# Patient Record
Sex: Male | Born: 1935 | Race: White | Hispanic: No | Smoking: Former smoker
Health system: Southern US, Community
[De-identification: ages and names within clinical notes are randomized; demographics above are authoritative.]

## PROBLEM LIST (undated history)

## (undated) DIAGNOSIS — Z85828 Personal history of other malignant neoplasm of skin: Secondary | ICD-10-CM

## (undated) DIAGNOSIS — C61 Malignant neoplasm of prostate: Secondary | ICD-10-CM

## (undated) DIAGNOSIS — C4492 Squamous cell carcinoma of skin, unspecified: Secondary | ICD-10-CM

## (undated) DIAGNOSIS — Z9889 Other specified postprocedural states: Secondary | ICD-10-CM

## (undated) DIAGNOSIS — N529 Male erectile dysfunction, unspecified: Secondary | ICD-10-CM

## (undated) DIAGNOSIS — I10 Essential (primary) hypertension: Secondary | ICD-10-CM

## (undated) DIAGNOSIS — K219 Gastro-esophageal reflux disease without esophagitis: Secondary | ICD-10-CM

## (undated) DIAGNOSIS — Z8679 Personal history of other diseases of the circulatory system: Secondary | ICD-10-CM

## (undated) DIAGNOSIS — C4491 Basal cell carcinoma of skin, unspecified: Secondary | ICD-10-CM

## (undated) HISTORY — DX: Squamous cell carcinoma of skin, unspecified: C44.92

## (undated) HISTORY — PX: CATARACT EXTRACTION W/ INTRAOCULAR LENS  IMPLANT, BILATERAL: SHX1307

## (undated) HISTORY — DX: Gastro-esophageal reflux disease without esophagitis: K21.9

## (undated) HISTORY — DX: Essential (primary) hypertension: I10

## (undated) HISTORY — PX: APPENDECTOMY: SHX54

## (undated) HISTORY — DX: Basal cell carcinoma of skin, unspecified: C44.91

---

## 1978-07-15 HISTORY — PX: CHOLECYSTECTOMY: SHX55

## 1998-03-06 ENCOUNTER — Encounter: Admission: RE | Admit: 1998-03-06 | Discharge: 1998-06-04 | Payer: Self-pay | Admitting: Internal Medicine

## 1998-03-28 ENCOUNTER — Ambulatory Visit (HOSPITAL_COMMUNITY): Admission: RE | Admit: 1998-03-28 | Discharge: 1998-03-28 | Payer: Self-pay | Admitting: Ophthalmology

## 2001-02-11 ENCOUNTER — Other Ambulatory Visit: Admission: RE | Admit: 2001-02-11 | Discharge: 2001-02-11 | Payer: Self-pay | Admitting: Gastroenterology

## 2001-02-11 ENCOUNTER — Encounter (INDEPENDENT_AMBULATORY_CARE_PROVIDER_SITE_OTHER): Payer: Self-pay | Admitting: Specialist

## 2004-05-23 ENCOUNTER — Ambulatory Visit: Payer: Self-pay | Admitting: Internal Medicine

## 2004-05-30 ENCOUNTER — Ambulatory Visit: Payer: Self-pay | Admitting: Internal Medicine

## 2004-11-14 ENCOUNTER — Ambulatory Visit: Payer: Self-pay | Admitting: Internal Medicine

## 2005-02-14 ENCOUNTER — Ambulatory Visit: Payer: Self-pay | Admitting: Internal Medicine

## 2005-04-18 ENCOUNTER — Ambulatory Visit: Payer: Self-pay | Admitting: Internal Medicine

## 2005-04-26 ENCOUNTER — Ambulatory Visit: Payer: Self-pay | Admitting: Internal Medicine

## 2005-08-26 ENCOUNTER — Ambulatory Visit: Payer: Self-pay | Admitting: Internal Medicine

## 2005-11-20 ENCOUNTER — Ambulatory Visit: Payer: Self-pay | Admitting: Internal Medicine

## 2006-01-30 ENCOUNTER — Ambulatory Visit: Payer: Self-pay | Admitting: Internal Medicine

## 2006-04-23 ENCOUNTER — Ambulatory Visit: Payer: Self-pay | Admitting: Gastroenterology

## 2006-05-08 ENCOUNTER — Ambulatory Visit: Payer: Self-pay | Admitting: Internal Medicine

## 2006-05-09 ENCOUNTER — Ambulatory Visit: Payer: Self-pay | Admitting: Gastroenterology

## 2006-08-14 ENCOUNTER — Ambulatory Visit: Payer: Self-pay | Admitting: Internal Medicine

## 2006-08-14 LAB — CONVERTED CEMR LAB
ALT: 23 units/L (ref 0–40)
AST: 22 units/L (ref 0–37)
Albumin: 3.8 g/dL (ref 3.5–5.2)
Calcium: 8.9 mg/dL (ref 8.4–10.5)
Chloride: 105 meq/L (ref 96–112)
Cholesterol: 189 mg/dL (ref 0–200)
GFR calc non Af Amer: 64 mL/min
LDL Cholesterol: 128 mg/dL — ABNORMAL HIGH (ref 0–99)
VLDL: 28 mg/dL (ref 0–40)

## 2007-01-30 DIAGNOSIS — I10 Essential (primary) hypertension: Secondary | ICD-10-CM | POA: Insufficient documentation

## 2007-02-11 ENCOUNTER — Ambulatory Visit: Payer: Self-pay | Admitting: Internal Medicine

## 2007-02-11 LAB — CONVERTED CEMR LAB: Hgb A1c MFr Bld: 6.8 % — ABNORMAL HIGH (ref 4.6–6.0)

## 2007-06-04 ENCOUNTER — Ambulatory Visit: Payer: Self-pay | Admitting: Internal Medicine

## 2007-07-15 ENCOUNTER — Ambulatory Visit: Payer: Self-pay | Admitting: Internal Medicine

## 2007-07-15 LAB — CONVERTED CEMR LAB
ALT: 22 units/L (ref 0–53)
Bilirubin, Direct: 0.2 mg/dL (ref 0.0–0.3)
Calcium: 9 mg/dL (ref 8.4–10.5)
Creatinine,U: 104.9 mg/dL
Eosinophils Absolute: 0.7 10*3/uL — ABNORMAL HIGH (ref 0.0–0.6)
Eosinophils Relative: 7.9 % — ABNORMAL HIGH (ref 0.0–5.0)
GFR calc Af Amer: 77 mL/min
GFR calc non Af Amer: 63 mL/min
Glucose, Bld: 157 mg/dL — ABNORMAL HIGH (ref 70–99)
Glucose, Urine, Semiquant: NEGATIVE
HDL: 27.6 mg/dL — ABNORMAL LOW (ref >39.0)
Hgb A1c MFr Bld: 7.1 % — ABNORMAL HIGH (ref 4.6–6.0)
Lymphocytes Relative: 22.8 % (ref 12.0–46.0)
MCV: 93.5 fL (ref 78.0–100.0)
Microalb Creat Ratio: 73.4 mg/g — ABNORMAL HIGH (ref 0.0–30.0)
Microalb, Ur: 7.7 mg/dL — ABNORMAL HIGH (ref 0.0–1.9)
Monocytes Relative: 9 % (ref 3.0–11.0)
Neutro Abs: 4.9 10*3/uL (ref 1.4–7.7)
Platelets: 262 10*3/uL (ref 150–400)
Potassium: 4.6 meq/L (ref 3.5–5.1)
Sodium: 142 meq/L (ref 135–145)
Specific Gravity, Urine: 1.02
Triglycerides: 113 mg/dL (ref 0–149)
Urobilinogen, UA: 0.2
WBC: 8.3 10*3/uL (ref 4.5–10.5)
pH: 5.5

## 2007-07-22 ENCOUNTER — Ambulatory Visit: Payer: Self-pay | Admitting: Internal Medicine

## 2007-10-21 ENCOUNTER — Ambulatory Visit: Payer: Self-pay | Admitting: Internal Medicine

## 2007-11-05 ENCOUNTER — Ambulatory Visit: Payer: Self-pay | Admitting: Internal Medicine

## 2007-11-05 DIAGNOSIS — K219 Gastro-esophageal reflux disease without esophagitis: Secondary | ICD-10-CM | POA: Insufficient documentation

## 2007-11-05 DIAGNOSIS — R0789 Other chest pain: Secondary | ICD-10-CM

## 2007-11-17 ENCOUNTER — Encounter: Payer: Self-pay | Admitting: Internal Medicine

## 2007-11-17 ENCOUNTER — Ambulatory Visit: Payer: Self-pay

## 2007-12-11 ENCOUNTER — Ambulatory Visit: Payer: Self-pay | Admitting: Internal Medicine

## 2007-12-11 DIAGNOSIS — M47812 Spondylosis without myelopathy or radiculopathy, cervical region: Secondary | ICD-10-CM | POA: Insufficient documentation

## 2008-01-20 ENCOUNTER — Ambulatory Visit: Payer: Self-pay | Admitting: Internal Medicine

## 2008-01-20 DIAGNOSIS — M25519 Pain in unspecified shoulder: Secondary | ICD-10-CM | POA: Insufficient documentation

## 2008-01-20 LAB — CONVERTED CEMR LAB
BUN: 14 mg/dL (ref 6–23)
CO2: 31 meq/L (ref 19–32)
Calcium: 9 mg/dL (ref 8.4–10.5)
Creatinine, Ser: 1.3 mg/dL (ref 0.4–1.5)
Glucose, Bld: 75 mg/dL (ref 70–99)
Hgb A1c MFr Bld: 6.4 % — ABNORMAL HIGH (ref 4.6–6.0)

## 2008-02-10 ENCOUNTER — Telehealth: Payer: Self-pay | Admitting: Internal Medicine

## 2008-04-21 ENCOUNTER — Ambulatory Visit: Payer: Self-pay | Admitting: Internal Medicine

## 2008-04-21 LAB — CONVERTED CEMR LAB: Hgb A1c MFr Bld: 6.6 % — ABNORMAL HIGH (ref 4.6–6.0)

## 2008-06-22 ENCOUNTER — Telehealth: Payer: Self-pay | Admitting: Internal Medicine

## 2008-06-29 ENCOUNTER — Ambulatory Visit: Payer: Self-pay | Admitting: Internal Medicine

## 2008-07-27 ENCOUNTER — Ambulatory Visit: Payer: Self-pay | Admitting: Internal Medicine

## 2008-07-27 DIAGNOSIS — F0150 Vascular dementia without behavioral disturbance: Secondary | ICD-10-CM | POA: Insufficient documentation

## 2008-08-02 ENCOUNTER — Ambulatory Visit: Payer: Self-pay | Admitting: Internal Medicine

## 2008-08-02 ENCOUNTER — Inpatient Hospital Stay (HOSPITAL_COMMUNITY): Admission: EM | Admit: 2008-08-02 | Discharge: 2008-08-05 | Payer: Self-pay | Admitting: Emergency Medicine

## 2008-08-02 ENCOUNTER — Ambulatory Visit: Payer: Self-pay | Admitting: Cardiovascular Disease

## 2008-08-03 ENCOUNTER — Encounter (INDEPENDENT_AMBULATORY_CARE_PROVIDER_SITE_OTHER): Payer: Self-pay | Admitting: Internal Medicine

## 2008-08-03 ENCOUNTER — Ambulatory Visit: Payer: Self-pay | Admitting: Vascular Surgery

## 2008-08-09 ENCOUNTER — Encounter: Admission: RE | Admit: 2008-08-09 | Discharge: 2008-08-09 | Payer: Self-pay | Admitting: Neurosurgery

## 2008-08-09 ENCOUNTER — Encounter: Payer: Self-pay | Admitting: Internal Medicine

## 2008-08-10 ENCOUNTER — Ambulatory Visit: Payer: Self-pay | Admitting: Internal Medicine

## 2008-08-10 DIAGNOSIS — S065X9A Traumatic subdural hemorrhage with loss of consciousness of unspecified duration, initial encounter: Secondary | ICD-10-CM | POA: Insufficient documentation

## 2008-08-10 DIAGNOSIS — H832X9 Labyrinthine dysfunction, unspecified ear: Secondary | ICD-10-CM | POA: Insufficient documentation

## 2008-09-22 ENCOUNTER — Encounter: Payer: Self-pay | Admitting: Internal Medicine

## 2008-09-22 ENCOUNTER — Encounter: Admission: RE | Admit: 2008-09-22 | Discharge: 2008-09-22 | Payer: Self-pay | Admitting: Neurosurgery

## 2008-10-26 ENCOUNTER — Ambulatory Visit: Payer: Self-pay | Admitting: Internal Medicine

## 2008-10-26 LAB — CONVERTED CEMR LAB
Basophils Relative: 2.1 % (ref 0.0–3.0)
CO2: 31 meq/L (ref 19–32)
Chloride: 108 meq/L (ref 96–112)
Eosinophils Relative: 5.4 % — ABNORMAL HIGH (ref 0.0–5.0)
HCT: 41.4 % (ref 39.0–52.0)
Hgb A1c MFr Bld: 6.7 % — ABNORMAL HIGH (ref 4.6–6.5)
Lymphs Abs: 1.4 10*3/uL (ref 0.7–4.0)
MCV: 90 fL (ref 78.0–100.0)
Monocytes Absolute: 0.5 10*3/uL (ref 0.1–1.0)
Monocytes Relative: 10.4 % (ref 3.0–12.0)
Neutrophils Relative %: 55.2 % (ref 43.0–77.0)
RBC: 4.6 M/uL (ref 4.22–5.81)
Sodium: 144 meq/L (ref 135–145)
WBC: 5.2 10*3/uL (ref 4.5–10.5)

## 2008-11-14 ENCOUNTER — Telehealth: Payer: Self-pay | Admitting: Internal Medicine

## 2008-11-14 ENCOUNTER — Encounter: Admission: RE | Admit: 2008-11-14 | Discharge: 2008-11-24 | Payer: Self-pay | Admitting: Internal Medicine

## 2008-12-27 ENCOUNTER — Encounter: Admission: RE | Admit: 2008-12-27 | Discharge: 2008-12-27 | Payer: Self-pay | Admitting: Neurosurgery

## 2008-12-28 ENCOUNTER — Encounter: Payer: Self-pay | Admitting: Internal Medicine

## 2009-01-18 ENCOUNTER — Ambulatory Visit: Payer: Self-pay | Admitting: Internal Medicine

## 2009-04-27 ENCOUNTER — Ambulatory Visit: Payer: Self-pay | Admitting: Internal Medicine

## 2009-05-03 ENCOUNTER — Ambulatory Visit: Payer: Self-pay | Admitting: Internal Medicine

## 2009-05-03 DIAGNOSIS — M259 Joint disorder, unspecified: Secondary | ICD-10-CM | POA: Insufficient documentation

## 2009-05-03 DIAGNOSIS — C4491 Basal cell carcinoma of skin, unspecified: Secondary | ICD-10-CM

## 2009-09-22 ENCOUNTER — Ambulatory Visit: Payer: Self-pay | Admitting: Internal Medicine

## 2009-09-22 DIAGNOSIS — J189 Pneumonia, unspecified organism: Secondary | ICD-10-CM

## 2009-10-02 ENCOUNTER — Ambulatory Visit: Payer: Self-pay | Admitting: Internal Medicine

## 2009-10-02 LAB — CONVERTED CEMR LAB
CO2: 29 meq/L (ref 19–32)
Calcium: 8.8 mg/dL (ref 8.4–10.5)
Chloride: 106 meq/L (ref 96–112)
Glucose, Bld: 131 mg/dL — ABNORMAL HIGH (ref 70–99)
Sodium: 141 meq/L (ref 135–145)

## 2009-12-25 ENCOUNTER — Ambulatory Visit: Payer: Self-pay | Admitting: Internal Medicine

## 2009-12-25 LAB — CONVERTED CEMR LAB
BUN: 14 mg/dL (ref 6–23)
Basophils Absolute: 0 10*3/uL (ref 0.0–0.1)
Basophils Relative: 0.6 % (ref 0.0–3.0)
CO2: 31 meq/L (ref 19–32)
Eosinophils Absolute: 0.2 10*3/uL (ref 0.0–0.7)
GFR calc non Af Amer: 61.13 mL/min (ref >60)
Glucose, Bld: 131 mg/dL — ABNORMAL HIGH (ref 70–99)
HCT: 44.6 % (ref 39.0–52.0)
Hemoglobin: 15.6 g/dL (ref 13.0–17.0)
Lymphs Abs: 1.7 10*3/uL (ref 0.7–4.0)
MCHC: 34.9 g/dL (ref 30.0–36.0)
MCV: 93.9 fL (ref 78.0–100.0)
Monocytes Absolute: 0.6 10*3/uL (ref 0.1–1.0)
Neutro Abs: 3.5 10*3/uL (ref 1.4–7.7)
Potassium: 4.8 meq/L (ref 3.5–5.1)
RBC: 4.75 M/uL (ref 4.22–5.81)
RDW: 13.7 % (ref 11.5–14.6)
Sodium: 143 meq/L (ref 135–145)

## 2010-01-30 ENCOUNTER — Encounter: Payer: Self-pay | Admitting: Internal Medicine

## 2010-04-30 ENCOUNTER — Ambulatory Visit: Payer: Self-pay | Admitting: Internal Medicine

## 2010-04-30 DIAGNOSIS — R252 Cramp and spasm: Secondary | ICD-10-CM

## 2010-05-14 ENCOUNTER — Encounter: Payer: Self-pay | Admitting: Internal Medicine

## 2010-05-29 ENCOUNTER — Telehealth: Payer: Self-pay | Admitting: Internal Medicine

## 2010-07-31 ENCOUNTER — Ambulatory Visit
Admission: RE | Admit: 2010-07-31 | Discharge: 2010-07-31 | Payer: Self-pay | Source: Home / Self Care | Attending: Internal Medicine | Admitting: Internal Medicine

## 2010-07-31 ENCOUNTER — Other Ambulatory Visit: Payer: Self-pay | Admitting: Internal Medicine

## 2010-07-31 DIAGNOSIS — B351 Tinea unguium: Secondary | ICD-10-CM | POA: Insufficient documentation

## 2010-07-31 DIAGNOSIS — E119 Type 2 diabetes mellitus without complications: Secondary | ICD-10-CM | POA: Insufficient documentation

## 2010-07-31 LAB — BASIC METABOLIC PANEL
BUN: 10 mg/dL (ref 6–23)
CO2: 28 mEq/L (ref 19–32)
Calcium: 9 mg/dL (ref 8.4–10.5)
Chloride: 103 mEq/L (ref 96–112)
Creatinine, Ser: 1.3 mg/dL (ref 0.4–1.5)
GFR: 58.81 mL/min — ABNORMAL LOW (ref >60.00)
Glucose, Bld: 188 mg/dL — ABNORMAL HIGH (ref 70–99)
Potassium: 4.2 mEq/L (ref 3.5–5.1)
Sodium: 139 mEq/L (ref 135–145)

## 2010-07-31 LAB — MICROALBUMIN / CREATININE URINE RATIO
Creatinine,U: 200.6 mg/dL
Microalb Creat Ratio: 15.6 mg/g (ref 0.0–30.0)
Microalb, Ur: 31.3 mg/dL — ABNORMAL HIGH (ref 0.0–1.9)

## 2010-07-31 LAB — HEMOGLOBIN A1C: Hgb A1c MFr Bld: 7.5 % — ABNORMAL HIGH (ref 4.6–6.5)

## 2010-08-05 ENCOUNTER — Encounter: Payer: Self-pay | Admitting: Neurosurgery

## 2010-08-14 NOTE — Assessment & Plan Note (Signed)
Summary: 4 month rov/njr   Vital Signs:  Patient profile:   77 year old male Height:      71 inches Weight:      190 pounds BMI:     26.60 Temp:     98.2 degrees F oral Pulse rate:   72 / minute Resp:     14 per minute BP sitting:   130 / 70  (left arm)  Vitals Entered By: Willy Eddy, LPN (April 30, 2010 11:58 AM) CC: roa- cbg's running around 110, Hypertension Management Is Patient Diabetic? Yes Did you bring your meter with you today? No   Primary Care Provider:  Stacie Glaze MD  CC:  roa- cbg's running around 110 and Hypertension Management.  History of Present Illness: The pts blood glucoses have been in the 110 range and he feels well No chest pains, no swelling of legs the pt has noted cramps in his calves at night that awaken him from sleep his blood pressure is well controlled his potassium is normal no exertional cramping  Hypertension History:      He denies headache, chest pain, palpitations, dyspnea with exertion, orthopnea, PND, peripheral edema, visual symptoms, neurologic problems, syncope, and side effects from treatment.        Positive major cardiovascular risk factors include male age 16 years old or older, diabetes, and hypertension.  Negative major cardiovascular risk factors include non-tobacco-user status.     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) 6)  Memory Loss  (ICD-780.93) 7)  Need Prophylactic Vaccination&inoculation Flu  (ICD-V04.81) 8)  Shoulder Pain, Left, Chronic  (ICD-719.41) 9)  Cervical Spondylosis Without Myelopathy  (ICD-721.0) 10)  Chest Pain, Atypical  (ICD-786.59) 11)  Gerd   (ICD-530.81) 12)  Physical Examination  (ICD-V70.0) 13)  Diabetes Mellitus, Type II  (ICD-250.00) 14)  Family History Diabetes 1st Degree Relative  (ICD-V18.0) 15)  Hypertension  (ICD-401.9)  Current Problems (verified): 1)  Pneumonia, Right Middle Lobe  (ICD-486) 2)  Actinic Keratosis, Forehead, Left  (ICD-702.0) 3)  Unspecified Disorder of Shoulder Joint  (ICD-719.91) 4)  Unspecified Labyrinthine Dysfunction  (ICD-386.50) 5)  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 Once Daily 2)  Adult Aspirin Ec Low Strength 81 Mg  Tbec (Aspirin) .... Once Daily 3)  Altace 2.5 Mg  Caps (Ramipril) .Marland Kitchen.. 1 Twice Daily 4)  Celebrex 200 Mg  Caps (Celecoxib) .... One By Mouth Daily As Needed 5)  Accu-Chek Aviva  Strp (Glucose Blood) .Marland Kitchen.. 1 Two Times A Day 6)  Ranitidine Hcl 300 Mg Caps (Ranitidine Hcl) .Marland Kitchen.. 1 Once Daily 7)  Accu-Chek Soft Touch Lancets  Misc (Lancets) .Marland KitchenMarland KitchenMarland Kitchen  1 Two Times A Day 8)  Delsym 30 Mg/11ml Lqcr (Dextromethorphan Polistirex) .... Two Tsp By Mouth Two Times A Day For The Next Week  Current Medications (verified): 1)  Glucotrol Xl 5 Mg  Tb24 (Glipizide) .... Once Daily 2)  Adult Aspirin Ec Low Strength 81 Mg  Tbec (Aspirin) .... Once Daily 3)  Altace 2.5 Mg  Caps (Ramipril) .Marland Kitchen.. 1 Twice Daily 4)  Celebrex 200 Mg  Caps (Celecoxib) .... One By Mouth Daily As Needed 5)  Accu-Chek Aviva  Strp (Glucose Blood) .Marland Kitchen.. 1 Two Times A Day 6)  Ranitidine Hcl 300 Mg  Caps (Ranitidine Hcl) .Marland Kitchen.. 1 Once Daily 7)  Accu-Chek Soft Touch Lancets  Misc (Lancets) .Marland Kitchen.. 1 Two Times A Day 8)  Delsym 30 Mg/70ml Lqcr (Dextromethorphan Polistirex) .... Two Tsp By Mouth Two Times A Day For The Next Week  Allergies (verified): No Known Drug Allergies  Past History:  Family History: Last updated: 01/30/2007 Family History Diabetes 1st degree relative Family History Lung cancer  Social History: Last updated: 01/30/2007 Former Smoker Alcohol use-no Married  Risk Factors: Exercise: no (07/22/2007)  Risk Factors: Smoking Status: quit (04/30/2010)  Past medical, surgical, family and social histories (including risk factors) reviewed, and no changes noted (except as noted below).  Past Medical History: Reviewed history from 11/05/2007 and no changes required. Hypertension Diabetes mellitus, type II GERD  Past Surgical History: Reviewed history from 01/30/2007 and no changes required. Appendectomy Cholecystectomy Colonoscopy-05/09/2006  Family History: Reviewed history from 01/30/2007 and no changes required. Family History Diabetes 1st degree relative Family History Lung cancer  Social History: Reviewed history from 01/30/2007 and no changes required. Former Smoker Alcohol use-no Married  Review of Systems  The patient denies anorexia, fever, weight loss, weight gain, vision loss, decreased hearing, hoarseness, chest pain, syncope, dyspnea on exertion, peripheral edema, prolonged cough, headaches, hemoptysis, abdominal pain, melena, hematochezia, severe indigestion/heartburn, hematuria, incontinence, genital sores, muscle weakness, suspicious skin lesions, transient blindness, difficulty walking, depression, unusual weight change, abnormal bleeding, enlarged lymph nodes, angioedema, breast masses, and testicular masses.         Flu Vaccine Consent Questions     Do you have a history of severe allergic reactions to this vaccine? no    Any prior  history of allergic reactions to egg and/or gelatin? no    Do you have a sensitivity to the preservative Thimersol? no    Do you have a past history of Guillan-Barre Syndrome? no    Do you currently have an acute febrile illness? no    Have you ever had a severe reaction to latex? no    Vaccine information given and explained to patient? yes    Are you currently pregnant? no    Lot Number:[id number]   Exp Date:01/12/2011   Site Given  Left Deltoid IM   Physical Exam  General:  alert and well-developed.   Head:  normocephalic and atraumatic.   Eyes:  pupils equal and pupils round.   Ears:  R ear normal and L ear normal.   Nose:  no external deformity and no nasal discharge.   Neck:  No deformities, masses, or tenderness noted. Lungs:  normal respiratory effort and no wheezes.   Heart:  normal rate and regular rhythm.   Msk:  normal ROM and no joint instability.    Diabetes Management Exam:    Foot Exam (with socks and/or shoes not present):       Sensory-Pinprick/Light touch:  Left medial foot (L-4): normal          Left dorsal foot (L-5): normal          Left lateral foot (S-1): normal          Right medial foot (L-4): normal          Right dorsal foot (L-5): normal          Right lateral foot (S-1): normal       Sensory-Monofilament:          Left foot: normal          Right foot: normal       Inspection:          Left foot: normal          Right foot: normal       Nails:          Left foot: normal          Right foot: normal    Eye Exam:       Eye Exam done elsewhere          Date: 05/08/2010          Results: normal          Done by: Zoila Shutter   Impression & Recommendations:  Problem # 1:  LEG CRAMPS, NOCTURNAL (ICD-729.82) potassium is goo and the DM is controlled  will add elavil 10 mg see if this is related to DM circulation  normal  Problem # 2:  MEMORY LOSS (ICD-780.93) stable  Problem # 3:  GERD (ICD-530.81) Assessment: Unchanged  His  updated medication list for this problem includes:    Ranitidine Hcl 300 Mg Caps (Ranitidine hcl) .Marland Kitchen... 1 once daily  Labs Reviewed: Hgb: 15.6 (12/25/2009)   Hct: 44.6 (12/25/2009)  Problem # 4:  DIABETES MELLITUS, TYPE II (ICD-250.00)  His updated medication list for this problem includes:    Glucotrol Xl 5 Mg Tb24 (Glipizide) ..... Once daily    Adult Aspirin Ec Low Strength 81 Mg Tbec (Aspirin) ..... Once daily    Altace 2.5 Mg Caps (Ramipril) .Marland Kitchen... 1 twice daily  Labs Reviewed: Creat: 1.2 (12/25/2009)    Reviewed HgBA1c results: 6.7 (12/25/2009)  6.7 (10/02/2009)  Problem # 5:  HYPERTENSION (ICD-401.9)  His updated medication list for this problem includes:    Altace 2.5 Mg Caps (Ramipril) .Marland Kitchen... 1 twice daily  BP today: 130/70 Prior BP: 130/80 (12/25/2009)  Prior 10 Yr Risk Heart Disease: 47 % (05/03/2009)  Labs Reviewed: K+: 4.8 (12/25/2009) Creat: : 1.2 (12/25/2009)   Chol: 172 (07/15/2007)   HDL: 27.6 (07/15/2007)   LDL: 122 (07/15/2007)   TG: 113 (07/15/2007)  Complete Medication List: 1)  Glucotrol Xl 5 Mg Tb24 (Glipizide) .... Once daily 2)  Adult Aspirin Ec Low Strength 81 Mg Tbec (Aspirin) .... Once daily 3)  Altace 2.5 Mg Caps (Ramipril) .Marland Kitchen.. 1 twice daily 4)  Celebrex 200 Mg Caps (Celecoxib) .... One by mouth daily as needed 5)  Accu-chek Aviva Strp (Glucose blood) .Marland Kitchen.. 1 two times a day 6)  Ranitidine Hcl 300 Mg Caps (Ranitidine hcl) .Marland Kitchen.. 1 once daily 7)  Accu-chek Soft Touch Lancets Misc (Lancets) .Marland Kitchen.. 1 two times a day 8)  Amitriptyline Hcl 10 Mg Tabs (Amitriptyline hcl) .... One by mouth q hs  Other Orders: Flu Vaccine 53yrs + MEDICARE PATIENTS (Q6578) Administration Flu vaccine - MCR (I6962)  Hypertension Assessment/Plan:      The patient's hypertensive risk group is category C:  Target organ damage and/or diabetes.  His calculated 10 year risk of coronary heart disease is 47 %.  Today's blood pressure is 130/70.  His blood pressure goal is <  130/80.  Patient Instructions: 1)  Please schedule a follow-up appointment in 3 months. Prescriptions: AMITRIPTYLINE HCL 10 MG TABS (AMITRIPTYLINE HCL) one by mouth q HS  #30 x 11   Entered and Authorized by:   Stacie Glaze MD   Signed by:   Stacie Glaze MD on 04/30/2010   Method used:   Electronically to        [facility]  [phone]* (retail)       [address] [place], [place]  [address]       Ph: [phone] or [phone]       Fax: [phone]   RxID:   [id number]    Orders Added: 1)  Flu Vaccine 29yrs + MEDICARE PATIENTS [Q2039] 2)  Administration Flu vaccine - MCR [G0008] 3)  Est. Patient Level IV [36644]

## 2010-08-14 NOTE — Assessment & Plan Note (Signed)
Summary: COUGH, CONGESTION, FEVER // RS   Vital Signs:  Patient profile:   77 year old male Height:      71 inches Weight:      192 pounds BMI:     26.88 Temp:     100.1 degrees F oral Pulse rate:   72 / minute Resp:     16 per minute BP sitting:   130 / 80  (left arm)  Vitals Entered By: Willy Eddy, LPN (September 22, 2009 10:01 AM) CC: c/o produtive cough, with burning in chest- chils, sweats and fever for 4 days, URI symptoms   CC:  c/o produtive cough, with burning in chest- chils, sweats and fever for 4 days, and URI symptoms.  History of Present Illness:  URI Symptoms      This is a 77 year old man who presents with URI symptoms.  started this week ( had the flu shot) fever and hard chills with productive cough with nausea. risks are DM.  The patient reports sore throat and dry cough, but denies nasal congestion, clear nasal discharge, purulent nasal discharge, productive cough, earache, and sick contacts.  Associated symptoms include fever of 100.5-103 degrees.  The patient also reports headache, muscle aches, and severe fatigue.    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  (ICD-852.23) 5)  Memory Loss  (ICD-780.93) 6)  Need Prophylactic Vaccination&inoculation Flu  (ICD-V04.81) 7)  Shoulder Pain, Left, Chronic  (ICD-719.41) 8)  Cervical Spondylosis Without Myelopathy  (ICD-721.0) 9)  Chest Pain, Atypical  (ICD-786.59) 10)  Gerd  (ICD-530.81) 11)  Physical Examination  (ICD-V70.0) 12)  Diabetes Mellitus, Type II  (ICD-250.00) 13)  Family History Diabetes 1st Degree Relative  (ICD-V18.0) 14)  Hypertension  (ICD-401.9)  Current Problems (verified): 1)  Actinic Keratosis, Forehead,  Left  (ICD-702.0) 2)  Unspecified Disorder of Shoulder Joint  (ICD-719.91) 3)  Unspecified Labyrinthine Dysfunction  (ICD-386.50) 4)  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 Once Daily 2)  Adult Aspirin Ec Low Strength 81 Mg  Tbec (Aspirin) .... Once Daily 3)  Altace 2.5 Mg  Caps (Ramipril) .Marland Kitchen.. 1 Twice Daily 4)  Freestyle Lite   Strp (Glucose Blood) .... Test Twice Daily Before Breakfast and Supper   ( 250.82) 5)  Celebrex 200 Mg  Caps (Celecoxib) .... One By Mouth Daily As Needed 6)  Accu-Chek Aviva  Strp (Glucose Blood) .Marland Kitchen.. 1 Two Times A Day 7)  Ranitidine Hcl 300 Mg Caps (Ranitidine Hcl) .Marland Kitchen.. 1 Once Daily  Current Medications (verified): 1)  Glucotrol Xl 5 Mg  Tb24 (Glipizide) .... Once Daily 2)  Adult Aspirin Ec Low Strength 81 Mg  Tbec (Aspirin) .... Once Daily 3)  Altace 2.5  Mg  Caps (Ramipril) .Marland Kitchen.. 1 Twice Daily 4)  Freestyle Lite   Strp (Glucose Blood) .... Test Twice Daily Before Breakfast and Supper   ( 250.82) 5)  Celebrex 200 Mg  Caps (Celecoxib) .... One By Mouth Daily As Needed 6)  Accu-Chek Aviva  Strp (Glucose Blood) .Marland Kitchen.. 1 Two Times A Day 7)  Ranitidine Hcl 300 Mg Caps (Ranitidine Hcl) .Marland Kitchen.. 1 Once Daily  Allergies (verified): No Known Drug Allergies  Past History:  Family History: Last updated: 01/30/2007 Family History Diabetes 1st degree relative Family History Lung cancer  Social History: Last updated: 01/30/2007 Former  Smoker Alcohol use-no Married  Risk Factors: Exercise: no (07/22/2007)  Risk Factors: Smoking Status: quit (09/22/2009)  Past medical, surgical, family and social histories (including risk factors) reviewed, and no changes noted (except as noted below).  Past Medical History: Reviewed history from 11/05/2007 and no changes required. Hypertension Diabetes mellitus, type II GERD  Past Surgical History: Reviewed history from 01/30/2007 and no changes required. Appendectomy Cholecystectomy Colonoscopy-05/09/2006  Family History: Reviewed history from 01/30/2007 and no changes required. Family History Diabetes 1st degree relative Family History Lung cancer  Social History: Reviewed history from 01/30/2007 and no changes required. Former Smoker Alcohol use-no Married  Review of Systems  The patient denies anorexia, fever, weight loss, weight gain, vision loss, decreased hearing, hoarseness, chest pain, syncope, dyspnea on exertion, peripheral edema, prolonged cough, headaches, hemoptysis, abdominal pain, melena, hematochezia, severe indigestion/heartburn, hematuria, incontinence, genital sores, muscle weakness, suspicious skin lesions, transient blindness, difficulty walking, depression, unusual weight change, abnormal bleeding, enlarged lymph nodes, angioedema, and breast masses.    Physical Exam  General:  alert and well-developed.   Head:  normocephalic and atraumatic.   Eyes:  pupils equal and pupils round.   Ears:  R ear normal and L ear normal.   Nose:  no external deformity and no nasal discharge.   Neck:  No deformities, masses, or tenderness noted. Lungs:  R base dullness, R base crackles, and R wheezes.   Heart:  normal rate and regular rhythm.   Abdomen:  soft and non-tender.   Msk:  normal ROM and no joint instability.     Impression & Recommendations:  Problem # 1:  PNEUMONIA, RIGHT MIDDLE LOBE (ICD-486)  add mucinex Dm two times a day  and take all  antibiotic clinical pnemonia with right middle lobe involvement   nstructed patient to complete antibiotics, and call for worsened shortness of breath or new symptoms.   His updated medication list for this problem includes:    Suprax 400 Mg Tabs (Cefixime) ..... One by mouth daily for 10 days  Orders: Prescription Created Electronically [phone])  Problem # 2:  DIABETES MELLITUS, TYPE II (ICD-250.00)  increasded risk His updated medication list for this problem includes:    Glucotrol Xl 5 Mg Tb24 (Glipizide) ..... Once daily    Adult Aspirin Ec Low Strength 81 Mg Tbec (Aspirin) ..... Once daily    Altace 2.5 Mg Caps (Ramipril) .Marland Kitchen... 1 twice daily  Labs Reviewed: Creat: 1.3 (10/26/2008)    Reviewed HgBA1c results: 7.2 (05/03/2009)  6.7 (10/26/2008)  Complete Medication List: 1)  Glucotrol Xl 5 Mg Tb24 (Glipizide) .... Once daily 2)  Adult Aspirin Ec Low Strength 81 Mg Tbec (Aspirin) .... Once daily 3)  Altace 2.5 Mg Caps (Ramipril) .Marland Kitchen.. 1 twice daily 4)  Freestyle Lite Strp (Glucose blood) .... Test twice daily before breakfast and supper   ( 250.82) 5)  Celebrex 200 Mg Caps (  Celecoxib) .... One by mouth daily as needed 6)  Accu-chek Aviva Strp (Glucose blood) .Marland Kitchen.. 1 two times a day 7)  Ranitidine Hcl 300 Mg Caps (Ranitidine hcl) .Marland Kitchen.. 1 once daily 8)  Suprax 400 Mg Tabs (Cefixime) .... One by mouth daily for 10 days  Patient Instructions: 1)  you got a shot of antibiotic and a prescription was sent in  2)  take mucinex DM two time a day and you may still use nyguil ( recall tha many nyguil product already have tylenol in them 3)  Rest fluids and soups and bread and crackers for 2 days Prescriptions: SUPRAX 400 MG TABS (CEFIXIME) one by mouth daily for 10 days  #10 x 0   Entered and Authorized by:   Stacie Glaze MD   Signed by:   Stacie Glaze MD on 09/22/2009   Method used:   [facility] to        [facility]  [phone]* (retail)       [address] [place], [place]  [address]       Ph: [phone] or [phone]       Fax: [phone]   RxID:   [id number]   Appended Document: Orders Update    Clinical Lists Changes  Orders: Added new Service order of Rocephin  250mg  ([id number]) - Signed Added new Service order of Admin of Therapeutic Inj  intramuscular or subcutaneous (03474) - Signed         Medication Administration  Injection # 1:    Medication: Rocephin  250mg     Diagnosis: PNEUMONIA, RIGHT MIDDLE LOBE (ICD-486)    Route: IM    Site: RUOQ gluteus    Exp Date: 04/13/2012    Lot #: [id number]    Mfr: Nov[employer]is    Comments: 1gm given    Patient tolerated injection without complications    Given by: Willy Eddy, LPN (September 22, 2009 10:53 AM)  Orders Added: 1)  Rocephin  250mg  [[id number]] 2)  Admin of Therapeutic Inj  intramuscular or subcutaneous [[id number]]

## 2010-08-14 NOTE — Assessment & Plan Note (Signed)
Summary: 3 month fup//ccm [facility] BMP/NJR   Vital Signs:  Patient profile:   77 year old male Height:      71 inches Weight:      186 pounds BMI:     26.04 Temp:     98.2 degrees F oral Pulse rate:   76 / minute Resp:     14 per minute BP sitting:   130 / 80  (left arm)  Vitals Entered By: Willy Eddy, LPN (December 25, 2009 10:47 AM) CC: Hypertension Management   Visit Type:  roa  CC:  Hypertension Management.  History of Present Illness: follow up DM stable HTN stable review meds and appropriate labs  Hypertension History:      He denies headache, chest pain, palpitations, dyspnea with exertion, orthopnea, PND, peripheral edema, visual symptoms, neurologic problems, syncope, and side effects from treatment.        Positive major cardiovascular risk factors include male age 34 years old or older, diabetes, and hypertension.  Negative major cardiovascular risk factors include non-tobacco-user status.     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) 6)  Memory Loss  (ICD-780.93) 7)  Need Prophylactic Vaccination&inoculation Flu  (ICD-V04.81) 8)  Shoulder Pain, Left, Chronic  (ICD-719.41) 9)  Cervical Spondylosis Without Myelopathy  (ICD-721.0) 10)  Chest Pain, Atypical  (ICD-786.59) 11)  Gerd  (ICD-530.81) 12)  Physical Examination  (ICD-V70.0) 13)  Diabetes Mellitus, Type II  (ICD-250.00) 14)  Family History Diabetes 1st Degree Relative  (ICD-V18.0) 15)  Hypertension  (ICD-401.9)  Current Problems (verified): 1)  Pneumonia, Right Middle Lobe  (ICD-486) 2)  Actinic Keratosis, Forehead, Left  (ICD-702.0) 3)  Unspecified Disorder of Shoulder Joint  (ICD-719.91) 4)  Unspecified Labyrinthine Dysfunction  (ICD-386.50) 5)   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 Once Daily 2)  Adult Aspirin Ec Low Strength 81 Mg  Tbec (Aspirin) .... Once Daily 3)  Altace 2.5 Mg  Caps (Ramipril) .Marland Kitchen.. 1 Twice Daily 4)  Celebrex 200 Mg  Caps (Celecoxib) .... One By Mouth Daily As Needed 5)  Accu-Chek Aviva  Strp (Glucose Blood) .Marland Kitchen.. 1 Two Times A Day 6)  Ranitidine Hcl 300 Mg Caps (Ranitidine Hcl) .Marland Kitchen.. 1 Once Daily 7)  Accu-Chek Soft Touch Lancets  Misc (Lancets) .Marland Kitchen.. 1 Two Times A Day 8)  Delsym 30 Mg/36ml Lqcr (Dextromethorphan Polistirex) .... Two Tsp By Mouth Two Times A Day For The Next Week  Current Medications (verified): 1)  Glucotrol Xl 5 Mg  Tb24 (Glipizide) .... Once Daily 2)  Adult Aspirin Ec Low Strength 81 Mg  Tbec (Aspirin) .... Once Daily 3)  Altace 2.5 Mg  Caps (Ramipril) .Marland Kitchen.. 1 Twice Daily 4)  Celebrex 200 Mg  Caps (Celecoxib) .Marland KitchenMarland KitchenMarland Kitchen  One By Mouth Daily As Needed 5)  Accu-Chek Aviva  Strp (Glucose Blood) .Marland Kitchen.. 1 Two Times A Day 6)  Ranitidine Hcl 300 Mg Caps (Ranitidine Hcl) .Marland Kitchen.. 1 Once Daily 7)  Accu-Chek Soft Touch Lancets  Misc (Lancets) .Marland Kitchen.. 1 Two Times A Day 8)  Delsym 30 Mg/23ml Lqcr (Dextromethorphan Polistirex) .... Two Tsp By Mouth Two Times A Day For The Next Week  Allergies (verified): No Known Drug Allergies  Past History:  Family History: Last updated: 01/30/2007 Family History Diabetes 1st degree relative Family History Lung cancer  Social History: Last updated:  01/30/2007 Former Smoker Alcohol use-no Married  Risk Factors: Exercise: no (07/22/2007)  Risk Factors: Smoking Status: quit (10/02/2009)  Past medical, surgical, family and social histories (including risk factors) reviewed, and no changes noted (except as noted below).  Past Medical History: Reviewed history from 11/05/2007 and no changes required. Hypertension Diabetes mellitus, type II GERD  Past Surgical History: Reviewed history from 01/30/2007 and no changes required. Appendectomy Cholecystectomy Colonoscopy-05/09/2006  Family History: Reviewed history from 01/30/2007 and no changes required. Family History Diabetes 1st degree relative Family History Lung cancer  Social History: Reviewed history from 01/30/2007 and no changes required. Former Smoker Alcohol use-no Married  Review of Systems  The patient denies anorexia, fever, weight loss, weight gain, vision loss, decreased hearing, hoarseness, chest pain, syncope, dyspnea on exertion, peripheral edema, prolonged cough, headaches, hemoptysis, abdominal pain, melena, hematochezia, severe indigestion/heartburn, hematuria, incontinence, genital sores, muscle weakness, suspicious skin lesions, transient blindness, difficulty walking, depression, unusual weight change, abnormal bleeding, enlarged lymph nodes, angioedema, and breast masses.    Physical Exam  General:  alert and well-developed.   Head:  normocephalic and atraumatic.   Eyes:  pupils equal and pupils round.   Ears:  R ear normal and L ear normal.   Nose:  no external deformity and no nasal discharge.   Mouth:  pharynx pink and moist and no erythema.   Neck:  No deformities, masses, or tenderness noted. Lungs:  normal respiratory effort and no wheezes.   Heart:  normal rate and regular rhythm.     Impression & Recommendations:  Problem # 1:  DIABETES MELLITUS, TYPE II (ICD-250.00)  His updated medication list for this problem includes:     Glucotrol Xl 5 Mg Tb24 (Glipizide) ..... Once daily    Adult Aspirin Ec Low Strength 81 Mg Tbec (Aspirin) ..... Once daily    Altace 2.5 Mg Caps (Ramipril) .Marland Kitchen... 1 twice daily  Labs Reviewed: Creat: 1.2 (10/02/2009)    Reviewed HgBA1c results: 6.7 (10/02/2009)  7.2 (05/03/2009)  Orders: Venipuncture (91478) TLB-BMP (Basic Metabolic Panel-BMET) (80048-METABOL) TLB-A1C / Hgb A1C (Glycohemoglobin) (83036-A1C)  Problem # 2:  GERD (ICD-530.81)  His updated medication list for this problem includes:    Ranitidine Hcl 300 Mg Caps (Ranitidine hcl) .Marland Kitchen... 1 once daily  Orders: TLB-CBC Platelet - w/Differential (85025-CBCD)  Labs Reviewed: Hgb: 14.7 (10/26/2008)   Hct: 41.4 (10/26/2008)  Problem # 3:  HYPERTENSION (ICD-401.9)  His updated medication list for this problem includes:    Altace 2.5 Mg Caps (Ramipril) .Marland Kitchen... 1 twice daily  BP today: 130/80 Prior BP: 130/80 (10/02/2009)  Prior 10 Yr Risk Heart Disease: 47 % (05/03/2009)  Labs Reviewed: K+: 4.6 (10/02/2009) Creat: : 1.2 (10/02/2009)   Chol: 172 (07/15/2007)   HDL: 27.6 (07/15/2007)   LDL: 122 (07/15/2007)   TG: 113 (07/15/2007)  Complete Medication List: 1)  Glucotrol Xl 5 Mg Tb24 (Glipizide) .... Once daily 2)  Adult  Aspirin Ec Low Strength 81 Mg Tbec (Aspirin) .... Once daily 3)  Altace 2.5 Mg Caps (Ramipril) .Marland Kitchen.. 1 twice daily 4)  Celebrex 200 Mg Caps (Celecoxib) .... One by mouth daily as needed 5)  Accu-chek Aviva Strp (Glucose blood) .Marland Kitchen.. 1 two times a day 6)  Ranitidine Hcl 300 Mg Caps (Ranitidine hcl) .Marland Kitchen.. 1 once daily 7)  Accu-chek Soft Touch Lancets Misc (Lancets) .Marland Kitchen.. 1 two times a day 8)  Delsym 30 Mg/36ml Lqcr (Dextromethorphan polistirex) .... Two tsp by mouth two times a day for the next week  Hypertension Assessment/Plan:      The patient's hypertensive risk group is category C: Target organ damage and/or diabetes.  His calculated 10 year risk of coronary heart disease is 47 %.  Today's blood pressure is  130/80.  His blood pressure goal is < 130/80.  Patient Instructions: 1)  Please schedule a follow-up appointment in 4 months.

## 2010-08-14 NOTE — Progress Notes (Signed)
Summary: cataract surgery?  Phone Note Call from Patient   Caller: Patient Call For: Stacie Glaze MD Summary of Call: Pt is having cataract surgery, and the surgeon would like keep him off the ASA about a week.  What would Dr. Lovell Sheehan suggest?  [phone] Initial call taken by: Lynann Beaver CMA AAMA,  May 29, 2010 8:44 AM  Follow-up for Phone Call        may hold for 1 week prior-per dr Lovell Sheehan Follow-up by: Willy Eddy, LPN,  May 29, 2010 9:45 AM  Additional Follow-up for Phone Call Additional follow up Details #1::        Notified pt. Additional Follow-up by: Lynann Beaver CMA AAMA,  May 29, 2010 10:51 AM

## 2010-08-14 NOTE — Medication Information (Signed)
Summary: Order for Diabetic Supplies  Order for Diabetic Supplies   Imported By: Maryln Gottron 01/31/2010 14:44:22  _____________________________________________________________________  External Attachment:    Type:   Image     Comment:   External Document

## 2010-08-14 NOTE — Assessment & Plan Note (Signed)
Summary: 3 mo /mm   Vital Signs:  Patient profile:   77 year old male Height:      71 inches Weight:      187 pounds BMI:     26.18 Temp:     98 degrees F oral Pulse rate:   60 / minute Resp:     12 per minute BP sitting:   120 / 82  (left arm)  Vitals Entered By: Willy Eddy, LPN (January 18, 1609 9:41 AM)  CC:  roa.  History of Present Illness: reveiwed the consult note from [facility] after the resolution of his hematoma still has periodic dizzyness  but has not had any "spellls" but he still takes the antivert reveiw of the blood sugars they have moderately high  Hypertension History:      He denies headache, chest pain, palpitations, dyspnea with exertion, orthopnea, PND, peripheral edema, visual symptoms, neurologic problems, syncope, and side effects from treatment.        Positive major cardiovascular risk factors include male age 33 years old or older, diabetes, and hypertension.  Negative major cardiovascular risk factors include non-tobacco-user status.     Current Medications (verified): 1)  Glucotrol Xl 5 Mg  Tb24 (Glipizide) .... Once Daily 2)  Adult Aspirin Ec Low Strength 81 Mg  Tbec (Aspirin) .... Once Daily 3)  Altace 2.5 Mg  Caps (Ramipril) .Marland Kitchen.. 1 Twice Daily 4)  Freestyle Lite   Strp (Glucose Blood) .... Test Twice Daily Before Breakfast and Supper   ( 250.82) 5)  Celebrex 200 Mg  Caps (Celecoxib) .... One By Mouth Daily As Needed 6)  Vicodin 5-500 Mg Tabs (Hydrocodone-Acetaminophen) .Marland Kitchen.. 1 Every 4 -6 Hopurs As Needed Pain 7)  Accu-Chek Aviva  Strp (Glucose Blood) .Marland Kitchen.. 1 Two Times A Day 8)  Ranitidine Hcl 300 Mg Caps (Ranitidine Hcl) .Marland Kitchen.. 1 Once Daily  Allergies (verified): No Known Drug Allergies  Past History:  Family History: Last updated: 01/30/2007 Family History Diabetes 1st degree relative Family History Lung cancer  Social History: Last updated: 01/30/2007 Former Smoker Alcohol use-no Married  Risk Factors: Exercise: no  (07/22/2007)  Risk Factors: Smoking Status: quit (01/30/2007)  Past medical, surgical, family and social histories (including risk factors) reviewed, and no changes noted (except as noted below).  Past Medical History: Reviewed history from 11/05/2007 and no changes required. Hypertension Diabetes mellitus, type II GERD  Past Surgical History: Reviewed history from 01/30/2007 and no changes required. Appendectomy Cholecystectomy Colonoscopy-05/09/2006  Family History: Reviewed history from 01/30/2007 and no changes required. Family History Diabetes 1st degree relative Family History Lung cancer  Social History: Reviewed history from 01/30/2007 and no changes required. Former Smoker Alcohol use-no Married  Review of Systems  The patient denies anorexia, fever, weight loss, weight gain, vision loss, decreased hearing, hoarseness, chest pain, syncope, dyspnea on exertion, peripheral edema, prolonged cough, headaches, hemoptysis, abdominal pain, melena, hematochezia, severe indigestion/heartburn, hematuria, incontinence, genital sores, muscle weakness, suspicious skin lesions, transient blindness, difficulty walking, depression, unusual weight change, abnormal bleeding, enlarged lymph nodes, angioedema, breast masses, and testicular masses.    Physical Exam  General:  contuson to scalp on left parietal area Head:  excoriation.   Eyes:  No corneal or conjunctival inflammation noted. EOMI. Perrla. Funduscopic exam benign, without hemorrhages, exudates or papilledema. Vision grossly normal. Nose:  no external deformity and no nasal discharge.   Mouth:  pharynx pink and moist and no erythema.   Neck:  No deformities, masses, or tenderness noted. Lungs:  normal respiratory effort and no wheezes.   Heart:  normal rate and regular rhythm.   Abdomen:  Bowel sounds positive,abdomen soft and non-tender without masses, organomegaly or hernias noted. Msk:  No deformity or scoliosis noted  of thoracic or lumbar spine.    Diabetes Management Exam:    Foot Exam (with socks and/or shoes not present):       Sensory-Pinprick/Light touch:          Left medial foot (L-4): normal          Left dorsal foot (L-5): normal          Left lateral foot (S-1): normal          Right medial foot (L-4): normal          Right dorsal foot (L-5): normal          Right lateral foot (S-1): normal       Sensory-Monofilament:          Left foot: normal          Right foot: normal       Inspection:          Left foot: normal          Right foot: normal       Nails:          Left foot: normal          Right foot: normal   Impression & Recommendations:  Problem # 1:  UNSPECIFIED LABYRINTHINE DYSFUNCTION (ICD-386.50) Assessment Improved went to rehab with good result witll wean off the antivert and resume the aspirin  Problem # 2:  SUBDURAL HEMOR FLW INJR W/O OPN ICW MOD LOC (ICD-852.23) Assessment: Comment Only resolved and will resume the aspirin  Problem # 3:  DIABETES MELLITUS, TYPE II (ICD-250.00) Assessment: Deteriorated stable with a 1 c's but recent increased blood glucoses suggest the need to continue/increase exercize His updated medication list for this problem includes:    Glucotrol Xl 5 Mg Tb24 (Glipizide) ..... Once daily    Adult Aspirin Ec Low Strength 81 Mg Tbec (Aspirin) ..... Once daily    Altace 2.5 Mg Caps (Ramipril) .Marland Kitchen... 1 twice daily  Labs Reviewed: Creat: 1.3 (10/26/2008)    Reviewed HgBA1c results: 6.7 (10/26/2008)  6.6 (04/21/2008)  Problem # 4:  HYPERTENSION (ICD-401.9)  His updated medication list for this problem includes:    Altace 2.5 Mg Caps (Ramipril) .Marland Kitchen... 1 twice daily  BP today: 120/82 Prior BP: 140/84 (10/26/2008)  Prior 10 Yr Risk Heart Disease: 40 % (08/10/2008)  Labs Reviewed: K+: 4.1 (10/26/2008) Creat: : 1.3 (10/26/2008)   Chol: 172 (07/15/2007)   HDL: 27.6 (07/15/2007)   LDL: 122 (07/15/2007)   TG: 113 (07/15/2007)  Complete  Medication List: 1)  Glucotrol Xl 5 Mg Tb24 (Glipizide) .... Once daily 2)  Adult Aspirin Ec Low Strength 81 Mg Tbec (Aspirin) .... Once daily 3)  Altace 2.5 Mg Caps (Ramipril) .Marland Kitchen.. 1 twice daily 4)  Freestyle Lite Strp (Glucose blood) .... Test twice daily before breakfast and supper   ( 250.82) 5)  Celebrex 200 Mg Caps (Celecoxib) .... One by mouth daily as needed 6)  Accu-chek Aviva Strp (Glucose blood) .Marland Kitchen.. 1 two times a day 7)  Ranitidine Hcl 300 Mg Caps (Ranitidine hcl) .Marland Kitchen.. 1 once daily  Hypertension Assessment/Plan:      The patient's hypertensive risk group is category C: Target organ damage and/or diabetes.  His calculated 10 year risk of coronary heart disease  is 40 %.  Today's blood pressure is 120/82.  His blood pressure goal is < 130/80.  Patient Instructions: 1)  resume the 81 mg aspirin daily 2)  stop the meclizine 3)  take a zyrtec ( over ther counter) at night for about 2 weeks 4)  Please schedule a follow-up appointment in 3 months. 5)  be sure to get eye exam! Prescriptions: RANITIDINE HCL 300 MG CAPS (RANITIDINE HCL) 1 once daily  #90 x 3   Entered by:   Willy Eddy, LPN   Authorized by:   Stacie Glaze MD   Signed by:   Willy Eddy, LPN on 16/04/9603   Method used:   Electronically to        [facility]  [phone]* (retail)       [address] [place], [place]  [address]       Ph: [phone] or [phone]       Fax: [phone]   RxID:   [id number]

## 2010-08-14 NOTE — Assessment & Plan Note (Signed)
Summary: 3 month rov/njr/pt rescd//ccm   Vital Signs:  Patient profile:   77 year old male Height:      71 inches Weight:      184 pounds BMI:     25.76 Temp:     98.2 degrees F oral Pulse rate:   76 / minute Resp:     14 per minute BP sitting:   130 / 80  (left arm)  Vitals Entered By: Willy Eddy, LPN (October 02, 2009 11:46 AM) CC: roa- completed all meds for uri and continues to c/o cough and congestion, Hypertension Management   CC:  roa- completed all meds for uri and continues to c/o cough and congestion and Hypertension Management.  History of Present Illness: improved but still cough and congested  the cough meds we to expesive still has a cough with clear sputum  Blood sugars have been stable and blood pressure is stable  Hypertension History:      He denies headache, chest pain, palpitations, dyspnea with exertion, orthopnea, PND, peripheral edema, visual symptoms, neurologic problems, syncope, and side effects from treatment.        Positive major cardiovascular risk factors include male age 96 years old or older, diabetes, and hypertension.  Negative major cardiovascular risk factors include non-tobacco-user status.     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 Once Daily 2)  Adult Aspirin Ec Low Strength 81 Mg  Tbec (Aspirin) .... Once Daily 3)  Altace 2.5 Mg  Caps (Ramipril) .Marland Kitchen.. 1 Twice Daily 4)  Celebrex 200 Mg  Caps (Celecoxib) .... One By Mouth Daily As Needed 5)  Accu-Chek Aviva  Strp (Glucose Blood) .Marland Kitchen.. 1 Two Times A Day 6)  Ranitidine Hcl 300 Mg Caps (Ranitidine Hcl) .Marland Kitchen.. 1 Once Daily 7)  Accu-Chek Soft Touch Lancets  Misc (Lancets) .Marland Kitchen.. 1 Two Times A Day  Allergies (verified): No Known Drug Allergies  Past History:  Family History: Last updated: 01/30/2007 Family History Diabetes 1st degree relative Family History Lung cancer  Social History: Last updated: 01/30/2007 Former Smoker Alcohol use-no Married  Risk Factors: Exercise: no (07/22/2007)  Risk Factors: Smoking Status: quit (10/02/2009)  Past medical, surgical, family and social histories (including risk factors) reviewed, and no changes noted (except as noted below).  Past Medical History: Reviewed history from 11/05/2007 and no changes required. Hypertension Diabetes mellitus, type II GERD  Past Surgical History: Reviewed history from 01/30/2007 and no changes required. Appendectomy Cholecystectomy Colonoscopy-05/09/2006  Family History: Reviewed history from 01/30/2007 and no changes required. Family History Diabetes 1st degree relative Family History Lung cancer  Social History: Reviewed history from 01/30/2007 and no changes required. Former Smoker Alcohol use-no Married  Review of Systems  The patient denies anorexia, fever, weight loss, weight gain, vision loss, decreased hearing, hoarseness, chest pain, syncope, dyspnea on exertion, peripheral edema, prolonged cough, headaches, hemoptysis, abdominal pain, melena, hematochezia, severe indigestion/heartburn, hematuria,  incontinence, genital sores, muscle weakness, suspicious skin lesions, transient blindness, difficulty walking, depression, unusual weight change, abnormal bleeding, enlarged lymph nodes, angioedema, breast masses, and testicular masses.    Physical Exam  General:  alert and well-developed.   Head:  normocephalic and atraumatic.   Eyes:  pupils equal and pupils round.   Ears:  R ear normal and L ear normal.   Mouth:  pharynx pink and moist and no erythema.   Neck:  No deformities, masses, or tenderness noted. Lungs:  normal respiratory effort and no wheezes.   Heart:  normal rate and regular rhythm.   Abdomen:  soft and non-tender.   Msk:  normal ROM and no joint instability.   Extremities:  No clubbing, cyanosis, edema, or deformity noted with normal full range of motion of all joints.   Neurologic:  alert & oriented X3 and finger-to-nose normal.   Skin:  AK on left arm and on forhead that are suspciaous for transformation Cervical Nodes:  No lymphadenopathy noted Axillary Nodes:  No palpable lymphadenopathy   Impression & Recommendations:  Problem # 1:  PNEUMONIA, RIGHT MIDDLE LOBE (ICD-486)  resolved The following medications were removed from the medication list:    Suprax 400 Mg Tabs (Cefixime) ..... One by mouth daily for 10 days  nstructed patient to complete antibiotics, and call for worsened shortness of breath or new symptoms.   Problem # 2:  DIABETES MELLITUS, TYPE II (ICD-250.00)  His updated medication list for this problem includes:    Glucotrol Xl 5 Mg Tb24 (Glipizide) ..... Once daily    Adult Aspirin Ec Low Strength 81 Mg Tbec (Aspirin) ..... Once daily    Altace 2.5 Mg Caps (Ramipril) .Marland Kitchen... 1 twice daily  Labs Reviewed: Creat: 1.3 (10/26/2008)    Reviewed HgBA1c results: 7.2 (05/03/2009)  6.7 (10/26/2008)  Orders: TLB-A1C / Hgb A1C (Glycohemoglobin) (83036-A1C)  Problem # 3:  HYPERTENSION (ICD-401.9)  His updated medication list for this problem  includes:    Altace 2.5 Mg Caps (Ramipril) .Marland Kitchen... 1 twice daily  Orders: TLB-A1C / Hgb A1C (Glycohemoglobin) (83036-A1C)  BP today: 130/80 Prior BP: 130/80 (09/22/2009)  Prior 10 Yr Risk Heart Disease: 47 % (05/03/2009)  Labs Reviewed: K+: 4.1 (10/26/2008) Creat: : 1.3 (10/26/2008)   Chol: 172 (07/15/2007)   HDL: 27.6 (07/15/2007)   LDL: 122 (07/15/2007)   TG: 113 (07/15/2007)  Complete Medication List: 1)  Glucotrol Xl 5 Mg Tb24 (Glipizide) .... Once daily 2)  Adult Aspirin Ec Low Strength 81 Mg Tbec (Aspirin) .... Once daily 3)  Altace 2.5 Mg Caps (Ramipril) .Marland Kitchen.. 1 twice daily 4)  Celebrex 200 Mg Caps (Celecoxib) .... One by mouth daily as needed 5)  Accu-chek Aviva Strp (Glucose blood) .Marland Kitchen.. 1 two times a day 6)  Ranitidine Hcl 300 Mg Caps (Ranitidine hcl) .Marland Kitchen.. 1 once daily 7)  Accu-chek Soft Touch Lancets Misc (Lancets) .Marland Kitchen.. 1 two times a day 8)  Delsym 30 Mg/14ml Lqcr (Dextromethorphan polistirex) .... Two tsp by mouth two times a day for the next week  Other Orders: Venipuncture ([address]) TLB-BMP (Basic Metabolic Panel-BMET) (80048-METABOL)  Hypertension Assessment/Plan:      The patient's hypertensive risk group is category C: Target organ damage and/or diabetes.  His calculated 10 year risk of coronary heart disease  is 47 %.  Today's blood pressure is 130/80.  His blood pressure goal is < 130/80.  Patient Instructions: 1)  Please schedule a follow-up appointment in 3 months. Prescriptions: ACCU-CHEK SOFT TOUCH LANCETS  MISC (LANCETS) 1 two times a day  #200 x 3   Entered by:   Willy Eddy, LPN   Authorized by:   Stacie Glaze MD   Signed by:   Willy Eddy, LPN on 28/31/5176   Method used:   Electronically to        [facility]* (mail-order)       [address]       [place], [place]  [address]       Ph: [phone]       Fax: [phone]   RxID:   [id number] ACCU-CHEK AVIVA  STRP (GLUCOSE BLOOD) 1 two times a day  #200 x 3    Entered by:   Willy Eddy, LPN   Authorized by:   Stacie Glaze MD   Signed by:   Willy Eddy, LPN on 38/04/1750   Method used:   Electronically to        [facility]* (mail-order)       [address] [place]  [address]       Ph: [phone]       Fax: [phone]   RxID:   [id number]

## 2010-08-14 NOTE — Letter (Signed)
Summary: Eye Exam/[facility]  Eye Exam/[facility]   Imported By: Maryln Gottron 06/04/2010 10:56:53  _____________________________________________________________________  External Attachment:    Type:   Image     Comment:   External Document

## 2010-08-22 NOTE — Assessment & Plan Note (Signed)
Summary: 3 MONTH ROV/NJR   Vital Signs:  Patient profile:   77 year old male Height:      71 inches Weight:      188 pounds BMI:     26.32 Temp:     98.2 degrees F oral Pulse rate:   76 / minute Resp:     14 per minute BP sitting:   126 / 76  (left arm)  Vitals Entered By: Willy Eddy, LPN (July 31, 2010 10:07 AM) CC: roa, Hypertension Management Is Patient Diabetic? Yes Did you bring your meter with you today? No   Primary Care Provider:  Stacie Glaze MD  CC:  roa and Hypertension Management.  History of Present Illness: new meter given and use taught for accucheck avia I have spent greater that 30 min face to face evaluating this patient 1/2 in counciling  The pts DM is in fair control with fasting gluoses in the 120-130 range A1C due foot exam for toe pain in third toe with raised white painfull nail ( fungus)   Hypertension History:      He denies headache, chest pain, palpitations, dyspnea with exertion, orthopnea, PND, peripheral edema, visual symptoms, neurologic problems, syncope, and side effects from treatment.        Positive major cardiovascular risk factors include male age 52 years old or older, diabetes, and hypertension.  Negative major cardiovascular risk factors include non-tobacco-user status.     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 Opn Icw Mod Loc  (ICD-852.23) 8)  Memory Loss  (ICD-780.93) 9)  Need Prophylactic Vaccination&inoculation Flu  (ICD-V04.81) 10)  Shoulder Pain, Left, Chronic   (ICD-719.41) 11)  Cervical Spondylosis Without Myelopathy  (ICD-721.0) 12)  Chest Pain, Atypical  (ICD-786.59) 13)  Gerd  (ICD-530.81) 14)  Physical Examination  (ICD-V70.0) 15)  Diab W/oth Manifests Type Ii/uns Type Uncntrl  (ICD-250.82) 16)  Family History Diabetes 1st Degree Relative  (ICD-V18.0) 17)  Hypertension  (ICD-401.9)  Current Problems (verified): 1)  Leg Cramps, Nocturnal  (ICD-729.82) 2)  Pneumonia, Right Middle Lobe  (ICD-486) 3)  Actinic Keratosis, Forehead, Left  (ICD-702.0) 4)  Unspecified Disorder of Shoulder Joint  (ICD-719.91) 5)  Unspecified Labyrinthine Dysfunction  (ICD-386.50) 6)  Subdural Hemor Flw Injr w/o Opn Icw Mod Loc  (ICD-852.23) 7)  Memory Loss  (ICD-780.93) 8)  Need Prophylactic Vaccination&inoculation Flu  (ICD-V04.81) 9)  Shoulder Pain, Left, Chronic  (ICD-719.41) 10)  Cervical Spondylosis Without Myelopathy  (ICD-721.0) 11)  Chest Pain, Atypical  (ICD-786.59) 12)  Gerd  (ICD-530.81) 13)  Physical Examination  (ICD-V70.0) 14)  Diabetes Mellitus, Type II  (ICD-250.00) 15)  Family History Diabetes 1st Degree Relative  (ICD-V18.0) 16)  Hypertension  (ICD-401.9)  Medications Prior to Update: 1)  Glucotrol Xl 5 Mg  Tb24 (Glipizide) .... Once Daily 2)  Adult Aspirin Ec Low Strength 81 Mg  Tbec (Aspirin) .... Once Daily 3)  Altace 2.5 Mg  Caps (Ramipril) .Marland Kitchen.. 1 Twice Daily 4)  Celebrex 200 Mg  Caps (Celecoxib) .... One By Mouth Daily As Needed 5)  Accu-Chek  Aviva  Strp (Glucose Blood) .Marland Kitchen.. 1 Two Times A Day 6)  Ranitidine Hcl 300 Mg Caps (Ranitidine Hcl) .Marland Kitchen.. 1 Once Daily 7)  Accu-Chek Soft Touch Lancets  Misc (Lancets) .Marland Kitchen.. 1 Two Times A Day 8)  Amitriptyline Hcl 10 Mg Tabs (Amitriptyline Hcl) .... One By Mouth Q Hs  Current Medications (verified): 1)  Glucotrol Xl 5 Mg  Tb24 (Glipizide) .... Once Daily 2)  Adult Aspirin Ec Low Strength 81 Mg  Tbec (Aspirin) .... Once Daily 3)  Altace 2.5 Mg  Caps (Ramipril) .Marland Kitchen.. 1 Twice Daily 4)  Celebrex 200  Mg  Caps (Celecoxib) .... One By Mouth Daily As Needed 5)  Accu-Chek Aviva  Strp (Glucose Blood) .Marland Kitchen.. 1 Two Times A Day 6)  Ranitidine Hcl 300 Mg Caps (Ranitidine Hcl) .Marland Kitchen.. 1 Once Daily 7)  Accu-Chek Soft Touch Lancets  Misc (Lancets) .Marland Kitchen.. 1 Two Times A Day 8)  Amitriptyline Hcl 10 Mg Tabs (Amitriptyline Hcl) .... One By Mouth Q Hs  Allergies (verified): No Known Drug Allergies  Past History:  Family History: Last updated: 01/30/2007 Family History Diabetes 1st degree relative Family History Lung cancer  Social History: Last updated: 01/30/2007 Former Smoker Alcohol use-no Married  Risk Factors: Exercise: no (07/22/2007)  Risk Factors: Smoking Status: quit (07/31/2010)  Past medical, surgical, family and social histories (including risk factors) reviewed, and no changes noted (except as noted below).  Past Medical History: Reviewed history from 11/05/2007 and no changes required. Hypertension Diabetes mellitus, type II GERD  Past Surgical History: Reviewed history from 01/30/2007 and no changes required. Appendectomy Cholecystectomy Colonoscopy-05/09/2006  Family History: Reviewed history from 01/30/2007 and no changes required. Family History Diabetes 1st degree relative Family History Lung cancer  Social History: Reviewed history from 01/30/2007 and no changes required. Former Smoker Alcohol use-no Married  Review of Systems  The patient denies anorexia, fever, weight loss, weight gain, vision loss, decreased hearing, hoarseness, chest pain, syncope, dyspnea on exertion, peripheral edema, prolonged cough, headaches, hemoptysis, abdominal pain, melena, hematochezia, severe indigestion/heartburn, hematuria, incontinence, genital sores, muscle weakness, suspicious skin lesions, transient blindness, difficulty walking, depression, unusual weight change, abnormal bleeding, enlarged lymph nodes, angioedema, and breast masses.    Physical Exam  General:  alert  and well-developed.   Head:  normocephalic and atraumatic.   Eyes:  pupils equal and pupils round.   Ears:  R ear normal and L ear normal.   Nose:  no external deformity and no nasal discharge.   Mouth:  pharynx pink and moist and no erythema.   Neck:  No deformities, masses, or tenderness noted. Lungs:  normal respiratory effort and no wheezes.   Heart:  normal rate and regular rhythm.   Abdomen:  soft and non-tender.   Msk:  normal ROM and no joint instability.    Diabetes Management Exam:       Nails:          Left foot: thickened          Right foot: fungal infection   Impression & Recommendations:  Problem # 1:  DIAB W/OTH MANIFESTS TYPE II/UNS TYPE UNCNTRL (ICD-250.82) Assessment Deteriorated  fair control, reviewed medications and diet with goal a1c of 6.0 His updated medication list for this problem includes:    Glucotrol Xl 5 Mg Tb24 (Glipizide) ..... Once daily    Adult Aspirin Ec Low Strength 81 Mg Tbec (Aspirin) ..... Once daily    Altace 2.5 Mg Caps (Ramipril) .Marland Kitchen... 1 twice daily  Labs Reviewed: Creat: 1.2 (  12/25/2009)     Last Eye Exam: normal (05/08/2010) Reviewed HgBA1c results: 6.7 (12/25/2009)  6.7 (10/02/2009)  Orders: Venipuncture ([address]) Specimen Handling (60454) TLB-A1C / Hgb A1C (Glycohemoglobin) (83036-A1C) TLB-Microalbumin/Creat Ratio, Urine (82043-MALB)  Problem # 2:  ONYCHOMYCOSIS (ICD-110.1) Assessment: New  trial of topical therapy with vicks vaporub  Discussed nail care and medication treatment options.   Problem # 3:  HYPERTENSION (ICD-401.9) Assessment: Improved  Td Booster: Historical (07/15/2006)   Flu Vax: Fluvax 3+ (04/30/2010)   Chol: 172 (07/15/2007)   HDL: 27.6 (07/15/2007)   LDL: 122 (07/15/2007)   TG: 113 (07/15/2007) TSH: 1.54 (07/15/2007)   HgbA1C: 6.7 (12/25/2009)   PSA: 2.18 (07/15/2007)  Discussed using sunscreen, use of alcohol, drug use, self testicular exam, routine dental care, routine eye care, routine physical  exam, seat belts, multiple vitamins, osteoporosis prevention, adequate calcium intake in diet, and recommendations for immunizations.  Discussed exercise and checking cholesterol.  Discussed gun safety, safe sex, and contraception. Also recommend checking PSA.  His updated medication list for this problem includes:    Altace 2.5 Mg Caps (Ramipril) .Marland Kitchen... 1 twice daily  BP today: 126/76 Prior BP: 130/70 (04/30/2010)  10 Yr Risk Heart Disease: 40 % Prior 10 Yr Risk Heart Disease: 47 % (05/03/2009)  Labs Reviewed: K+: 4.8 (12/25/2009) Creat: : 1.2 (12/25/2009)   Chol: 172 (07/15/2007)   HDL: 27.6 (07/15/2007)   LDL: 122 (07/15/2007)   TG: 113 (07/15/2007)  Complete Medication List: 1)  Glucotrol Xl 5 Mg Tb24 (Glipizide) .... Once daily 2)  Adult Aspirin Ec Low Strength 81 Mg Tbec (Aspirin) .... Once daily 3)  Altace 2.5 Mg Caps (Ramipril) .Marland Kitchen.. 1 twice daily 4)  Celebrex 200 Mg Caps (Celecoxib) .... One by mouth daily as needed 5)  Accu-chek Aviva Strp (Glucose blood) .Marland Kitchen.. 1 two times a day 6)  Ranitidine Hcl 300 Mg Caps (Ranitidine hcl) .Marland Kitchen.. 1 once daily 7)  Accu-chek Soft Touch Lancets Misc (Lancets) .Marland Kitchen.. 1 two times a day 8)  Amitriptyline Hcl 10 Mg Tabs (Amitriptyline hcl) .... One by mouth q hs  Other Orders: TLB-BMP (Basic Metabolic Panel-BMET) (80048-METABOL)  Hypertension Assessment/Plan:      The patient's hypertensive risk group is category C: Target organ damage and/or diabetes.  His calculated 10 year risk of coronary heart disease is 40 %.  Today's blood pressure is 126/76.  His blood pressure goal is < 130/80.  Patient Instructions: 1)  Please schedule a follow-up appointment in 3 months. 2)  apply a coating of vick and cover with a bandaid twice a day for 14 days... if responsive continue for  one month Prescriptions: ACCU-CHEK AVIVA  STRP (GLUCOSE BLOOD) 1 two times a day  #200 x 3   Entered by:   Willy Eddy, LPN   Authorized by:   Stacie Glaze MD   Signed  by:   Willy Eddy, LPN on 09/81/1914   Method used:   Electronically to        [facility]  [phone]* (retail)       [address] [place], [place]  [address]       Ph: [phone] or [phone]       Fax: [phone]   RxID:   [id number]    Orders Added: 1)  Venipuncture [56433] 2)  Specimen Handling [99000] 3)  TLB-A1C / Hgb A1C (Glycohemoglobin) [83036-A1C] 4)  TLB-Microalbumin/Creat Ratio, Urine [82043-MALB] 5)  TLB-BMP (Basic Metabolic Panel-BMET) [80048-METABOL] 6)  Est. Patient Level IV [id number]

## 2010-09-14 ENCOUNTER — Other Ambulatory Visit: Payer: Self-pay | Admitting: Internal Medicine

## 2010-10-24 ENCOUNTER — Encounter: Payer: Self-pay | Admitting: Internal Medicine

## 2010-10-29 LAB — GLUCOSE, CAPILLARY
Glucose-Capillary: 112 mg/dL — ABNORMAL HIGH (ref 70–99)
Glucose-Capillary: 129 mg/dL — ABNORMAL HIGH (ref 70–99)
Glucose-Capillary: 140 mg/dL — ABNORMAL HIGH (ref 70–99)
Glucose-Capillary: 141 mg/dL — ABNORMAL HIGH (ref 70–99)
Glucose-Capillary: 150 mg/dL — ABNORMAL HIGH (ref 70–99)

## 2010-10-29 LAB — CBC
HCT: 38.9 % — ABNORMAL LOW (ref 39.0–52.0)
Hemoglobin: 13.3 g/dL (ref 13.0–17.0)
MCHC: 34.3 g/dL (ref 30.0–36.0)
Platelets: 165 10*3/uL (ref 150–400)
RDW: 12.9 % (ref 11.5–15.5)

## 2010-10-29 LAB — BASIC METABOLIC PANEL
BUN: 10 mg/dL (ref 6–23)
CO2: 25 mEq/L (ref 19–32)
Calcium: 8.4 mg/dL (ref 8.4–10.5)
GFR calc non Af Amer: 57 mL/min — ABNORMAL LOW (ref >60)
Glucose, Bld: 123 mg/dL — ABNORMAL HIGH (ref 70–99)
Sodium: 138 mEq/L (ref 135–145)

## 2010-10-29 LAB — CARDIAC PANEL(CRET KIN+CKTOT+MB+TROPI)
CK, MB: 2.6 ng/mL (ref 0.3–4.0)
Total CK: 472 U/L — ABNORMAL HIGH (ref 7–232)
Total CK: 559 U/L — ABNORMAL HIGH (ref 7–232)
Troponin I: 0.01 ng/mL (ref 0.00–0.06)

## 2010-10-31 ENCOUNTER — Encounter: Payer: Self-pay | Admitting: Internal Medicine

## 2010-10-31 ENCOUNTER — Ambulatory Visit (INDEPENDENT_AMBULATORY_CARE_PROVIDER_SITE_OTHER): Payer: PRIVATE HEALTH INSURANCE | Admitting: Internal Medicine

## 2010-10-31 VITALS — BP 130/80 | HR 68 | Temp 98.2°F | Resp 14 | Ht 70.0 in | Wt 188.0 lb

## 2010-10-31 DIAGNOSIS — E119 Type 2 diabetes mellitus without complications: Secondary | ICD-10-CM

## 2010-10-31 DIAGNOSIS — I10 Essential (primary) hypertension: Secondary | ICD-10-CM

## 2010-10-31 DIAGNOSIS — R413 Other amnesia: Secondary | ICD-10-CM

## 2010-10-31 DIAGNOSIS — E1165 Type 2 diabetes mellitus with hyperglycemia: Secondary | ICD-10-CM

## 2010-10-31 NOTE — Progress Notes (Signed)
  Subjective:    Patient ID: Calvin Burnett, male    DOB: 07-21-1933, 77 y.o.   MRN: [id number]  HPI Patient is a pleasant 77 year old male who presents for followup of his diabetes his hypertension. He states his blood sugars have been stable he has lost approximately 10 pounds since his last office visit which is excellent his blood pressure is well-controlled and he tolerates the low dose of Altace He has recovered completely from his right middle lobe pneumonia   Review of Systems  Constitutional: Negative for fever and fatigue.  HENT: Negative for hearing loss, congestion, neck pain and postnasal drip.   Eyes: Negative for discharge, redness and visual disturbance.  Respiratory: Negative for cough, shortness of breath and wheezing.   Cardiovascular: Negative for leg swelling.  Gastrointestinal: Negative for abdominal pain, constipation and abdominal distention.  Genitourinary: Negative for urgency and frequency.  Musculoskeletal: Negative for joint swelling and arthralgias.  Skin: Negative for color change and rash.  Neurological: Negative for weakness and light-headedness.  Hematological: Negative for adenopathy.  Psychiatric/Behavioral: Negative for behavioral problems.   Past Medical History  Diagnosis Date  . Hypertension   . GERD (gastroesophageal reflux disease)   . Diabetes mellitus    Past Surgical History  Procedure Date  . Appendectomy   . Cholecystectomy     reports that he quit smoking about 20 years ago. He does not have any smokeless tobacco history on file. He reports that he does not drink alcohol or use illicit drugs. family history includes COPD in his mother; Cancer in his father; and Lung cancer in an unspecified family member. No Known Allergies      Objective:   Physical Exam  Constitutional: He appears well-developed and well-nourished.  HENT:  Head: Normocephalic and atraumatic.  Eyes: Conjunctivae are normal. Pupils are equal, round, and  reactive to light.  Neck: Normal range of motion. Neck supple.  Cardiovascular: Normal rate and regular rhythm.   Pulmonary/Chest: Effort normal and breath sounds normal.  Abdominal: Soft. Bowel sounds are normal.          Assessment & Plan:

## 2010-10-31 NOTE — Assessment & Plan Note (Signed)
Good blood pressure control current medication we'll check a basic metabolic panel

## 2010-10-31 NOTE — Assessment & Plan Note (Signed)
Stable no progression 

## 2010-10-31 NOTE — Assessment & Plan Note (Signed)
The patient presents for followup of his blood glucoses he states they have been in the normal range his last hemoglobin A1c was 7.5 in January but we will check one again today because he has lost weight I believe he would be in better control

## 2010-11-01 LAB — BASIC METABOLIC PANEL
CO2: 28 mEq/L (ref 19–32)
Calcium: 9.5 mg/dL (ref 8.4–10.5)
GFR: 62.15 mL/min (ref >60.00)
Glucose, Bld: 127 mg/dL — ABNORMAL HIGH (ref 70–99)
Potassium: 4.7 mEq/L (ref 3.5–5.1)
Sodium: 141 mEq/L (ref 135–145)

## 2010-11-12 ENCOUNTER — Other Ambulatory Visit: Payer: Self-pay | Admitting: Internal Medicine
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
## 2011-01-30 ENCOUNTER — Ambulatory Visit: Payer: PRIVATE HEALTH INSURANCE | Admitting: Internal Medicine

## 2011-01-31 ENCOUNTER — Other Ambulatory Visit: Payer: Self-pay | Admitting: Internal Medicine

## 2011-02-01 ENCOUNTER — Ambulatory Visit (INDEPENDENT_AMBULATORY_CARE_PROVIDER_SITE_OTHER): Payer: PRIVATE HEALTH INSURANCE | Admitting: Internal Medicine

## 2011-02-01 ENCOUNTER — Encounter: Payer: Self-pay | Admitting: Internal Medicine

## 2011-02-01 VITALS — BP 145/80 | HR 72 | Temp 98.2°F | Resp 14 | Ht 70.0 in | Wt 187.0 lb

## 2011-02-01 DIAGNOSIS — E1169 Type 2 diabetes mellitus with other specified complication: Secondary | ICD-10-CM

## 2011-02-01 DIAGNOSIS — E1165 Type 2 diabetes mellitus with hyperglycemia: Secondary | ICD-10-CM

## 2011-02-01 DIAGNOSIS — I10 Essential (primary) hypertension: Secondary | ICD-10-CM

## 2011-02-01 MED ORDER — RAMIPRIL 5 MG PO CAPS: 5.0000 mg | ORAL_CAPSULE | Freq: Every day | ORAL | Status: DC

## 2011-02-01 NOTE — Progress Notes (Signed)
  Subjective:    Patient ID: Calvin Burnett, male    DOB: 1934-06-18, 77 y.o.   MRN: [id number]  HPI patient is a 77 year old white male who is followed for diabetes and hypertension.  He was seen his dentist office and had elevated blood pressure we instructed him to double his Altace from 2.5 daily to 2.5 twice a day he did not fully understand his instructions and has only been taking his medication daily he presents today with elevated blood pressure with it without other symptoms he has diabetes and is on an oral agent sulfonylurea. He is not fully compliant with a diabetic diet and that he eats sweets and probably has too much carbohydrates however his weight has been stable and he does    Review of Systems  Constitutional: Negative for fever and fatigue.  HENT: Negative for hearing loss, congestion, neck pain and postnasal drip.   Eyes: Negative for discharge, redness and visual disturbance.  Respiratory: Negative for cough, shortness of breath and wheezing.   Cardiovascular: Negative for leg swelling.  Gastrointestinal: Negative for abdominal pain, constipation and abdominal distention.  Genitourinary: Negative for urgency and frequency.  Musculoskeletal: Negative for joint swelling and arthralgias.  Skin: Negative for color change and rash.  Neurological: Negative for weakness and light-headedness.  Hematological: Negative for adenopathy.  Psychiatric/Behavioral: Negative for behavioral problems.   Past Medical History  Diagnosis Date  . Hypertension   . GERD (gastroesophageal reflux disease)   . Diabetes mellitus    Past Surgical History  Procedure Date  . Appendectomy   . Cholecystectomy     reports that he quit smoking about 20 years ago. He does not have any smokeless tobacco history on file. He reports that he does not drink alcohol or use illicit drugs. family history includes COPD in his mother; Cancer in his father; and Lung cancer in an unspecified family  member. No Known Allergies     Objective:   Physical Exam  Constitutional: He appears well-developed and well-nourished.  HENT:  Head: Normocephalic and atraumatic.  Eyes: Conjunctivae are normal. Pupils are equal, round, and reactive to light.  Neck: Normal range of motion. Neck supple.  Cardiovascular: Normal rate and regular rhythm.   Pulmonary/Chest: Effort normal and breath sounds normal.  Abdominal: Soft. Bowel sounds are normal.          Assessment & Plan:  Patient's diabetes is in moderately well controlled with an A1c in the mid sevens are goal however this children would be to get his A1c below 7 ....   If he can accomplish the diet changes needed.  Patient has history of mild renal insufficiency hypertension poorly controlled to 2 not doubling the dose is all phases he was instructed.  Disabled by compliance we will call in a prescription for Ultrase 5 mg by mouth daily

## 2011-02-01 NOTE — Patient Instructions (Signed)
You need to double your efforts to limit simple sugars and white carbohydrates

## 2011-03-11 ENCOUNTER — Other Ambulatory Visit: Payer: Self-pay | Admitting: Internal Medicine

## 2011-04-04 ENCOUNTER — Ambulatory Visit: Payer: PRIVATE HEALTH INSURANCE | Admitting: Internal Medicine

## 2011-04-04 ENCOUNTER — Ambulatory Visit (INDEPENDENT_AMBULATORY_CARE_PROVIDER_SITE_OTHER): Payer: PRIVATE HEALTH INSURANCE | Admitting: Internal Medicine

## 2011-04-04 DIAGNOSIS — Z23 Encounter for immunization: Secondary | ICD-10-CM

## 2011-04-15 ENCOUNTER — Other Ambulatory Visit: Payer: Self-pay | Admitting: Internal Medicine

## 2011-04-30 ENCOUNTER — Other Ambulatory Visit: Payer: Self-pay | Admitting: Internal Medicine

## 2011-05-24 ENCOUNTER — Encounter: Payer: Self-pay | Admitting: Gastroenterology

## 2011-05-28 ENCOUNTER — Encounter: Payer: Self-pay | Admitting: Internal Medicine

## 2011-05-28 ENCOUNTER — Ambulatory Visit (INDEPENDENT_AMBULATORY_CARE_PROVIDER_SITE_OTHER): Payer: PRIVATE HEALTH INSURANCE | Admitting: Internal Medicine

## 2011-05-28 DIAGNOSIS — E785 Hyperlipidemia, unspecified: Secondary | ICD-10-CM

## 2011-05-28 DIAGNOSIS — I10 Essential (primary) hypertension: Secondary | ICD-10-CM

## 2011-05-28 DIAGNOSIS — L570 Actinic keratosis: Secondary | ICD-10-CM

## 2011-05-28 DIAGNOSIS — E1165 Type 2 diabetes mellitus with hyperglycemia: Secondary | ICD-10-CM

## 2011-05-28 LAB — LIPID PANEL
HDL: 38.6 mg/dL — ABNORMAL LOW (ref >39.00)
Triglycerides: 166 mg/dL — ABNORMAL HIGH (ref 0.0–149.0)
VLDL: 33.2 mg/dL (ref 0.0–40.0)

## 2011-05-28 LAB — BASIC METABOLIC PANEL
BUN: 11 mg/dL (ref 6–23)
Chloride: 105 mEq/L (ref 96–112)
GFR: 62.05 mL/min (ref >60.00)
Potassium: 4.3 mEq/L (ref 3.5–5.1)
Sodium: 142 mEq/L (ref 135–145)

## 2011-05-28 LAB — HEMOGLOBIN A1C: Hgb A1c MFr Bld: 7.3 % — ABNORMAL HIGH (ref 4.6–6.5)

## 2011-05-28 NOTE — Patient Instructions (Signed)
The patient is instructed to continue all medications as prescribed. Schedule followup with check out clerk upon leaving the clinic  

## 2011-05-29 NOTE — Progress Notes (Signed)
  Subjective:    Patient ID: Calvin Burnett, male    DOB: January 16, 1934, 77 y.o.   MRN: [id number]  HPI  Patient Calvin Burnett 77-year-old white male who presents for followup of hypertension diabetes and gastroesophageal reflux.  He's been placed on Zantac 2 mg at bedtime and states that his GERD symptoms have improved dramatically.  His wife is present with him and she concurs that he has not complained of any reflux.  His hypertension is stable his current medications he states his blood sugars have ranged between 100 140 and monitoring.  His weight has been stable he denies any chest pain shortness of breath PND orthopnea  Review of Systems  Constitutional: Negative for fever and fatigue.  HENT: Negative for hearing loss, congestion, neck pain and postnasal drip.   Eyes: Negative for discharge, redness and visual disturbance.  Respiratory: Negative for cough, shortness of breath and wheezing.   Cardiovascular: Negative for leg swelling.  Gastrointestinal: Negative for abdominal pain, constipation and abdominal distention.  Genitourinary: Negative for urgency and frequency.  Musculoskeletal: Negative for joint swelling and arthralgias.  Skin: Negative for color change and rash.  Neurological: Negative for weakness and light-headedness.  Hematological: Negative for adenopathy.  Psychiatric/Behavioral: Negative for behavioral problems.   Past Medical History  Diagnosis Date  . Hypertension   . GERD (gastroesophageal reflux disease)   . Diabetes mellitus    Past Surgical History  Procedure Date  . Appendectomy   . Cholecystectomy     reports that he quit smoking about 20 years ago. He does not have any smokeless tobacco history on file. He reports that he does not drink alcohol or use illicit drugs. family history includes COPD in his mother; Cancer in his father; and Lung cancer in an unspecified family member. No Known Allergies     Objective:   Physical Exam  Nursing note and  vitals reviewed. Constitutional: He appears well-developed and well-nourished.  HENT:  Head: Normocephalic and atraumatic.  Eyes: Conjunctivae are normal. Pupils are equal, round, and reactive to light.  Neck: Normal range of motion. Neck supple.  Cardiovascular: Normal rate and regular rhythm.   Pulmonary/Chest: Effort normal and breath sounds normal.  Abdominal: Soft. Bowel sounds are normal.    Examination of skin revealed an ulcerated actinic keratosis on his left temple and behind his left ear      Assessment & Plan:   Pressure stable on current medications he denies any chest pain shortness of breath PND orthopnea GERD is stable on Zantac 300 He has actinic keratosis on his left temple and one behind his left ear that have ulcerated and should be treated with cryotherapy.  Patient gave informed consent to 60 seconds of liquid nitrogen cryotherapy was applied to each lesion patient tolerated the procedure well post care instructions were given including instructions of the lesions recur to contact her office for referral to dermatology Hemoglobin A1c will be obtained to monitor his diabetes

## 2011-07-30 ENCOUNTER — Encounter: Payer: Self-pay | Admitting: Internal Medicine

## 2011-07-30 ENCOUNTER — Ambulatory Visit (INDEPENDENT_AMBULATORY_CARE_PROVIDER_SITE_OTHER): Payer: 59 | Admitting: Internal Medicine

## 2011-07-30 VITALS — BP 140/80 | HR 72 | Temp 98.2°F | Resp 16 | Ht 70.0 in | Wt 188.0 lb

## 2011-07-30 DIAGNOSIS — I10 Essential (primary) hypertension: Secondary | ICD-10-CM

## 2011-07-30 NOTE — Patient Instructions (Signed)
The patient is instructed to continue all medications as prescribed. Schedule followup with check out clerk upon leaving the clinic  

## 2011-07-30 NOTE — Progress Notes (Signed)
  Subjective:    Patient ID: Calvin Burnett, male    DOB: 27-Nov-1933, 78 y.o.   MRN: [id number]  HPIhas been monitoring CBG and they are in the 100-120 range and has noted some hypoglycemia    Review of Systems  Constitutional: Negative for fever and fatigue.  HENT: Negative for hearing loss, congestion, neck pain and postnasal drip.   Eyes: Negative for discharge, redness and visual disturbance.  Respiratory: Negative for cough, shortness of breath and wheezing.   Cardiovascular: Negative for leg swelling.  Gastrointestinal: Negative for abdominal pain, constipation and abdominal distention.  Genitourinary: Negative for urgency and frequency.  Musculoskeletal: Negative for joint swelling and arthralgias.  Skin: Negative for color change and rash.  Neurological: Negative for weakness and light-headedness.  Hematological: Negative for adenopathy.  Psychiatric/Behavioral: Negative for behavioral problems.       Objective:   Physical Exam  Nursing note and vitals reviewed. Constitutional: He appears well-developed and well-nourished.  HENT:  Head: Normocephalic and atraumatic.  Eyes: Conjunctivae are normal. Pupils are equal, round, and reactive to light.  Neck: Normal range of motion. Neck supple.  Cardiovascular: Normal rate and regular rhythm.   Pulmonary/Chest: Effort normal and breath sounds normal.  Abdominal: Soft. Bowel sounds are normal.          Assessment & Plan:  General he is very well with his diabetes.  His hypertension is stable with his GERD is stable his medications we will check a hemoglobin A1c and follow him in 3 months

## 2011-08-12 ENCOUNTER — Other Ambulatory Visit: Payer: Self-pay | Admitting: Internal Medicine

## 2011-09-11 ENCOUNTER — Other Ambulatory Visit: Payer: Self-pay | Admitting: *Deleted

## 2011-09-11 MED ORDER — GLIPIZIDE ER 5 MG PO TB24: 5.0000 mg | ORAL_TABLET | Freq: Every day | ORAL | Status: DC

## 2011-10-10 ENCOUNTER — Other Ambulatory Visit: Payer: Self-pay | Admitting: Internal Medicine

## 2011-10-28 ENCOUNTER — Other Ambulatory Visit: Payer: Self-pay | Admitting: Internal Medicine

## 2011-10-30 ENCOUNTER — Ambulatory Visit (INDEPENDENT_AMBULATORY_CARE_PROVIDER_SITE_OTHER): Payer: 59 | Admitting: Internal Medicine

## 2011-10-30 ENCOUNTER — Encounter: Payer: Self-pay | Admitting: Internal Medicine

## 2011-10-30 VITALS — BP 130/76 | HR 68 | Temp 98.3°F | Resp 16 | Ht 70.0 in | Wt 185.0 lb

## 2011-10-30 DIAGNOSIS — N139 Obstructive and reflux uropathy, unspecified: Secondary | ICD-10-CM

## 2011-10-30 DIAGNOSIS — T887XXA Unspecified adverse effect of drug or medicament, initial encounter: Secondary | ICD-10-CM

## 2011-10-30 DIAGNOSIS — N401 Enlarged prostate with lower urinary tract symptoms: Secondary | ICD-10-CM

## 2011-10-30 DIAGNOSIS — I10 Essential (primary) hypertension: Secondary | ICD-10-CM

## 2011-10-30 DIAGNOSIS — E785 Hyperlipidemia, unspecified: Secondary | ICD-10-CM

## 2011-10-30 LAB — HEPATIC FUNCTION PANEL
Albumin: 3.9 g/dL (ref 3.5–5.2)
Alkaline Phosphatase: 74 U/L (ref 39–117)
Total Protein: 7 g/dL (ref 6.0–8.3)

## 2011-10-30 LAB — BASIC METABOLIC PANEL
BUN: 14 mg/dL (ref 6–23)
CO2: 27 mEq/L (ref 19–32)
Calcium: 9.4 mg/dL (ref 8.4–10.5)
Creatinine, Ser: 1.3 mg/dL (ref 0.4–1.5)
GFR: 56.56 mL/min — ABNORMAL LOW (ref >60.00)
Glucose, Bld: 123 mg/dL — ABNORMAL HIGH (ref 70–99)
Sodium: 141 mEq/L (ref 135–145)

## 2011-10-30 LAB — LIPID PANEL
Cholesterol: 167 mg/dL (ref 0–200)
HDL: 35.5 mg/dL — ABNORMAL LOW (ref >39.00)
Triglycerides: 93 mg/dL (ref 0.0–149.0)
VLDL: 18.6 mg/dL (ref 0.0–40.0)

## 2011-10-30 LAB — PSA: PSA: 5.12 ng/mL — ABNORMAL HIGH (ref 0.10–4.00)

## 2011-10-30 MED ORDER — GLUCOSE BLOOD VI STRP: ORAL_STRIP | Status: DC

## 2011-10-30 NOTE — Patient Instructions (Addendum)
The patient is instructed to continue all medications as prescribed. Schedule followup with check out clerk upon leaving the clinic  

## 2011-10-30 NOTE — Progress Notes (Signed)
  Subjective:    Patient ID: Calvin Burnett, male    DOB: 02-01-1934, 78 y.o.   MRN: [id number]  HPI is a 78 year old male who is followed for hypertension hyperlipidemia a history of osteoarthritis and hypertension.  His hypertension is well controlled his blood pressure today is 130/76 he is compliant with his medication.  He believes that his CBGs have all been stable and we will measure a hemoglobin A1c today to confirm that.  His weight is stable he remains active.  Shoulder has been stable he has a history of arthritis in his left shoulder He has mild neck arthritis as well As a history of actinic keratosis on his for head and we will examine his forehead today as part of his examination      Review of Systems     Objective:   Physical Exam        Assessment & Plan:

## 2012-02-24 ENCOUNTER — Telehealth: Payer: Self-pay | Admitting: Internal Medicine

## 2012-02-24 NOTE — Telephone Encounter (Signed)
Pt informed per dr Lovell Sheehan- ok to wait until ov- but if large amount of blood go to er

## 2012-02-24 NOTE — Telephone Encounter (Signed)
Pt had blood in his urine Saturday. Need to know if pt needs to get a ua done? Pt has appt with Dr Lovell Sheehan on 03/02/12. Pls call.

## 2012-03-02 ENCOUNTER — Ambulatory Visit (INDEPENDENT_AMBULATORY_CARE_PROVIDER_SITE_OTHER): Payer: 59 | Admitting: Internal Medicine

## 2012-03-02 ENCOUNTER — Encounter: Payer: Self-pay | Admitting: Internal Medicine

## 2012-03-02 VITALS — BP 130/80 | HR 72 | Temp 98.6°F | Resp 16 | Ht 70.0 in | Wt 188.0 lb

## 2012-03-02 DIAGNOSIS — N419 Inflammatory disease of prostate, unspecified: Secondary | ICD-10-CM

## 2012-03-02 DIAGNOSIS — F341 Dysthymic disorder: Secondary | ICD-10-CM

## 2012-03-02 DIAGNOSIS — F329 Major depressive disorder, single episode, unspecified: Secondary | ICD-10-CM

## 2012-03-02 DIAGNOSIS — R310 Gross hematuria: Secondary | ICD-10-CM

## 2012-03-02 MED ORDER — CIPROFLOXACIN HCL 500 MG PO TABS: 500.0000 mg | ORAL_TABLET | Freq: Two times a day (BID) | ORAL | Status: AC

## 2012-03-02 MED ORDER — SERTRALINE HCL 50 MG PO TABS: 50.0000 mg | ORAL_TABLET | Freq: Every day | ORAL | Status: DC

## 2012-03-02 NOTE — Patient Instructions (Signed)
The patient is instructed to continue all medications as prescribed. Schedule followup with check out clerk upon leaving the clinic Prostatitis Prostatitis is an inflammation (the body's way of reacting to injury and/or infection) of the prostate gland. The prostate gland is a male organ. The gland is about the size and shape of a walnut. The prostate is located just below the bladder. It produces semen, which is a fluid that helps nourish and transport sperm. Prostatitis is the most common urinary tract problem in men younger than age 27. There are 4 categories of prostatitis:  I - Acute bacterial prostatitis.   II - Chronic bacterial prostatitis.   III - Chronic prostatitis and chronic pelvic pain syndrome (CPPS).   Inflammatory.   Non inflammatory.   IV - Asymptomatic inflammatory prostatitis.  Acute and chronic bacterial prostatitis are problems with bacterial infections of the prostate. "Acute" infection is usually a one-time problem. "Chronic" bacterial prostatitis is a condition with recurrent infection. It is usually caused by the same germ(bacteria). CPPS has symptoms similar to prostate infection. However, no infection is actually found. This condition can cause problems of ongoing pain. Currently, it cannot be cured. Treatments are available and aimed at symptom control.  Asymptomatic inflammatory prostatitis has no symptoms. It is a condition where infection-fighting cells are found by chance in the urine. The diagnosis is made most often during an exam for other conditions. Other conditions could be infertility or a high level of PSA (prostate-specific antigen) in the blood. SYMPTOMS  Symptoms can vary depending upon the type of prostatitis that exists. There can also be overlap in symptoms. This can make diagnosis difficult. Symptoms: For Acute bacterial prostatitis  Painful urination.   Fever or chills.   Muscle or joint pains.   Low back pain.   Low abdominal pain.    Inability to empty bladder completely.   Sudden urges to urinate.   Frequent urination during the day.   Difficulty starting urine stream.   Need to urinate several times at night (nocturia).   Weak urine stream.   Urethral (tube that carries urine from the bladder out of the body) discharge and dribbling after urination.  For Chronic bacterial prostatitis  Rectal pain.   Pain in the testicles, penis, or tip of the penis.   Pain in the space between the anus and scrotum (perineum).   Low back pain.   Low abdominal pain.   Problems with sexual function.   Painful ejaculation.   Bloody semen.   Inability to empty bladder completely.   Painful urination.   Sudden urges to urinate.   Frequent urination during the day.   Difficulty starting urine stream.   Need to urinate several times at night (nocturia).   Weak urine stream.   Dribbling after urination.   Urethral discharge.  For Chronic prostatitis and chronic pelvic pain syndrome (CPPS) Symptoms are the same as those for chronic bacterial prostatitis. Problems with sexual function are often the reason for seeking care. This important problem should be discussed with your caregiver. For Asymptomatic inflammatory prostatitis As noted above, there are no symptoms with this condition. DIAGNOSIS   Your caregiver may perform a rectal exam. This exam is to determine if the prostate is swollen and tender.   Sometimes blood work is performed. This is done to see if your white blood cell count is elevated. The Prostate Specific Antigen (PSA) is also measured. PSA is a blood test that can help detect early prostate cancer.  A urinalysis is done to find out what type of infection is present if this is a suspected cause. An additional urinalysis may be done after a digital rectal exam. This is to see if white blood cells are pushed out of the prostate and into the urine. A low-grade infection of the prostate may not  be found on the first urinalysis.  In more difficult cases, your caregiver may advise other tests. Tests could include:  Urodynamics -- Tests the function of the bladder and the organs involved in triggering and controlling normal urination.   Urine flow rate.   Cystoscopy -- In this procedure, a thin, telescope-like tube with a light and tiny camera attached (cystoscope) is inserted into the bladder through the urethra. This allows the caregiver to see the inside of the urethra and bladder.   Electromyography -- This procedure tests how the muscles and nerves of the bladder work. It is focused on the muscles that control the anus and pelvic floor. These are the muscles between the anus and scrotum.  In people who show no signs of infection, certain uncommon infections might be causing constant or recurrent symptoms. These uncommon infections are difficult to detect. More work in medicine may help find solutions to these problems. TREATMENT  Antibiotics are used to treat infections caused by germs. If the infection is not treated and becomes long lasting (chronic), it may become a lower grade infection with minor, continual problems. Without treatment, the prostate may develop a boil or furuncle (abscess). This may require surgical treatment. For those with chronic prostatitis and CPPS, it is important to work closely with your primary caregiver and urologist. For some, the medicines that are used to treat a non-cancerous, enlarged prostate (benign prostatic hypertrophy) may be helpful. Referrals to specialists other than urologists may be necessary. In rare cases when all treatments have been inadequate for pain control, an operation to remove the prostate may be recommended. This is very rare and before this is considered thorough discussion with your urologist is highly recommended.  In cases of secondary to chronic non-bacterial prostatitis, a good relationship with your urologist or primary  caregiver is essential because it is often a recurrent prolonged condition that requires a good understanding of the causes and a commitment to therapy aimed at controlling your symptoms. HOME CARE INSTRUCTIONS   Hot sitz baths for 20 minutes, 4 times per day, may help relieve pain.   Non-prescription pain killers may be used as your caregiver recommends if you have no allergies to them. Some illnesses or conditions prevent use of non-prescription drugs. If unsure, check with your caregiver. Take all medications as directed. Take the antibiotics for the prescribed length of time, even if you are feeling better.  SEEK MEDICAL CARE IF:   You have any worsening of the symptoms that originally brought you to your caregiver.   You have an oral temperature above 102 F (38.9 C).   You experience any side effects from medications prescribed.  SEEK IMMEDIATE MEDICAL CARE IF:   You have an oral temperature above 102 F (38.9 C), not controlled by medicine.   You have pain not relieved with medications.   You develop nausea, vomiting, lightheadedness, or have a fainting episode.   You are unable to urinate.   You pass bloody urine or clots.  Document Released: 06/28/2000 Document Revised: 06/20/2011 Document Reviewed: 06/03/2011 [facility] Patient Information 2012 [place], [place].

## 2012-03-02 NOTE — Progress Notes (Signed)
Subjective:    Patient ID: Calvin Burnett, male    DOB: 07/08/1934, 78 y.o.   MRN: [id number]  HPI  Patient is a 78 year old male who presents for 2 complaints #1 is he had gross hematuria times one with some pain with ejaculation and pain on urination.  He does not have any low back pain fever or chills he does not have a history of chronic prostatitis.  The patient also has some increased confusion as placing items forgetfulness that his wife is concerned that might represent Alzheimer's.  He passes a clock test easily  Review of Systems  Constitutional: Negative for fever and fatigue.  HENT: Negative for hearing loss, congestion, neck pain and postnasal drip.   Eyes: Negative for discharge, redness and visual disturbance.  Respiratory: Negative for cough, shortness of breath and wheezing.   Cardiovascular: Negative for leg swelling.  Gastrointestinal: Negative for abdominal pain, constipation and abdominal distention.  Genitourinary: Positive for urgency, hematuria and penile pain. Negative for frequency.  Musculoskeletal: Negative for joint swelling and arthralgias.  Skin: Negative for color change and rash.  Neurological: Negative for weakness and light-headedness.  Hematological: Negative for adenopathy.  Psychiatric/Behavioral: Negative for behavioral problems.   Past Medical History  Diagnosis Date  . Hypertension   . GERD (gastroesophageal reflux disease)   . Diabetes mellitus     History   Social History  . Marital Status: Married    Spouse Name: N/A    Number of Children: N/A  . Years of Education: N/A   Occupational History  . retired    Social History Main Topics  . Smoking status: Former Smoker    Quit date: 07/15/1990  . Smokeless tobacco: Not on file  . Alcohol Use: No  . Drug Use: No  . Sexually Active: Yes   Other Topics Concern  . Not on file   Social History Narrative  . No narrative on file    Past Surgical History  Procedure Date    . Appendectomy   . Cholecystectomy     Family History  Problem Relation Age of Onset  . Lung cancer    . COPD Mother   . Cancer Father     lung    No Known Allergies  Current Outpatient Prescriptions on File Prior to Visit  Medication Sig Dispense Refill  . amitriptyline (ELAVIL) 10 MG tablet TAKE 1 TABLET EVERY EVENING AT BEDTIME  30 tablet  5  . aspirin 81 MG tablet Take 81 mg by mouth daily.        Marland Kitchen glipiZIDE (GLUCOTROL XL) 5 MG 24 hr tablet Take 1 tablet (5 mg total) by mouth daily.  30 tablet  11  . glucose blood (ACCU-CHEK AVIVA) test strip 1 each by Other route 2 (two) times daily. Use as instructed       . glucose blood (BAYER CONTOUR NEXT TEST) test strip Use as instructed  100 each  12  . ramipril (ALTACE) 5 MG capsule TAKE 1 CAPSULE BY MOUTH DAILY.  30 capsule  6  . ranitidine (ZANTAC) 150 MG capsule TAKE 2 CAPSULES BY MOUTH EVERY DAY  180 capsule  1  . sertraline (ZOLOFT) 50 MG tablet Take 1 tablet (50 mg total) by mouth daily.  30 tablet  3  . DISCONTD: ranitidine (ZANTAC) 300 MG capsule Take 300 mg by mouth every evening.          BP 130/80  Pulse 72  Temp 98.6 F (37 C)  Resp  16  Ht 5\' 10"  (1.778 m)  Wt 188 lb (85.276 kg)  BMI 26.98 kg/m2       Objective:   Physical Exam  Constitutional: He appears well-developed and well-nourished.  HENT:  Head: Normocephalic and atraumatic.  Eyes: Conjunctivae are normal. Pupils are equal, round, and reactive to light.  Neck: Normal range of motion. Neck supple.  Cardiovascular: Normal rate and regular rhythm.   Pulmonary/Chest: Effort normal and breath sounds normal.  Abdominal: Soft. Bowel sounds are normal.  Genitourinary:       Markedly enlarged boggy prostate          Assessment & Plan:  Situational depression and anxiety we'll try Zoloft 50 mg by mouth daily.  Acute prostatitis will treat with Cipro 500 mg by mouth twice a day for 14 days I believe this is the proximal cause of hematuria however  the hematuria persists cytology should be pursued.  Stable hypertension No evidence for Alzheimer's

## 2012-03-04 LAB — URINE CULTURE: Organism ID, Bacteria: NO GROWTH

## 2012-03-24 ENCOUNTER — Other Ambulatory Visit: Payer: Self-pay | Admitting: Internal Medicine

## 2012-04-07 ENCOUNTER — Other Ambulatory Visit: Payer: Self-pay | Admitting: Internal Medicine

## 2012-04-09 ENCOUNTER — Other Ambulatory Visit: Payer: Self-pay | Admitting: Internal Medicine

## 2012-04-15 ENCOUNTER — Encounter: Payer: Self-pay | Admitting: Gastroenterology

## 2012-05-01 ENCOUNTER — Other Ambulatory Visit: Payer: Self-pay | Admitting: Internal Medicine

## 2012-05-06 ENCOUNTER — Ambulatory Visit (INDEPENDENT_AMBULATORY_CARE_PROVIDER_SITE_OTHER): Payer: 59 | Admitting: Internal Medicine

## 2012-05-06 ENCOUNTER — Encounter: Payer: Self-pay | Admitting: Internal Medicine

## 2012-05-06 VITALS — BP 120/70 | HR 76 | Temp 98.6°F | Resp 16 | Ht 70.0 in | Wt 182.0 lb

## 2012-05-06 DIAGNOSIS — Z23 Encounter for immunization: Secondary | ICD-10-CM

## 2012-05-06 DIAGNOSIS — R972 Elevated prostate specific antigen [PSA]: Secondary | ICD-10-CM

## 2012-05-06 NOTE — Patient Instructions (Signed)
The patient is instructed to continue all medications as prescribed. Schedule followup with check out clerk upon leaving the clinic  

## 2012-05-06 NOTE — Progress Notes (Signed)
  Subjective:    Patient ID: Calvin Burnett, male    DOB: Jul 20, 1933, 78 y.o.   MRN: [id number]  HPI  Follow up HTN stable Follow up for DM with CBG today 129 Follow up elevated PSA  Review of Systems  Constitutional: Negative for fever and fatigue.  HENT: Negative for hearing loss, congestion, neck pain and postnasal drip.   Eyes: Negative for discharge, redness and visual disturbance.  Respiratory: Negative for cough, shortness of breath and wheezing.   Cardiovascular: Negative for leg swelling.  Gastrointestinal: Negative for abdominal pain, constipation and abdominal distention.  Genitourinary: Negative for urgency and frequency.  Musculoskeletal: Negative for joint swelling and arthralgias.  Skin: Negative for color change and rash.  Neurological: Negative for weakness and light-headedness.  Hematological: Negative for adenopathy.  Psychiatric/Behavioral: Negative for behavioral problems.       Objective:   Physical Exam  Constitutional: He appears well-developed and well-nourished.  HENT:  Head: Normocephalic and atraumatic.  Eyes: Conjunctivae normal are normal. Pupils are equal, round, and reactive to light.  Neck: Normal range of motion. Neck supple.  Cardiovascular: Normal rate and regular rhythm.   Pulmonary/Chest: Effort normal and breath sounds normal.  Abdominal: Soft. Bowel sounds are normal.          Assessment & Plan:  Repeat PSA today after having had an antibiotic course his PSA is reduced continue to monitor with serial measurements if PSA continues to increase we'll refer her to a urologist for biopsy

## 2012-05-07 LAB — PSA, TOTAL AND FREE
PSA, Free: 0.55 ng/mL
PSA: 5.49 ng/mL — ABNORMAL HIGH (ref <=4.00)

## 2012-05-19 ENCOUNTER — Other Ambulatory Visit: Payer: Self-pay | Admitting: Internal Medicine

## 2012-05-19 DIAGNOSIS — R972 Elevated prostate specific antigen [PSA]: Secondary | ICD-10-CM

## 2012-07-07 ENCOUNTER — Other Ambulatory Visit: Payer: Self-pay | Admitting: Internal Medicine

## 2012-07-22 ENCOUNTER — Ambulatory Visit (INDEPENDENT_AMBULATORY_CARE_PROVIDER_SITE_OTHER): Payer: 59 | Admitting: Internal Medicine

## 2012-07-22 ENCOUNTER — Encounter: Payer: Self-pay | Admitting: Internal Medicine

## 2012-07-22 VITALS — BP 126/80 | HR 72 | Temp 97.5°F | Resp 16 | Ht 70.0 in | Wt 184.0 lb

## 2012-07-22 DIAGNOSIS — R413 Other amnesia: Secondary | ICD-10-CM

## 2012-07-22 DIAGNOSIS — E1165 Type 2 diabetes mellitus with hyperglycemia: Secondary | ICD-10-CM

## 2012-07-22 DIAGNOSIS — E1169 Type 2 diabetes mellitus with other specified complication: Secondary | ICD-10-CM

## 2012-07-22 DIAGNOSIS — C44621 Squamous cell carcinoma of skin of unspecified upper limb, including shoulder: Secondary | ICD-10-CM

## 2012-07-22 DIAGNOSIS — IMO0002 Reserved for concepts with insufficient information to code with codable children: Secondary | ICD-10-CM

## 2012-07-22 DIAGNOSIS — I10 Essential (primary) hypertension: Secondary | ICD-10-CM

## 2012-07-22 LAB — HEMOGLOBIN A1C: Hgb A1c MFr Bld: 6.8 % — ABNORMAL HIGH (ref 4.6–6.5)

## 2012-07-22 NOTE — Progress Notes (Signed)
  Subjective:    Patient ID: Calvin Burnett, male    DOB: May 02, 1934, 79 y.o.   MRN: [id number]  HPI Patient referred to urologist for elevated PSA and a biopsy is planned.  His history of adult-onset diabetes and we will review his A1c's Was noted to have gross hematuria which is also being followed by urology   Review of Systems     Objective:   Physical Exam        Assessment & Plan:

## 2012-07-22 NOTE — Patient Instructions (Signed)
You should hear from my office for further skin cancer specialist this week

## 2012-08-12 DIAGNOSIS — C61 Malignant neoplasm of prostate: Secondary | ICD-10-CM

## 2012-08-12 HISTORY — DX: Malignant neoplasm of prostate: C61

## 2012-08-12 HISTORY — PX: PROSTATE BIOPSY: SHX241

## 2012-09-07 ENCOUNTER — Telehealth: Payer: Self-pay | Admitting: Internal Medicine

## 2012-09-07 MED ORDER — GLIPIZIDE ER 5 MG PO TB24: 5.0000 mg | ORAL_TABLET | Freq: Every day | ORAL | Status: DC

## 2012-09-07 NOTE — Telephone Encounter (Signed)
Pt needs refill of: glipiZIDE (GLUCOTROL XL) 5 MG 24 hr tablet Pt is in [facility]  [facility] FA[facility]  [phone]

## 2012-09-07 NOTE — Telephone Encounter (Signed)
Done

## 2012-09-22 ENCOUNTER — Other Ambulatory Visit: Payer: Self-pay | Admitting: Internal Medicine

## 2012-10-02 ENCOUNTER — Other Ambulatory Visit: Payer: Self-pay | Admitting: Internal Medicine

## 2012-10-13 ENCOUNTER — Encounter: Payer: Self-pay | Admitting: Radiation Oncology

## 2012-10-14 ENCOUNTER — Ambulatory Visit
Admission: RE | Admit: 2012-10-14 | Discharge: 2012-10-14 | Disposition: A | Discharge status: Home / Self Care | Payer: Medicare Other | Source: Ambulatory Visit | Attending: Radiation Oncology | Admitting: Radiation Oncology

## 2012-10-14 ENCOUNTER — Encounter: Payer: Self-pay | Admitting: Radiation Oncology

## 2012-10-14 VITALS — BP 132/88 | HR 65 | Temp 97.8°F | Resp 20 | Ht 70.0 in | Wt 182.7 lb

## 2012-10-14 DIAGNOSIS — K219 Gastro-esophageal reflux disease without esophagitis: Secondary | ICD-10-CM | POA: Insufficient documentation

## 2012-10-14 DIAGNOSIS — I10 Essential (primary) hypertension: Secondary | ICD-10-CM | POA: Insufficient documentation

## 2012-10-14 DIAGNOSIS — C61 Malignant neoplasm of prostate: Secondary | ICD-10-CM

## 2012-10-14 DIAGNOSIS — E119 Type 2 diabetes mellitus without complications: Secondary | ICD-10-CM | POA: Insufficient documentation

## 2012-10-14 HISTORY — DX: Malignant neoplasm of prostate: C61

## 2012-10-14 HISTORY — DX: Male erectile dysfunction, unspecified: N52.9

## 2012-10-14 NOTE — Progress Notes (Signed)
Radiation Oncology         [phone] ________________________________  Initial outpatient Consultation  Name: Calvin Burnett MRN: [id number]  Date: 10/14/2012  DOB: 1933/11/07  WU:[id number],NWGN EDWARD, MD  Marcine Matar, MD   REFERRING PHYSICIAN: Marcine Matar, MD  DIAGNOSIS: 79 y.o. gentleman with stage T1c adenocarcinoma of the prostate with a Gleason's score of 3+4 and a PSA of 5.65  HISTORY OF PRESENT ILLNESS::Calvin Burnett is a 79 y.o. gentleman.  He was noted to have an elevated PSA of and 5.12 in 4/13 and 5.49 and 10/13 by his primary care physician, Dr. Lovell Sheehan.  Accordingly, he was referred for evaluation in urology by Dr. Retta Diones on 06/29/2012,  digital rectal examination was performed at that time revealing a 2+ gland with no nodules. His PSA level was rechecked at that time and found to be further elevated at 5.65.  The patient proceeded to transrectal ultrasound with 12 biopsies of the prostate on 08/12/2012.  The prostate volume measured 34.9 cc.  Out of 12 core biopsies, 3 were positive.  The maximum Gleason score was 3+4, and this was seen in the left lateral apex. Gleason's 3+3 was seen in the right and left apex specimens..  The patient reviewed the biopsy results with his urologist and he has kindly been referred today for discussion of potential radiation treatment options.  PREVIOUS RADIATION THERAPY: No  PAST MEDICAL HISTORY:  has a past medical history of Hypertension; GERD (gastroesophageal reflux disease); Prostate cancer (08/12/12); ED (erectile dysfunction); and Diabetes mellitus.    PAST SURGICAL HISTORY: Past Surgical History  Procedure Laterality Date  . Appendectomy    . Cholecystectomy      25 years ago  . Prostate biopsy  08/12/12    Adenocarcinoma    FAMILY HISTORY: family history includes COPD in his mother; Cancer in his father; Emphysema in his mother; and Lung cancer in an unspecified family member.  SOCIAL HISTORY:  reports that  he quit smoking about 22 years ago. His smoking use included Cigars. He has never used smokeless tobacco. He reports that he does not drink alcohol or use illicit drugs.  ALLERGIES: Review of patient's allergies indicates no known allergies.  MEDICATIONS:  Current Outpatient Prescriptions  Medication Sig Dispense Refill  . amitriptyline (ELAVIL) 10 MG tablet Take 10 mg by mouth at bedtime.      Marland Kitchen aspirin 81 MG tablet Take 81 mg by mouth daily.      Marland Kitchen glipiZIDE (GLUCOTROL) 5 MG tablet Take 5 mg by mouth daily. Er 24hr      . ramipril (ALTACE) 5 MG tablet Take 5 mg by mouth daily.      . ranitidine (ZANTAC) 150 MG tablet Take 150 mg by mouth 2 (two) times daily.      . sertraline (ZOLOFT) 50 MG tablet Take 50 mg by mouth daily.       No current facility-administered medications for this encounter.    REVIEW OF SYSTEMS:  A 15 point review of systems is documented in the electronic medical record. This was obtained by the nursing staff. However, I reviewed this with the patient to discuss relevant findings and make appropriate changes.  A comprehensive review of systems was negative..  The patient completed an IPSS and IIEF questionnaire.  His IPSS score was 0 indicating mild urinary outflow obstructive symptoms.  He indicated that his erectile function is capable to complete sexual activity on all every attempt.   PHYSICAL EXAM: This patient is in  no acute distress.  He is alert and oriented.   height is 5\' 10"  (1.778 m) and weight is 182 lb 11.2 oz (82.872 kg). His oral temperature is 97.8 F (36.6 C). His blood pressure is 132/88 and his pulse is 65. His respiration is 20.  He exhibits no respiratory distress or labored breathing.  He appears neurologically intact.  His mood is pleasant.  His affect is appropriate.  Please note the digital rectal exam findings described above.  LABORATORY DATA:  Lab Results  Component Value Date   WBC 6.0 12/25/2009   HGB 15.6 12/25/2009   HCT 44.6 12/25/2009     MCV 93.9 12/25/2009   PLT 216.0 12/25/2009   Lab Results  Component Value Date   NA 141 10/30/2011   K 4.9 10/30/2011   CL 106 10/30/2011   CO2 27 10/30/2011   Lab Results  Component Value Date   ALT 21 10/30/2011   AST 22 10/30/2011   ALKPHOS 74 10/30/2011   BILITOT 0.6 10/30/2011     RADIOGRAPHY: No results found.    IMPRESSION: This gentleman is a 79 y.o. gentleman with stage T1c adenocarcinoma of the prostate with a Gleason's score of 3+4 and a PSA of 5.65.  His T-Stage, Gleason's Score, and PSA put him into the intermediate risk group. His low volume of disease and primary Gleason's grade of 3 place him into a select subset of intermediate risk patients eligible for prostate seed implant as monotherapy. Accordingly he is eligible for a variety of potential treatment options including radical prostatectomy, external beam radiation, or prostate brachytherapy. He's not ideally suited for active surveillance given the presence of intermediate risk disease.  PLAN:Today I reviewed the findings and workup thus far.  We discussed the natural history of prostate cancer.  We reviewed the the implications of T-stage, Gleason's Score, and PSA on decision-making and outcomes in prostate cancer.  We discussed radiation treatment in the management of prostate cancer with regard to the logistics and delivery of external beam radiation treatment as well as the logistics and delivery of prostate brachytherapy.  We compared and contrasted each of these approaches and also compared these against prostatectomy.  The patient expressed interest in prostate brachytherapy.  I filled out a patient counseling form for him with relevant treatment diagrams and we retained a copy for our records.   The patient would like to proceed with prostate brachytherapy.  I will share my findings with Dr. Retta Diones and move forward with scheduling the procedure in the near future.     I enjoyed meeting with him today, and will look  forward to participating in the care of this very nice gentleman.   I spent 60 minutes face to face with the patient and more than 50% of that time was spent in counseling and/or coordination of care.   ------------------------------------------------  Artist Pais. Kathrynn Running, M.D.

## 2012-10-14 NOTE — Progress Notes (Signed)
Please see the Nurse Progress Note in the MD Initial Consult Encounter for this patient. 

## 2012-10-14 NOTE — Progress Notes (Addendum)
New Consult Prostate Cancer Dx biopsy1/29/14=Adenocarcinoma,gleason=3+3=6, & 3+4=7,PSA+5.65,Volume=34.9cc  Alert,oriented x3, married, 6 children, no c/o dysuria, no hesitancy, no nocturia, regular bowel movements, no nausea Father deceased Lung Ca age 77,  Interested in seed implant  Allergies:NKDA No History  Radiation Therapy\ No History of a Pacemaker

## 2012-10-14 NOTE — Progress Notes (Signed)
  Radiation Oncology         [phone] ________________________________  Name: Calvin Burnett MRN: [id number]  Date: 10/14/2012  DOB: [birth year]  SIMULATION AND TREATMENT PLANNING NOTE PUBIC ARCH STUDY  NW:[id number],YQMV Ramon Dredge, MD  Marcine Matar, MD  DIAGNOSIS: 79 y.o. 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 He was brought to the radiation planning suite and placed supine on the CT couch. A 3-dimensional image study set was obtained in upload to the planning computer. There, on each axial slice, I contoured the prostate gland. Then, using three-dimensional radiation planning tools I reconstructed the prostate in view of the structures from the transperineal needle pathway to assess for possible pubic arch interference. In doing so, I did not appreciate any pubic arch interference. Also, the patient's prostate volume was estimated based on the drawn structure. The volume was 38 cc.  Given the pubic arch appearance and prostate volume, patient remains a good candidate to proceed with prostate seed implant. Today, he freely provided informed written consent to proceed.    PLAN: The patient will undergo prostate seed implant.   ________________________________  Artist Pais. Kathrynn Running, M.D.

## 2012-10-15 ENCOUNTER — Telehealth: Payer: Self-pay | Admitting: *Deleted

## 2012-10-15 NOTE — Telephone Encounter (Signed)
CALLED PATIENT TO INFORM OF IMPLANT, LVM FOR A RETURN CALL 

## 2012-10-16 ENCOUNTER — Other Ambulatory Visit: Payer: Self-pay

## 2012-10-16 ENCOUNTER — Encounter (HOSPITAL_BASED_OUTPATIENT_CLINIC_OR_DEPARTMENT_OTHER)
Admission: RE | Admit: 2012-10-16 | Discharge: 2012-10-16 | Disposition: A | Discharge status: Home / Self Care | Payer: Medicare Other | Source: Ambulatory Visit | Attending: Urology | Admitting: Urology

## 2012-10-16 ENCOUNTER — Ambulatory Visit (HOSPITAL_BASED_OUTPATIENT_CLINIC_OR_DEPARTMENT_OTHER)
Admission: RE | Admit: 2012-10-16 | Discharge: 2012-10-16 | Disposition: A | Discharge status: Home / Self Care | Payer: Medicare Other | Source: Ambulatory Visit | Attending: Urology | Admitting: Urology

## 2012-10-16 DIAGNOSIS — Z01818 Encounter for other preprocedural examination: Secondary | ICD-10-CM | POA: Insufficient documentation

## 2012-10-16 DIAGNOSIS — E119 Type 2 diabetes mellitus without complications: Secondary | ICD-10-CM | POA: Insufficient documentation

## 2012-10-16 DIAGNOSIS — Z87891 Personal history of nicotine dependence: Secondary | ICD-10-CM | POA: Insufficient documentation

## 2012-10-16 DIAGNOSIS — C61 Malignant neoplasm of prostate: Secondary | ICD-10-CM | POA: Insufficient documentation

## 2012-10-26 ENCOUNTER — Other Ambulatory Visit: Payer: Self-pay | Admitting: Internal Medicine

## 2012-11-02 ENCOUNTER — Encounter: Payer: Self-pay | Admitting: Radiation Oncology

## 2012-11-05 ENCOUNTER — Other Ambulatory Visit: Payer: Self-pay | Admitting: Internal Medicine

## 2012-11-09 ENCOUNTER — Ambulatory Visit: Payer: Medicare Other

## 2012-11-09 ENCOUNTER — Ambulatory Visit: Payer: Medicare Other | Admitting: Radiation Oncology

## 2012-11-18 ENCOUNTER — Ambulatory Visit (INDEPENDENT_AMBULATORY_CARE_PROVIDER_SITE_OTHER): Payer: Medicare Other | Admitting: Internal Medicine

## 2012-11-18 ENCOUNTER — Encounter: Payer: Self-pay | Admitting: Internal Medicine

## 2012-11-18 VITALS — BP 130/80 | HR 72 | Temp 98.2°F | Resp 16 | Ht 70.0 in | Wt 184.0 lb

## 2012-11-18 DIAGNOSIS — E1169 Type 2 diabetes mellitus with other specified complication: Secondary | ICD-10-CM

## 2012-11-18 DIAGNOSIS — E1165 Type 2 diabetes mellitus with hyperglycemia: Secondary | ICD-10-CM

## 2012-11-18 DIAGNOSIS — I10 Essential (primary) hypertension: Secondary | ICD-10-CM

## 2012-11-18 DIAGNOSIS — C61 Malignant neoplasm of prostate: Secondary | ICD-10-CM

## 2012-11-18 LAB — BASIC METABOLIC PANEL
Calcium: 8.8 mg/dL (ref 8.4–10.5)
Chloride: 105 mEq/L (ref 96–112)
Creatinine, Ser: 1.3 mg/dL (ref 0.4–1.5)

## 2012-11-18 LAB — HEMOGLOBIN A1C: Hgb A1c MFr Bld: 6.8 % — ABNORMAL HIGH (ref 4.6–6.5)

## 2012-11-18 MED ORDER — GLUCOSE BLOOD VI STRP: 1.0000 | ORAL_STRIP | Freq: Every day | Status: DC

## 2012-11-18 NOTE — Progress Notes (Signed)
Subjective:    Patient ID: Calvin Burnett, male    DOB: 05-31-1934, 79 y.o.   MRN: [id number]  HPI  Patient has prostate CA and has the radioactive seed placement Has seen radiation oncology Stable HTN CBG's are in 130-140 range weight stable Skin cancer treatment reveiwed  Review of Systems  Constitutional: Negative for fever and fatigue.  HENT: Negative for hearing loss, congestion, neck pain and postnasal drip.   Eyes: Negative for discharge, redness and visual disturbance.  Respiratory: Negative for cough, shortness of breath and wheezing.   Cardiovascular: Negative for leg swelling.  Gastrointestinal: Negative for abdominal pain, constipation and abdominal distention.  Genitourinary: Negative for urgency and frequency.  Musculoskeletal: Negative for joint swelling and arthralgias.  Skin: Negative for color change and rash.  Neurological: Negative for weakness and light-headedness.  Hematological: Negative for adenopathy.  Psychiatric/Behavioral: Negative for behavioral problems.   Past Medical History  Diagnosis Date  . Hypertension   . GERD (gastroesophageal reflux disease)   . Prostate cancer 08/12/12    gleason 3+3=6,& 3+4=7,PSA=5.65,volume=34.9cc  . ED (erectile dysfunction)     mild  . Diabetes mellitus     TYPE II    History   Social History  . Marital Status: Married    Spouse Name: N/A    Number of Children: N/A  . Years of Education: N/A   Occupational History  . retired    Social History Main Topics  . Smoking status: Former Smoker -- 1.00 packs/day for 2 years    Types: Cigars    Quit date: 07/15/1990  . Smokeless tobacco: Never Used  . Alcohol Use: No  . Drug Use: No     Comment: quit smoking 40 years ago  . Sexually Active: Yes   Other Topics Concern  . Not on file   Social History Narrative  . No narrative on file    Past Surgical History  Procedure Laterality Date  . Appendectomy    . Cholecystectomy      25 years ago  .  Prostate biopsy  08/12/12    Adenocarcinoma    Family History  Problem Relation Age of Onset  . Lung cancer    . COPD Mother   . Emphysema Mother   . Cancer Father     lung    No Known Allergies  Current Outpatient Prescriptions on File Prior to Visit  Medication Sig Dispense Refill  . amitriptyline (ELAVIL) 10 MG tablet Take 10 mg by mouth at bedtime.      Marland Kitchen aspirin 81 MG tablet Take 81 mg by mouth daily.      Marland Kitchen glipiZIDE (GLUCOTROL) 5 MG tablet Take 5 mg by mouth daily. Er 24hr      . ramipril (ALTACE) 5 MG tablet Take 5 mg by mouth daily.      . ranitidine (ZANTAC) 150 MG capsule TAKE 2 CAPSULES BY MOUTH EVERY DAY  180 capsule  1  . sertraline (ZOLOFT) 50 MG tablet TAKE 1 TABLET BY MOUTH EVERY DAY  30 tablet  0   No current facility-administered medications on file prior to visit.    BP 130/80  Pulse 72  Temp(Src) 98.2 F (36.8 C)  Resp 16  Ht 5\' 10"  (1.778 m)  Wt 184 lb (83.462 kg)  BMI 26.4 kg/m2       Objective:   Physical Exam  Nursing note reviewed. Constitutional: He appears well-developed and well-nourished.  HENT:  Head: Normocephalic and atraumatic.  Eyes: Conjunctivae are  normal. Pupils are equal, round, and reactive to light.  Neck: Normal range of motion. Neck supple.  Cardiovascular: Normal rate and regular rhythm.   Murmur heard. Pulmonary/Chest: Effort normal and breath sounds normal.  Abdominal: Soft. Bowel sounds are normal.          Assessment & Plan:  Stable HTN  Discussion of treament for prostate cancer Monitoring DM with a1c and bmet

## 2012-11-25 ENCOUNTER — Telehealth: Payer: Self-pay | Admitting: *Deleted

## 2012-11-25 NOTE — Telephone Encounter (Signed)
Called patient to remind of appt., lvm for a return call 

## 2012-11-26 LAB — COMPREHENSIVE METABOLIC PANEL WITH GFR
ALT: 14 U/L (ref 0–53)
AST: 16 U/L (ref 0–37)
Albumin: 3.7 g/dL (ref 3.5–5.2)
Alkaline Phosphatase: 88 U/L (ref 39–117)
BUN: 14 mg/dL (ref 6–23)
CO2: 28 meq/L (ref 19–32)
Calcium: 9.3 mg/dL (ref 8.4–10.5)
Chloride: 101 meq/L (ref 96–112)
Creatinine, Ser: 1.33 mg/dL (ref 0.50–1.35)
GFR calc Af Amer: 58 mL/min — ABNORMAL LOW
GFR calc non Af Amer: 50 mL/min — ABNORMAL LOW
Glucose, Bld: 193 mg/dL — ABNORMAL HIGH (ref 70–99)
Potassium: 4 meq/L (ref 3.5–5.1)
Sodium: 139 meq/L (ref 135–145)
Total Bilirubin: 0.4 mg/dL (ref 0.3–1.2)
Total Protein: 7.1 g/dL (ref 6.0–8.3)

## 2012-11-26 LAB — PROTIME-INR
INR: 0.96 (ref 0.00–1.49)
Prothrombin Time: 12.7 s (ref 11.6–15.2)

## 2012-11-26 LAB — CBC
HCT: 41.7 % (ref 39.0–52.0)
Hemoglobin: 14.6 g/dL (ref 13.0–17.0)
MCH: 31.7 pg (ref 26.0–34.0)
MCHC: 35 g/dL (ref 30.0–36.0)
MCV: 90.5 fL (ref 78.0–100.0)
Platelets: 198 K/uL (ref 150–400)
RBC: 4.61 MIL/uL (ref 4.22–5.81)
RDW: 12.8 % (ref 11.5–15.5)
WBC: 6 K/uL (ref 4.0–10.5)

## 2012-12-01 ENCOUNTER — Encounter (HOSPITAL_BASED_OUTPATIENT_CLINIC_OR_DEPARTMENT_OTHER): Payer: Self-pay | Admitting: *Deleted

## 2012-12-01 NOTE — Progress Notes (Signed)
NPO AFTER MN. ARRIVES AT 0815. CURRENT LAB RESULTS , CXR AND EKG IN EPIC AND CHART. WILL DO FLEET ENEMA AM OF SURG.

## 2012-12-02 ENCOUNTER — Telehealth: Payer: Self-pay | Admitting: *Deleted

## 2012-12-02 NOTE — Telephone Encounter (Signed)
Called patient to remind of procedure for 12-03-12, spoke with patient and he is aware of this procedure.
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
## 2012-12-03 ENCOUNTER — Ambulatory Visit (HOSPITAL_BASED_OUTPATIENT_CLINIC_OR_DEPARTMENT_OTHER)
Admission: RE | Admit: 2012-12-03 | Discharge: 2012-12-03 | Disposition: A | Discharge status: Home / Self Care | Payer: Medicare Other | Source: Ambulatory Visit | Attending: Urology | Admitting: Urology

## 2012-12-03 ENCOUNTER — Encounter (HOSPITAL_BASED_OUTPATIENT_CLINIC_OR_DEPARTMENT_OTHER)
Admission: RE | Disposition: A | Discharge status: Home / Self Care | Payer: Self-pay | Source: Ambulatory Visit | Attending: Urology

## 2012-12-03 ENCOUNTER — Ambulatory Visit (HOSPITAL_COMMUNITY): Payer: Medicare Other

## 2012-12-03 ENCOUNTER — Encounter (HOSPITAL_BASED_OUTPATIENT_CLINIC_OR_DEPARTMENT_OTHER): Payer: Self-pay | Admitting: Anesthesiology

## 2012-12-03 ENCOUNTER — Ambulatory Visit (HOSPITAL_BASED_OUTPATIENT_CLINIC_OR_DEPARTMENT_OTHER): Payer: Medicare Other | Admitting: Anesthesiology

## 2012-12-03 DIAGNOSIS — C61 Malignant neoplasm of prostate: Secondary | ICD-10-CM | POA: Insufficient documentation

## 2012-12-03 DIAGNOSIS — Z85828 Personal history of other malignant neoplasm of skin: Secondary | ICD-10-CM | POA: Insufficient documentation

## 2012-12-03 DIAGNOSIS — K219 Gastro-esophageal reflux disease without esophagitis: Secondary | ICD-10-CM | POA: Insufficient documentation

## 2012-12-03 DIAGNOSIS — Z87891 Personal history of nicotine dependence: Secondary | ICD-10-CM | POA: Insufficient documentation

## 2012-12-03 DIAGNOSIS — I10 Essential (primary) hypertension: Secondary | ICD-10-CM | POA: Insufficient documentation

## 2012-12-03 DIAGNOSIS — E119 Type 2 diabetes mellitus without complications: Secondary | ICD-10-CM | POA: Insufficient documentation

## 2012-12-03 HISTORY — DX: Other specified postprocedural states: Z98.890

## 2012-12-03 HISTORY — DX: Personal history of other diseases of the circulatory system: Z86.79

## 2012-12-03 HISTORY — DX: Other specified postprocedural states: Z85.828

## 2012-12-03 HISTORY — PX: RADIOACTIVE SEED IMPLANT: SHX5150

## 2012-12-03 LAB — POCT I-STAT 4, (NA,K, GLUC, HGB,HCT)
Glucose, Bld: 149 mg/dL — ABNORMAL HIGH (ref 70–99)
Hemoglobin: 13.9 g/dL (ref 13.0–17.0)
Potassium: 4.2 mEq/L (ref 3.5–5.1)

## 2012-12-03 LAB — GLUCOSE, CAPILLARY: Glucose-Capillary: 156 mg/dL — ABNORMAL HIGH (ref 70–99)

## 2012-12-03 SURGERY — INSERTION, RADIATION SOURCE, PROSTATE
Anesthesia: General | Site: Prostate | Wound class: Clean Contaminated

## 2012-12-03 MED ORDER — PROPOFOL 10 MG/ML IV BOLUS
INTRAVENOUS | Status: DC | PRN
  Administered 2012-12-03: 250 mg via INTRAVENOUS

## 2012-12-03 MED ORDER — STERILE WATER FOR IRRIGATION IR SOLN
Status: DC | PRN
  Administered 2012-12-03: 3000 mL via INTRAVESICAL

## 2012-12-03 MED ORDER — OXYCODONE HCL 5 MG/5ML PO SOLN
5.0000 mg | Freq: Once | ORAL | Status: DC | PRN
  Filled 2012-12-03: qty 5

## 2012-12-03 MED ORDER — HYDROCODONE-ACETAMINOPHEN 5-325 MG PO TABS: 1.0000 | ORAL_TABLET | ORAL | Status: DC | PRN

## 2012-12-03 MED ORDER — OXYCODONE HCL 5 MG PO TABS
5.0000 mg | ORAL_TABLET | Freq: Once | ORAL | Status: DC | PRN
  Filled 2012-12-03: qty 1

## 2012-12-03 MED ORDER — ACETAMINOPHEN 10 MG/ML IV SOLN
INTRAVENOUS | Status: DC | PRN
  Administered 2012-12-03: 1000 mg via INTRAVENOUS

## 2012-12-03 MED ORDER — HYDROMORPHONE HCL PF 1 MG/ML IJ SOLN
0.2500 mg | INTRAMUSCULAR | Status: DC | PRN
  Filled 2012-12-03: qty 1

## 2012-12-03 MED ORDER — LIDOCAINE HCL (CARDIAC) 20 MG/ML IV SOLN
INTRAVENOUS | Status: DC | PRN
  Administered 2012-12-03: 75 mg via INTRAVENOUS

## 2012-12-03 MED ORDER — FLEET ENEMA 7-19 GM/118ML RE ENEM
1.0000 | ENEMA | Freq: Once | RECTAL | Status: DC
  Filled 2012-12-03: qty 1

## 2012-12-03 MED ORDER — EPHEDRINE SULFATE 50 MG/ML IJ SOLN
INTRAMUSCULAR | Status: DC | PRN
  Administered 2012-12-03 (×8): 10 mg via INTRAVENOUS
  Administered 2012-12-03: 15 mg via INTRAVENOUS

## 2012-12-03 MED ORDER — PROMETHAZINE HCL 25 MG/ML IJ SOLN
6.2500 mg | INTRAMUSCULAR | Status: DC | PRN
  Filled 2012-12-03: qty 1

## 2012-12-03 MED ORDER — DEXAMETHASONE SODIUM PHOSPHATE 4 MG/ML IJ SOLN
INTRAMUSCULAR | Status: DC | PRN
  Administered 2012-12-03: 8 mg via INTRAVENOUS

## 2012-12-03 MED ORDER — CIPROFLOXACIN IN D5W 400 MG/200ML IV SOLN
400.0000 mg | INTRAVENOUS | Status: AC
  Administered 2012-12-03: 400 mg via INTRAVENOUS
  Filled 2012-12-03: qty 200

## 2012-12-03 MED ORDER — ACETAMINOPHEN 10 MG/ML IV SOLN
1000.0000 mg | Freq: Once | INTRAVENOUS | Status: DC | PRN
  Filled 2012-12-03: qty 100

## 2012-12-03 MED ORDER — MEPERIDINE HCL 25 MG/ML IJ SOLN
6.2500 mg | INTRAMUSCULAR | Status: DC | PRN
  Filled 2012-12-03: qty 1

## 2012-12-03 MED ORDER — FENTANYL CITRATE 0.05 MG/ML IJ SOLN
INTRAMUSCULAR | Status: DC | PRN
  Administered 2012-12-03: 50 ug via INTRAVENOUS
  Administered 2012-12-03 (×3): 25 ug via INTRAVENOUS

## 2012-12-03 MED ORDER — LACTATED RINGERS IV SOLN
INTRAVENOUS | Status: DC
  Administered 2012-12-03 (×3): via INTRAVENOUS
  Filled 2012-12-03: qty 1000

## 2012-12-03 MED ORDER — CIPROFLOXACIN HCL 250 MG PO TABS: 250.0000 mg | ORAL_TABLET | Freq: Two times a day (BID) | ORAL | Status: DC

## 2012-12-03 MED ORDER — ONDANSETRON HCL 4 MG/2ML IJ SOLN
INTRAMUSCULAR | Status: DC | PRN
  Administered 2012-12-03: 4 mg via INTRAVENOUS

## 2012-12-03 MED ORDER — GLYCOPYRROLATE 0.2 MG/ML IJ SOLN
INTRAMUSCULAR | Status: DC | PRN
  Administered 2012-12-03 (×2): 0.1 mg via INTRAVENOUS

## 2012-12-03 SURGICAL SUPPLY — 24 items
BAG URINE DRAINAGE (UROLOGICAL SUPPLIES) ×2 IMPLANT
BLADE SURG ROTATE 9660 (MISCELLANEOUS) ×2 IMPLANT
CATH FOLEY 2WAY SLVR  5CC 16FR (CATHETERS) ×1
CATH FOLEY 2WAY SLVR 5CC 16FR (CATHETERS) ×1 IMPLANT
CATH ROBINSON RED A/P 20FR (CATHETERS) ×2 IMPLANT
CLOTH BEACON ORANGE TIMEOUT ST (SAFETY) ×2 IMPLANT
COVER MAYO STAND STRL (DRAPES) ×2 IMPLANT
COVER TABLE BACK 60X90 (DRAPES) ×2 IMPLANT
DRSG TEGADERM 4X4.75 (GAUZE/BANDAGES/DRESSINGS) ×2 IMPLANT
DRSG TEGADERM 8X12 (GAUZE/BANDAGES/DRESSINGS) ×2 IMPLANT
GLOVE BIO SURGEON STRL SZ7 (GLOVE) ×1 IMPLANT
GLOVE BIO SURGEON STRL SZ7.5 (GLOVE) IMPLANT
GLOVE BIO SURGEON STRL SZ8 (GLOVE) ×4 IMPLANT
GLOVE ECLIPSE 8.0 STRL XLNG CF (GLOVE) ×4 IMPLANT
GOWN PREVENTION PLUS LG XLONG (DISPOSABLE) ×2 IMPLANT
GOWN STRL REIN XL XLG (GOWN DISPOSABLE) ×2 IMPLANT
HOLDER FOLEY CATH W/STRAP (MISCELLANEOUS) ×2 IMPLANT
PACK CYSTOSCOPY (CUSTOM PROCEDURE TRAY) ×2 IMPLANT
SPONGE GAUZE 4X4 12PLY (GAUZE/BANDAGES/DRESSINGS) ×1 IMPLANT
SYRINGE 10CC LL (SYRINGE) ×2 IMPLANT
UNDERPAD 30X30 INCONTINENT (UNDERPADS AND DIAPERS) ×4 IMPLANT
WATER STERILE IRR 3000ML UROMA (IV SOLUTION) ×2 IMPLANT
WATER STERILE IRR 500ML POUR (IV SOLUTION) ×2 IMPLANT
slectseed ×1 IMPLANT

## 2012-12-03 NOTE — Op Note (Signed)
Preoperative diagnosis: Clinical stage TI C adenocarcinoma the prostate   Postoperative diagnosis: Same   Procedure: I-125 prostate seed implantation with Nucletron robotic implanter   Surgeon: Bertram Millard. Latavion Halls M.D.  Radiation Oncologist: Kathrynn Running, MD  Anesthesia: Gen.   Indications: Patient  was diagnosed with clinical stage TI C prostate cancer. We had extensive discussion with him about treatment options versus. He elected to proceed with seed implantation. He underwent consultation my office as well as with Dr. Kathrynn Running. He appeared to understand the advantages disadvantages potential risks of this treatment option. Full informed consent has been obtained.   Technique and findings: Patient was brought the operating room where he had successful induction of general anesthesia. He was placed in dorso-lithotomy position and prepped and draped in usual manner. Appropriate surgical timeout was performed. Radiation oncology department placed a transrectal ultrasound probe anchoring stand. Foley catheter with contrast in the balloon was inserted without difficulty. Anchoring needles were placed within the prostate. Rectal tube was placed. Real-time contouring of the urethra prostate and rectum were performed and the dosing parameters were established. Targeted dose was 145 gray.  I was then called  to the operating suite suite for placement of the needles. A second timeout was performed. All needle passage was done with real-time transrectal ultrasound guidance with the sagittal plane. A total of 22 needles were placed. The implantation itself was done with the robotic implanter. 80 active seeds were implanted. A Foley catheter was removed and flexible cystoscopy revealed 1 seed in the bladder which was removed. The remainder of the bladder was normal.  The patient was brought to recovery room in stable condition.

## 2012-12-03 NOTE — Anesthesia Postprocedure Evaluation (Signed)
  Anesthesia Post-op Note  Patient: Calvin Burnett  Procedure(s) Performed: Procedure(s) (LRB): RADIOACTIVE SEED IMPLANT (N/A)  Patient Location: PACU  Anesthesia Type: General  Level of Consciousness: awake and alert   Airway and Oxygen Therapy: Patient Spontanous Breathing  Post-op Pain: mild  Post-op Assessment: Post-op Vital signs reviewed, Patient's Cardiovascular Status Stable, Respiratory Function Stable, Patent Airway and No signs of Nausea or vomiting  Last Vitals:  Filed Vitals:   12/03/12 1200  BP: 138/84  Pulse: 71  Temp:   Resp: 10    Post-op Vital Signs: stable   Complications: No apparent anesthesia complications

## 2012-12-03 NOTE — Transfer of Care (Signed)
Immediate Anesthesia Transfer of Care Note  Patient: Calvin Burnett  Procedure(s) Performed: Procedure(s) (LRB): RADIOACTIVE SEED IMPLANT (N/A)  Patient Location: Patient transported to PACU with oxygen via face mask at 4 Liters / Min  Anesthesia Type: General  Level of Consciousness: awake and alert   Airway & Oxygen Therapy: Patient Spontanous Breathing and Patient connected to face mask oxygen  Post-op Assessment: Report given to PACU RN and Post -op Vital signs reviewed and stable  Post vital signs: Reviewed and stable  Dentition: Teeth and oropharynx remain in pre-op condition  Complications: No apparent anesthesia complications

## 2012-12-03 NOTE — Anesthesia Procedure Notes (Signed)
Procedure Name: LMA Insertion Date/Time: 12/03/2012 9:53 AM Performed by: Fran Lowes Pre-anesthesia Checklist: Patient identified, Emergency Drugs available, Suction available and Patient being monitored Patient Re-evaluated:Patient Re-evaluated prior to inductionOxygen Delivery Method: Circle System Utilized Preoxygenation: Pre-oxygenation with 100% oxygen Intubation Type: IV induction Ventilation: Mask ventilation without difficulty LMA: LMA inserted LMA Size: 4.0 Number of attempts: 1 Airway Equipment and Method: bite block Placement Confirmation: positive ETCO2 Tube secured with: Tape Dental Injury: Teeth and Oropharynx as per pre-operative assessment

## 2012-12-03 NOTE — Anesthesia Preprocedure Evaluation (Addendum)
Anesthesia Evaluation  Patient identified by MRN, date of birth, ID band Patient awake    Reviewed: Allergy & Precautions, H&P , NPO status , Patient's Chart, lab work & pertinent test results  Airway Mallampati: II TM Distance: >3 FB Neck ROM: Full    Dental  (+) Dental Advisory Given and Teeth Intact   Pulmonary neg pulmonary ROS,  breath sounds clear to auscultation        Cardiovascular hypertension, Pt. on medications Rhythm:Regular Rate:Normal  Echo 07/2008 -  Overall left ventricular systolic function was normal. Left ventricular ejection fraction was estimated to be 60 %. Left ventricular wall thickness was at the upper limits of normal.  -  The aortic valve was mildly calcified. There was mild aortic valvular regurgitation.  -  There was mild aortic root dilatation.  -  The left atrium was mildly dilated.    Neuro/Psych negative neurological ROS  negative psych ROS   GI/Hepatic Neg liver ROS, GERD-  Medicated,  Endo/Other  diabetes, Type 2, Oral Hypoglycemic Agents  Renal/GU negative Renal ROS     Musculoskeletal negative musculoskeletal ROS (+)   Abdominal   Peds  Hematology negative hematology ROS (+)   Anesthesia Other Findings   Reproductive/Obstetrics                         Anesthesia Physical Anesthesia Plan  ASA: III  Anesthesia Plan: General   Post-op Pain Management:    Induction: Intravenous  Airway Management Planned: LMA  Additional Equipment:   Intra-op Plan:   Post-operative Plan: Extubation in OR  Informed Consent: I have reviewed the patients History and Physical, chart, labs and discussed the procedure including the risks, benefits and alternatives for the proposed anesthesia with the patient or authorized representative who has indicated his/her understanding and acceptance.   Dental advisory given  Plan Discussed with: CRNA  Anesthesia Plan  Comments:         Anesthesia Quick Evaluation

## 2012-12-04 ENCOUNTER — Other Ambulatory Visit: Payer: Self-pay | Admitting: *Deleted

## 2012-12-04 MED ORDER — GLUCOSE BLOOD VI STRP: ORAL_STRIP | Status: DC

## 2012-12-07 NOTE — Procedures (Signed)
  Radiation Oncology         [phone]) [phone] ________________________________  Name: Calvin Burnett MRN: [id number]  Date: 10/15/2012  DOB: 1934-07-06       Prostate Seed Implant  WU:[id number],NWGN Ramon Dredge, MD  No ref. provider found  DIAGNOSIS: 79 y.o. gentleman with stage T1c adenocarcinoma of the prostate with a Gleason's score of 3+4 and a PSA of 5.65  PROCEDURE: Insertion of radioactive I-125 seeds into the prostate gland.  RADIATION DOSE: 145 Gy, definitive therapy.  TECHNIQUE: Calvin Burnett was brought to the operating room with the urologist. He was placed in the dorsolithotomy position. He was catheterized and a rectal tube was inserted. The perineum was shaved, prepped and draped. The ultrasound probe was then introduced into the rectum to see the prostate gland.  TREATMENT DEVICE: A needle grid was attached to the ultrasound probe stand and anchor needles were placed.  COMPLEX ISODOSE CALCULATION: The prostate was imaged in 3D using a sagittal sweep of the prostate probe. These images were transferred to the planning computer. There, the prostate, urethra and rectum were defined on each axial reconstructed image. Then, the software created an optimized plan and a few seed positions were adjusted. Then the accepted plan was uploaded to the seed Selectron afterloading unit.  SPECIAL TREATMENT PROCEDURE/SUPERVISION AND HANDLING: The Nucletron FIRST system was used to place the needles under sagittal guidance. A total of 22 needles were used to deposit 80 seeds in the prostate gland. The individual seed activity was 0.464 mCi for a total implant activity of 37.12 mCi.  COMPLEX SIMULATION: At the end of the procedure, an anterior radiograph of the pelvis was obtained to document seed positioning and count. Cystoscopy was performed to check the urethra and bladder.  MICRODOSIMETRY: At the end of the procedure, the patient was emitting 0.073 mrem/hr at 1 meter. Accordingly, he was considered  safe for hospital discharge.  PLAN: The patient will return to the radiation oncology clinic for post implant CT dosimetry in three weeks.   ________________________________  Artist Pais Kathrynn Running, M.D.

## 2012-12-08 ENCOUNTER — Encounter (HOSPITAL_BASED_OUTPATIENT_CLINIC_OR_DEPARTMENT_OTHER): Payer: Self-pay | Admitting: Urology

## 2012-12-08 ENCOUNTER — Other Ambulatory Visit: Payer: Self-pay | Admitting: Internal Medicine

## 2012-12-23 ENCOUNTER — Telehealth: Payer: Self-pay | Admitting: *Deleted

## 2012-12-23 NOTE — Telephone Encounter (Signed)
Called patient to remind of appts. For 12-24-12, lvm for a return call

## 2012-12-24 ENCOUNTER — Encounter: Payer: Self-pay | Admitting: Radiation Oncology

## 2012-12-24 ENCOUNTER — Ambulatory Visit
Admission: RE | Admit: 2012-12-24 | Discharge: 2012-12-24 | Disposition: A | Discharge status: Home / Self Care | Payer: Medicare Other | Source: Ambulatory Visit | Attending: Radiation Oncology | Admitting: Radiation Oncology

## 2012-12-24 ENCOUNTER — Other Ambulatory Visit: Payer: Self-pay | Admitting: Radiation Oncology

## 2012-12-24 VITALS — BP 123/85 | HR 62 | Temp 98.1°F | Resp 18 | Wt 181.0 lb

## 2012-12-24 DIAGNOSIS — C61 Malignant neoplasm of prostate: Secondary | ICD-10-CM

## 2012-12-24 DIAGNOSIS — C4491 Basal cell carcinoma of skin, unspecified: Secondary | ICD-10-CM

## 2012-12-24 NOTE — Progress Notes (Signed)
Radiation Oncology         [phone] ________________________________  Name: KAMRIN SIBLEY MRN: [id number]  Date: 12/24/2012  DOB: 17-Aug-1933  Follow-Up Visit Note  CC: Carrie Mew, MD  Marcine Matar, MD  Diagnosis:   79 y.o. gentleman with stage T1c adenocarcinoma of the prostate with a Gleason's score of 3+4 and a PSA of 5.65  Interval Since Last Radiation:  3  weeks  Narrative:  The patient returns today for routine follow-up.  He is complaining of increased urinary frequency and urinary hesitation symptoms. He filled out a questionnaire regarding urinary function today providing and overall IPSS score of 13 characterizing his symptoms as moderate.  His pre-implant score was zero.  He does have pain localized to the scrotum during urination. He denies any bowel symptoms.  ALLERGIES:  has No Known Allergies.  Meds: Current Outpatient Prescriptions  Medication Sig Dispense Refill  . amitriptyline (ELAVIL) 10 MG tablet Take 10 mg by mouth at bedtime.      Marland Kitchen aspirin 81 MG tablet Take 81 mg by mouth daily.      Marland Kitchen glipiZIDE (GLUCOTROL) 5 MG tablet Take 5 mg by mouth daily. Er 24hr      . glucose blood (BAYER CONTOUR NEXT TEST) test strip Use as instructed  100 each  3  . ramipril (ALTACE) 5 MG tablet Take 5 mg by mouth daily.      . ranitidine (ZANTAC) 150 MG capsule       . sertraline (ZOLOFT) 50 MG tablet TAKE 1 TABLET BY MOUTH EVERY DAY  30 tablet  0  . ciprofloxacin (CIPRO) 250 MG tablet Take 1 tablet (250 mg total) by mouth 2 (two) times daily.  10 tablet  0  . HYDROcodone-acetaminophen (NORCO) 5-325 MG per tablet Take 1-2 tablets by mouth every 4 (four) hours as needed for pain.  20 tablet  0   No current facility-administered medications for this encounter.    Physical Findings: The patient is in no acute distress. Patient is alert and oriented.  weight is 181 lb (82.101 kg). His oral temperature is 98.1 F (36.7 C). His blood pressure is 123/85 and his  pulse is 62. His respiration is 18. .  I examined the patient's perineum and scrotum today.  No ecchymosis or tenderness was noted.  The phallus was uncirc and WNL.  The testes were WNL and non-tender.  The epididymus on the left was slightly tender to palpation, but, normal size and symmetric.  No significant changes.  Lab Findings: Lab Results  Component Value Date   WBC 6.0 11/26/2012   HGB 13.9 12/03/2012   HCT 41.0 12/03/2012   MCV 90.5 11/26/2012   PLT 198 11/26/2012    Radiographic Findings:  Patient underwent CT imaging in our clinic for post implant dosimetry. The CT appears to demonstrate an adequate distribution of radioactive seeds throughout the prostate gland. There no seeds in her near the rectum. I suspect the final radiation plan and dosimetry will show appropriate coverage of the prostate gland.   Impression: The patient is recovering from the effects of radiation. His urinary symptoms should gradually improve over the next 4-6 months. We talked about this today. We talked about his scrotal pain today, and speculated that this is likely referred pain from inflammation in the prostate/urethra.  Plan: Today, I spent time talking to the patient about his prostate seed implant and resolving urinary symptoms. We also talked about long-term follow-up for prostate cancer following seed implant.  He understands that ongoing PSA determinations and digital rectal exams will help perform surveillance to rule out disease recurrence. He understands what to expect with his PSA measures. Patient was also educated today about some of the long-term effects would radiation including the Small risk for rectal bleeding and possibly erectile dysfunction. Talked about some of the general management approaches to these potential complications. However, I did encourage the patient to contact her office or return at any point if he has questions or concerns related to his previous radiation and prostate  cancer.  I encouraged the patient to try advil or aleve twice daily for 7 days for his discomfort.  _____________________________________  Artist Pais. Kathrynn Running, M.D.

## 2012-12-24 NOTE — Progress Notes (Signed)
Reports persistent pain in upper inner thighs and rectum. Denies taking medication for this pain. States, i don't take medication for the pain since it tolerable but, I am concerned." Reports pain associated with voiding but, denies that it is a burning pain. Patient is unable to score or describe said pain. Reports applying ice packs to genitals often to relieve this pain. Patient reports increased flatulence. Reports getting up on average twice during the night to void. Denies burning with urination. Reports occasionally he is unable to completely empty his bladder when going to the restroom. Denies edema of genitals. Denies incontinence. Reports a strong urine stream.  IPSS 13

## 2012-12-24 NOTE — Progress Notes (Signed)
  Radiation Oncology         (336) [phone] ________________________________  Name: Calvin Burnett MRN: [id number]  Date: 12/24/2012  DOB: 1934/05/04  COMPLEX SIMULATION NOTE  NARRATIVE:  The patient was brought to the CT Simulation planning suite today following prostate seed implantation approximately one month ago.  Identity was confirmed.  All relevant records and images related to the planned course of therapy were reviewed.  Then, the patient was set-up supine.  CT images were obtained.  The CT images were loaded into the planning software.  Then the prostate and rectum were contoured.  Treatment planning then occurred.  The implanted iodine 125 seeds were identified by the physics staff for projection of radiation distribution  I have requested : 3D Simulation  I have requested a DVH of the following structures: Prostate and rectum.    ________________________________  Artist Pais Kathrynn Running, M.D.

## 2013-01-12 ENCOUNTER — Encounter: Payer: Self-pay | Admitting: Radiation Oncology

## 2013-01-12 ENCOUNTER — Other Ambulatory Visit: Payer: Self-pay | Admitting: Internal Medicine

## 2013-01-12 ENCOUNTER — Ambulatory Visit
Admission: RE | Admit: 2013-01-12 | Discharge: 2013-01-12 | Disposition: A | Discharge status: Home / Self Care | Payer: Medicare Other | Source: Ambulatory Visit | Attending: Radiation Oncology | Admitting: Radiation Oncology

## 2013-01-12 DIAGNOSIS — C61 Malignant neoplasm of prostate: Secondary | ICD-10-CM | POA: Insufficient documentation

## 2013-01-27 NOTE — Progress Notes (Signed)
  Radiation Oncology         [phone] ________________________________  Name: KAVIAN PETERS MRN: [id number]  Date: 01/12/2013  DOB: 1933/12/29  3-D Planning Note Prostate Brachytherapy  Diagnosis:   79 y.o. gentleman with stage T1c adenocarcinoma of the prostate with a Gleason's score of 3+4 and a PSA of 5.65  Narrative: On a previous date, MARKEES CARNS returned following prostate seed implantation for post implant planning. He underwent CT scan complex simulation to delineate the three-dimensional structures of the pelvis and demonstrate the radiation distribution.  Since that time, the seed localization, and 3D planning with dose volume histograms have now been completed.  Results:   Prostate Coverage - The dose of radiation delivered to the 90% or more of the prostate gland (D90) was 106.94% of the prescription dose. This exceeds our goal of greater than 90%. Rectal Sparing - The volume of rectal tissue receiving the prescription dose or higher was 0.0 cc. This falls under our threshold tolerance of 1.0 cc.  Impression: The prostate seed implant appears to show adequate target coverage and appropriate rectal sparing.  Plan:  The patient will continue to follow with urology for ongoing PSA determinations. I would anticipate a high likelihood for local tumor control with minimal risk for rectal morbidity.  ________________________________  Artist Pais Kathrynn Running, M.D.

## 2013-02-26 ENCOUNTER — Encounter: Payer: Self-pay | Admitting: Internal Medicine

## 2013-02-26 ENCOUNTER — Other Ambulatory Visit: Payer: Self-pay | Admitting: *Deleted

## 2013-02-26 ENCOUNTER — Ambulatory Visit (INDEPENDENT_AMBULATORY_CARE_PROVIDER_SITE_OTHER): Payer: Medicare Other | Admitting: Internal Medicine

## 2013-02-26 VITALS — BP 140/84 | HR 76 | Temp 98.2°F | Resp 16 | Ht 70.0 in | Wt 184.0 lb

## 2013-02-26 DIAGNOSIS — IMO0002 Reserved for concepts with insufficient information to code with codable children: Secondary | ICD-10-CM

## 2013-02-26 DIAGNOSIS — I10 Essential (primary) hypertension: Secondary | ICD-10-CM

## 2013-02-26 DIAGNOSIS — E1165 Type 2 diabetes mellitus with hyperglycemia: Secondary | ICD-10-CM

## 2013-02-26 DIAGNOSIS — R413 Other amnesia: Secondary | ICD-10-CM

## 2013-02-26 LAB — HEMOGLOBIN A1C: Hgb A1c MFr Bld: 6.8 % — ABNORMAL HIGH (ref 4.6–6.5)

## 2013-02-26 LAB — BASIC METABOLIC PANEL
Calcium: 8.9 mg/dL (ref 8.4–10.5)
GFR: 62.96 mL/min (ref >60.00)
Sodium: 139 mEq/L (ref 135–145)

## 2013-02-26 MED ORDER — GLUCOSE BLOOD VI STRP: ORAL_STRIP | Status: DC

## 2013-02-26 NOTE — Progress Notes (Signed)
  Subjective:    Patient ID: Calvin Burnett, male    DOB: Nov 30, 1933, 79 y.o.   MRN: [id number]  HPI Cannot spell world back-words and has signs of increased short term memory loss that could be alzheimer's. Seeing the cardiologist next week Stable HTN DM moderately well controlled  With CBG's in the 130 range Last A1c was 6.8   Review of Systems  Constitutional: Negative for fever and fatigue.  HENT: Negative for hearing loss, congestion, neck pain and postnasal drip.   Eyes: Negative for discharge, redness and visual disturbance.  Respiratory: Negative for cough, shortness of breath and wheezing.   Cardiovascular: Negative for leg swelling.  Gastrointestinal: Negative for abdominal pain, constipation and abdominal distention.  Genitourinary: Negative for urgency and frequency.  Musculoskeletal: Negative for joint swelling and arthralgias.  Skin: Negative for color change and rash.  Neurological: Negative for weakness and light-headedness.  Hematological: Negative for adenopathy.  Psychiatric/Behavioral: Negative for behavioral problems.       Objective:   Physical Exam  Constitutional: He appears well-developed and well-nourished.  HENT:  Head: Normocephalic and atraumatic.  Eyes: Conjunctivae are normal. Pupils are equal, round, and reactive to light.  Neck: Normal range of motion. Neck supple.  Cardiovascular: Normal rate and regular rhythm.   Pulmonary/Chest: Effort normal and breath sounds normal.  Abdominal: Soft. Bowel sounds are normal.          Assessment & Plan:  Essentially stable diabetes with her last A1c being 6.8 we'll monitor today.  Stable diabetes in stable hypertension and will look for renal condition with a basic metabolic panel.  Status post skin cancer treatment  Memory loss with signs for dementia

## 2013-03-08 ENCOUNTER — Other Ambulatory Visit: Payer: Self-pay | Admitting: Internal Medicine

## 2013-03-21 ENCOUNTER — Other Ambulatory Visit: Payer: Self-pay | Admitting: Internal Medicine

## 2013-03-24 ENCOUNTER — Other Ambulatory Visit: Payer: Self-pay | Admitting: Internal Medicine

## 2013-04-16 ENCOUNTER — Ambulatory Visit (INDEPENDENT_AMBULATORY_CARE_PROVIDER_SITE_OTHER): Payer: Medicare Other

## 2013-04-16 DIAGNOSIS — Z23 Encounter for immunization: Secondary | ICD-10-CM

## 2013-04-25 ENCOUNTER — Other Ambulatory Visit: Payer: Self-pay | Admitting: Internal Medicine

## 2013-07-02 ENCOUNTER — Encounter: Payer: Self-pay | Admitting: Internal Medicine

## 2013-07-02 ENCOUNTER — Ambulatory Visit (INDEPENDENT_AMBULATORY_CARE_PROVIDER_SITE_OTHER): Payer: Medicare Other | Admitting: Internal Medicine

## 2013-07-02 VITALS — BP 140/90 | HR 72 | Temp 98.0°F | Resp 16 | Ht 70.0 in | Wt 184.0 lb

## 2013-07-02 DIAGNOSIS — E1165 Type 2 diabetes mellitus with hyperglycemia: Secondary | ICD-10-CM

## 2013-07-02 DIAGNOSIS — R413 Other amnesia: Secondary | ICD-10-CM

## 2013-07-02 DIAGNOSIS — I10 Essential (primary) hypertension: Secondary | ICD-10-CM

## 2013-07-02 LAB — BASIC METABOLIC PANEL
Chloride: 105 mEq/L (ref 96–112)
GFR: 61.71 mL/min (ref >60.00)
Potassium: 4.3 mEq/L (ref 3.5–5.1)

## 2013-07-02 LAB — HEMOGLOBIN A1C: Hgb A1c MFr Bld: 7.1 % — ABNORMAL HIGH (ref 4.6–6.5)

## 2013-07-02 MED ORDER — MEMANTINE HCL 10 MG PO TABS: 10.0000 mg | ORAL_TABLET | Freq: Two times a day (BID) | ORAL | Status: DC

## 2013-07-02 MED ORDER — RANITIDINE HCL 150 MG PO TABS: 150.0000 mg | ORAL_TABLET | Freq: Two times a day (BID) | ORAL | Status: DC

## 2013-07-02 NOTE — Patient Instructions (Signed)
Stop the amitriptyline 10 mg at bedtime.

## 2013-07-02 NOTE — Progress Notes (Signed)
Pre visit review using our clinic review tool, if applicable. No additional management support is needed unless otherwise documented below in the visit note. 
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
## 2013-08-01 ENCOUNTER — Other Ambulatory Visit: Payer: Self-pay | Admitting: Internal Medicine

## 2013-08-02 ENCOUNTER — Other Ambulatory Visit: Payer: Self-pay | Admitting: *Deleted

## 2013-08-02 MED ORDER — RANITIDINE HCL 150 MG PO TABS: 150.0000 mg | ORAL_TABLET | Freq: Two times a day (BID) | ORAL | Status: DC

## 2013-09-27 ENCOUNTER — Telehealth: Payer: Self-pay | Admitting: Internal Medicine

## 2013-09-27 MED ORDER — AMITRIPTYLINE HCL 10 MG PO TABS: 10.0000 mg | ORAL_TABLET | Freq: Every day | ORAL | Status: DC

## 2013-09-27 NOTE — Telephone Encounter (Signed)
Pt notified Rx sent to pharmacy

## 2013-09-27 NOTE — Telephone Encounter (Addendum)
Pt needs new rx amitriptyline (ELAVIL) 10 MG tablet  [facility] / [address]

## 2013-10-18 ENCOUNTER — Telehealth: Payer: Self-pay | Admitting: Internal Medicine

## 2013-10-18 MED ORDER — MEMANTINE HCL ER 28 MG PO CP24: 1.0000 | ORAL_CAPSULE | Freq: Every day | ORAL | Status: DC

## 2013-10-18 NOTE — Telephone Encounter (Signed)
Directions please 

## 2013-10-18 NOTE — Telephone Encounter (Signed)
Pt request refill of memantine (NAMENDA) 10 MG tablet Pt would like to get in liquid form. Pt states the liquid is a lot cheaper.  [facility]/ [address]   30 day refill

## 2013-10-18 NOTE — Telephone Encounter (Signed)
Rx sent to pharmacy and patient is aware

## 2013-10-18 NOTE — Telephone Encounter (Signed)
Change to the 28 mg timed release  One a day

## 2013-12-16 ENCOUNTER — Encounter: Payer: Self-pay | Admitting: Family

## 2013-12-16 ENCOUNTER — Ambulatory Visit (INDEPENDENT_AMBULATORY_CARE_PROVIDER_SITE_OTHER): Payer: Medicare Other | Admitting: Family

## 2013-12-16 VITALS — BP 130/70 | HR 84 | Temp 98.4°F | Ht 70.0 in | Wt 187.0 lb

## 2013-12-16 DIAGNOSIS — M542 Cervicalgia: Secondary | ICD-10-CM

## 2013-12-16 DIAGNOSIS — R300 Dysuria: Secondary | ICD-10-CM

## 2013-12-16 DIAGNOSIS — E7800 Pure hypercholesterolemia, unspecified: Secondary | ICD-10-CM

## 2013-12-16 DIAGNOSIS — K219 Gastro-esophageal reflux disease without esophagitis: Secondary | ICD-10-CM

## 2013-12-16 DIAGNOSIS — F329 Major depressive disorder, single episode, unspecified: Secondary | ICD-10-CM

## 2013-12-16 DIAGNOSIS — IMO0001 Reserved for inherently not codable concepts without codable children: Secondary | ICD-10-CM

## 2013-12-16 DIAGNOSIS — E1165 Type 2 diabetes mellitus with hyperglycemia: Principal | ICD-10-CM

## 2013-12-16 LAB — POCT URINALYSIS DIPSTICK
BILIRUBIN UA: NEGATIVE
Glucose, UA: NEGATIVE
KETONES UA: NEGATIVE
LEUKOCYTES UA: NEGATIVE
Nitrite, UA: NEGATIVE
PH UA: 6
Urobilinogen, UA: 0.2

## 2013-12-16 LAB — HEPATIC FUNCTION PANEL
ALK PHOS: 91 U/L (ref 39–117)
ALT: 23 U/L (ref 0–53)
AST: 25 U/L (ref 0–37)
Albumin: 4.1 g/dL (ref 3.5–5.2)
BILIRUBIN DIRECT: 0.1 mg/dL (ref 0.0–0.3)
TOTAL PROTEIN: 7.3 g/dL (ref 6.0–8.3)
Total Bilirubin: 0.7 mg/dL (ref 0.2–1.2)

## 2013-12-16 LAB — LIPID PANEL
CHOL/HDL RATIO: 4
Cholesterol: 189 mg/dL (ref 0–200)
HDL: 44 mg/dL (ref >39.00)
LDL Cholesterol: 110 mg/dL — ABNORMAL HIGH (ref 0–99)
NONHDL: 145
TRIGLYCERIDES: 177 mg/dL — AB (ref 0.0–149.0)
VLDL: 35.4 mg/dL (ref 0.0–40.0)

## 2013-12-16 LAB — BASIC METABOLIC PANEL
BUN: 14 mg/dL (ref 6–23)
CHLORIDE: 101 meq/L (ref 96–112)
CO2: 30 meq/L (ref 19–32)
CREATININE: 1.3 mg/dL (ref 0.4–1.5)
Calcium: 9.6 mg/dL (ref 8.4–10.5)
GFR: 56.74 mL/min — ABNORMAL LOW (ref >60.00)
GLUCOSE: 109 mg/dL — AB (ref 70–99)
Potassium: 4.4 mEq/L (ref 3.5–5.1)
Sodium: 137 mEq/L (ref 135–145)

## 2013-12-16 LAB — HEMOGLOBIN A1C: Hgb A1c MFr Bld: 6.9 % — ABNORMAL HIGH (ref 4.6–6.5)

## 2013-12-16 NOTE — Patient Instructions (Signed)
Diabetes and Exercise Exercising regularly is important. It is not just about losing weight. It has many health benefits, such as:  Improving your overall fitness, flexibility, and endurance.  Increasing your bone density.  Helping with weight control.  Decreasing your body fat.  Increasing your muscle strength.  Reducing stress and tension.  Improving your overall health. People with diabetes who exercise gain additional benefits because exercise:  Reduces appetite.  Improves the body's use of blood sugar (glucose).  Helps lower or control blood glucose.  Decreases blood pressure.  Helps control blood lipids (such as cholesterol and triglycerides).  Improves the body's use of the hormone insulin by:  Increasing the body's insulin sensitivity.  Reducing the body's insulin needs.  Decreases the risk for heart disease because exercising:  Lowers cholesterol and triglycerides levels.  Increases the levels of good cholesterol (such as high-density lipoproteins [HDL]) in the body.  Lowers blood glucose levels. YOUR ACTIVITY PLAN  Choose an activity that you enjoy and set realistic goals. Your health care provider or diabetes educator can help you make an activity plan that works for you. You can break activities into 2 or 3 sessions throughout the day. Doing so is as good as one long session. Exercise ideas include:  Taking the dog for a walk.  Taking the stairs instead of the elevator.  Dancing to your favorite song.  Doing your favorite exercise with a friend. RECOMMENDATIONS FOR EXERCISING WITH TYPE 1 OR TYPE 2 DIABETES   Check your blood glucose before exercising. If blood glucose levels are greater than 240 mg/dL, check for urine ketones. Do not exercise if ketones are present.  Avoid injecting insulin into areas of the body that are going to be exercised. For example, avoid injecting insulin into:  The arms when playing tennis.  The legs when  jogging.  Keep a record of:  Food intake before and after you exercise.  Expected peak times of insulin action.  Blood glucose levels before and after you exercise.  The type and amount of exercise you have done.  Review your records with your health care provider. Your health care provider will help you to develop guidelines for adjusting food intake and insulin amounts before and after exercising.  If you take insulin or oral hypoglycemic agents, watch for signs and symptoms of hypoglycemia. They include:  Dizziness.  Shaking.  Sweating.  Chills.  Confusion.  Drink plenty of water while you exercise to prevent dehydration or heat stroke. Body water is lost during exercise and must be replaced.  Talk to your health care provider before starting an exercise program to make sure it is safe for you. Remember, almost any type of activity is better than none. Document Released: 09/21/2003 Document Revised: 03/03/2013 Document Reviewed: 12/08/2012 [facility] Patient Information 2014 [place].   Torticollis, Acute You have suddenly (acutely) developed a twisted neck (torticollis). This is usually a self-limited condition. CAUSES  Acute torticollis may be caused by malposition, trauma or infection. Most commonly, acute torticollis is caused by sleeping in an awkward position. Torticollis may also be caused by the flexion, extension or twisting of the neck muscles beyond their normal position. Sometimes, the exact cause may not be known. SYMPTOMS  Usually, there is pain and limited movement of the neck. Your neck may twist to one side. DIAGNOSIS  The diagnosis is often made by physical examination. X-rays, CT scans or MRIs may be done if there is a history of trauma or concern of infection. TREATMENT  For a common, stiff neck that develops during sleep, treatment is focused on relaxing the contracted neck muscle. Medications (including shots) may be used to treat the problem.  Most cases resolve in several days. Torticollis usually responds to conservative physical therapy. If left untreated, the shortened and spastic neck muscle can cause deformities in the face and neck. Rarely, surgery is required. HOME CARE INSTRUCTIONS   Use over-the-counter and prescription medications as directed by your caregiver.  Do stretching exercises and massage the neck as directed by your caregiver.  Follow up with physical therapy if needed and as directed by your caregiver. SEEK IMMEDIATE MEDICAL CARE IF:   You develop difficulty breathing or noisy breathing (stridor).  You drool, develop trouble swallowing or have pain with swallowing.  You develop numbness or weakness in the hands or feet.  You have changes in speech or vision.  You have problems with urination or bowel movements.  You have difficulty walking.  You have a fever.  You have increased pain. MAKE SURE YOU:   Understand these instructions.  Will watch your condition.  Will get help right away if you are not doing well or get worse. Document Released: 06/28/2000 Document Revised: 09/23/2011 Document Reviewed: 08/09/2009 [facility] Patient Information 2014 [place], [place].

## 2013-12-16 NOTE — Progress Notes (Signed)
Pre visit review using our clinic review tool, if applicable. No additional management support is needed unless otherwise documented below in the visit note. 

## 2013-12-17 ENCOUNTER — Ambulatory Visit: Payer: Medicare Other | Admitting: Internal Medicine
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
## 2014-01-08 ENCOUNTER — Other Ambulatory Visit: Payer: Self-pay | Admitting: Internal Medicine

## 2014-02-11 ENCOUNTER — Other Ambulatory Visit: Payer: Self-pay | Admitting: Internal Medicine

## 2014-03-01 ENCOUNTER — Ambulatory Visit (INDEPENDENT_AMBULATORY_CARE_PROVIDER_SITE_OTHER): Payer: Medicare Other | Admitting: Family Medicine

## 2014-03-01 ENCOUNTER — Encounter: Payer: Self-pay | Admitting: Family Medicine

## 2014-03-01 VITALS — BP 113/80 | HR 64 | Temp 98.2°F | Ht 68.0 in | Wt 184.0 lb

## 2014-03-01 DIAGNOSIS — IMO0002 Reserved for concepts with insufficient information to code with codable children: Secondary | ICD-10-CM

## 2014-03-01 DIAGNOSIS — E1165 Type 2 diabetes mellitus with hyperglycemia: Secondary | ICD-10-CM

## 2014-03-01 DIAGNOSIS — E785 Hyperlipidemia, unspecified: Secondary | ICD-10-CM

## 2014-03-01 DIAGNOSIS — Z23 Encounter for immunization: Secondary | ICD-10-CM

## 2014-03-01 DIAGNOSIS — I10 Essential (primary) hypertension: Secondary | ICD-10-CM

## 2014-03-01 DIAGNOSIS — E1169 Type 2 diabetes mellitus with other specified complication: Secondary | ICD-10-CM | POA: Insufficient documentation

## 2014-03-01 MED ORDER — GLUCOSE BLOOD VI STRP: ORAL_STRIP | Status: DC

## 2014-03-01 MED ORDER — ONETOUCH LANCETS MISC: 1.0000 | Freq: Every day | Status: DC

## 2014-03-01 NOTE — Assessment & Plan Note (Signed)
A1c controlled 2 months ago with 6.9 #. F/u 3 months. Continue glipizide. Updated health maintenance per AVS.

## 2014-03-01 NOTE — Patient Instructions (Signed)
Wonderful to meet you.  Let's check in 3 months from now and we will do a memory test.  Great job on the diabetes.   Health Maintenance Due  Topic Date Due  . Colonoscopy - discuss next year 11/28/1985  . Zostavax -ask insurance 11/29/1995  . Pneumococcal Polysaccharide -Prevnar today and likely pneumovax in 6-12 months  11/28/2000  . Ophthalmology Exam -I will refer you 05/09/2011  . Influenza Vaccine - get it when available 02/12/2014
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
## 2014-03-01 NOTE — Assessment & Plan Note (Signed)
Check MMSE next visit. Unclear if had reversible causes of dementia evaluation.

## 2014-03-01 NOTE — Assessment & Plan Note (Signed)
Well controlled on ramipril-continue

## 2014-03-01 NOTE — Assessment & Plan Note (Signed)
Discussed benefits/risks of therapy with LDL 110 and diabetes. As no history heart attack or CVA and over age 80, patient opted to continue off of medicine with understanding he would need this if he ever had one of these events.

## 2014-03-28 ENCOUNTER — Other Ambulatory Visit: Payer: Self-pay | Admitting: Internal Medicine

## 2014-03-29 ENCOUNTER — Encounter: Payer: Self-pay | Admitting: Family Medicine

## 2014-05-10 ENCOUNTER — Encounter: Payer: Self-pay | Admitting: Family Medicine

## 2014-05-10 ENCOUNTER — Ambulatory Visit (INDEPENDENT_AMBULATORY_CARE_PROVIDER_SITE_OTHER): Payer: Medicare Other | Admitting: Family Medicine

## 2014-05-10 VITALS — BP 148/77 | HR 59 | Temp 98.6°F | Ht 68.0 in | Wt 183.0 lb

## 2014-05-10 DIAGNOSIS — R319 Hematuria, unspecified: Secondary | ICD-10-CM

## 2014-05-10 LAB — POCT URINALYSIS DIPSTICK
Bilirubin, UA: NEGATIVE
Glucose, UA: NEGATIVE
Ketones, UA: NEGATIVE
LEUKOCYTES UA: NEGATIVE
NITRITE UA: NEGATIVE
PH UA: 6
Spec Grav, UA: >=1.03
Urobilinogen, UA: 0.2

## 2014-05-10 NOTE — Progress Notes (Signed)
Pre visit review using our clinic review tool, if applicable. No additional management support is needed unless otherwise documented below in the visit note. 

## 2014-05-10 NOTE — Addendum Note (Signed)
Addended by: Aggie Hacker A on: 05/10/2014 01:00 PM   Modules accepted: Orders

## 2014-05-10 NOTE — Progress Notes (Signed)
   Subjective:    Patient ID: Calvin Burnett, male    DOB: 10/02/1933, 80 y.o.   MRN: [id number]  HPI Here for the onset last night of bright red blood in the urine. He has prostate cancer and Dr. Diona Fanti placed radioactive seeds in the prostate about a year ago. He had some blood in the urine 2 months ago as well as some perineal discomfort but when he saw Dr. Diona Fanti his urine was clear that day. He was told to simply monitor the situation. He had been doing fairly well until last night. This am he has continued to have a lot of blood in the urine, but no urinary urgency or burning.    Review of Systems  Constitutional: Negative.   Gastrointestinal: Negative.   Genitourinary: Positive for hematuria. Negative for dysuria, urgency, frequency and flank pain.       Objective:   Physical Exam  Constitutional: He appears well-developed and well-nourished. No distress.  Abdominal: Soft. Bowel sounds are normal. He exhibits no distension and no mass. There is no tenderness. There is no rebound and no guarding.          Assessment & Plan:  The hematuria is likely the result of tissue irritation from the brachytherapy. We will culture the urine today to rule out infection. Refer back to Dr. Diona Fanti to evaluate.

## 2014-05-12 LAB — URINE CULTURE
Colony Count: NO GROWTH
ORGANISM ID, BACTERIA: NO GROWTH

## 2014-06-01 ENCOUNTER — Ambulatory Visit (INDEPENDENT_AMBULATORY_CARE_PROVIDER_SITE_OTHER): Payer: Medicare Other | Admitting: Family Medicine

## 2014-06-01 ENCOUNTER — Encounter: Payer: Self-pay | Admitting: Family Medicine

## 2014-06-01 ENCOUNTER — Ambulatory Visit (INDEPENDENT_AMBULATORY_CARE_PROVIDER_SITE_OTHER): Payer: Medicare Other

## 2014-06-01 VITALS — BP 140/70 | HR 56 | Temp 97.7°F | Wt 184.0 lb

## 2014-06-01 DIAGNOSIS — N183 Chronic kidney disease, stage 3 unspecified: Secondary | ICD-10-CM | POA: Insufficient documentation

## 2014-06-01 DIAGNOSIS — IMO0002 Reserved for concepts with insufficient information to code with codable children: Secondary | ICD-10-CM

## 2014-06-01 DIAGNOSIS — I159 Secondary hypertension, unspecified: Secondary | ICD-10-CM

## 2014-06-01 DIAGNOSIS — E119 Type 2 diabetes mellitus without complications: Secondary | ICD-10-CM | POA: Diagnosis not present

## 2014-06-01 DIAGNOSIS — R413 Other amnesia: Secondary | ICD-10-CM | POA: Diagnosis not present

## 2014-06-01 DIAGNOSIS — Z23 Encounter for immunization: Secondary | ICD-10-CM

## 2014-06-01 DIAGNOSIS — E1165 Type 2 diabetes mellitus with hyperglycemia: Secondary | ICD-10-CM | POA: Diagnosis not present

## 2014-06-01 DIAGNOSIS — I10 Essential (primary) hypertension: Secondary | ICD-10-CM

## 2014-06-01 DIAGNOSIS — N1831 Chronic kidney disease, stage 3a: Secondary | ICD-10-CM | POA: Insufficient documentation

## 2014-06-01 LAB — COMPREHENSIVE METABOLIC PANEL
ALBUMIN: 4.2 g/dL (ref 3.5–5.2)
ALK PHOS: 91 U/L (ref 39–117)
ALT: 14 U/L (ref 0–53)
AST: 21 U/L (ref 0–37)
BUN: 19 mg/dL (ref 6–23)
CO2: 28 mEq/L (ref 19–32)
Calcium: 9.1 mg/dL (ref 8.4–10.5)
Chloride: 106 mEq/L (ref 96–112)
Creatinine, Ser: 1.3 mg/dL (ref 0.4–1.5)
GFR: 55.68 mL/min — ABNORMAL LOW (ref >60.00)
Glucose, Bld: 79 mg/dL (ref 70–99)
POTASSIUM: 4.2 meq/L (ref 3.5–5.1)
SODIUM: 142 meq/L (ref 135–145)
TOTAL PROTEIN: 7.5 g/dL (ref 6.0–8.3)
Total Bilirubin: 0.6 mg/dL (ref 0.2–1.2)

## 2014-06-01 LAB — CBC
HCT: 44.3 % (ref 39.0–52.0)
Hemoglobin: 14.5 g/dL (ref 13.0–17.0)
MCHC: 32.7 g/dL (ref 30.0–36.0)
MCV: 96.2 fl (ref 78.0–100.0)
Platelets: 228 10*3/uL (ref 150.0–400.0)
RBC: 4.61 Mil/uL (ref 4.22–5.81)
RDW: 13.5 % (ref 11.5–15.5)
WBC: 6.3 10*3/uL (ref 4.0–10.5)

## 2014-06-01 LAB — HEMOGLOBIN A1C: Hgb A1c MFr Bld: 6.7 % — ABNORMAL HIGH (ref 4.6–6.5)

## 2014-06-01 NOTE — Assessment & Plan Note (Signed)
Updated a1c at 6.7 today. Good control on glipizide alone. My only concern with this is his "memory loss" or "dementia"both of which are unclear diagnosis. He has MMSE 27/30 today and I would want to watch closely to make sure he can manage hypoglycemia. He displays appropriate knowledge at this time so we will continue. i cannot quite tell why he was not on metformin instead.

## 2014-06-01 NOTE — Assessment & Plan Note (Signed)
Stable on ramimpril 5mg  with GFR around 55. Recheck at least every 6 months.

## 2014-06-01 NOTE — Assessment & Plan Note (Signed)
Mild poor control. Patient is trying to help wife through an illness and I wonder if this is contributing as previously well controlled. We will simply plan to monitor for now and be more aggressive if need be at next visit. With CKD, would like to keep below 140 sbp.

## 2014-06-01 NOTE — Patient Instructions (Addendum)
Keba a1c today-DM CBC, CMET-HTN One touch  Meter Please look into last colonoscopy Postpone pneumovax 1 year as well as shingles  Patient  Follow up in 3 months, no changes otherwise  Health Maintenance Due  Topic Date Due  . COLONOSCOPY - we will look into this 11/28/1985  . OPHTHALMOLOGY EXAM -request records at front desk ROI 05/09/2011

## 2014-06-01 NOTE — Assessment & Plan Note (Signed)
MMSE 27/30. I discussed with patient that I am not sure of benefit of namenda in a case that is not likely dementia (per patient he has been stable at current level of forgetfulness for years). i do not think this is a falsely low reading either as HS education only. Continue namenda for now. Trend MMSE at least yearly.
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
## 2014-06-22 ENCOUNTER — Other Ambulatory Visit: Payer: Self-pay | Admitting: Family Medicine

## 2014-07-11 ENCOUNTER — Other Ambulatory Visit: Payer: Self-pay | Admitting: *Deleted

## 2014-07-11 MED ORDER — MEMANTINE HCL ER 28 MG PO CP24: 1.0000 | ORAL_CAPSULE | Freq: Every day | ORAL | Status: DC

## 2014-07-13 ENCOUNTER — Other Ambulatory Visit: Payer: Self-pay | Admitting: Family Medicine

## 2014-07-13 MED ORDER — SERTRALINE HCL 50 MG PO TABS: 50.0000 mg | ORAL_TABLET | Freq: Every day | ORAL | Status: DC

## 2014-07-13 NOTE — Telephone Encounter (Signed)
[id number] - [place], [place] [address] [phone] is requesting re-fill on sertraline (ZOLOFT) 50 MG tablet

## 2014-07-13 NOTE — Telephone Encounter (Signed)
rx sent in electronically 

## 2014-08-02 ENCOUNTER — Other Ambulatory Visit: Payer: Self-pay

## 2014-08-02 MED ORDER — GLIPIZIDE ER 5 MG PO TB24: 5.0000 mg | ORAL_TABLET | Freq: Every day | ORAL | Status: DC

## 2014-08-02 NOTE — Telephone Encounter (Signed)
Rx request glipizide er 5 mg tablet- TAke 1 tablet by mouth once daily # 30  Pharm:  [facility]  Rx sent to pharmacy.

## 2014-08-09 ENCOUNTER — Other Ambulatory Visit: Payer: Self-pay

## 2014-08-09 MED ORDER — RANITIDINE HCL 150 MG PO TABS: 150.0000 mg | ORAL_TABLET | Freq: Two times a day (BID) | ORAL | Status: DC

## 2014-08-09 NOTE — Telephone Encounter (Signed)
Rx request for Ranitidine 150 mg- Take 1 tablet by mouth twice daily #60  Pharm: [facility].   Rx sent to pharmacy.

## 2014-09-08 ENCOUNTER — Other Ambulatory Visit: Payer: Self-pay | Admitting: Family Medicine

## 2014-09-27 ENCOUNTER — Encounter (HOSPITAL_COMMUNITY): Payer: Self-pay | Admitting: Emergency Medicine

## 2014-09-27 ENCOUNTER — Inpatient Hospital Stay (HOSPITAL_COMMUNITY): Payer: PPO

## 2014-09-27 ENCOUNTER — Inpatient Hospital Stay (HOSPITAL_COMMUNITY)
Admission: EM | Admit: 2014-09-27 | Discharge: 2014-09-30 | DRG: 312 | Disposition: A | Discharge status: Home / Self Care | Payer: PPO | Attending: Internal Medicine | Admitting: Internal Medicine

## 2014-09-27 ENCOUNTER — Encounter: Payer: Self-pay | Admitting: Family Medicine

## 2014-09-27 ENCOUNTER — Emergency Department (HOSPITAL_COMMUNITY): Payer: PPO

## 2014-09-27 DIAGNOSIS — N183 Chronic kidney disease, stage 3 unspecified: Secondary | ICD-10-CM | POA: Diagnosis present

## 2014-09-27 DIAGNOSIS — R109 Unspecified abdominal pain: Secondary | ICD-10-CM

## 2014-09-27 DIAGNOSIS — Z6824 Body mass index (BMI) 24.0-24.9, adult: Secondary | ICD-10-CM

## 2014-09-27 DIAGNOSIS — Z9841 Cataract extraction status, right eye: Secondary | ICD-10-CM | POA: Diagnosis not present

## 2014-09-27 DIAGNOSIS — I129 Hypertensive chronic kidney disease with stage 1 through stage 4 chronic kidney disease, or unspecified chronic kidney disease: Secondary | ICD-10-CM | POA: Diagnosis present

## 2014-09-27 DIAGNOSIS — E440 Moderate protein-calorie malnutrition: Secondary | ICD-10-CM | POA: Diagnosis present

## 2014-09-27 DIAGNOSIS — Z961 Presence of intraocular lens: Secondary | ICD-10-CM | POA: Diagnosis present

## 2014-09-27 DIAGNOSIS — Z923 Personal history of irradiation: Secondary | ICD-10-CM

## 2014-09-27 DIAGNOSIS — Z79899 Other long term (current) drug therapy: Secondary | ICD-10-CM | POA: Diagnosis not present

## 2014-09-27 DIAGNOSIS — R001 Bradycardia, unspecified: Secondary | ICD-10-CM | POA: Diagnosis not present

## 2014-09-27 DIAGNOSIS — E1165 Type 2 diabetes mellitus with hyperglycemia: Secondary | ICD-10-CM | POA: Diagnosis present

## 2014-09-27 DIAGNOSIS — E860 Dehydration: Secondary | ICD-10-CM | POA: Diagnosis present

## 2014-09-27 DIAGNOSIS — Z7982 Long term (current) use of aspirin: Secondary | ICD-10-CM

## 2014-09-27 DIAGNOSIS — Z85828 Personal history of other malignant neoplasm of skin: Secondary | ICD-10-CM

## 2014-09-27 DIAGNOSIS — E119 Type 2 diabetes mellitus without complications: Secondary | ICD-10-CM

## 2014-09-27 DIAGNOSIS — E1169 Type 2 diabetes mellitus with other specified complication: Secondary | ICD-10-CM | POA: Diagnosis present

## 2014-09-27 DIAGNOSIS — N189 Chronic kidney disease, unspecified: Secondary | ICD-10-CM | POA: Diagnosis not present

## 2014-09-27 DIAGNOSIS — R55 Syncope and collapse: Secondary | ICD-10-CM | POA: Diagnosis present

## 2014-09-27 DIAGNOSIS — R197 Diarrhea, unspecified: Secondary | ICD-10-CM | POA: Diagnosis not present

## 2014-09-27 DIAGNOSIS — Z8546 Personal history of malignant neoplasm of prostate: Secondary | ICD-10-CM

## 2014-09-27 DIAGNOSIS — Z9842 Cataract extraction status, left eye: Secondary | ICD-10-CM

## 2014-09-27 DIAGNOSIS — E785 Hyperlipidemia, unspecified: Secondary | ICD-10-CM | POA: Diagnosis present

## 2014-09-27 DIAGNOSIS — I10 Essential (primary) hypertension: Secondary | ICD-10-CM | POA: Diagnosis present

## 2014-09-27 DIAGNOSIS — N179 Acute kidney failure, unspecified: Secondary | ICD-10-CM | POA: Diagnosis present

## 2014-09-27 DIAGNOSIS — Z87891 Personal history of nicotine dependence: Secondary | ICD-10-CM

## 2014-09-27 DIAGNOSIS — K219 Gastro-esophageal reflux disease without esophagitis: Secondary | ICD-10-CM | POA: Diagnosis present

## 2014-09-27 DIAGNOSIS — N1831 Chronic kidney disease, stage 3a: Secondary | ICD-10-CM | POA: Diagnosis present

## 2014-09-27 DIAGNOSIS — D72829 Elevated white blood cell count, unspecified: Secondary | ICD-10-CM | POA: Diagnosis present

## 2014-09-27 DIAGNOSIS — C61 Malignant neoplasm of prostate: Secondary | ICD-10-CM | POA: Diagnosis present

## 2014-09-27 LAB — URINALYSIS W MICROSCOPIC (NOT AT ARMC)
Bilirubin Urine: NEGATIVE
GLUCOSE, UA: NEGATIVE mg/dL
Ketones, ur: NEGATIVE mg/dL
Leukocytes, UA: NEGATIVE
Nitrite: NEGATIVE
PROTEIN: NEGATIVE mg/dL
Specific Gravity, Urine: 1.008 (ref 1.005–1.030)
Urobilinogen, UA: 1 mg/dL (ref 0.0–1.0)
pH: 6 (ref 5.0–8.0)

## 2014-09-27 LAB — CBC WITH DIFFERENTIAL/PLATELET
Basophils Absolute: 0 10*3/uL (ref 0.0–0.1)
Basophils Relative: 0 % (ref 0–1)
Eosinophils Absolute: 0.1 10*3/uL (ref 0.0–0.7)
Eosinophils Relative: 1 % (ref 0–5)
HEMATOCRIT: 39.4 % (ref 39.0–52.0)
Hemoglobin: 13.6 g/dL (ref 13.0–17.0)
LYMPHS ABS: 0.8 10*3/uL (ref 0.7–4.0)
LYMPHS PCT: 6 % — AB (ref 12–46)
MCH: 31.9 pg (ref 26.0–34.0)
MCHC: 34.5 g/dL (ref 30.0–36.0)
MCV: 92.3 fL (ref 78.0–100.0)
Monocytes Absolute: 0.7 10*3/uL (ref 0.1–1.0)
Monocytes Relative: 5 % (ref 3–12)
NEUTROS ABS: 11.6 10*3/uL — AB (ref 1.7–7.7)
NEUTROS PCT: 88 % — AB (ref 43–77)
PLATELETS: 144 10*3/uL — AB (ref 150–400)
RBC: 4.27 MIL/uL (ref 4.22–5.81)
RDW: 13.5 % (ref 11.5–15.5)
WBC: 13.3 10*3/uL — ABNORMAL HIGH (ref 4.0–10.5)

## 2014-09-27 LAB — COMPREHENSIVE METABOLIC PANEL
ALBUMIN: 3.4 g/dL — AB (ref 3.5–5.2)
ALK PHOS: 84 U/L (ref 39–117)
ALT: 13 U/L (ref 0–53)
AST: 19 U/L (ref 0–37)
Anion gap: 7 (ref 5–15)
BILIRUBIN TOTAL: 0.8 mg/dL (ref 0.3–1.2)
BUN: 17 mg/dL (ref 6–23)
CHLORIDE: 110 mmol/L (ref 96–112)
CO2: 23 mmol/L (ref 19–32)
CREATININE: 1.76 mg/dL — AB (ref 0.50–1.35)
Calcium: 8.3 mg/dL — ABNORMAL LOW (ref 8.4–10.5)
GFR calc Af Amer: 41 mL/min — ABNORMAL LOW (ref >90)
GFR, EST NON AFRICAN AMERICAN: 35 mL/min — AB (ref >90)
Glucose, Bld: 188 mg/dL — ABNORMAL HIGH (ref 70–99)
Potassium: 4.1 mmol/L (ref 3.5–5.1)
SODIUM: 140 mmol/L (ref 135–145)
Total Protein: 6 g/dL (ref 6.0–8.3)

## 2014-09-27 LAB — I-STAT TROPONIN, ED: Troponin i, poc: 0 ng/mL (ref 0.00–0.08)

## 2014-09-27 LAB — GLUCOSE, CAPILLARY: GLUCOSE-CAPILLARY: 88 mg/dL (ref 70–99)

## 2014-09-27 LAB — PSA: PSA, FREE: 0.03

## 2014-09-27 MED ORDER — SODIUM CHLORIDE 0.9 % IV SOLN
INTRAVENOUS | Status: DC
  Administered 2014-09-28: 01:00:00 via INTRAVENOUS

## 2014-09-27 MED ORDER — AMITRIPTYLINE HCL 10 MG PO TABS
10.0000 mg | ORAL_TABLET | Freq: Every day | ORAL | Status: DC
  Administered 2014-09-28 – 2014-09-29 (×2): 10 mg via ORAL
  Filled 2014-09-27 (×4): qty 1

## 2014-09-27 MED ORDER — GLIPIZIDE ER 5 MG PO TB24
5.0000 mg | ORAL_TABLET | Freq: Every day | ORAL | Status: DC
  Administered 2014-09-28 – 2014-09-30 (×3): 5 mg via ORAL
  Filled 2014-09-27 (×4): qty 1

## 2014-09-27 MED ORDER — INSULIN ASPART 100 UNIT/ML ~~LOC~~ SOLN
0.0000 [IU] | Freq: Three times a day (TID) | SUBCUTANEOUS | Status: DC
  Administered 2014-09-29: 1 [IU] via SUBCUTANEOUS

## 2014-09-27 MED ORDER — ONDANSETRON HCL 4 MG PO TABS: 4.0000 mg | ORAL_TABLET | Freq: Four times a day (QID) | ORAL | Status: DC | PRN

## 2014-09-27 MED ORDER — ACETAMINOPHEN 325 MG PO TABS
650.0000 mg | ORAL_TABLET | Freq: Four times a day (QID) | ORAL | Status: DC | PRN
  Administered 2014-09-28 – 2014-09-29 (×2): 650 mg via ORAL
  Filled 2014-09-27 (×2): qty 2

## 2014-09-27 MED ORDER — SODIUM CHLORIDE 0.9 % IV SOLN: INTRAVENOUS | Status: DC

## 2014-09-27 MED ORDER — ONDANSETRON HCL 4 MG/2ML IJ SOLN: 4.0000 mg | Freq: Four times a day (QID) | INTRAMUSCULAR | Status: DC | PRN

## 2014-09-27 MED ORDER — SODIUM CHLORIDE 0.9 % IV BOLUS (SEPSIS)
500.0000 mL | Freq: Once | INTRAVENOUS | Status: AC
Start: 2014-09-27 — End: 2014-09-27
  Administered 2014-09-27: 500 mL via INTRAVENOUS

## 2014-09-27 MED ORDER — SODIUM CHLORIDE 0.9 % IV BOLUS (SEPSIS)
500.0000 mL | Freq: Once | INTRAVENOUS | Status: AC
  Administered 2014-09-27: 500 mL via INTRAVENOUS

## 2014-09-27 MED ORDER — SERTRALINE HCL 50 MG PO TABS
50.0000 mg | ORAL_TABLET | Freq: Every day | ORAL | Status: DC
  Administered 2014-09-28 – 2014-09-30 (×3): 50 mg via ORAL
  Filled 2014-09-27 (×3): qty 1

## 2014-09-27 MED ORDER — ACETAMINOPHEN 650 MG RE SUPP: 650.0000 mg | Freq: Four times a day (QID) | RECTAL | Status: DC | PRN
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
## 2014-09-27 NOTE — ED Notes (Signed)
Pt reports he has had diarrhea all day and vomited once, went to [facility] just to use the bathroom and could not leave for two hours of diarrhea. Also reported a headache around his eyes that has subsided. Denies chest pain.

## 2014-09-27 NOTE — ED Notes (Signed)
Patient transported to X-ray 
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
## 2014-09-27 NOTE — ED Notes (Signed)
Per EMS- Pt was at [facility] and had several episodes of diarrhea. Was there for about two hours due to diarrhea. Staff called EMS from patient being slumped over in a booth. EMS could not feel pulses. Placed patient in trendelenburg and regained pulses. Initial BPs of 90s/50s palpable pulse of 44. Pt was pale and diaphoretic. 18G PIV placed to [place]. 700 NS given in route.

## 2014-09-28 ENCOUNTER — Observation Stay (HOSPITAL_COMMUNITY): Payer: PPO

## 2014-09-28 ENCOUNTER — Encounter (HOSPITAL_COMMUNITY): Payer: Self-pay

## 2014-09-28 DIAGNOSIS — E440 Moderate protein-calorie malnutrition: Secondary | ICD-10-CM | POA: Diagnosis present

## 2014-09-28 LAB — TROPONIN I
Troponin I: <0.03 ng/mL (ref <0.031)
Troponin I: <0.03 ng/mL (ref <0.031)

## 2014-09-28 LAB — COMPREHENSIVE METABOLIC PANEL
ALK PHOS: 66 U/L (ref 39–117)
ALT: 11 U/L (ref 0–53)
AST: 17 U/L (ref 0–37)
Albumin: 3 g/dL — ABNORMAL LOW (ref 3.5–5.2)
Anion gap: 4 — ABNORMAL LOW (ref 5–15)
BUN: 13 mg/dL (ref 6–23)
CALCIUM: 7.9 mg/dL — AB (ref 8.4–10.5)
CO2: 25 mmol/L (ref 19–32)
Chloride: 114 mmol/L — ABNORMAL HIGH (ref 96–112)
Creatinine, Ser: 1.4 mg/dL — ABNORMAL HIGH (ref 0.50–1.35)
GFR calc Af Amer: 54 mL/min — ABNORMAL LOW (ref >90)
GFR calc non Af Amer: 47 mL/min — ABNORMAL LOW (ref >90)
Glucose, Bld: 88 mg/dL (ref 70–99)
Potassium: 3.5 mmol/L (ref 3.5–5.1)
SODIUM: 143 mmol/L (ref 135–145)
TOTAL PROTEIN: 5.4 g/dL — AB (ref 6.0–8.3)
Total Bilirubin: 0.8 mg/dL (ref 0.3–1.2)

## 2014-09-28 LAB — CBC WITH DIFFERENTIAL/PLATELET
BASOS PCT: 1 % (ref 0–1)
Basophils Absolute: 0 10*3/uL (ref 0.0–0.1)
EOS ABS: 0.2 10*3/uL (ref 0.0–0.7)
Eosinophils Relative: 2 % (ref 0–5)
HCT: 34.5 % — ABNORMAL LOW (ref 39.0–52.0)
Hemoglobin: 11.8 g/dL — ABNORMAL LOW (ref 13.0–17.0)
Lymphocytes Relative: 18 % (ref 12–46)
Lymphs Abs: 1.2 10*3/uL (ref 0.7–4.0)
MCH: 31.3 pg (ref 26.0–34.0)
MCHC: 34.2 g/dL (ref 30.0–36.0)
MCV: 91.5 fL (ref 78.0–100.0)
MONOS PCT: 11 % (ref 3–12)
Monocytes Absolute: 0.8 10*3/uL (ref 0.1–1.0)
NEUTROS PCT: 68 % (ref 43–77)
Neutro Abs: 4.5 10*3/uL (ref 1.7–7.7)
Platelets: 170 10*3/uL (ref 150–400)
RBC: 3.77 MIL/uL — ABNORMAL LOW (ref 4.22–5.81)
RDW: 13.6 % (ref 11.5–15.5)
WBC: 6.7 10*3/uL (ref 4.0–10.5)

## 2014-09-28 LAB — GLUCOSE, CAPILLARY
GLUCOSE-CAPILLARY: 85 mg/dL (ref 70–99)
Glucose-Capillary: 88 mg/dL (ref 70–99)
Glucose-Capillary: 80 mg/dL (ref 70–99)
Glucose-Capillary: 119 mg/dL — ABNORMAL HIGH (ref 70–99)

## 2014-09-28 LAB — CLOSTRIDIUM DIFFICILE BY PCR: Toxigenic C. Difficile by PCR: NEGATIVE

## 2014-09-28 LAB — TSH: TSH: 2.761 u[IU]/mL (ref 0.350–4.500)

## 2014-09-28 MED ORDER — HYDRALAZINE HCL 20 MG/ML IJ SOLN: 10.0000 mg | INTRAMUSCULAR | Status: DC | PRN

## 2014-09-28 MED ORDER — IOHEXOL 300 MG/ML  SOLN
50.0000 mL | Freq: Once | INTRAMUSCULAR | Status: AC | PRN
  Administered 2014-09-28: 50 mL via ORAL

## 2014-09-28 MED ORDER — SODIUM CHLORIDE 0.9 % IV SOLN
INTRAVENOUS | Status: DC
  Administered 2014-09-28 – 2014-09-29 (×3): via INTRAVENOUS

## 2014-09-28 MED ORDER — ENSURE COMPLETE PO LIQD
237.0000 mL | ORAL | Status: DC
  Administered 2014-09-28 – 2014-09-29 (×2): 237 mL via ORAL

## 2014-09-28 NOTE — Progress Notes (Signed)
Patient arrived to assigned room @ 2320 from ED via stretcher. Ambulated from stretcher to bed without problems. Armband applied, Admission paperwork/admission assessment complete. Patient/wife oriented to room, call bell, bed controls and patient guide with verbal understanding. All orders reviewed with patient. Patient drinking contrast for ordered Abd CT. Tele monitor applied. Patient instructed to call for assistance to go to bathroom with verbal understanding. Will monitor.

## 2014-09-28 NOTE — Evaluation (Signed)
Physical Therapy Evaluation Patient Details Name: NOLBERTO CHEUVRONT MRN: [id number] DOB: [birth year] Today's Date: 09/28/2014   History of Present Illness  pt admitted after having episode of n/v and diarrhea before having a syncopal episode.  Work up continues.  Suspect dehydration, but diarrhea and stomach upset continues.  Clinical Impression  Pt at baseline function, but still experiencing some of the same symptoms that caused him to come to the hospital though to a lesser degree.  No further PT needs.  Will sign off.    Follow Up Recommendations No PT follow up    Equipment Recommendations  None recommended by PT    Recommendations for Other Services       Precautions / Restrictions        Mobility  Bed Mobility Overal bed mobility: Independent                Transfers                    Ambulation/Gait Ambulation/Gait assistance: Independent Ambulation Distance (Feet): 500 Feet Assistive device: None Gait Pattern/deviations: WFL(Within Functional Limits) Gait velocity: WFL   General Gait Details: functional and fluid  Stairs Stairs: Yes Stairs assistance: Modified independent (Device/Increase time) Stair Management: One rail Right;Alternating pattern;Forwards Number of Stairs: 4 General stair comments: safe  Wheelchair Mobility    Modified Rankin (Stroke Patients Only)       Balance Overall balance assessment: No apparent balance deficits (not formally assessed)                               Standardized Balance Assessment Standardized Balance Assessment : Dynamic Gait Index   Dynamic Gait Index Level Surface: Normal Change in Gait Speed: Normal Gait with Horizontal Head Turns: Mild Impairment Gait with Vertical Head Turns: Normal Gait and Pivot Turn: Normal Step Over Obstacle: Normal Step Around Obstacles: Normal Steps: Mild Impairment Total Score: 22       Pertinent Vitals/Pain Pain Assessment: No/denies pain     Home Living Family/patient expects to be discharged to:: Private residence Living Arrangements: Spouse/significant other Available Help at Discharge: Family Type of Home: House Home Access: Level entry     Home Layout: One level Home Equipment: Kasandra Knudsen - single point      Prior Function Level of Independence: Independent               Hand Dominance        Extremity/Trunk Assessment   Upper Extremity Assessment: Defer to OT evaluation           Lower Extremity Assessment: Overall WFL for tasks assessed      Cervical / Trunk Assessment: Normal  Communication   Communication: No difficulties  Cognition Arousal/Alertness: Awake/alert Behavior During Therapy: WFL for tasks assessed/performed Overall Cognitive Status: Within Functional Limits for tasks assessed                      General Comments      Exercises        Assessment/Plan    PT Assessment Patent does not need any further PT services  PT Diagnosis     PT Problem List    PT Treatment Interventions     PT Goals (Current goals can be found in the Care Plan section) Acute Rehab PT Goals PT Goal Formulation: All assessment and education complete, DC therapy    Frequency  Barriers to discharge        Co-evaluation               End of Session   Activity Tolerance: Patient tolerated treatment well Patient left: with call bell/phone within reach (sitting EOB) Nurse Communication: Mobility status         Time: 2993-7169 PT Time Calculation (min) (ACUTE ONLY): 15 min   Charges:   PT Evaluation $Initial PT Evaluation Tier I: 1 Procedure     PT G Codes:        Khaniya Tenaglia, Tessie Fass 09/28/2014, 5:39 PM 09/28/2014  Donnella Sham, PT [phone]  (pager)

## 2014-09-28 NOTE — Progress Notes (Addendum)
Patient ID: Calvin Burnett, male   DOB: [birth year], 81 y.o.   MRN: [id number]  [name] PROGRESS NOTE  Calvin Burnett KCM:[id number] DOB: 1934/04/08 DOA: 09/27/2014 PCP: Calvin Reddish, MD   Brief narrative:    81 y.o. male with DM II, HTN, CKD stage III, prostate cancer and s/p radiation treatment, presented to [facility] ED after brief syncopal event that occurred several hours prior to the admission. Pt explained that prior to the event, he has had several days duration of multiple episodes of loose stools at times bloody, some nausea, and non bloody vomiting. Pt reports epigastric pain for the past 24 hours, intermittent and 5/10 in severity when present, non radiating, associated with some chest discomfort. No reported seizure like activity, no bowel incontinence or tongue biting. Upon EMS arrival, pt noted to be bradycardic with HR 45 bmp, was also orthostatic by heart rate (up to 140 bmp on standing from 50 bpm with sitting). Pt reports his wife had prolonged course of diarrhea last month, he denies any recent use of antibiotics. Pt also denies similar events in the past.   In ED, pt had two more episodes of loose bowel movements. VS notable for T 80F, HR in 60's. Patient's EKG was discussed with on-call cardiologist Dr. Terrence Burnett by the ER physician and it was confirmed that patient's rhythm was sinus arrhythmia. Patient was admitted by [facility] for further evaluation.   Assessment/Plan:    Active Problems:   Syncope - appears to be secondary to orthostatic changes in the setting of dehydration from GI symptoms - also wondering if pt was hypoglycemic as he has been taking Glipizide per home medical regimen  - currently he denies chest pain and his HR has been stable in 60's - CE x 2 sets are negative  - TSH is WNL - will continue hydration and will ask nurse to repeat orthostatic vitals - continue to monitor on telemetry    Nausea, vomiting, diarrhea - CT abd suggestive of ischemic  colitis, no diverticulitis - stool studies pending, C. Diff is negative  - will consult GI for further assistance  - continue to provide supportive care with IVF, analgesia, antiemetics as needed - pt currently on heart healthy diet, will continue for now as pt desires but if any vomiting, will need to hold off on PO intake   Renal failure (ARF), acute on chronic - likely pre renal etiology from dehydration in the setting of the GI symptoms - IVF provided and Cr is trending down    Bradycardia - unclear etiology, currently resolved and pt with no chest pain  - CE x 2 sets negative  - monitor on telemetry for now   DM type II with complications of CKD III - continue home medical regimen with Glipizide and also add SSI for now   Leukocytosis - appears to be from reactive process rather than an infectious etiology - no ABX provided and WBC is now WNL - will repeat CBC in AM   Moderate PCM - appreciate nutritionist following - will plan on advancing diet as pt able to tolerate   DVT prophylaxis: SCD's  Code Status: Full.  Family Communication:  plan of care discussed with the patient and wife at bedside  Disposition Plan: Home when stable  IV access:  Peripheral IV  Procedures and diagnostic studies:    Ct Abdomen Pelvis Wo Contrast  09/28/2014  Mural thickening and inflammation involving the left hemicolon, suspicious for ischemic colitis although other forms  of colitis may be considered. This is not the appearance of diverticulitis. No obstruction, perforation, pneumatosis, portal venous air or abscess.     CXR 09/27/2014   No evidence for acute  abnormality.     Ct Head Wo Contrast  09/28/2014   No acute intracranial process. Mild atrophy with chronic small vessel ischemic disease.     Medical Consultants:  GI  Other Consultants:  None  IAnti-Infectives:   None  Calvin Ramsay, MD  [facility] Pager [phone]  If 7PM-7AM, please contact  night-coverage [email or web address] Password [email or web address] 09/28/2014, 8:41 AM   LOS: 1 day   HPI/Subjective: No events overnight.   Objective: Filed Vitals:   09/27/14 2200 09/27/14 2215 09/27/14 2320 09/28/14 0517  BP: 143/73 137/72 145/65 137/69  Pulse: 49 53 51 62  Temp:   99 F (37.2 C) 97.8 F (36.6 C)  TempSrc:   Oral Oral  Resp: 12 16 18 18   Height:   5\' 10"  (1.778 m)   Weight:   78.472 kg (173 lb) 78.472 kg (173 lb)  SpO2: 99% 99% 99% 98%    Intake/Output Summary (Last 24 hours) at 09/28/14 0841 Last data filed at 09/28/14 0431  Gross per 24 hour  Intake      0 ml  Output    850 ml  Net   -850 ml    Exam:   General:  Pt is alert, follows commands appropriately, not in acute distress  Cardiovascular: Regular rate and rhythm, no rubs, no gallops  Respiratory: Clear to auscultation bilaterally, no wheezing, no crackles, no rhonchi  Abdomen: Soft, tender in lower abd quadrants, non distended, bowel sounds present, no guarding  Extremities: No edema, pulses DP and PT palpable bilaterally  Neuro: Grossly nonfocal  Data Reviewed: Basic Metabolic Panel:  Recent Labs Lab 09/27/14 1615 09/28/14 0532  NA 140 143  K 4.1 3.5  CL 110 114*  CO2 23 25  GLUCOSE 188* 88  BUN 17 13  CREATININE 1.76* 1.40*  CALCIUM 8.3* 7.9*   Liver Function Tests:  Recent Labs Lab 09/27/14 1615 09/28/14 0532  AST 19 17  ALT 13 11  ALKPHOS 84 66  BILITOT 0.8 0.8  PROT 6.0 5.4*  ALBUMIN 3.4* 3.0*   CBC:  Recent Labs Lab 09/27/14 1615 09/28/14 0532  WBC 13.3* 6.7  NEUTROABS 11.6* 4.5  HGB 13.6 11.8*  HCT 39.4 34.5*  MCV 92.3 91.5  PLT 144* 170   Cardiac Enzymes:  Recent Labs Lab 09/27/14 2354 09/28/14 0532  TROPONINI <0.03 <0.03   CBG:  Recent Labs Lab 09/27/14 2345 09/28/14 0809  GLUCAP 88 88    Recent Results (from the past 240 hour(s))  Clostridium Difficile by PCR     Status: None   Collection Time: 09/27/14  5:29 PM  Result Value Ref Range Status    C difficile by pcr NEGATIVE NEGATIVE Final     Scheduled Meds: . sodium chloride   Intravenous STAT  . amitriptyline  10 mg Oral QHS  . glipiZIDE  5 mg Oral Q breakfast  . insulin aspart  0-9 Units Subcutaneous TID WC  . sertraline  50 mg Oral Daily   Continuous Infusions: . sodium chloride 100 mL/hr at 09/28/14 0037
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
## 2014-09-28 NOTE — Progress Notes (Signed)
Notified Dr. Glenis Smoker that pt is running sinus brady HR 40-50's on telemetry. No new orders given. Will continue to monitor pt. Ranelle Oyster, RN

## 2014-09-29 LAB — CBC
HCT: 36.9 % — ABNORMAL LOW (ref 39.0–52.0)
Hemoglobin: 12.7 g/dL — ABNORMAL LOW (ref 13.0–17.0)
MCH: 31.6 pg (ref 26.0–34.0)
MCHC: 34.4 g/dL (ref 30.0–36.0)
MCV: 91.8 fL (ref 78.0–100.0)
Platelets: 163 10*3/uL (ref 150–400)
RBC: 4.02 MIL/uL — ABNORMAL LOW (ref 4.22–5.81)
RDW: 13.6 % (ref 11.5–15.5)
WBC: 6.1 10*3/uL (ref 4.0–10.5)

## 2014-09-29 LAB — URINALYSIS, ROUTINE W REFLEX MICROSCOPIC
BILIRUBIN URINE: NEGATIVE
GLUCOSE, UA: NEGATIVE mg/dL
KETONES UR: NEGATIVE mg/dL
Leukocytes, UA: NEGATIVE
Nitrite: NEGATIVE
PH: 6.5 (ref 5.0–8.0)
PROTEIN: NEGATIVE mg/dL
SPECIFIC GRAVITY, URINE: 1.01 (ref 1.005–1.030)
Urobilinogen, UA: 0.2 mg/dL (ref 0.0–1.0)

## 2014-09-29 LAB — BASIC METABOLIC PANEL
Anion gap: 12 (ref 5–15)
BUN: 16 mg/dL (ref 6–23)
CALCIUM: 8.5 mg/dL (ref 8.4–10.5)
CO2: 19 mmol/L (ref 19–32)
Chloride: 109 mmol/L (ref 96–112)
Creatinine, Ser: 1.21 mg/dL (ref 0.50–1.35)
GFR calc Af Amer: 64 mL/min — ABNORMAL LOW (ref >90)
GFR calc non Af Amer: 56 mL/min — ABNORMAL LOW (ref >90)
Glucose, Bld: 142 mg/dL — ABNORMAL HIGH (ref 70–99)
Potassium: 3.8 mmol/L (ref 3.5–5.1)
Sodium: 140 mmol/L (ref 135–145)

## 2014-09-29 LAB — GLUCOSE, CAPILLARY
Glucose-Capillary: 97 mg/dL (ref 70–99)
Glucose-Capillary: 88 mg/dL (ref 70–99)
Glucose-Capillary: 132 mg/dL — ABNORMAL HIGH (ref 70–99)
Glucose-Capillary: 116 mg/dL — ABNORMAL HIGH (ref 70–99)

## 2014-09-29 LAB — URINE MICROSCOPIC-ADD ON

## 2014-09-29 NOTE — Progress Notes (Signed)
Patient ID: Calvin Burnett, male   DOB: [birth year], 81 y.o.   MRN: [id number]  [name] PROGRESS NOTE  MARLENE PFLUGER YKZ:[id number] DOB: [birth year] DOA: 09/27/2014 PCP: [name], MD   Brief narrative:    81 y.o. male with DM II, HTN, CKD stage III, prostate cancer and s/p radiation treatment, presented to [facility] ED after brief syncopal event that occurred several hours prior to the admission. Pt explained that prior to the event, he has had several days duration of multiple episodes of loose stools at times bloody, some nausea, and non bloody vomiting. Pt reports epigastric pain for the past 24 hours, intermittent and 5/10 in severity when present, non radiating, associated with some chest discomfort. No reported seizure like activity, no bowel incontinence or tongue biting. Upon EMS arrival, pt noted to be bradycardic with HR 45 bmp, was also orthostatic by heart rate (up to 140 bmp on standing from 50 bpm with sitting). Pt reports his wife had prolonged course of diarrhea last month, he denies any recent use of antibiotics. Pt also denies similar events in the past.   In ED, pt had two more episodes of loose bowel movements. VS notable for T 79F, HR in 60's. Patient's EKG was discussed with on-call cardiologist Dr. Terrence Dupont by the ER physician and it was confirmed that patient's rhythm was sinus arrhythmia. Patient was admitted by [facility] for further evaluation.   Assessment/Plan:    Active Problems:   ? Syncope - appears to be secondary to orthostatic changes in the setting of dehydration from GI symptoms vs bradycardia - TSH is WNL - no events overnight, pt reports feeling better this AM - PT evaluation requested, pt did well, ambulation with no problems - no PT recommendations    Nausea, vomiting, diarrhea - CT abd suggestive of ischemic colitis, no diverticulitis - still with diarrhea, ? Viral etiology   - stool studies pending, C. Diff is negative  - d/w GI on call, no  indication for intervention at this time, keep the outpatient follow up appointment with Dr. Fuller Plan 4/29 - pt tolerating diet well   Renal failure (ARF), acute on chronic - likely pre renal etiology from dehydration in the setting of the GI symptoms - IVF provided and Cr is now WNL    Bradycardia - unclear etiology, currently resolved and pt with no chest pain  - CE x 2 sets negative  - no events on tele - cardio input appreciated    DM type II with complications of CKD III - continue home medical regimen with Glipizide and also add SSI for now   Leukocytosis - appears to be from reactive process rather than an infectious etiology - no ABX provided and WBC remains WNL    Moderate PCM, in the context of acute illness  - appreciate nutritionist following - tolerating current diet well   DVT prophylaxis: SCD's  Code Status: Full.  Family Communication:  plan of care discussed with the patient and wife at bedside  Disposition Plan: Home possibly in AM  IV access:  Peripheral IV  Procedures and diagnostic studies:    Ct Abdomen Pelvis Wo Contrast  09/28/2014  Mural thickening and inflammation involving the left hemicolon, suspicious for ischemic colitis although other forms of colitis may be considered. This is not the appearance of diverticulitis. No obstruction, perforation, pneumatosis, portal venous air or abscess.     CXR 09/27/2014   No evidence for acute  abnormality.     Ct  Head Wo Contrast  09/28/2014   No acute intracranial process. Mild atrophy with chronic small vessel ischemic disease.     Medical Consultants:  Cardiology   Other Consultants:  None  IAnti-Infectives:   None  Faye Ramsay, MD  [facility] Pager [phone]  If 7PM-7AM, please contact night-coverage [email or web address] Password [facility] 09/29/2014, 3:38 PM   LOS: 2 days   HPI/Subjective: No events overnight.   Objective: Filed Vitals:   09/28/14 2201 09/29/14 0528 09/29/14 0533 09/29/14 1350  BP:  106/43  142/74 151/75  Pulse:   46 50  Temp: 99.2 F (37.3 C)  98.2 F (36.8 C) 98 F (36.7 C)  TempSrc: Oral  Oral Oral  Resp: 20  16 20   Height:      Weight:  77.429 kg (170 lb 11.2 oz)    SpO2: 98%  98% 97%    Intake/Output Summary (Last 24 hours) at 09/29/14 1538 Last data filed at 09/29/14 1351  Gross per 24 hour  Intake   1764 ml  Output   2250 ml  Net   -486 ml    Exam:   General:  Pt is alert, follows commands appropriately, not in acute distress  Cardiovascular: Regular rhythm, bradycardia, no rubs, no gallops  Respiratory: Clear to auscultation bilaterally, no wheezing, no crackles, no rhonchi  Abdomen: Soft, tender in lower abd quadrants, non distended, bowel sounds present, no guarding  Extremities: No edema, pulses DP and PT palpable bilaterally  Neuro: Grossly nonfocal  Data Reviewed: Basic Metabolic Panel:  Recent Labs Lab 09/27/14 1615 09/28/14 0532 09/29/14 1409  NA 140 143 140  K 4.1 3.5 3.8  CL 110 114* 109  CO2 23 25 19   GLUCOSE 188* 88 142*  BUN 17 13 16   CREATININE 1.76* 1.40* 1.21  CALCIUM 8.3* 7.9* 8.5   Liver Function Tests:  Recent Labs Lab 09/27/14 1615 09/28/14 0532  AST 19 17  ALT 13 11  ALKPHOS 84 66  BILITOT 0.8 0.8  PROT 6.0 5.4*  ALBUMIN 3.4* 3.0*   CBC:  Recent Labs Lab 09/27/14 1615 09/28/14 0532 09/29/14 1409  WBC 13.3* 6.7 6.1  NEUTROABS 11.6* 4.5  --   HGB 13.6 11.8* 12.7*  HCT 39.4 34.5* 36.9*  MCV 92.3 91.5 91.8  PLT 144* 170 163   Cardiac Enzymes:  Recent Labs Lab 09/27/14 2354 09/28/14 0532 09/28/14 1248  TROPONINI <0.03 <0.03 <0.03   CBG:  Recent Labs Lab 09/28/14 1154 09/28/14 1740 09/28/14 2225 09/29/14 0802 09/29/14 1213  GLUCAP 85 80 119* 97 88    Recent Results (from the past 240 hour(s))  Clostridium Difficile by PCR     Status: None   Collection Time: 09/27/14  5:29 PM  Result Value Ref Range Status   C difficile by pcr NEGATIVE NEGATIVE Final     Scheduled  Meds: . amitriptyline  10 mg Oral QHS  . feeding supplement (ENSURE COMPLETE)  237 mL Oral Q24H  . glipiZIDE  5 mg Oral Q breakfast  . insulin aspart  0-9 Units Subcutaneous TID WC  . sertraline  50 mg Oral Daily   Continuous Infusions:

## 2014-09-29 NOTE — Progress Notes (Signed)
Received call back from [facility] regarding patient Calvin Burnett. She advised put in for STAT EKG, if OK, just watch patient but if patient becomes symptomatic or HR drops in 30's to call her back. EKG being done at this time.

## 2014-09-29 NOTE — Progress Notes (Signed)
Patient SB in 4's, asymptomatic, denies problems. MD paged to notify. Will monitor.

## 2014-09-29 NOTE — Progress Notes (Addendum)
Shift Event: Pt with bradycardia HR 49s., other VSS. Pt asymptomatic.  Record reviewed, pt with hx of bradycardia, trop X3 negative. EKG- possible ectopic atrial bradycardia, HR 49.    Calvin Burnett [name]
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
## 2014-09-30 ENCOUNTER — Telehealth: Payer: Self-pay | Admitting: Family Medicine

## 2014-09-30 DIAGNOSIS — R55 Syncope and collapse: Secondary | ICD-10-CM

## 2014-09-30 LAB — CBC
HCT: 36.8 % — ABNORMAL LOW (ref 39.0–52.0)
Hemoglobin: 12.8 g/dL — ABNORMAL LOW (ref 13.0–17.0)
MCH: 31.7 pg (ref 26.0–34.0)
MCHC: 34.8 g/dL (ref 30.0–36.0)
MCV: 91.1 fL (ref 78.0–100.0)
Platelets: 170 10*3/uL (ref 150–400)
RBC: 4.04 MIL/uL — ABNORMAL LOW (ref 4.22–5.81)
RDW: 13.4 % (ref 11.5–15.5)
WBC: 6.7 10*3/uL (ref 4.0–10.5)

## 2014-09-30 LAB — BASIC METABOLIC PANEL
ANION GAP: 7 (ref 5–15)
BUN: 18 mg/dL (ref 6–23)
CHLORIDE: 107 mmol/L (ref 96–112)
CO2: 28 mmol/L (ref 19–32)
CREATININE: 1.37 mg/dL — AB (ref 0.50–1.35)
Calcium: 8.8 mg/dL (ref 8.4–10.5)
GFR calc Af Amer: 55 mL/min — ABNORMAL LOW (ref >90)
GFR calc non Af Amer: 48 mL/min — ABNORMAL LOW (ref >90)
Glucose, Bld: 83 mg/dL (ref 70–99)
Potassium: 3.8 mmol/L (ref 3.5–5.1)
Sodium: 142 mmol/L (ref 135–145)

## 2014-09-30 LAB — GLUCOSE, CAPILLARY
Glucose-Capillary: 87 mg/dL (ref 70–99)
Glucose-Capillary: 96 mg/dL (ref 70–99)

## 2014-09-30 NOTE — Progress Notes (Signed)
  Echocardiogram 2D Echocardiogram has been performed.  Calvin Burnett 09/30/2014, 3:08 PM

## 2014-09-30 NOTE — Progress Notes (Signed)
    Subjective:  Pt says diarrhea has resolved. He has not ambulated in the hall yet. No further near syncope/ syncope.  Objective:  Vital Signs in the last 24 hours: Temp:  [98 F (36.7 C)-98.9 F (37.2 C)] 98 F (36.7 C) (03/18 0640) Pulse Rate:  [50-61] 50 (03/18 0640) Resp:  [18-20] 20 (03/18 0640) BP: (95-151)/(48-75) 106/52 mmHg (03/18 0655) SpO2:  [97 %-98 %] 97 % (03/18 0640) Weight:  [167 lb 14.4 oz (76.159 kg)] 167 lb 14.4 oz (76.159 kg) (03/18 7096)  Intake/Output from previous day:  Intake/Output Summary (Last 24 hours) at 09/30/14 1030 Last data filed at 09/30/14 1015  Gross per 24 hour  Intake    462 ml  Output    800 ml  Net   -338 ml    Physical Exam: General appearance: alert, cooperative and no distress Lungs: clear to auscultation bilaterally Heart: regular rate and rhythm Extremities: no edema   Rate: 50  Rhythm: normal sinus rhythm and with ? blocked PACs followed by a PVC and pause. No significant bradycardia.   Lab Results:  Recent Labs  09/29/14 1409 09/30/14 0621  WBC 6.1 6.7  HGB 12.7* 12.8*  PLT 163 170    Recent Labs  09/29/14 1409 09/30/14 0621  NA 140 142  K 3.8 3.8  CL 109 107  CO2 19 28  GLUCOSE 142* 83  BUN 16 18  CREATININE 1.21 1.37*    Recent Labs  09/28/14 0532 09/28/14 1248  TROPONINI <0.03 <0.03   No results for input(s): INR in the last 72 hours.  Imaging: Imaging results have been reviewed  Cardiac Studies:  Assessment/Plan:   Principal Problem:   Syncope Active Problems:   Bradycardia   Essential hypertension   Diabetes mellitus type II, controlled   CKD (chronic kidney disease), stage III   Dehydration   Diarrhea   GERD   Prostate cancer   Dyslipidemia   Malnutrition of moderate degree   PLAN: Possible discharge later today. Altace and Namenda held. Echo never ordered- will try and get this am. If LVF normal will arrange for OP GXT and f/u with Dr Stanford Breed.   Kerin Ransom PA-C Beeper  [phone] 09/30/2014, 10:30 AM  Patient seen.  He is feeling better.  He has had no further diarrhea.  He has not ambulated in the hall yet.  He denies any prior problems with his heart or with syncope.  He has a systolic murmur at the aortic area.  He has not been told about this before.  I agree we should try to get an echocardiogram on him today prior to discharge.  If echocardiogram shows no significant problem, he would be okay for discharge and follow-up with Dr. Stanford Breed

## 2014-09-30 NOTE — Discharge Instructions (Signed)

## 2014-09-30 NOTE — Telephone Encounter (Signed)
Pt is being discharged today and the Hospital called for hosp fup.  Pt has an appt on 10/04/14 do we need to cancel this appt an schedule him for another one in a couple of weeks.

## 2014-09-30 NOTE — Care Management Note (Signed)
    Page 1 of 1   09/30/2014     5:36:58 PM CARE MANAGEMENT NOTE 09/30/2014  Patient:  Calvin Burnett, Calvin Burnett   Account Number:  [id number]  Date Initiated:  09/30/2014  Documentation initiated by:  Tomi Bamberger  Subjective/Objective Assessment:   dx syncope, dehydration  admit- lives with spouse.     Action/Plan:   Anticipated DC Date:  09/30/2014   Anticipated DC Plan:  HOME/SELF CARE         Choice offered to / List presented to:             Status of service:  Completed, signed off Medicare Important Message given?  YES (If response is "NO", the following Medicare IM given date fields will be blank) Date Medicare IM given:  09/30/2014 Medicare IM given by:  Tomi Bamberger Date Additional Medicare IM given:   Additional Medicare IM given by:    Discharge Disposition:  HOME/SELF CARE  Per UR Regulation:  Reviewed for med. necessity/level of care/duration of stay  If discussed at [place] of Stay Meetings, dates discussed:    Comments:  09/30/14 Angelica, BSN [phone] no needs.

## 2014-09-30 NOTE — Telephone Encounter (Signed)
Please advise regarding hosp f/u

## 2014-09-30 NOTE — Progress Notes (Signed)
Patient discharge teaching given, including activity, diet, follow-up appoints, and medications. Patient verbalized understanding of all discharge instructions. IV access was d/c'd. Vitals are stable. Skin is intact except as charted in most recent assessments. Pt to be escorted out by NT, to be driven home by family. 

## 2014-09-30 NOTE — Progress Notes (Signed)
Medicare Important Message given?  YES (If response is "NO", the following Medicare IM given date fields will be blank) Date Medicare IM given:  09/30/14 Medicare IM given by:  Rylei Codispoti 

## 2014-09-30 NOTE — Discharge Summary (Signed)
Physician Discharge Summary  Calvin Burnett ZSW:[id number] DOB: 06/08/1934 DOA: 09/27/2014  PCP: [name], MD  Admit date: 09/27/2014 Discharge date: 09/30/2014  Recommendations for Outpatient Follow-up:  1. Pt will need to follow up with PCP in 2-3 weeks post discharge 2. Please obtain BMP to evaluate electrolytes and kidney function 3. Please also check CBC to evaluate Hg and Hct levels 4. Pt advised to call oncology office and to follow up with Dr. Alen Blew for further recommendations on prostate cancer  5. Pt also advised to keep appointment with Dr. Fuller Plan 4/29, he will be called if there is any sooner appointment available   Discharge Diagnoses:  Active Problems:   Dehydration   Renal failure (ARF), acute on chronic   Diarrhea   Syncope   Bradycardia   Malnutrition of moderate degree  Discharge Condition: Stable  Diet recommendation: Heart healthy diet discussed in details    Brief narrative:    81 y.o. male with DM II, HTN, CKD stage III, prostate cancer and s/p radiation treatment, presented to [facility] ED after brief syncopal event that occurred several hours prior to the admission. Pt explained that prior to the event, he has had several days duration of multiple episodes of loose stools at times bloody, some nausea, and non bloody vomiting. Pt reports epigastric pain for the past 24 hours, intermittent and 5/10 in severity when present, non radiating, associated with some chest discomfort. No reported seizure like activity, no bowel incontinence or tongue biting. Upon EMS arrival, pt noted to be bradycardic with HR 45 bmp, was also orthostatic by heart rate (up to 140 bmp on standing from 50 bpm with sitting). Pt reports his wife had prolonged course of diarrhea last month, he denies any recent use of antibiotics. Pt also denies similar events in the past.   In ED, pt had two more episodes of loose bowel movements. VS notable for T 78F, HR in 60's. Patient's EKG was discussed  with on-call cardiologist Dr. Terrence Dupont by the ER physician and it was confirmed that patient's rhythm was sinus arrhythmia. Patient was admitted by [facility] for further evaluation.   Assessment/Plan:    Active Problems:  ? Syncope - appears to be secondary to orthostatic changes in the setting of dehydration from GI symptoms vs bradycardia - TSH is WNL - no events overnight, pt reports feeling better this AM - PT evaluation requested, pt did well, ambulation with no problems - no PT recommendations   Nausea, vomiting, diarrhea - CT abd suggestive of ischemic colitis, no diverticulitis - diarrhea resolved - stool studies pending on discharge, C. Diff is negative  - d/w GI on call, no indication for intervention at this time, keep the outpatient follow up appointment with Dr. Fuller Plan 4/29 - pt tolerating diet well  Renal failure (ARF), acute on chronic - likely pre renal etiology from dehydration in the setting of the GI symptoms - IVF provided and Cr improving overall   Bradycardia - unclear etiology, currently resolved and pt with no chest pain  - CE x 2 sets negative  - no events on tele - cardio input appreciated, no indication for intervention at this time   DM type II with complications of CKD III - continue home medical regimen with Glipizide   Leukocytosis - appears to be from reactive process rather than an infectious etiology - no ABX provided and WBC remains WNL   Moderate PCM, in the context of acute illness  - appreciate nutritionist following - tolerating  current diet well   DVT prophylaxis: SCD's  Code Status: Full.  Family Communication: plan of care discussed with the patient and wife at bedside  Disposition Plan: Home  IV access:  Peripheral IV  Procedures and diagnostic studies:   Ct Abdomen Pelvis Wo Contrast 09/28/2014 Mural thickening and inflammation involving the left hemicolon, suspicious for ischemic colitis although other forms of  colitis may be considered. This is not the appearance of diverticulitis. No obstruction, perforation, pneumatosis, portal venous air or abscess.   CXR 09/27/2014 No evidence for acute abnormality.   Ct Head Wo Contrast 09/28/2014 No acute intracranial process. Mild atrophy with chronic small vessel ischemic disease.   Medical Consultants:  Cardiology   Other Consultants:  None  IAnti-Infectives:   None      Discharge Exam: Filed Vitals:   09/30/14 0655  BP: 106/52  Pulse:   Temp:   Resp:    Filed Vitals:   09/29/14 2110 09/30/14 0638 09/30/14 0640 09/30/14 0655  BP: 139/73  95/48 106/52  Pulse: 61  50   Temp: 98.9 F (37.2 C)  98 F (36.7 C)   TempSrc: Oral  Oral   Resp: 18  20   Height:      Weight:  76.159 kg (167 lb 14.4 oz)    SpO2: 98%  97%     General: Pt is alert, follows commands appropriately, not in acute distress Cardiovascular: Regular rate and rhythm, S1/S2 +, no murmurs, no rubs, no gallops Respiratory: Clear to auscultation bilaterally, no wheezing, no crackles, no rhonchi Abdominal: Soft, non tender, non distended, bowel sounds +, no guarding Extremities: no edema, no cyanosis, pulses palpable bilaterally DP and PT Neuro: Grossly nonfocal  Discharge Instructions     Medication List    STOP taking these medications        memantine 28 MG Cp24 24 hr capsule  Commonly known as:  NAMENDA XR     ramipril 5 MG capsule  Commonly known as:  ALTACE      TAKE these medications        amitriptyline 10 MG tablet  Commonly known as:  ELAVIL  TAKE 1 TABLET BY MOUTH AT BEDTIME     aspirin 81 MG tablet  Take 81 mg by mouth daily.     glipiZIDE 5 MG 24 hr tablet  Commonly known as:  GLUCOTROL XL  Take 1 tablet (5 mg total) by mouth daily.     glucose blood test strip  Commonly known as:  ONE TOUCH TEST STRIPS  Daily prn--250.00     ONE TOUCH LANCETS Misc  1 each by Does not apply route daily. 250.00     ranitidine 150  MG tablet  Commonly known as:  ZANTAC  Take 1 tablet (150 mg total) by mouth 2 (two) times daily.     sertraline 50 MG tablet  Commonly known as:  ZOLOFT  Take 1 tablet (50 mg total) by mouth daily.     SLEEP AID PO  Take 1 tablet by mouth at bedtime as needed (FOR SLEEP).           Follow-up Information    Follow up with Norberto Sorenson T. Fuller Plan, MD On 11/11/2014.   Specialty:  Gastroenterology   Why:  keep current appointment and they will call you if something opens up sooner    Contact information:   [address] [place] [address] [phone]       Follow up with [facility], MD.  Specialty:  Oncology   Why:  center will call you with the appointment time and date   Contact information:   [address] [address] [phone]       Follow up with Faye Ramsay, MD.   Specialty:  Internal Medicine   Why:  As needed, If symptoms worsen, call my cell phone [phone]   Contact information:   [address] [place] [address] [phone]        The results of significant diagnostics from this hospitalization (including imaging, microbiology, ancillary and laboratory) are listed below for reference.     Microbiology: Recent Results (from the past 240 hour(s))  Clostridium Difficile by PCR     Status: None   Collection Time: 09/27/14  5:29 PM  Result Value Ref Range Status   C difficile by pcr NEGATIVE NEGATIVE Final  Stool culture     Status: None (Preliminary result)   Collection Time: 09/27/14  5:29 PM  Result Value Ref Range Status   Specimen Description STOOL  Final   Special Requests NONE  Final   Culture   Final    NO SUSPICIOUS COLONIES, CONTINUING TO HOLD Performed at [facility]    Report Status PENDING  Incomplete     Labs: Basic Metabolic Panel:  Recent Labs Lab 09/27/14 1615 09/28/14 0532 09/29/14 1409 09/30/14 0621  NA 140 143 140 142  K 4.1 3.5 3.8 3.8  CL 110 114* 109 107   CO2 23 25 19 28   GLUCOSE 188* 88 142* 83  BUN 17 13 16 18   CREATININE 1.76* 1.40* 1.21 1.37*  CALCIUM 8.3* 7.9* 8.5 8.8   Liver Function Tests:  Recent Labs Lab 09/27/14 1615 09/28/14 0532  AST 19 17  ALT 13 11  ALKPHOS 84 66  BILITOT 0.8 0.8  PROT 6.0 5.4*  ALBUMIN 3.4* 3.0*   CBC:  Recent Labs Lab 09/27/14 1615 09/28/14 0532 09/29/14 1409 09/30/14 0621  WBC 13.3* 6.7 6.1 6.7  NEUTROABS 11.6* 4.5  --   --   HGB 13.6 11.8* 12.7* 12.8*  HCT 39.4 34.5* 36.9* 36.8*  MCV 92.3 91.5 91.8 91.1  PLT 144* 170 163 170   Cardiac Enzymes:  Recent Labs Lab 09/27/14 2354 09/28/14 0532 09/28/14 1248  TROPONINI <0.03 <0.03 <0.03    CBG:  Recent Labs Lab 09/29/14 0802 09/29/14 1213 09/29/14 1701 09/29/14 2144 09/30/14 0803  GLUCAP 97 88 132* 116* 87   SIGNED: Time coordinating discharge: Over 30 minutes  MAGICK-Lillyann Ahart, MD  [name] 09/30/2014, 9:57 AM Pager [phone]  If 7PM-7AM, please contact night-coverage [email or web address] Password [email or web address]

## 2014-10-01 NOTE — Telephone Encounter (Signed)
Can you use the 10:30 same day slot and expand his visit to 30 minutes for hosptial follow up please

## 2014-10-02 LAB — STOOL CULTURE

## 2014-10-03 ENCOUNTER — Telehealth: Payer: Self-pay | Admitting: Cardiology

## 2014-10-03 NOTE — Telephone Encounter (Signed)
Closed encounter °

## 2014-10-04 ENCOUNTER — Encounter: Payer: Self-pay | Admitting: Family Medicine

## 2014-10-04 ENCOUNTER — Ambulatory Visit (INDEPENDENT_AMBULATORY_CARE_PROVIDER_SITE_OTHER): Payer: PPO | Admitting: Family Medicine

## 2014-10-04 ENCOUNTER — Telehealth: Payer: Self-pay | Admitting: Oncology

## 2014-10-04 VITALS — BP 110/70 | Temp 97.9°F | Wt 173.0 lb

## 2014-10-04 DIAGNOSIS — D649 Anemia, unspecified: Secondary | ICD-10-CM | POA: Diagnosis not present

## 2014-10-04 DIAGNOSIS — R55 Syncope and collapse: Secondary | ICD-10-CM | POA: Diagnosis not present

## 2014-10-04 DIAGNOSIS — R197 Diarrhea, unspecified: Secondary | ICD-10-CM

## 2014-10-04 DIAGNOSIS — N179 Acute kidney failure, unspecified: Secondary | ICD-10-CM

## 2014-10-04 LAB — BASIC METABOLIC PANEL
BUN: 22 mg/dL (ref 6–23)
CO2: 28 mEq/L (ref 19–32)
Calcium: 8.8 mg/dL (ref 8.4–10.5)
Chloride: 107 mEq/L (ref 96–112)
Creatinine, Ser: 1.24 mg/dL (ref 0.40–1.50)
GFR: 59.79 mL/min — AB (ref >60.00)
Glucose, Bld: 156 mg/dL — ABNORMAL HIGH (ref 70–99)
POTASSIUM: 4.3 meq/L (ref 3.5–5.1)
SODIUM: 141 meq/L (ref 135–145)

## 2014-10-04 LAB — CBC WITH DIFFERENTIAL/PLATELET
BASOS PCT: 0.7 % (ref 0.0–3.0)
Basophils Absolute: 0 10*3/uL (ref 0.0–0.1)
Eosinophils Absolute: 0.3 10*3/uL (ref 0.0–0.7)
Eosinophils Relative: 5 % (ref 0.0–5.0)
HCT: 39.7 % (ref 39.0–52.0)
HEMOGLOBIN: 13.6 g/dL (ref 13.0–17.0)
Lymphocytes Relative: 21.2 % (ref 12.0–46.0)
Lymphs Abs: 1.1 10*3/uL (ref 0.7–4.0)
MCHC: 34.2 g/dL (ref 30.0–36.0)
MCV: 93.1 fl (ref 78.0–100.0)
Monocytes Absolute: 0.3 10*3/uL (ref 0.1–1.0)
Monocytes Relative: 6.4 % (ref 3.0–12.0)
NEUTROS ABS: 3.4 10*3/uL (ref 1.4–7.7)
Neutrophils Relative %: 66.7 % (ref 43.0–77.0)
Platelets: 201 10*3/uL (ref 150.0–400.0)
RBC: 4.26 Mil/uL (ref 4.22–5.81)
RDW: 14.3 % (ref 11.5–15.5)
WBC: 5 10*3/uL (ref 4.0–10.5)

## 2014-10-04 NOTE — Assessment & Plan Note (Signed)
Hospital follow up for syncope likely orthostatic 09/2014. Orthostatic due to viral gastroenteritis/diarrhea as cause. CT head negative. Echo shoewed EF 55%, no diastolyc dysfunction, calcified aortic leaflets without stenosis. Likely was orthostatic due to diarrhea. Diarrhea resolved. He will have follow-up with Dr. Stanford Breed for outpatient stress testing.His blood pressure is still on the lower end of the spectrum for him 110/70.   I'm hesitant to add ramipril or Namenda back on yet. We will continue off of medications and follow-up in one month to reassess restart.

## 2014-10-04 NOTE — Progress Notes (Signed)
[name], MD Phone: [phone]  Subjective:   Calvin Burnett is a 81 y.o. year old very pleasant male patient who presents with the following:  Hospital follow up for syncope likely orthostatic -Admitted for syncopal episode occuring after several days of loose stools which were at times bloody as well as several days of chest discomfort. Bradycardic to 45 when EMS arrived and was orthostatic. Had nto had recent antibiotics. Syncope thought to be orthostatic. TSH was normal. CT abdomen concerning for ischemic colitis but thought diarrhea more likely viral. CT head without acute intracranial process. Has outpatient follow up with Dr. Fuller Plan. Stool cultures negative and c. Diff negative. Cardiac enzymes negative.   Kidney functoin returned largely to normal withabseline 1.2-1.3 and up to 1.76 and at 1.37 on day of discharge.   altace and namenda held at time of admission. Echo shoewed EF 55%, no diastolyc dysfunction, calcified aortic leaflets without stenosis. Plan was for outpatient stress test and follow up with Dr. Stanford Breed  D/c follow up advised repeat BMP for AKI f/u acute on chronic, CBC to evaluate hemoglobin  Today, states no more diarrhea, no further syncopal episodes.   ROS-since leaving the hospital he denies any diarrhea, nausea, vomiting, chest pain, syncope, lightheadedness.  Past Medical History- Patient Active Problem List   Diagnosis Date Noted  . Prostate cancer 08/12/2012    Priority: High  . Diabetes mellitus type II, controlled 07/31/2010    Priority: High  . Syncope 09/27/2014    Priority: Medium  . CKD (chronic kidney disease), stage III 06/01/2014    Priority: Medium  . Dyslipidemia 03/01/2014    Priority: Medium  . Basal cell carcinoma 05/03/2009    Priority: Medium  . Memory loss 07/27/2008    Priority: Medium  . Essential hypertension 01/30/2007    Priority: Medium  . LEG CRAMPS, NOCTURNAL 04/30/2010    Priority: Low  . Cervical spondylosis  without myelopathy 12/11/2007    Priority: Low  . GERD 11/05/2007    Priority: Low  . Bradycardia 09/27/2014   Medications- reviewed and updated Current Outpatient Prescriptions  Medication Sig Dispense Refill  . amitriptyline (ELAVIL) 10 MG tablet TAKE 1 TABLET BY MOUTH AT BEDTIME 90 tablet 1  . aspirin 81 MG tablet Take 81 mg by mouth daily.    Marland Kitchen glipiZIDE (GLUCOTROL XL) 5 MG 24 hr tablet Take 1 tablet (5 mg total) by mouth daily. 30 tablet 5  . glucose blood (ONE TOUCH TEST STRIPS) test strip Daily prn--250.00 100 each 3  . ONE TOUCH LANCETS MISC 1 each by Does not apply route daily. 250.00 200 each prn  . ranitidine (ZANTAC) 150 MG tablet Take 1 tablet (150 mg total) by mouth 2 (two) times daily. 60 tablet 5  . sertraline (ZOLOFT) 50 MG tablet Take 1 tablet (50 mg total) by mouth daily. 30 tablet 5   Objective: BP 110/70 mmHg  Temp(Src) 97.9 F (36.6 C)  Wt 173 lb (78.472 kg) Gen: NAD, resting comfortably in chair, stands without lightheadedness reported CV: RRR no murmurs rubs or gallops Lungs: CTAB no crackles, wheeze, rhonchi Abdomen: soft/nontender/nondistended/normal bowel sounds. No rebound or guarding.  Ext: no edema Skin: warm, dry, no rash Neuro: grossly normal, moves all extremities, oriented to person, place, year, reason for visit.   Assessment/Plan:  Syncope Hospital follow up for syncope likely orthostatic 09/2014. Orthostatic due to viral gastroenteritis/diarrhea as cause. CT head negative. Echo shoewed EF 55%, no diastolyc dysfunction, calcified aortic leaflets without stenosis. Likely was  orthostatic due to diarrhea. Diarrhea resolved. He will have follow-up with Dr. Stanford Breed for outpatient stress testing.His blood pressure is still on the lower end of the spectrum for him 110/70.   I'm hesitant to add ramipril or Namenda back on yet. We will continue off of medications and follow-up in one month to reassess restart.    AKI- resolved on labs  today Anemia-resolved on CBC today. No further rectal bleeding. Still needs to evaluation  by GI for potential colonoscopy  Return precautions advised. 1 month follow up planned  Results for orders placed or performed in visit on 10/04/14 (from the past 24 hour(s))  CBC with Differential/Platelet     Status: None   Collection Time: 10/04/14 12:00 PM  Result Value Ref Range   WBC 5.0 4.0 - 10.5 K/uL   RBC 4.26 4.22 - 5.81 Mil/uL   Hemoglobin 13.6 13.0 - 17.0 g/dL   HCT 39.7 39.0 - 52.0 %   MCV 93.1 78.0 - 100.0 fl   MCHC 34.2 30.0 - 36.0 g/dL   RDW 14.3 11.5 - 15.5 %   Platelets 201.0 150.0 - 400.0 K/uL   Neutrophils Relative % 66.7 43.0 - 77.0 %   Lymphocytes Relative 21.2 12.0 - 46.0 %   Monocytes Relative 6.4 3.0 - 12.0 %   Eosinophils Relative 5.0 0.0 - 5.0 %   Basophils Relative 0.7 0.0 - 3.0 %   Neutro Abs 3.4 1.4 - 7.7 K/uL   Lymphs Abs 1.1 0.7 - 4.0 K/uL   Monocytes Absolute 0.3 0.1 - 1.0 K/uL   Eosinophils Absolute 0.3 0.0 - 0.7 K/uL   Basophils Absolute 0.0 0.0 - 0.1 K/uL  Basic metabolic panel     Status: Abnormal   Collection Time: 10/04/14 12:00 PM  Result Value Ref Range   Sodium 141 135 - 145 mEq/L   Potassium 4.3 3.5 - 5.1 mEq/L   Chloride 107 96 - 112 mEq/L   CO2 28 19 - 32 mEq/L   Glucose, Bld 156 (H) 70 - 99 mg/dL   BUN 22 6 - 23 mg/dL   Creatinine, Ser 1.24 0.40 - 1.50 mg/dL   Calcium 8.8 8.4 - 10.5 mg/dL   GFR 59.79 (L) >60.00 mL/min

## 2014-10-04 NOTE — Telephone Encounter (Signed)
Called pt to confirm appt for Dr. Alen Blew. Left message.  TG  Appt: 11/03/14 10:30am per Dr. Alen Blew

## 2014-10-04 NOTE — Patient Instructions (Addendum)
Schedule an eye exam and have them fax [place] the records at [phone].  Update labs today considering the bump you had in your kidney function in hospital.   I am glad you are doing so much better.   Do not take:  Ramipril/altace  Namenda/memantine  See me in 1 month and we will see if we can restart

## 2014-10-11 ENCOUNTER — Telehealth: Payer: Self-pay | Admitting: Family Medicine

## 2014-10-11 NOTE — Telephone Encounter (Signed)
[facility] - Client [place] Call [facility] Patient Name: Calvin Burnett DOB: Sep 21, 1933 Initial Comment Caller states her husband has blood in stool and having urination pain and as been taking meds. She has some questions regarding his symptoms. (NEED NEXT ATTEMPT AT 11:30) Nurse Assessment Nurse: Donalynn Furlong, RN, Myna Hidalgo Date/Time Eilene Ghazi Time): 10/11/2014 11:31:35 AM Confirm and document reason for call. If symptomatic, describe symptoms. ---Caller states her husband has blood in stool and having urination pain and as been taking meds. She has some questions regarding his symptoms. (NEED NEXT ATTEMPT AT 11:30 Has the patient traveled out of the country within the last 30 days? ---No Does the patient require triage? ---Yes Related visit to physician within the last 2 weeks? ---Yes Does the PT have any chronic conditions? (i.e. diabetes, asthma, etc.) ---Yes List chronic conditions. ---prostate cancer Guidelines Guideline Title Affirmed Question Affirmed Notes Rectal Bleeding Rectal bleeding is minimal (e.g., blood just on toilet paper, a few drops in toilet bowl) (all triage questions negative) Urinary Symptoms Urinating more frequently than usual (i.e., frequency) Final Disposition User See Physician within [place], RN, [place]. call to pt to inform of 3/30 at 1:45 appt w Dr Elease Hashimoto pt states the bleeding is resolved, pt has had BM's today without any S/S bleeding, and that pt has momentarily taken themselves off of ASA as that has caused bleeding in the past. Pt otherwise stable and currently out at [place] with his wife

## 2014-10-11 NOTE — Telephone Encounter (Signed)
Calvin Burnett has moved pt to our last SDA tomorrow

## 2014-10-11 NOTE — Telephone Encounter (Signed)
FYI appt made with Dr. Elease Hashimoto for tomorrow.

## 2014-10-11 NOTE — Telephone Encounter (Signed)
Can we not see with my last same day slot tomorrow morning?

## 2014-10-12 ENCOUNTER — Ambulatory Visit: Payer: Self-pay | Admitting: Family Medicine

## 2014-10-13 ENCOUNTER — Ambulatory Visit (INDEPENDENT_AMBULATORY_CARE_PROVIDER_SITE_OTHER): Payer: PPO | Admitting: Family Medicine

## 2014-10-13 ENCOUNTER — Telehealth: Payer: Self-pay | Admitting: Gastroenterology

## 2014-10-13 ENCOUNTER — Encounter: Payer: Self-pay | Admitting: Family Medicine

## 2014-10-13 VITALS — BP 120/84 | HR 58 | Temp 97.9°F | Wt 170.0 lb

## 2014-10-13 DIAGNOSIS — R358 Other polyuria: Secondary | ICD-10-CM

## 2014-10-13 DIAGNOSIS — R1033 Periumbilical pain: Secondary | ICD-10-CM

## 2014-10-13 DIAGNOSIS — R3589 Other polyuria: Secondary | ICD-10-CM

## 2014-10-13 DIAGNOSIS — R310 Gross hematuria: Secondary | ICD-10-CM

## 2014-10-13 LAB — POCT URINALYSIS DIPSTICK
BILIRUBIN UA: NEGATIVE
Glucose, UA: NEGATIVE
KETONES UA: NEGATIVE
Leukocytes, UA: NEGATIVE
Nitrite, UA: NEGATIVE
Spec Grav, UA: 1.025
Urobilinogen, UA: 0.2
pH, UA: 5.5

## 2014-10-13 NOTE — Progress Notes (Signed)
Calvin Reddish, MD Phone: [phone]  Subjective:   Calvin Burnett is a 81 y.o. year old very pleasant male patient who presents with the following:  Abdominal Pain/fatigue -Diffuse Lower abdominal pain starting today. Feels fatigued as well.  4-5/10. Diarrhea has not restarted. When he eats or drinks anything, he ends up peeing and stooling within 30 minutes. Very heavy gas. No longer with diarrhea but stool is thin about the size of pinky finger. He has had chronic pelvic and rectal pain. Pain with defecation and urination. History radioactive seeds 2014. Treated for UTI September. Hyoscyamine helps but then wears off. Urology had tried to get into GI for further evaluation but earliest appointment with Dr. Fuller Plan was 4/29. 2 days ago noted blood in urine.   ROS- no fever/chills/nausea/vomiting. No lightheadedness.  Past Medical History- Patient Active Problem List   Diagnosis Date Noted  . Prostate cancer 08/12/2012    Priority: High  . Diabetes mellitus type II, controlled 07/31/2010    Priority: High  . Syncope 09/27/2014    Priority: Medium  . CKD (chronic kidney disease), stage III 06/01/2014    Priority: Medium  . Dyslipidemia 03/01/2014    Priority: Medium  . Basal cell carcinoma 05/03/2009    Priority: Medium  . Memory loss 07/27/2008    Priority: Medium  . Essential hypertension 01/30/2007    Priority: Medium  . LEG CRAMPS, NOCTURNAL 04/30/2010    Priority: Low  . Cervical spondylosis without myelopathy 12/11/2007    Priority: Low  . GERD 11/05/2007    Priority: Low  . Bradycardia 09/27/2014   Medications- reviewed and updated Current Outpatient Prescriptions  Medication Sig Dispense Refill  . amitriptyline (ELAVIL) 10 MG tablet TAKE 1 TABLET BY MOUTH AT BEDTIME 90 tablet 1  . aspirin 81 MG tablet Take 81 mg by mouth daily.    Marland Kitchen glipiZIDE (GLUCOTROL XL) 5 MG 24 hr tablet Take 1 tablet (5 mg total) by mouth daily. 30 tablet 5  . glucose blood (ONE TOUCH TEST  STRIPS) test strip Daily prn--250.00 100 each 3  . ONE TOUCH LANCETS MISC 1 each by Does not apply route daily. 250.00 200 each prn  . ranitidine (ZANTAC) 150 MG tablet Take 1 tablet (150 mg total) by mouth 2 (two) times daily. 60 tablet 5  . sertraline (ZOLOFT) 50 MG tablet Take 1 tablet (50 mg total) by mouth daily. 30 tablet 5   Objective: BP 120/84 mmHg  Pulse 58  Temp(Src) 97.9 F (36.6 C) (Oral)  Wt 170 lb (77.111 kg) Gen: NAD, resting comfortably CV: RRR no murmurs rubs or gallops- not brday Lungs: CTAB no crackles, wheeze, rhonchi Abdomen: soft/mild diffuse tenderness/nondistended/normal bowel sounds. No rebound or guarding.  No suprapubic pain Ext: no edema Skin: warm, dry, no rash   Results for orders placed or performed in visit on 10/13/14 (from the past 24 hour(s))  POCT urinalysis dipstick     Status: None   Collection Time: 10/13/14  2:47 PM  Result Value Ref Range   Color, UA yellow    Clarity, UA clear    Glucose, UA neg    Bilirubin, UA neg    Ketones, UA neg    Spec Grav, UA 1.025    Blood, UA 2+    pH, UA 5.5    Protein, UA 1+    Urobilinogen, UA 0.2    Nitrite, UA neg    Leukocytes, UA Negative     Assessment/Plan:  Abdominal Pain/fatigue Has had extensive  urology evaluation for pelvic pain. Has had workup for hematuria as well. They desired GI eval and also patient had recent CT scan after hospitalization for diarrhea showing potential ischemic colitis with other forms of colitis to be considered. ? If he could be dealing with some radiation seed related effects. I think he does need a colonoscopy with change in bowel caliper.  -called GI and obtained sooner appointment -agree with patient to likely cancel oncology appointment and urology managing prostate cancer.  -check urine culture though hematuria could be due to friable prostate vessels   Orders Placed This Encounter  Procedures  . Urine culture

## 2014-10-13 NOTE — Patient Instructions (Signed)
We are going to try to move up your GI doctor appointment. We will give you a call.   Call urology to see if they think you need to see the oncologist. I think you may be able to cancel this.   Check your urine today

## 2014-10-13 NOTE — Telephone Encounter (Signed)
I spoke with [place].  Patient will come in and see Tye Savoy RNP tomorrow at 9:30

## 2014-10-14 ENCOUNTER — Encounter: Payer: Self-pay | Admitting: Nurse Practitioner

## 2014-10-14 ENCOUNTER — Telehealth: Payer: Self-pay | Admitting: *Deleted

## 2014-10-14 ENCOUNTER — Ambulatory Visit (INDEPENDENT_AMBULATORY_CARE_PROVIDER_SITE_OTHER): Payer: PPO | Admitting: Nurse Practitioner

## 2014-10-14 VITALS — BP 128/80 | HR 64 | Ht 68.0 in | Wt 171.1 lb

## 2014-10-14 DIAGNOSIS — K6289 Other specified diseases of anus and rectum: Secondary | ICD-10-CM | POA: Diagnosis not present

## 2014-10-14 DIAGNOSIS — R194 Change in bowel habit: Secondary | ICD-10-CM | POA: Diagnosis not present

## 2014-10-14 DIAGNOSIS — K625 Hemorrhage of anus and rectum: Secondary | ICD-10-CM | POA: Diagnosis not present

## 2014-10-14 MED ORDER — LIDOCAINE-HYDROCORTISONE ACE 3-0.5 % RE CREA: 1.0000 | TOPICAL_CREAM | Freq: Two times a day (BID) | RECTAL | Status: DC

## 2014-10-14 NOTE — Patient Instructions (Addendum)
You have been scheduled for a colonoscopy. Please follow written instructions given to you at your visit today.  We have given you a free colonocopy prep.  If you use inhalers (even only as needed), please bring them with you on the day of your procedure. Your physician has requested that you go to www.startemmi.com and enter the access code given to you at your visit today. This web site gives a general overview about your procedure. However, you should still follow specific instructions given to you by our office regarding your preparation for the procedure.  We sent a prescription for Anamantle cream to [facility] [address], [address] of [place].
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
## 2014-10-14 NOTE — Telephone Encounter (Signed)
The pharmacist Robin from [facility] called to say the Anamantel is not covered. The cost would be $140.00 and she called the patient and he thought that was too much. I asked her if we could split it up and asked how much 2 % lidocaine would be and she said $4.00,. I asked her how much Proctosol cream or Pramoxine cream 2.5 % would be for a 30 gram tube. She said $ 67.99.  I told her to fill these two prescriptions for the patient . I called the patient to advise and his wife said that would be ok.  At least it was 1/2 of what the Anamantel was.

## 2014-10-15 NOTE — Progress Notes (Signed)
Reviewed and agree with management plan.  Delilah Mulgrew T. Blake Goya, MD FACG 

## 2014-10-20 ENCOUNTER — Ambulatory Visit (AMBULATORY_SURGERY_CENTER): Payer: PPO | Admitting: Gastroenterology

## 2014-10-20 ENCOUNTER — Encounter: Payer: Self-pay | Admitting: Gastroenterology

## 2014-10-20 VITALS — BP 136/58 | HR 62 | Temp 95.8°F | Resp 19 | Ht 68.0 in | Wt 171.0 lb

## 2014-10-20 DIAGNOSIS — R194 Change in bowel habit: Secondary | ICD-10-CM | POA: Diagnosis present

## 2014-10-20 DIAGNOSIS — K625 Hemorrhage of anus and rectum: Secondary | ICD-10-CM

## 2014-10-20 DIAGNOSIS — D123 Benign neoplasm of transverse colon: Secondary | ICD-10-CM | POA: Diagnosis not present

## 2014-10-20 DIAGNOSIS — K921 Melena: Secondary | ICD-10-CM | POA: Diagnosis not present

## 2014-10-20 DIAGNOSIS — D120 Benign neoplasm of cecum: Secondary | ICD-10-CM

## 2014-10-20 LAB — GLUCOSE, CAPILLARY
Glucose-Capillary: 68 mg/dL — ABNORMAL LOW (ref 70–99)
Glucose-Capillary: 208 mg/dL — ABNORMAL HIGH (ref 70–99)

## 2014-10-20 MED ORDER — SODIUM CHLORIDE 0.9 % IV SOLN: 500.0000 mL | INTRAVENOUS | Status: DC

## 2014-10-20 NOTE — Op Note (Signed)
[place]  [address] [place], [address]   COLONOSCOPY PROCEDURE REPORT  PATIENT: Calvin Burnett, Calvin Burnett  MR#: [id number] BIRTHDATE: 1933-07-24 , 80  yrs. old GENDER: male ENDOSCOPIST: Ladene Artist, MD, [facility] PROCEDURE DATE:  10/20/2014 PROCEDURE:   Colonoscopy, diagnostic, Colonoscopy with biopsy, and Colonoscopy with snare polypectomy First Screening Colonoscopy - Avg.  risk and is 50 yrs.  old or older - No.  Prior Negative Screening - Now for repeat screening. N/A  History of Adenoma - Now for follow-up colonoscopy & has been > or = to 3 yrs.  N/A ASA CLASS:   Class III INDICATIONS:Evaluation of unexplained GI bleeding and Colorectal Neoplasm Risk Assessment for this procedure is average risk. MEDICATIONS: Monitored anesthesia care and Propofol 200 mg IV DESCRIPTION OF PROCEDURE:   After the risks benefits and alternatives of the procedure were thoroughly explained, informed consent was obtained.  The digital rectal exam revealed no abnormalities of the rectum.   The LB [id number]  endoscope was introduced through the anus and advanced to the cecum, which was identified by both the appendix and ileocecal valve. No adverse events experienced.   The quality of the prep was adequate (MoviPrep was used)  The instrument was then slowly withdrawn as the colon was fully examined.  COLON FINDINGS: Two sessile polyps ranging between 5-9 mm in size were found at the cecum. A polypectomy was performed using cold snare and snare cautery respectively. The resection was complete, the polyp tissue was completely retrieved and sent to histology. There was mild diverticulosis noted in the sigmoid colon. Thickened, erythematous fold in the mid transverse colon, biopsied. A near circumferential patch of abnormal mucosa was found in the rectum. The mucosa was erythematous, friable and edematous.  50-75% of the surface was affected. Multiple biopsies of the area  were performed. The examination was otherwise normal. Retroflexed views revealed internal Grade I hemorrhoids. The time to cecum = 1.8 Withdrawal time = 13.8  The scope was withdrawn and the procedure completed. COMPLICATIONS: There were no immediate complications. ENDOSCOPIC IMPRESSION: 1.   Two sessile polyps at the cecum; polypectomy performed using cold snare and snare cautery 2.   Mild diverticulosis in the sigmoid colon 3.   Thickened transverse colon fold; biopsied 4.   Abnormal mucosa in the rectum; multiple biopsies performed 5.   Grade l internal hemorrhoids  RECOMMENDATIONS: 1.  Hold Aspirin and all other NSAIDS for 2 weeks. 2.  Await pathology results 3.  High fiber diet with liberal fluid intake. 4.  Outpatient follow-up in 4 weeks.  eSigned:  Ladene Artist, MD, [facility] 10/20/2014 11:40 AM

## 2014-10-20 NOTE — Progress Notes (Signed)
Called to room to assist during endoscopic procedure.  Patient ID and intended procedure confirmed with present staff. Received instructions for my participation in the procedure from the performing physician.  

## 2014-10-20 NOTE — Patient Instructions (Signed)
YOU HAD AN ENDOSCOPIC PROCEDURE TODAY AT [facility]:   Refer to the procedure report that was given to you for any specific questions about what was found during the examination.  If the procedure report does not answer your questions, please call your gastroenterologist to clarify.  If you requested that your care partner not be given the details of your procedure findings, then the procedure report has been included in a sealed envelope for you to review at your convenience later.  YOU SHOULD EXPECT: Some feelings of bloating in the abdomen. Passage of more gas than usual.  Walking can help get rid of the air that was put into your GI tract during the procedure and reduce the bloating. If you had a lower endoscopy (such as a colonoscopy or flexible sigmoidoscopy) you may notice spotting of blood in your stool or on the toilet paper. If you underwent a bowel prep for your procedure, you may not have a normal bowel movement for a few days.  Please Note:  You might notice some irritation and congestion in your nose or some drainage.  This is from the oxygen used during your procedure.  There is no need for concern and it should clear up in a day or so.  SYMPTOMS TO REPORT IMMEDIATELY:   Following lower endoscopy (colonoscopy or flexible sigmoidoscopy):  Excessive amounts of blood in the stool  Significant tenderness or worsening of abdominal pains  Swelling of the abdomen that is new, acute  Fever of 100F or higher   For urgent or emergent issues, a gastroenterologist can be reached at any hour by calling [phone].  HOLD ASPIRIN AND ALL NSAIDS FOR TWO WEEKS PER DR. STARK TO DECREASE THE RISK OF BLEEDING. READ ALL HANDOUTS GIVEN TO YOU BY YOUR RECOVERY ROOM NURSE. DIET: Your first meal following the procedure should be a small meal and then it is ok to progress to your normal diet. Heavy or fried foods are harder to digest and may make you feel nauseous or bloated.   Likewise, meals heavy in dairy and vegetables can increase bloating.  Drink plenty of fluids but you should avoid alcoholic beverages for 24 hours.  ACTIVITY:  You should plan to take it easy for the rest of today and you should NOT DRIVE or use heavy machinery until tomorrow (because of the sedation medicines used during the test).    FOLLOW UP: Our staff will call the number listed on your records the next business day following your procedure to check on you and address any questions or concerns that you may have regarding the information given to you following your procedure. If we do not reach you, we will leave a message.  However, if you are feeling well and you are not experiencing any problems, there is no need to return our call.  We will assume that you have returned to your regular daily activities without incident.  If any biopsies were taken you will be contacted by phone or by letter within the next 1-3 weeks.  Please call us at [phone] if you have not heard about the biopsies in 3 weeks.    SIGNATURES/CONFIDENTIALITY: You and/or your care partner have signed paperwork which will be entered into your electronic medical record.  These signatures attest to the fact that that the information above on your After Visit Summary has been reviewed and is understood.  Full responsibility of the confidentiality of this discharge information lies with you and/or  your care-partner. 

## 2014-10-20 NOTE — Progress Notes (Signed)
Report to PACU, RN, vss, BBS= Clear.  

## 2014-10-21 ENCOUNTER — Telehealth: Payer: Self-pay | Admitting: *Deleted

## 2014-10-21 LAB — URINE CULTURE
COLONY COUNT: NO GROWTH
Organism ID, Bacteria: NO GROWTH

## 2014-10-21 NOTE — Telephone Encounter (Signed)
No answer, number identifier. Message left to call if any questions or concers.

## 2014-10-25 ENCOUNTER — Encounter: Payer: Self-pay | Admitting: Gastroenterology
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
## 2014-10-27 ENCOUNTER — Encounter: Payer: Self-pay | Admitting: Family Medicine

## 2014-10-27 DIAGNOSIS — Z86010 Personal history of colonic polyps: Secondary | ICD-10-CM | POA: Insufficient documentation

## 2014-10-28 ENCOUNTER — Encounter: Payer: Self-pay | Admitting: Cardiology

## 2014-10-28 ENCOUNTER — Ambulatory Visit (INDEPENDENT_AMBULATORY_CARE_PROVIDER_SITE_OTHER): Payer: PPO | Admitting: Cardiology

## 2014-10-28 VITALS — BP 118/68 | HR 64 | Wt 168.8 lb

## 2014-10-28 DIAGNOSIS — E785 Hyperlipidemia, unspecified: Secondary | ICD-10-CM | POA: Diagnosis not present

## 2014-10-28 DIAGNOSIS — I10 Essential (primary) hypertension: Secondary | ICD-10-CM

## 2014-10-28 DIAGNOSIS — R55 Syncope and collapse: Secondary | ICD-10-CM

## 2014-10-28 DIAGNOSIS — R001 Bradycardia, unspecified: Secondary | ICD-10-CM | POA: Diagnosis not present

## 2014-10-28 NOTE — Assessment & Plan Note (Signed)
Management per primary care. 

## 2014-10-28 NOTE — Assessment & Plan Note (Signed)
As stated under bradycardia it is not clear patient had frank syncope. LV function is normal. No further workup unless he has episodes in the future.

## 2014-10-28 NOTE — Assessment & Plan Note (Signed)
Patient had sinus bradycardia in the hospital. I do not think he had frank syncope as he never lost consciousness completely. His episode was felt secondary to dehydration. I will not pursue further workup at this point. If he has more frequent episodes in the future we could consider a monitor. LV function was normal.

## 2014-10-28 NOTE — Patient Instructions (Signed)
Your physician wants you to follow-up in: 6 MONTHS WITH DR CRENSHAW You will receive a reminder letter in the mail two months in advance. If you don't receive a letter, please call our office to schedule the follow-up appointment.  

## 2014-10-28 NOTE — Assessment & Plan Note (Signed)
Blood pressure controlled. Continue present medications. 

## 2014-11-03 ENCOUNTER — Ambulatory Visit: Payer: PPO | Admitting: Oncology

## 2014-11-03 ENCOUNTER — Ambulatory Visit: Payer: PPO

## 2014-11-03 ENCOUNTER — Other Ambulatory Visit: Payer: PPO

## 2014-11-04 ENCOUNTER — Encounter: Payer: Self-pay | Admitting: Family Medicine

## 2014-11-04 ENCOUNTER — Ambulatory Visit (INDEPENDENT_AMBULATORY_CARE_PROVIDER_SITE_OTHER): Payer: PPO | Admitting: Family Medicine

## 2014-11-04 VITALS — BP 122/72 | HR 54 | Temp 97.8°F | Wt 170.0 lb

## 2014-11-04 DIAGNOSIS — K6289 Other specified diseases of anus and rectum: Secondary | ICD-10-CM

## 2014-11-04 NOTE — Progress Notes (Signed)
We sent him a letter about his pathology report about 10 days ago. At the time of his colonoscopy I asked him to see me (or he can see one of our APPs) in my office in 4 weeks. He should keep the follow up appt as recommended. I am not clear on the cause of his rectal pain.  Proctitis and hemorrhoids did not seem significant enough to cause his symptoms. GU, neuropathic, musculoskeletal causes should be looked at.

## 2014-11-04 NOTE — Progress Notes (Signed)
[name], MD Phone: [phone]  Subjective:   Calvin Burnett is a 81 y.o. year old very pleasant male patient who presents with the following:  Rectal Pain-continued issue -no longer having abdominal pain. Continues to have pinky finger sized stools going twice a day. A lot of gas. Burning in rectum when he stools is probably the main issue.  Using lidocaine-hcrocortisone cream but burns despite- about 25% better. Hyoscyamine per urology also helps some.   Had colonoscopy showing adenomas and also below findings "3. Surgical [P], rectum biopsy - FOCAL ACTIVE COLITIS WITH REACTIVE / REGENERATIVE CHANGES. - NO DYSPLASIA OR MALIGNANCY IDENTIFIED. - SEE COMMENT. Microscopic Comment 3. Sections from the rectal biopsy demonstrates benign colorectal mucosa showing focal active colitis and reactive / regenerative changes including increased basement membrane thickness. The differential with this histologic picture includes stercoral injury, ischemic injury, and less likely an infectious etiology. Please correlate with clinical and endoscopic impression. Dr. Donato Heinz has seen the third specimen in consultation with agreement."   ROS- no unintentional weight loss, fever, night sweats, fatigue.   Past Medical History- Patient Active Problem List   Diagnosis Date Noted  . Rectal pain 10/14/2014    Priority: High  . Prostate cancer 08/12/2012    Priority: High  . Diabetes mellitus type II, controlled 07/31/2010    Priority: High  . Syncope 09/27/2014    Priority: Medium  . CKD (chronic kidney disease), stage III 06/01/2014    Priority: Medium  . Dyslipidemia 03/01/2014    Priority: Medium  . Basal cell carcinoma 05/03/2009    Priority: Medium  . Memory loss 07/27/2008    Priority: Medium  . Essential hypertension 01/30/2007    Priority: Medium  . History of adenomatous polyp of colon 10/27/2014    Priority: Low  . Bowel habit changes 10/14/2014    Priority: Low  .  Bradycardia 09/27/2014    Priority: Low  . LEG CRAMPS, NOCTURNAL 04/30/2010    Priority: Low  . Cervical spondylosis without myelopathy 12/11/2007    Priority: Low  . GERD 11/05/2007    Priority: Low   Medications- reviewed and updated Current Outpatient Prescriptions  Medication Sig Dispense Refill  . glipiZIDE (GLUCOTROL XL) 5 MG 24 hr tablet Take 1 tablet (5 mg total) by mouth daily. 30 tablet 5  . hyoscyamine (ANASPAZ) 0.125 MG TBDP disintergrating tablet Take 1 tablet by mouth every 8 (eight) hours as needed.    . ramipril (ALTACE) 5 MG capsule Take 1 capsule by mouth daily.    . ranitidine (ZANTAC) 150 MG tablet Take 1 tablet (150 mg total) by mouth 2 (two) times daily. 60 tablet 5  . sertraline (ZOLOFT) 50 MG tablet Take 1 tablet (50 mg total) by mouth daily. 30 tablet 5  . lidocaine-hydrocortisone (ANAMANTEL HC) 3-0.5 % CREA Place 1 Applicatorful rectally 2 (two) times daily. (Patient not taking: Reported on 11/04/2014) 28.3 g 1   No current facility-administered medications for this visit.   Objective: BP 122/72 mmHg  Pulse 54  Temp(Src) 97.8 F (36.6 C)  Wt 170 lb (77.111 kg) Gen: NAD, resting comfortably CV: RRR no murmurs rubs or gallops Lungs: CTAB no crackles, wheeze, rhonchi Abdomen: soft/nontender/nondistended/normal bowel sounds. No rebound or guarding.  Ext: no edema Skin: warm, dry, no rash   Assessment/Plan:  Rectal pain Continued pain with stooling described as burning. Recent Colonoscopy showed focal active colitis in the rectum with suggestion stercoral, ischemic, less likely infectious. No mention of it being related to radiation.  i would wonder if radioactive seeds in prostate could cause such issues. Will reach out to Dr. Fuller Plan to ask for further recommendations. Continue prn lidocaine and hyoscyamine which help some. May need to get patient back to urology.

## 2014-11-04 NOTE — Assessment & Plan Note (Addendum)
Continued pain with stooling described as burning. Recent Colonoscopy showed focal active colitis in the rectum with suggestion stercoral, ischemic, less likely infectious. No mention of it being related to radiation. i would wonder if radioactive seeds in prostate could cause such issues. Will reach out to Dr. Fuller Plan to ask for further recommendations. Continue prn lidocaine and hyoscyamine which help some. May need to get patient back to urology.

## 2014-11-04 NOTE — Patient Instructions (Addendum)
Have copy of eye exam sent to [place] at [phone].  Continue off of ramipril-took off your list.   I am going to reach out to Dr. Fuller Plan to see if there is anything else we need to do regarding inflammation in your colon.   Check back with [place] in 2 weeks if you have not heard anything

## 2014-11-07 NOTE — Progress Notes (Signed)
Pt notified and verbalized that he will contact Dr. Fuller Plan office.

## 2014-11-11 ENCOUNTER — Ambulatory Visit: Payer: Self-pay | Admitting: Gastroenterology

## 2015-01-04 ENCOUNTER — Ambulatory Visit: Payer: PPO | Admitting: Family Medicine

## 2015-01-23 ENCOUNTER — Other Ambulatory Visit: Payer: Self-pay | Admitting: Family Medicine

## 2015-01-24 ENCOUNTER — Other Ambulatory Visit: Payer: Self-pay | Admitting: Family Medicine

## 2015-02-07 ENCOUNTER — Other Ambulatory Visit: Payer: Self-pay | Admitting: Family Medicine

## 2015-02-10 ENCOUNTER — Encounter: Payer: Self-pay | Admitting: Family Medicine

## 2015-02-12 ENCOUNTER — Other Ambulatory Visit: Payer: Self-pay | Admitting: Family Medicine

## 2015-02-18 ENCOUNTER — Other Ambulatory Visit: Payer: Self-pay | Admitting: Family Medicine

## 2015-04-27 ENCOUNTER — Other Ambulatory Visit: Payer: Self-pay | Admitting: Family Medicine

## 2015-05-23 ENCOUNTER — Ambulatory Visit (INDEPENDENT_AMBULATORY_CARE_PROVIDER_SITE_OTHER): Payer: PPO

## 2015-05-23 DIAGNOSIS — Z23 Encounter for immunization: Secondary | ICD-10-CM | POA: Diagnosis not present

## 2015-05-30 ENCOUNTER — Ambulatory Visit (INDEPENDENT_AMBULATORY_CARE_PROVIDER_SITE_OTHER): Payer: PPO | Admitting: Family Medicine

## 2015-05-30 DIAGNOSIS — Z23 Encounter for immunization: Secondary | ICD-10-CM

## 2015-08-02 DIAGNOSIS — H5712 Ocular pain, left eye: Secondary | ICD-10-CM | POA: Diagnosis not present

## 2015-08-02 DIAGNOSIS — H16143 Punctate keratitis, bilateral: Secondary | ICD-10-CM | POA: Diagnosis not present

## 2015-08-05 ENCOUNTER — Other Ambulatory Visit: Payer: Self-pay | Admitting: Family Medicine

## 2015-08-10 ENCOUNTER — Ambulatory Visit (INDEPENDENT_AMBULATORY_CARE_PROVIDER_SITE_OTHER): Payer: PPO | Admitting: Family Medicine

## 2015-08-10 ENCOUNTER — Encounter: Payer: Self-pay | Admitting: Family Medicine

## 2015-08-10 VITALS — BP 130/80 | HR 57 | Temp 98.4°F | Wt 173.0 lb

## 2015-08-10 DIAGNOSIS — I10 Essential (primary) hypertension: Secondary | ICD-10-CM

## 2015-08-10 DIAGNOSIS — E119 Type 2 diabetes mellitus without complications: Secondary | ICD-10-CM

## 2015-08-10 DIAGNOSIS — N183 Chronic kidney disease, stage 3 unspecified: Secondary | ICD-10-CM

## 2015-08-10 DIAGNOSIS — R413 Other amnesia: Secondary | ICD-10-CM

## 2015-08-10 LAB — HEMOGLOBIN A1C: Hgb A1c MFr Bld: 5.8 % (ref 4.6–6.5)

## 2015-08-10 LAB — CBC
HCT: 44.3 % (ref 39.0–52.0)
Hemoglobin: 14.8 g/dL (ref 13.0–17.0)
MCHC: 33.5 g/dL (ref 30.0–36.0)
MCV: 93.8 fl (ref 78.0–100.0)
PLATELETS: 202 10*3/uL (ref 150.0–400.0)
RBC: 4.72 Mil/uL (ref 4.22–5.81)
RDW: 13.8 % (ref 11.5–15.5)
WBC: 6.5 10*3/uL (ref 4.0–10.5)

## 2015-08-10 LAB — COMPREHENSIVE METABOLIC PANEL
ALT: 15 U/L (ref 0–53)
AST: 19 U/L (ref 0–37)
Albumin: 4.2 g/dL (ref 3.5–5.2)
Alkaline Phosphatase: 83 U/L (ref 39–117)
BILIRUBIN TOTAL: 0.5 mg/dL (ref 0.2–1.2)
BUN: 22 mg/dL (ref 6–23)
CALCIUM: 9.3 mg/dL (ref 8.4–10.5)
CO2: 30 meq/L (ref 19–32)
CREATININE: 1.32 mg/dL (ref 0.40–1.50)
Chloride: 105 mEq/L (ref 96–112)
GFR: 55.51 mL/min — ABNORMAL LOW (ref >60.00)
Glucose, Bld: 137 mg/dL — ABNORMAL HIGH (ref 70–99)
Potassium: 4.4 mEq/L (ref 3.5–5.1)
Sodium: 141 mEq/L (ref 135–145)
Total Protein: 7.1 g/dL (ref 6.0–8.3)

## 2015-08-10 LAB — MICROALBUMIN / CREATININE URINE RATIO
CREATININE, U: 199.4 mg/dL
Microalb Creat Ratio: 14 mg/g (ref 0.0–30.0)
Microalb, Ur: 27.9 mg/dL — ABNORMAL HIGH (ref 0.0–1.9)

## 2015-08-10 MED ORDER — GLUCOSE BLOOD VI STRP: ORAL_STRIP | Status: DC

## 2015-08-10 NOTE — Progress Notes (Signed)
[name], MD  Subjective:  Calvin Burnett is a 82 y.o. year old very pleasant male patient who presents for/with See problem oriented charting ROS- had some abdominal pain now resolved. No diarrhea or constipation. Denies hypoglycemia. No chest pain or shortness of breath. No headache or blurry vision.   Past Medical History-  Patient Active Problem List   Diagnosis Date Noted  . Rectal pain 10/14/2014    Priority: High  . Prostate cancer ([place]) 08/12/2012    Priority: High  . Diabetes mellitus type II, controlled ([place]) 07/31/2010    Priority: High  . Syncope 09/27/2014    Priority: Medium  . CKD (chronic kidney disease), stage III 06/01/2014    Priority: Medium  . Dyslipidemia 03/01/2014    Priority: Medium  . Basal cell carcinoma 05/03/2009    Priority: Medium  . Memory loss 07/27/2008    Priority: Medium  . Essential hypertension 01/30/2007    Priority: Medium  . History of adenomatous polyp of colon 10/27/2014    Priority: Low  . Bowel habit changes 10/14/2014    Priority: Low  . Bradycardia 09/27/2014    Priority: Low  . LEG CRAMPS, NOCTURNAL 04/30/2010    Priority: Low  . Cervical spondylosis without myelopathy 12/11/2007    Priority: Low  . GERD 11/05/2007    Priority: Low    Medications- reviewed and updated Current Outpatient Prescriptions  Medication Sig Dispense Refill  . glipiZIDE (GLUCOTROL XL) 5 MG 24 hr tablet TAKE 1 TABLET BY MOUTH EVERY DAY 30 tablet 5  . ranitidine (ZANTAC) 150 MG tablet TAKE 1 TABLET (150 MG TOTAL) BY MOUTH 2 (TWO) TIMES DAILY. 60 tablet 5  . sertraline (ZOLOFT) 50 MG tablet TAKE 1 TABLET BY MOUTH ONCE DAILY 30 tablet 5  . glucose blood (ACCU-CHEK ACTIVE STRIPS) test strip Use to check blood sugars daily.Dx: E11.9 100 each 12   No current facility-administered medications for this visit.    Objective: BP 130/80 mmHg  Pulse 57  Temp(Src) 98.4 F (36.9 C)  Wt 173 lb (78.472 kg) Gen: NAD, resting comfortably CV: RRR no  murmurs rubs or gallops Lungs: CTAB no crackles, wheeze, rhonchi Abdomen: soft/nontender/nondistended/normal bowel sounds. No rebound or guarding.  Ext: no edema Skin: warm, dry Neuro: grossly normal, moves all extremities  Assessment/Plan:  Diabetes mellitus type II, controlled ([place]) S: well controlled in past on glipizide 5mg  XL.  CBGs- lost meter Lab Results  Component Value Date   HGBA1C 6.7* 06/01/2014   HGBA1C 6.9* 12/16/2013   HGBA1C 7.1* 07/02/2013    A/P: repeat a1c today, hopeful 7 or less. Sent for eye exam. Foot exam normal today   CKD (chronic kidney disease), stage III S:GFR 50-60 in the past. CKD could be due to diabetes. Stopped ramipril for unclear reasons. BP fortunately controlled without.  A/P: check microalbumin/cr ratio as has been off ramipril. If GFR remains 50-60 or if microalb/cr ratio >30 then will restart ramipril at 5mg .    Essential hypertension S: controlled previously with ramipril 5mg . Has stopped this and fortunately Bp still at goal <140/90 BP Readings from Last 3 Encounters:  08/10/15 130/80  11/04/14 122/72  10/28/14 118/68  A/P:Continue without medicine at present. I suspect I will have to restart ramipril 5mg     Memory loss S: Patient declines worsening in his memory. MMSE 26/30 today (lost 1 for date and 3 for world backwards).  Over a year ago was 27/30 on namenda- he has stopped taking that A/P: we will  continue to trend MMSE every 6-12 months. If progresses to 24 would consider start aricept after reversible causes of dementia workup including bloodwork and ? MRI though no stepwise obvious decline so may not be beneficial (doubt CVA)    Return in about 6 months (around 02/07/2016) for follow up- or sooner if needed. Return precautions advised.   nonfasting Orders Placed This Encounter  Procedures  . CBC    [place]  . Comprehensive metabolic panel    [place]  . Microalbumin / creatinine urine ratio    [place]  . Hemoglobin  A1c    [place]  . Ambulatory referral to Ophthalmology    Referral Priority:  Routine    Referral Type:  Consultation    Referral Reason:  Specialty Services Required    Requested Specialty:  Ophthalmology    Number of Visits Requested:  1   New accu chek meter given Meds ordered this encounter  Medications  . glucose blood (ACCU-CHEK ACTIVE STRIPS) test strip    Sig: Use to check blood sugars daily.Dx: E11.9    Dispense:  100 each    Refill:  12

## 2015-08-10 NOTE — Patient Instructions (Addendum)
Make sure to follow up with urology  Labs before you leave  Labs before you leave  No changes unless labs lead [place] to make changes or lead [place] to see you sooner  New meter given and new strips sent in  Memory loss is stable- repeat test ever 6 months to year

## 2015-08-10 NOTE — Assessment & Plan Note (Signed)
S:GFR 50-60 in the past. CKD could be due to diabetes. Stopped ramipril for unclear reasons. BP fortunately controlled without.  A/P: check microalbumin/cr ratio as has been off ramipril. If GFR remains 50-60 or if microalb/cr ratio >30 then will restart ramipril at 5mg .

## 2015-08-10 NOTE — Assessment & Plan Note (Signed)
S: Patient declines worsening in his memory. MMSE 26/30 today (lost 1 for date and 3 for world backwards).  Over a year ago was 27/30 on namenda- he has stopped taking that A/P: we will continue to trend MMSE every 6-12 months. If progresses to 24 would consider start aricept after reversible causes of dementia workup including bloodwork and ? MRI though no stepwise obvious decline so may not be beneficial (doubt CVA)

## 2015-08-10 NOTE — Assessment & Plan Note (Signed)
S: controlled previously with ramipril 5mg . Has stopped this and fortunately Bp still at goal <140/90 BP Readings from Last 3 Encounters:  08/10/15 130/80  11/04/14 122/72  10/28/14 118/68  A/P:Continue without medicine at present. I suspect I will have to restart ramipril 5mg 

## 2015-08-10 NOTE — Assessment & Plan Note (Signed)
S: well controlled in past on glipizide 5mg  XL.  CBGs- lost meter Lab Results  Component Value Date   HGBA1C 6.7* 06/01/2014   HGBA1C 6.9* 12/16/2013   HGBA1C 7.1* 07/02/2013    A/P: repeat a1c today, hopeful 7 or less. Sent for eye exam. Foot exam normal today

## 2015-08-11 ENCOUNTER — Other Ambulatory Visit: Payer: Self-pay

## 2015-08-11 MED ORDER — RAMIPRIL 5 MG PO CAPS: 5.0000 mg | ORAL_CAPSULE | Freq: Every day | ORAL | Status: DC

## 2015-08-16 ENCOUNTER — Encounter: Payer: Self-pay | Admitting: Cardiovascular Disease

## 2015-08-21 DIAGNOSIS — H401132 Primary open-angle glaucoma, bilateral, moderate stage: Secondary | ICD-10-CM | POA: Diagnosis not present

## 2015-08-23 ENCOUNTER — Ambulatory Visit (INDEPENDENT_AMBULATORY_CARE_PROVIDER_SITE_OTHER): Payer: PPO | Admitting: Adult Health

## 2015-08-23 ENCOUNTER — Encounter: Payer: Self-pay | Admitting: Adult Health

## 2015-08-23 VITALS — BP 120/82 | Temp 97.8°F | Ht 68.0 in | Wt 173.8 lb

## 2015-08-23 DIAGNOSIS — R1084 Generalized abdominal pain: Secondary | ICD-10-CM

## 2015-08-23 MED ORDER — PANTOPRAZOLE SODIUM 40 MG PO TBEC: 40.0000 mg | DELAYED_RELEASE_TABLET | Freq: Every day | ORAL | Status: DC

## 2015-08-23 NOTE — Progress Notes (Signed)
Pre visit review using our clinic review tool, if applicable. No additional management support is needed unless otherwise documented below in the visit note. 
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
## 2015-08-23 NOTE — Patient Instructions (Signed)
It was great meeting you today!  I am sorry you are having so much difficulty with your stomach pain.   I have sent in a new prescription for a acid indigestion medication call Protonix. Take this instead of Zantac.   Someone will call you to schedule your ultrasound.   Follow up with Dr.Hunter as needed.

## 2015-09-11 ENCOUNTER — Ambulatory Visit
Admission: RE | Admit: 2015-09-11 | Discharge: 2015-09-11 | Disposition: A | Discharge status: Home / Self Care | Payer: PPO | Source: Ambulatory Visit | Attending: Adult Health | Admitting: Adult Health

## 2015-09-11 ENCOUNTER — Telehealth: Payer: Self-pay | Admitting: Adult Health

## 2015-09-11 DIAGNOSIS — R1084 Generalized abdominal pain: Secondary | ICD-10-CM

## 2015-09-11 DIAGNOSIS — R1010 Upper abdominal pain, unspecified: Secondary | ICD-10-CM | POA: Diagnosis not present

## 2015-09-11 NOTE — Telephone Encounter (Signed)
Left VM to call back regarding [place] results

## 2015-09-11 NOTE — Telephone Encounter (Signed)
Patient is calling back for the results of his stomach scan, please advise

## 2015-09-12 NOTE — Telephone Encounter (Signed)
Spoke to patient regarding Ultrasound results.  He endorses that his stomach discomfort is improving

## 2015-09-18 ENCOUNTER — Telehealth: Payer: Self-pay | Admitting: Family Medicine

## 2015-09-18 ENCOUNTER — Encounter: Payer: Self-pay | Admitting: Family Medicine

## 2015-09-18 ENCOUNTER — Ambulatory Visit (INDEPENDENT_AMBULATORY_CARE_PROVIDER_SITE_OTHER): Payer: PPO | Admitting: Family Medicine

## 2015-09-18 VITALS — BP 120/90 | HR 55 | Temp 97.9°F | Wt 176.0 lb

## 2015-09-18 DIAGNOSIS — R21 Rash and other nonspecific skin eruption: Secondary | ICD-10-CM | POA: Diagnosis not present

## 2015-09-18 DIAGNOSIS — L308 Other specified dermatitis: Secondary | ICD-10-CM

## 2015-09-18 MED ORDER — TRIAMCINOLONE ACETONIDE 0.1 % EX CREA: 1.0000 "application " | TOPICAL_CREAM | Freq: Two times a day (BID) | CUTANEOUS | Status: DC

## 2015-09-18 NOTE — Telephone Encounter (Signed)
See below

## 2015-09-18 NOTE — Progress Notes (Signed)
[name], MD  Subjective:  Calvin Burnett is a 82 y.o. year old very pleasant male patient who presents for/with See problem oriented charting ROS-not ill appearing, no fever/chills. No new medications. Not immunocompromised. No mucus membrane involvement.    Past Medical History-  Patient Active Problem List   Diagnosis Date Noted  . Rectal pain 10/14/2014    Priority: High  . Prostate cancer ([place]) 08/12/2012    Priority: High  . Diabetes mellitus type II, controlled ([place]) 07/31/2010    Priority: High  . Syncope 09/27/2014    Priority: Medium  . CKD (chronic kidney disease), stage III 06/01/2014    Priority: Medium  . Dyslipidemia 03/01/2014    Priority: Medium  . Basal cell carcinoma 05/03/2009    Priority: Medium  . Memory loss 07/27/2008    Priority: Medium  . Essential hypertension 01/30/2007    Priority: Medium  . History of adenomatous polyp of colon 10/27/2014    Priority: Low  . Bowel habit changes 10/14/2014    Priority: Low  . Bradycardia 09/27/2014    Priority: Low  . LEG CRAMPS, NOCTURNAL 04/30/2010    Priority: Low  . Cervical spondylosis without myelopathy 12/11/2007    Priority: Low  . GERD 11/05/2007    Priority: Low    Medications- reviewed and updated Current Outpatient Prescriptions  Medication Sig Dispense Refill  . glipiZIDE (GLUCOTROL XL) 5 MG 24 hr tablet TAKE 1 TABLET BY MOUTH EVERY DAY 30 tablet 5  . glucose blood (ACCU-CHEK ACTIVE STRIPS) test strip Use to check blood sugars daily.Dx: E11.9 100 each 12  . latanoprost (XALATAN) 0.005 % ophthalmic solution PLACE 1 DROP INTO BOTH EYES NIGHTLY.  12  . Meth-Hyo-M Bl-Na Phos-Ph Sal (URIBEL) 118 MG CAPS TAKE ONE CAPSULE BY MOUTH 3 TIMES A DAY AS NEEDED FOR DYSURIA  6  . pantoprazole (PROTONIX) 40 MG tablet Take 1 tablet (40 mg total) by mouth daily. 30 tablet 3  . ramipril (ALTACE) 5 MG capsule Take 1 capsule (5 mg total) by mouth daily. 90 capsule 3  . ranitidine (ZANTAC) 150 MG tablet  TAKE 1 TABLET (150 MG TOTAL) BY MOUTH 2 (TWO) TIMES DAILY. 60 tablet 5  . sertraline (ZOLOFT) 50 MG tablet TAKE 1 TABLET BY MOUTH ONCE DAILY 30 tablet 5  . triamcinolone cream (KENALOG) 0.1 % Apply 1 application topically 2 (two) times daily. For 7 days 80 g 0   No current facility-administered medications for this visit.    Objective: BP 120/90 mmHg  Pulse 55  Temp(Src) 97.9 F (36.6 C)  Wt 176 lb (79.833 kg) Gen: NAD, resting comfortably CV: RRR no murmurs rubs or gallops Lungs: CTAB no crackles, wheeze, rhonchi Ext: no edema Skin: warm, dry, very dry scaling skin on back of his neck. Several areas easily peeled off with minimal pain. Palpation causes som epain. No obvious excoriation Neuro: grossly normal, moves all extremities     Assessment/Plan:  Rash S: about 3 weeks ago got his hair cut and felt a pinch in his neck at one point. Felt like scissors may have had some rust. Within a few days noted some itching on his neck and progressed to severely dry cracked skin. Has never had this before. No change in detergents. aveeno Lotion provides some mild relief from the pain and the itching.  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 Treat with triamcinolone for 7 days. Return precautions advised. Go to fragrance free lotion. Try bathing every  other day instead of daily.   Meds ordered this encounter  Medications  . triamcinolone cream (KENALOG) 0.1 %    Sig: Apply 1 application topically 2 (two) times daily. For 7 days    Dispense:  80 g    Refill:  0

## 2015-09-18 NOTE — Telephone Encounter (Signed)
Pt has a breaking out on his neck and back. Itches very much , has had for 2 weeks and has gotten worse over the weekend. Would like to know if he can see you this am?

## 2015-09-18 NOTE — Telephone Encounter (Signed)
Pt has been scheduled.  °

## 2015-09-18 NOTE — Telephone Encounter (Signed)
Pt coming at 2:15.

## 2015-09-18 NOTE — Patient Instructions (Signed)
Rash concerning for - asteatotic eczema (see handout)  Use triamcinolone (steroid cream for 7 days)  Call me if worsens or does not improve within the 7 days

## 2015-10-09 DIAGNOSIS — R972 Elevated prostate specific antigen [PSA]: Secondary | ICD-10-CM | POA: Diagnosis not present

## 2015-10-14 ENCOUNTER — Other Ambulatory Visit: Payer: Self-pay | Admitting: Family Medicine

## 2015-10-16 DIAGNOSIS — C61 Malignant neoplasm of prostate: Secondary | ICD-10-CM | POA: Diagnosis not present

## 2015-10-16 DIAGNOSIS — Z0000 Encounter for general adult medical examination without abnormal findings: Secondary | ICD-10-CM | POA: Diagnosis not present

## 2015-10-16 DIAGNOSIS — N5201 Erectile dysfunction due to arterial insufficiency: Secondary | ICD-10-CM | POA: Diagnosis not present

## 2015-10-16 DIAGNOSIS — R300 Dysuria: Secondary | ICD-10-CM | POA: Diagnosis not present

## 2015-10-18 ENCOUNTER — Ambulatory Visit (INDEPENDENT_AMBULATORY_CARE_PROVIDER_SITE_OTHER): Payer: PPO | Admitting: Family Medicine

## 2015-10-18 ENCOUNTER — Encounter: Payer: Self-pay | Admitting: Family Medicine

## 2015-10-18 VITALS — BP 122/82 | HR 88 | Temp 99.4°F | Wt 168.7 lb

## 2015-10-18 DIAGNOSIS — J069 Acute upper respiratory infection, unspecified: Secondary | ICD-10-CM

## 2015-10-18 DIAGNOSIS — R059 Cough, unspecified: Secondary | ICD-10-CM

## 2015-10-18 DIAGNOSIS — R05 Cough: Secondary | ICD-10-CM

## 2015-10-18 DIAGNOSIS — J029 Acute pharyngitis, unspecified: Secondary | ICD-10-CM | POA: Diagnosis not present

## 2015-10-18 MED ORDER — AMOXICILLIN-POT CLAVULANATE 875-125 MG PO TABS: 1.0000 | ORAL_TABLET | Freq: Two times a day (BID) | ORAL | Status: DC

## 2015-10-18 MED ORDER — BENZONATATE 100 MG PO CAPS: 100.0000 mg | ORAL_CAPSULE | Freq: Three times a day (TID) | ORAL | Status: DC
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
## 2015-10-18 NOTE — Patient Instructions (Signed)
Get rest, drink plenty of fluids, and use tylenol as needed for pain and fever. Follow up if fever >101, if symptoms worsen or if symptoms are not improved in 3 days.  Mucinex DM for cough or benzonatate for cough that is not controlled by Mucinex DM. Fill prescription for antibiotic after 48-72 hours if symptoms have not improved.  Upper Respiratory Infection, Adult Most upper respiratory infections (URIs) are a viral infection of the air passages leading to the lungs. A URI affects the nose, throat, and upper air passages. The most common type of URI is nasopharyngitis and is typically referred to as "the common cold." URIs run their course and usually go away on their own. Most of the time, a URI does not require medical attention, but sometimes a bacterial infection in the upper airways can follow a viral infection. This is called a secondary infection. Sinus and middle ear infections are common types of secondary upper respiratory infections. Bacterial pneumonia can also complicate a URI. A URI can worsen asthma and chronic obstructive pulmonary disease (COPD). Sometimes, these complications can require emergency medical care and may be life threatening.  CAUSES Almost all URIs are caused by viruses. A virus is a type of germ and can spread from one person to another.  RISKS FACTORS You may be at risk for a URI if:   You smoke.   You have chronic heart or lung disease.  You have a weakened defense (immune) system.   You are very young or very old.   You have nasal allergies or asthma.  You work in crowded or poorly ventilated areas.  You work in health care facilities or schools. SIGNS AND SYMPTOMS  Symptoms typically develop 2-3 days after you come in contact with a cold virus. Most viral URIs last 7-10 days. However, viral URIs from the influenza virus (flu virus) can last 14-18 days and are typically more severe. Symptoms may include:   Runny or stuffy (congested) nose.    Sneezing.   Cough.   Sore throat.   Headache.   Fatigue.   Fever.   Loss of appetite.   Pain in your forehead, behind your eyes, and over your cheekbones (sinus pain).  Muscle aches.  DIAGNOSIS  Your health care provider may diagnose a URI by:  Physical exam.  Tests to check that your symptoms are not due to another condition such as:  Strep throat.  Sinusitis.  Pneumonia.  Asthma. TREATMENT  A URI goes away on its own with time. It cannot be cured with medicines, but medicines may be prescribed or recommended to relieve symptoms. Medicines may help:  Reduce your fever.  Reduce your cough.  Relieve nasal congestion. HOME CARE INSTRUCTIONS   Take medicines only as directed by your health care provider.   Gargle warm saltwater or take cough drops to comfort your throat as directed by your health care provider.  Use a warm mist humidifier or inhale steam from a shower to increase air moisture. This may make it easier to breathe.  Drink enough fluid to keep your urine clear or pale yellow.   Eat soups and other clear broths and maintain good nutrition.   Rest as needed.   Return to work when your temperature has returned to normal or as your health care provider advises. You may need to stay home longer to avoid infecting others. You can also use a face mask and careful hand washing to prevent spread of the virus.  Increase the  usage of your inhaler if you have asthma.   Do not use any tobacco products, including cigarettes, chewing tobacco, or electronic cigarettes. If you need help quitting, ask your health care provider. PREVENTION  The best way to protect yourself from getting a cold is to practice good hygiene.   Avoid oral or hand contact with people with cold symptoms.   Wash your hands often if contact occurs.  There is no clear evidence that vitamin C, vitamin E, echinacea, or exercise reduces the chance of developing a cold.  However, it is always recommended to get plenty of rest, exercise, and practice good nutrition.  SEEK MEDICAL CARE IF:   You are getting worse rather than better.   Your symptoms are not controlled by medicine.   You have chills.  You have worsening shortness of breath.  You have brown or red mucus.  You have yellow or brown nasal discharge.  You have pain in your face, especially when you bend forward.  You have a fever.  You have swollen neck glands.  You have pain while swallowing.  You have white areas in the back of your throat. SEEK IMMEDIATE MEDICAL CARE IF:   You have severe or persistent:  Headache.  Ear pain.  Sinus pain.  Chest pain.  You have chronic lung disease and any of the following:  Wheezing.  Prolonged cough.  Coughing up blood.  A change in your usual mucus.  You have a stiff neck.  You have changes in your:  Vision.  Hearing.  Thinking.  Mood. MAKE SURE YOU:   Understand these instructions.  Will watch your condition.  Will get help right away if you are not doing well or get worse.   This information is not intended to replace advice given to you by your health care provider. Make sure you discuss any questions you have with your health care provider.   Document Released: 12/25/2000 Document Revised: 11/15/2014 Document Reviewed: 10/06/2013 Elsevier Interactive Patient Education [employer].

## 2015-10-18 NOTE — Progress Notes (Signed)
Pre visit review using our clinic review tool, if applicable. No additional management support is needed unless otherwise documented below in the visit note. 

## 2015-11-13 ENCOUNTER — Ambulatory Visit (INDEPENDENT_AMBULATORY_CARE_PROVIDER_SITE_OTHER): Payer: PPO | Admitting: Family Medicine

## 2015-11-13 ENCOUNTER — Encounter: Payer: Self-pay | Admitting: Family Medicine

## 2015-11-13 VITALS — BP 130/80 | HR 56 | Temp 98.9°F | Wt 169.0 lb

## 2015-11-13 DIAGNOSIS — R21 Rash and other nonspecific skin eruption: Secondary | ICD-10-CM

## 2015-11-13 MED ORDER — CEPHALEXIN 500 MG PO CAPS: 500.0000 mg | ORAL_CAPSULE | Freq: Three times a day (TID) | ORAL | Status: DC

## 2015-11-13 MED ORDER — CLOTRIMAZOLE 1 % EX CREA: 1.0000 "application " | TOPICAL_CREAM | Freq: Two times a day (BID) | CUTANEOUS | Status: DC

## 2015-11-13 NOTE — Progress Notes (Signed)
Subjective:  Calvin Burnett is a 82 y.o. year old very pleasant male patient who presents for/with See problem oriented charting ROS- see ROS below  Past Medical History-  Patient Active Problem List   Diagnosis Date Noted  . Rectal pain 10/14/2014    Priority: High  . Prostate cancer ([place]) 08/12/2012    Priority: High  . Diabetes mellitus type II, controlled ([place]) 07/31/2010    Priority: High  . Syncope 09/27/2014    Priority: Medium  . CKD (chronic kidney disease), stage III 06/01/2014    Priority: Medium  . Dyslipidemia 03/01/2014    Priority: Medium  . Basal cell carcinoma 05/03/2009    Priority: Medium  . Memory loss 07/27/2008    Priority: Medium  . Essential hypertension 01/30/2007    Priority: Medium  . History of adenomatous polyp of colon 10/27/2014    Priority: Low  . Bowel habit changes 10/14/2014    Priority: Low  . Bradycardia 09/27/2014    Priority: Low  . LEG CRAMPS, NOCTURNAL 04/30/2010    Priority: Low  . Cervical spondylosis without myelopathy 12/11/2007    Priority: Low  . GERD 11/05/2007    Priority: Low    Medications- reviewed and updated Current Outpatient Prescriptions  Medication Sig Dispense Refill  . glucose blood (ACCU-CHEK ACTIVE STRIPS) test strip Use to check blood sugars daily.Dx: E11.9 100 each 12  . latanoprost (XALATAN) 0.005 % ophthalmic solution PLACE 1 DROP INTO BOTH EYES NIGHTLY.  12  . ramipril (ALTACE) 5 MG capsule Take 1 capsule (5 mg total) by mouth daily. 90 capsule 3  . ranitidine (ZANTAC) 150 MG tablet TAKE 1 TABLET (150 MG TOTAL) BY MOUTH 2 (TWO) TIMES DAILY. 60 tablet 5  . sertraline (ZOLOFT) 50 MG tablet TAKE 1 TABLET BY MOUTH ONCE DAILY 30 tablet 5  . Meth-Hyo-M Bl-Na Phos-Ph Sal (URIBEL) 118 MG CAPS Reported on 11/13/2015  6   No current facility-administered medications for this visit.    Objective: BP 130/80 mmHg  Pulse 56  Temp(Src) 98.9 F (37.2 C)  Wt 169 lb (76.658 kg) Gen: NAD, resting comfortably CV:  RRR no murmurs rubs or gallops Lungs: CTAB no crackles, wheeze, rhonchi Ext: no edema Skin: warm, dry Patient has a prominent red rash throughout his scrotum. Mild erythema on penile shaft but glans of penis also has a prominent red rash. Very mild tenderness to touch on the glans but no tenderness throughout his scrotum or testicles. Neuro: grossly normal, moves all extremities  Assessment/Plan:  Rash S: Patient states he was cleaning his toilet 2 days ago but thinks he sat down on the toilet before cleaning off the cleaning materials. He thinks this may have started this rash. It does not appear he had direct contact with that material and his scrotum or penis. He denies use of new lotion or fabric softeners or sheets or clothing with this region. Bilateral in distribution. Initially stated not itchy and then later stated mild itching. He did state that there was definitely some mild to moderate Pain yesterday. May also have some mild warmth. ROS-not ill appearing, no fever/chills. No new medications. Not immunocompromised. No mucus membrane involvement.  A/P: Unclear etiology. Will treat with Keflex in case cellulitis is involved. We'll also cover with Lotrimin  In case there is a fungal element although surprisingly not in intertriginous areas. Will have patient back before then with a week. Would consider prednisone or steroid cream. I am concerned this could be a contact dermatitis  but there did not appear to be any new contacts with the area.  Strict Return precautions advised. 2-3 day follow-up.  Meds ordered this encounter  Medications  . clotrimazole (LOTRIMIN) 1 % cream    Sig: Apply 1 application topically 2 (two) times daily.    Dispense:  30 g    Refill:  0  . cephALEXin (KEFLEX) 500 MG capsule    Sig: Take 1 capsule (500 mg total) by mouth 3 (three) times daily.    Dispense:  21 capsule    Refill:  0  New Acute issue with unclear prognosis.Mildly immunocompromised due to  diabetes.He is plugged in with urology so could consider urology consult as well  [name], MD

## 2015-11-13 NOTE — Patient Instructions (Signed)
Unclear cause of redness in your groin.   We are going to try to cover potential bacteria with keflex to take 3x a day for a week  Also treat with lotrimin cream in case there is a fungal element.   The only thing we are not covering is potential allergic reaction and if not improved, I may try some prednisone  See me back THursday or Friday for recheck or sooner if worsens or fever

## 2015-11-17 ENCOUNTER — Ambulatory Visit (INDEPENDENT_AMBULATORY_CARE_PROVIDER_SITE_OTHER): Payer: PPO | Admitting: Family Medicine

## 2015-11-17 ENCOUNTER — Encounter: Payer: Self-pay | Admitting: Family Medicine

## 2015-11-17 VITALS — BP 120/60 | HR 64 | Temp 98.0°F | Wt 166.0 lb

## 2015-11-17 DIAGNOSIS — R21 Rash and other nonspecific skin eruption: Secondary | ICD-10-CM | POA: Diagnosis not present

## 2015-11-17 NOTE — Progress Notes (Signed)
Subjective:  Calvin Burnett is a 82 y.o. year old very pleasant male patient who presents for/with See problem oriented charting ROS- see ROS below  Past Medical History-  Patient Active Problem List   Diagnosis Date Noted  . Rectal pain 10/14/2014    Priority: High  . Prostate cancer ([place]) 08/12/2012    Priority: High  . Diabetes mellitus type II, controlled ([place]) 07/31/2010    Priority: High  . Syncope 09/27/2014    Priority: Medium  . CKD (chronic kidney disease), stage III 06/01/2014    Priority: Medium  . Dyslipidemia 03/01/2014    Priority: Medium  . Basal cell carcinoma 05/03/2009    Priority: Medium  . Memory loss 07/27/2008    Priority: Medium  . Essential hypertension 01/30/2007    Priority: Medium  . History of adenomatous polyp of colon 10/27/2014    Priority: Low  . Bowel habit changes 10/14/2014    Priority: Low  . Bradycardia 09/27/2014    Priority: Low  . LEG CRAMPS, NOCTURNAL 04/30/2010    Priority: Low  . Cervical spondylosis without myelopathy 12/11/2007    Priority: Low  . GERD 11/05/2007    Priority: Low    Medications- reviewed and updated Current Outpatient Prescriptions  Medication Sig Dispense Refill  . glucose blood (ACCU-CHEK ACTIVE STRIPS) test strip Use to check blood sugars daily.Dx: E11.9 100 each 12  . latanoprost (XALATAN) 0.005 % ophthalmic solution PLACE 1 DROP INTO BOTH EYES NIGHTLY.  12  . ramipril (ALTACE) 5 MG capsule Take 1 capsule (5 mg total) by mouth daily. 90 capsule 3  . ranitidine (ZANTAC) 150 MG tablet TAKE 1 TABLET (150 MG TOTAL) BY MOUTH 2 (TWO) TIMES DAILY. 60 tablet 5  . sertraline (ZOLOFT) 50 MG tablet TAKE 1 TABLET BY MOUTH ONCE DAILY 30 tablet 5  . Meth-Hyo-M Bl-Na Phos-Ph Sal (URIBEL) 118 MG CAPS Reported on 11/13/2015  6   No current facility-administered medications for this visit.    Objective: BP 120/60 mmHg  Pulse 64  Temp(Src) 98 F (36.7 C)  Wt 166 lb (75.297 kg) Gen: NAD, resting comfortably CV:  RRR no murmurs rubs or gallops Lungs: CTAB no crackles, wheeze, rhonchi Ext: no edema Skin: warm, dry Patient has mild erythema on scrotum but no longer has this on glans penis or shaft. No tenderness to palpation.  Neuro: grossly normal, moves all extremities  Assessment/Plan:  Rash recheck S: Saw patient earlier this week for red somewhat painful and somewhat itchy are on entire scrotum an glans of penis. Patient now thinks it may have been a different fabric softener but no other areas of body were affected, previously thought it was a cleaning material he used on toilet.   We treated him with keflex and lotrimin with unclear etiology to cover for fungal or cellulitis though favored fungal. He has had drastic improvement after several layers of skin flaked off. Warmth is now resolved. No itching or pain at this point.   ROS-not ill appearing, no fever/chills. No new medications other than lotrimin and keflex. Not immunocompromised. No mucus membrane involvement.  A/P: Suspect this may have been fungal- will have him finish both keflex and lotrimin for a week. Hold off on steroid cream previously considered given improvement.   Return precautions advised if worsens or recurs after treatment  [name], MD

## 2015-11-17 NOTE — Patient Instructions (Signed)
Finish keflex and lotrimin. Verbally discussed return precautions- recurrence or worsening

## 2015-12-09 ENCOUNTER — Other Ambulatory Visit: Payer: Self-pay | Admitting: Adult Health

## 2015-12-12 NOTE — Telephone Encounter (Signed)
I do not see this medication on patient's current medication list. Ok to refill? 

## 2016-01-10 ENCOUNTER — Other Ambulatory Visit: Payer: Self-pay | Admitting: Family Medicine

## 2016-01-10 ENCOUNTER — Other Ambulatory Visit: Payer: Self-pay

## 2016-01-22 DIAGNOSIS — H401132 Primary open-angle glaucoma, bilateral, moderate stage: Secondary | ICD-10-CM | POA: Diagnosis not present

## 2016-02-05 ENCOUNTER — Encounter: Payer: Self-pay | Admitting: Family Medicine

## 2016-02-05 ENCOUNTER — Ambulatory Visit (INDEPENDENT_AMBULATORY_CARE_PROVIDER_SITE_OTHER): Payer: PPO | Admitting: Family Medicine

## 2016-02-05 VITALS — BP 140/88 | HR 48 | Temp 97.9°F | Wt 167.4 lb

## 2016-02-05 DIAGNOSIS — E119 Type 2 diabetes mellitus without complications: Secondary | ICD-10-CM | POA: Diagnosis not present

## 2016-02-05 DIAGNOSIS — N183 Chronic kidney disease, stage 3 unspecified: Secondary | ICD-10-CM

## 2016-02-05 DIAGNOSIS — I10 Essential (primary) hypertension: Secondary | ICD-10-CM

## 2016-02-05 DIAGNOSIS — I499 Cardiac arrhythmia, unspecified: Secondary | ICD-10-CM | POA: Diagnosis not present

## 2016-02-05 LAB — COMPREHENSIVE METABOLIC PANEL
ALT: 9 U/L (ref 0–53)
AST: 13 U/L (ref 0–37)
Albumin: 4 g/dL (ref 3.5–5.2)
Alkaline Phosphatase: 82 U/L (ref 39–117)
BUN: 17 mg/dL (ref 6–23)
CO2: 29 meq/L (ref 19–32)
Calcium: 9.1 mg/dL (ref 8.4–10.5)
Chloride: 103 mEq/L (ref 96–112)
Creatinine, Ser: 1.38 mg/dL (ref 0.40–1.50)
GFR: 52.67 mL/min — AB (ref >60.00)
GLUCOSE: 141 mg/dL — AB (ref 70–99)
POTASSIUM: 4.3 meq/L (ref 3.5–5.1)
Sodium: 139 mEq/L (ref 135–145)
Total Bilirubin: 0.5 mg/dL (ref 0.2–1.2)
Total Protein: 6.8 g/dL (ref 6.0–8.3)

## 2016-02-05 LAB — HEMOGLOBIN A1C: HEMOGLOBIN A1C: 6.5 % (ref 4.6–6.5)

## 2016-02-05 MED ORDER — ACCU-CHEK SOFT TOUCH LANCETS MISC: 12 refills | Status: DC

## 2016-02-05 MED ORDER — GLUCOSE BLOOD VI STRP: ORAL_STRIP | 12 refills | Status: DC

## 2016-02-05 NOTE — Progress Notes (Signed)
Subjective:  Calvin Burnett is a 82 y.o. year old very pleasant male patient who presents for/with See problem oriented charting ROS- No chest pain or shortness of breath. No headache or blurry vision. Does have some excess gas.see any ROS included in HPI as well.   Past Medical History-  Patient Active Problem List   Diagnosis Date Noted  . Rectal pain 10/14/2014    Priority: High  . Prostate cancer ([place]) 08/12/2012    Priority: High  . Diabetes mellitus type II, controlled ([place]) 07/31/2010    Priority: High  . Syncope 09/27/2014    Priority: Medium  . CKD (chronic kidney disease), stage III 06/01/2014    Priority: Medium  . Dyslipidemia 03/01/2014    Priority: Medium  . Basal cell carcinoma 05/03/2009    Priority: Medium  . Memory loss 07/27/2008    Priority: Medium  . Essential hypertension 01/30/2007    Priority: Medium  . History of adenomatous polyp of colon 10/27/2014    Priority: Low  . Bowel habit changes 10/14/2014    Priority: Low  . Bradycardia 09/27/2014    Priority: Low  . LEG CRAMPS, NOCTURNAL 04/30/2010    Priority: Low  . Cervical spondylosis without myelopathy 12/11/2007    Priority: Low  . GERD 11/05/2007    Priority: Low    Medications- reviewed and updated Current Outpatient Prescriptions  Medication Sig Dispense Refill  . clotrimazole (LOTRIMIN) 1 % cream Apply 1 application topically 2 (two) times daily. 30 g 0  . glucose blood (ACCU-CHEK ACTIVE STRIPS) test strip Use to check blood sugars daily.Dx: E11.9 100 each 12  . latanoprost (XALATAN) 0.005 % ophthalmic solution PLACE 1 DROP INTO BOTH EYES NIGHTLY.  12  . Meth-Hyo-M Bl-Na Phos-Ph Sal (URIBEL) 118 MG CAPS Reported on 11/13/2015  6  . pantoprazole (PROTONIX) 40 MG tablet TAKE 1 TABLET (40 MG TOTAL) BY MOUTH DAILY. 30 tablet 11  . ramipril (ALTACE) 5 MG capsule Take 1 capsule (5 mg total) by mouth daily. 90 capsule 3  . sertraline (ZOLOFT) 50 MG tablet TAKE 1 TABLET BY MOUTH ONCE DAILY 30  tablet 5   Objective: BP 140/88 (BP Location: Left Arm, Patient Position: Sitting, Cuff Size: Normal)   Pulse (!) 48   Temp 97.9 F (36.6 C) (Oral)   Wt 167 lb 6.4 oz (75.9 kg)   SpO2 96%   BMI 25.45 kg/m  Gen: NAD, resting comfortably CV: bradycardic- occasional ectopic beat no murmurs rubs or gallops Lungs: CTAB no crackles, wheeze, rhonchi Abdomen: soft/nontender/nondistended/normal bowel sounds.   Ext: no edema Skin: warm, dry Neuro: grossly normal, moves all extremities  EKg: Rate 51, sinus bradycarda, 1st degree block, normal axis, normal intervals except pr, no st or t wave changes except low voltage. Similar to 09/2014 EKG.   Assessment/Plan:  Health Maintenance Due  Topic Date Due  . ZOSTAVAX - declines 11/29/1995  . OPHTHALMOLOGY EXAM - refer again today 05/09/2011   Diabetes mellitus type II, controlled ([place]) S: well controlled. On glipizide 5mg  XL with improved a1c last visit. Stopped taking- unclear reason CBGs- needs strips/lancets Lab Results  Component Value Date   HGBA1C 5.8 08/10/2015   HGBA1C 6.7 (H) 06/01/2014   HGBA1C 6.9 (H) 12/16/2013   A/P: update a1c today- if above 6.5 restart glipizide 5mg  XL, otherwise remain off  Essential hypertension S: poorly controlled but just took ramipril immediately before visit BP Readings from Last 3 Encounters:  02/05/16 140/88  11/17/15 120/60  11/13/15 130/80  A/P:Continue current meds:  Recheck follow up   CKD (chronic kidney disease), stage III S: GFR stable in 50s previously A/P: update today  Cardiac arrhythmia, unspecified cardiac arrhythmia type - Plan: EKG 12-Lead S: on exam- appeared to have irregular beat A/P: EKG shows first degree block and sinus bradycardia with rate of 51- patient asymptomatic. Concern was a fib or pvcs but non detected.   Try gas-x for excess gas  Return in about 4 months (around 06/07/2016).  Orders Placed This Encounter  Procedures  . Hemoglobin A1c    [place]  .  Comprehensive metabolic panel    [place]  . Ambulatory referral to Ophthalmology    Referral Priority:   Routine    Referral Type:   Consultation    Referral Reason:   Specialty Services Required    Requested Specialty:   Ophthalmology    Number of Visits Requested:   1  . EKG 12-Lead    Order Specific Question:   Where should this test be performed    Answer:   Other    Meds ordered this encounter  Medications  . glucose blood (ACCU-CHEK ACTIVE STRIPS) test strip    Sig: Use to check blood sugars daily.Dx: E11.9    Dispense:  100 each    Refill:  12  . Lancets (ACCU-CHEK SOFT TOUCH) lancets    Sig: Use as instructed    Dispense:  100 each    Refill:  12    Return precautions advised.  [name], MD

## 2016-02-05 NOTE — Assessment & Plan Note (Signed)
S: well controlled. On glipizide 5mg  XL with improved a1c last visit. Stopped taking- unclear reason CBGs- needs strips/lancets Lab Results  Component Value Date   HGBA1C 5.8 08/10/2015   HGBA1C 6.7 (H) 06/01/2014   HGBA1C 6.9 (H) 12/16/2013   A/P: update a1c today- if above 6.5 restart glipizide 5mg  XL, otherwise remain off

## 2016-02-05 NOTE — Patient Instructions (Signed)
Labs before you go  No changes in medication  We will call you within a week about your referral to eye doctor. If you do not hear within 2 weeks, give [place] a call.

## 2016-02-05 NOTE — Progress Notes (Signed)
Pre visit review using our clinic review tool, if applicable. No additional management support is needed unless otherwise documented below in the visit note. 

## 2016-02-05 NOTE — Assessment & Plan Note (Signed)
S: poorly controlled but just took ramipril immediately before visit BP Readings from Last 3 Encounters:  02/05/16 140/88  11/17/15 120/60  11/13/15 130/80  A/P:Continue current meds:  Recheck follow up

## 2016-02-05 NOTE — Assessment & Plan Note (Signed)
S: GFR stable in 50s previously A/P: update today

## 2016-02-08 ENCOUNTER — Encounter: Payer: Self-pay | Admitting: Adult Health

## 2016-02-08 ENCOUNTER — Ambulatory Visit (INDEPENDENT_AMBULATORY_CARE_PROVIDER_SITE_OTHER): Payer: PPO | Admitting: Adult Health

## 2016-02-08 VITALS — BP 150/98 | Temp 98.2°F | Wt 168.0 lb

## 2016-02-08 DIAGNOSIS — B029 Zoster without complications: Secondary | ICD-10-CM

## 2016-02-08 MED ORDER — VALACYCLOVIR HCL 1 G PO TABS: 1000.0000 mg | ORAL_TABLET | Freq: Three times a day (TID) | ORAL | 0 refills | Status: DC

## 2016-02-08 MED ORDER — TRAMADOL HCL 50 MG PO TABS: 50.0000 mg | ORAL_TABLET | Freq: Three times a day (TID) | ORAL | 0 refills | Status: DC | PRN

## 2016-02-08 NOTE — Progress Notes (Signed)
   Subjective:    Patient ID: Calvin Burnett, male    DOB: July 11, 1934, 82 y.o.   MRN: [id number]  HPI  82 year old male who presents to the office for 2-3 days of a painful rash on his left side. He reports that the rash is along his belt line. He denies any discharge or weeping. He is unsure if he has had shingles in the past and has not had the vaccination   Review of Systems  Skin: Positive for color change and rash.  All other systems reviewed and are negative.      Objective:   Physical Exam  Constitutional: He is oriented to person, place, and time. He appears well-developed and well-nourished. No distress.  Neurological: He is alert and oriented to person, place, and time.  Skin: Skin is warm and dry. Rash (Vesicular rash that follows the T12 dermatome) noted. He is not diaphoretic.  Psychiatric: He has a normal mood and affect. His behavior is normal. Judgment and thought content normal.  Vitals reviewed.     Assessment & Plan:  1. Shingles outbreak - valACYclovir (VALTREX) 1000 MG tablet; Take 1 tablet (1,000 mg total) by mouth 3 (three) times daily.  Dispense: 21 tablet; Refill: 0 - traMADol (ULTRAM) 50 MG tablet; Take 1 tablet (50 mg total) by mouth every 8 (eight) hours as needed.  Dispense: 30 tablet; Refill: 0 - Can use Tylenol for mild pain   Dorothyann Peng, NP

## 2016-02-08 NOTE — Patient Instructions (Addendum)
It was great seeing you again!  Your exam is consistent with shingles.   I have sent in a medication called Valacyclovir, take one pill three times a day for 7 days.   You also have a prescription for a pain medication called Tramadol. This medication can make you sleepy. You can also take tylenol   Follow up if no improvement or any signs of infection    Shingles Shingles, which is also known as herpes zoster, is an infection that causes a painful skin rash and fluid-filled blisters. Shingles is not related to genital herpes, which is a sexually transmitted infection.   Shingles only develops in people who:  Have had chickenpox.  Have received the chickenpox vaccine. (This is rare.) CAUSES Shingles is caused by varicella-zoster virus (VZV). This is the same virus that causes chickenpox. After exposure to VZV, the virus stays in the body in an inactive (dormant) state. Shingles develops if the virus reactivates. This can happen many years after the initial exposure to VZV. It is not known what causes this virus to reactivate. RISK FACTORS People who have had chickenpox or received the chickenpox vaccine are at risk for shingles. Infection is more common in people who:  Are older than age 83.  Have a weakened defense (immune) system, such as those with HIV, AIDS, or cancer.  Are taking medicines that weaken the immune system, such as transplant medicines.  Are under great stress. SYMPTOMS Early symptoms of this condition include itching, tingling, and pain in an area on your skin. Pain may be described as burning, stabbing, or throbbing. A few days or weeks after symptoms start, a painful red rash appears, usually on one side of the body in a bandlike or beltlike pattern. The rash eventually turns into fluid-filled blisters that break open, scab over, and dry up in about 2-3 weeks. At any time during the infection, you may also develop:  A fever.  Chills.  A headache.  An  upset stomach. DIAGNOSIS This condition is diagnosed with a skin exam. Sometimes, skin or fluid samples are taken from the blisters before a diagnosis is made. These samples are examined under a microscope or sent to a lab for testing. TREATMENT There is no specific cure for this condition. Your health care provider will probably prescribe medicines to help you manage pain, recover more quickly, and avoid long-term problems. Medicines may include:  Antiviral drugs.  Anti-inflammatory drugs.  Pain medicines. If the area involved is on your face, you may be referred to a specialist, such as an eye doctor (ophthalmologist) or an ear, nose, and throat (ENT) doctor to help you avoid eye problems, chronic pain, or disability. HOME CARE INSTRUCTIONS Medicines  Take medicines only as directed by your health care provider.  Apply an anti-itch or numbing cream to the affected area as directed by your health care provider. Blister and Rash Care  Take a cool bath or apply cool compresses to the area of the rash or blisters as directed by your health care provider. This may help with pain and itching.  Keep your rash covered with a loose bandage (dressing). Wear loose-fitting clothing to help ease the pain of material rubbing against the rash.  Keep your rash and blisters clean with mild soap and cool water or as directed by your health care provider.  Check your rash every day for signs of infection. These include redness, swelling, and pain that lasts or increases.  Do not pick your blisters.  Do not scratch your rash. General Instructions  Rest as directed by your health care provider.  Keep all follow-up visits as directed by your health care provider. This is important.  Until your blisters scab over, your infection can cause chickenpox in people who have never had it or been vaccinated against it. To prevent this from happening, avoid contact with other people,  especially:  Babies.  Pregnant women.  Children who have eczema.  Elderly people who have transplants.  People who have chronic illnesses, such as leukemia or AIDS. SEEK MEDICAL CARE IF:  Your pain is not relieved with prescribed medicines.  Your pain does not get better after the rash heals.  Your rash looks infected. Signs of infection include redness, swelling, and pain that lasts or increases. SEEK IMMEDIATE MEDICAL CARE IF:  The rash is on your face or nose.  You have facial pain, pain around your eye area, or loss of feeling on one side of your face.  You have ear pain or you have ringing in your ear.  You have loss of taste.  Your condition gets worse.   This information is not intended to replace advice given to you by your health care provider. Make sure you discuss any questions you have with your health care provider.   Document Released: 07/01/2005 Document Revised: 07/22/2014 Document Reviewed: 05/12/2014 Elsevier Interactive Patient Education [employer].

## 2016-02-16 ENCOUNTER — Other Ambulatory Visit: Payer: Self-pay | Admitting: Adult Health

## 2016-02-16 DIAGNOSIS — B029 Zoster without complications: Secondary | ICD-10-CM

## 2016-02-19 NOTE — Telephone Encounter (Signed)
If he took it 3x a day for 7 days- no repeat is advised. Will usually have lingering pain and rash but happy to reevaluate if worsens or persistent past a month

## 2016-02-22 ENCOUNTER — Telehealth: Payer: Self-pay

## 2016-02-22 NOTE — Telephone Encounter (Signed)
Prior Authorization completed for valACYclovir (VALTREX) 1000 MG tablet  Key: [id number]

## 2016-02-27 NOTE — Telephone Encounter (Signed)
Spoke with patient who states he no longer feels he needs the medication as his rash has almost cleared up.

## 2016-03-30 ENCOUNTER — Other Ambulatory Visit: Payer: Self-pay | Admitting: Family Medicine

## 2016-04-18 ENCOUNTER — Ambulatory Visit (INDEPENDENT_AMBULATORY_CARE_PROVIDER_SITE_OTHER): Payer: PPO | Admitting: Family Medicine

## 2016-04-18 DIAGNOSIS — Z23 Encounter for immunization: Secondary | ICD-10-CM

## 2016-06-10 ENCOUNTER — Ambulatory Visit (INDEPENDENT_AMBULATORY_CARE_PROVIDER_SITE_OTHER): Payer: PPO | Admitting: Family Medicine

## 2016-06-10 ENCOUNTER — Encounter: Payer: Self-pay | Admitting: Family Medicine

## 2016-06-10 VITALS — BP 128/82 | HR 56 | Ht 68.0 in | Wt 162.2 lb

## 2016-06-10 DIAGNOSIS — I10 Essential (primary) hypertension: Secondary | ICD-10-CM

## 2016-06-10 DIAGNOSIS — R634 Abnormal weight loss: Secondary | ICD-10-CM | POA: Diagnosis not present

## 2016-06-10 DIAGNOSIS — E119 Type 2 diabetes mellitus without complications: Secondary | ICD-10-CM

## 2016-06-10 DIAGNOSIS — N183 Chronic kidney disease, stage 3 unspecified: Secondary | ICD-10-CM

## 2016-06-10 DIAGNOSIS — R413 Other amnesia: Secondary | ICD-10-CM

## 2016-06-10 LAB — COMPREHENSIVE METABOLIC PANEL
ALT: 13 U/L (ref 0–53)
AST: 17 U/L (ref 0–37)
Albumin: 4.1 g/dL (ref 3.5–5.2)
Alkaline Phosphatase: 70 U/L (ref 39–117)
BUN: 20 mg/dL (ref 6–23)
CHLORIDE: 105 meq/L (ref 96–112)
CO2: 29 mEq/L (ref 19–32)
Calcium: 9.2 mg/dL (ref 8.4–10.5)
Creatinine, Ser: 1.34 mg/dL (ref 0.40–1.50)
GFR: 54.44 mL/min — ABNORMAL LOW (ref >60.00)
GLUCOSE: 110 mg/dL — AB (ref 70–99)
POTASSIUM: 4 meq/L (ref 3.5–5.1)
SODIUM: 140 meq/L (ref 135–145)
TOTAL PROTEIN: 7.2 g/dL (ref 6.0–8.3)
Total Bilirubin: 0.6 mg/dL (ref 0.2–1.2)

## 2016-06-10 LAB — CBC WITH DIFFERENTIAL/PLATELET
Basophils Absolute: 0 10*3/uL (ref 0.0–0.1)
Basophils Relative: 0.7 % (ref 0.0–3.0)
EOS PCT: 3.7 % (ref 0.0–5.0)
Eosinophils Absolute: 0.2 10*3/uL (ref 0.0–0.7)
HCT: 43.3 % (ref 39.0–52.0)
Hemoglobin: 14.5 g/dL (ref 13.0–17.0)
LYMPHS ABS: 1.6 10*3/uL (ref 0.7–4.0)
Lymphocytes Relative: 28.5 % (ref 12.0–46.0)
MCHC: 33.4 g/dL (ref 30.0–36.0)
MCV: 93.6 fl (ref 78.0–100.0)
MONO ABS: 0.4 10*3/uL (ref 0.1–1.0)
Monocytes Relative: 7.5 % (ref 3.0–12.0)
NEUTROS PCT: 59.6 % (ref 43.0–77.0)
Neutro Abs: 3.3 10*3/uL (ref 1.4–7.7)
Platelets: 202 10*3/uL (ref 150.0–400.0)
RBC: 4.62 Mil/uL (ref 4.22–5.81)
RDW: 13.9 % (ref 11.5–15.5)
WBC: 5.6 10*3/uL (ref 4.0–10.5)

## 2016-06-10 LAB — HEMOGLOBIN A1C: Hgb A1c MFr Bld: 6.1 % (ref 4.6–6.5)

## 2016-06-10 LAB — TSH: TSH: 2.13 u[IU]/mL (ref 0.35–4.50)

## 2016-06-10 NOTE — Progress Notes (Signed)
Pre visit review using our clinic review tool, if applicable. No additional management support is needed unless otherwise documented below in the visit note. 

## 2016-06-10 NOTE — Progress Notes (Signed)
Subjective:  Calvin Burnett is a 82 y.o. year old very pleasant male patient who presents for/with See problem oriented charting ROS- No chest pain or shortness of breath. No headache or blurry vision. At night- sometimes chest is sweaty when he wakes up for 3 months.see any ROS included in HPI as well.   Past Medical History-  Patient Active Problem List   Diagnosis Date Noted  . Rectal pain 10/14/2014    Priority: High  . Prostate cancer ([place]) 08/12/2012    Priority: High  . Diabetes mellitus type II, controlled ([place]) 07/31/2010    Priority: High  . Syncope 09/27/2014    Priority: Medium  . CKD (chronic kidney disease), stage III 06/01/2014    Priority: Medium  . Dyslipidemia 03/01/2014    Priority: Medium  . Basal cell carcinoma 05/03/2009    Priority: Medium  . Memory loss 07/27/2008    Priority: Medium  . Essential hypertension 01/30/2007    Priority: Medium  . History of adenomatous polyp of colon 10/27/2014    Priority: Low  . Bowel habit changes 10/14/2014    Priority: Low  . Bradycardia 09/27/2014    Priority: Low  . LEG CRAMPS, NOCTURNAL 04/30/2010    Priority: Low  . Cervical spondylosis without myelopathy 12/11/2007    Priority: Low  . GERD 11/05/2007    Priority: Low    Medications- reviewed and updated Current Outpatient Prescriptions  Medication Sig Dispense Refill  . glucose blood (ACCU-CHEK ACTIVE STRIPS) test strip Use to check blood sugars daily.Dx: E11.9 100 each 12  . Lancets (ACCU-CHEK SOFT TOUCH) lancets Use as instructed 100 each 12  . latanoprost (XALATAN) 0.005 % ophthalmic solution PLACE 1 DROP INTO BOTH EYES NIGHTLY.  12  . Meth-Hyo-M Bl-Na Phos-Ph Sal (URIBEL) 118 MG CAPS Reported on 11/13/2015  6  . ramipril (ALTACE) 5 MG capsule Take 1 capsule (5 mg total) by mouth daily. 90 capsule 3  . sertraline (ZOLOFT) 50 MG tablet TAKE 1 TABLET BY MOUTH ONCE DAILY 30 tablet 2  . traMADol (ULTRAM) 50 MG tablet Take 1 tablet (50 mg total) by mouth  every 8 (eight) hours as needed. 30 tablet 0  . valACYclovir (VALTREX) 1000 MG tablet Take 1 tablet (1,000 mg total) by mouth 3 (three) times daily. 21 tablet 0  . pantoprazole (PROTONIX) 40 MG tablet TAKE 1 TABLET (40 MG TOTAL) BY MOUTH DAILY. (Patient not taking: Reported on 06/10/2016) 30 tablet 11   No current facility-administered medications for this visit.     Objective: BP 128/82 (BP Location: Left Arm, Patient Position: Sitting, Cuff Size: Large)   Pulse (!) 56   Ht 5\' 8"  (1.727 m)   Wt 162 lb 3.2 oz (73.6 kg)   SpO2 98%   BMI 24.66 kg/m  Gen: NAD, resting comfortably CV: RRR no murmurs rubs or gallops Lungs: CTAB no crackles, wheeze, rhonchi Abdomen: soft/nontender/nondistended/normal bowel sounds. No rebound or guarding. Large scar right side of abdomen from prior surgery.  Ext: no edema Skin: warm, dry   Assessment/Plan:  Diabetes mellitus type II, controlled ([place]) S: well controlled. On glipizide 5mg  XL in past- stopped last visit and a1c remained at 6.5 Lab Results  Component Value Date   HGBA1C 6.5 02/05/2016   HGBA1C 5.8 08/10/2015   HGBA1C 6.7 (H) 06/01/2014   A/P: update a1c today- restart glipizide if above 6.5.   Essential hypertension S: controlled reasonably on ramipril 5mg  BP Readings from Last 3 Encounters:  06/10/16 128/82  02/08/16 [place] Kitchen)  150/98  02/05/16 140/88  A/P:Continue current meds:  Doing well. Would consider titrating up if above 130/85.   CKD (chronic kidney disease), stage III S: GFR 50-60 range- avoiding nsaids, bp controlled A/P:update GFR today  Memory loss Declines worsening memory. Not on namenda- continue to monitor.   Patient's weight down a few lbs- states not really trying. IN addition- occasionally wakes up at night and if he does his chest and legs are sweaty. 3-4 months. Not worsening. No fevers. We opted to monitor. Could be due to zoloft but do not want to take off with loss of wife and daughter in 2016.    Return  precautions advised.  [name], MD

## 2016-06-10 NOTE — Assessment & Plan Note (Signed)
Declines worsening memory. Not on namenda- continue to monitor.

## 2016-06-10 NOTE — Assessment & Plan Note (Signed)
S: GFR 50-60 range- avoiding nsaids, bp controlled A/P:update GFR today

## 2016-06-10 NOTE — Patient Instructions (Addendum)
Labs before you go  If worsening of symptoms- including more weight loss or worsening sweating at night or fevers at night- see me sooner  Health Maintenance Due  Topic Date Due  . OPHTHALMOLOGY EXAM  05/09/2011  If you have had your eye exam within the last year, please sign release of information at check out desk. If you have not had an eye exam within a year, please get one at this time as this is important for your diabetes care

## 2016-06-10 NOTE — Assessment & Plan Note (Signed)
S: controlled reasonably on ramipril 5mg  BP Readings from Last 3 Encounters:  06/10/16 128/82  02/08/16 (!) 150/98  02/05/16 140/88  A/P:Continue current meds:  Doing well. Would consider titrating up if above 130/85.

## 2016-06-10 NOTE — Assessment & Plan Note (Signed)
S: well controlled. On glipizide 5mg  XL in past- stopped last visit and a1c remained at 6.5 Lab Results  Component Value Date   HGBA1C 6.5 02/05/2016   HGBA1C 5.8 08/10/2015   HGBA1C 6.7 (H) 06/01/2014   A/P: update a1c today- restart glipizide if above 6.5.

## 2016-06-25 ENCOUNTER — Other Ambulatory Visit: Payer: Self-pay | Admitting: Family Medicine

## 2016-06-26 ENCOUNTER — Telehealth: Payer: Self-pay | Admitting: Family Medicine

## 2016-06-26 NOTE — Telephone Encounter (Signed)
Lets try escitalopram 10mg  daily- please send this in for him. Considering he has had weight loss and now night sweats would like to see him back in January- if continues may need to consider cancer workup (do not have to share this last portion with patient)

## 2016-06-26 NOTE — Telephone Encounter (Signed)
Patient dropped by office stating that the Sertraline is making him have pretty bad night sweats. Patient wanted to talk with MD or his nurse to see what can be done or if there is another medication that he can take. Patient states that he doesn't think it's a good idea to, "just stop taking the medicine." Please advise. Pt can also be reached at [phone].

## 2016-07-02 ENCOUNTER — Other Ambulatory Visit: Payer: Self-pay

## 2016-07-02 MED ORDER — ESCITALOPRAM OXALATE 10 MG PO TABS: 10.0000 mg | ORAL_TABLET | Freq: Every day | ORAL | 1 refills | Status: DC

## 2016-07-02 NOTE — Telephone Encounter (Signed)
Medication sent to pharmacy. Spoke with patient who verbalized understanding and scheduled him for January

## 2016-07-29 ENCOUNTER — Ambulatory Visit (INDEPENDENT_AMBULATORY_CARE_PROVIDER_SITE_OTHER): Payer: PPO | Admitting: Family Medicine

## 2016-07-29 ENCOUNTER — Encounter: Payer: Self-pay | Admitting: Family Medicine

## 2016-07-29 VITALS — BP 138/92 | HR 51 | Temp 97.6°F | Wt 170.0 lb

## 2016-07-29 DIAGNOSIS — I10 Essential (primary) hypertension: Secondary | ICD-10-CM

## 2016-07-29 DIAGNOSIS — F324 Major depressive disorder, single episode, in partial remission: Secondary | ICD-10-CM | POA: Diagnosis not present

## 2016-07-29 DIAGNOSIS — F325 Major depressive disorder, single episode, in full remission: Secondary | ICD-10-CM | POA: Insufficient documentation

## 2016-07-29 NOTE — Progress Notes (Signed)
Subjective:  Calvin Burnett is a 83 y.o. year old very pleasant male patient who presents for/with See problem oriented charting ROS- no SI. Admits to some depressed mood and anhedonia. No chest pain or shortness of breath. No headache or blurry vision.    Past Medical History-  Patient Active Problem List   Diagnosis Date Noted  . Rectal pain 10/14/2014    Priority: High  . Prostate cancer ([place]) 08/12/2012    Priority: High  . Diabetes mellitus type II, controlled ([place]) 07/31/2010    Priority: High  . Syncope 09/27/2014    Priority: Medium  . CKD (chronic kidney disease), stage III 06/01/2014    Priority: Medium  . Dyslipidemia 03/01/2014    Priority: Medium  . Basal cell carcinoma 05/03/2009    Priority: Medium  . Memory loss 07/27/2008    Priority: Medium  . Essential hypertension 01/30/2007    Priority: Medium  . History of adenomatous polyp of colon 10/27/2014    Priority: Low  . Bowel habit changes 10/14/2014    Priority: Low  . Bradycardia 09/27/2014    Priority: Low  . LEG CRAMPS, NOCTURNAL 04/30/2010    Priority: Low  . Cervical spondylosis without myelopathy 12/11/2007    Priority: Low  . GERD 11/05/2007    Priority: Low  . Major depression 07/29/2016    Medications- reviewed and updated Current Outpatient Prescriptions  Medication Sig Dispense Refill  . escitalopram (LEXAPRO) 10 MG tablet Take 1 tablet (10 mg total) by mouth daily. 30 tablet 1  . glucose blood (ACCU-CHEK ACTIVE STRIPS) test strip Use to check blood sugars daily.Dx: E11.9 100 each 12  . Lancets (ACCU-CHEK SOFT TOUCH) lancets Use as instructed 100 each 12  . latanoprost (XALATAN) 0.005 % ophthalmic solution PLACE 1 DROP INTO BOTH EYES NIGHTLY.  12  . Meth-Hyo-M Bl-Na Phos-Ph Sal (URIBEL) 118 MG CAPS Reported on 11/13/2015  6  . pantoprazole (PROTONIX) 40 MG tablet TAKE 1 TABLET (40 MG TOTAL) BY MOUTH DAILY. (Patient not taking: Reported on 06/10/2016) 30 tablet 11  . ramipril (ALTACE) 5 MG  capsule Take 1 capsule (5 mg total) by mouth daily. 90 capsule 3  . traMADol (ULTRAM) 50 MG tablet Take 1 tablet (50 mg total) by mouth every 8 (eight) hours as needed. 30 tablet 0  . valACYclovir (VALTREX) 1000 MG tablet Take 1 tablet (1,000 mg total) by mouth 3 (three) times daily. 21 tablet 0   No current facility-administered medications for this visit.     Objective: BP (!) 138/92   Pulse (!) 51   Temp 97.6 F (36.4 C) (Oral)   Wt 170 lb (77.1 kg)   SpO2 98%   BMI 25.85 kg/m  Gen: NAD, becomes emotional when discussing his deceased wife CV: RRR no murmurs rubs or gallops Lungs: CTAB no crackles, wheeze, rhonchi Ext: no edema  Assessment/Plan:  Major depression S:On zoloft- Weight had been trending down. Also was waking up at night with some sweatiness. zoloft related? So we switched to lexapro 10mg . Weight up 8 lbs at this point. No more night sweats. Going to grief share counseling now for 3rd time. PHQ9 of 9- holidays particularly tough time for him. No SI. #1 with 2, #2 with 1.  A/P: we discussed option of increasing to 20mg  lexapro, he prefers to keep current dose as he feels he is improving. Going to continue his counseling- advised repeat visit 3 months. As always, to contact us if any suicidal ideation develops  Essential  hypertension S: controlled on ramipril alone last visit but was down 8 lbs. Weight now back up and BP up too BP Readings from Last 3 Encounters:  07/29/16 (!) 138/92  06/10/16 128/82  02/08/16 (!) 150/98  A/P:Continue current meds for now- we agreed if BP was still above goal systolic 90 at follow up would titrate medication.    Return in about 3 months (around 10/27/2016) for follow up- or sooner if needed.  Return precautions advised.  [name], MD

## 2016-07-29 NOTE — Progress Notes (Signed)
Pre visit review using our clinic review tool, if applicable. No additional management support is needed unless otherwise documented below in the visit note. 

## 2016-07-29 NOTE — Assessment & Plan Note (Signed)
S:On zoloft- Weight had been trending down. Also was waking up at night with some sweatiness. zoloft related? So we switched to lexapro 10mg . Weight up 8 lbs at this point. No more night sweats. Going to grief share counseling now for 3rd time. PHQ9 of 9- holidays particularly tough time for him. No SI. #1 with 2, #2 with 1.  A/P: we discussed option of increasing to 20mg  lexapro, he prefers to keep current dose as he feels he is improving. Going to continue his counseling- advised repeat visit 3 months. As always, to contact us if any suicidal ideation develops

## 2016-07-29 NOTE — Patient Instructions (Signed)
Continue current medicine. Contact us if you feel like you do not continue to improve  Follow up 3 months

## 2016-07-29 NOTE — Assessment & Plan Note (Signed)
S: controlled on ramipril alone last visit but was down 8 lbs. Weight now back up and BP up too BP Readings from Last 3 Encounters:  07/29/16 (!) 138/92  06/10/16 128/82  02/08/16 (!) 150/98  A/P:Continue current meds for now- we agreed if BP was still above goal systolic 90 at follow up would titrate medication.

## 2016-08-03 ENCOUNTER — Other Ambulatory Visit: Payer: Self-pay | Admitting: Family Medicine

## 2016-08-05 DIAGNOSIS — H401132 Primary open-angle glaucoma, bilateral, moderate stage: Secondary | ICD-10-CM | POA: Diagnosis not present

## 2016-08-05 DIAGNOSIS — Z961 Presence of intraocular lens: Secondary | ICD-10-CM | POA: Diagnosis not present

## 2016-08-05 DIAGNOSIS — H16143 Punctate keratitis, bilateral: Secondary | ICD-10-CM | POA: Diagnosis not present

## 2016-08-05 DIAGNOSIS — H5712 Ocular pain, left eye: Secondary | ICD-10-CM | POA: Diagnosis not present

## 2016-08-29 ENCOUNTER — Other Ambulatory Visit: Payer: Self-pay | Admitting: Family Medicine

## 2016-09-03 ENCOUNTER — Encounter: Payer: Self-pay | Admitting: Family Medicine

## 2016-09-03 ENCOUNTER — Ambulatory Visit (INDEPENDENT_AMBULATORY_CARE_PROVIDER_SITE_OTHER): Payer: PPO | Admitting: Family Medicine

## 2016-09-03 VITALS — BP 128/78 | HR 61 | Temp 98.3°F | Ht 68.0 in | Wt 169.0 lb

## 2016-09-03 DIAGNOSIS — A084 Viral intestinal infection, unspecified: Secondary | ICD-10-CM | POA: Diagnosis not present

## 2016-09-03 DIAGNOSIS — B9789 Other viral agents as the cause of diseases classified elsewhere: Secondary | ICD-10-CM

## 2016-09-03 DIAGNOSIS — J069 Acute upper respiratory infection, unspecified: Secondary | ICD-10-CM | POA: Diagnosis not present

## 2016-09-03 MED ORDER — HYDROCODONE-HOMATROPINE 5-1.5 MG/5ML PO SYRP: 5.0000 mL | ORAL_SOLUTION | Freq: Three times a day (TID) | ORAL | 0 refills | Status: DC | PRN

## 2016-09-03 NOTE — Progress Notes (Signed)
Calvin Burnett is a 83 year old male who comes in today for evaluation of a viral type syndrome manifested by vomiting and diarrhea for 24 hours  He states he didn't feel well on Sunday he noticed a slight cough. No fever chills etc. Yesterday had some nausea vomiting and diarrhea. He called 1 to be seen. Review of systems otherwise negative  BP 128/78 (BP Location: Right Arm, Patient Position: Sitting, Cuff Size: Normal)   Pulse 61   Temp 98.3 F (36.8 C) (Oral)   Ht 5\' 8"  (1.727 m)   Wt 169 lb (76.7 kg)   BMI 25.70 kg/m  HEENT were negative neck was supple thyroid not enlarged cardiopulmonary exam normal abdominal exam normal  Impression viral syndrome.......... treat symptomatically

## 2016-09-03 NOTE — Patient Instructions (Signed)
Rest at home  Clear liquid diet until the nausea vomiting and diarrhea has stopped  Hydromet.......Marland Kitchen 1/2 teaspoon 3 times daily when necessary for the cough nausea vomiting and diarrhea  Return when necessary

## 2016-09-16 ENCOUNTER — Ambulatory Visit (INDEPENDENT_AMBULATORY_CARE_PROVIDER_SITE_OTHER): Payer: PPO | Admitting: Family Medicine

## 2016-09-16 ENCOUNTER — Encounter: Payer: Self-pay | Admitting: Family Medicine

## 2016-09-16 VITALS — BP 150/92 | HR 56 | Temp 97.6°F | Ht 68.0 in | Wt 165.2 lb

## 2016-09-16 DIAGNOSIS — I10 Essential (primary) hypertension: Secondary | ICD-10-CM

## 2016-09-16 DIAGNOSIS — E119 Type 2 diabetes mellitus without complications: Secondary | ICD-10-CM

## 2016-09-16 DIAGNOSIS — F324 Major depressive disorder, single episode, in partial remission: Secondary | ICD-10-CM | POA: Diagnosis not present

## 2016-09-16 DIAGNOSIS — R61 Generalized hyperhidrosis: Secondary | ICD-10-CM | POA: Insufficient documentation

## 2016-09-16 LAB — CBC WITH DIFFERENTIAL/PLATELET
BASOS ABS: 0 10*3/uL (ref 0.0–0.1)
Basophils Relative: 0.8 % (ref 0.0–3.0)
EOS ABS: 0.3 10*3/uL (ref 0.0–0.7)
Eosinophils Relative: 5.7 % — ABNORMAL HIGH (ref 0.0–5.0)
HCT: 44.1 % (ref 39.0–52.0)
Hemoglobin: 14.9 g/dL (ref 13.0–17.0)
LYMPHS ABS: 1.4 10*3/uL (ref 0.7–4.0)
Lymphocytes Relative: 25.4 % (ref 12.0–46.0)
MCHC: 33.8 g/dL (ref 30.0–36.0)
MCV: 92.4 fl (ref 78.0–100.0)
MONO ABS: 0.3 10*3/uL (ref 0.1–1.0)
MONOS PCT: 6.4 % (ref 3.0–12.0)
NEUTROS ABS: 3.4 10*3/uL (ref 1.4–7.7)
NEUTROS PCT: 61.7 % (ref 43.0–77.0)
PLATELETS: 244 10*3/uL (ref 150.0–400.0)
RBC: 4.78 Mil/uL (ref 4.22–5.81)
RDW: 13.8 % (ref 11.5–15.5)
WBC: 5.5 10*3/uL (ref 4.0–10.5)

## 2016-09-16 LAB — COMPREHENSIVE METABOLIC PANEL
ALK PHOS: 80 U/L (ref 39–117)
ALT: 11 U/L (ref 0–53)
AST: 16 U/L (ref 0–37)
Albumin: 4.1 g/dL (ref 3.5–5.2)
BILIRUBIN TOTAL: 0.5 mg/dL (ref 0.2–1.2)
BUN: 14 mg/dL (ref 6–23)
CO2: 30 meq/L (ref 19–32)
Calcium: 9.3 mg/dL (ref 8.4–10.5)
Chloride: 104 mEq/L (ref 96–112)
Creatinine, Ser: 1.21 mg/dL (ref 0.40–1.50)
GFR: 61.2 mL/min (ref >60.00)
GLUCOSE: 98 mg/dL (ref 70–99)
Potassium: 4.1 mEq/L (ref 3.5–5.1)
SODIUM: 143 meq/L (ref 135–145)
TOTAL PROTEIN: 6.9 g/dL (ref 6.0–8.3)

## 2016-09-16 LAB — TSH: TSH: 1.68 u[IU]/mL (ref 0.35–4.50)

## 2016-09-16 LAB — HEMOGLOBIN A1C: Hgb A1c MFr Bld: 6.7 % — ABNORMAL HIGH (ref 4.6–6.5)

## 2016-09-16 MED ORDER — FLUOXETINE HCL 20 MG PO TABS: 20.0000 mg | ORAL_TABLET | Freq: Every day | ORAL | 3 refills | Status: DC

## 2016-09-16 NOTE — Patient Instructions (Addendum)
Stop lexapro after today, start prozac/fluoxetine 20mg  tomorrow. Lets see if this helps you sweat less at night.   Blood pressure slightly up- if remains up at follow up may need to adjust medications  6 week follow up but contact us immediately if any thoughts of hurting yourself.   Please stop by lab before you go

## 2016-09-16 NOTE — Progress Notes (Signed)
Pre visit review using our clinic review tool, if applicable. No additional management support is needed unless otherwise documented below in the visit note. 

## 2016-09-16 NOTE — Assessment & Plan Note (Signed)
S: well controlled. On no medication- had been on glipizide in past CBGs- not checking. He has felt more fatigued so I encouraged him to make sure it was not a cbg issue.  Lab Results  Component Value Date   HGBA1C 6.7 (H) 09/16/2016   HGBA1C 6.1 06/10/2016   HGBA1C 6.5 02/05/2016   A/P: a1c is up but still in reasonable range- continue without new medicaiton

## 2016-09-16 NOTE — Progress Notes (Signed)
Subjective:  Calvin Burnett is a 83 y.o. year old very pleasant male patient who presents for/with See problem oriented charting ROS-  fatigued, run down since GI bug but no diarrhea or vomiting or nausea. No chest pain or shortness of breath  Past Medical History-  Patient Active Problem List   Diagnosis Date Noted  . Major depression 07/29/2016    Priority: High  . Rectal pain 10/14/2014    Priority: High  . Prostate cancer ([place]) 08/12/2012    Priority: High  . Diabetes mellitus type II, controlled ([place]) 07/31/2010    Priority: High  . Syncope 09/27/2014    Priority: Medium  . CKD (chronic kidney disease), stage III 06/01/2014    Priority: Medium  . Dyslipidemia 03/01/2014    Priority: Medium  . Basal cell carcinoma 05/03/2009    Priority: Medium  . Memory loss 07/27/2008    Priority: Medium  . Essential hypertension 01/30/2007    Priority: Medium  . History of adenomatous polyp of colon 10/27/2014    Priority: Low  . Bowel habit changes 10/14/2014    Priority: Low  . Bradycardia 09/27/2014    Priority: Low  . LEG CRAMPS, NOCTURNAL 04/30/2010    Priority: Low  . Cervical spondylosis without myelopathy 12/11/2007    Priority: Low  . GERD 11/05/2007    Priority: Low  . Night sweats 09/16/2016    Medications- reviewed and updated Current Outpatient Prescriptions  Medication Sig Dispense Refill  . glucose blood (ACCU-CHEK ACTIVE STRIPS) test strip Use to check blood sugars daily.Dx: E11.9 100 each 12  . Lancets (ACCU-CHEK SOFT TOUCH) lancets Use as instructed 100 each 12  . latanoprost (XALATAN) 0.005 % ophthalmic solution PLACE 1 DROP INTO BOTH EYES NIGHTLY.  12  . Meth-Hyo-M Bl-Na Phos-Ph Sal (URIBEL) 118 MG CAPS Reported on 11/13/2015  6  . pantoprazole (PROTONIX) 40 MG tablet TAKE 1 TABLET (40 MG TOTAL) BY MOUTH DAILY. 30 tablet 11  . ramipril (ALTACE) 5 MG capsule TAKE 1 CAPSULE (5 MG TOTAL) BY MOUTH DAILY. 90 capsule 3  . traMADol (ULTRAM) 50 MG tablet Take 1  tablet (50 mg total) by mouth every 8 (eight) hours as needed. 30 tablet 0  . FLUoxetine (PROZAC) 20 MG tablet Take 1 tablet (20 mg total) by mouth daily. 30 tablet 3   Objective: BP (!) 150/92 (BP Location: Left Arm, Patient Position: Sitting, Cuff Size: Large)   Pulse (!) 56   Temp 97.6 F (36.4 C) (Oral)   Ht 5\' 8"  (1.727 m)   Wt 165 lb 3.2 oz (74.9 kg)   SpO2 97%   BMI 25.12 kg/m  Gen: NAD, resting comfortably CV: RRR no murmurs rubs or gallops Lungs: CTAB no crackles, wheeze, rhonchi Ext: no edema Skin: warm, dry, no rash  Assessment/Plan:  Major depression S: compliant with lexapro 10mg . He feels like his depression is much better now that he is out of the holiday season. He is concerned about night sweats on lexapro vs. Ramipril. Had been on zoloft prior and night sweats seemed to resolve on lexapro A/P: he would like to trial alternate medication- will trial prozac and follow up 6 weeks. If does not improve- may trila different BP med- as noted BP not controlled anyway  Essential hypertension S: controlled on ramipril alone..  BP Readings from Last 3 Encounters:  09/16/16 (!) 150/92  09/03/16 128/78  07/29/16 (!) 138/92  A/P:Continue current meds for now but follow up 6 weeks- was controlled just a  few weeks ago but had GI bug at that time- if not improved at follow up consider new/different BP medication to see if makes a difference with night sweats  Diabetes mellitus type II, controlled ([place]) S: well controlled. On no medication- had been on glipizide in past CBGs- not checking. He has felt more fatigued so I encouraged him to make sure it was not a cbg issue.  Lab Results  Component Value Date   HGBA1C 6.7 (H) 09/16/2016   HGBA1C 6.1 06/10/2016   HGBA1C 6.5 02/05/2016   A/P: a1c is up but still in reasonable range- continue without new medicaiton   Night sweats S: started within last 6 months. Regular follow up with urology for prostate cancer. Colonoscopy up  to date in 2016 adenoma x 3 . No shortness of breath to suggest lung cancer. Wakes up sweating. Denies daytime issues.  A/P: patient wonders about lexapro or ramipril- both possibilities and will trial adjustment. He has no othersystemic signs other than weight loss from recent GI illness and some lingering time to recover. wouldhave to consider additional malignancy testing depending on trend in patient symptoms   Return in about 6 weeks (around 10/28/2016) for follow up- or sooner if needed.  Orders Placed This Encounter  Procedures  . CBC with Differential/Platelet  . Comprehensive metabolic panel    [place]  . TSH    [place]  . Hemoglobin A1c    [place]   Meds ordered this encounter  Medications  . FLUoxetine (PROZAC) 20 MG tablet    Sig: Take 1 tablet (20 mg total) by mouth daily.    Dispense:  30 tablet    Refill:  3   Return precautions advised.  [name], MD

## 2016-09-16 NOTE — Assessment & Plan Note (Signed)
S: controlled on ramipril alone..  BP Readings from Last 3 Encounters:  09/16/16 (!) 150/92  09/03/16 128/78  07/29/16 (!) 138/92  A/P:Continue current meds for now but follow up 6 weeks- was controlled just a few weeks ago but had GI bug at that time- if not improved at follow up consider new/different BP medication to see if makes a difference with night sweats

## 2016-09-16 NOTE — Assessment & Plan Note (Addendum)
S: started within last 6 months. Regular follow up with urology for prostate cancer. Colonoscopy up to date in 2016 adenoma x 3 . No shortness of breath to suggest lung cancer. Wakes up sweating. Denies daytime issues.  A/P: patient wonders about lexapro or ramipril- both possibilities and will trial adjustment. He has no othersystemic signs other than weight loss from recent GI illness and some lingering time to recover. wouldhave to consider additional malignancy testing depending on trend in patient symptoms

## 2016-09-16 NOTE — Assessment & Plan Note (Signed)
S: compliant with lexapro 10mg . He feels like his depression is much better now that he is out of the holiday season. He is concerned about night sweats on lexapro vs. Ramipril. Had been on zoloft prior and night sweats seemed to resolve on lexapro A/P: he would like to trial alternate medication- will trial prozac and follow up 6 weeks. If does not improve- may trila different BP med- as noted BP not controlled anyway

## 2016-09-17 ENCOUNTER — Telehealth: Payer: Self-pay | Admitting: Family Medicine

## 2016-09-17 NOTE — Telephone Encounter (Signed)
Pt returning your call

## 2016-09-17 NOTE — Telephone Encounter (Signed)
Spoke with patient and reviewed lab results. 

## 2016-09-22 ENCOUNTER — Other Ambulatory Visit: Payer: Self-pay | Admitting: Adult Health

## 2016-09-22 DIAGNOSIS — B029 Zoster without complications: Secondary | ICD-10-CM

## 2016-09-24 NOTE — Telephone Encounter (Signed)
Dr. Hunter patient.  

## 2016-09-25 NOTE — Telephone Encounter (Signed)
Would not refill as agree- appears was for shingles pain

## 2016-10-25 ENCOUNTER — Ambulatory Visit: Payer: PPO | Admitting: Family Medicine

## 2016-10-31 ENCOUNTER — Ambulatory Visit (INDEPENDENT_AMBULATORY_CARE_PROVIDER_SITE_OTHER): Payer: PPO | Admitting: Family Medicine

## 2016-10-31 ENCOUNTER — Encounter: Payer: Self-pay | Admitting: Family Medicine

## 2016-10-31 DIAGNOSIS — I10 Essential (primary) hypertension: Secondary | ICD-10-CM

## 2016-10-31 DIAGNOSIS — F325 Major depressive disorder, single episode, in full remission: Secondary | ICD-10-CM

## 2016-10-31 DIAGNOSIS — R61 Generalized hyperhidrosis: Secondary | ICD-10-CM

## 2016-10-31 DIAGNOSIS — C61 Malignant neoplasm of prostate: Secondary | ICD-10-CM

## 2016-10-31 NOTE — Progress Notes (Signed)
Subjective:  Calvin Burnett is a 83 y.o. year old very pleasant male patient who presents for/with See problem oriented charting ROS- night sweats. No unintentional weight loss or fevers. No chest pain or shortness of breath. No blood in stool or melena. No cough/congestoin   Past Medical History-  Patient Active Problem List   Diagnosis Date Noted  . Night sweats 09/16/2016    Priority: High  . Major depression 07/29/2016    Priority: High  . Rectal pain 10/14/2014    Priority: High  . Prostate cancer ([place]) 08/12/2012    Priority: High  . Diabetes mellitus type II, controlled ([place]) 07/31/2010    Priority: High  . Syncope 09/27/2014    Priority: Medium  . CKD (chronic kidney disease), stage III 06/01/2014    Priority: Medium  . Dyslipidemia 03/01/2014    Priority: Medium  . Basal cell carcinoma 05/03/2009    Priority: Medium  . Memory loss 07/27/2008    Priority: Medium  . Essential hypertension 01/30/2007    Priority: Medium  . History of adenomatous polyp of colon 10/27/2014    Priority: Low  . Bowel habit changes 10/14/2014    Priority: Low  . Bradycardia 09/27/2014    Priority: Low  . LEG CRAMPS, NOCTURNAL 04/30/2010    Priority: Low  . Cervical spondylosis without myelopathy 12/11/2007    Priority: Low  . GERD 11/05/2007    Priority: Low    Medications- reviewed and updated Current Outpatient Prescriptions  Medication Sig Dispense Refill  . FLUoxetine (PROZAC) 20 MG tablet Take 1 tablet (20 mg total) by mouth daily. 30 tablet 3  . latanoprost (XALATAN) 0.005 % ophthalmic solution PLACE 1 DROP INTO BOTH EYES NIGHTLY.  12  . ramipril (ALTACE) 5 MG capsule TAKE 1 CAPSULE (5 MG TOTAL) BY MOUTH DAILY. 90 capsule 3  . glucose blood (ACCU-CHEK ACTIVE STRIPS) test strip Use to check blood sugars daily.Dx: E11.9 (Patient not taking: Reported on 10/31/2016) 100 each 12  . Lancets (ACCU-CHEK SOFT TOUCH) lancets Use as instructed (Patient not taking: Reported on 10/31/2016)  100 each 12   No current facility-administered medications for this visit.     Objective: BP 118/82 (BP Location: Left Arm, Patient Position: Sitting, Cuff Size: Large)   Pulse 62   Temp 97.4 F (36.3 C) (Oral)   Ht 5\' 8"  (1.727 m)   Wt 166 lb 9.6 oz (75.6 kg)   SpO2 96%   BMI 25.33 kg/m  Gen: NAD, resting comfortably CV: RRR no murmurs rubs or gallops Lungs: CTAB no crackles, wheeze, rhonchi Abdomen: soft/nontender Ext: no edema Skin: warm, dry Psych: tearful when talking about his wife  Assessment/Plan:  Major depression Night sweats S: patient was changed from lexapro 10mg  to prozac. Had been having some night sweats- ramipril and lexapro both listed as potential SE though. Had been on zoloft prior but had night sweats so switched- and had short term resolution before recurrence of night sweats.   PHQ9 of 1 today. Still with Crying spells at time- one chance she said he loved him and he didn't say it back and it was near her death. Night sweats improved again then worsened again A/P: no improvement in night sweats with changing of SSRI yet needed to control his depression- well controlled today other than at times crying spells and guilt of the not saying he loved his wife 1x- will send to behavioral health to help wiork through this issues  See last few notes on night  sweats. He has no other systemic signs and we opted to monitor only at this point. Does not want to change ramipril as well controlled BP and not strongly associated- he had been concerned of links in past. He has had trouble with meter to make sure CBG not low when he is feeling this way- will bring meter to next visit and jamie can help him  Night sweats See night sweats note  Essential hypertension S: controlled on ramipril 5mg .  BP Readings from Last 3 Encounters:  10/31/16 118/82  09/16/16 (!) 150/92  09/03/16 128/78  A/P:Continue current meds:  Good control - he seems to be less worried about this  causing night sweats so will keep same regimen  Prostate cancer Radioactive seeds 2014. Patient has follow up in may with Dr. Diona Fanti. We will await that note- may need to change this to history of prostate cancer depending on results   Return in about 3 months (around 01/30/2017) for follow up- or sooner if needed.  Return precautions advised.  [name], MD

## 2016-10-31 NOTE — Patient Instructions (Addendum)
bring meter to next visit and jamie can help you  Handout for behavioral health given and he will call

## 2016-10-31 NOTE — Assessment & Plan Note (Signed)
See night sweats note

## 2016-10-31 NOTE — Progress Notes (Signed)
Pre visit review using our clinic review tool, if applicable. No additional management support is needed unless otherwise documented below in the visit note. 

## 2016-10-31 NOTE — Assessment & Plan Note (Addendum)
Night sweats S: patient was changed from lexapro 10mg  to prozac. Had been having some night sweats- ramipril and lexapro both listed as potential SE though. Had been on zoloft prior but had night sweats so switched- and had short term resolution before recurrence of night sweats.   PHQ9 of 1 today. Still with Crying spells at time- one chance she said he loved him and he didn't say it back and it was near her death. Night sweats improved again then worsened again A/P: no improvement in night sweats with changing of SSRI yet needed to control his depression- well controlled today other than at times crying spells and guilt of the not saying he loved his wife 1x- will send to behavioral health to help wiork through this issues  See last few notes on night sweats. He has no other systemic signs and we opted to monitor only at this point. Does not want to change ramipril as well controlled BP and not strongly associated- he had been concerned of links in past. He has had trouble with meter to make sure CBG not low when he is feeling this way- will bring meter to next visit and jamie can help him

## 2016-10-31 NOTE — Assessment & Plan Note (Signed)
S: controlled on ramipril 5mg .  BP Readings from Last 3 Encounters:  10/31/16 118/82  09/16/16 (!) 150/92  09/03/16 128/78  A/P:Continue current meds:  Good control - he seems to be less worried about this causing night sweats so will keep same regimen

## 2016-10-31 NOTE — Assessment & Plan Note (Signed)
Radioactive seeds 2014. Patient has follow up in may with Dr. Diona Fanti. We will await that note- may need to change this to history of prostate cancer depending on results

## 2016-11-12 DIAGNOSIS — R300 Dysuria: Secondary | ICD-10-CM | POA: Diagnosis not present

## 2016-11-12 LAB — PSA: PSA: 0.03

## 2016-11-20 DIAGNOSIS — Z8546 Personal history of malignant neoplasm of prostate: Secondary | ICD-10-CM | POA: Diagnosis not present

## 2016-11-20 DIAGNOSIS — N5201 Erectile dysfunction due to arterial insufficiency: Secondary | ICD-10-CM | POA: Diagnosis not present

## 2016-11-20 DIAGNOSIS — R310 Gross hematuria: Secondary | ICD-10-CM | POA: Diagnosis not present

## 2016-11-25 ENCOUNTER — Encounter: Payer: Self-pay | Admitting: Family Medicine

## 2016-12-29 ENCOUNTER — Other Ambulatory Visit: Payer: Self-pay | Admitting: Family Medicine

## 2016-12-30 ENCOUNTER — Ambulatory Visit (INDEPENDENT_AMBULATORY_CARE_PROVIDER_SITE_OTHER): Payer: PPO | Admitting: Psychology

## 2016-12-30 DIAGNOSIS — F4323 Adjustment disorder with mixed anxiety and depressed mood: Secondary | ICD-10-CM | POA: Diagnosis not present

## 2017-01-13 DIAGNOSIS — H401132 Primary open-angle glaucoma, bilateral, moderate stage: Secondary | ICD-10-CM | POA: Diagnosis not present

## 2017-01-13 DIAGNOSIS — H18453 Nodular corneal degeneration, bilateral: Secondary | ICD-10-CM | POA: Diagnosis not present

## 2017-01-13 DIAGNOSIS — E119 Type 2 diabetes mellitus without complications: Secondary | ICD-10-CM | POA: Diagnosis not present

## 2017-01-13 DIAGNOSIS — Z961 Presence of intraocular lens: Secondary | ICD-10-CM | POA: Diagnosis not present

## 2017-01-13 DIAGNOSIS — H5712 Ocular pain, left eye: Secondary | ICD-10-CM | POA: Diagnosis not present

## 2017-01-13 LAB — HM DIABETES EYE EXAM

## 2017-01-20 ENCOUNTER — Other Ambulatory Visit: Payer: Self-pay | Admitting: Family Medicine

## 2017-01-24 ENCOUNTER — Other Ambulatory Visit: Payer: Self-pay | Admitting: Family Medicine

## 2017-01-30 ENCOUNTER — Ambulatory Visit (INDEPENDENT_AMBULATORY_CARE_PROVIDER_SITE_OTHER): Payer: PPO | Admitting: Psychology

## 2017-01-30 ENCOUNTER — Ambulatory Visit (INDEPENDENT_AMBULATORY_CARE_PROVIDER_SITE_OTHER): Payer: PPO | Admitting: Family Medicine

## 2017-01-30 ENCOUNTER — Encounter: Payer: Self-pay | Admitting: Family Medicine

## 2017-01-30 DIAGNOSIS — F325 Major depressive disorder, single episode, in full remission: Secondary | ICD-10-CM

## 2017-01-30 DIAGNOSIS — F4323 Adjustment disorder with mixed anxiety and depressed mood: Secondary | ICD-10-CM

## 2017-01-30 NOTE — Assessment & Plan Note (Signed)
S: Patient's phq9 was 1 last visit but was having fair amount of crying spells over death of wife 12 years ago on prozac 20mg . He states time has been helpful as well as grief classes which restart in august- he is looking forward to this. 2nd meeting with Dr. Glennon Hamilton today.   He is pleased with progress. He has yet to check CBG or temperature with night sweats. THey continue most nights.  A/P: continue current medicine- I would be hesitant to take him off prozac unless continues to sustaine progress- perhaps down to 10mg  at 4 month follow up then monitor for worsening as well as improvement in night sweats. Still encouraged cbg and temperature at night.

## 2017-01-30 NOTE — Progress Notes (Signed)
Subjective:  Calvin Burnett is a 83 y.o. year old very pleasant male patient who presents for/with See problem oriented charting ROS- some crying spells but improved. Denies depressed mood- mainly just misses wife and daughter. No SI. No chest pain or swelling.    Past Medical History-  Patient Active Problem List   Diagnosis Date Noted  . Night sweats 09/16/2016    Priority: High  . Major depression 07/29/2016    Priority: High  . Rectal pain 10/14/2014    Priority: High  . Prostate cancer ([place]) 08/12/2012    Priority: High  . Diabetes mellitus type II, controlled ([place]) 07/31/2010    Priority: High  . Syncope 09/27/2014    Priority: Medium  . CKD (chronic kidney disease), stage III 06/01/2014    Priority: Medium  . Dyslipidemia 03/01/2014    Priority: Medium  . Basal cell carcinoma 05/03/2009    Priority: Medium  . Memory loss 07/27/2008    Priority: Medium  . Essential hypertension 01/30/2007    Priority: Medium  . History of adenomatous polyp of colon 10/27/2014    Priority: Low  . Bowel habit changes 10/14/2014    Priority: Low  . Bradycardia 09/27/2014    Priority: Low  . LEG CRAMPS, NOCTURNAL 04/30/2010    Priority: Low  . Cervical spondylosis without myelopathy 12/11/2007    Priority: Low  . GERD 11/05/2007    Priority: Low    Medications- reviewed and updated Current Outpatient Prescriptions  Medication Sig Dispense Refill  . FLUoxetine (PROZAC) 20 MG tablet TAKE 1 TABLET (20 MG TOTAL) BY MOUTH DAILY. 90 tablet 1  . glucose blood (ACCU-CHEK ACTIVE STRIPS) test strip Use to check blood sugars daily.Dx: E11.9 100 each 12  . Lancets (ACCU-CHEK SOFT TOUCH) lancets Use as instructed 100 each 12  . latanoprost (XALATAN) 0.005 % ophthalmic solution PLACE 1 DROP INTO BOTH EYES NIGHTLY.  12  . pantoprazole (PROTONIX) 40 MG tablet TAKE 1 TABLET (40 MG TOTAL) BY MOUTH DAILY. 90 tablet 3  . ramipril (ALTACE) 5 MG capsule TAKE 1 CAPSULE (5 MG TOTAL) BY MOUTH DAILY. 90  capsule 3   No current facility-administered medications for this visit.     Objective: BP 122/82 (BP Location: Left Arm, Patient Position: Sitting, Cuff Size: Large)   Pulse (!) 53   Temp 97.6 F (36.4 C) (Oral)   Ht 5\' 8"  (1.727 m)   Wt 170 lb (77.1 kg)   SpO2 97%   BMI 25.85 kg/m  Gen: NAD, resting comfortably CV: RRR  With occasional ectopic beat, no murmurs rubs or gallops Lungs: CTAB no crackles, wheeze, rhonchi Abdomen: soft/nontender/nondistended/normal bowel sounds.  Ext: no edema Skin: warm, dry Psych: non depressed mood  Assessment/Plan:  Major depression S: Patient's phq9 was 1 last visit but was having fair amount of crying spells over death of wife 12 years ago on prozac 20mg . He states time has been helpful as well as grief classes which restart in august- he is looking forward to this. 2nd meeting with Dr. Glennon Hamilton today.   He is pleased with progress. He has yet to check CBG or temperature with night sweats. THey continue most nights.  A/P: continue current medicine- I would be hesitant to take him off prozac unless continues to sustaine progress- perhaps down to 10mg  at 4 month follow up then monitor for worsening as well as improvement in night sweats. Still encouraged cbg and temperature at night.    Return in about 4 months (around  06/02/2017) for follow up- or sooner if needed.   Encouraged AWV. He wonders about memory at time. Apparently did MMSE 01/2016 with score 26/30. If worsening- consider reversible causes of memory loss workup. Had been on namenda with Dr. Arnoldo Morale but has been off for some time without significant worsening- at least 18 months.   The duration of face-to-face time during this visit was greater than 15 minutes. Greater than 50% of this time was spent in counseling, explanation of diagnosis, planning of further management, and/or coordination of care including discussion and counseling about loss of wife 2 years ago and how he is dealing with  this, discussion on following up on memory.    Return precautions advised.  [name], MD

## 2017-01-30 NOTE — Patient Instructions (Addendum)
No changes today  Please check your temperature with night sweats as well as your blood sugar. If you need help learning to check blood sugar- schedule nurse visit with Roselyn Reef

## 2017-02-12 DIAGNOSIS — H1859 Other hereditary corneal dystrophies: Secondary | ICD-10-CM | POA: Diagnosis not present
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
## 2017-03-05 ENCOUNTER — Ambulatory Visit (INDEPENDENT_AMBULATORY_CARE_PROVIDER_SITE_OTHER): Payer: PPO

## 2017-03-05 VITALS — BP 150/80 | HR 73 | Ht 70.0 in | Wt 172.0 lb

## 2017-03-05 DIAGNOSIS — Z0000 Encounter for general adult medical examination without abnormal findings: Secondary | ICD-10-CM

## 2017-03-05 NOTE — Patient Instructions (Addendum)
Calvin Burnett , Thank you for taking time to come for your Medicare Wellness Visit. I appreciate your ongoing commitment to your health goals. Please review the following plan we discussed and let me know if I can assist you in the future.   A Tetanus is recommended every 10 years. Medicare covers a tetanus if you have a cut or wound; otherwise, there may be a charge. If you had not had a tetanus with pertusses, known as the Tdap, you can take this anytime.   Please make an apt with your dermatologist to check area of concern on left leg BK  If you need to, please make an apt with [name] and he can check this as well     Keep in mind the flu shot is an inactivated vaccine and takes at least 2 weeks to build immunity. The flu virus can be dormant for 4 days prior to symptoms Taking the flu shot at the beginning of the season can reduce the risk for the entire community.      There is a new 'chickenpox or shingles shot called the shingrix that you take at the pharmacy Shingrix is a vaccine for the prevention of Shingles in Adults 64 and older.  If you are on Medicare, you can request a prescription from your doctor to be filled at a pharmacy.  Please check with your benefits regarding applicable copays or out of pocket expenses.  The Shingrix is given in 2 vaccines approx 8 weeks apart. You must receive the 2nd dose prior to 6 months from receipt of the first.    These are the goals we discussed: Goals    . patient          Still stay engaged socialization        This is a list of the screening recommended for you and due dates:  Health Maintenance  Topic Date Due  . Eye exam for diabetics  05/09/2011  . Tetanus Vaccine  07/15/2016  . Complete foot exam   08/09/2016  . Flu Shot  02/12/2017  . Hemoglobin A1C  03/19/2017  . Pneumonia vaccines  Completed   Prevention of falls: Remove rugs or any tripping hazards in the home Use Non slip mats in bathtubs and  showers Placing grab bars next to the toilet and or shower Placing handrails on both sides of the stair way Adding extra lighting in the home.   Personal safety issues reviewed:  1. Consider starting a community watch program per [facility] 2.  Changes batteries is smoke detector and/or carbon monoxide detector  3.  If you have firearms; keep them in a safe place 4.  Wear protection when in the sun; Always wear sunscreen or a hat; It is good to have your doctor check your skin annually or review any new areas of concern 5. Driving safety; Keep in the right lane; stay 3 car lengths behind the car in front of you on the highway; look 3 times prior to pulling out; carry your cell phone everywhere you go!    Learn about the Yellow Dot program:  The program allows first responders at your emergency to have access to who your physician is, as well as your medications and medical conditions.  Citizens requesting the Yellow Dot Packages should contact Master Corporal Nunzio Cobbs at the [facility] [phone] for the first week of the program and beginning the week after Easter citizens should contact their local law  enforcement agency.     Health Maintenance, Male A healthy lifestyle and preventive care is important for your health and wellness. Ask your health care provider about what schedule of regular examinations is right for you. What should I know about weight and diet? Eat a Healthy Diet  Eat plenty of vegetables, fruits, whole grains, low-fat dairy products, and lean protein.  Do not eat a lot of foods high in solid fats, added sugars, or salt.  Maintain a Healthy Weight Regular exercise can help you achieve or maintain a healthy weight. You should:  Do at least 150 minutes of exercise each week. The exercise should increase your heart rate and make you sweat (moderate-intensity exercise).  Do strength-training exercises at least twice a  week.  Watch Your Levels of Cholesterol and Blood Lipids  Have your blood tested for lipids and cholesterol every 5 years starting at 81 years of age. If you are at high risk for heart disease, you should start having your blood tested when you are 81 years old. You may need to have your cholesterol levels checked more often if: ? Your lipid or cholesterol levels are high. ? You are older than 81 years of age. ? You are at high risk for heart disease.  What should I know about cancer screening? Many types of cancers can be detected early and may often be prevented. Lung Cancer  You should be screened every year for lung cancer if: ? You are a current smoker who has smoked for at least 30 years. ? You are a former smoker who has quit within the past 15 years.  Talk to your health care provider about your screening options, when you should start screening, and how often you should be screened.  Colorectal Cancer  Routine colorectal cancer screening usually begins at 81 years of age and should be repeated every 5-10 years until you are 81 years old. You may need to be screened more often if early forms of precancerous polyps or small growths are found. Your health care provider may recommend screening at an earlier age if you have risk factors for colon cancer.  Your health care provider may recommend using home test kits to check for hidden blood in the stool.  A small camera at the end of a tube can be used to examine your colon (sigmoidoscopy or colonoscopy). This checks for the earliest forms of colorectal cancer.  Prostate and Testicular Cancer  Depending on your age and overall health, your health care provider may do certain tests to screen for prostate and testicular cancer.  Talk to your health care provider about any symptoms or concerns you have about testicular or prostate cancer.  Skin Cancer  Check your skin from head to toe regularly.  Tell your health care provider  about any new moles or changes in moles, especially if: ? There is a change in a mole's size, shape, or color. ? You have a mole that is larger than a pencil eraser.  Always use sunscreen. Apply sunscreen liberally and repeat throughout the day.  Protect yourself by wearing long sleeves, pants, a wide-brimmed hat, and sunglasses when outside.  What should I know about heart disease, diabetes, and high blood pressure?  If you are 80-30 years of age, have your blood pressure checked every 3-5 years. If you are 61 years of age or older, have your blood pressure checked every year. You should have your blood pressure measured twice-once when you are at  a hospital or clinic, and once when you are not at a hospital or clinic. Record the average of the two measurements. To check your blood pressure when you are not at a hospital or clinic, you can use: ? An automated blood pressure machine at a pharmacy. ? A home blood pressure monitor.  Talk to your health care provider about your target blood pressure.  If you are between 7-49 years old, ask your health care provider if you should take aspirin to prevent heart disease.  Have regular diabetes screenings by checking your fasting blood sugar level. ? If you are at a normal weight and have a low risk for diabetes, have this test once every three years after the age of 54. ? If you are overweight and have a high risk for diabetes, consider being tested at a younger age or more often.  A one-time screening for abdominal aortic aneurysm (AAA) by ultrasound is recommended for men aged 74-75 years who are current or former smokers. What should I know about preventing infection? Hepatitis B If you have a higher risk for hepatitis B, you should be screened for this virus. Talk with your health care provider to find out if you are at risk for hepatitis B infection. Hepatitis C Blood testing is recommended for:  Everyone born from [birth year] through  1965.  Anyone with known risk factors for hepatitis C.  Sexually Transmitted Diseases (STDs)  You should be screened each year for STDs including gonorrhea and chlamydia if: ? You are sexually active and are younger than 81 years of age. ? You are older than 81 years of age and your health care provider tells you that you are at risk for this type of infection. ? Your sexual activity has changed since you were last screened and you are at an increased risk for chlamydia or gonorrhea. Ask your health care provider if you are at risk.  Talk with your health care provider about whether you are at high risk of being infected with HIV. Your health care provider may recommend a prescription medicine to help prevent HIV infection.  What else can I do?  Schedule regular health, dental, and eye exams.  Stay current with your vaccines (immunizations).  Do not use any tobacco products, such as cigarettes, chewing tobacco, and e-cigarettes. If you need help quitting, ask your health care provider.  Limit alcohol intake to no more than 2 drinks per day. One drink equals 12 ounces of beer, 5 ounces of wine, or 1 ounces of hard liquor.  Do not use street drugs.  Do not share needles.  Ask your health care provider for help if you need support or information about quitting drugs.  Tell your health care provider if you often feel depressed.  Tell your health care provider if you have ever been abused or do not feel safe at home. This information is not intended to replace advice given to you by your health care provider. Make sure you discuss any questions you have with your health care provider. Document Released: 12/28/2007 Document Revised: 02/28/2016 Document Reviewed: 04/04/2015 Elsevier Interactive Patient Education  2018 [place] in the Home Falls can cause injuries and can affect people from all age groups. There are many simple things that you can do to make  your home safe and to help prevent falls. What can I do on the outside of my home?  Regularly repair the edges of walkways and driveways  and fix any cracks.  Remove high doorway thresholds.  Trim any shrubbery on the main path into your home.  Use bright outdoor lighting.  Clear walkways of debris and clutter, including tools and rocks.  Regularly check that handrails are securely fastened and in good repair. Both sides of any steps should have handrails.  Install guardrails along the edges of any raised decks or porches.  Have leaves, snow, and ice cleared regularly.  Use sand or salt on walkways during winter months.  In the garage, clean up any spills right away, including grease or oil spills. What can I do in the bathroom?  Use night lights.  Install grab bars by the toilet and in the tub and shower. Do not use towel bars as grab bars.  Use non-skid mats or decals on the floor of the tub or shower.  If you need to sit down while you are in the shower, use a plastic, non-slip stool.  Keep the floor dry. Immediately clean up any water that spills on the floor.  Remove soap buildup in the tub or shower on a regular basis.  Attach bath mats securely with double-sided non-slip rug tape.  Remove throw rugs and other tripping hazards from the floor. What can I do in the bedroom?  Use night lights.  Make sure that a bedside light is easy to reach.  Do not use oversized bedding that drapes onto the floor.  Have a firm chair that has side arms to use for getting dressed.  Remove throw rugs and other tripping hazards from the floor. What can I do in the kitchen?  Clean up any spills right away.  Avoid walking on wet floors.  Place frequently used items in easy-to-reach places.  If you need to reach for something above you, use a sturdy step stool that has a grab bar.  Keep electrical cables out of the way.  Do not use floor polish or wax that makes floors  slippery. If you have to use wax, make sure that it is non-skid floor wax.  Remove throw rugs and other tripping hazards from the floor. What can I do in the stairways?  Do not leave any items on the stairs.  Make sure that there are handrails on both sides of the stairs. Fix handrails that are broken or loose. Make sure that handrails are as long as the stairways.  Check any carpeting to make sure that it is firmly attached to the stairs. Fix any carpet that is loose or worn.  Avoid having throw rugs at the top or bottom of stairways, or secure the rugs with carpet tape to prevent them from moving.  Make sure that you have a light switch at the top of the stairs and the bottom of the stairs. If you do not have them, have them installed. What are some other fall prevention tips?  Wear closed-toe shoes that fit well and support your feet. Wear shoes that have rubber soles or low heels.  When you use a stepladder, make sure that it is completely opened and that the sides are firmly locked. Have someone hold the ladder while you are using it. Do not climb a closed stepladder.  Add color or contrast paint or tape to grab bars and handrails in your home. Place contrasting color strips on the first and last steps.  Use mobility aids as needed, such as canes, walkers, scooters, and crutches.  Turn on lights if it is dark. Replace  any light bulbs that burn out.  Set up furniture so that there are clear paths. Keep the furniture in the same spot.  Fix any uneven floor surfaces.  Choose a carpet design that does not hide the edge of steps of a stairway.  Be aware of any and all pets.  Review your medicines with your healthcare provider. Some medicines can cause dizziness or changes in blood pressure, which increase your risk of falling. Talk with your health care provider about other ways that you can decrease your risk of falls. This may include working with a physical therapist or trainer  to improve your strength, balance, and endurance. This information is not intended to replace advice given to you by your health care provider. Make sure you discuss any questions you have with your health care provider. Document Released: 06/21/2002 Document Revised: 11/28/2015 Document Reviewed: 08/05/2014 Elsevier Interactive Patient Education  2017 [employer].

## 2017-03-05 NOTE — Progress Notes (Signed)
I have reviewed and agree with note, evaluation, plan. Will continue to follow memory issues since patient refused evaluation today.   [name], MD

## 2017-03-06 ENCOUNTER — Ambulatory Visit: Payer: PPO | Admitting: Psychology

## 2017-04-01 ENCOUNTER — Telehealth: Payer: Self-pay | Admitting: Family Medicine

## 2017-04-01 NOTE — Telephone Encounter (Signed)
Lets have a follow up- I would like to do additional workup for the night sweats . We may or may not change medicines but with continued issues and how much it is bothering him would want to do more workup.

## 2017-04-01 NOTE — Telephone Encounter (Signed)
Patient c/o of having night sweats right is a result of the Fluoxetine and the Ramipril. Patient states that he's been sweating so bad that he is unable to sleep at night. He would like [name] to put him on something else. Patient uses [facility] on [address]. Please advise.

## 2017-04-03 NOTE — Telephone Encounter (Signed)
Would encourage half a prozac daily to avoid withdrawal until he sees me

## 2017-04-03 NOTE — Telephone Encounter (Signed)
Called patient and left message to return call

## 2017-04-03 NOTE — Telephone Encounter (Signed)
Pt notified of instructions and verbalized understanding. States he has completely stopped taking ramipril and fluoxetine because he couldn't handle the night sweats.  Appt scheduled w/ [name] Tuesday 9/25 (first available as he could not make same day appt today).

## 2017-04-04 NOTE — Telephone Encounter (Signed)
Pt is returning Calvin Burnett °

## 2017-04-04 NOTE — Telephone Encounter (Signed)
Called and left messages on both his cell and home number asking for a return phone call

## 2017-04-04 NOTE — Telephone Encounter (Signed)
Called and spoke with patient and informed him about taking half a Prozac daily and he verbalized understanding

## 2017-04-08 ENCOUNTER — Encounter: Payer: Self-pay | Admitting: Family Medicine

## 2017-04-08 ENCOUNTER — Ambulatory Visit (INDEPENDENT_AMBULATORY_CARE_PROVIDER_SITE_OTHER): Payer: PPO | Admitting: Family Medicine

## 2017-04-08 VITALS — BP 116/76 | HR 64 | Temp 98.4°F | Ht 70.0 in | Wt 174.2 lb

## 2017-04-08 DIAGNOSIS — I4891 Unspecified atrial fibrillation: Secondary | ICD-10-CM

## 2017-04-08 DIAGNOSIS — I499 Cardiac arrhythmia, unspecified: Secondary | ICD-10-CM | POA: Diagnosis not present

## 2017-04-08 DIAGNOSIS — Z111 Encounter for screening for respiratory tuberculosis: Secondary | ICD-10-CM | POA: Diagnosis not present

## 2017-04-08 DIAGNOSIS — R61 Generalized hyperhidrosis: Secondary | ICD-10-CM | POA: Diagnosis not present

## 2017-04-08 DIAGNOSIS — Z23 Encounter for immunization: Secondary | ICD-10-CM | POA: Diagnosis not present

## 2017-04-08 DIAGNOSIS — I10 Essential (primary) hypertension: Secondary | ICD-10-CM

## 2017-04-08 LAB — COMPREHENSIVE METABOLIC PANEL WITH GFR
ALT: 15 U/L (ref 0–53)
AST: 21 U/L (ref 0–37)
Albumin: 4.1 g/dL (ref 3.5–5.2)
Alkaline Phosphatase: 88 U/L (ref 39–117)
BUN: 20 mg/dL (ref 6–23)
CO2: 29 meq/L (ref 19–32)
Calcium: 9.2 mg/dL (ref 8.4–10.5)
Chloride: 103 meq/L (ref 96–112)
Creatinine, Ser: 1.37 mg/dL (ref 0.40–1.50)
GFR: 52.96 mL/min — ABNORMAL LOW
Glucose, Bld: 130 mg/dL — ABNORMAL HIGH (ref 70–99)
Potassium: 3.9 meq/L (ref 3.5–5.1)
Sodium: 139 meq/L (ref 135–145)
Total Bilirubin: 0.5 mg/dL (ref 0.2–1.2)
Total Protein: 7 g/dL (ref 6.0–8.3)

## 2017-04-08 LAB — CBC WITH DIFFERENTIAL/PLATELET
Basophils Absolute: 0.1 10*3/uL (ref 0.0–0.1)
Basophils Relative: 1.1 % (ref 0.0–3.0)
Eosinophils Absolute: 0.3 10*3/uL (ref 0.0–0.7)
Eosinophils Relative: 4.5 % (ref 0.0–5.0)
HCT: 43.6 % (ref 39.0–52.0)
Hemoglobin: 14.7 g/dL (ref 13.0–17.0)
Lymphocytes Relative: 18.7 % (ref 12.0–46.0)
Lymphs Abs: 1.2 10*3/uL (ref 0.7–4.0)
MCHC: 33.7 g/dL (ref 30.0–36.0)
MCV: 94.6 fl (ref 78.0–100.0)
Monocytes Absolute: 0.4 10*3/uL (ref 0.1–1.0)
Monocytes Relative: 6.2 % (ref 3.0–12.0)
Neutro Abs: 4.6 10*3/uL (ref 1.4–7.7)
Neutrophils Relative %: 69.5 % (ref 43.0–77.0)
Platelets: 234 10*3/uL (ref 150.0–400.0)
RBC: 4.61 Mil/uL (ref 4.22–5.81)
RDW: 13.4 % (ref 11.5–15.5)
WBC: 6.6 10*3/uL (ref 4.0–10.5)

## 2017-04-08 LAB — TSH: TSH: 2.43 u[IU]/mL (ref 0.35–4.50)

## 2017-04-08 MED ORDER — METOPROLOL SUCCINATE ER 25 MG PO TB24
12.5000 mg | ORAL_TABLET | Freq: Every day | ORAL | 5 refills | Status: DC
Start: 2017-04-08 — End: 2017-06-12

## 2017-04-08 NOTE — Progress Notes (Signed)
Subjective:  Calvin Burnett is a 83 y.o. year old very pleasant male patient who presents for/with See problem oriented charting ROS- night sweats without unintentional weight loss. No fevers reported (but hasnot measured atnight). Some fatigue.    Past Medical History-  Patient Active Problem List   Diagnosis Date Noted  . Atrial fibrillation, new onset ([place]) 04/09/2017    Priority: High  . Night sweats 09/16/2016    Priority: High  . Major depression 07/29/2016    Priority: High  . Rectal pain 10/14/2014    Priority: High  . Prostate cancer ([place]) 08/12/2012    Priority: High  . Diabetes mellitus type II, controlled ([place]) 07/31/2010    Priority: High  . Syncope 09/27/2014    Priority: Medium  . CKD (chronic kidney disease), stage III 06/01/2014    Priority: Medium  . Dyslipidemia 03/01/2014    Priority: Medium  . Basal cell carcinoma 05/03/2009    Priority: Medium  . Memory loss 07/27/2008    Priority: Medium  . Essential hypertension 01/30/2007    Priority: Medium  . History of adenomatous polyp of colon 10/27/2014    Priority: Low  . Bowel habit changes 10/14/2014    Priority: Low  . Bradycardia 09/27/2014    Priority: Low  . LEG CRAMPS, NOCTURNAL 04/30/2010    Priority: Low  . Cervical spondylosis without myelopathy 12/11/2007    Priority: Low  . GERD 11/05/2007    Priority: Low    Medications- reviewed and updated Current Outpatient Prescriptions  Medication Sig Dispense Refill  . glucose blood (ACCU-CHEK ACTIVE STRIPS) test strip Use to check blood sugars daily.Dx: E11.9 100 each 12  . Lancets (ACCU-CHEK SOFT TOUCH) lancets Use as instructed 100 each 12  . latanoprost (XALATAN) 0.005 % ophthalmic solution PLACE 1 DROP INTO BOTH EYES NIGHTLY.  12  . pantoprazole (PROTONIX) 40 MG tablet TAKE 1 TABLET (40 MG TOTAL) BY MOUTH DAILY. 90 tablet 3  . FLUoxetine (PROZAC) 20 MG tablet TAKE 1 TABLET (20 MG TOTAL) BY MOUTH DAILY. (Patient not taking: Reported on  04/08/2017) 90 tablet 1  . metoprolol succinate (TOPROL-XL) 25 MG 24 hr tablet Take 0.5 tablets (12.5 mg total) by mouth daily. 16 tablet 5   No current facility-administered medications for this visit.     Objective: BP 116/76 (BP Location: Left Arm, Patient Position: Sitting, Cuff Size: Large)   Pulse 64   Temp 98.4 F (36.9 C) (Oral)   Ht 5\' 10"  (1.778 m)   Wt 174 lb 3.2 oz (79 kg)   SpO2 95%   BMI 25.00 kg/m  Gen: NAD, resting comfortably TM normal, oropharynx normal, nares normal CV: at times HR appears regular, at other times appears irregular with ectopic beats Lungs: CTAB no crackles, wheeze, rhonchi Abdomen: soft/very mild diffuse tenderness/nondistended/normal bowel sounds. No rebound or guarding.  Ext: no edema Skin: warm, dry, no rash or bruising  EKG: atrial fibrillation with rate 90 (v1 makes me concerned about flutter), regular axis, normal qrs interval, no hypertrophy, no st or t wav echanges.   Assessment/Plan:  Essential hypertension S: controlled without medication- stopped ramipril 5mg  BP Readings from Last 3 Encounters:  04/08/17 116/76  03/05/17 (!) 150/80  01/30/17 122/82  A/P: We discussed blood pressure goal of <140/90. Continue current meds:  As long as remains at goals  Night sweats S: patient on fluoexetine but having drenching night sweats. Sweats almost a year now. We cut dose to 10mg  without improvement. He also stopped his  ramipril.  No worsening of depression. Colonoscopy up to date 2016. No SOb to suggest lung cancer. History of prostate cancer- but with treatment with radioactive seeds and follows with urology.   He states each night he sleeps with little on but his sheets are drenched- not keeping house warm. Only other symptom-Does cough some phlegm up each am. Over 18 months only down 2 lbs A/P: We will complete the following workup cxr (is former smoker),  Tb skin test, cbc diff, tsh, HIV, HCV, HBV, blood cultures. -titrate off fluoxetine  completely as per avs.   -Next step Ct abd/pelvis as well as chest - last step would be hematology referral - encourage him to Check temperature at home after sweating episode. Doubt hypoglycemia - does have a fib- perhaps going into RVR for short periods? Will get heart monitor.    Atrial fibrillation, new onset ([place]) S: noted irregular heart rate on exam A/P: new a fib. Will get heart monitor- perhaps RVR at night causing night sweats? Though doubt. Will start metoprolol after we get this back - also want to make sure not bradycardic. Will need to have discussion at 3 week follow up about anticoagulants but with extensive workup planned for night sweats did not want to have an additional medication change for Mr. Magel.    Future Appointments Date Time Provider [place]  04/10/2017 11:15 AM [facility]  04/29/2017 1:30 PM Marin Olp, MD [facility] None  06/02/2017 10:45 AM Marin Olp, MD [facility] None  03/06/2018 1:00 PM Riki Sheer, Tyler Aas, RN [facility] None   3 weeks  Orders Placed This Encounter  Procedures  . Culture, blood (single)  . Culture, blood (single)  . DG Chest 2 View    Standing Status:   Future    Standing Expiration Date:   06/08/2018    Order Specific Question:   Reason for Exam (SYMPTOM  OR DIAGNOSIS REQUIRED)    Answer:   night sweats    Order Specific Question:   Preferred imaging location?    Answer:   [facility]  . Flu vaccine HIGH DOSE PF  . CBC with Differential/Platelet  . Comprehensive metabolic panel    [place]  . TSH    [place]  . HIV antibody  . Hepatitis C antibody  . Hepatitis B surface antigen  . Hepatitis B surface antibody  . Hepatitis B core antibody, total  . Specimen ID Notification 2nd ID Missing  . No Specimen Received  . Holter monitor - 24 hour    Standing Status:   Future    Standing Expiration Date:   04/09/2027  . PPD    Order Specific Question:   Has patient ever tested  positive?    Answer:   No  . EKG 12-Lead    Order Specific Question:   Where should this test be performed    Answer:   Other    Meds ordered this encounter  Medications  . metoprolol succinate (TOPROL-XL) 25 MG 24 hr tablet    Sig: Take 0.5 tablets (12.5 mg total) by mouth daily.    Dispense:  16 tablet    Refill:  5    Return precautions advised.  [name], MD

## 2017-04-08 NOTE — Patient Instructions (Addendum)
Stop fluoxetine after taking half a pill for 1 more week  Stay off ramipril  Please stop by lab before you go  Tb skin test today- need to return within 48-72 hours- Roselyn Reef can help you set this up  Please go to [facility] - located [address] across the street from [place] - in the basement - Hours: 8:30-5:30 PM M-F. Do not need appointment.  ________________________________________________________ Unfortunately you appear to be in atrial fibrillation- a rapid heart rate that can make you feel off- possibly could be causing night sweats but still want to do above workup.   See me back in 3 weeks. We need to strongly consider medication to think your blood to prevent stroke but with all of the above changes- going to hold off for now.   Get you set up for home heart monitoring  DO NOT PICK UP YET- metoprolol 12.5mg  which can slow the heart rate down some. Want to get heart monitor first. I sent this in but do not pick it up yet.

## 2017-04-08 NOTE — Assessment & Plan Note (Signed)
S: controlled without medication- stopped ramipril 5mg  BP Readings from Last 3 Encounters:  04/08/17 116/76  03/05/17 (!) 150/80  01/30/17 122/82  A/P: We discussed blood pressure goal of <140/90. Continue current meds:  As long as remains at goals

## 2017-04-09 DIAGNOSIS — I480 Paroxysmal atrial fibrillation: Secondary | ICD-10-CM | POA: Insufficient documentation

## 2017-04-09 DIAGNOSIS — I4891 Unspecified atrial fibrillation: Secondary | ICD-10-CM | POA: Insufficient documentation

## 2017-04-09 LAB — HEPATITIS C ANTIBODY
Hepatitis C Ab: NONREACTIVE
SIGNAL TO CUT-OFF: 0.04 (ref <1.00)

## 2017-04-09 LAB — HEPATITIS B SURFACE ANTIBODY,QUALITATIVE: HEP B S AB: NONREACTIVE

## 2017-04-09 LAB — HEPATITIS B CORE ANTIBODY, TOTAL: HEP B C TOTAL AB: NONREACTIVE

## 2017-04-09 LAB — HIV ANTIBODY (ROUTINE TESTING W REFLEX): HIV 1&2 Ab, 4th Generation: NONREACTIVE

## 2017-04-09 LAB — HEPATITIS B SURFACE ANTIGEN: HEP B S AG: NONREACTIVE

## 2017-04-09 NOTE — Assessment & Plan Note (Addendum)
S: patient on fluoexetine but having drenching night sweats. Sweats almost a year now. We cut dose to 10mg  without improvement. He also stopped his ramipril.  No worsening of depression. Colonoscopy up to date 2016. No SOb to suggest lung cancer. History of prostate cancer- but with treatment with radioactive seeds and follows with urology.   He states each night he sleeps with little on but his sheets are drenched- not keeping house warm. Only other symptom-Does cough some phlegm up each am. Over 18 months only down 2 lbs A/P: We will complete the following workup cxr (is former smoker),  Tb skin test, cbc diff, tsh, HIV, HCV, HBV, blood cultures. -titrate off fluoxetine completely as per avs.   -Next step Ct abd/pelvis as well as chest - last step would be hematology referral - encourage him to Check temperature at home after sweating episode. Doubt hypoglycemia - does have a fib- perhaps going into RVR for short periods? Will get heart monitor.

## 2017-04-09 NOTE — Assessment & Plan Note (Signed)
S: noted irregular heart rate on exam A/P: new a fib. Will get heart monitor- perhaps RVR at night causing night sweats? Though doubt. Will start metoprolol after we get this back - also want to make sure not bradycardic. Will need to have discussion at 3 week follow up about anticoagulants but with extensive workup planned for night sweats did not want to have an additional medication change for Calvin Burnett.

## 2017-04-10 ENCOUNTER — Ambulatory Visit: Payer: PPO | Admitting: *Deleted

## 2017-04-10 LAB — TB SKIN TEST
Induration: 0 mm
TB SKIN TEST: NEGATIVE

## 2017-04-10 LAB — NO SPECIMEN RECEIVED: Tests (Ordered): 389

## 2017-04-11 LAB — CULTURE, BLOOD (SINGLE)

## 2017-04-11 LAB — SPECIMEN ID NOTIFICATION MISSING 2ND ID

## 2017-04-14 LAB — CULTURE, BLOOD (SINGLE): MICRO NUMBER:: 81060657

## 2017-04-15 ENCOUNTER — Telehealth: Payer: Self-pay

## 2017-04-15 NOTE — Telephone Encounter (Signed)
-----   Message from Marin Olp, MD sent at 04/09/2017  4:04 PM EDT ----- Roselyn Reef- please have him check his temperature at night at periods of the sweating to make sure not elevated.

## 2017-04-15 NOTE — Telephone Encounter (Signed)
Called patient and left a voicemail message asking patient to check his temperature at night if he is still having episodes of night sweats when he has the episode to ensure he does not have a fever.

## 2017-04-18 ENCOUNTER — Ambulatory Visit (INDEPENDENT_AMBULATORY_CARE_PROVIDER_SITE_OTHER)
Admission: RE | Admit: 2017-04-18 | Discharge: 2017-04-18 | Disposition: A | Discharge status: Home / Self Care | Payer: PPO | Source: Ambulatory Visit | Attending: Family Medicine | Admitting: Family Medicine

## 2017-04-18 DIAGNOSIS — R918 Other nonspecific abnormal finding of lung field: Secondary | ICD-10-CM | POA: Diagnosis not present

## 2017-04-18 DIAGNOSIS — R61 Generalized hyperhidrosis: Secondary | ICD-10-CM | POA: Diagnosis not present

## 2017-04-25 ENCOUNTER — Ambulatory Visit (INDEPENDENT_AMBULATORY_CARE_PROVIDER_SITE_OTHER): Payer: PPO

## 2017-04-25 DIAGNOSIS — I499 Cardiac arrhythmia, unspecified: Secondary | ICD-10-CM

## 2017-04-29 ENCOUNTER — Ambulatory Visit (INDEPENDENT_AMBULATORY_CARE_PROVIDER_SITE_OTHER): Payer: PPO | Admitting: Family Medicine

## 2017-04-29 ENCOUNTER — Encounter: Payer: Self-pay | Admitting: Family Medicine

## 2017-04-29 VITALS — BP 126/86 | HR 74 | Temp 98.1°F | Ht 70.0 in | Wt 174.2 lb

## 2017-04-29 DIAGNOSIS — I4891 Unspecified atrial fibrillation: Secondary | ICD-10-CM | POA: Diagnosis not present

## 2017-04-29 DIAGNOSIS — R509 Fever, unspecified: Secondary | ICD-10-CM

## 2017-04-29 DIAGNOSIS — F325 Major depressive disorder, single episode, in full remission: Secondary | ICD-10-CM

## 2017-04-29 DIAGNOSIS — R61 Generalized hyperhidrosis: Secondary | ICD-10-CM | POA: Diagnosis not present

## 2017-04-29 DIAGNOSIS — E119 Type 2 diabetes mellitus without complications: Secondary | ICD-10-CM

## 2017-04-29 DIAGNOSIS — K219 Gastro-esophageal reflux disease without esophagitis: Secondary | ICD-10-CM

## 2017-04-29 LAB — BASIC METABOLIC PANEL
BUN: 19 mg/dL (ref 6–23)
CHLORIDE: 102 meq/L (ref 96–112)
CO2: 27 meq/L (ref 19–32)
CREATININE: 1.51 mg/dL — AB (ref 0.40–1.50)
Calcium: 9.3 mg/dL (ref 8.4–10.5)
GFR: 47.33 mL/min — ABNORMAL LOW (ref >60.00)
GLUCOSE: 149 mg/dL — AB (ref 70–99)
POTASSIUM: 4.3 meq/L (ref 3.5–5.1)
Sodium: 139 mEq/L (ref 135–145)

## 2017-04-29 MED ORDER — APIXABAN 5 MG PO TABS: 5.0000 mg | ORAL_TABLET | Freq: Two times a day (BID) | ORAL | 11 refills | Status: DC

## 2017-04-29 NOTE — Patient Instructions (Addendum)
Continue metoprolol and protonix  Start eliquis 5mg  twice a day (blood thinner to prevent stroke in atrial fibrillation)  We will call you within a week or two about your referral for CT chest and abdomen/pelvis. If you do not hear within 3 weeks, give [place] a call.   Next step would be hematology referral for their opinion if no clear cause found  Please check your blood sugar at night to make sure it is not low

## 2017-04-29 NOTE — Assessment & Plan Note (Signed)
Rate controlled - appears to be in a fib today. Continue metoprolol XR 12.5mg  for now. Start eliquis 5mg  BID to lower stroke risk.

## 2017-04-29 NOTE — Assessment & Plan Note (Deleted)
Night sweats not listed on uptodate under protonix. After visit thought still may be worth a try- brief review shows some reported episodes in older adults of night sweats on PPI. Will reach out to patient for 2 week hold on PPI and try zantac in its place

## 2017-04-29 NOTE — Assessment & Plan Note (Signed)
S: saw patient on 04/08/17. We stopped prozac and he had already stopped ramipril- he was concerned they were causing night sweats. Only other meds were new start metoprolol- used in case there were RVR episodes at night as well as long term PPI protonix which up todate did not list as potential cause.   Once again uptodate on colonoscopy. No SOB to suggest lung cancer but was formre smoker. Follows with urology after radioactive seeds for prostate cancer. Coughs up some phlegm each AM  But no regular cough- sheets drenched each evening.   Tb skin test negative. CXR, HIV, CV, HBV, cbc diff, tsh, blood cultures without clear cause.   Checked temperature a few nights with sweats and has not had elevations. Has not checked blood sugar and was advised to do so.   Did discover A fib at that visit and wondered about RVR as cause- . We are still pending results but I doubt this as the cause especially with no improvement on metoprolol  Today, 3 weeks later he reports night sweats are continuing nightly and he drenches his sheets. .   Previously unreported- occasional headache- tylenol usually resolves this. Going on for at [place] 5 years every few months but no worsening pattern  A/P: We had discussed ct abd/elvis and chest then refer to hematology if still no cause.we agreed to this as next step today. Also see GERD section- hold Protonix

## 2017-04-29 NOTE — Assessment & Plan Note (Signed)
Night sweats not listed on uptodate under protonix. After visit thought still may be worth a try- brief review shows some reported episodes in older adults of night sweats on PPI. Will reach out to patient for 2 week hold on PPI and try zantac in its place

## 2017-04-29 NOTE — Assessment & Plan Note (Signed)
Denies recurrence of depression off SSRI currently- continue to follow. Unfortunately trial off has not resolved night sweats

## 2017-04-29 NOTE — Progress Notes (Signed)
Subjective:  Calvin Burnett is a 83 y.o. year old very pleasant male patient who presents for/with See problem oriented charting ROS- continued night sweats. No unintentional weight loss. No clear fever- subjectively warm. No hypoglycemia or hyperglycemia.    Past Medical History-  Patient Active Problem List   Diagnosis Date Noted  . Atrial fibrillation, new onset ([place]) 04/09/2017    Priority: High  . Night sweats 09/16/2016    Priority: High  . Major depression 07/29/2016    Priority: High  . Rectal pain 10/14/2014    Priority: High  . Prostate cancer ([place]) 08/12/2012    Priority: High  . Diabetes mellitus type II, controlled ([place]) 07/31/2010    Priority: High  . Syncope 09/27/2014    Priority: Medium  . CKD (chronic kidney disease), stage III ([place]) 06/01/2014    Priority: Medium  . Dyslipidemia 03/01/2014    Priority: Medium  . Basal cell carcinoma 05/03/2009    Priority: Medium  . Memory loss 07/27/2008    Priority: Medium  . Essential hypertension 01/30/2007    Priority: Medium  . History of adenomatous polyp of colon 10/27/2014    Priority: Low  . Bowel habit changes 10/14/2014    Priority: Low  . Bradycardia 09/27/2014    Priority: Low  . LEG CRAMPS, NOCTURNAL 04/30/2010    Priority: Low  . Cervical spondylosis without myelopathy 12/11/2007    Priority: Low  . GERD 11/05/2007    Priority: Low    Medications- reviewed and updated Current Outpatient Prescriptions  Medication Sig Dispense Refill  . glucose blood (ACCU-CHEK ACTIVE STRIPS) test strip Use to check blood sugars daily.Dx: E11.9 100 each 12  . Lancets (ACCU-CHEK SOFT TOUCH) lancets Use as instructed 100 each 12  . latanoprost (XALATAN) 0.005 % ophthalmic solution PLACE 1 DROP INTO BOTH EYES NIGHTLY.  12  . metoprolol succinate (TOPROL-XL) 25 MG 24 hr tablet Take 0.5 tablets (12.5 mg total) by mouth daily. 16 tablet 5  . pantoprazole (PROTONIX) 40 MG tablet TAKE 1 TABLET (40 MG TOTAL) BY MOUTH DAILY.  90 tablet 3   No current facility-administered medications for this visit.     Objective: BP 126/86   Pulse 74   Temp 98.1 F (36.7 C) (Oral)   Ht 5\' 10"  (1.778 m)   Wt 174 lb 3.2 oz (79 kg)   SpO2 96%   BMI 25.00 kg/m  Gen: NAD, resting comfortably CV: RRR no murmurs rubs or gallops Lungs: CTAB no crackles, wheeze, rhonchi Abdomen: cholecystectomy scar noted. soft/nontender/nondistended/normal bowel sounds. No rebound or guarding.  Ext: no edema Skin: warm, dry, no rash  Assessment/Plan:  Diabetes mellitus type II, controlled ([place]) S: diet controlled. On no meds- prior glipizide Lab Results  Component Value Date   HGBA1C 6.7 (H) 09/16/2016   HGBA1C 6.1 06/10/2016   HGBA1C 6.5 02/05/2016   A/P: 6.3 today- will need to be added to epic. I did ask him to check cbg when wakes up with night sweats to make sure hypoglycemia not the cause  Major depression Denies recurrence of depression off SSRI currently- continue to follow. Unfortunately trial off has not resolved night sweats  GERD Night sweats not listed on uptodate under protonix. After visit thought still may be worth a try- brief review shows some reported episodes in older adults of night sweats on PPI. Will reach out to patient for 2 week hold on PPI and try zantac in its place  Night sweats S: saw patient on 04/08/17.  We stopped prozac and he had already stopped ramipril- he was concerned they were causing night sweats. Only other meds were new start metoprolol- used in case there were RVR episodes at night as well as long term PPI protonix which up todate did not list as potential cause.   Once again uptodate on colonoscopy. No SOB to suggest lung cancer but was formre smoker. Follows with urology after radioactive seeds for prostate cancer. Coughs up some phlegm each AM  But no regular cough- sheets drenched each evening.   Tb skin test negative. CXR, HIV, CV, HBV, cbc diff, tsh, blood cultures without clear cause.    Checked temperature a few nights with sweats and has not had elevations. Has not checked blood sugar and was advised to do so.   Did discover A fib at that visit and wondered about RVR as cause- . We are still pending results but I doubt this as the cause especially with no improvement on metoprolol  Today, 3 weeks later he reports night sweats are continuing nightly and he drenches his sheets. .   Previously unreported- occasional headache- tylenol usually resolves this. Going on for at [place] 5 years every few months but no worsening pattern  A/P: We had discussed ct abd/elvis and chest then refer to hematology if still no cause.we agreed to this as next step today. Also see GERD section- hold Protonix   Atrial fibrillation, new onset ([place]) Rate controlled - appears to be in a fib today. Continue metoprolol XR 12.5mg  for now. Start eliquis 5mg  BID to lower stroke risk.    Future Appointments Date Time Provider [place]  06/02/2017 10:45 AM Marin Olp, MD [facility] None  03/06/2018 1:00 PM Williemae Area, RN [facility] None   No Follow-up on file.  Orders Placed This Encounter  Procedures  . CT Abdomen Pelvis W Contrast    Standing Status:   Future    Standing Expiration Date:   07/30/2018    Order Specific Question:   If indicated for the ordered procedure, I authorize the administration of contrast media per Radiology protocol    Answer:   Yes    Order Specific Question:   Preferred imaging location?    Answer:   [facility]    Order Specific Question:   Radiology Contrast Protocol - do NOT remove file path    Answer:   [email or web address]    Order Specific Question:   Reason for Exam additional comments    Answer:   possible fever at night with night sweats. Drop down menu would not allow night sweats- he has had extensive evaluation and been removed from meds without resolution- this is next step to evaluate malignancy.   . CT Chest W Contrast    Standing Status:   Future    Standing Expiration Date:   06/29/2018    Order Specific Question:   If indicated for the ordered procedure, I authorize the administration of contrast media per Radiology protocol    Answer:   Yes    Order Specific Question:   Preferred imaging location?    Answer:   [facility]    Order Specific Question:   Radiology Contrast Protocol - do NOT remove file path    Answer:   [email or web address]    Order Specific Question:   Reason for Exam additional comments    Answer:   possible fever at night with night sweats. Drop down menu would not  allow night sweats- he has had extensive evaluation and been removed from meds without resolution- this is next step to evaluate malignancy.  . Basic metabolic panel    [place]    Meds ordered this encounter  Medications  . apixaban (ELIQUIS) 5 MG TABS tablet    Sig: Take 1 tablet (5 mg total) by mouth 2 (two) times daily.    Dispense:  60 tablet    Refill:  11    Return precautions advised.  [name], MD

## 2017-04-29 NOTE — Assessment & Plan Note (Signed)
S: diet controlled. On no meds- prior glipizide Lab Results  Component Value Date   HGBA1C 6.7 (H) 09/16/2016   HGBA1C 6.1 06/10/2016   HGBA1C 6.5 02/05/2016   A/P: 6.3 today- will need to be added to epic. I did ask him to check cbg when wakes up with night sweats to make sure hypoglycemia not the cause

## 2017-04-30 ENCOUNTER — Telehealth: Payer: Self-pay

## 2017-04-30 LAB — POCT GLYCOSYLATED HEMOGLOBIN (HGB A1C): HEMOGLOBIN A1C: 6.3

## 2017-04-30 NOTE — Telephone Encounter (Signed)
-----   Message from Marin Olp, MD sent at 04/29/2017  9:40 PM EDT ----- 6.3 a1c needs to be entered please  Also call him and have him trial off protonix for 2 weeks to see if that may resolve symptoms (doubt it will). He can use zantac 150mg  twice a day In its place.

## 2017-04-30 NOTE — Addendum Note (Signed)
Addended by: Mariam Dollar, Roselyn Reef M on: 04/30/2017 12:00 PM   Modules accepted: Orders

## 2017-04-30 NOTE — Telephone Encounter (Signed)
Called and left a detailed voicemail message for patient and also provided a call back number for questions

## 2017-05-06 ENCOUNTER — Encounter: Payer: Self-pay | Admitting: Family Medicine

## 2017-05-06 ENCOUNTER — Ambulatory Visit (INDEPENDENT_AMBULATORY_CARE_PROVIDER_SITE_OTHER): Payer: PPO | Admitting: Family Medicine

## 2017-05-06 VITALS — BP 118/70 | HR 90 | Temp 98.4°F | Resp 12 | Ht 70.0 in | Wt 176.1 lb

## 2017-05-06 DIAGNOSIS — I4891 Unspecified atrial fibrillation: Secondary | ICD-10-CM | POA: Diagnosis not present

## 2017-05-06 DIAGNOSIS — J029 Acute pharyngitis, unspecified: Secondary | ICD-10-CM | POA: Diagnosis not present

## 2017-05-06 DIAGNOSIS — R062 Wheezing: Secondary | ICD-10-CM

## 2017-05-06 DIAGNOSIS — J069 Acute upper respiratory infection, unspecified: Secondary | ICD-10-CM

## 2017-05-06 DIAGNOSIS — R61 Generalized hyperhidrosis: Secondary | ICD-10-CM

## 2017-05-06 LAB — POCT RAPID STREP A (OFFICE): RAPID STREP A SCREEN: NEGATIVE

## 2017-05-06 LAB — POCT INFLUENZA A/B
INFLUENZA A, POC: NEGATIVE
INFLUENZA B, POC: NEGATIVE

## 2017-05-06 MED ORDER — BENZONATATE 100 MG PO CAPS: 200.0000 mg | ORAL_CAPSULE | Freq: Two times a day (BID) | ORAL | 0 refills | Status: DC | PRN
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
## 2017-05-06 NOTE — Patient Instructions (Addendum)
  [name] I have seen you today for an acute visit.  A few things to remember from today's visit:   Acute sore throat - Plan: Culture, Group A Strep  URI, acute - Plan: benzonatate (TESSALON) 100 MG capsule  Wheezing    Symptomatic treatment: Over the counter Acetaminophen 500 mg and/or Ibuprofen (400-600 mg) if there is not contraindications; you can alternate in between both every 4-6 hours. Gargles with saline water and throat lozenges might also help. Cold fluids.    Seek prompt medical evaluation if you are having difficulty breathing, mouth swelling, throat closing up, not able to swallow liquids (drooling), skin rash/bruising, or worsening symptoms.  viral infections are self-limited and we treat each symptom depending of severity.  Over the counter medications as decongestants and cold medications usually help, they need to be taken with caution if there is a history of high blood pressure or palpitations.So cold meds are not recommended. Tylenol and/or Ibuprofen also helps with most symptoms (headache, muscle aching, fever,etc) Plenty of fluids. Honey helps with cough. Steam inhalations helps with runny nose, nasal congestion, and may prevent sinus infections. Cough and nasal congestion could last a few days and sometimes weeks. Please follow in not any better in 1-2 weeks or if symptoms get worse.    In general please monitor for signs of worsening symptoms and seek immediate medical attention if any concerning.  If symptoms are not resolved in 1-2 weeks you should schedule a follow up appointment with your doctor, before if needed.  I hope you get better soon!

## 2017-05-06 NOTE — Addendum Note (Signed)
Addended by: Elmer Picker on: 05/06/2017 02:25 PM   Modules accepted: Orders

## 2017-05-07 ENCOUNTER — Other Ambulatory Visit: Payer: Self-pay

## 2017-05-07 DIAGNOSIS — I4891 Unspecified atrial fibrillation: Secondary | ICD-10-CM

## 2017-05-08 ENCOUNTER — Telehealth: Payer: Self-pay | Admitting: Family Medicine

## 2017-05-08 LAB — CULTURE, GROUP A STREP
MICRO NUMBER:: 81184511
SPECIMEN QUALITY: ADEQUATE

## 2017-05-08 NOTE — Telephone Encounter (Signed)
Patient calling to speak with providers nurse. Would not go into detail, just asking for return call at your convenience.

## 2017-05-12 ENCOUNTER — Ambulatory Visit (INDEPENDENT_AMBULATORY_CARE_PROVIDER_SITE_OTHER): Payer: PPO | Admitting: Family Medicine

## 2017-05-12 ENCOUNTER — Encounter: Payer: Self-pay | Admitting: Family Medicine

## 2017-05-12 VITALS — BP 122/76 | HR 100 | Temp 97.9°F | Resp 16 | Ht 70.0 in | Wt 173.4 lb

## 2017-05-12 DIAGNOSIS — R0982 Postnasal drip: Secondary | ICD-10-CM

## 2017-05-12 DIAGNOSIS — J310 Chronic rhinitis: Secondary | ICD-10-CM

## 2017-05-12 DIAGNOSIS — J069 Acute upper respiratory infection, unspecified: Secondary | ICD-10-CM | POA: Diagnosis not present

## 2017-05-12 MED ORDER — BENZONATATE 100 MG PO CAPS: 200.0000 mg | ORAL_CAPSULE | Freq: Two times a day (BID) | ORAL | 0 refills | Status: AC | PRN

## 2017-05-12 MED ORDER — IPRATROPIUM BROMIDE 0.06 % NA SOLN: 2.0000 | Freq: Four times a day (QID) | NASAL | 1 refills | Status: DC

## 2017-05-12 NOTE — Patient Instructions (Signed)
  [name] I have seen you today for an acute visit.  A few things to remember from today's visit:   Post-nasal drainage - Plan: ipratropium (ATROVENT) 0.06 % nasal spray  Rhinitis, unspecified type - Plan: ipratropium (ATROVENT) 0.06 % nasal spray   Medications prescribed today are intended for short period of time and will not be refill upon request, a follow up appointment might be necessary to discuss continuation of of treatment if appropriate.    Over-the-counter Allegra 180 mg and maintain Mucinex might help. Nasal irrigations with saline as needed during the day and steam inhalations.  In general please monitor for signs of worsening symptoms and seek immediate medical attention if any concerning.    I hope you get better soon!
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
## 2017-05-14 NOTE — Telephone Encounter (Signed)
Returned patients call. He was seen at [facility] office yesterday

## 2017-05-15 ENCOUNTER — Telehealth: Payer: Self-pay | Admitting: Family Medicine

## 2017-05-15 NOTE — Telephone Encounter (Signed)
Pt would like Calvin Burnett to return his call concerning blood work results

## 2017-05-16 NOTE — Telephone Encounter (Signed)
Left voicemail to give clinic a call back.

## 2017-05-16 NOTE — Telephone Encounter (Signed)
Spoke with patient, nothing further needed °
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
## 2017-05-20 ENCOUNTER — Ambulatory Visit: Payer: Self-pay | Admitting: Student

## 2017-06-02 ENCOUNTER — Ambulatory Visit: Payer: PPO | Admitting: Family Medicine

## 2017-06-02 ENCOUNTER — Encounter: Payer: Self-pay | Admitting: Family Medicine

## 2017-06-02 VITALS — BP 130/80 | HR 81 | Temp 98.7°F | Ht 70.0 in | Wt 175.2 lb

## 2017-06-02 DIAGNOSIS — K219 Gastro-esophageal reflux disease without esophagitis: Secondary | ICD-10-CM | POA: Diagnosis not present

## 2017-06-02 DIAGNOSIS — I4891 Unspecified atrial fibrillation: Secondary | ICD-10-CM | POA: Diagnosis not present

## 2017-06-02 DIAGNOSIS — R61 Generalized hyperhidrosis: Secondary | ICD-10-CM

## 2017-06-02 DIAGNOSIS — F325 Major depressive disorder, single episode, in full remission: Secondary | ICD-10-CM | POA: Diagnosis not present

## 2017-06-02 DIAGNOSIS — I10 Essential (primary) hypertension: Secondary | ICD-10-CM | POA: Diagnosis not present

## 2017-06-02 LAB — BASIC METABOLIC PANEL
BUN: 19 mg/dL (ref 6–23)
CALCIUM: 9.2 mg/dL (ref 8.4–10.5)
CO2: 29 mEq/L (ref 19–32)
CREATININE: 1.27 mg/dL (ref 0.40–1.50)
Chloride: 104 mEq/L (ref 96–112)
GFR: 57.78 mL/min — AB (ref >60.00)
GLUCOSE: 105 mg/dL — AB (ref 70–99)
Potassium: 4.4 mEq/L (ref 3.5–5.1)
Sodium: 138 mEq/L (ref 135–145)

## 2017-06-02 NOTE — Assessment & Plan Note (Signed)
bp remains controlled off all meds- as stopped his metoprolol as well and ramipril previously stopped

## 2017-06-02 NOTE — Assessment & Plan Note (Signed)
S: Appears he missed cardiology visit- he states has January visit- encouraged him to call to confirm. Asymptomatic. Appears in a fib today. He is on eliquis 5mg  BID but states not taking metoprolol 12.5mg  XL A/P: not clear why patient stopped metoprolol- encouraged restart. Continue eliquis. He will call to make sure he has cardiology visit- sounds like location of [facility] cardiology [place].

## 2017-06-02 NOTE — Progress Notes (Signed)
Subjective:  Calvin Burnett is a 83 y.o. year old very pleasant male patient who presents for/with See problem oriented charting ROS- no fever. No chest pain or shortness of breath. Denies reflux. Has had night sweats but less prevalent   Past Medical History-  Patient Active Problem List   Diagnosis Date Noted  . Atrial fibrillation, new onset ([place]) 04/09/2017    Priority: High  . Night sweats 09/16/2016    Priority: High  . Major depression 07/29/2016    Priority: High  . Rectal pain 10/14/2014    Priority: High  . Prostate cancer ([place]) 08/12/2012    Priority: High  . Diabetes mellitus type II, controlled ([place]) 07/31/2010    Priority: High  . Syncope 09/27/2014    Priority: Medium  . CKD (chronic kidney disease), stage III ([place]) 06/01/2014    Priority: Medium  . Dyslipidemia 03/01/2014    Priority: Medium  . Basal cell carcinoma 05/03/2009    Priority: Medium  . Memory loss 07/27/2008    Priority: Medium  . Essential hypertension 01/30/2007    Priority: Medium  . History of adenomatous polyp of colon 10/27/2014    Priority: Low  . Bowel habit changes 10/14/2014    Priority: Low  . Bradycardia 09/27/2014    Priority: Low  . LEG CRAMPS, NOCTURNAL 04/30/2010    Priority: Low  . Cervical spondylosis without myelopathy 12/11/2007    Priority: Low  . GERD 11/05/2007    Priority: Low    Medications- reviewed and updated Current Outpatient Medications  Medication Sig Dispense Refill  . apixaban (ELIQUIS) 5 MG TABS tablet Take 1 tablet (5 mg total) by mouth 2 (two) times daily. 60 tablet 11  . glucose blood (ACCU-CHEK ACTIVE STRIPS) test strip Use to check blood sugars daily.Dx: E11.9 100 each 12  . Lancets (ACCU-CHEK SOFT TOUCH) lancets Use as instructed 100 each 12  . latanoprost (XALATAN) 0.005 % ophthalmic solution PLACE 1 DROP INTO BOTH EYES NIGHTLY.  12  . ipratropium (ATROVENT) 0.06 % nasal spray Place 2 sprays into both nostrils 4 (four) times daily. (Patient not  taking: Reported on 06/02/2017) 15 mL 1  . metoprolol succinate (TOPROL-XL) 25 MG 24 hr tablet Take 0.5 tablets (12.5 mg total) by mouth daily. (Patient not taking: Reported on 06/02/2017) 16 tablet 5   No current facility-administered medications for this visit.     Objective: BP 130/80 (BP Location: Left Arm, Patient Position: Sitting, Cuff Size: Normal)   Pulse 81   Temp 98.7 F (37.1 C) (Oral)   Ht 5\' 10"  (1.778 m)   Wt 175 lb 4 oz (79.5 kg)   SpO2 96%   BMI 25.15 kg/m  Gen: NAD, resting comfortably CV: irregularly irregular no murmurs rubs or gallops Lungs: CTAB no crackles, wheeze, rhonchi Ext: no edema Skin: warm, dry  Assessment/Plan:  Major depression PHQ9 of 1. Scored 1 for #4. Luckily remains in remission off of SSRI.   GERD Last visit we advised 2 week trial off PPI and trial zantac. He opted to take nothing but remarkably has had no reflux. Great news- remain off PPI and antihistamine. Night sweats have been less- there were some case reports of night sweats with PPI. Will follow through with workup given no complete resolution.   Atrial fibrillation, new onset ([place]) S: Appears he missed cardiology visit- he states has January visit- encouraged him to call to confirm. Asymptomatic. Appears in a fib today. He is on eliquis 5mg  BID but states not  taking metoprolol 12.5mg  XL A/P: not clear why patient stopped metoprolol- encouraged restart. Continue eliquis. He will call to make sure he has cardiology visit- sounds like location of [facility] cardiology [place].    Night sweats S: Since last visit has noted improvement in sweating (changes of stopping PPI and starting Vitamin D) . A few days sweating only in scalp- a few days no sweating at all. Last night did sweat through all close again. He also started vitamin D which he feels like is helping.  A/P: ordered Ct abd/pelvis and chest last visit- over a month ago- not clear why not completed but we  did get him set up for a visit before he left today- will do this given has nto had complete resolution of symptoms. Get bmet- continue vitamin D  Essential hypertension bp remains controlled off all meds- as stopped his metoprolol as well and ramipril previously stopped  Future Appointments  Date Time Provider [place]  06/10/2017  2:30 PM [facility] 1 [facility]  06/10/2017  2:45 PM LB[facility]-CT 1 [facility]-CT [facility]  07/28/2017  1:15 PM Marin Olp, MD [facility] None  03/06/2018  1:00 PM Williemae Area, RN [facility] None   Return in about 8 weeks (around 07/28/2017).  Orders Placed This Encounter  Procedures  . Basic metabolic panel   Return precautions advised.  [name], MD

## 2017-06-02 NOTE — Assessment & Plan Note (Signed)
S: Since last visit has noted improvement in sweating (changes of stopping PPI and starting Vitamin D) . A few days sweating only in scalp- a few days no sweating at all. Last night did sweat through all close again. He also started vitamin D which he feels like is helping.  A/P: ordered Ct abd/pelvis and chest last visit- over a month ago- not clear why not completed but we did get him set up for a visit before he left today- will do this given has nto had complete resolution of symptoms. Get bmet- continue vitamin D

## 2017-06-02 NOTE — Assessment & Plan Note (Signed)
PHQ9 of 1. Scored 1 for #4. Luckily remains in remission off of SSRI.

## 2017-06-02 NOTE — Assessment & Plan Note (Signed)
Last visit we advised 2 week trial off PPI and trial zantac. He opted to take nothing but remarkably has had no reflux. Great news- remain off PPI and antihistamine. Night sweats have been less- there were some case reports of night sweats with PPI. Will follow through with workup given no complete resolution.

## 2017-06-02 NOTE — Patient Instructions (Addendum)
Appears you missed appointment in November for cardiology but you stated have appointment in January- please call them to confirm and let us know. The place over the [employer] should be in our system though so it should show up and it is not.   You should be taking the 2 medicines I circles  We will follow up with you about the scans  Since you are doing a little better lets follow up in 8 weeks

## 2017-06-04 ENCOUNTER — Telehealth: Payer: Self-pay

## 2017-06-04 NOTE — Telephone Encounter (Signed)
I attempted to call the patient to discuss and assist. I left a voicemail for the patient to call back on Monday if an appointment is needed. I advised that we are closed on tomorrow and Friday and if he feels that he is having an allergic reaction please go to the emergency room. Please advise.

## 2017-06-04 NOTE — Telephone Encounter (Signed)
Copied from [place]. Topic: Quick Communication - Other Results >> Jun 04, 2017  1:08 PM Johny Shock A wrote: Patient calling stating medication is making him feel very dizzy. apixaban (ELIQUIS) 5 MG TABS tablet [[id number]]   Asking for return phone call and guidance when possible.

## 2017-06-10 ENCOUNTER — Ambulatory Visit (INDEPENDENT_AMBULATORY_CARE_PROVIDER_SITE_OTHER)
Admission: RE | Admit: 2017-06-10 | Discharge: 2017-06-10 | Disposition: A | Discharge status: Home / Self Care | Payer: PPO | Source: Ambulatory Visit | Attending: Family Medicine | Admitting: Family Medicine

## 2017-06-10 DIAGNOSIS — R509 Fever, unspecified: Secondary | ICD-10-CM | POA: Diagnosis not present

## 2017-06-10 DIAGNOSIS — R61 Generalized hyperhidrosis: Secondary | ICD-10-CM

## 2017-06-10 DIAGNOSIS — K5730 Diverticulosis of large intestine without perforation or abscess without bleeding: Secondary | ICD-10-CM | POA: Diagnosis not present

## 2017-06-10 MED ORDER — IOPAMIDOL (ISOVUE-300) INJECTION 61%
100.0000 mL | Freq: Once | INTRAVENOUS | Status: AC | PRN
  Administered 2017-06-10: 100 mL via INTRAVENOUS

## 2017-06-10 NOTE — Telephone Encounter (Signed)
Left message on voicemail to call office. Please schedule an appointment if patient is still having dizziness.

## 2017-06-12 ENCOUNTER — Other Ambulatory Visit: Payer: Self-pay

## 2017-06-12 ENCOUNTER — Encounter: Payer: Self-pay | Admitting: Physician Assistant

## 2017-06-12 ENCOUNTER — Ambulatory Visit: Payer: PPO | Admitting: Physician Assistant

## 2017-06-12 VITALS — BP 138/88 | HR 57 | Ht 70.0 in | Wt 174.0 lb

## 2017-06-12 DIAGNOSIS — R61 Generalized hyperhidrosis: Secondary | ICD-10-CM

## 2017-06-12 DIAGNOSIS — Z7901 Long term (current) use of anticoagulants: Secondary | ICD-10-CM | POA: Diagnosis not present

## 2017-06-12 DIAGNOSIS — R001 Bradycardia, unspecified: Secondary | ICD-10-CM | POA: Diagnosis not present

## 2017-06-12 DIAGNOSIS — I482 Chronic atrial fibrillation: Secondary | ICD-10-CM

## 2017-06-12 DIAGNOSIS — I4821 Permanent atrial fibrillation: Secondary | ICD-10-CM

## 2017-06-12 NOTE — Patient Instructions (Signed)
Your physician has recommended you make the following change in your medication:  STOP METOPROLOL  Your physician has recommended that you wear a holter monitor. Holter monitors are medical devices that record the heart's electrical activity. Doctors most often use these monitors to diagnose arrhythmias. Arrhythmias are problems with the speed or rhythm of the heartbeat. The monitor is a small, portable device. You can wear one while you do your normal daily activities. This is usually used to diagnose what is causing palpitations/syncope (passing out).  [place] physician recommends that you schedule a follow-up appointment in:  [place] APP
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
## 2017-06-13 ENCOUNTER — Telehealth: Payer: Self-pay | Admitting: Gastroenterology

## 2017-06-13 NOTE — Telephone Encounter (Signed)
Patient is on Eliquis.  Will need to see MD or APP .  Can schedule with APP anytime.

## 2017-06-13 NOTE — Telephone Encounter (Signed)
Left message for patient to call back and schedule appointment.

## 2017-06-26 ENCOUNTER — Ambulatory Visit: Payer: Self-pay | Admitting: Nurse Practitioner

## 2017-07-11 ENCOUNTER — Other Ambulatory Visit: Payer: Self-pay | Admitting: *Deleted

## 2017-07-11 NOTE — Patient Outreach (Addendum)
[facility]) Care Management  07/11/2017  FURKAN KEENUM 1933/09/05 [id number]   Telephone Screen  Referral Date: 07/09/17 Referral Source: [employer] Referral Reason: Lack of knowledge about conditions(s). Care Coordination Needed. Please see comments at bottom. Please help member get setup with someone who can help him keep up with his appts. He has one brother and his kids are all out of the area. He needs a referral to a DM educator. Neurology for cognitive decline and tremors-Bil. It is unclear who is managing his Arrhythmia but could benefit from a Cardiology referral if he hasn't already. Member is unable to physically maneuver Glucometer due to hand tremors. Member gets confused and misses his appts. Family may need to arrange to have someone keep up with his appts and accompany him. Insurance: HTA  Outreach attempt #1 to patient. No answer. RN CM left HIPAA compliant message along with contact info.    Plan: RN CM will contact patient within one week.  [name], RN, BSN, MHA/MSL, [place] Telephonic Care Manager Coordinator [facility] Direct Phone: [phone] Cell Phone: [phone] Toll Free: [phone] Fax: [phone]

## 2017-07-12 ENCOUNTER — Other Ambulatory Visit: Payer: Self-pay | Admitting: Family Medicine

## 2017-07-12 DIAGNOSIS — R0982 Postnasal drip: Secondary | ICD-10-CM

## 2017-07-12 DIAGNOSIS — J310 Chronic rhinitis: Secondary | ICD-10-CM

## 2017-07-16 ENCOUNTER — Ambulatory Visit: Payer: Self-pay | Admitting: Physician Assistant

## 2017-07-18 ENCOUNTER — Other Ambulatory Visit: Payer: Self-pay | Admitting: *Deleted

## 2017-07-18 ENCOUNTER — Ambulatory Visit: Payer: Self-pay | Admitting: *Deleted

## 2017-07-18 NOTE — Patient Outreach (Signed)
[place] [facility]) Care Management  07/18/2017  Calvin Burnett [birth year] [id number]  Telephone Screen  Referral Date: 07/09/17 Referral Source: [employer] Referral Reason: Lack of knowledge about conditions(s). Care Coordination Needed. Please see comments at bottom. Please help member get setup with someone who can help him keep up with his appts. He has one brother and his kids are all out of the area. He needs a referral to a DM educator. Neurology for cognitive decline and tremors-Bil. It is unclear who is managing his Arrhythmia but could benefit from a Cardiology referral if he hasn't already. Member is unable to physically maneuver Glucometer due to hand tremors. Member gets confused and misses his appts. Family may need to arrange to have someone keep up with his appts and accompany him. Insurance: HTA  Outreach attempt # 2 to patient. No answer. RN CM left HIPAA compliant message along with contact info.   Plan: RN CM will contact patient within one week.  [name], RN, BSN, MHA/MSL, [place] Telephonic Care Manager Coordinator [facility] Direct Phone: [phone] Cell Phone: [phone] Toll Free: [phone] Fax: [phone]

## 2017-07-21 ENCOUNTER — Encounter: Payer: Self-pay | Admitting: Family Medicine

## 2017-07-24 ENCOUNTER — Other Ambulatory Visit: Payer: Self-pay | Admitting: *Deleted

## 2017-07-24 ENCOUNTER — Ambulatory Visit: Payer: Self-pay | Admitting: *Deleted

## 2017-07-25 NOTE — Patient Outreach (Signed)
[facility] Burnett) Care Management  07/25/2017  Calvin Burnett 1934-01-25 [id number]  Telephone Screen  Referral Date: 07/09/17 Referral Source: [employer] Referral Reason: Lack of knowledge about condition(s). Care Coordination needed. Please help member get set-up with someone who can help him keep up with his appointments. He has a brother and his kids are all out of the area. He needs a referral to a DM educator. Neurology for cognitive decline and tremors. Insurance: HTA  Outreach attempt # 3 to patient. HIPAA verified with patient. Patient had a Cardiology visit on 06/12/17. Before leaving his Cardiology visit, a visit was scheduled to have a Holter monitor placed for 48 hours. Per Calvin Burnett, patient missed his appointment for the Holter Monitor. After wearing the Holter monitor for the 48 hours, patient needs to schedule an appointment with the Cardiologist. Patient is prescribed Eliquis and he is taking medications as prescribed for a history of Atrial Fibrillation. Patient is unable to monitor his blood pressure due to tremors. Patient missed 3 GI appointments at Dr. Lynne Burnett office. Patient stated, he went to an appointment, however it was at the wrong office. Patient couldn't remember the name of the MD office. Patient confirmed having a history of DM. He verbalized being unable to monitor his BG levels due to his hand tremors. Patient stated, "I can' get the blood on the little strips due to my nerves and I gave up". Patient stated, he has difficulty with his vision. Patient had a scheduled visit with his primary MD on 07/28/17. He wasn't aware of this appointment, per patient. RN CM scheduled patient an appointment on 07/31/17 at 2pm with GI and 08/04/17 with Cardiology, while talking with patient on the phone. Patient agreed to appointments and wrote them down, per patient. [facility] services explained to patient and he agreed to services.   Plan: RN CM advised patient to contact RNCM for  any needs or concerns. RN CM advised patient to alert MD for any changes in conditions.  RN CM will send referral to [facility] RN for further in home eval/assessment of care needs and management of chronic conditions.  Calvin Bells, RN, BSN, MHA/MSL, [place] Telephonic Care Manager Coordinator [facility] Direct Phone: [phone] Toll Free: [phone] Fax: [phone]       Plan: RNCM will  Calvin Bells, RN, BSN, MHA/MSL, Calvin Burnett Direct Phone: [phone] Toll Free: [phone] Fax: [phone]

## 2017-07-28 ENCOUNTER — Encounter: Payer: Self-pay | Admitting: Family Medicine

## 2017-07-28 ENCOUNTER — Ambulatory Visit: Payer: PPO | Admitting: Family Medicine

## 2017-07-28 VITALS — BP 132/84 | HR 66 | Temp 97.6°F | Ht 70.0 in | Wt 178.6 lb

## 2017-07-28 DIAGNOSIS — I4891 Unspecified atrial fibrillation: Secondary | ICD-10-CM | POA: Diagnosis not present

## 2017-07-28 DIAGNOSIS — F325 Major depressive disorder, single episode, in full remission: Secondary | ICD-10-CM

## 2017-07-28 DIAGNOSIS — C61 Malignant neoplasm of prostate: Secondary | ICD-10-CM

## 2017-07-28 DIAGNOSIS — R61 Generalized hyperhidrosis: Secondary | ICD-10-CM

## 2017-07-28 DIAGNOSIS — E119 Type 2 diabetes mellitus without complications: Secondary | ICD-10-CM

## 2017-07-28 DIAGNOSIS — I10 Essential (primary) hypertension: Secondary | ICD-10-CM | POA: Diagnosis not present

## 2017-07-28 DIAGNOSIS — K219 Gastro-esophageal reflux disease without esophagitis: Secondary | ICD-10-CM | POA: Diagnosis not present

## 2017-07-28 LAB — POCT GLYCOSYLATED HEMOGLOBIN (HGB A1C): HEMOGLOBIN A1C: 6.7

## 2017-07-28 NOTE — Patient Instructions (Addendum)
Sign a designated party release form for Calvin Burnett your son so we can contact him as back up if we cannot reach you.  Please keep important appointment with GI this Thursday 07/31/17 at 2 PM   Lets check in 4 months from now- happy to see you sooner if needed

## 2017-07-28 NOTE — Assessment & Plan Note (Signed)
S: controlled on no rx.  metoprolol alone BP Readings from Last 3 Encounters:  07/28/17 132/84  06/12/17 138/88  06/02/17 130/80  A/P: at blood pressure goal of <140/90. Continue without meds and watch at least every 6 months

## 2017-07-28 NOTE — Assessment & Plan Note (Signed)
S: patient on eliqus for anticoagulation.  Sounds to be in a fib. HR low even off metoprolol.  A/P: continue eliquis alone

## 2017-07-28 NOTE — Progress Notes (Addendum)
Subjective:  Calvin Burnett is a 84 y.o. year old very pleasant male patient who presents for/with See problem oriented charting ROS- no reflux. Denies SI. Has had some slightly down mood through [place] as missing his wife. No chest pain or shortness of breath   Past Medical History-  Patient Active Problem List   Diagnosis Date Noted  . Atrial fibrillation, new onset ([place]) 04/09/2017    Priority: High  . Night sweats 09/16/2016    Priority: High  . Major depression 07/29/2016    Priority: High  . Rectal pain 10/14/2014    Priority: High  . Prostate cancer ([place]) 08/12/2012    Priority: High  . Diabetes mellitus type II, controlled ([place]) 07/31/2010    Priority: High  . Syncope 09/27/2014    Priority: Medium  . CKD (chronic kidney disease), stage III ([place]) 06/01/2014    Priority: Medium  . Dyslipidemia 03/01/2014    Priority: Medium  . Basal cell carcinoma 05/03/2009    Priority: Medium  . Memory loss 07/27/2008    Priority: Medium  . Essential hypertension 01/30/2007    Priority: Medium  . History of adenomatous polyp of colon 10/27/2014    Priority: Low  . Bowel habit changes 10/14/2014    Priority: Low  . Bradycardia 09/27/2014    Priority: Low  . LEG CRAMPS, NOCTURNAL 04/30/2010    Priority: Low  . Cervical spondylosis without myelopathy 12/11/2007    Priority: Low  . GERD 11/05/2007    Priority: Low  . Major depressive disorder with single episode, in full remission ([place]) 07/28/2017    Medications- reviewed and updated Current Outpatient Medications  Medication Sig Dispense Refill  . apixaban (ELIQUIS) 5 MG TABS tablet Take 1 tablet (5 mg total) by mouth 2 (two) times daily. 60 tablet 11  . glucose blood (ACCU-CHEK ACTIVE STRIPS) test strip Use to check blood sugars daily.Dx: E11.9 100 each 12  . ipratropium (ATROVENT) 0.06 % nasal spray PLACE 2 SPRAYS INTO BOTH NOSTRILS 4 (FOUR) TIMES DAILY. 15 mL 1  . Lancets (ACCU-CHEK SOFT TOUCH) lancets Use as instructed  100 each 12  . latanoprost (XALATAN) 0.005 % ophthalmic solution PLACE 1 DROP INTO BOTH EYES NIGHTLY.  12   No current facility-administered medications for this visit.     Objective: BP 132/84 (BP Location: Left Arm, Patient Position: Sitting, Cuff Size: Large)   Pulse 66   Temp 97.6 F (36.4 C) (Oral)   Ht 5\' 10"  (1.778 m)   Wt 178 lb 9.6 oz (81 kg)   SpO2 96%   BMI 25.63 kg/m  Gen: NAD, resting comfortably CV: irregularly irregular no murmurs rubs or gallops Lungs: CTAB no crackles, wheeze, rhonchi Abdomen: soft/nontender/nondistended/normal bowel sounds.  Ext: no edema Skin: warm, dry  Assessment/Plan:  Other issues 1. On ct abd/pelvis 06/10/17- "2. There is an area of slightly mass-like mural thickening of the proximal sigmoid colon. This region is decompressed, and this apparent mural thickening could simply be artifactual, however, the colonic lumen appears slightly dilated just proximal to this where there is a relatively large stool burden. The possibility of underlying colonic neoplasm should be considered, and further evaluation with nonemergent colonoscopy is suggested in the near future if clinically appropriate." Patient was difficult to reach but is finally scheduled with GI on 07/31/17- we reinforced importance of follow up 2. Due to difficulty reaching patient we asked for back up of being able to call a family member- he agrees to sign DPR for son  Night sweats S: night sweats much improved- sometimes sweats on chest but not enough to wake him up anymore and doesn't have to change clothes.   We removed multiple medications but medicine that seemed to help the most was coming off PPI.  A/P: glad this is much improved- patient agrees to follow up with GI given abnormal colon finding during workup for night sweats (once again even though night sweats largely resolved off PPI)  Atrial fibrillation, new onset ([place]) S: patient on eliqus for anticoagulation.   Sounds to be in a fib. HR low even off metoprolol.  A/P: continue eliquis alone  GERD S: no reflux still off both PPI and zantac- slight decrease in night sweats off this last visit and this visit substantial improvement reported A/P: continue without medication- will let us know if recurrent  Essential hypertension S: controlled on no rx.  metoprolol alone BP Readings from Last 3 Encounters:  07/28/17 132/84  06/12/17 138/88  06/02/17 130/80  A/P: at blood pressure goal of <140/90. Continue without meds and watch at least every 6 months  Diabetes mellitus type II, controlled ([place]) S: suspect controlled. On no rx Lab Results  Component Value Date   HGBA1C 6.3 04/30/2017   HGBA1C 6.7 (H) 09/16/2016   HGBA1C 6.1 06/10/2016   A/P: given PPI removal drastically lessened night sweats- doubt this was due to hypoglycemia. A1c up to 6.7 today- will monitor only without meds. Having some trouble with pricking his finger so may need to have him practice checking in office in future if have to put him back on medicine   Future Appointments  Date Time Provider [place]  07/31/2017  2:00 PM Willia Craze, NP [facility]  08/04/2017 11:00 AM [facility]  03/06/2018  1:00 PM Williemae Area, RN [facility]   Return in about 4 months (around 11/25/2017).  Return precautions advised.  [name], MD

## 2017-07-28 NOTE — Assessment & Plan Note (Signed)
S: suspect controlled. On no rx Lab Results  Component Value Date   HGBA1C 6.3 04/30/2017   HGBA1C 6.7 (H) 09/16/2016   HGBA1C 6.1 06/10/2016   A/P: given PPI removal drastically lessened night sweats- doubt this was due to hypoglycemia. A1c up to 6.7 today- will monitor only without meds. Having some trouble with pricking his finger so may need to have him practice checking in office in future if have to put him back on medicine

## 2017-07-28 NOTE — Assessment & Plan Note (Signed)
S: night sweats much improved- sometimes sweats on chest but not enough to wake him up anymore and doesn't have to change clothes.   We removed multiple medications but medicine that seemed to help the most was coming off PPI.  A/P: glad this is much improved- patient agrees to follow up with GI given abnormal colon finding during workup for night sweats (once again even though night sweats largely resolved off PPI)

## 2017-07-28 NOTE — Assessment & Plan Note (Signed)
S: no reflux still off both PPI and zantac- slight decrease in night sweats off this last visit and this visit substantial improvement reported A/P: continue without medication- will let us know if recurrent

## 2017-07-28 NOTE — Addendum Note (Signed)
Addended by: Lucianne Lei M on: 07/28/2017 01:48 PM   Modules accepted: Orders

## 2017-07-29 ENCOUNTER — Other Ambulatory Visit: Payer: Self-pay

## 2017-07-29 NOTE — Patient Outreach (Signed)
[place] [facility]) Care Management  07/29/2017  LAVERE STORK 08-16-1933 [id number]   Referral received for community care coordination on 07/25/17  Assessment: 84 year old with history of HTN,GERD, basal cell carcinoma, DM, CKD,major depression, new onset atrial fibrillarion.  RNCM called to schedule home visit. No answer. HIPPA compliant message left.  Thea Silversmith, RN, MSN, [place] Coordinator Cell: [phone]

## 2017-07-30 ENCOUNTER — Other Ambulatory Visit: Payer: Self-pay

## 2017-07-30 NOTE — Patient Outreach (Signed)
[facility] Burn[facility]) Care Management  07/30/2017  Calvin Burnett Burnett [birth year] [id number]   Referral received for community care coordination on 07/25/17  Assessment: 84 year old with history of HTN,GERD, basal cell carcinoma, DM, CKD,major depression, new onset atrial fibrillarion.  RNCM called to schedule home visit. No answer. HIPPA compliant message left.  Plan: follow up call by the end of the week, if no return call.  Thea Silversmith, RN, MSN, [place] Coordinator Cell: [phone]

## 2017-07-31 ENCOUNTER — Encounter: Payer: Self-pay | Admitting: Nurse Practitioner

## 2017-07-31 ENCOUNTER — Telehealth: Payer: Self-pay

## 2017-07-31 ENCOUNTER — Ambulatory Visit: Payer: PPO | Admitting: Nurse Practitioner

## 2017-07-31 VITALS — BP 134/68 | HR 86 | Ht 69.0 in | Wt 176.0 lb

## 2017-07-31 DIAGNOSIS — Z7901 Long term (current) use of anticoagulants: Secondary | ICD-10-CM | POA: Diagnosis not present

## 2017-07-31 DIAGNOSIS — Z86010 Personal history of colonic polyps: Secondary | ICD-10-CM

## 2017-07-31 DIAGNOSIS — R9389 Abnormal findings on diagnostic imaging of other specified body structures: Secondary | ICD-10-CM | POA: Diagnosis not present

## 2017-07-31 MED ORDER — NA SULFATE-K SULFATE-MG SULF 17.5-3.13-1.6 GM/177ML PO SOLN: ORAL | 0 refills | Status: DC

## 2017-07-31 NOTE — Patient Instructions (Addendum)
If you are age 82 or older, your body mass index should be between 23-30. Your Body mass index is 25.99 kg/m. If this is out of the aforementioned range listed, please consider follow up with your Primary Care Provider.  If you are age 21 or younger, your body mass index should be between 19-25. Your Body mass index is 25.99 kg/m. If this is out of the aformentioned range listed, please consider follow up with your Primary Care Provider.   You have been scheduled for a colonoscopy. Please follow written instructions given to you at your visit today.  Please pick up your prep supplies at the pharmacy within the next 1-3 days. If you use inhalers (even only as needed), please bring them with you on the day of your procedure. Your physician has requested that you go to www.startemmi.com and enter the access code given to you at your visit today. This web site gives a general overview about your procedure. However, you should still follow specific instructions given to you by our office regarding your preparation for the procedure.  We have sent the following medications to your pharmacy for you to pick up at your convenience: [place] will be contacted by our office prior to your procedure for directions on holding your Eliquis.  If you do not hear from our office 1 week prior to your scheduled procedure, please call [phone] to discuss.   Thank you for choosing me and [facility].   Tye Savoy, NP

## 2017-07-31 NOTE — Telephone Encounter (Signed)
[facility] [address] [place], [place]  [address] Phone:  [phone]   Fax:  [phone]  07/31/2017   RE:      Calvin Burnett DOB:   Apr 13, 1934 MRN:   [id number]   Dear [name],    We have scheduled the above patient for an endoscopic procedure. Our records show that he is on anticoagulation therapy.   Please advise as to how long the patient may come off his therapy of Eliquis prior to the colonoscopy procedure, which is scheduled for 09/03/17.  Please fax back/ or route the completed form to [place] at [phone].   Sincerely,    Thurmon Fair, RMA
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
## 2017-07-31 NOTE — Telephone Encounter (Signed)
Pt takes Eliquis for afib with CHADS2VASc score of 4 (age x2, HTN, DM). CrCl is 65mL/min. Recommend holding Eliquis for 24-48 hours prior to procedure.

## 2017-08-01 ENCOUNTER — Encounter: Payer: Self-pay | Admitting: Nurse Practitioner

## 2017-08-01 NOTE — Progress Notes (Signed)
Reviewed and agree with management plan.  Malcolm T. Stark, MD FACG 

## 2017-08-04 ENCOUNTER — Ambulatory Visit: Payer: Self-pay

## 2017-08-04 ENCOUNTER — Other Ambulatory Visit: Payer: Self-pay

## 2017-08-04 NOTE — Patient Outreach (Signed)
[name]) Care Management  08/04/2017  Calvin Burnett 24-Jul-1933 [id number]  Referral received for community care coordination on 07/25/17.  Assessment: 84 year old with history of HTN,GERD, basal cell carcinoma, DM, CKD,major depression, new onset atrial fibrillarion.  RNCM called to schedule home visit. Client without any issues at this time. States he was out getting his tires changed.  Plan: home visit scheduled for this week.  RNCM provided contact number and encouraged him to call as needed.  Thea Silversmith, RN, MSN, [place] Coordinator Cell: [phone]

## 2017-08-06 ENCOUNTER — Telehealth: Payer: Self-pay

## 2017-08-06 NOTE — Telephone Encounter (Signed)
Left message for patient to return my call to confirm receipt of message to Hold Eliquis 24-48 hours prior to procedure per Dr. Ronney Lion office.

## 2017-08-07 ENCOUNTER — Other Ambulatory Visit: Payer: Self-pay

## 2017-08-07 NOTE — Patient Outreach (Signed)
[facility]) Care Management   08/07/2017  Calvin Burnett Jan 26, 1934 [id number]  Calvin Burnett is an 84 y.o. male  Subjective: "It is my short term memory. I wonder if the doctor has something I can take for my short-term memory"?  Objective: BP 138/90   Pulse 66   Resp 20   Ht 1.753 m (5\' 9" ) Comment: per last notation in chart  Wt 176 lb (79.8 kg) Comment: per last note in chart.  SpO2 98%   BMI 25.99 kg/m    Review of Systems  Respiratory:       Lungs clear  Cardiovascular:       Slightly irregular.  Skin:       Warm dry, Left foot 3rd toe, client has a small scabbed area. No signs/symptoms of infection.    Physical Exam skin warm dry, color within normal limits.  Encounter Medications:   Outpatient Encounter Medications as of 08/07/2017  Medication Sig Note  . apixaban (ELIQUIS) 5 MG TABS tablet Take 1 tablet (5 mg total) by mouth 2 (two) times daily.   Marland Kitchen latanoprost (XALATAN) 0.005 % ophthalmic solution PLACE 1 DROP INTO BOTH EYES NIGHTLY. 08/23/2015: Received from: External Pharmacy  . Na Sulfate-K Sulfate-Mg Sulf 17.5-3.13-1.6 GM/177ML SOLN Suprep-Use as directed (Patient not taking: Reported on 08/07/2017)    No facility-administered encounter medications on file as of 08/07/2017.     Functional Status:   In your present state of health, do you have any difficulty performing the following activities: 08/07/2017 03/05/2017  Hearing? N N  Vision? N N  Difficulty concentrating or making decisions? Y N  Walking or climbing stairs? N N  Dressing or bathing? N N  Doing errands, shopping? N N  Preparing Food and eating ? N N  Using the Toilet? N N  In the past six months, have you accidently leaked urine? N N  Do you have problems with loss of bowel control? N N  Managing your Medications? N N  Managing your Finances? N N  Housekeeping or managing your Housekeeping? N N  Some recent data might be hidden    Fall/Depression Screening:    Fall  Risk  08/07/2017 03/05/2017 01/30/2017  Falls in the past year? No No No   PHQ 2/9 Scores 08/07/2017 07/24/2017 03/05/2017 01/30/2017 11/13/2015 10/04/2014 07/22/2012  PHQ - 2 Score 0 0 0 0 0 0 0    Referral received per [employer], NP-"please help member get set up with someone who can help him keep up with his appointments; needs referral to diabetic educator; Neurology for cognitive decline and tremors; could benefit from cardiology referral if he hasn't already; unable to physically maneuver glucometer to to hand tremors; gets confused misses his appointments; family may need to arrange to have someone keep up with his appointments and accompany him."  Assessment: 84 year old with history of HTN,GERD, basal cell carcinoma, DM, CKD,major depression, new onset atrial fibrillarion.   Client is widowed, lives alone. He reports he has three children. He states he remains active and prepares his own meals or goes out to eat. He states he loves to shop and find great finds at low prices. Client reports he has a brother in the area who he communicates with regularly.  Client acknowledges his has difficulty with short term memory and ask if there is some medications that would help his memory. He states he would be willing to discuss options with his primary care provider.  He reports his son lives and works in the [place] area. He states his son is very busy. However, he reports his son was at his home last night visiting him. He has a daughter in [place], who he talks to every day and a daughter in [place] whose communication is strained. His recommendation for who would be best to help him manage his appointments is [name], his daughter in [place]. He states he talks to [place] every day.  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 He reports it is much better now.  Medications  reviewed-client it taking two medications. Client states he is taking his Elliquis as scheduled and rarely misses a dose. He reports he has a reminder system that seems to be working for him. He lays two tablets out on the counter first thing every morning and he takes his morning dose and the second pill is left on the counter to take later in the day. The other medication is an eye drop.   History of diabetes. He is not on any diabetic medication. He reports it is managed by diet. He does not check his blood sugar because he states when he gets ready to stick himself his hand trembles and he can't get the blood on the strip. Currently client has a meter, but he does not know where the pen is. He reports he has had the meter for a while, but is not able to state when he received the meter. He is also running low on strips. Tremor not noted just in observing from a distance.  Plan: home visit in two weeks; follow up with client's daughter, update primary care. [facility]N CM Care Plan Problem One     Most Recent Value  Care Plan Problem One  care coordination needs surrounding self management of health needs.   Role Documenting the Problem One  Care Management [place] for Problem One  Active  [facility] Long Term Goal   client will verbalize family member or person that can help assist him with appointment adherence.  [facility]N Long Term Goal Start Date  08/07/17  Interventions for Problem One Long Term Goal  RNCM discussed importance of attending scheduled visits, RNCM reviewed list of family members that could help with appointment reminders, RNCM encouraged client to reach out to his daughter regarding appointment reminders, RNCM proivded a [facility] calender/organizer and encouraged client to use.  [facility]N CM Short Term Goal #1   client will discuss appointment adherence with daughter and ask if she will assist him in managing appointments within the next 2-3 weeks.  [facility]N CM Short Term Goal #1 Start Date  08/07/17   Interventions for Short Term Goal #1  RNCM discussed which person he thought would be best to help him manage his appointments.   THN CM Short Term Goal #2   client will obtain diabetic supplies within the next 2-3 weeks.  [facility]N CM Short Term Goal #2 Start Date  08/07/17  Interventions for Short Term Goal #2  reviewed diabetic supplies with client, encouraged client to follow up with primary care provider, RNCM will follow up with duaghter to inform her of diabetic supply needs.       Thea Silversmith, RN, MSN, [place] Coordinator Cell: [phone]

## 2017-08-08 ENCOUNTER — Other Ambulatory Visit: Payer: Self-pay

## 2017-08-08 NOTE — Patient Outreach (Signed)
[place] [facility]) Care Management  08/08/2017  NAIF ALABI 10/20/1933 [id number]   Care coordination: RNCM called to speak with daughter, Lillia Carmel. No answer. HIPPA compliant message left.  Plan: await return call.  Thea Silversmith, RN, MSN, [place] Coordinator Cell: [phone]

## 2017-08-08 NOTE — Patient Outreach (Signed)
[place] [facility]) Care Management  08/08/2017  Calvin Burnett 1933-10-15 [id number]   Care Coordination: RNCM received return call from client's daughter, Lillia Carmel. She states "I would like to help him keep up with that". She is very supportive of her father and states she has been thinking of moving back into the area to help both her mother and father.   RNCM informed her of client's up coming appointments. She was provided cardiologist office number and she states she will call to reschedule client's appointment. She states she will also take client to his colonoscopy appointment.  RNCM also encouraged her to encourage client to give consent for providers to discuss his health information with her so that it will be easier for her to keep up with his appointments.   RNCM confirmed contact number and encouraged Ms. Graves to call RNCM as needed.   Thea Silversmith, RN, MSN, [place] Coordinator Cell: [phone]

## 2017-08-11 DIAGNOSIS — H401132 Primary open-angle glaucoma, bilateral, moderate stage: Secondary | ICD-10-CM | POA: Diagnosis not present

## 2017-08-12 ENCOUNTER — Other Ambulatory Visit: Payer: Self-pay

## 2017-08-12 NOTE — Patient Outreach (Signed)
[place] [facility]) Care Management  08/12/2017  Calvin Burnett 10-30-1933 [id number]   Care Coordination: RNCM called primary care office to inform that RNCM's note from home visit was sent last week. Also left message regarding client's question - if there was some medication to help with his short term memory loss. Also informed that client's daughter, Lillia Carmel, stated she would be willing to help client manage his appointments. Contact number provided. RNCM spoke with Lumin(office staff) who reports she will make a note regarding the need for client to sign a new designated party release form.  Plan: continue to follow.  Thea Silversmith, RN, MSN, [place] Coordinator Cell: [phone]

## 2017-08-12 NOTE — Telephone Encounter (Signed)
Spoke with patient this morning regarding holding his Eliquis 24-48 hours prior to his colonoscopy procedure.  Patient verbalized understanding.

## 2017-08-14 ENCOUNTER — Ambulatory Visit (INDEPENDENT_AMBULATORY_CARE_PROVIDER_SITE_OTHER): Payer: PPO

## 2017-08-14 DIAGNOSIS — R001 Bradycardia, unspecified: Secondary | ICD-10-CM | POA: Diagnosis not present

## 2017-08-19 ENCOUNTER — Telehealth: Payer: Self-pay | Admitting: Family Medicine

## 2017-08-19 ENCOUNTER — Other Ambulatory Visit: Payer: Self-pay

## 2017-08-19 NOTE — Telephone Encounter (Signed)
Copied from [place] [phone]. Topic: Quick Communication - See Telephone Encounter >> Aug 19, 2017 11:19 AM [place], Laban Emperor wrote: CRM for notification. See Telephone encounter for:  Denton Brick would like to know if the provider had any meter sample for the pt. Also has some questions about his memory lose.  Contact [phone]  08/19/17.

## 2017-08-19 NOTE — Telephone Encounter (Signed)
Please advise 

## 2017-08-19 NOTE — Patient Outreach (Signed)
[place] [facility]) Care Management   08/19/2017  WEBSTER PATRONE [birth year] [id number]  PRINSTON KYNARD is an 84 y.o. male  Subjective: "I've been feeling good. I just forget things."  Objective: Pulse 83   Ht 1.778 m (5\' 10" )   Wt 170 lb (77.1 kg) Comment: patient estimated.  SpO2 97%   BMI 24.39 kg/m   Review of Systems  Respiratory:       Lungs clear.  Cardiovascular:       Slightly irregular.    Physical Exam skin warm dry, color within normal limits.  Encounter Medications:   Outpatient Encounter Medications as of 08/19/2017  Medication Sig Note  . apixaban (ELIQUIS) 5 MG TABS tablet Take 1 tablet (5 mg total) by mouth 2 (two) times daily.   Marland Kitchen latanoprost (XALATAN) 0.005 % ophthalmic solution PLACE 1 DROP INTO BOTH EYES NIGHTLY. 08/23/2015: Received from: External Pharmacy  . Na Sulfate-K Sulfate-Mg Sulf 17.5-3.13-1.6 GM/177ML SOLN Suprep-Use as directed (Patient not taking: Reported on 08/07/2017)    No facility-administered encounter medications on file as of 08/19/2017.     Functional Status:   In your present state of health, do you have any difficulty performing the following activities: 08/07/2017 03/05/2017  Hearing? N N  Vision? N N  Difficulty concentrating or making decisions? Y N  Walking or climbing stairs? N N  Dressing or bathing? N N  Doing errands, shopping? N N  Preparing Food and eating ? N N  Using the Toilet? N N  In the past six months, have you accidently leaked urine? N N  Do you have problems with loss of bowel control? N N  Managing your Medications? N N  Managing your Finances? N N  Housekeeping or managing your Housekeeping? N N  Some recent data might be hidden    Fall/Depression Screening:    Fall Risk  08/07/2017 03/05/2017 01/30/2017  Falls in the past year? No No No   PHQ 2/9 Scores 08/07/2017 07/24/2017 03/05/2017 01/30/2017 11/13/2015 10/04/2014 07/22/2012  PHQ - 2 Score 0 0 0 0 0 0 0   Assessment:  84 year old with history of  diabetes(diet controlled), hypertension (lifestyle managed).   Follow up home visit. Client was not home when [facility] arrived, but client came prior to RNCM's departure. Mr. Tremblay states he forgot about his appointment today. He reports his primary concern is his memory. He has not followed up on obtaining glucose supplies to be able to check blood sugars as needed.   Client reports his daughter is now helping him to keep up with his appointments.  Client has a one touch verio meter, but only has one strip in the bottle that expired in 2017. He does not know how long he has had this meter. Client states he would like to get a new glucose meter, but is not sure of the cost and would like to know if his primary care office has any meters they can provide to him. It is unclear at present if primary care recommends he check his blood sugar. RNCM will call primary care office regarding a glucose meter.  Blood pressure 138/90. RNCM informed client of his target goal per primary care. Blood pressure reading written in calender/organizer. Discussed options for checking blood pressure preference go to [facility] and check his blood pressure.  RNCM called primary care and left message re: client's primary concern "memory" and glucometer. if needed.  Plan: await response, continue to follow.   Thea Silversmith,  RN, MSN, [place] Coordinator Cell: [phone]

## 2017-08-20 ENCOUNTER — Encounter: Payer: Self-pay | Admitting: Gastroenterology

## 2017-08-21 NOTE — Telephone Encounter (Signed)
Left message on Calvin Burnett's voicemail to call office.

## 2017-08-22 ENCOUNTER — Other Ambulatory Visit: Payer: Self-pay

## 2017-08-22 NOTE — Patient Outreach (Signed)
[place] [facility]) Care Management  08/22/2017  Calvin Burnett 1933-12-16 [id number]   Care Coordinator: [facility] returned call to [facility] with Calvin Burnett office. She reports they are aware of clients memory issues and that Calvin Burnett has discussed with this with Calvin Burnett in the past. She also states that they are aware and have discussed client's shake when trying to check blood sugars. Currently, it is not an expectation of client to check blood sugars, but Calvin Burnett will see if the office has a glucometer they can give to client and will follow up with client. Calvin Burnett is not on any diabetic medications at this time. Calvin Burnett also confirmed that client's daughter is on their release form.   Plan: continue to follow.  Calvin Silversmith, RN, MSN, [place] Coordinator Cell: [phone]

## 2017-08-22 NOTE — Telephone Encounter (Signed)
Denton Brick called back this morning. I spoke with her. She wanted to confirm daughter was on DPR as patient is complaining of memory issues. I stated [name] and patient have discussed memory issues. She wants patient to have a CBG meter. I told her we could provide one but patient has stated in the past he isn't able to do this. He did tell her that he shakes too much to stick himself. She said if we provided one to let her know and she would follow up to teach patient how.

## 2017-08-28 ENCOUNTER — Other Ambulatory Visit: Payer: Self-pay

## 2017-08-28 NOTE — Patient Outreach (Signed)
[place] [facility]) Care Management  08/28/2017  Calvin Burnett 1934-04-05 [id number]   RNCM received call from client. Calvin Burnett requested a return call back on his mobile phone. RNCM returned call. No answer. HIPPA compliant message left.  Plan: continue to follow.  Thea Silversmith, RN, MSN, [place] Coordinator Cell: [phone]

## 2017-08-28 NOTE — Patient Outreach (Signed)
[place] [facility]) Care Management  08/28/2017  ESCO JOSLYN [birth year] [id number]   Assessment: 84 year old with history of diabetes(diet controlled), hypertension (lifestyle managed). RNCM called to follow up. No answer. HIPPA compliant message left.  Plan: follow up call next business day.  Thea Silversmith, RN, MSN, [place] Coordinator Cell: [phone]

## 2017-08-29 ENCOUNTER — Other Ambulatory Visit: Payer: Self-pay

## 2017-08-29 ENCOUNTER — Ambulatory Visit: Payer: PPO | Admitting: Physician Assistant

## 2017-08-29 ENCOUNTER — Encounter: Payer: Self-pay | Admitting: Physician Assistant

## 2017-08-29 VITALS — BP 142/90 | HR 70 | Ht 70.0 in | Wt 175.8 lb

## 2017-08-29 DIAGNOSIS — E119 Type 2 diabetes mellitus without complications: Secondary | ICD-10-CM | POA: Diagnosis not present

## 2017-08-29 DIAGNOSIS — R001 Bradycardia, unspecified: Secondary | ICD-10-CM

## 2017-08-29 DIAGNOSIS — I482 Chronic atrial fibrillation, unspecified: Secondary | ICD-10-CM

## 2017-08-29 DIAGNOSIS — E785 Hyperlipidemia, unspecified: Secondary | ICD-10-CM | POA: Diagnosis not present

## 2017-08-29 DIAGNOSIS — I10 Essential (primary) hypertension: Secondary | ICD-10-CM | POA: Diagnosis not present

## 2017-08-29 NOTE — Patient Instructions (Signed)
Medication Instructions: Continue current medications  If you need a refill on your cardiac medications before your next appointment, please call your pharmacy.  Labwork: None Ordered   Testing/Procedures: None Ordered  Follow-Up: Your physician wants you to follow-up in: 6 Months with Dr Stanford Breed. You should receive a reminder letter in the mail two months in advance. If you do not receive a letter, please call our office [phone].   Thank you for choosing [facility]!!

## 2017-08-29 NOTE — Patient Outreach (Signed)
[place] [facility]) Care Management  08/29/2017  Calvin Burnett 03-31-1934 [id number]    Subjective: client reports he is doing well. Denies any concerns at this time.  Objective: none  Assessment: 84 year old with history of diabetes(diet controlled), hypertension.  RNCM called to follow up. Client without any questions or problems. He reports he has an appointment with cardiology on today. RNCM confirmed this appointment through chart. Client also states that he has a colonoscopy scheduled on the 20th of this month and states his daughter is going with him.   RNCM called primary care office today to follow up with York Cerise at primary care office. She confirms they are aware that client is not checking his blood sugar. She stated they would follow up with client about this at his next appointment with family member present. RNCM informed client of this and reinforced importance of his daughter being present at visit.   Goals met-RNCM discussed transition to health coach. Client declines health coaching for disease management.  RNCM encouraged client to call with questions or if needs change.  Plan: close case.  Thea Silversmith, RN, MSN, [place] Coordinator Cell: [phone]
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
## 2017-08-29 NOTE — Telephone Encounter (Signed)
Spoke with Calvin Burnett again this morning and decided that we will discuss meter and checking blood sugar at next visit

## 2017-08-30 ENCOUNTER — Encounter: Payer: Self-pay | Admitting: Physician Assistant

## 2017-09-03 ENCOUNTER — Ambulatory Visit (AMBULATORY_SURGERY_CENTER): Payer: PPO | Admitting: Gastroenterology

## 2017-09-03 ENCOUNTER — Other Ambulatory Visit: Payer: Self-pay

## 2017-09-03 ENCOUNTER — Encounter: Payer: Self-pay | Admitting: Gastroenterology

## 2017-09-03 VITALS — BP 113/81 | HR 68 | Temp 97.1°F | Resp 14 | Ht 69.0 in | Wt 176.0 lb

## 2017-09-03 DIAGNOSIS — D123 Benign neoplasm of transverse colon: Secondary | ICD-10-CM | POA: Diagnosis not present

## 2017-09-03 DIAGNOSIS — Z86010 Personal history of colonic polyps: Secondary | ICD-10-CM | POA: Diagnosis not present

## 2017-09-03 DIAGNOSIS — R9389 Abnormal findings on diagnostic imaging of other specified body structures: Secondary | ICD-10-CM | POA: Diagnosis not present

## 2017-09-03 DIAGNOSIS — I4891 Unspecified atrial fibrillation: Secondary | ICD-10-CM | POA: Diagnosis not present

## 2017-09-03 MED ORDER — SODIUM CHLORIDE 0.9 % IV SOLN: 500.0000 mL | Freq: Once | INTRAVENOUS | Status: DC

## 2017-09-03 NOTE — Progress Notes (Signed)
Pt's states no medical or surgical changes since previsit or office visit. 

## 2017-09-03 NOTE — Progress Notes (Signed)
Report given to PACU, vss 

## 2017-09-03 NOTE — Op Note (Signed)
[place] Patient Name: Calvin Burnett Procedure Date: 09/03/2017 1:22 PM MRN: [id number] Endoscopist: Ladene Artist , MD Age: 84 Referring MD:  Date of Birth: [birth year] Gender: Male Account #: [id number] Procedure:                Colonoscopy Indications:              Abnormal CT of the GI tract Medicines:                Monitored Anesthesia Care Procedure:                Pre-Anesthesia Assessment:                           - Prior to the procedure, a History and Physical                            was performed, and patient medications and                            allergies were reviewed. The patient's tolerance of                            previous anesthesia was also reviewed. The risks                            and benefits of the procedure and the sedation                            options and risks were discussed with the patient.                            All questions were answered, and informed consent                            was obtained. Prior Anticoagulants: The patient has                            taken Eliquis (apixaban), last dose was 2 days                            prior to procedure. ASA Grade Assessment: III - A                            patient with severe systemic disease. After                            reviewing the risks and benefits, the patient was                            deemed in satisfactory condition to undergo the                            procedure.  After obtaining informed consent, the colonoscope                            was passed under direct vision. Throughout the                            procedure, the patient's blood pressure, pulse, and                            oxygen saturations were monitored continuously. The                            Colonoscope was introduced through the anus and                            advanced to the the cecum, identified by   appendiceal orifice and ileocecal valve. The                            ileocecal valve, appendiceal orifice, and rectum                            were photographed. The quality of the bowel                            preparation was fair even after extensive lavage                            and suctioning. The colonoscopy was performed                            without difficulty. The patient tolerated the                            procedure well. Scope In: 1:48:05 PM Scope Out: 2:13:37 PM Scope Withdrawal Time: 0 hours 22 minutes 59 seconds  Total Procedure Duration: 0 hours 25 minutes 32 seconds  Findings:                 The perianal and digital rectal examinations were                            normal.                           A 40 mm polyp was found in the distal transverse                            colon. The polyp was sessile and irregularly shaped                            extending across 2 folds. One area the middle of                            the polyp was firmer. The polyp was  partially                            removed with a hot snare. Polyp resection was                            incomplete. The resected tissue was retrieved. Two                            areas were successfully injected with at total 5 mL                            Niger ink for tattooing adjacnent to and distal to                            the polyp.                           A 8 mm polyp was found in the proximal transverse                            colon. The polyp was sessile. The polyp was removed                            with a cold snare. Resection and retrieval were                            complete.                           Many small-mouthed diverticula were found in the                            left colon. There was narrowing of the colon in                            association with the diverticular opening. There                            was evidence of diverticular  spasm. There was                            evidence of an impacted diverticulum. There was no                            evidence of diverticular bleeding.                           Internal hemorrhoids were found during                            retroflexion. The hemorrhoids were small and Grade  I (internal hemorrhoids that do not prolapse).                           The exam was otherwise without abnormality on                            direct and retroflexion views. Complications:            No immediate complications. Estimated blood loss:                            None. Estimated Blood Loss:     Estimated blood loss: none. Impression:               - Preparation of the colon was fair.                           - One 40 mm polyp in the distal transverse colon,                            partially removed with a hot snare. Incomplete                            resection. Resected tissue retrieved. Injections                            for tattoos.                           - One 8 mm polyp in the proximal transverse colon,                            removed with a cold snare. Resected and retrieved.                           - Moderate diverticulosis in the left colon.                           - Internal hemorrhoids.                           - The examination was otherwise normal on direct                            and retroflexion views. Recommendation:           - Repeat colonoscopy in 1 year for surveillance                            with a more extensive bowel prep.                           - Resume Eliquis (apixaban) tomorrow at prior dose.                            Refer to managing physician for further adjustment  of therapy.                           - Patient has a contact number available for                            emergencies. The signs and symptoms of potential                            delayed  complications were discussed with the                            patient. Return to normal activities tomorrow.                            Written discharge instructions were provided to the                            patient.                           - Resume previous diet.                           - Continue present medications.                           - Await pathology results.                           - No aspirin, ibuprofen, naproxen, or other                            non-steroidal anti-inflammatory drugs for 2 weeks                            after polyp removal.                           - Surgical referral for resection of partially                            removed polyp. Ladene Artist, MD 09/03/2017 2:24:05 PM This report has been signed electronically.

## 2017-09-03 NOTE — Patient Instructions (Addendum)
YOU HAD AN ENDOSCOPIC PROCEDURE TODAY AT [facility]:   Refer to the procedure report that was given to you for any specific questions about what was found during the examination.  If the procedure report does not answer your questions, please call your gastroenterologist to clarify.  If you requested that your care partner not be given the details of your procedure findings, then the procedure report has been included in a sealed envelope for you to review at your convenience later.  YOU SHOULD EXPECT: Some feelings of bloating in the abdomen. Passage of more gas than usual.  Walking can help get rid of the air that was put into your GI tract during the procedure and reduce the bloating. If you had a lower endoscopy (such as a colonoscopy or flexible sigmoidoscopy) you may notice spotting of blood in your stool or on the toilet paper. If you underwent a bowel prep for your procedure, you may not have a normal bowel movement for a few days.  Please Note:  You might notice some irritation and congestion in your nose or some drainage.  This is from the oxygen used during your procedure.  There is no need for concern and it should clear up in a day or so.  SYMPTOMS TO REPORT IMMEDIATELY:   Following lower endoscopy (colonoscopy or flexible sigmoidoscopy):  Excessive amounts of blood in the stool  Significant tenderness or worsening of abdominal pains  Swelling of the abdomen that is new, acute  Fever of 100F or higher  For urgent or emergent issues, a gastroenterologist can be reached at any hour by calling [phone].   DIET:  We do recommend a small meal at first, but then you may proceed to your regular diet.  Drink plenty of fluids but you should avoid alcoholic beverages for 24 hours.  MEDICATIONS: Continue present medications. Resume Eliquis (apixaban) tomorrow at prior dose. No Aspirin, Naproxen, or other non-steroidal anti-inflammatory drugs for 2 weeks after polyp  removal.  Please see handouts given to you by your recovery nurse.  FOLLOW-UP: Surgical referral for resection of partially removed polyp. Dr. Lynne Leader office will call you to set up an appointment.   ACTIVITY:  You should plan to take it easy for the rest of today and you should NOT DRIVE or use heavy machinery until tomorrow (because of the sedation medicines used during the test).    FOLLOW UP: Our staff will call the number listed on your records the next business day following your procedure to check on you and address any questions or concerns that you may have regarding the information given to you following your procedure. If we do not reach you, we will leave a message.  However, if you are feeling well and you are not experiencing any problems, there is no need to return our call.  We will assume that you have returned to your regular daily activities without incident.  If any biopsies were taken you will be contacted by phone or by letter within the next 1-3 weeks.  Please call us at [phone] if you have not heard about the biopsies in 3 weeks.   Thank you for allowing [place] to provide for your healthcare needs today.   SIGNATURES/CONFIDENTIALITY: You and/or your care partner have signed paperwork which will be entered into your electronic medical record.  These signatures attest to the fact that that the information above on your After Visit Summary has been reviewed and is understood.  Full responsibility of the confidentiality of this discharge information lies with you and/or your care-partner.

## 2017-09-03 NOTE — Progress Notes (Signed)
Called to room to assist during endoscopic procedure.  Patient ID and intended procedure confirmed with present staff. Received instructions for my participation in the procedure from the performing physician.  

## 2017-09-04 ENCOUNTER — Telehealth: Payer: Self-pay

## 2017-09-04 ENCOUNTER — Telehealth: Payer: Self-pay | Admitting: *Deleted

## 2017-09-04 NOTE — Telephone Encounter (Signed)
No answer, message left for the patient. 

## 2017-09-04 NOTE — Telephone Encounter (Signed)
Left message

## 2017-09-04 NOTE — Telephone Encounter (Signed)
Patient referral faxed to [facility] for surgical removal of colon polyp.  I left a detailed message for the patient that they will call him with an appt one day next week.

## 2017-09-09 NOTE — Telephone Encounter (Signed)
Patient has been scheduled for a consult with Dr. Donne Hazel for 09/22/17 3:00

## 2017-09-13 ENCOUNTER — Encounter: Payer: Self-pay | Admitting: Gastroenterology

## 2017-09-30 ENCOUNTER — Telehealth: Payer: Self-pay

## 2017-09-30 ENCOUNTER — Ambulatory Visit: Payer: Self-pay | Admitting: Surgery

## 2017-09-30 DIAGNOSIS — D125 Benign neoplasm of sigmoid colon: Secondary | ICD-10-CM | POA: Diagnosis not present

## 2017-09-30 DIAGNOSIS — Z01818 Encounter for other preprocedural examination: Secondary | ICD-10-CM | POA: Diagnosis not present

## 2017-09-30 DIAGNOSIS — K4090 Unilateral inguinal hernia, without obstruction or gangrene, not specified as recurrent: Secondary | ICD-10-CM | POA: Diagnosis not present

## 2017-09-30 NOTE — Telephone Encounter (Signed)
   [facility] Pre-operative Risk Assessment    Request for surgical clearance:  1. What type of surgery is being performed? Colon surgery  2. When is this surgery scheduled? pending  3. What type of clearance is required (medical clearance vs. Pharmacy clearance to hold med vs. Both)? Medical and pharmacy  4. Are there any medications that need to be held prior to surgery and how long?eliquis  5. Practice name and name of physician performing surgery? [facility]   Dr.Gross  6. What is your office phone and fax number? Phone # [phone]  Fax # [phone].  7. Anesthesia type (None, local, MAC, general) ? general   Kathyrn Lass 09/30/2017, 2:06 PM  _________________________________________________________________   (provider comments below)

## 2017-10-02 ENCOUNTER — Ambulatory Visit: Payer: Self-pay | Admitting: Surgery

## 2017-10-02 DIAGNOSIS — C183 Malignant neoplasm of hepatic flexure: Secondary | ICD-10-CM | POA: Insufficient documentation

## 2017-10-02 DIAGNOSIS — K4090 Unilateral inguinal hernia, without obstruction or gangrene, not specified as recurrent: Secondary | ICD-10-CM

## 2017-10-02 DIAGNOSIS — D125 Benign neoplasm of sigmoid colon: Secondary | ICD-10-CM

## 2017-10-02 DIAGNOSIS — Z85038 Personal history of other malignant neoplasm of large intestine: Secondary | ICD-10-CM | POA: Insufficient documentation

## 2017-10-02 NOTE — Telephone Encounter (Signed)
   Primary Cardiologist: No primary care provider on file.  Chart reviewed as part of pre-operative protocol coverage. Patient was contacted 10/02/2017 in reference to pre-operative risk assessment for pending surgery as outlined below.  Calvin Burnett was last seen on 08/29/17 by Almyra Deforest, PA.  Since that day, Calvin Burnett has done well without any dizziness, feelings of afib or exertional symptoms with his daily walks.  Therefore, based on ACC/AHA guidelines, the patient would be at acceptable risk for the planned procedure without further cardiovascular testing.     Please see attached recommendations on anticoagulation.  I will route this recommendation to the requesting party via Epic fax function and remove from pre-op pool.  Please call with questions.  Daune Perch, NP 10/02/2017, 4:57 PM

## 2017-10-02 NOTE — Telephone Encounter (Addendum)
Pt takes Eliquis for afib with CHADS2VASc score of 4 (age x2, HTN, DM). CrCl is 20mL/min. Ok to hold Eliquis for 2 days prior to colon polyp removal.

## 2017-10-02 NOTE — H&P (Signed)
[name] Documented: 09/30/2017 11:25 AM Location: [facility] Patient #: [id number] DOB: 04/08/1934 Widowed / Language: Cleophus Molt / Race: White Male  History of Present Illness [name] MD; 10/02/2017 4:56 PM) The patient is a 84 year old male who presents with a colonic polyp. Note for "Colonic polyp": ` ` ` Patient sent for surgical consultation at the request of Dr. Lucio Edward  Chief Complaint: Abnormal polyp and left-sided colon by CT scan and colonoscopy. Not resectable by endoscopy. Request for surgical resection.  The patient is an elderly male with history of prior colonoscopies and colon polyps. Had some night sweats and generalized discomfort. Had some thickening in his proximal sigmoid colon by CT scan last November. Was referred back to gastroenterology. Had colonoscopy last month that confirmed a 4 cm mass in the left colon. Biopsies consistent with adenoma. Not able to be resected by endoscopy. Surgical consultation requested. Seems to correlate with the abnormal area in the proximal sigmoid colon by CT scan. Apparently patient has not been particular symptomatic with any bleeding or crampiness or discomfort. He is on Eliquis for atrial fibrillation.  No personal nor family history of GI/colon cancer, inflammatory bowel disease, irritable bowel syndrome, allergy such as Celiac Sprue, dietary/dairy problems, colitis, ulcers nor gastritis. No recent sick contacts/gastroenteritis. No travel outside the country. No changes in diet. No dysphagia to solids or liquids. No significant heartburn or reflux. No hematochezia, hematemesis, coffee ground emesis. No evidence of prior gastric/peptic ulceration. He had an open cholecystectomy in the distant past. Appendectomy. Usually moves his bowels every day. Not any fiber. He can walk 1/2-hour without difficulty. He comes today with his sister-in-law to help him. He is to be a diabetic controlled  with diet and no medicines. Really just on Eliquis and nothing else at this point. He is never smoked.  (Review of systems as stated in this history (HPI) or in the review of systems. Otherwise all other 12 point ROS are negative) ` ` `   Allergies (Danielle Gerrigner, CMA; 09/30/2017 11:25 AM) No Known Drug Allergies [09/30/2017]: Allergies Reconciled  Medication History Levonne Spiller, CMA; 09/30/2017 11:26 AM) Eliquis (5MG  Tablet, Oral) Active. Medications Reconciled    Vitals (Danielle Gerrigner CMA; 09/30/2017 11:27 AM) 09/30/2017 11:26 AM Weight: 177 lb Height: 70in Body Surface Area: 1.98 m Body Mass Index: 25.4 kg/m  Temp.: 48F(Oral)  Pulse: 58 (Regular)  BP: 130/80 (Sitting, Left Arm, Standard)      Physical Exam [name] MD; 09/30/2017 11:53 AM)  General Mental Status-Alert. General Appearance-Not in acute distress, Not Sickly. Orientation-Oriented X3. Hydration-Well hydrated. Voice-Normal.  Integumentary Global Assessment Upon inspection and palpation of skin surfaces of the - Axillae: non-tender, no inflammation or ulceration, no drainage. and Distribution of scalp and body hair is normal. General Characteristics Temperature - normal warmth is noted.  Head and Neck Head-normocephalic, atraumatic with no lesions or palpable masses. Face Global Assessment - atraumatic, no absence of expression. Neck Global Assessment - no abnormal movements, no bruit auscultated on the right, no bruit auscultated on the left, no decreased range of motion, non-tender. Trachea-midline. Thyroid Gland Characteristics - non-tender.  Eye Eyeball - Left-Extraocular movements intact, No Nystagmus. Eyeball - Right-Extraocular movements intact, No Nystagmus. Cornea - Left-No Hazy. Cornea - Right-No Hazy. Sclera/Conjunctiva - Left-No scleral icterus, No Discharge. Sclera/Conjunctiva - Right-No scleral icterus, No  Discharge. Pupil - Left-Direct reaction to light normal. Pupil - Right-Direct reaction to light normal. Note: Wears glasses. Vision corrected  ENMT Ears  Pinna - Left - no drainage observed, no generalized tenderness observed. Right - no drainage observed, no generalized tenderness observed. Nose and Sinuses External Inspection of the Nose - no destructive lesion observed. Inspection of the nares - Left - quiet respiration. Right - quiet respiration. Mouth and Throat Lips - Upper Lip - no fissures observed, no pallor noted. Lower Lip - no fissures observed, no pallor noted. Nasopharynx - no discharge present. Oral Cavity/Oropharynx - Tongue - no dryness observed. Oral Mucosa - no cyanosis observed. Hypopharynx - no evidence of airway distress observed.  Chest and Lung Exam Inspection Movements - Normal and Symmetrical. Accessory muscles - No use of accessory muscles in breathing. Palpation Palpation of the chest reveals - Non-tender. Auscultation Breath sounds - Normal and Clear.  Cardiovascular Auscultation Rhythm - Regular. Murmurs & Other Heart Sounds - Auscultation of the heart reveals - No Murmurs and No Systolic Clicks.  Abdomen Inspection Inspection of the abdomen reveals - No Visible peristalsis and No Abnormal pulsations. Umbilicus - No Bleeding, No Urine drainage. Palpation/Percussion Palpation and Percussion of the abdomen reveal - Soft, Non Tender, No Rebound tenderness, No Rigidity (guarding) and No Cutaneous hyperesthesia. Note: Abdomen soft. Moderate diastases recti. Right subcostal incision consistent with prior open cholecystectomy. Right lower quadrant vertical incision consistent with prior appendectomy. Nontender. Not distended. No umbilical or incisional hernias. No guarding.  Male Genitourinary Sexual Maturity Tanner 5 - Adult hair pattern and Adult penile size and shape. Note: Obvious right inguinal hernia on Valsalva only. Not sensitive. Easily  reducible. No hernia on left side. Otherwise normal external genitalia  Rectal Note: Deferred given recent colonoscopy  Peripheral Vascular Upper Extremity Inspection - Left - No Cyanotic nailbeds, Not Ischemic. Right - No Cyanotic nailbeds, Not Ischemic.  Neurologic Neurologic evaluation reveals -normal attention span and ability to concentrate, able to name objects and repeat phrases. Appropriate fund of knowledge , normal sensation and normal coordination. Mental Status Affect - not angry, not paranoid. Cranial Nerves-Normal Bilaterally. Gait-Normal. Note: Mild weakness on shrug on right shoulder.  Neuropsychiatric Mental status exam performed with findings of-able to articulate well with normal speech/language, rate, volume and coherence, thought content normal with ability to perform basic computations and apply abstract reasoning and no evidence of hallucinations, delusions, obsessions or homicidal/suicidal ideation. Note: Mild fair long-term memory.  Musculoskeletal Global Assessment Spine, Ribs and Pelvis - no instability, subluxation or laxity. Right Upper Extremity - no instability, subluxation or laxity.  Lymphatic Head & Neck  General Head & Neck Lymphatics: Bilateral - Description - No Localized lymphadenopathy. Axillary  General Axillary Region: Bilateral - Description - No Localized lymphadenopathy. Femoral & Inguinal  Generalized Femoral & Inguinal Lymphatics: Left - Description - No Localized lymphadenopathy. Right - Description - No Localized lymphadenopathy.    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 Biopsy consistent with adenoma. Suspicion endoscopically for something more advance like a cancer.  I think he would benefit from segmental colonic resection. Reasonable minimally invasive  robotic approach. Perhaps could remove some of the area of diverticular narrowing and thickening that seems like it mainly in that region anyway by CT scan  I would like cardiac clearance in the fact of CAD w atrial fibrillation with Eliquis. Make sure it safe to come off that. Sounds like he saw Dr. Stanford Breed in the past, although his primary care physician I believe started  him on his Eliquis.  Current Plans I recommended obtaining preoperative cardiac clearance for recommendations on management of anticoagualtion perioperatively:  1. Timing of holding anticoagulation 2. Need for any bridge therapy (SQ enoxaparin, IV heparin, IV Aggrastat, etc) preop/postop. 3. Desired timing of resumption of anticoagulation.  In general from Dr. Johney [place]' standpoint:  Aspirin is okay to continue perioperatively (81mg  or 325mg ) & does not need to be held  Hold warfarin 5 dayspreoperatively. Consider PT/INR level on arrival to short stay the day of surgery. No need to check PT/INR level on preop visit  Hold P2Y12 inibitors such as clopidrogel (Plavix) 4 dayspreoperatively  Hold direct thrombin / factor Xa inhibitors (Xerelto, Pradaxa, Eliquis, etc) 2 dayspreoperatively   Request clearance by cardiology to better assess operative risk & see if a reevaluation, further workup, etc is needed. Also recommendations on how medications such as for anticoagulation and blood pressure should be managed/held/restarted after surgery.   PREOP COLON - ENCOUNTER FOR PREOPERATIVE EXAMINATION FOR GENERAL SURGICAL PROCEDURE (Z01.818)  Current Plans You are being scheduled for surgery- Our schedulers will call you.  You should hear from our office's scheduling department within 5 working days about the location, date, and time of surgery. We try to make accommodations for patient's preferences in scheduling surgery, but sometimes the OR schedule or the surgeon's schedule prevents [place] from making those  accommodations.  If you have not heard from our office [phone]) in 5 working days, call the office and ask for your surgeon's nurse.  If you have other questions about your diagnosis, plan, or surgery, call the office and ask for your surgeon's nurse.  Written instructions provided The anatomy & physiology of the digestive tract was discussed. The pathophysiology of the colon was discussed. Natural history risks without surgery was discussed. I feel the risks of no intervention will lead to serious problems that outweigh the operative risks; therefore, I recommended a partial colectomy to remove the pathology. Minimally invasive (Robotic/Laparoscopic) & open techniques were discussed.  Risks such as bleeding, infection, abscess, leak, reoperation, possible ostomy, hernia, heart attack, death, and other risks were discussed. I noted a good likelihood this will help address the problem. Goals of post-operative recovery were discussed as well. Need for adequate nutrition, daily bowel regimen and healthy physical activity, to optimize recovery was noted as well. We will work to minimize complications. Educational materials were available as well. Questions were answered. The patient expresses understanding & wishes to proceed with surgery.  Pt Education - [facility] Colon Bowel Prep 2018 ERAS/Miralax/Antibiotics Started Neomycin Sulfate 500 MG Oral Tablet, 2 (two) Tablet SEE NOTE, #6, 09/30/2017, No Refill. Local Order: TAKE TWO TABLETS AT 2 PM, 3 PM, AND 10 PM THE DAY PRIOR TO SURGERY Started Flagyl 500 MG Oral Tablet, 2 (two) Tablet SEE NOTE, #6, 09/30/2017, No Refill. Local Order: Take at 2pm, 3pm, and 10pm the day prior to your colon operation Pt Education - Pamphlet Given - Laparoscopic Colorectal Surgery: discussed with patient and provided information. Pt Education - [facility] Colectomy post-op instructions: discussed with patient and provided information.  RIGHT INGUINAL HERNIA  (K40.90) Impression: Not particularly large and a symptomatically. Would hold off on any intervention at this time. Certainly would not try and repair at the same time of colectomy.  In the future, he may benefit from elective repair with mesh. We'll hold off for now  [name], M.D., F.A.C.S. Gastrointestinal and Minimally Invasive Surgery [facility] [address] [place], [place]  [address] [phone] Main / Paging

## 2017-10-09 ENCOUNTER — Telehealth: Payer: Self-pay | Admitting: Family Medicine

## 2017-10-09 NOTE — Telephone Encounter (Signed)
Copied from [place]. Topic: Quick Communication - Office Called Patient >> Oct 08, 2017 10:53 AM [place], [place], [place] wrote: Reason for CRM: Called and left a message for daughter [name] asking that she call and schedule a surgical clearance appointment for her dad. We received a request from [facility] for surgical clearance for colon surgery. Please schedule appointment. If possible for daughter to be here that would be great as patient has memory issues  >> Oct 08, 2017  2:19 PM Valla Leaver wrote: Daughter [name] called back and scheduled surgical clearance w/ Yong Channel on Thursday May 9th b/c she believes the surgery is scheduled for 05/15th. She is only off on Thursday's and wants to know if he can be worked in on a sooner Thursday w/ [place]?

## 2017-10-09 NOTE — Telephone Encounter (Signed)
Called and left daughter a voicemail message letting her know that May 9th would be the soonest due to the schedule. Provided a call back number for questions

## 2017-10-09 NOTE — Telephone Encounter (Signed)
See note

## 2017-11-18 NOTE — Patient Instructions (Signed)
Calvin Burnett  11/18/2017   Your procedure is scheduled on: 11-26-17   Report to [facility] Main  Entrance     Report to admitting at 6:30AM    Call this number if you have problems the morning of surgery [phone]     Remember: Do not eat food :After Midnight on Monday 11-24-17. Drink plenty of clear liquids all day Tuesday 11-25-17 and follow all bowel prep instructions provided by your surgeon. DRINK 2 PRESURGERY ENSURE DRINKS THE NIGHT BEFORE SURGERY AT 10:00 PM. Continue drinking Clear liquids up to 3 hours prior to surgery. Finish any shakes 3 hours prior to surgery at 5:30AM. Nothing by mouth after last shake .        Take these medicines the morning of surgery with A SIP OF WATER: NONE                                 You may not have any metal on your body including hair pins and              piercings  Do not wear jewelry, make-up, lotions, powders or perfumes, deodorant                      Men may shave face and neck.   Do not bring valuables to the hospital. [place].  Contacts, dentures or bridgework may not be worn into surgery.  Leave suitcase in the car. After surgery it may be brought to your room.                  Please read over the following fact sheets you were given: _____________________________________________________________________     CLEAR LIQUID DIET   Foods Allowed                                                                     Foods Excluded  Coffee and tea, regular and decaf                             liquids that you cannot  Plain Jell-O in any flavor                                             see through such as: Fruit ices (not with fruit pulp)                                     milk, soups, orange juice  Iced Popsicles                                    All solid food Carbonated beverages, regular and  diet                                     Cranberry, grape and apple juices Sports drinks like Gatorade Lightly seasoned clear broth or consume(fat free) Sugar, honey syrup  Sample Menu Breakfast                                Lunch                                     Supper Cranberry juice                    Beef broth                            Chicken broth Jell-O                                     Grape juice                           Apple juice Coffee or tea                        Jell-O                                      Popsicle                                                Coffee or tea                        Coffee or tea  _____________________________________________________________________  How to Manage Your Diabetes Before and After Surgery  Why is it important to control my blood sugar before and after surgery? . Improving blood sugar levels before and after surgery helps healing and can limit problems. . A way of improving blood sugar control is eating a healthy diet by: o  Eating less sugar and carbohydrates o  Increasing activity/exercise o  Talking with your doctor about reaching your blood sugar goals . High blood sugars (greater than 180 mg/dL) can raise your risk of infections and slow your recovery, so you will need to focus on controlling your diabetes during the weeks before surgery. . Make sure that the doctor who takes care of your diabetes knows about your planned surgery including the date and location.   Patient Signature:  Date:   Nurse Signature:  Date:   Reviewed and Endorsed by [facility] Patient Education Committee, August 2015   [facility] - Preparing for Surgery Before surgery, you can play an important role.  Because skin is not sterile, your skin needs to be as free of germs as possible.  You can reduce the number of germs on your skin by washing with CHG (chlorahexidine gluconate) soap before surgery.  CHG is an antiseptic cleaner which kills germs and bonds with the skin to  continue killing germs even after washing. Please DO NOT use if you have an allergy to CHG or antibacterial soaps.  If your skin becomes reddened/irritated stop using the CHG and inform your nurse when you arrive at Short Stay. Do not shave (including legs and underarms) for at least 48 hours prior to the first CHG shower.  You may shave your face/neck. Please follow these instructions carefully:  1.  Shower with CHG Soap the night before surgery and the  morning of Surgery.  2.  If you choose to wash your hair, wash your hair first as usual with your  normal  shampoo.  3.  After you shampoo, rinse your hair and body thoroughly to remove the  shampoo.                           4.  Use CHG as you would any other liquid soap.  You can apply chg directly  to the skin and wash                       Gently with a scrungie or clean washcloth.  5.  Apply the CHG Soap to your body ONLY FROM THE NECK DOWN.   Do not use on face/ open                           Wound or open sores. Avoid contact with eyes, ears mouth and genitals (private parts).                       Wash face,  Genitals (private parts) with your normal soap.             6.  Wash thoroughly, paying special attention to the area where your surgery  will be performed.  7.  Thoroughly rinse your body with warm water from the neck down.  8.  DO NOT shower/wash with your normal soap after using and rinsing off  the CHG Soap.                9.  Pat yourself dry with a clean towel.            10.  Wear clean pajamas.            11.  Place clean sheets on your bed the night of your first shower and do not  sleep with pets. Day of Surgery : Do not apply any lotions/deodorants the morning of surgery.  Please wear clean clothes to the hospital/surgery center.  FAILURE TO FOLLOW THESE INSTRUCTIONS MAY RESULT IN THE CANCELLATION OF YOUR SURGERY PATIENT SIGNATURE_________________________________  NURSE  SIGNATURE__________________________________  ________________________________________________________________________   Calvin Burnett  An incentive spirometer is a tool that can help keep your lungs clear and active. This tool measures how well you are filling your lungs with each breath. Taking long deep breaths may help reverse or decrease the chance of developing breathing (pulmonary) problems (especially infection) following:  A long period of time when you are unable to move or be active. BEFORE THE PROCEDURE   If the spirometer includes an indicator to show your best effort, your nurse or respiratory therapist will set it to a desired goal.  If possible, sit up straight or lean slightly forward. Try not to slouch.  Hold the incentive spirometer in an upright position. INSTRUCTIONS FOR USE  1. Sit on the edge of your bed if possible, or sit up as far as you can in bed or on a chair. 2. Hold the incentive spirometer in an upright position. 3. Breathe out normally. 4. Place the mouthpiece in your mouth and seal your lips tightly around it. 5. Breathe in slowly and as deeply as possible, raising the piston or the ball toward the top of the column. 6. Hold your breath for 3-5 seconds or for as long as possible. Allow the piston or ball to fall to the bottom of the column. 7. Remove the mouthpiece from your mouth and breathe out normally. 8. Rest for a few seconds and repeat Steps 1 through 7 at least 10 times every 1-2 hours when you are awake. Take your time and take a few normal breaths between deep breaths. 9. The spirometer may include an indicator to show your best effort. Use the indicator as a goal to work toward during each repetition. 10. After each set of 10 deep breaths, practice coughing to be sure your lungs are clear. If you have an incision (the cut made at the time of surgery), support your incision when coughing by placing a pillow or rolled up towels firmly  against it. Once you are able to get out of bed, walk around indoors and cough well. You may stop using the incentive spirometer when instructed by your caregiver.  RISKS AND COMPLICATIONS  Take your time so you do not get dizzy or light-headed.  If you are in pain, you may need to take or ask for pain medication before doing incentive spirometry. It is harder to take a deep breath if you are having pain. AFTER USE  Rest and breathe slowly and easily.  It can be helpful to keep track of a log of your progress. Your caregiver can provide you with a simple table to help with this. If you are using the spirometer at home, follow these instructions: [place] IF:   You are having difficultly using the spirometer.  You have trouble using the spirometer as often as instructed.  Your pain medication is not giving enough relief while using the spirometer.  You develop fever of 100.5 F (38.1 C) or higher. SEEK IMMEDIATE MEDICAL CARE IF:   You cough up bloody sputum that had not been present before.  You develop fever of 102 F (38.9 C) or greater.  You develop worsening pain at or near the incision site. MAKE SURE YOU:   Understand these instructions.  Will watch your condition.  Will get help right away if you are not doing well or get worse. Document Released: 11/11/2006 Document Revised: 09/23/2011 Document Reviewed: 01/12/2007 [facility] Patient Information 2014 [place], [place].   ________________________________________________________________________

## 2017-11-18 NOTE — Progress Notes (Signed)
Cardiac clearance 09-30-17 epic telephone note   [facility] cardiology Almyra Deforest PA 08-29-17 epic  EKG 08-29-17 epic   CT Chest 06-11-17 epic

## 2017-11-20 ENCOUNTER — Ambulatory Visit (INDEPENDENT_AMBULATORY_CARE_PROVIDER_SITE_OTHER): Payer: PPO | Admitting: Family Medicine

## 2017-11-20 ENCOUNTER — Encounter: Payer: Self-pay | Admitting: Family Medicine

## 2017-11-20 ENCOUNTER — Inpatient Hospital Stay (HOSPITAL_COMMUNITY)
Admission: RE | Admit: 2017-11-20 | Discharge: 2017-11-20 | Disposition: A | Discharge status: Home / Self Care | Payer: Self-pay | Source: Ambulatory Visit

## 2017-11-20 VITALS — BP 132/86 | HR 67 | Temp 97.8°F | Ht 69.0 in | Wt 176.4 lb

## 2017-11-20 DIAGNOSIS — F0280 Dementia in other diseases classified elsewhere without behavioral disturbance: Secondary | ICD-10-CM | POA: Diagnosis not present

## 2017-11-20 DIAGNOSIS — E119 Type 2 diabetes mellitus without complications: Secondary | ICD-10-CM | POA: Diagnosis not present

## 2017-11-20 DIAGNOSIS — G301 Alzheimer's disease with late onset: Secondary | ICD-10-CM

## 2017-11-20 DIAGNOSIS — I4891 Unspecified atrial fibrillation: Secondary | ICD-10-CM | POA: Diagnosis not present

## 2017-11-20 DIAGNOSIS — R413 Other amnesia: Secondary | ICD-10-CM

## 2017-11-20 DIAGNOSIS — F325 Major depressive disorder, single episode, in full remission: Secondary | ICD-10-CM | POA: Diagnosis not present

## 2017-11-20 DIAGNOSIS — I480 Paroxysmal atrial fibrillation: Secondary | ICD-10-CM

## 2017-11-20 DIAGNOSIS — I10 Essential (primary) hypertension: Secondary | ICD-10-CM | POA: Diagnosis not present

## 2017-11-20 LAB — MICROALBUMIN / CREATININE URINE RATIO
CREATININE, U: 116.1 mg/dL
MICROALB UR: 30.6 mg/dL — AB (ref 0.0–1.9)
MICROALB/CREAT RATIO: 26.3 mg/g (ref 0.0–30.0)

## 2017-11-20 NOTE — Assessment & Plan Note (Signed)
S: Diet controlled on no medication. Does not check cbgs Lab Results  Component Value Date   HGBA1C 6.7 07/28/2017   HGBA1C 6.3 04/30/2017   HGBA1C 6.7 (H) 09/16/2016   A/P: update a1c prior to surgery- ok to proceed with surgery as long as 7.5 or less

## 2017-11-20 NOTE — Assessment & Plan Note (Signed)
PHQ 9 of 2 today.  Depression not likely the cause of memory loss.  Remains in full remission

## 2017-11-20 NOTE — Assessment & Plan Note (Signed)
Memory loss S: daughter with patient. She has noted worsening in memory over last 6 months. He may forget car keys, or forget what day it is but usually comes back and remembers. hasnt gotten lost with driving- no issues with paying bills at this point Patient had been on namenda back in 2015 per Dr. Arnoldo Burnett - without clear diagnosis or workup for reversible causes of dementia. He ended up stopping medicine in 2017 and MMSE was stable at 26/30 at that time.   TOday MMSE is 21/30 (loses 3 for orientation, loses 3 for attention (world), loses 2 for 3 word recall, loses 1 for design)  A/P: MMSE has progressed to 21 out of 30 from 26 out of 30 previously.  This likely represents Alzheimer's dementia.  We will complete reversible cause of dementia work-up with phq9, MMSE, CBC, CMET, RPR, HIV, TSH, B12, MRI.  Neuro exam reassuring today.  Offered neurology referral.  They would prefer to do work-up and treatment here.  Once we get labs and MRI back we will plan on starting Aricept most likely.  Would need blood pressure recheck within 4 to 6 weeks

## 2017-11-20 NOTE — Progress Notes (Addendum)
Subjective:  Calvin Burnett is a 84 y.o. year old very pleasant male patient who presents for/with See problem oriented charting ROS-notes memory loss.  No chest pain or shortness of breath.  No edema.  Past Medical History-  Patient Active Problem List   Diagnosis Date Noted  . Paroxysmal atrial fibrillation ([place]) 04/09/2017    Priority: High  . Night sweats 09/16/2016    Priority: High  . Major depressive disorder with single episode, in full remission ([place]) 07/29/2016    Priority: High  . Rectal pain 10/14/2014    Priority: High  . Prostate cancer ([place]) 08/12/2012    Priority: High  . Diabetes mellitus type II, controlled ([place]) 07/31/2010    Priority: High  . Alzheimer's dementia 07/27/2008    Priority: High  . History of adenomatous polyp of colon 10/27/2014    Priority: Medium  . Syncope 09/27/2014    Priority: Medium  . CKD (chronic kidney disease), stage III ([place]) 06/01/2014    Priority: Medium  . Dyslipidemia 03/01/2014    Priority: Medium  . Basal cell carcinoma 05/03/2009    Priority: Medium  . Essential hypertension 01/30/2007    Priority: Medium  . Bowel habit changes 10/14/2014    Priority: Low  . Bradycardia 09/27/2014    Priority: Low  . LEG CRAMPS, NOCTURNAL 04/30/2010    Priority: Low  . Cervical spondylosis without myelopathy 12/11/2007    Priority: Low  . GERD 11/05/2007    Priority: Low  . Adenomatous polyp of sigmoid colon 10/02/2017  . Right inguinal hernia 10/02/2017    Medications- reviewed and updated Current Outpatient Medications  Medication Sig Dispense Refill  . acetaminophen (TYLENOL) 500 MG tablet Take 500 mg by mouth daily as needed for moderate pain or headache.    Marland Kitchen apixaban (ELIQUIS) 5 MG TABS tablet Take 1 tablet (5 mg total) by mouth 2 (two) times daily. 60 tablet 11  . latanoprost (XALATAN) 0.005 % ophthalmic solution PLACE 1 DROP INTO BOTH EYES NIGHTLY.  12  . MELATONIN PO Take 1 tablet by mouth at bedtime as needed (sleep).      Current Facility-Administered Medications  Medication Dose Route Frequency Provider Last Rate Last Dose  . 0.9 %  sodium chloride infusion  500 mL Intravenous Once Ladene Artist, MD        Objective: BP 132/86 (BP Location: Right Arm, Patient Position: Sitting, Cuff Size: Normal)   Pulse 67   Temp 97.8 F (36.6 C) (Oral)   Ht 5\' 9"  (1.753 m)   Wt 176 lb 6.4 oz (80 kg)   SpO2 95%   BMI 26.05 kg/m  Gen: NAD, resting comfortably CV: no murmurs rubs or gallops Lungs: CTAB no crackles, wheeze, rhonchi Abdomen: soft/nontender/nondistended/normal bowel sounds. No rebound or guarding.  Ext: no edema Skin: warm, dry Neuro: CN II-XII intact, sensation and reflexes normal throughout, 5/5 muscle strength in bilateral upper and lower extremities. Normal finger to nose. Normal rapid alternating movements. No pronator drift. Normal romberg. Normal gait.   Assessment/Plan:  Patient presents for preoperative evaluation before surgical removal of colon polyp and right groin hernia surgery by Dr. Johney [place].  From history he is medically maximized for surgery-we will get lab work to also confirm this- he is returning for those labs on 12/04/17.  He is able to climb a flight of steps or go up a reasonably steep incline without chest pain or shortness of breath.  He walks regularly.  Since he can complete 4 METS  of activity without chest pain or shortness of breath he can proceed with surgery as long as labs are okay.  Paroxysmal atrial fibrillation ([name]) S: Patient with history of atrial fibrillation.  He is on Eliquis 5 mg twice daily.  Patient has been rate controlled without medications.  He experienced bradycardia on AV nodal blocking agent.  No history of heart attack or stroke A/P: ok to hold eliquis 2-3 days prior to procedure and restart eliquis 48 hours after procedure. We discussed there is a low but real risk of stroke in this interval but the surgeries are important so we will move forward.  Given no history of CVA - I am ok with him not using a bridge for his surgery.   Diabetes mellitus type II, controlled ([place]) S: Diet controlled on no medication. Does not check cbgs Lab Results  Component Value Date   HGBA1C 6.7 07/28/2017   HGBA1C 6.3 04/30/2017   HGBA1C 6.7 (H) 09/16/2016   A/P: update a1c prior to surgery- ok to proceed with surgery as long as 7.5 or less  Essential hypertension S: controlled on no medication-in the past was on ramipril.  We took him off to see if it would help with his night sweats.   he also has chronic kidney disease stage III BP Readings from Last 3 Encounters:  09/03/17 113/81  08/29/17 (!) 142/90  08/19/17 138/90  A/P: We discussed blood pressure goal of <140/90. Continue current meds:  Update GFR. If elevated microalbumin/creatinine ratio then would need to restart ace- i  Alzheimer's dementia Memory loss S: daughter with patient. She has noted worsening in memory over last 6 months. He may forget car keys, or forget what day it is but usually comes back and remembers. hasnt gotten lost with driving- no issues with paying bills at this point Patient had been on namenda back in 2015 per Dr. Arnoldo Morale - without clear diagnosis or workup for reversible causes of dementia. He ended up stopping medicine in 2017 and MMSE was stable at 26/30 at that time.   TOday MMSE is 21/30 (loses 3 for orientation, loses 3 for attention (world), loses 2 for 3 word recall, loses 1 for design)  A/P: MMSE has progressed to 21 out of 30 from 26 out of 30 previously.  This likely represents Alzheimer's dementia.  We will complete reversible cause of dementia work-up with phq9, MMSE, CBC, CMET, RPR, HIV, TSH, B12, MRI.  Neuro exam reassuring today.  Offered neurology referral.  They would prefer to do work-up and treatment here.  Once we get labs and MRI back we will plan on starting Aricept most likely.  Would need blood pressure recheck within 4 to 6 weeks   Major  depressive disorder with single episode, in full remission ([place]) PHQ 9 of 2 today.  Depression not likely the cause of memory loss.  Remains in full remission   Future Appointments  Date Time Provider [place]  11/25/2017 11:00 AM [facility] PAT 2 [facility] None  12/04/2017 10:00 AM [facility] LAB [facility]  03/06/2018  1:00 PM Williemae Area, RN [facility]   Return in about 3 months (around 02/20/2018).  Lab/Order associations: Controlled type 2 diabetes mellitus without complication, without long-term current use of insulin ([name]) - Plan: Hemoglobin A1c, CBC, Comprehensive metabolic panel, Lipid panel, Microalbumin / creatinine urine ratio  Memory loss - Plan: HIV antibody, RPR, TSH, Vitamin B12, MR Brain W Wo Contrast   No orders of the defined types were  placed in this encounter.   Return precautions advised.  [name], MD

## 2017-11-20 NOTE — Progress Notes (Signed)
Normal urine diabetes test-No leakage of protein into urine related to diabetes- with that being said # is creeping up and appears 9 years ago was elevated- suspect by next year we will need to restart medicine like ramipril or lisinopril

## 2017-11-20 NOTE — Patient Instructions (Addendum)
Schedule a lab visit at the check out desk within 2 weeks. Return for future fasting labs meaning nothing but water after midnight please. Ok to take your medications with water.   Please stop by lab before you go- urine test only today. Will return for bloodwork  We will call you within a week or two about your referral for MRI. If you do not hear within 3 weeks, give [place] a call.   If MRI and blood work do not show causes of the memory loss-we will start medication called Aricept most likely.  Then we would have you back within 4 to 8 weeks for blood pressure recheck  At the checkout desk please have them change primary # to  your daughter's number since you want her to be contacted first.

## 2017-11-20 NOTE — Assessment & Plan Note (Addendum)
S: Patient with history of atrial fibrillation.  He is on Eliquis 5 mg twice daily.  Patient has been rate controlled without medications.  He experienced bradycardia on AV nodal blocking agent.  No history of heart attack or stroke A/P: ok to hold eliquis 2-3 days prior to procedure and restart eliquis 48 hours after procedure. We discussed there is a low but real risk of stroke in this interval but the surgeries are important so we will move forward. Given no history of CVA - I am ok with him not using a bridge for his surgery.

## 2017-11-20 NOTE — Assessment & Plan Note (Signed)
S: controlled on no medication-in the past was on ramipril.  We took him off to see if it would help with his night sweats.   he also has chronic kidney disease stage III BP Readings from Last 3 Encounters:  09/03/17 113/81  08/29/17 (!) 142/90  08/19/17 138/90  A/P: We discussed blood pressure goal of <140/90. Continue current meds:  Update GFR. If elevated microalbumin/creatinine ratio then would need to restart ace- i

## 2017-11-21 ENCOUNTER — Other Ambulatory Visit: Payer: PPO

## 2017-11-24 NOTE — Patient Instructions (Addendum)
Calvin Burnett  11/24/2017   Your procedure is scheduled on: 11-26-17   Report to [facility] Main  Entrance    Report to admitting at 6:30 AM    Call this number if you have problems the morning of surgery [phone]   Remember: DRINK 2 [place] AT 1000 PM AND 1 PRESURGERY DRINK THE DAY OF THE PROCEDURE 3 HOURS PRIOR TO SCHEDULED SURGERY.  NOTHING BY MOUTH EXCEPT CLEAR LIQUIDS UNTIL THREE HOURS PRIOR TO SCHEDULED SURGERY. PLEASE FINISH PRESURGERY ENSURE DRINK PER SURGEON ORDER 3 HOURS PRIOR TO SCHEDULED SURGERY TIME WHICH NEEDS TO BE COMPLETED AT 5:30 AM.     CLEAR LIQUID DIET   Foods Allowed                                                                     Foods Excluded  Coffee and tea, regular and decaf                             liquids that you cannot  Plain Jell-O in any flavor                                             see through such as: Fruit ices (not with fruit pulp)                                     milk, soups, orange juice  Iced Popsicles                                    All solid food Carbonated beverages, regular and diet                                    Cranberry, grape and apple juices Sports drinks like Gatorade Lightly seasoned clear broth or consume(fat free) Sugar, honey syrup  Sample Menu Breakfast                                Lunch                                     Supper Cranberry juice                    Beef broth                            Chicken broth Jell-O  Grape juice                           Apple juice Coffee or tea                        Jell-O                                      Popsicle                                                Coffee or tea                        Coffee or tea  _____________________________________________________________________     Take these medicines the morning of surgery with A SIP OF WATER:  None                                You may not have any metal on your body including hair pins and              piercings  Do not wear jewelry, lotions, powders or deodorant             Do not wear nail polish.  Do not shave  48 hours prior to surgery.                 Do not bring valuables to the hospital. [place].  Contacts, dentures or bridgework may not be worn into surgery.  Leave suitcase in the car. After surgery it may be brought to your room.      Special Instructions: Please consume a Clear Liquid Diet the Day of Prep              Please read over the following fact sheets you were given: _____________________________________________________________________             [facility] - Preparing for Surgery Before surgery, you can play an important role.  Because skin is not sterile, your skin needs to be as free of germs as possible.  You can reduce the number of germs on your skin by washing with CHG (chlorahexidine gluconate) soap before surgery.  CHG is an antiseptic cleaner which kills germs and bonds with the skin to continue killing germs even after washing. Please DO NOT use if you have an allergy to CHG or antibacterial soaps.  If your skin becomes reddened/irritated stop using the CHG and inform your nurse when you arrive at Short Stay. Do not shave (including legs and underarms) for at least 48 hours prior to the first CHG shower.  You may shave your face/neck. Please follow these instructions carefully:  1.  Shower with CHG Soap the night before surgery and the  morning of Surgery.  2.  If you choose to wash your hair, wash your hair first as usual with your  normal  shampoo.  3.  After you shampoo, rinse your hair and body thoroughly to remove the  shampoo.  4.  Use CHG as you would any other liquid soap.  You can apply chg directly  to the skin and wash                       Gently with a  scrungie or clean washcloth.  5.  Apply the CHG Soap to your body ONLY FROM THE NECK DOWN.   Do not use on face/ open                           Wound or open sores. Avoid contact with eyes, ears mouth and genitals (private parts).                       Wash face,  Genitals (private parts) with your normal soap.             6.  Wash thoroughly, paying special attention to the area where your surgery  will be performed.  7.  Thoroughly rinse your body with warm water from the neck down.  8.  DO NOT shower/wash with your normal soap after using and rinsing off  the CHG Soap.                9.  Pat yourself dry with a clean towel.            10.  Wear clean pajamas.            11.  Place clean sheets on your bed the night of your first shower and do not  sleep with pets. Day of Surgery : Do not apply any lotions/deodorants the morning of surgery.  Please wear clean clothes to the hospital/surgery center.  FAILURE TO FOLLOW THESE INSTRUCTIONS MAY RESULT IN THE CANCELLATION OF YOUR SURGERY PATIENT SIGNATURE_________________________________  NURSE SIGNATURE__________________________________  ________________________________________________________________________  WHAT IS A BLOOD TRANSFUSION? Blood Transfusion Information  A transfusion is the replacement of blood or some of its parts. Blood is made up of multiple cells which provide different functions.  Red blood cells carry oxygen and are used for blood loss replacement.  White blood cells fight against infection.  Platelets control bleeding.  Plasma helps clot blood.  Other blood products are available for specialized needs, such as hemophilia or other clotting disorders. BEFORE THE TRANSFUSION  Who gives blood for transfusions?   Healthy volunteers who are fully evaluated to make sure their blood is safe. This is blood bank blood. Transfusion therapy is the safest it has ever been in the practice of medicine. Before blood is taken  from a donor, a complete history is taken to make sure that person has no history of diseases nor engages in risky social behavior (examples are intravenous drug use or sexual activity with multiple partners). The donor's travel history is screened to minimize risk of transmitting infections, such as malaria. The donated blood is tested for signs of infectious diseases, such as HIV and hepatitis. The blood is then tested to be sure it is compatible with you in order to minimize the chance of a transfusion reaction. If you or a relative donates blood, this is often done in anticipation of surgery and is not appropriate for emergency situations. It takes many days to process the donated blood. RISKS AND COMPLICATIONS Although transfusion therapy is very safe and saves many lives, the main dangers of transfusion include:   Getting an infectious disease.  Developing a transfusion reaction.  This is an allergic reaction to something in the blood you were given. Every precaution is taken to prevent this. The decision to have a blood transfusion has been considered carefully by your caregiver before blood is given. Blood is not given unless the benefits outweigh the risks. AFTER THE TRANSFUSION  Right after receiving a blood transfusion, you will usually feel much better and more energetic. This is especially true if your red blood cells have gotten low (anemic). The transfusion raises the level of the red blood cells which carry oxygen, and this usually causes an energy increase.  The nurse administering the transfusion will monitor you carefully for complications. HOME CARE INSTRUCTIONS  No special instructions are needed after a transfusion. You may find your energy is better. Speak with your caregiver about any limitations on activity for underlying diseases you may have. SEEK MEDICAL CARE IF:   Your condition is not improving after your transfusion.  You develop redness or irritation at the  intravenous (IV) site. SEEK IMMEDIATE MEDICAL CARE IF:  Any of the following symptoms occur over the next 12 hours:  Shaking chills.  You have a temperature by mouth above 102 F (38.9 C), not controlled by medicine.  Chest, back, or muscle pain.  People around you feel you are not acting correctly or are confused.  Shortness of breath or difficulty breathing.  Dizziness and fainting.  You get a rash or develop hives.  You have a decrease in urine output.  Your urine turns a dark color or changes to pink, red, or brown. Any of the following symptoms occur over the next 10 days:  You have a temperature by mouth above 102 F (38.9 C), not controlled by medicine.  Shortness of breath.  Weakness after normal activity.  The white part of the eye turns yellow (jaundice).  You have a decrease in the amount of urine or are urinating less often.  Your urine turns a dark color or changes to pink, red, or brown. Document Released: 06/28/2000 Document Revised: 09/23/2011 Document Reviewed: 02/15/2008 [facility] Patient Information 2014 [place], [place].  _______________________________________________________________________

## 2017-11-25 ENCOUNTER — Other Ambulatory Visit: Payer: Self-pay

## 2017-11-25 ENCOUNTER — Encounter (HOSPITAL_COMMUNITY)
Admission: RE | Admit: 2017-11-25 | Discharge: 2017-11-25 | Disposition: A | Discharge status: Home / Self Care | Payer: PPO | Source: Ambulatory Visit | Attending: Surgery | Admitting: Surgery

## 2017-11-25 ENCOUNTER — Encounter (HOSPITAL_COMMUNITY): Payer: Self-pay

## 2017-11-25 ENCOUNTER — Ambulatory Visit: Payer: PPO | Admitting: Family Medicine

## 2017-11-25 DIAGNOSIS — D62 Acute posthemorrhagic anemia: Secondary | ICD-10-CM | POA: Diagnosis not present

## 2017-11-25 DIAGNOSIS — G309 Alzheimer's disease, unspecified: Secondary | ICD-10-CM | POA: Diagnosis present

## 2017-11-25 DIAGNOSIS — E1122 Type 2 diabetes mellitus with diabetic chronic kidney disease: Secondary | ICD-10-CM | POA: Diagnosis present

## 2017-11-25 DIAGNOSIS — Z7901 Long term (current) use of anticoagulants: Secondary | ICD-10-CM | POA: Diagnosis not present

## 2017-11-25 DIAGNOSIS — C19 Malignant neoplasm of rectosigmoid junction: Secondary | ICD-10-CM | POA: Diagnosis present

## 2017-11-25 DIAGNOSIS — Z86010 Personal history of colonic polyps: Secondary | ICD-10-CM

## 2017-11-25 DIAGNOSIS — C184 Malignant neoplasm of transverse colon: Secondary | ICD-10-CM | POA: Diagnosis not present

## 2017-11-25 DIAGNOSIS — R0600 Dyspnea, unspecified: Secondary | ICD-10-CM | POA: Diagnosis not present

## 2017-11-25 DIAGNOSIS — Z9841 Cataract extraction status, right eye: Secondary | ICD-10-CM | POA: Diagnosis not present

## 2017-11-25 DIAGNOSIS — R000 Tachycardia, unspecified: Secondary | ICD-10-CM | POA: Diagnosis not present

## 2017-11-25 DIAGNOSIS — I959 Hypotension, unspecified: Secondary | ICD-10-CM | POA: Diagnosis not present

## 2017-11-25 DIAGNOSIS — K5730 Diverticulosis of large intestine without perforation or abscess without bleeding: Secondary | ICD-10-CM | POA: Diagnosis present

## 2017-11-25 DIAGNOSIS — Z8546 Personal history of malignant neoplasm of prostate: Secondary | ICD-10-CM | POA: Diagnosis not present

## 2017-11-25 DIAGNOSIS — R269 Unspecified abnormalities of gait and mobility: Secondary | ICD-10-CM | POA: Diagnosis not present

## 2017-11-25 DIAGNOSIS — K219 Gastro-esophageal reflux disease without esophagitis: Secondary | ICD-10-CM | POA: Diagnosis present

## 2017-11-25 DIAGNOSIS — Z961 Presence of intraocular lens: Secondary | ICD-10-CM | POA: Diagnosis present

## 2017-11-25 DIAGNOSIS — K4090 Unilateral inguinal hernia, without obstruction or gangrene, not specified as recurrent: Secondary | ICD-10-CM | POA: Diagnosis present

## 2017-11-25 DIAGNOSIS — T8089XA Other complications following infusion, transfusion and therapeutic injection, initial encounter: Secondary | ICD-10-CM | POA: Diagnosis not present

## 2017-11-25 DIAGNOSIS — Y848 Other medical procedures as the cause of abnormal reaction of the patient, or of later complication, without mention of misadventure at the time of the procedure: Secondary | ICD-10-CM | POA: Diagnosis not present

## 2017-11-25 DIAGNOSIS — C182 Malignant neoplasm of ascending colon: Secondary | ICD-10-CM | POA: Diagnosis not present

## 2017-11-25 DIAGNOSIS — T8092XA Unspecified transfusion reaction, initial encounter: Secondary | ICD-10-CM | POA: Diagnosis not present

## 2017-11-25 DIAGNOSIS — E1140 Type 2 diabetes mellitus with diabetic neuropathy, unspecified: Secondary | ICD-10-CM | POA: Diagnosis present

## 2017-11-25 DIAGNOSIS — I10 Essential (primary) hypertension: Secondary | ICD-10-CM | POA: Diagnosis not present

## 2017-11-25 DIAGNOSIS — D120 Benign neoplasm of cecum: Secondary | ICD-10-CM | POA: Diagnosis present

## 2017-11-25 DIAGNOSIS — Z9842 Cataract extraction status, left eye: Secondary | ICD-10-CM | POA: Diagnosis not present

## 2017-11-25 DIAGNOSIS — I129 Hypertensive chronic kidney disease with stage 1 through stage 4 chronic kidney disease, or unspecified chronic kidney disease: Secondary | ICD-10-CM | POA: Diagnosis present

## 2017-11-25 DIAGNOSIS — I480 Paroxysmal atrial fibrillation: Secondary | ICD-10-CM | POA: Diagnosis present

## 2017-11-25 DIAGNOSIS — K565 Intestinal adhesions [bands], unspecified as to partial versus complete obstruction: Secondary | ICD-10-CM | POA: Diagnosis not present

## 2017-11-25 DIAGNOSIS — N183 Chronic kidney disease, stage 3 (moderate): Secondary | ICD-10-CM | POA: Diagnosis present

## 2017-11-25 DIAGNOSIS — Z923 Personal history of irradiation: Secondary | ICD-10-CM

## 2017-11-25 DIAGNOSIS — C61 Malignant neoplasm of prostate: Secondary | ICD-10-CM | POA: Diagnosis not present

## 2017-11-25 DIAGNOSIS — D125 Benign neoplasm of sigmoid colon: Secondary | ICD-10-CM | POA: Diagnosis present

## 2017-11-25 DIAGNOSIS — K921 Melena: Secondary | ICD-10-CM | POA: Diagnosis not present

## 2017-11-25 DIAGNOSIS — Z87891 Personal history of nicotine dependence: Secondary | ICD-10-CM | POA: Diagnosis not present

## 2017-11-25 DIAGNOSIS — R0602 Shortness of breath: Secondary | ICD-10-CM | POA: Diagnosis not present

## 2017-11-25 DIAGNOSIS — Z85828 Personal history of other malignant neoplasm of skin: Secondary | ICD-10-CM

## 2017-11-25 DIAGNOSIS — K922 Gastrointestinal hemorrhage, unspecified: Secondary | ICD-10-CM | POA: Diagnosis not present

## 2017-11-25 DIAGNOSIS — F0280 Dementia in other diseases classified elsewhere without behavioral disturbance: Secondary | ICD-10-CM | POA: Diagnosis present

## 2017-11-25 DIAGNOSIS — R6883 Chills (without fever): Secondary | ICD-10-CM | POA: Diagnosis not present

## 2017-11-25 DIAGNOSIS — R0789 Other chest pain: Secondary | ICD-10-CM | POA: Diagnosis not present

## 2017-11-25 DIAGNOSIS — D123 Benign neoplasm of transverse colon: Secondary | ICD-10-CM | POA: Diagnosis not present

## 2017-11-25 LAB — CBC
HEMATOCRIT: 43.8 % (ref 39.0–52.0)
Hemoglobin: 15 g/dL (ref 13.0–17.0)
MCH: 31.1 pg (ref 26.0–34.0)
MCHC: 34.2 g/dL (ref 30.0–36.0)
MCV: 90.7 fL (ref 78.0–100.0)
PLATELETS: 194 10*3/uL (ref 150–400)
RBC: 4.83 MIL/uL (ref 4.22–5.81)
RDW: 13.2 % (ref 11.5–15.5)
WBC: 6.7 10*3/uL (ref 4.0–10.5)

## 2017-11-25 LAB — HEMOGLOBIN A1C
Hgb A1c MFr Bld: 6.8 % — ABNORMAL HIGH (ref 4.8–5.6)
Mean Plasma Glucose: 148.46 mg/dL

## 2017-11-25 LAB — BASIC METABOLIC PANEL
ANION GAP: 9 (ref 5–15)
BUN: 17 mg/dL (ref 6–20)
CO2: 26 mmol/L (ref 22–32)
Calcium: 9.1 mg/dL (ref 8.9–10.3)
Chloride: 107 mmol/L (ref 101–111)
Creatinine, Ser: 1.3 mg/dL — ABNORMAL HIGH (ref 0.61–1.24)
GFR calc Af Amer: 58 mL/min — ABNORMAL LOW (ref >60)
GFR calc non Af Amer: 50 mL/min — ABNORMAL LOW (ref >60)
Glucose, Bld: 115 mg/dL — ABNORMAL HIGH (ref 65–99)
POTASSIUM: 4.8 mmol/L (ref 3.5–5.1)
Sodium: 142 mmol/L (ref 135–145)

## 2017-11-25 LAB — ABO/RH: ABO/RH(D): O POS

## 2017-11-25 MED ORDER — BUPIVACAINE LIPOSOME 1.3 % IJ SUSP
20.0000 mL | INTRAMUSCULAR | Status: DC
  Filled 2017-11-25: qty 20

## 2017-11-25 NOTE — Progress Notes (Signed)
Pt reported at PAT today, that he had not started the prep instructions for his procedure which is scheduled on 11-26-17 . [place] and advised of the above. Prep instructions received via fax and given to patient and his sister. Sister reports that patient has beginning stage of Alzheimer. Pt's daughter will be coming in tonight from [place] for the surgery.Patient's sister will go over pre-op and prep instructions with patient's daughter tonight.

## 2017-11-25 NOTE — Consult Note (Signed)
[place] Nurse requested for preoperative stoma site marking  Discussed surgical procedure and stoma creation with patient and family.  Explained role of the [place] nurse team.  Provided the patient with educational booklet and provided samples of pouching options.  Answered patient and family questions. Patient in early stages of Alzheimers and was unclear if he had known there was a possibility of creation of a stoma.  Counseled patient and his sister that is accompanying him today that the marking is to ensure that the best location possible is selected IF an ostomy is necessary. Both are in agreement and we proceed.   Examined patient lying, sitting, and standing in order to place the marking in the patient's visual field, away from any creases or abdominal contour issues and within the rectus muscle. Patient has large scar to RUQ from open cholecystectomy performed decades ago.  Ideal ostomy site located in LUQ within rectus muscle.   Marked for colostomy in the LLQ  4 cm to the left of the umbilicus and 4 cm above the umbilicus.    Patient's abdomen cleansed with CHG wipes at site markings, allowed to air dry prior to marking.Covered mark with thin film transparent dressing to preserve mark until date of surgery (tomorrow)   [facility] Nurse team will follow up with patient after surgery for continue ostomy care and teaching.   Domenic Moras RN BSN [place] Pager [phone]

## 2017-11-26 ENCOUNTER — Encounter (HOSPITAL_COMMUNITY): Payer: Self-pay | Admitting: Registered Nurse

## 2017-11-26 ENCOUNTER — Encounter (HOSPITAL_COMMUNITY)
Admission: RE | Disposition: A | Discharge status: Home / Self Care | Payer: Self-pay | Source: Ambulatory Visit | Attending: Surgery

## 2017-11-26 ENCOUNTER — Inpatient Hospital Stay (HOSPITAL_COMMUNITY): Payer: PPO | Admitting: Anesthesiology

## 2017-11-26 ENCOUNTER — Inpatient Hospital Stay (HOSPITAL_COMMUNITY)
Admission: RE | Admit: 2017-11-26 | Discharge: 2017-12-02 | DRG: 330 | Disposition: A | Discharge status: Home / Self Care | Payer: PPO | Source: Ambulatory Visit | Attending: Surgery | Admitting: Surgery

## 2017-11-26 ENCOUNTER — Other Ambulatory Visit: Payer: Self-pay

## 2017-11-26 DIAGNOSIS — K921 Melena: Secondary | ICD-10-CM | POA: Diagnosis not present

## 2017-11-26 DIAGNOSIS — K56699 Other intestinal obstruction unspecified as to partial versus complete obstruction: Secondary | ICD-10-CM | POA: Diagnosis present

## 2017-11-26 DIAGNOSIS — E1122 Type 2 diabetes mellitus with diabetic chronic kidney disease: Secondary | ICD-10-CM | POA: Diagnosis present

## 2017-11-26 DIAGNOSIS — Z85038 Personal history of other malignant neoplasm of large intestine: Secondary | ICD-10-CM | POA: Diagnosis present

## 2017-11-26 DIAGNOSIS — N183 Chronic kidney disease, stage 3 unspecified: Secondary | ICD-10-CM | POA: Diagnosis present

## 2017-11-26 DIAGNOSIS — Z7901 Long term (current) use of anticoagulants: Secondary | ICD-10-CM | POA: Diagnosis not present

## 2017-11-26 DIAGNOSIS — Z85828 Personal history of other malignant neoplasm of skin: Secondary | ICD-10-CM | POA: Diagnosis not present

## 2017-11-26 DIAGNOSIS — D125 Benign neoplasm of sigmoid colon: Secondary | ICD-10-CM | POA: Diagnosis present

## 2017-11-26 DIAGNOSIS — Z9841 Cataract extraction status, right eye: Secondary | ICD-10-CM | POA: Diagnosis not present

## 2017-11-26 DIAGNOSIS — C183 Malignant neoplasm of hepatic flexure: Secondary | ICD-10-CM | POA: Diagnosis present

## 2017-11-26 DIAGNOSIS — I10 Essential (primary) hypertension: Secondary | ICD-10-CM | POA: Diagnosis present

## 2017-11-26 DIAGNOSIS — D62 Acute posthemorrhagic anemia: Secondary | ICD-10-CM | POA: Diagnosis not present

## 2017-11-26 DIAGNOSIS — R0600 Dyspnea, unspecified: Secondary | ICD-10-CM | POA: Diagnosis not present

## 2017-11-26 DIAGNOSIS — E1140 Type 2 diabetes mellitus with diabetic neuropathy, unspecified: Secondary | ICD-10-CM | POA: Diagnosis present

## 2017-11-26 DIAGNOSIS — I4891 Unspecified atrial fibrillation: Secondary | ICD-10-CM | POA: Diagnosis present

## 2017-11-26 DIAGNOSIS — I129 Hypertensive chronic kidney disease with stage 1 through stage 4 chronic kidney disease, or unspecified chronic kidney disease: Secondary | ICD-10-CM | POA: Diagnosis present

## 2017-11-26 DIAGNOSIS — F0280 Dementia in other diseases classified elsewhere without behavioral disturbance: Secondary | ICD-10-CM | POA: Diagnosis present

## 2017-11-26 DIAGNOSIS — F0150 Vascular dementia without behavioral disturbance: Secondary | ICD-10-CM | POA: Diagnosis present

## 2017-11-26 DIAGNOSIS — K4090 Unilateral inguinal hernia, without obstruction or gangrene, not specified as recurrent: Secondary | ICD-10-CM | POA: Diagnosis present

## 2017-11-26 DIAGNOSIS — T8092XA Unspecified transfusion reaction, initial encounter: Secondary | ICD-10-CM | POA: Diagnosis not present

## 2017-11-26 DIAGNOSIS — N1831 Chronic kidney disease, stage 3a: Secondary | ICD-10-CM | POA: Diagnosis present

## 2017-11-26 DIAGNOSIS — I480 Paroxysmal atrial fibrillation: Secondary | ICD-10-CM | POA: Diagnosis present

## 2017-11-26 DIAGNOSIS — Z87891 Personal history of nicotine dependence: Secondary | ICD-10-CM | POA: Diagnosis not present

## 2017-11-26 DIAGNOSIS — Z923 Personal history of irradiation: Secondary | ICD-10-CM | POA: Diagnosis not present

## 2017-11-26 DIAGNOSIS — C184 Malignant neoplasm of transverse colon: Secondary | ICD-10-CM | POA: Diagnosis not present

## 2017-11-26 DIAGNOSIS — Y848 Other medical procedures as the cause of abnormal reaction of the patient, or of later complication, without mention of misadventure at the time of the procedure: Secondary | ICD-10-CM | POA: Diagnosis not present

## 2017-11-26 DIAGNOSIS — Z8546 Personal history of malignant neoplasm of prostate: Secondary | ICD-10-CM | POA: Diagnosis not present

## 2017-11-26 DIAGNOSIS — C19 Malignant neoplasm of rectosigmoid junction: Secondary | ICD-10-CM | POA: Diagnosis present

## 2017-11-26 DIAGNOSIS — Z9842 Cataract extraction status, left eye: Secondary | ICD-10-CM | POA: Diagnosis not present

## 2017-11-26 DIAGNOSIS — E119 Type 2 diabetes mellitus without complications: Secondary | ICD-10-CM

## 2017-11-26 DIAGNOSIS — I959 Hypotension, unspecified: Secondary | ICD-10-CM | POA: Diagnosis not present

## 2017-11-26 DIAGNOSIS — Z961 Presence of intraocular lens: Secondary | ICD-10-CM | POA: Diagnosis present

## 2017-11-26 DIAGNOSIS — K219 Gastro-esophageal reflux disease without esophagitis: Secondary | ICD-10-CM | POA: Diagnosis present

## 2017-11-26 DIAGNOSIS — G309 Alzheimer's disease, unspecified: Secondary | ICD-10-CM | POA: Diagnosis present

## 2017-11-26 DIAGNOSIS — K5730 Diverticulosis of large intestine without perforation or abscess without bleeding: Secondary | ICD-10-CM | POA: Diagnosis present

## 2017-11-26 DIAGNOSIS — Z86010 Personal history of colonic polyps: Secondary | ICD-10-CM | POA: Diagnosis not present

## 2017-11-26 HISTORY — PX: PROCTOSCOPY: SHX2266

## 2017-11-26 HISTORY — PX: XI ROBOTIC ASSISTED LOWER ANTERIOR RESECTION: SHX6558

## 2017-11-26 HISTORY — PX: COLONOSCOPY: SHX5424

## 2017-11-26 LAB — GLUCOSE, CAPILLARY
GLUCOSE-CAPILLARY: 140 mg/dL — AB (ref 65–99)
Glucose-Capillary: 189 mg/dL — ABNORMAL HIGH (ref 65–99)
Glucose-Capillary: 213 mg/dL — ABNORMAL HIGH (ref 65–99)
Glucose-Capillary: 171 mg/dL — ABNORMAL HIGH (ref 65–99)

## 2017-11-26 LAB — TYPE AND SCREEN
ABO/RH(D): O POS
Antibody Screen: NEGATIVE

## 2017-11-26 SURGERY — RESECTION, RECTUM, LOW ANTERIOR, ROBOT-ASSISTED
Anesthesia: General | Site: Abdomen

## 2017-11-26 MED ORDER — INSULIN ASPART 100 UNIT/ML ~~LOC~~ SOLN: 0.0000 [IU] | Freq: Three times a day (TID) | SUBCUTANEOUS | Status: DC

## 2017-11-26 MED ORDER — ACETAMINOPHEN 500 MG PO TABS
1000.0000 mg | ORAL_TABLET | ORAL | Status: AC
  Administered 2017-11-26: 1000 mg via ORAL
  Filled 2017-11-26: qty 2

## 2017-11-26 MED ORDER — TRAMADOL HCL 50 MG PO TABS: 50.0000 mg | ORAL_TABLET | Freq: Four times a day (QID) | ORAL | Status: DC | PRN

## 2017-11-26 MED ORDER — PROCHLORPERAZINE MALEATE 10 MG PO TABS
10.0000 mg | ORAL_TABLET | Freq: Four times a day (QID) | ORAL | Status: DC | PRN
  Filled 2017-11-26: qty 1

## 2017-11-26 MED ORDER — HYDROMORPHONE HCL 1 MG/ML IJ SOLN
0.5000 mg | INTRAMUSCULAR | Status: DC | PRN
  Administered 2017-11-29: 1 mg via INTRAVENOUS
  Filled 2017-11-26 (×2): qty 1

## 2017-11-26 MED ORDER — ALBUMIN HUMAN 5 % IV SOLN
INTRAVENOUS | Status: AC
  Administered 2017-11-26: 12.5 g
  Filled 2017-11-26: qty 250

## 2017-11-26 MED ORDER — LACTATED RINGERS IV SOLN
INTRAVENOUS | Status: DC
  Administered 2017-11-26 (×3): via INTRAVENOUS

## 2017-11-26 MED ORDER — DEXAMETHASONE SODIUM PHOSPHATE 10 MG/ML IJ SOLN
INTRAMUSCULAR | Status: AC
  Filled 2017-11-26: qty 1

## 2017-11-26 MED ORDER — PHENYLEPHRINE 40 MCG/ML (10ML) SYRINGE FOR IV PUSH (FOR BLOOD PRESSURE SUPPORT)
PREFILLED_SYRINGE | INTRAVENOUS | Status: DC | PRN
  Administered 2017-11-26: 120 ug via INTRAVENOUS
  Administered 2017-11-26: 80 ug via INTRAVENOUS
  Administered 2017-11-26: 40 ug via INTRAVENOUS
  Administered 2017-11-26 (×2): 80 ug via INTRAVENOUS
  Administered 2017-11-26: 120 ug via INTRAVENOUS

## 2017-11-26 MED ORDER — LIDOCAINE 2% (20 MG/ML) 5 ML SYRINGE
INTRAMUSCULAR | Status: DC | PRN
  Administered 2017-11-26: 20 mg via INTRAVENOUS

## 2017-11-26 MED ORDER — LIDOCAINE HCL 2 % IJ SOLN
INTRAMUSCULAR | Status: AC
  Filled 2017-11-26: qty 20

## 2017-11-26 MED ORDER — BUPIVACAINE LIPOSOME 1.3 % IJ SUSP
INTRAMUSCULAR | Status: DC | PRN
  Administered 2017-11-26: 20 mL

## 2017-11-26 MED ORDER — SODIUM CHLORIDE 0.9 % IV SOLN
2.0000 g | Freq: Two times a day (BID) | INTRAVENOUS | Status: AC
  Administered 2017-11-26: 2 g via INTRAVENOUS
  Filled 2017-11-26: qty 2

## 2017-11-26 MED ORDER — DIPHENHYDRAMINE HCL 12.5 MG/5ML PO ELIX
12.5000 mg | ORAL_SOLUTION | Freq: Four times a day (QID) | ORAL | Status: DC | PRN
  Administered 2017-11-29: 12.5 mg via ORAL
  Filled 2017-11-26: qty 5

## 2017-11-26 MED ORDER — PROPOFOL 10 MG/ML IV BOLUS
INTRAVENOUS | Status: AC
  Filled 2017-11-26: qty 20

## 2017-11-26 MED ORDER — SACCHAROMYCES BOULARDII 250 MG PO CAPS
250.0000 mg | ORAL_CAPSULE | Freq: Two times a day (BID) | ORAL | Status: DC
  Administered 2017-11-26 – 2017-12-02 (×12): 250 mg via ORAL
  Filled 2017-11-26 (×12): qty 1

## 2017-11-26 MED ORDER — METHOCARBAMOL 500 MG PO TABS: 1000.0000 mg | ORAL_TABLET | Freq: Four times a day (QID) | ORAL | Status: DC | PRN

## 2017-11-26 MED ORDER — SODIUM CHLORIDE 0.9 % IV SOLN
INTRAVENOUS | Status: DC
  Filled 2017-11-26: qty 6

## 2017-11-26 MED ORDER — SODIUM CHLORIDE 0.9 % IV SOLN
INTRAVENOUS | Status: DC | PRN
  Administered 2017-11-26: 1000 mL via INTRAPERITONEAL

## 2017-11-26 MED ORDER — ALBUMIN HUMAN 5 % IV SOLN
INTRAVENOUS | Status: AC
  Filled 2017-11-26: qty 500

## 2017-11-26 MED ORDER — KETAMINE HCL 10 MG/ML IJ SOLN
INTRAMUSCULAR | Status: DC | PRN
  Administered 2017-11-26: 25 mg via INTRAVENOUS

## 2017-11-26 MED ORDER — HYDRALAZINE HCL 20 MG/ML IJ SOLN
10.0000 mg | INTRAMUSCULAR | Status: DC | PRN
Start: 2017-11-26 — End: 2017-12-01
  Filled 2017-11-26: qty 1

## 2017-11-26 MED ORDER — FENTANYL CITRATE (PF) 100 MCG/2ML IJ SOLN: 25.0000 ug | INTRAMUSCULAR | Status: DC | PRN

## 2017-11-26 MED ORDER — ROCURONIUM BROMIDE 10 MG/ML (PF) SYRINGE
PREFILLED_SYRINGE | INTRAVENOUS | Status: AC
  Filled 2017-11-26: qty 5

## 2017-11-26 MED ORDER — INSULIN ASPART 100 UNIT/ML ~~LOC~~ SOLN: 0.0000 [IU] | Freq: Every day | SUBCUTANEOUS | Status: DC

## 2017-11-26 MED ORDER — GABAPENTIN 300 MG PO CAPS
300.0000 mg | ORAL_CAPSULE | Freq: Two times a day (BID) | ORAL | Status: DC
  Administered 2017-11-27 – 2017-12-02 (×11): 300 mg via ORAL
  Filled 2017-11-26 (×11): qty 1

## 2017-11-26 MED ORDER — PHENYLEPHRINE HCL 10 MG/ML IJ SOLN
INTRAMUSCULAR | Status: AC
  Filled 2017-11-26: qty 1

## 2017-11-26 MED ORDER — MENTHOL 3 MG MT LOZG: 1.0000 | LOZENGE | OROMUCOSAL | Status: DC | PRN

## 2017-11-26 MED ORDER — ACETAMINOPHEN 500 MG PO TABS
1000.0000 mg | ORAL_TABLET | Freq: Four times a day (QID) | ORAL | Status: DC
  Administered 2017-11-26 – 2017-11-27 (×5): 1000 mg via ORAL
  Administered 2017-11-28: 500 mg via ORAL
  Administered 2017-11-28 – 2017-12-02 (×16): 1000 mg via ORAL
  Filled 2017-11-26 (×24): qty 2

## 2017-11-26 MED ORDER — EPHEDRINE SULFATE-NACL 50-0.9 MG/10ML-% IV SOSY
PREFILLED_SYRINGE | INTRAVENOUS | Status: DC | PRN
  Administered 2017-11-26: 10 mg via INTRAVENOUS

## 2017-11-26 MED ORDER — DEXAMETHASONE SODIUM PHOSPHATE 10 MG/ML IJ SOLN
INTRAMUSCULAR | Status: DC | PRN
  Administered 2017-11-26: 8 mg via INTRAVENOUS

## 2017-11-26 MED ORDER — 0.9 % SODIUM CHLORIDE (POUR BTL) OPTIME
TOPICAL | Status: DC | PRN
  Administered 2017-11-26: 2000 mL

## 2017-11-26 MED ORDER — INSULIN ASPART 100 UNIT/ML ~~LOC~~ SOLN
0.0000 [IU] | Freq: Three times a day (TID) | SUBCUTANEOUS | Status: DC
  Administered 2017-11-26: 5 [IU] via SUBCUTANEOUS
  Administered 2017-11-27: 3 [IU] via SUBCUTANEOUS
  Administered 2017-11-28 (×2): 2 [IU] via SUBCUTANEOUS
  Administered 2017-11-29 – 2017-11-30 (×2): 3 [IU] via SUBCUTANEOUS
  Administered 2017-11-30: 2 [IU] via SUBCUTANEOUS
  Administered 2017-11-30: 0 [IU] via SUBCUTANEOUS
  Administered 2017-12-01 – 2017-12-02 (×3): 2 [IU] via SUBCUTANEOUS

## 2017-11-26 MED ORDER — LIP MEDEX EX OINT
1.0000 "application " | TOPICAL_OINTMENT | Freq: Two times a day (BID) | CUTANEOUS | Status: DC
  Administered 2017-11-26 – 2017-12-02 (×8): 1 via TOPICAL
  Filled 2017-11-26 (×4): qty 7

## 2017-11-26 MED ORDER — ONDANSETRON HCL 4 MG/2ML IJ SOLN: 4.0000 mg | Freq: Once | INTRAMUSCULAR | Status: DC | PRN

## 2017-11-26 MED ORDER — ENSURE SURGERY PO LIQD
237.0000 mL | Freq: Two times a day (BID) | ORAL | Status: DC
  Administered 2017-11-27 – 2017-12-02 (×3): 237 mL via ORAL
  Filled 2017-11-26 (×12): qty 237

## 2017-11-26 MED ORDER — LIDOCAINE 2% (20 MG/ML) 5 ML SYRINGE
INTRAMUSCULAR | Status: DC | PRN
  Administered 2017-11-26: 1.5 mg/kg/h via INTRAVENOUS

## 2017-11-26 MED ORDER — TRAMADOL HCL 50 MG PO TABS: 50.0000 mg | ORAL_TABLET | Freq: Four times a day (QID) | ORAL | 0 refills | Status: DC | PRN

## 2017-11-26 MED ORDER — ONDANSETRON HCL 4 MG/2ML IJ SOLN
INTRAMUSCULAR | Status: AC
  Filled 2017-11-26: qty 2

## 2017-11-26 MED ORDER — EPHEDRINE 5 MG/ML INJ
INTRAVENOUS | Status: AC
  Filled 2017-11-26: qty 10

## 2017-11-26 MED ORDER — GUAIFENESIN-DM 100-10 MG/5ML PO SYRP: 10.0000 mL | ORAL_SOLUTION | ORAL | Status: DC | PRN

## 2017-11-26 MED ORDER — ALVIMOPAN 12 MG PO CAPS
12.0000 mg | ORAL_CAPSULE | ORAL | Status: AC
  Administered 2017-11-26: 12 mg via ORAL
  Filled 2017-11-26: qty 1

## 2017-11-26 MED ORDER — BUPIVACAINE-EPINEPHRINE 0.5% -1:200000 IJ SOLN
INTRAMUSCULAR | Status: DC | PRN
  Administered 2017-11-26: 30 mL

## 2017-11-26 MED ORDER — HYDROCORTISONE 1 % EX CREA: 1.0000 "application " | TOPICAL_CREAM | Freq: Three times a day (TID) | CUTANEOUS | Status: DC | PRN

## 2017-11-26 MED ORDER — KETAMINE HCL 10 MG/ML IJ SOLN
INTRAMUSCULAR | Status: AC
  Filled 2017-11-26: qty 1

## 2017-11-26 MED ORDER — LACTATED RINGERS IV SOLN: INTRAVENOUS | Status: AC

## 2017-11-26 MED ORDER — PHENYLEPHRINE 40 MCG/ML (10ML) SYRINGE FOR IV PUSH (FOR BLOOD PRESSURE SUPPORT)
PREFILLED_SYRINGE | INTRAVENOUS | Status: AC
  Filled 2017-11-26: qty 10

## 2017-11-26 MED ORDER — LACTATED RINGERS IR SOLN
Status: DC | PRN
  Administered 2017-11-26: 1000 mL

## 2017-11-26 MED ORDER — FENTANYL CITRATE (PF) 250 MCG/5ML IJ SOLN
INTRAMUSCULAR | Status: AC
  Filled 2017-11-26: qty 5

## 2017-11-26 MED ORDER — SUGAMMADEX SODIUM 200 MG/2ML IV SOLN
INTRAVENOUS | Status: DC | PRN
  Administered 2017-11-26: 200 mg via INTRAVENOUS

## 2017-11-26 MED ORDER — ALBUMIN HUMAN 5 % IV SOLN
12.5000 g | Freq: Once | INTRAVENOUS | Status: AC
  Administered 2017-11-26: 12.5 g via INTRAVENOUS

## 2017-11-26 MED ORDER — METHOCARBAMOL 1000 MG/10ML IJ SOLN
1000.0000 mg | Freq: Four times a day (QID) | INTRAVENOUS | Status: DC | PRN
  Filled 2017-11-26: qty 10

## 2017-11-26 MED ORDER — PROCHLORPERAZINE EDISYLATE 10 MG/2ML IJ SOLN: 5.0000 mg | Freq: Four times a day (QID) | INTRAMUSCULAR | Status: DC | PRN

## 2017-11-26 MED ORDER — SUGAMMADEX SODIUM 200 MG/2ML IV SOLN
INTRAVENOUS | Status: AC
  Filled 2017-11-26: qty 2

## 2017-11-26 MED ORDER — HYDROCORTISONE 2.5 % RE CREA: 1.0000 "application " | TOPICAL_CREAM | Freq: Four times a day (QID) | RECTAL | Status: DC | PRN

## 2017-11-26 MED ORDER — METOCLOPRAMIDE HCL 5 MG/ML IJ SOLN
10.0000 mg | Freq: Four times a day (QID) | INTRAMUSCULAR | Status: DC | PRN
  Administered 2017-11-30: 10 mg via INTRAVENOUS
  Filled 2017-11-26: qty 2

## 2017-11-26 MED ORDER — ONDANSETRON HCL 4 MG PO TABS
4.0000 mg | ORAL_TABLET | Freq: Four times a day (QID) | ORAL | Status: DC | PRN
Start: 2017-11-26 — End: 2017-12-02

## 2017-11-26 MED ORDER — INDOCYANINE GREEN 25 MG IV SOLR
INTRAVENOUS | Status: DC | PRN
  Administered 2017-11-26 (×2): 7.5 mg via INTRAVENOUS

## 2017-11-26 MED ORDER — LACTATED RINGERS IV BOLUS
1000.0000 mL | Freq: Three times a day (TID) | INTRAVENOUS | Status: AC | PRN
  Administered 2017-11-26 – 2017-11-27 (×2): 1000 mL via INTRAVENOUS

## 2017-11-26 MED ORDER — LIDOCAINE 2% (20 MG/ML) 5 ML SYRINGE
INTRAMUSCULAR | Status: AC
  Filled 2017-11-26: qty 5

## 2017-11-26 MED ORDER — DIPHENHYDRAMINE HCL 50 MG/ML IJ SOLN: 12.5000 mg | Freq: Four times a day (QID) | INTRAMUSCULAR | Status: DC | PRN

## 2017-11-26 MED ORDER — ALVIMOPAN 12 MG PO CAPS
12.0000 mg | ORAL_CAPSULE | Freq: Two times a day (BID) | ORAL | Status: DC
  Administered 2017-11-27 (×2): 12 mg via ORAL
  Filled 2017-11-26 (×3): qty 1

## 2017-11-26 MED ORDER — GABAPENTIN 300 MG PO CAPS
300.0000 mg | ORAL_CAPSULE | ORAL | Status: AC
  Administered 2017-11-26: 300 mg via ORAL
  Filled 2017-11-26: qty 1

## 2017-11-26 MED ORDER — ENOXAPARIN SODIUM 40 MG/0.4ML ~~LOC~~ SOLN: 40.0000 mg | SUBCUTANEOUS | Status: DC

## 2017-11-26 MED ORDER — LATANOPROST 0.005 % OP SOLN
1.0000 [drp] | Freq: Every day | OPHTHALMIC | Status: DC
  Administered 2017-11-26 – 2017-12-01 (×6): 1 [drp] via OPHTHALMIC
  Filled 2017-11-26 (×3): qty 2.5

## 2017-11-26 MED ORDER — BUPIVACAINE-EPINEPHRINE (PF) 0.5% -1:200000 IJ SOLN
INTRAMUSCULAR | Status: AC
  Filled 2017-11-26: qty 30

## 2017-11-26 MED ORDER — ENOXAPARIN SODIUM 40 MG/0.4ML ~~LOC~~ SOLN
40.0000 mg | Freq: Once | SUBCUTANEOUS | Status: AC
  Administered 2017-11-26: 40 mg via SUBCUTANEOUS
  Filled 2017-11-26: qty 0.4

## 2017-11-26 MED ORDER — BOOST / RESOURCE BREEZE PO LIQD CUSTOM
1.0000 | Freq: Three times a day (TID) | ORAL | Status: DC
  Administered 2017-11-26: 1 via ORAL

## 2017-11-26 MED ORDER — SODIUM CHLORIDE 0.9 % IJ SOLN
INTRAMUSCULAR | Status: AC
  Filled 2017-11-26: qty 20

## 2017-11-26 MED ORDER — ONDANSETRON HCL 4 MG/2ML IJ SOLN: 4.0000 mg | Freq: Four times a day (QID) | INTRAMUSCULAR | Status: DC | PRN

## 2017-11-26 MED ORDER — METOPROLOL TARTRATE 5 MG/5ML IV SOLN
5.0000 mg | Freq: Four times a day (QID) | INTRAVENOUS | Status: DC | PRN
  Administered 2017-11-29: 5 mg via INTRAVENOUS
  Filled 2017-11-26: qty 5

## 2017-11-26 MED ORDER — PROPOFOL 10 MG/ML IV BOLUS
INTRAVENOUS | Status: DC | PRN
  Administered 2017-11-26: 130 mg via INTRAVENOUS

## 2017-11-26 MED ORDER — ROCURONIUM BROMIDE 10 MG/ML (PF) SYRINGE
PREFILLED_SYRINGE | INTRAVENOUS | Status: DC | PRN
  Administered 2017-11-26: 50 mg via INTRAVENOUS
  Administered 2017-11-26 (×3): 20 mg via INTRAVENOUS
  Administered 2017-11-26: 30 mg via INTRAVENOUS

## 2017-11-26 MED ORDER — ALBUMIN HUMAN 5 % IV SOLN
INTRAVENOUS | Status: DC | PRN
  Administered 2017-11-26 (×2): via INTRAVENOUS

## 2017-11-26 MED ORDER — CEFOTETAN DISODIUM-DEXTROSE 2-2.08 GM-%(50ML) IV SOLR
2.0000 g | INTRAVENOUS | Status: AC
  Administered 2017-11-26: 2 g via INTRAVENOUS
  Filled 2017-11-26: qty 50

## 2017-11-26 MED ORDER — MAGIC MOUTHWASH
15.0000 mL | Freq: Four times a day (QID) | ORAL | Status: DC | PRN
  Filled 2017-11-26: qty 15

## 2017-11-26 MED ORDER — OXYCODONE HCL 5 MG/5ML PO SOLN
5.0000 mg | Freq: Once | ORAL | Status: DC | PRN
  Filled 2017-11-26: qty 5

## 2017-11-26 MED ORDER — FENTANYL CITRATE (PF) 250 MCG/5ML IJ SOLN
INTRAMUSCULAR | Status: DC | PRN
  Administered 2017-11-26 (×3): 50 ug via INTRAVENOUS

## 2017-11-26 MED ORDER — PHENYLEPHRINE HCL 10 MG/ML IJ SOLN
INTRAVENOUS | Status: DC | PRN
  Administered 2017-11-26: 80 ug/min via INTRAVENOUS

## 2017-11-26 MED ORDER — ALUM & MAG HYDROXIDE-SIMETH 200-200-20 MG/5ML PO SUSP: 30.0000 mL | Freq: Four times a day (QID) | ORAL | Status: DC | PRN

## 2017-11-26 MED ORDER — ENSURE ENLIVE PO LIQD: 237.0000 mL | Freq: Two times a day (BID) | ORAL | Status: DC

## 2017-11-26 MED ORDER — PHENOL 1.4 % MT LIQD: 1.0000 | OROMUCOSAL | Status: DC | PRN

## 2017-11-26 MED ORDER — OXYCODONE HCL 5 MG PO TABS: 5.0000 mg | ORAL_TABLET | Freq: Once | ORAL | Status: DC | PRN

## 2017-11-26 SURGICAL SUPPLY — 108 items
APPLIER CLIP 5 13 M/L LIGAMAX5 (MISCELLANEOUS)
APPLIER CLIP ROT 10 11.4 M/L (STAPLE)
APR CLP MED LRG 11.4X10 (STAPLE)
APR CLP MED LRG 5 ANG JAW (MISCELLANEOUS)
BLADE EXTENDED COATED 6.5IN (ELECTRODE) IMPLANT
CANNULA REDUC XI 12-8 STAPL (CANNULA) ×1
CANNULA REDUC XI 12-8MM STAPL (CANNULA) ×1
CANNULA REDUCER 12-8 DVNC XI (CANNULA) ×2 IMPLANT
CATH MUSHROOM 30FR (CATHETERS) ×2 IMPLANT
CELLS DAT CNTRL 66122 CELL SVR (MISCELLANEOUS) IMPLANT
CHLORAPREP W/TINT 26ML (MISCELLANEOUS) ×2 IMPLANT
CLIP APPLIE 5 13 M/L LIGAMAX5 (MISCELLANEOUS) IMPLANT
CLIP APPLIE ROT 10 11.4 M/L (STAPLE) IMPLANT
CLIP VESOLOCK LG 6/CT PURPLE (CLIP) IMPLANT
CLIP VESOLOCK MED LG 6/CT (CLIP) IMPLANT
COUNTER NEEDLE 20 DBL MAG RED (NEEDLE) ×4 IMPLANT
COVER MAYO STAND STRL (DRAPES) ×8 IMPLANT
COVER SURGICAL LIGHT HANDLE (MISCELLANEOUS) ×4 IMPLANT
COVER TIP SHEARS 8 DVNC (MISCELLANEOUS) ×2 IMPLANT
COVER TIP SHEARS 8MM DA VINCI (MISCELLANEOUS) ×2
DECANTER SPIKE VIAL GLASS SM (MISCELLANEOUS) ×4 IMPLANT
DEVICE TROCAR PUNCTURE CLOSURE (ENDOMECHANICALS) IMPLANT
DRAIN CHANNEL 19F RND (DRAIN) IMPLANT
DRAPE ARM DVNC X/XI (DISPOSABLE) ×8 IMPLANT
DRAPE COLUMN DVNC XI (DISPOSABLE) ×2 IMPLANT
DRAPE DA VINCI XI ARM (DISPOSABLE) ×8
DRAPE DA VINCI XI COLUMN (DISPOSABLE) ×2
DRAPE SURG IRRIG POUCH 19X23 (DRAPES) ×4 IMPLANT
DRSG OPSITE POSTOP 3X4 (GAUZE/BANDAGES/DRESSINGS) ×2 IMPLANT
DRSG OPSITE POSTOP 4X10 (GAUZE/BANDAGES/DRESSINGS) IMPLANT
DRSG OPSITE POSTOP 4X6 (GAUZE/BANDAGES/DRESSINGS) IMPLANT
DRSG OPSITE POSTOP 4X8 (GAUZE/BANDAGES/DRESSINGS) IMPLANT
DRSG TEGADERM 2-3/8X2-3/4 SM (GAUZE/BANDAGES/DRESSINGS) ×10 IMPLANT
DRSG TEGADERM 4X4.75 (GAUZE/BANDAGES/DRESSINGS) ×2 IMPLANT
ELECT PENCIL ROCKER SW 15FT (MISCELLANEOUS) ×8 IMPLANT
ELECT REM PT RETURN 15FT ADLT (MISCELLANEOUS) ×4 IMPLANT
EVACUATOR SILICONE 100CC (DRAIN) IMPLANT
GAUZE SPONGE 2X2 8PLY STRL LF (GAUZE/BANDAGES/DRESSINGS) ×2 IMPLANT
GAUZE SPONGE 4X4 12PLY STRL (GAUZE/BANDAGES/DRESSINGS) IMPLANT
GLOVE ECLIPSE 8.0 STRL XLNG CF (GLOVE) ×12 IMPLANT
GLOVE INDICATOR 8.0 STRL GRN (GLOVE) ×12 IMPLANT
GOWN STRL REUS W/TWL XL LVL3 (GOWN DISPOSABLE) ×20 IMPLANT
GRASPER ENDOPATH ANVIL 10MM (MISCELLANEOUS) IMPLANT
HOLDER FOLEY CATH W/STRAP (MISCELLANEOUS) ×4 IMPLANT
IRRIG SUCT STRYKERFLOW 2 WTIP (MISCELLANEOUS) ×4
IRRIGATION SUCT STRKRFLW 2 WTP (MISCELLANEOUS) ×2 IMPLANT
KIT PROCEDURE DA VINCI SI (MISCELLANEOUS) ×2
KIT PROCEDURE DVNC SI (MISCELLANEOUS) ×2 IMPLANT
LEGGING LITHOTOMY PAIR STRL (DRAPES) ×4 IMPLANT
LUBRICANT JELLY K Y 4OZ (MISCELLANEOUS) ×4 IMPLANT
NDL INSUFFLATION 14GA 120MM (NEEDLE) ×2 IMPLANT
NEEDLE INSUFFLATION 14GA 120MM (NEEDLE) ×4 IMPLANT
PACK CARDIOVASCULAR III (CUSTOM PROCEDURE TRAY) ×4 IMPLANT
PACK COLON (CUSTOM PROCEDURE TRAY) ×4 IMPLANT
PAD POSITIONING PINK XL (MISCELLANEOUS) ×4 IMPLANT
PORT LAP GEL ALEXIS MED 5-9CM (MISCELLANEOUS) ×4 IMPLANT
RELOAD STAPLE 45 BLU REG DVNC (STAPLE) IMPLANT
RELOAD STAPLE 45 GRN THCK DVNC (STAPLE) IMPLANT
RETRACTOR WND ALEXIS 18 MED (MISCELLANEOUS) IMPLANT
RTRCTR WOUND ALEXIS 18CM MED (MISCELLANEOUS)
SCISSORS LAP 5X35 DISP (ENDOMECHANICALS) ×4 IMPLANT
SEAL CANN UNIV 5-8 DVNC XI (MISCELLANEOUS) ×6 IMPLANT
SEAL XI 5MM-8MM UNIVERSAL (MISCELLANEOUS) ×8
SEALER VESSEL DA VINCI XI (MISCELLANEOUS) ×2
SEALER VESSEL EXT DVNC XI (MISCELLANEOUS) ×2 IMPLANT
SLEEVE ADV FIXATION 5X100MM (TROCAR) ×6 IMPLANT
SOLUTION ELECTROLUBE (MISCELLANEOUS) ×4 IMPLANT
SPONGE GAUZE 2X2 STER 10/PKG (GAUZE/BANDAGES/DRESSINGS) ×2
STAPLER 45 BLU RELOAD XI (STAPLE) ×8 IMPLANT
STAPLER 45 BLUE RELOAD XI (STAPLE) ×8
STAPLER 45 GREEN RELOAD XI (STAPLE) ×6
STAPLER 45 GRN RELOAD XI (STAPLE) ×6 IMPLANT
STAPLER AUT SUT 4.8 EEAXL 31 (STAPLE) ×2 IMPLANT
STAPLER CANNULA SEAL DVNC XI (STAPLE) ×2 IMPLANT
STAPLER CANNULA SEAL XI (STAPLE) ×2
STAPLER SHEATH (SHEATH) ×2
STAPLER SHEATH ENDOWRIST DVNC (SHEATH) ×2 IMPLANT
STAPLER VISISTAT 35W (STAPLE) ×2 IMPLANT
SUT MNCRL AB 4-0 PS2 18 (SUTURE) ×4 IMPLANT
SUT PDS AB 1 CTX 36 (SUTURE) ×8 IMPLANT
SUT PDS AB 1 TP1 96 (SUTURE) IMPLANT
SUT PDS AB 2-0 CT2 27 (SUTURE) IMPLANT
SUT PROLENE 0 CT 2 (SUTURE) ×4 IMPLANT
SUT PROLENE 2 0 KS (SUTURE) ×4 IMPLANT
SUT PROLENE 2 0 SH DA (SUTURE) IMPLANT
SUT SILK 2 0 (SUTURE) ×4
SUT SILK 2 0 SH CR/8 (SUTURE) ×4 IMPLANT
SUT SILK 2-0 18XBRD TIE 12 (SUTURE) ×2 IMPLANT
SUT SILK 3 0 (SUTURE) ×4
SUT SILK 3 0 SH CR/8 (SUTURE) ×4 IMPLANT
SUT SILK 3-0 18XBRD TIE 12 (SUTURE) ×2 IMPLANT
SUT V-LOC BARB 180 2/0GR6 GS22 (SUTURE) ×8
SUT VIC AB 3-0 SH 18 (SUTURE) IMPLANT
SUT VIC AB 3-0 SH 27 (SUTURE)
SUT VIC AB 3-0 SH 27XBRD (SUTURE) IMPLANT
SUT VICRYL 0 UR6 27IN ABS (SUTURE) ×4 IMPLANT
SUTURE V-LC BRB 180 2/0GR6GS22 (SUTURE) IMPLANT
SYR 10ML LL (SYRINGE) ×4 IMPLANT
SYS LAPSCP GELPORT 120MM (MISCELLANEOUS)
SYSTEM LAPSCP GELPORT 120MM (MISCELLANEOUS) IMPLANT
TAPE UMBILICAL COTTON 1/8X30 (MISCELLANEOUS) ×4 IMPLANT
TOWEL OR 17X26 10 PK STRL BLUE (TOWEL DISPOSABLE) IMPLANT
TOWEL OR NON WOVEN STRL DISP B (DISPOSABLE) ×4 IMPLANT
TRAY FOLEY MTR SLVR 16FR STAT (SET/KITS/TRAYS/PACK) ×2 IMPLANT
TROCAR ADV FIXATION 5X100MM (TROCAR) IMPLANT
TUBING CONNECTING 10 (TUBING) ×4 IMPLANT
TUBING CONNECTING 10' (TUBING) ×4
TUBING INSUFFLATION 10FT LAP (TUBING) ×4 IMPLANT

## 2017-11-26 NOTE — Progress Notes (Signed)
Assumed care of patient from Leslie Sharpe, RN 

## 2017-11-26 NOTE — Transfer of Care (Signed)
Immediate Anesthesia Transfer of Care Note  Patient: Calvin Burnett  Procedure(s) Performed: XI ROBOTIC LYSIS OF ADHESIONS, RIGHT COLECTOMY, SIGMOIDECTOMY,  ERAS PATHWAY (N/A Abdomen) RIGID PROCTOSCOPY (N/A ) COLONOSCOPY (N/A )  Patient Location: PACU  Anesthesia Type:General  Level of Consciousness: sedated  Airway & Oxygen Therapy: Patient Spontanous Breathing and Patient connected to face mask oxygen  Post-op Assessment: Report given to RN and Post -op Vital signs reviewed and stable  Post vital signs: Reviewed and stable  Last Vitals:  Vitals Value Taken Time  BP 136/84 11/26/2017  2:10 PM  Temp    Pulse 67 11/26/2017  2:11 PM  Resp 15 11/26/2017  2:11 PM  SpO2 100 % 11/26/2017  2:11 PM  Vitals shown include unvalidated device data.  Last Pain:  Vitals:   11/26/17 0648  TempSrc: Oral      Patients Stated Pain Goal: 4 (59/97/74 1423)  Complications: No apparent anesthesia complications

## 2017-11-26 NOTE — Anesthesia Procedure Notes (Signed)
Procedure Name: Intubation Date/Time: 11/26/2017 8:33 AM Performed by: Talbot Grumbling, CRNA Pre-anesthesia Checklist: Patient identified, Emergency Drugs available, Suction available and Patient being monitored Patient Re-evaluated:Patient Re-evaluated prior to induction Oxygen Delivery Method: Circle system utilized Preoxygenation: Pre-oxygenation with 100% oxygen Induction Type: IV induction Ventilation: Mask ventilation without difficulty Laryngoscope Size: Mac and 3 Grade View: Grade I Tube type: Oral Tube size: 8.0 mm Number of attempts: 1 Airway Equipment and Method: Stylet Placement Confirmation: ETT inserted through vocal cords under direct vision,  positive ETCO2 and breath sounds checked- equal and bilateral Secured at: 22 cm Tube secured with: Tape Dental Injury: Teeth and Oropharynx as per pre-operative assessment

## 2017-11-26 NOTE — Op Note (Signed)
11/26/2017  5:35 PM  PATIENT:  Calvin Burnett  84 y.o. male  Patient Care Team: Marin Olp, MD as PCP - General (Family Medicine) Michael Boston, MD as Consulting Physician (General Surgery) Ladene Artist, MD as Consulting Physician (Gastroenterology) Stanford Breed Denice Bors, MD as Consulting Physician (Cardiology)  PRE-OPERATIVE DIAGNOSIS:  SIGMOID STRICTURE, POLYP OF HEPATIC FLEXURE OF COLON  POST-OPERATIVE DIAGNOSIS:  SIGMOID STRICTURE, POLYP OF HEPATIC FLEXURE OF COLON  PROCEDURE:  Colonoscopy  SURGEON:  Sharon Mt. Dema Severin, MD  ASSISTANT: Michael Boston, MD  ANESTHESIA:   general  COUNTS: YES  EBL: 0cc for my portion of the procedure  DRAINS: none  SPECIMEN: none  COMPLICATIONS: none  FINDINGS: Intraoperative colonoscopy performed at the request of Dr. Johney Maine for confirmation of lesion location and location of tattoo in relation to lesion as it would dictate right vs left colectomy. Sigmoid narrowing but traversable with adult colonoscope. Diverticulosis. Tattoo seen in the proximal transverse colon with marking distal to a polypoid lesion. Proximal extent of the scope was the lesion in the right colon as the ascending and cecum were planned to be included in the resection. The quality of the prep was poor, precluding adequate visualization of all colonic mucosa.  DISPOSITION: PACU in satisfactory condition  INDICATION: Intraoperative colonoscopy performed for confirmation of location of lesion in relation to tattoo and colonic blood supply.  DESCRIPTION: The patient was already positioned in lithotomy and under general anesthesia. Dr. Johney Maine had clamped the colon proximal to the potential lesion to prevent insufflation of the small bowel. The adult colonoscope was then introduced into the anal canal and under direct visualization was advanced to the location of the mass/lesion in the proximal transverse colon.  Narrowing was noted in the sigmoid but traversable with the  adult colonoscope.  No masses identified as level.  Diverticulosis was identified in the sigmoid colon.  The tattoo was identified in the transverse colon, proximally, and located just distal to the location of the endoscopically unresectable polyp. There was copious amounts of green liquid stool within the lumen of the colon which was partially evacuated with suction, however, due to the amount of particulate debris within this was not fully removable.  This precluded adequate visualization of all colonic mucosa.  The scope was removed under direct vision and the colon was decompressed on the way out.  At this point, the case was turned back over to Dr. Johney Maine.  Note: This dictation was prepared with Dragon/digital dictation along with [employer]. Any transcriptional errors that result from this process are unintentional.

## 2017-11-26 NOTE — Anesthesia Preprocedure Evaluation (Signed)
Anesthesia Evaluation  Patient identified by MRN, date of birth, ID band Patient awake    Reviewed: Allergy & Precautions, NPO status , Patient's Chart, lab work & pertinent test results  Airway Mallampati: II  TM Distance: >3 FB Neck ROM: Full    Dental  (+) Teeth Intact, Dental Advisory Given   Pulmonary former smoker,    breath sounds clear to auscultation       Cardiovascular hypertension,  Rhythm:Irregular Rate:Normal     Neuro/Psych    GI/Hepatic   Endo/Other  diabetes  Renal/GU      Musculoskeletal   Abdominal   Peds  Hematology   Anesthesia Other Findings   Reproductive/Obstetrics                             Anesthesia Physical Anesthesia Plan  ASA: III  Anesthesia Plan: General   Post-op Pain Management:    Induction: Intravenous  PONV Risk Score and Plan: 1 and Ondansetron  Airway Management Planned: Oral ETT  Additional Equipment:   Intra-op Plan:   Post-operative Plan: Extubation in OR  Informed Consent: I have reviewed the patients History and Physical, chart, labs and discussed the procedure including the risks, benefits and alternatives for the proposed anesthesia with the patient or authorized representative who has indicated his/her understanding and acceptance.   Dental advisory given  Plan Discussed with: CRNA and Anesthesiologist  Anesthesia Plan Comments:         Anesthesia Quick Evaluation

## 2017-11-26 NOTE — Op Note (Addendum)
11/26/2017  2:36 PM  PATIENT:  Calvin Burnett  84 y.o. male  Patient Care Team: Marin Olp, MD as PCP - General (Family Medicine) Michael Boston, MD as Consulting Physician (General Surgery) Ladene Artist, MD as Consulting Physician (Gastroenterology) Stanford Breed Denice Bors, MD as Consulting Physician (Cardiology)  PRE-OPERATIVE DIAGNOSIS:  SIGMOID STRICTURE, POLYP OF LEFT COLON  POST-OPERATIVE DIAGNOSIS:    SIGMOID STRICTURE POLYP OF HEPATIC FLEXURE OF COLON  PROCEDURE:   XI ROBOTIC LYSIS OF ADHESIONS RIGHT COLECTOMY SIGMOIDECTOMY Assessment of tissue perfusion with intravenous immunofluorescent Firefly RIGID PROCTOSCOPY COLONOSCOPY  SURGEON:  [name], MD  ASSISTANTS: Nadeen Landau, MD Carlena Hurl, PA-C  ANESTHESIA:   local and general  EBL:  Total I/O In: 2665 [I.V.:2115; IV Piggyback:550] Out: 58 [Urine:102; Blood:250]  Delay start of Pharmacological VTE agent (>24hrs) due to surgical blood loss or risk of bleeding:  no  DRAINS: none   SPECIMEN:   1.  PROXIMAL "RIGHT" COLON with tattooing 2.  Sigmoid stricture 3.  Distal anastomotic ring (proximal rectum)  DISPOSITION OF SPECIMEN:  PATHOLOGY  COUNTS:  YES  PLAN OF CARE: Admit to inpatient   PATIENT DISPOSITION:  PACU - hemodynamically stable.  INDICATION:    Elderly male with evidence of a sigmoid mass.  Underwent endoscopy.  Thickened narrowed area.  Polyps noted as well.  Wanting only partially receptacle in the distal transverse colon.  I recommended segmental resection:  The anatomy & physiology of the digestive tract was discussed.  The pathophysiology was discussed.  Natural history risks without surgery was discussed.   I worked to give an overview of the disease and the frequent need to have multispecialty involvement.  I feel the risks of no intervention will lead to serious problems that outweigh the operative risks; therefore, I recommended a partial colectomy to remove the  pathology.  Laparoscopic & open techniques were discussed.   Risks such as bleeding, infection, abscess, leak, reoperation, possible ostomy, hernia, heart attack, death, and other risks were discussed.  I noted a good likelihood this will help address the problem.   Goals of post-operative recovery were discussed as well.  We will work to minimize complications.  An educational handout on the pathology was given as well.  Questions were answered.    The patient expresses understanding & wishes to proceed with surgery.  He did have some confusion & has recently been diagnosed with Alzheimer's.  Family members trying to help him through this process and with memory.  They agreed to proceed and understand.  OR FINDINGS:   Patient had tattooing at the proximal transverse colon at the hepatic flexure.  Colonoscopy confirmed tattooing and persistent abnormal mucosal base from prior polypectomy.  Very thickened mid sigmoid with significant narrowing barely able to allow a scope through.  Dilated colon approximately.  It is an ileocolonic anastomosis that rests in the epigastric region.  He has a colorectal stapled EEA anastomosis.  31 Covidien EEA staple.  Anastomosis 12 cm from anal verge.  No obvious metastatic disease on visceral parietal peritoneum or liver.  DESCRIPTION:   Informed consent was confirmed.  The patient underwent general anaesthesia without difficulty.  The patient was positioned with arms tucked & secured appropriately.  VTE prevention in place.  The patient's abdomen was clipped, prepped, & draped in a sterile fashion.  Surgical timeout confirmed our plan.  The patient was positioned in reverse Trendelenburg.  Abdominal entry was gained using Veress needle technique using trach hook on the  anterior abdominal wall fascia for countertension in the left upper abdomen.  Entry was clean.  I induced carbon dioxide insufflation.  Camera inspection revealed no injury.  Extra ports were  carefully placed under direct laparoscopic visualization.  I did lysis of adhesions to free greater omentum of the anterior abdominal wall to better expose the anatomy.  There was no obvious tattooing on the left colon.  After some lysis adhesions were able to see an area at the hepatic flexure of the colon near his prior open cholecystectomy very suspicious for the area of concern.  Patient also had a very thickened twisted narrowed sigmoid colon consistent with the CT scan finding concerning for a stricture or mass.  After discussion I had Dr. Dema Severin perform colonoscopy while I laparoscopically clamped off the cecum.  The sigmoid colon was thickened and twisted and barely allowed his scope to go through.  Patient had a long left colon.  Somewhat dilated with a transition point at the sigmoid stricture.  Eventually after cleaning out a fair bowel prep he was able to come up to the area of tattooing that was at the hepatic flexure and confirmed abnormal mucosal scarring consistent with the partial polypectomy noted.  I decided to go him proceed with partial colectomy's of both areas of concern.  Started out with a right colectomy.  Ended up having to place more ports.  We docked the [employer] Vinc[employer] robot carefully and placed instruments under visualization  I mobilized & reflected the greater omentum and small bowel in the upper abdomen.  Did robotic lysis adhesions to help free the omentum and right colon.  Up and mobilized the greater omentum off the right colon as well.  I was able to elevate the proximal colon to isolate the ileocolonic pedicle.  I scored the ileal mesentery just proximal to that.   I carried that further dissection in a medial to lateral fashion.  I was able to bluntly get into the retro-mesenteric plane on the right side.  I freed the proximal right sided colonic mesentery off the retroperitoneum including the duodenal sweep, pancreatic head, & Gerota's fascia of the right kidney. I was  able to get underneath the hepatic flexure.  I was able to get underneath the proximal and mid transverse colon.  I isolated the proximal ileocecal pedicle.  I skeletonized it & transected the vessels.  I then did further dissection until I could actually reach the lateral attachments of the hepatic flexure and come down the lateral attachments of the ascending colon somewhat.  Found the area of tattooing and went distal to it, working to preserve a dominant middle colic pedicle.  Ended up having to transect the right colic in a good candidate for the right middle colic artery as well.  I then proceeded to mobilize the terminal ileum & proximal "right" colon in a lateral to medial fashion.  I mobilized the distal ileal mesentery off its retroperitoneal and pelvic attachments.  With his prior appendectomy and took some time to help mobilize his terminal ileum out the pelvis and dense pelvic adhesions.  Eventually he was able to mobilize the terminal ileum and right colon off the paracolic gutter and retroperitoneum.  I also mobilized the greater omentum off the mid transverse colon and mobilized the mid to proximal transverse colon in a superior to inferior fashion.  This allowed me to mobilize the hepatic flexure and get a complete mobilization of the proximal "right" colon to the mid-transverse colon.Marland Kitchen  I  could isolate the pathology.  When he went ahead and proceeded with transection.   I then transected transverse colon mesentery just proximal to a dominant middle colic arterial pedicle radially.  We did immunofluorescent firefly to prove good perfusion of the terminal ileum as well as the mid transverse colon at the planned stapled transection points.  Transected at the proximal transverse colon with a robotic stapler x 2 firings.  I transected the distal ileal mesentery and then transected at the distal ileum with a robotic stapler.  We assured hemostasis.   I did a side-to-side stapled anastomosis of ileum  to mid-transverse colon using a 42mm robotic stapler x 2 firings in an isoperistaltic fashion.  (Distal stump of ileum to mid transverse colon for the distal end of the anastomosis.  Proximal end of colon stump to more proximal ileum for the proximal end of the anastomosis).  I sewed the common staple channel wound with an absorbable suture ( 2-0 V-lock) in a running [place] fashion from each corner and meeting in the center.  I did meticulous inspection prove an airtight closure.  I protected the anastomosis line with an anterior omentopexy of greater omentum using V lock suture.  We did reinspection of the abdomen.  Hemostasis was good.   Ureters, retroperitoneum, and bowel uninjured.  The anastomosis looked healthy.     We then remove instruments and repositioned the patient and docked for a sigmoid colectomy.  Mobilized some lateral sidewall attachments to help straighten out the twisted thickened sigmoid colon.  Elevated the retro-sigmoid mesentery off the retroperitoneum.  Came through the avascular plane down towards the mesorectum over the sacral promontory.  Was able to identify the ureter, iliac, and other retroperitoneal structures and keep them in the retroperitoneal position.  Elevated the left colon mesentery up towards the mid descending colon.  Mobilized the sigmoid colon a lateral medial fashion.  Came down to the rectosigmoid junction, confirmed by splaying of the tinea, at the sacral promontory, no more diverticuli.  Transected  Mesorectum at this rectosigmoid junction.  We then look for the proximal point in the mid descending colon that would reach down to the sacral promontory.  Transected the left colon mesentery radially towards this, taking care to preserve most of the IMA pedicle and just really take off the superior hemorrhoidal.  Then did Firefly for this region to confirm good immunofluorescent vascular perfusion of the descending colon and rectal ends.  Transected at the rectosigmoid  junction with a robotic stapler x2 firings.  I created an extraction incision through a small Pfannenstiel incision in the suprapubic region.  As was the site of the 12 mm robotic stapler port.  Care to stay away from the bladder.  I placed a wound protector.  Right colon removed.  Could palpate the abnormality.  I later opened the distal end of the right colon and was able to expose the obvious stellate scarring and mucosal abnormality consistent with the incomplete polypectomy and possible early cancer.  Tattoo nearby.  I was able to eviscerate the rectosigmoid and descending colon out the wound.   I clamped the colon proximal to this area using a reusable pursestringer device.  Passed a 2-0 Keith needle. I transected at the descending/sigmoid junction with a scalpel. I got healthy bleeding mucosa.  We sent the rectosigmoid colon specimen off to go to pathology.  We sized the colon orifice.  I chose a 31 EEA anvil stapler system.  I reinforced the prolene pursestring with  interrupted silk suture.  I placed the anvil to the open end of the proximal remaining colon and closed around it using the pursestring.    We did copious irrigation with crystalloid solution.  Hemostasis was good.  The distal end of the remaining colon easily reached down to the rectal stump, therefore, splenic flexure mobilization was not needed.      Dr Dema Severin scrubbed down and did gentle anal dilation and advanced the EEA stapler up the rectal stump. The spike was brought out at the provimal end of the rectal stump under direct visualization.  I attached the anvil of the proximal colon the spike of the stapler. Anvil was tightened down and held clamped for 60 seconds. The EEA stapler was fired and held clamped for 30 seconds. The stapler was released & removed. We noted 2 excellent anastomotic rings. Blue stitch is in the proximal ring.  Dr Dema Severin did rigid proctoscopy noted the anastomosis was at 12 cm from the anal verge consistent with  the proximal rectum.  We did a final irrigation of antibiotic solution (900 mg clindamycin/240 mg gentamicin in a liter of crystalloid) & held that for the pelvic air leak test .  The rectum was insufflated the rectum while clamping the colon proximal to that anastomosis.  There was a negative air leak test. There was no tension of mesentery or bowel at the anastomosis.   Tissues looked viable.  Ureters & bowel uninjured.  The anastomosis looked healthy.   Endoluminal gas was evacuated.  Ports & wound protector removed.  We changed gloves & redraped the patient per colon SSI prevention protocol.  We aspirated the antibiotic irrigation.  Hemostasis was good.  Sterile unused instruments were used from this point.  I closed the skin at the port sites using Monocryl stitch and sterile dressing.  I closed the extraction wound using a 0 Vicryl vertical peritoneal closure and a #1 PDS transverse anterior rectal fascial closure like a small Pfannenstiel closure. I closed the skin with some interrupted Monocryl stitches. I placed antibiotic-soaked wicks into the closure at the corners x2.  I placed sterile dressings.     Patient is extubated & in the recovery room. I had discussed postop care with the patient in detail the office & in the holding area. Instructions are written. I discussed operative findings, updated the patient's status, discussed probable steps to recovery, and gave postoperative recommendations to the patient's family.  Recommendations were made.  Questions were answered.  They expressed understanding & appreciation.   [name], M.D., F.A.C.S. Gastrointestinal and Minimally Invasive Surgery [facility] [address], [place], [place] [address] [phone] Main / Paging

## 2017-11-26 NOTE — H&P (Signed)
Calvin Burnett DOB: [birth year] Widowed / Language: Cleophus Molt / Race: White Male  ` ` Patient sent for surgical consultation at the request of Dr. Lucio Edward  Chief Complaint: Abnormal polyp and left-sided colon by CT scan and colonoscopy. Not resectable by endoscopy. Request for surgical resection.  The patient is an elderly male with history of prior colonoscopies and colon polyps. Had some night sweats and generalized discomfort. Had some thickening in his proximal sigmoid colon by CT scan last November. Was referred back to gastroenterology. Had colonoscopy last month that confirmed a 4 cm mass in the left colon. Biopsies consistent with adenoma. Not able to be resected by endoscopy. Surgical consultation requested. Seems to correlate with the abnormal area in the proximal sigmoid colon by CT scan. Apparently patient has not been particular symptomatic with any bleeding or crampiness or discomfort. He is on Eliquis for atrial fibrillation.  No personal nor family history of GI/colon cancer, inflammatory bowel disease, irritable bowel syndrome, allergy such as Celiac Sprue, dietary/dairy problems, colitis, ulcers nor gastritis. No recent sick contacts/gastroenteritis. No travel outside the country. No changes in diet. No dysphagia to solids or liquids. No significant heartburn or reflux. No hematochezia, hematemesis, coffee ground emesis. No evidence of prior gastric/peptic ulceration. He had an open cholecystectomy in the distant past. Appendectomy. Usually moves his bowels every day. Not any fiber. He can walk 1/2-hour without difficulty. He comes today with his sister-in-law to help him. He is to be a diabetic controlled with diet and no medicines. Really just on Eliquis and nothing else at this point. He is never smoked.  Ready for surgery.  (Review of systems as stated in this history (HPI) or in the review of systems. Otherwise all other 12 point ROS are  negative) ` ` `   Allergies (Danielle Gerrigner, CMA; 09/30/2017 11:25 AM) No Known Drug Allergies [09/30/2017]: Allergies Reconciled  Medication History Levonne Spiller, CMA; 09/30/2017 11:26 AM) Eliquis (5MG  Tablet, Oral) Active. Medications Reconciled    Vitals (Danielle Gerrigner CMA; 09/30/2017 11:27 AM) 09/30/2017 11:26 AM Weight: 177 lb Height: 70in Body Surface Area: 1.98 m Body Mass Index: 25.4 kg/m  Temp.: 55F(Oral)  Pulse: 58 (Regular)  BP: 130/80 (Sitting, Left Arm, Standard)   BP (!) 130/92   Pulse 77   Temp 98.1 F (36.7 C) (Oral)   Resp 18   Ht 5\' 10"  (1.778 m)   Wt 78 kg (172 lb)   SpO2 100%   BMI 24.68 kg/m     Physical Exam [name] MD; 09/30/2017 11:53 AM)  General Mental Status-Alert. General Appearance-Not in acute distress, Not Sickly. Orientation-Oriented X3. Hydration-Well hydrated. Voice-Normal.  Integumentary Global Assessment Upon inspection and palpation of skin surfaces of the - Axillae: non-tender, no inflammation or ulceration, no drainage. and Distribution of scalp and body hair is normal. General Characteristics Temperature - normal warmth is noted.  Head and Neck Head-normocephalic, atraumatic with no lesions or palpable masses. Face Global Assessment - atraumatic, no absence of expression. Neck Global Assessment - no abnormal movements, no bruit auscultated on the right, no bruit auscultated on the left, no decreased range of motion, non-tender. Trachea-midline. Thyroid Gland Characteristics - non-tender.  Eye Eyeball - Left-Extraocular movements intact, No Nystagmus. Eyeball - Right-Extraocular movements intact, No Nystagmus. Cornea - Left-No Hazy. Cornea - Right-No Hazy. Sclera/Conjunctiva - Left-No scleral icterus, No Discharge. Sclera/Conjunctiva - Right-No scleral icterus, No Discharge. Pupil - Left-Direct reaction to light normal. Pupil -  Right-Direct reaction to light normal. Note: Wears  glasses. Vision corrected  ENMT Ears Pinna - Left - no drainage observed, no generalized tenderness observed. Right - no drainage observed, no generalized tenderness observed. Nose and Sinuses External Inspection of the Nose - no destructive lesion observed. Inspection of the nares - Left - quiet respiration. Right - quiet respiration. Mouth and Throat Lips - Upper Lip - no fissures observed, no pallor noted. Lower Lip - no fissures observed, no pallor noted. Nasopharynx - no discharge present. Oral Cavity/Oropharynx - Tongue - no dryness observed. Oral Mucosa - no cyanosis observed. Hypopharynx - no evidence of airway distress observed.  Chest and Lung Exam Inspection Movements - Normal and Symmetrical. Accessory muscles - No use of accessory muscles in breathing. Palpation Palpation of the chest reveals - Non-tender. Auscultation Breath sounds - Normal and Clear.  Cardiovascular Auscultation Rhythm - Regular. Murmurs & Other Heart Sounds - Auscultation of the heart reveals - No Murmurs and No Systolic Clicks.  Abdomen Inspection Inspection of the abdomen reveals - No Visible peristalsis and No Abnormal pulsations. Umbilicus - No Bleeding, No Urine drainage. Palpation/Percussion Palpation and Percussion of the abdomen reveal - Soft, Non Tender, No Rebound tenderness, No Rigidity (guarding) and No Cutaneous hyperesthesia. Note: Abdomen soft. Moderate diastases recti. Right subcostal incision consistent with prior open cholecystectomy. Right lower quadrant vertical incision consistent with prior appendectomy. Nontender. Not distended. No umbilical or incisional hernias. No guarding.  Male Genitourinary Sexual Maturity Tanner 5 - Adult hair pattern and Adult penile size and shape. Note: Obvious right inguinal hernia on Valsalva only. Not sensitive. Easily reducible. No hernia on left side. Otherwise normal external  genitalia  Rectal Note: Deferred given recent colonoscopy  Peripheral Vascular Upper Extremity Inspection - Left - No Cyanotic nailbeds, Not Ischemic. Right - No Cyanotic nailbeds, Not Ischemic.  Neurologic Neurologic evaluation reveals -normal attention span and ability to concentrate, able to name objects and repeat phrases. Appropriate fund of knowledge , normal sensation and normal coordination. Mental Status Affect - not angry, not paranoid. Cranial Nerves-Normal Bilaterally. Gait-Normal. Note: Mild weakness on shrug on right shoulder.  Neuropsychiatric Mental status exam performed with findings of-able to articulate well with normal speech/language, rate, volume and coherence, thought content normal with ability to perform basic computations and apply abstract reasoning and no evidence of hallucinations, delusions, obsessions or homicidal/suicidal ideation. Note: Mild fair long-term memory.  Musculoskeletal Global Assessment Spine, Ribs and Pelvis - no instability, subluxation or laxity. Right Upper Extremity - no instability, subluxation or laxity.  Lymphatic Head & Neck  General Head & Neck Lymphatics: Bilateral - Description - No Localized lymphadenopathy. Axillary  General Axillary Region: Bilateral - Description - No Localized lymphadenopathy. Femoral & Inguinal  Generalized Femoral & Inguinal Lymphatics: Left - Description - No Localized lymphadenopathy. Right - Description - No Localized lymphadenopathy.    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 Biopsy consistent with adenoma. Suspicion endoscopically for something more advanced like a cancer.  I think he would benefit from segmental colonic resection. Reasonable minimally invasive robotic approach. Perhaps could remove some of the  area of diverticular narrowing and thickening that seems like it mainly in that region anyway by CT scan  Cardiac clearance done.  Holding Eliquis perioperatively.  The anatomy & physiology of the digestive tract was discussed. The pathophysiology of the colon was discussed. Natural history risks without surgery was discussed. I feel  the risks of no intervention will lead to serious problems that outweigh the operative risks; therefore, I recommended a partial colectomy to remove the pathology. Minimally invasive (Robotic/Laparoscopic) & open techniques were discussed.  Risks such as bleeding, infection, abscess, leak, reoperation, possible ostomy, hernia, heart attack, death, and other risks were discussed. I noted a good likelihood this will help address the problem. Goals of post-operative recovery were discussed as well. Need for adequate nutrition, daily bowel regimen and healthy physical activity, to optimize recovery was noted as well. We will work to minimize complications. Educational materials were available as well. Questions were answered. The patient expresses understanding & wishes to proceed with surgery.  Pt Education - [facility] Colon Bowel Prep 2018 ERAS/Miralax/Antibiotics Started Neomycin Sulfate 500 MG Oral Tablet, 2 (two) Tablet SEE NOTE, #6, 09/30/2017, No Refill. Local Order: TAKE TWO TABLETS AT 2 PM, 3 PM, AND 10 PM THE DAY PRIOR TO SURGERY Started Flagyl 500 MG Oral Tablet, 2 (two) Tablet SEE NOTE, #6, 09/30/2017, No Refill. Local Order: Take at 2pm, 3pm, and 10pm the day prior to your colon operation Pt Education - Pamphlet Given - Laparoscopic Colorectal Surgery: discussed with patient and provided information. Pt Education - [facility] Colectomy post-op instructions: discussed with patient and provided information.  RIGHT INGUINAL HERNIA (K40.90) Impression: Not particularly large and asymptomatic. Would hold off on any intervention at this time. Certainly would not  try and repair at the same time of colectomy.  In the future, he may benefit from elective repair with mesh. We'll hold off for now  [name], M.D., F.A.C.S. Gastrointestinal and Minimally Invasive Surgery [facility] [address] [place], [place] [address] [phone] Main / Paging

## 2017-11-26 NOTE — Interval H&P Note (Signed)
History and Physical Interval Note:  11/26/2017 7:33 AM  [name]  has presented today for surgery, with the diagnosis of Colon polyp unresectable by colonoscopy  The various methods of treatment have been discussed with the patient and family. After consideration of risks, benefits and other options for treatment, the patient has consented to  Procedure(s): XI ROBOTIC [place] (N/A) RIGID PROCTOSCOPY (N/A) as a surgical intervention .  The patient's history has been reviewed, patient examined, no change in status, stable for surgery.  I have reviewed the patient's chart and labs.  Questions were answered to the patient's satisfaction.     Calvin Burnett

## 2017-11-27 DIAGNOSIS — D62 Acute posthemorrhagic anemia: Secondary | ICD-10-CM | POA: Insufficient documentation

## 2017-11-27 LAB — GLUCOSE, CAPILLARY
GLUCOSE-CAPILLARY: 156 mg/dL — AB (ref 65–99)
GLUCOSE-CAPILLARY: 136 mg/dL — AB (ref 65–99)
Glucose-Capillary: 124 mg/dL — ABNORMAL HIGH (ref 65–99)
Glucose-Capillary: 113 mg/dL — ABNORMAL HIGH (ref 65–99)

## 2017-11-27 LAB — CBC
HCT: 23.7 % — ABNORMAL LOW (ref 39.0–52.0)
Hemoglobin: 8.2 g/dL — ABNORMAL LOW (ref 13.0–17.0)
MCH: 31.2 pg (ref 26.0–34.0)
MCHC: 34.6 g/dL (ref 30.0–36.0)
MCV: 90.1 fL (ref 78.0–100.0)
PLATELETS: 157 10*3/uL (ref 150–400)
RBC: 2.63 MIL/uL — ABNORMAL LOW (ref 4.22–5.81)
RDW: 13.7 % (ref 11.5–15.5)
WBC: 8.5 10*3/uL (ref 4.0–10.5)

## 2017-11-27 LAB — BASIC METABOLIC PANEL
ANION GAP: 9 (ref 5–15)
BUN: 18 mg/dL (ref 6–20)
CO2: 23 mmol/L (ref 22–32)
CREATININE: 1.62 mg/dL — AB (ref 0.61–1.24)
Calcium: 7.3 mg/dL — ABNORMAL LOW (ref 8.9–10.3)
Chloride: 108 mmol/L (ref 101–111)
GFR, EST AFRICAN AMERICAN: 44 mL/min — AB (ref >60)
GFR, EST NON AFRICAN AMERICAN: 38 mL/min — AB (ref >60)
GLUCOSE: 168 mg/dL — AB (ref 65–99)
POTASSIUM: 4.1 mmol/L (ref 3.5–5.1)
SODIUM: 140 mmol/L (ref 135–145)

## 2017-11-27 LAB — MAGNESIUM: MAGNESIUM: 1.4 mg/dL — AB (ref 1.7–2.4)

## 2017-11-27 MED ORDER — MAGNESIUM SULFATE 4 GM/100ML IV SOLN
4.0000 g | Freq: Once | INTRAVENOUS | Status: AC
  Administered 2017-11-27: 4 g via INTRAVENOUS
  Filled 2017-11-27: qty 100

## 2017-11-27 MED ORDER — ALBUMIN HUMAN 5 % IV SOLN
25.0000 g | Freq: Once | INTRAVENOUS | Status: AC
  Administered 2017-11-27: 25 g via INTRAVENOUS
  Filled 2017-11-27: qty 500

## 2017-11-27 MED ORDER — ALBUMIN HUMAN 5 % IV SOLN
25.0000 g | Freq: Once | INTRAVENOUS | Status: AC
  Administered 2017-11-27: 25 g via INTRAVENOUS
  Filled 2017-11-27 (×2): qty 500

## 2017-11-27 MED ORDER — ALBUMIN HUMAN 5 % IV SOLN
12.5000 g | Freq: Once | INTRAVENOUS | Status: AC
  Administered 2017-11-27: 12.5 g via INTRAVENOUS
  Filled 2017-11-27: qty 250

## 2017-11-27 MED ORDER — LACTATED RINGERS IV BOLUS
1000.0000 mL | Freq: Once | INTRAVENOUS | Status: AC
  Administered 2017-11-27: 1000 mL via INTRAVENOUS

## 2017-11-27 NOTE — Plan of Care (Signed)
  Problem: Education: Goal: Knowledge of General Education information will improve Outcome: Progressing   Problem: Health Behavior/Discharge Planning: Goal: Ability to manage health-related needs will improve Outcome: Progressing   Problem: Clinical Measurements: Goal: Ability to maintain clinical measurements within normal limits will improve Outcome: Progressing Goal: Will remain free from infection Outcome: Progressing Goal: Diagnostic test results will improve Outcome: Progressing Goal: Respiratory complications will improve Outcome: Progressing Goal: Cardiovascular complication will be avoided Outcome: Progressing   Problem: Activity: Goal: Risk for activity intolerance will decrease Outcome: Progressing   Problem: Nutrition: Goal: Adequate nutrition will be maintained Outcome: Progressing   Problem: Coping: Goal: Level of anxiety will decrease Outcome: Progressing   Problem: Elimination: Goal: Will not experience complications related to bowel motility Outcome: Progressing Goal: Will not experience complications related to urinary retention Outcome: Progressing   Problem: Pain Managment: Goal: General experience of comfort will improve Outcome: Progressing   Problem: Safety: Goal: Ability to remain free from injury will improve Outcome: Progressing   Problem: Skin Integrity: Goal: Risk for impaired skin integrity will decrease Outcome: Progressing   Problem: Education: Goal: Understanding of discharge needs will improve Outcome: Progressing Goal: Verbalization of understanding of the causes of altered bowel function will improve Outcome: Progressing   Problem: Activity: Goal: Ability to tolerate increased activity will improve Outcome: Progressing   Problem: Bowel/Gastric: Goal: Gastrointestinal status for postoperative course will improve Outcome: Progressing   Problem: Health Behavior/Discharge Planning: Goal: Identification of community  resources to assist with postoperative recovery needs will improve Outcome: Progressing   Problem: Nutritional: Goal: Will attain and maintain optimal nutritional status will improve Outcome: Progressing   Problem: Clinical Measurements: Goal: Postoperative complications will be avoided or minimized Outcome: Progressing   Problem: Respiratory: Goal: Respiratory status will improve Outcome: Progressing   Problem: Skin Integrity: Goal: Will show signs of wound healing Outcome: Progressing

## 2017-11-27 NOTE — Progress Notes (Addendum)
[address] [place], [place] [address] Phone: [phone]  FAX: [phone]      RADFORD PEASE [id number] 04/23/1934  CARE TEAM:  PCP: Marin Olp, MD  Outpatient Care Team: Patient Care Team: Marin Olp, MD as PCP - General (Family Medicine) Michael Boston, MD as Consulting Physician (General Surgery) Ladene Artist, MD as Consulting Physician (Gastroenterology) Stanford Breed Denice Bors, MD as Consulting Physician (Cardiology)  Inpatient Treatment Team: Treatment Team: Attending Provider: Michael Boston, MD; Technician: Leda Quail, NT; Technician: Rosalia Hammers, NT   Problem List:   Principal Problem:   Polyp of hepatic flexure s/p robotic right colectomy 11/26/2017 Active Problems:   Alzheimer's dementia   Essential hypertension   CKD (chronic kidney disease), stage III ([name])   Paroxysmal atrial fibrillation ([place])   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  -Transfuse if hemoglobin less than 7 or persistent hypotension today.  He looks much better now.  Dysphasia diet.  Follow-up in pathology.  Hold anticoagulation today.  If hemoglobin stable for 48 hours and blood pressure good, can restart.  -hypoMag - replace & follow  -VTE prophylaxis- SCDs, etc  -mobilize as tolerated to help recovery  20 minutes spent in review, evaluation, examination, counseling, and coordination of care.  More than 50% of  that time was spent in counseling.  [name], M.D., F.A.C.S. Gastrointestinal and Minimally Invasive Surgery [facility] [address], [place] [address] [place], [place] [address] [phone] Main / Paging   11/27/2017    Subjective: (Chief complaint)  Soft blood pressure.  Asymptomatic.  Received volume through the night  Mild blood in stools.  He denies pain.  Feels good.  Objective:  Vital signs:  Vitals:   11/27/17 0209 11/27/17 0259 11/27/17 0513 11/27/17 0535  BP: (!) 88/53 (!) 94/55 (!) 83/55 (!) 81/57  Pulse: 65 68 91 71  Resp:   13   Temp:   98.9 F (37.2 C)   TempSrc:   Oral   SpO2:   96%   Weight:      Height:        Last BM Date: 11/26/17  Intake/Output   Yesterday:  05/15 0701 - 05/16 0700 In: 3215 [I.V.:2415; IV Piggyback:800] Out: 9628 [ZMOQH:4765; Blood:250] This shift:  Total I/O In: -  Out: 650 [Urine:650]  Bowel function:  Flatus: YES  BM:  YES  Drain: (No drain)   Physical Exam:  General: Pt awake/alert/oriented x4 in no acute distress Eyes: PERRL, normal EOM.  Sclera clear.  No icterus Neuro: CN II-XII intact w/o focal sensory/motor deficits. Lymph: No head/neck/groin lymphadenopathy Psych:  No delerium/psychosis/paranoia HENT: Normocephalic, Mucus membranes moist.  No thrush Neck: Supple, No tracheal deviation Chest: No chest wall pain w good excursion CV:  Pulses intact.  Regular rhythm MS: Normal AROM mjr joints.  No obvious deformity  Abdomen: Soft.  Nondistended.  Nontender.  No evidence of peritonitis.  No incarcerated hernias.  Ext:  No deformity.  No mjr edema.  No cyanosis Skin: No petechiae / purpura  Results:   Labs: Results for orders placed or performed during the hospital encounter of 11/26/17 (from the past 48 hour(s))  Glucose, capillary     Status: Abnormal   Collection Time: 11/26/17  6:56 AM  Result Value Ref Range   Glucose-Capillary 140 (H) 65 - 99 mg/dL   Comment 1  Notify RN   Glucose, capillary     Status: Abnormal   Collection Time: 11/26/17  2:13 PM  Result Value Ref Range   Glucose-Capillary 189 (H) 65 - 99 mg/dL   Comment 1 Notify RN    Comment 2 Document in Chart   Glucose, capillary     Status: Abnormal   Collection Time: 11/26/17  5:02 PM  Result Value Ref Range   Glucose-Capillary 213 (H) 65 - 99 mg/dL  Glucose, capillary     Status: Abnormal   Collection Time: 11/26/17  8:59 PM  Result Value Ref Range   Glucose-Capillary 171 (H) 65 - 99 mg/dL  Basic metabolic panel     Status: Abnormal   Collection Time: 11/27/17  4:52 AM  Result Value Ref Range   Sodium 140 135 - 145 mmol/L   Potassium 4.1 3.5 - 5.1 mmol/L   Chloride 108 101 - 111 mmol/L   CO2 23 22 - 32 mmol/L   Glucose, Bld 168 (H) 65 - 99 mg/dL   BUN 18 6 - 20 mg/dL   Creatinine, Ser 1.62 (H) 0.61 - 1.24 mg/dL   Calcium 7.3 (L) 8.9 - 10.3 mg/dL   GFR calc non Af Amer 38 (L) >60 mL/min   GFR calc Af Amer 44 (L) >60 mL/min    Comment: (NOTE) The eGFR has been calculated using the CKD EPI equation. This calculation has not been validated in all clinical situations. eGFR's persistently <60 mL/min signify possible Chronic Kidney Disease.    Anion gap 9 5 - 15    Comment: Performed at [facility], [place] [address], [place], [place] [address]    Imaging / Studies: No results found.  Medications / Allergies: per chart  Antibiotics: Anti-infectives (From admission, onward)   Start     Dose/Rate Route Frequency Ordered Stop   11/26/17 2100  cefoTEtan (CEFOTAN) 2 g in sodium chloride 0.9 % 100 mL IVPB     2 g 200 mL/hr over 30 Minutes Intravenous Every 12 hours 11/26/17 1602 11/26/17 2115   11/26/17 1309  clindamycin (CLEOCIN) 900 mg, gentamicin (GARAMYCIN) 240 mg in sodium chloride 0.9 % 1,000 mL for intraperitoneal lavage  Status:  Discontinued       As needed 11/26/17 1309 11/26/17 1420   11/26/17 0645  cefoTEtan in Dextrose 5% (CEFOTAN) IVPB 2 g     2  g Intravenous On call to O.R. 11/26/17 8366 11/26/17 0855   11/26/17 0645  clindamycin (CLEOCIN) 900 mg, gentamicin (GARAMYCIN) 240 mg in sodium chloride 0.9 % 1,000 mL for intraperitoneal lavage  Status:  Discontinued      Intraperitoneal To Surgery 11/26/17 2947 11/26/17 1542        Note: Portions of this report may have been transcribed using voice recognition software. Every effort was made to ensure accuracy; however, inadvertent computerized transcription errors may be present.   Any transcriptional errors that result from this process are unintentional.     [name], M.D., F.A.C.S. Gastrointestinal and Minimally Invasive Surgery [facility] [address] [place], [place] [address] [phone]  [phone] Main / Paging   11/27/2017

## 2017-11-27 NOTE — Progress Notes (Signed)
Paged Dr gross about pt hypotension and large amount of rectal bleeding. Pt is not symptomatic and is pain free. New Orders have been placed.

## 2017-11-27 NOTE — Progress Notes (Signed)
With BP 81/57 and albumin infusing, nurse paged Dr gross to confirm 3rd bolus with albumin. Unsuccessful attempt at a second IV start, IV team paged. Awaiting confirmation from Dr Johney Maine to start 3rd LR bolus.   New orders placed.

## 2017-11-27 NOTE — Progress Notes (Addendum)
Initial Nutrition Assessment   Nutrition Brief Note  Patient identified on the Malnutrition Screening Tool (MST) Report  Wt Readings from Last 15 Encounters:  11/26/17 172 lb (78 kg)  11/25/17 172 lb (78 kg)  11/20/17 176 lb 6.4 oz (80 kg)  09/03/17 176 lb (79.8 kg)  08/29/17 175 lb 12.8 oz (79.7 kg)  08/19/17 170 lb (77.1 kg)  08/07/17 176 lb (79.8 kg)  07/31/17 176 lb (79.8 kg)  07/28/17 178 lb 9.6 oz (81 kg)  06/12/17 174 lb (78.9 kg)  06/02/17 175 lb 4 oz (79.5 kg)  05/12/17 173 lb 6 oz (78.6 kg)  05/06/17 176 lb 2 oz (79.9 kg)  04/29/17 174 lb 3.2 oz (79 kg)  04/08/17 174 lb 3.2 oz (79 kg)    Body mass index is 24.68 kg/m. Patient meets criteria for normal weight based on current BMI.   Pt and daughter report pt was eating well PTA. Weight loss was 15 lbs over 3 years, but has been weight stable recently. Weight loss brought on by grief as pt lost 3 close family members in 1 year.   Current diet order is D1, patient is consuming approximately 100% of all ensure BID, lunch pending at this time. Labs and medications reviewed.   Continue Ensure Enlive po BID, each supplement provides 350 kcal and 20 grams of protein  No other nutrition interventions warranted at this time. If nutrition issues arise, please consult RD.   Hope Budds, Dietetic Intern

## 2017-11-27 NOTE — Progress Notes (Signed)
ERAS education reinforced. Did patient attend class prior to procedure? Yes [   ] No [ x  ] Discussed: Pain Control [  x ] Mobility [  x ] Diet [x   ] Other [   ] Visit patient earlier today during rounds. Found in bed. Reports he has been out of bed in the chair earlier today. Also reports that he is not having pain. Encouraged patient to eat all meals in the chair and to report to nursing how long he stays out of bed. Mobility is measured in minutes instead of distance. Pecolia Ades, RN, BSN Quality Program Coordinator, Enhanced Recovery after Surgery 11/27/17 2:43 PM

## 2017-11-28 LAB — BASIC METABOLIC PANEL
Anion gap: 7 (ref 5–15)
BUN: 16 mg/dL (ref 6–20)
CHLORIDE: 112 mmol/L — AB (ref 101–111)
CO2: 24 mmol/L (ref 22–32)
CREATININE: 1.31 mg/dL — AB (ref 0.61–1.24)
Calcium: 7.9 mg/dL — ABNORMAL LOW (ref 8.9–10.3)
GFR calc Af Amer: 57 mL/min — ABNORMAL LOW (ref >60)
GFR, EST NON AFRICAN AMERICAN: 49 mL/min — AB (ref >60)
GLUCOSE: 131 mg/dL — AB (ref 65–99)
Potassium: 4.1 mmol/L (ref 3.5–5.1)
SODIUM: 143 mmol/L (ref 135–145)

## 2017-11-28 LAB — CBC
HEMATOCRIT: 20.9 % — AB (ref 39.0–52.0)
Hemoglobin: 7.2 g/dL — ABNORMAL LOW (ref 13.0–17.0)
MCH: 31.6 pg (ref 26.0–34.0)
MCHC: 34.4 g/dL (ref 30.0–36.0)
MCV: 91.7 fL (ref 78.0–100.0)
Platelets: 123 10*3/uL — ABNORMAL LOW (ref 150–400)
RBC: 2.28 MIL/uL — ABNORMAL LOW (ref 4.22–5.81)
RDW: 14.1 % (ref 11.5–15.5)
WBC: 5.4 10*3/uL (ref 4.0–10.5)

## 2017-11-28 LAB — GLUCOSE, CAPILLARY
GLUCOSE-CAPILLARY: 147 mg/dL — AB (ref 65–99)
GLUCOSE-CAPILLARY: 145 mg/dL — AB (ref 65–99)
GLUCOSE-CAPILLARY: 117 mg/dL — AB (ref 65–99)
Glucose-Capillary: 109 mg/dL — ABNORMAL HIGH (ref 65–99)

## 2017-11-28 LAB — MAGNESIUM: MAGNESIUM: 2.5 mg/dL — AB (ref 1.7–2.4)

## 2017-11-28 MED ORDER — SODIUM CHLORIDE 0.9% FLUSH
10.0000 mL | Freq: Two times a day (BID) | INTRAVENOUS | Status: DC
  Administered 2017-11-28 – 2017-12-02 (×7): 10 mL via INTRAVENOUS

## 2017-11-28 MED ORDER — FUROSEMIDE 40 MG PO TABS
40.0000 mg | ORAL_TABLET | Freq: Once | ORAL | Status: AC
  Administered 2017-11-28: 40 mg via ORAL
  Filled 2017-11-28: qty 1

## 2017-11-28 MED ORDER — PSYLLIUM 95 % PO PACK
1.0000 | PACK | Freq: Every day | ORAL | Status: DC
  Administered 2017-11-28 – 2017-12-02 (×5): 1 via ORAL
  Filled 2017-11-28 (×5): qty 1

## 2017-11-28 NOTE — Progress Notes (Signed)
[address] [place], [place] [address] Phone: [phone]  FAX: [phone]      Calvin Burnett [id number] [birth year]  CARE TEAM:  PCP: Marin Olp, MD  Outpatient Care Team: Patient Care Team: Marin Olp, MD as PCP - General (Family Medicine) Michael Boston, MD as Consulting Physician (General Surgery) Ladene Artist, MD as Consulting Physician (Gastroenterology) Stanford Breed Denice Bors, MD as Consulting Physician (Cardiology)  Inpatient Treatment Team: Treatment Team: Attending Provider: Michael Boston, MD; Technician: Leda Quail, NT   Problem List:   Principal Problem:   Polyp of hepatic flexure s/p robotic right colectomy 11/26/2017 Active Problems:   Alzheimer's dementia   Essential hypertension   GERD   Diabetes mellitus type II, controlled ([place])   CKD (chronic kidney disease), stage III ([place])   Paroxysmal atrial fibrillation ([place])   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  -Transfuse if hemoglobin less than 7 or persistent hypotension today.  He looks much better now, so hopefully not likely.  Solid diet.  Follow-up in pathology.  Hold anticoagulation today.  If hemoglobin stable for 48 hours and blood pressure good, can restart 5/18 Sat.  -hypoMag - replaced.  Follow  -VTE prophylaxis- SCDs, etc  -mobilize as tolerated to help recovery  20  minutes spent in review, evaluation, examination, counseling, and coordination of care.  More than 50% of that time was spent in counseling.  [name], M.D., F.A.C.S. Gastrointestinal and Minimally Invasive Surgery [facility] [address] [place] [address] [place], [place] [address] [phone] Main / Paging   11/28/2017    Subjective: (Chief complaint)  Soft blood pressure but better.  Asymptomatic Mild blood in stools. Walked in room He denies pain.  Feels good.  Objective:  Vital signs:  Vitals:   11/27/17 1449 11/27/17 1804 11/27/17 2139 11/28/17 0623  BP: 110/73 92/63 117/68 (!) 97/50  Pulse: 93 80 88 66  Resp: _0 Temp: 99.5 F (37.5 C) 97.9 F (36.6 C) 98.9 F (37.2 C) 98.7 F (37.1 C)  TempSrc: Oral Oral Oral Oral  SpO2: 96% 96% 97% 98%  Weight:      Height:        Last BM Date: 11/26/17  Intake/Output   Yesterday:  05/16 0701 - 05/17 0700 In: 1610 [P.O.:1610] Out: 2275 [Urine:2275] This shift:  Total I/O In: 960 [P.O.:960] Out: 1275 [Urine:1275]  Bowel function:  Flatus: YES  BM:  YES  Drain: (No drain)   Physical Exam:  General: Pt awake/alert/oriented x4 in no acute distress Eyes: PERRL, normal EOM.  Sclera clear.  No icterus Neuro: CN II-XII intact w/o focal sensory/motor deficits. Lymph: No head/neck/groin lymphadenopathy Psych:  No delerium/psychosis/paranoia HENT: Normocephalic, Mucus membranes moist.  No thrush Neck: Supple, No tracheal deviation Chest: No chest wall pain w good excursion CV:  Pulses intact.  Regular rhythm MS: Normal AROM mjr joints.  No obvious deformity  Abdomen: Soft.  Nondistended.  Nontender.  No evidence of peritonitis.  No incarcerated hernias.  Ext:  No deformity.  No mjr edema.  No cyanosis Skin: No petechiae / purpura  Results:   Labs: Results for orders placed or performed during the hospital encounter of 11/26/17 (from the past 48 hour(s))  Glucose,  capillary     Status: Abnormal   Collection Time: 11/26/17  6:56 AM  Result Value Ref Range   Glucose-Capillary 140 (H) 65 - 99 mg/dL   Comment 1 Notify RN   Glucose, capillary     Status: Abnormal   Collection Time: 11/26/17  2:13 PM  Result Value Ref Range   Glucose-Capillary 189 (H) 65 - 99 mg/dL   Comment 1 Notify RN    Comment 2 Document in Chart   Glucose, capillary     Status: Abnormal   Collection Time: 11/26/17  5:02 PM  Result Value Ref Range   Glucose-Capillary 213 (H) 65 - 99 mg/dL  Glucose, capillary     Status: Abnormal   Collection Time: 11/26/17  8:59 PM  Result Value Ref Range   Glucose-Capillary 171 (H) 65 - 99 mg/dL  Basic metabolic panel     Status: Abnormal   Collection Time: 11/27/17  4:52 AM  Result Value Ref Range   Sodium 140 135 - 145 mmol/L   Potassium 4.1 3.5 - 5.1 mmol/L   Chloride 108 101 - 111 mmol/L   CO2 23 22 - 32 mmol/L   Glucose, Bld 168 (H) 65 - 99 mg/dL   BUN 18 6 - 20 mg/dL   Creatinine, Ser 1.62 (H) 0.61 - 1.24 mg/dL   Calcium 7.3 (L) 8.9 - 10.3 mg/dL   GFR calc non Af Amer 38 (L) >60 mL/min   GFR calc Af Amer 44 (L) >60 mL/min    Comment: (NOTE) The eGFR has been calculated using the CKD EPI equation. This calculation has not been validated in all clinical situations. eGFR's persistently <60 mL/min signify possible Chronic Kidney Disease.    Anion gap 9 5 - 15    Comment: Performed at [facility], [place] [address], [place], [place] [address]  CBC     Status: Abnormal   Collection Time: 11/27/17  4:52 AM  Result Value Ref Range   WBC 8.5 4.0 - 10.5 K/uL   RBC 2.63 (L) 4.22 - 5.81 MIL/uL   Hemoglobin 8.2 (L) 13.0 - 17.0 g/dL    Comment: REPEATED TO VERIFY DELTA CHECK NOTED RESULT CHECKED    HCT 23.7 (L) 39.0 - 52.0 %   MCV 90.1 78.0 - 100.0 fL   MCH 31.2 26.0 - 34.0 pg   MCHC 34.6 30.0 - 36.0 g/dL   RDW 13.7 11.5 - 15.5 %   Platelets 157 150 - 400 K/uL    Comment: Performed at [facility], [place] [address], [place], [place] [address]  Magnesium     Status: Abnormal   Collection Time: 11/27/17  4:52 AM  Result Value Ref Range   Magnesium 1.4 (L) 1.7 - 2.4 mg/dL    Comment: Performed at [facility], [place] [address] [place],  [address]  Glucose, capillary     Status: Abnormal   Collection Time: 11/27/17  7:25 AM  Result Value Ref Range   Glucose-Capillary 124 (H) 65 - 99 mg/dL  Glucose, capillary     Status: Abnormal   Collection Time: 11/27/17 11:56 AM  Result Value Ref Range   Glucose-Capillary  113 (H) 65 - 99 mg/dL  Glucose, capillary     Status: Abnormal   Collection Time: 11/27/17  4:39 PM  Result Value Ref Range   Glucose-Capillary 156 (H) 65 - 99 mg/dL  Glucose, capillary     Status: Abnormal   Collection Time: 11/27/17  9:37 PM  Result Value Ref Range   Glucose-Capillary 136 (H) 65 - 99 mg/dL  Basic metabolic panel     Status: Abnormal   Collection Time: 11/28/17  4:49 AM  Result Value Ref Range   Sodium 143 135 - 145 mmol/L   Potassium 4.1 3.5 - 5.1 mmol/L   Chloride 112 (H) 101 - 111 mmol/L   CO2 24 22 - 32 mmol/L   Glucose, Bld 131 (H) 65 - 99 mg/dL   BUN 16 6 - 20 mg/dL   Creatinine, Ser 1.31 (H) 0.61 - 1.24 mg/dL   Calcium 7.9 (L) 8.9 - 10.3 mg/dL   GFR calc non Af Amer 49 (L) >60 mL/min   GFR calc Af Amer 57 (L) >60 mL/min    Comment: (NOTE) The eGFR has been calculated using the CKD EPI equation. This calculation has not been validated in all clinical situations. eGFR's persistently <60 mL/min signify possible Chronic Kidney Disease.    Anion gap 7 5 - 15    Comment: Performed at [facility], [place] [address] [place], [place] [address]  CBC     Status: Abnormal   Collection Time: 11/28/17  4:49 AM  Result Value Ref Range   WBC 5.4 4.0 - 10.5 K/uL   RBC 2.28 (L) 4.22 - 5.81 MIL/uL   Hemoglobin 7.2 (L) 13.0 - 17.0 g/dL   HCT 20.9 (L) 39.0 - 52.0 %   MCV 91.7 78.0 - 100.0 fL   MCH 31.6  26.0 - 34.0 pg   MCHC 34.4 30.0 - 36.0 g/dL   RDW 14.1 11.5 - 15.5 %   Platelets 123 (L) 150 - 400 K/uL    Comment: Performed at [facility], [place] [address] [place], [place] [address]  Magnesium     Status: Abnormal   Collection Time: 11/28/17  4:49 AM  Result Value Ref Range   Magnesium 2.5 (H) 1.7 - 2.4 mg/dL    Comment: Performed at [facility], [place] [address] [place], [place] [address]    Imaging / Studies: No results found.  Medications / Allergies: per chart  Antibiotics: Anti-infectives (From admission, onward)   Start     Dose/Rate Route Frequency Ordered Stop   11/26/17 2100  cefoTEtan (CEFOTAN) 2 g in sodium chloride 0.9 % 100 mL IVPB     2 g 200 mL/hr over 30 Minutes Intravenous Every 12 hours 11/26/17 1602 11/26/17 2115   11/26/17 1309  clindamycin (CLEOCIN) 900 mg, gentamicin (GARAMYCIN) 240 mg in sodium chloride 0.9 % 1,000 mL for intraperitoneal lavage  Status:  Discontinued       As needed 11/26/17 1309 11/26/17 1420   11/26/17 0645  cefoTEtan in Dextrose 5% (CEFOTAN) IVPB 2 g     2 g Intravenous On call to O.R. 11/26/17 1275 11/26/17 0855   11/26/17 0645  clindamycin (CLEOCIN) 900 mg, gentamicin (GARAMYCIN) 240 mg in sodium chloride 0.9 % 1,000 mL for intraperitoneal lavage  Status:  Discontinued      Intraperitoneal To Surgery 11/26/17 1700 11/26/17 1542        Note: Portions of this report may have been transcribed using voice recognition software. Every  effort was made to ensure accuracy; however, inadvertent computerized transcription errors may be present.   Any transcriptional errors that result from this process are unintentional.     [name], M.D., F.A.C.S. Gastrointestinal and Minimally Invasive Surgery [facility] [address] [place], [place] [address] [phone] Main / Paging   11/28/2017

## 2017-11-28 NOTE — Progress Notes (Addendum)
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  Reviewed w Dr Saralyn Pilar w Pathology - cure rate should be good w surgery only Also had concurrent sigmoid colectomy for tight diverticular stricture  Doing well so far.  Hopefully home this weekend  Calvin Burnett, M.D., F.A.C.S. Gastrointestinal and Minimally Invasive Surgery [facility] [address] [place], [place] [address] [phone] Main / Paging  Patient: Calvin Burnett, Calvin Burnett Collected: 11/26/2017 Client: [facility] Accession: [id number] Received: 11/26/2017 Calvin Boston, MD DOB: 1934-03-01 Age: 84 Gender: M Reported: 11/28/2017 Calvin Burnett Patient Ph: [phone] MRN #: [id number] [place], Calvin Burnett [address] Visit #: [id number] Chart #: Phone: [phone] Fax: CC: REPORT OF SURGICAL PATHOLOGY FINAL DIAGNOSIS Diagnosis 1. Colon, segmental resection, sigmoid, open end proximal - STRICTURE WITH DIVERTICULOSIS. - ONE BENIGN LYMPH NODE (0/1). - NO EVIDENCE OF MALIGNANCY. 2. Colon, segmental resection for tumor, ascending - INVASIVE COLORECTAL ADENOCARCINOMA, 1 CM ARISING IN A SESSILE TUBULAR ADENOMA. - TUMOR EXTENDS INTO SUBMUCOSA. - MARGINS NOT INVOLVED. - NINETEEN BENIGN LYMPH NODES (0/19). - SEPARATE CECAL TUBULAR ADENOMA. 3. Colon, resection margin (donut), distal anastomotic ring - BENIGN COLON. - NO EVIDENCE OF MALIGNANCY. Microscopic Comment 2. COLON AND RECTUM: Resection, Including Transanal Disk Excision of Rectal Neoplasms Procedure: Right hemicolectomy, sigmoid stricture resection and distal margin anastomotic ring. Tumor Site: Transverse. Tumor Size: 1 cm arising in a 1.4 cm sessile tubular adenoma. Macroscopic Tumor Perforation: No. Histologic Type: Colorectal adenocarcinoma. Histologic Grade: Well differentiated, grade I. Tumor Extension: Into submucosa. Margins: Free of  tumor. Treatment Effect: No. Lymphovascular Invasion: No. Perineural Invasion: No. Tumor Deposits: No. Regional Lymph Nodes: 19 lymph nodes submitted or found. Number of Lymph Nodes Involved: 0. 1 of 3 FINAL for Calvin Burnett, Calvin Burnett [phone]) Microscopic Comment(continued) Number of Lymph Nodes Examined: 19. Pathologic Stage Classification (pTNM, AJCC 8th Edition): pT1, pN0. Ancillary Studies: Can be performed if requested (MMR / MSI testing: ) Representative tumor block: 2E and 22F. Comments: There is a sessile 1.4 cm tubular adenoma which has a 1.0 cm focus of invasive carcinoma which extends into the submucosa and does not involve the muscularis propria. There is a separate 0.6 cm in greatest dimension tubular adenoma within the cecum. Called to Dr. Johney Burnett on 11/28/17. ([id number]) Calvin Laws MD Pathologist, Electronic Signature (Case signed 11/28/2017) Specimen Calvin Burnett and Clinical Information Specimen(s) Obtained: 1. Colon, segmental resection, sigmoid, open end proximal 2. Colon, segmental resection for tumor, ascending 3. Colon, resection margin (donut), distal anastomotic ring Specimen Clinical Information 1. sigmoid stricture, polyp of hepatic flexure of colon [rd] Calvin Burnett 1. Specimen: Sigmoid colon. Specimen integrity: The proximal end is received open per requistion sheet. Length: 14 x 7 cm. Serosa: Tan-pink, smooth, focal area of indurated fat wrapping of the serosal surface in the area of a possible stricture. Contents: There is minimal tan green soft contents. Mucosa/Wall: The bowel is strictured at the proximal-third of the specimen, and there is prominent redundant mucosal folding and thickened wall up to 0.6 cm in this area. There is a 1.5 x 1.2 x 1 cm non perforated diverticula present at the stricture. There is an additional 0.4 cm possible polyp at the distal third of the specimen grossly limited to the mucosa. Lymph nodes: A single possible 0.5 cm lymph node is  sampled from the soft tissue. Block Summary: A= proximal margin en face. B= distal margin en face. C= stricture with diverticulum. D=  possible polyp with one lymph node whole. 2. Specimen: Terminal ileum, cecum, ascending colon, transverse colon. Specimen integrity: Intact. Specimen length: Terminal ileum is 6 cm, the cecum is 6 x 5 x 2.5 cm, and the ascending colon is 8.5 cm the portion of transverse colon is 14 cm. Appendix is grossly absent. Mesorectal intactness: N/A Tumor location: There is a single sessile polyp in the transverse colon, proximal to an area of tattoing. Tumor size: 1.4 x 1.3 cm. Percent of bowel circumference involved: 10-20% 2 of 3 FINAL for Calvin Burnett, Calvin Burnett [phone]) Calvin Burnett(continued) Tumor distance to margins: Proximal: 27 cm Distal: 5.5 cm Mesenteric (sigmoid and transverse): 7 cm Radial (posterior ascending, posterior descending; lateral and posterior mid-rectum; and entire lower 1/3 rectum): Greater than 10 cm. Macroscopic extent of tumor invasion: Grossly limited to the mucosa. Total presumed lymph nodes: Twenty-one lymph nodes are identified ranging from 0.2 up to 1 cm with tan pink and brown black discolored cut surfaces. Extramural satellite tumor nodules: None Mucosal polyp(s): There is an additional 0.6 cm possible polyp in the cecum, grossly limited to mucosa. Additional findings: None. Block summary: A= proximal margin en face. B= distal margin en face. C-G= transverse colon lesion entirely. H= cecal polyp entirely. I= ileocecal valve. J= four lymph nodes whole. K= four lymph nodes whole. L= four lymph nodes whole. M= four lymph nodes whole. N= five lymph nodes whole. 3. Received in formalin is a 0.8 cm in length x 2 cm in diameter donut shape portion of bowel, received with one end stapled. The stapled surface en face in block 3A. (AK:gt, 11/27/17) Report signed out from the following location(s) Technical component and interpretation  was performed at [facility] [place], [place], [place] [address]. CLIA #: [id number],

## 2017-11-28 NOTE — Progress Notes (Signed)
Noted bruising on the left and right flank area. Will mark when materials provides skin marker. 2nd nurse to assess.

## 2017-11-28 NOTE — Progress Notes (Signed)
Pharmacy Brief Note - Alvimopan (Entereg)  The standing order set for alvimopan (Entereg) now includes an automatic order to discontinue the drug after the patient has had a bowel movement. The change was approved by the [place] and the Medical Executive Committee.   This patient has had bowel movements documented by nursing. Therefore, alvimopan has been discontinued. If there are questions, please contact the pharmacy at [phone].   Thank you- Dia Sitter, PharmD, BCPS 11/28/2017 10:18 AM

## 2017-11-28 NOTE — Plan of Care (Signed)
Plan of care discussed with patient and daughter 

## 2017-11-28 NOTE — Anesthesia Postprocedure Evaluation (Signed)
Anesthesia Post Note  Patient: Calvin Burnett  Procedure(s) Performed: XI ROBOTIC LYSIS OF ADHESIONS, RIGHT COLECTOMY, SIGMOIDECTOMY,  ERAS PATHWAY (N/A Abdomen) RIGID PROCTOSCOPY (N/A ) COLONOSCOPY (N/A )     Patient location during evaluation: PACU Anesthesia Type: General Level of consciousness: awake and alert Pain management: pain level controlled Vital Signs Assessment: post-procedure vital signs reviewed and stable Respiratory status: spontaneous breathing, nonlabored ventilation, respiratory function stable and patient connected to nasal cannula oxygen Cardiovascular status: blood pressure returned to baseline and stable Postop Assessment: no apparent nausea or vomiting Anesthetic complications: no    Last Vitals:  Vitals:   11/28/17 1000 11/28/17 1405  BP: (!) 122/58 120/60  Pulse: 76 76  Resp: 16 17  Temp: 36.6 C 36.8 C  SpO2: 100% 100%    Last Pain:  Vitals:   11/28/17 1405  TempSrc: Oral  PainSc:                  [name] COKER

## 2017-11-29 ENCOUNTER — Other Ambulatory Visit: Payer: Self-pay

## 2017-11-29 ENCOUNTER — Inpatient Hospital Stay (HOSPITAL_COMMUNITY): Payer: PPO

## 2017-11-29 DIAGNOSIS — R0600 Dyspnea, unspecified: Secondary | ICD-10-CM

## 2017-11-29 LAB — URINALYSIS, ROUTINE W REFLEX MICROSCOPIC
Bilirubin Urine: NEGATIVE
GLUCOSE, UA: NEGATIVE mg/dL
Ketones, ur: NEGATIVE mg/dL
Leukocytes, UA: NEGATIVE
NITRITE: NEGATIVE
Protein, ur: 100 mg/dL — AB
SPECIFIC GRAVITY, URINE: 1.023 (ref 1.005–1.030)
pH: 5 (ref 5.0–8.0)

## 2017-11-29 LAB — GLUCOSE, CAPILLARY
GLUCOSE-CAPILLARY: 106 mg/dL — AB (ref 65–99)
GLUCOSE-CAPILLARY: 129 mg/dL — AB (ref 65–99)
Glucose-Capillary: 185 mg/dL — ABNORMAL HIGH (ref 65–99)
Glucose-Capillary: 165 mg/dL — ABNORMAL HIGH (ref 65–99)

## 2017-11-29 LAB — BRAIN NATRIURETIC PEPTIDE: B NATRIURETIC PEPTIDE 5: 238.2 pg/mL — AB (ref 0.0–100.0)

## 2017-11-29 LAB — D-DIMER, QUANTITATIVE: D-Dimer, Quant: 5.58 ug/mL-FEU — ABNORMAL HIGH (ref 0.00–0.50)

## 2017-11-29 LAB — HEMOGLOBIN: Hemoglobin: 6.6 g/dL — CL (ref 13.0–17.0)

## 2017-11-29 LAB — CREATININE, SERUM
CREATININE: 1.25 mg/dL — AB (ref 0.61–1.24)
GFR calc Af Amer: >60 mL/min (ref >60)
GFR calc non Af Amer: 52 mL/min — ABNORMAL LOW (ref >60)

## 2017-11-29 LAB — PROTIME-INR
INR: 1.21
Prothrombin Time: 15.2 seconds (ref 11.4–15.2)

## 2017-11-29 LAB — CK TOTAL AND CKMB (NOT AT ARMC)
CK TOTAL: 528 U/L — AB (ref 49–397)
CK, MB: 2.6 ng/mL (ref 0.5–5.0)
Relative Index: 0.5 (ref 0.0–2.5)

## 2017-11-29 LAB — PREPARE RBC (CROSSMATCH)

## 2017-11-29 LAB — POTASSIUM: Potassium: 3.9 mmol/L (ref 3.5–5.1)

## 2017-11-29 MED ORDER — IOPAMIDOL (ISOVUE-370) INJECTION 76%
100.0000 mL | Freq: Once | INTRAVENOUS | Status: AC | PRN
  Administered 2017-11-29: 100 mL via INTRAVENOUS

## 2017-11-29 MED ORDER — IOPAMIDOL (ISOVUE-370) INJECTION 76%
INTRAVENOUS | Status: AC
  Filled 2017-11-29: qty 100

## 2017-11-29 MED ORDER — SODIUM CHLORIDE 0.9 % IV SOLN
Freq: Once | INTRAVENOUS | Status: AC
  Administered 2017-11-29: 11:00:00 via INTRAVENOUS

## 2017-11-29 NOTE — Progress Notes (Addendum)
Patient's Hgb is 6.6 this am.  Notified Dr. Ninfa Linden and orders received to type and screen and transfuse 2 units PRBC.

## 2017-11-29 NOTE — Progress Notes (Signed)
3 Days Post-Op   Subjective/Chief Complaint: Pt complains of some indigestion Bowels moving but hgb 6.6 this am No tachycardia or HOTN     Objective: Vital signs in last 24 hours: Temp:  [97.8 F (36.6 C)-98.9 F (37.2 C)] 98.5 F (36.9 C) (05/18 0448) Pulse Rate:  [73-81] 81 (05/18 0448) Resp:  [15-17] 15 (05/18 0448) BP: (117-122)/(58-61) 120/61 (05/18 0448) SpO2:  [89 %-100 %] 89 % (05/18 0448) Weight:  [80.6 kg (177 lb 12.8 oz)-83.1 kg (183 lb 3.2 oz)] 83.1 kg (183 lb 3.2 oz) (05/18 0500) Last BM Date: 11/28/17(unobserved by nurse. patient flushedq)  Intake/Output from previous day: 05/17 0701 - 05/18 0700 In: 475 [P.O.:475] Out: -  Intake/Output this shift: No intake/output data recorded.  Incision/Wound:CDI soft   BS present  Mild distention no rebound     CV RRR not tachycardic   Lab Results:  Recent Labs    11/27/17 0452 11/28/17 0449 11/29/17 0415  WBC 8.5 5.4  --   HGB 8.2* 7.2* 6.6*  HCT 23.7* 20.9*  --   PLT 157 123*  --    BMET Recent Labs    11/27/17 0452 11/28/17 0449 11/29/17 0415  NA 140 143  --   K 4.1 4.1 3.9  CL 108 112*  --   CO2 23 24  --   GLUCOSE 168* 131*  --   BUN 18 16  --   CREATININE 1.62* 1.31* 1.25*  CALCIUM 7.3* 7.9*  --    PT/INR No results for input(s): LABPROT, INR in the last 72 hours. ABG No results for input(s): PHART, HCO3 in the last 72 hours.  Invalid input(s): PCO2, PO2  Studies/Results: No results found.  Anti-infectives: Anti-infectives (From admission, onward)   Start     Dose/Rate Route Frequency Ordered Stop   11/26/17 2100  cefoTEtan (CEFOTAN) 2 g in sodium chloride 0.9 % 100 mL IVPB     2 g 200 mL/hr over 30 Minutes Intravenous Every 12 hours 11/26/17 1602 11/26/17 2115   11/26/17 1309  clindamycin (CLEOCIN) 900 mg, gentamicin (GARAMYCIN) 240 mg in sodium chloride 0.9 % 1,000 mL for intraperitoneal lavage  Status:  Discontinued       As needed 11/26/17 1309 11/26/17 1420   11/26/17 0645   cefoTEtan in Dextrose 5% (CEFOTAN) IVPB 2 g     2 g Intravenous On call to O.R. 11/26/17 2595 11/26/17 0855   11/26/17 0645  clindamycin (CLEOCIN) 900 mg, gentamicin (GARAMYCIN) 240 mg in sodium chloride 0.9 % 1,000 mL for intraperitoneal lavage  Status:  Discontinued      Intraperitoneal To Surgery 11/26/17 6387 11/26/17 1542      Assessment/Plan: s/p Procedure(s): XI ROBOTIC LYSIS OF ADHESIONS, RIGHT COLECTOMY, SIGMOIDECTOMY,  ERAS PATHWAY (N/A) RIGID PROCTOSCOPY (N/A) COLONOSCOPY (N/A) HGB 6.6 transfuse 2 U   CBC after and in AM   LOS: 3 days    Calvin Burnett A Awilda Covin 11/29/2017

## 2017-11-29 NOTE — Significant Event (Signed)
Rapid Response Event Note  Overview: Time Called: 1140 Arrival Time: 1142 Event Type: Other (Comment)  Initial Focused Assessment: Called to see pt for possible blood reaction.  Pt currently sitting in chair, appears to be in resp distress, says he feels like he cannot breathe, sats are 87% on nas can 02.  Pt having shaking chills, having chest pain  and c/o severe pain in both legs, rubbing them to try to help relieve the pain. Unit of blood has been stopped, IV site aspirated of any remaining blood and then flushed with NS.  Pt is also hypertensive.   Interventions: Placed on monitor shows at fib, also put on NRM, assisted back to bed. Attempted to get  EKG but pt shivering too bad to get tracing.  Given 1 mg dilaudid IV for chest and leg pain.  Plan of Care (if not transferred):  Event Summary: Pt transferred to rm 1236 for closer monitoring.. To get CT of chest and havelads drawn.  Brother at bedside and updated.     at      at          [place], Jae Dire

## 2017-11-29 NOTE — Progress Notes (Signed)
Rapid response call after pt experience buttock pain and bradycardia / tachycardia and hypertension Now complains of chest  Pain and SOB with hypoxia  Obtaining EKG and will send labs  Transfer to  ICU and consult critical care for assistance  Needs CT of chest

## 2017-11-29 NOTE — Consult Note (Signed)
PULMONARY / CRITICAL CARE MEDICINE   Name: Calvin Burnett MRN: [id number] DOB: Sep 26, 1933    ADMISSION DATE:  11/26/2017 CONSULTATION DATE:  11/29/2017  REFERRING MD:  Dr. Brantley Stage  CHIEF COMPLAINT:  Short of breath  HISTORY OF PRESENT ILLNESS:   84 yo male with Lt colon mass.  Had robotic lysis of adhesions, Rt colectomy, sigmoidectomy 5/15.  Post op had hypotension and rectal bleeding.  Treated with IV fluid and albumin.  He had his diet advanced.  Noted to have ecchymosis around Lt flank on 5/18 and Hb 6.6.  PRBC transfusion ordered.  After starting transfusion he developed diffuse body pain, chills and shakes, tachycardia, hypertension, and dyspnea.  He was tx with lopressor, dilaudid, benadryl and transfusion stopped.  He was transferred to ICU.  Since transfer he is feeling better, and vital signs have improved.  PAST MEDICAL HISTORY :  He  has a past medical history of Diabetes mellitus, ED (erectile dysfunction), GERD (gastroesophageal reflux disease), History of basal cell carcinoma excision, History of cerebral parenchymal hemorrhage, Hypertension, and Prostate cancer ([place]) (08/12/12).  PAST SURGICAL HISTORY: He  has a past surgical history that includes Prostate biopsy (08/12/12); Appendectomy (age 50); Cholecystectomy (1980); Cataract extraction w/ intraocular lens  implant, bilateral; Radioactive seed implant (N/A, 12/03/2012); XI robotic assisted lower anterior resection (N/A, 11/26/2017); Proctoscopy (N/A, 11/26/2017); and Colonoscopy (N/A, 11/26/2017).  No Known Allergies  No current facility-administered medications on file prior to encounter.    Current Outpatient Medications on File Prior to Encounter  Medication Sig  . acetaminophen (TYLENOL) 500 MG tablet Take 500 mg by mouth daily as needed for moderate pain or headache.  Marland Kitchen apixaban (ELIQUIS) 5 MG TABS tablet Take 1 tablet (5 mg total) by mouth 2 (two) times daily. (Patient not taking: Reported on 11/25/2017)  . latanoprost  (XALATAN) 0.005 % ophthalmic solution PLACE 1 DROP INTO BOTH EYES NIGHTLY.  . MELATONIN PO Take 1 tablet by mouth at bedtime as needed (sleep).    FAMILY HISTORY:  His indicated that his mother is deceased. He indicated that his father is deceased. He indicated that the status of his sister is unknown.   SOCIAL HISTORY: He  reports that he quit smoking about 27 years ago. His smoking use included cigars. He has a 2.00 pack-year smoking history. He has never used smokeless tobacco. He reports that he does not drink alcohol or use drugs.  REVIEW OF SYSTEMS:   12 point ROS negative except above.  SUBJECTIVE:   VITAL SIGNS: BP (!) 145/57   Pulse (!) 101   Temp (!) 97.3 F (36.3 C) (Oral)   Resp 20   Ht 5\' 10"  (1.778 m)   Wt 183 lb 3.2 oz (83.1 kg)   SpO2 100%   BMI 26.29 kg/m   INTAKE / OUTPUT: I/O last 3 completed shifts: In: 1435 [P.O.:1435] Out: 6967 [Urine:1275]  PHYSICAL EXAMINATION:  General - pleasant Eyes - pupils reactive ENT - no sinus tenderness, no oral exudate, no LAN Cardiac - regular, no murmur Chest - no wheeze, rales Abd - soft, mild tenderness Ext - no edema Skin - areas of ecchymosis Neuro - normal strength Psych - normal mood   LABS:  BMET Recent Labs  Lab 11/25/17 1130 11/27/17 0452 11/28/17 0449 11/29/17 0415  NA 142 140 143  --   K 4.8 4.1 4.1 3.9  CL 107 108 112*  --   CO2 26 23 24   --   BUN 17 18 16   --  CREATININE 1.30* 1.62* 1.31* 1.25*  GLUCOSE 115* 168* 131*  --     Electrolytes Recent Labs  Lab 11/25/17 1130 11/27/17 0452 11/28/17 0449  CALCIUM 9.1 7.3* 7.9*  MG  --  1.4* 2.5*    CBC Recent Labs  Lab 11/25/17 1130 11/27/17 0452 11/28/17 0449 11/29/17 0415  WBC 6.7 8.5 5.4  --   HGB 15.0 8.2* 7.2* 6.6*  HCT 43.8 23.7* 20.9*  --   PLT 194 157 123*  --     Coag's Recent Labs  Lab 11/29/17 1217  INR 1.21    Sepsis Markers No results for input(s): LATICACIDVEN, PROCALCITON, O2SATVEN in the last 168  hours.  ABG No results for input(s): PHART, PCO2ART, PO2ART in the last 168 hours.  Liver Enzymes No results for input(s): AST, ALT, ALKPHOS, BILITOT, ALBUMIN in the last 168 hours.  Cardiac Enzymes No results for input(s): TROPONINI, PROBNP in the last 168 hours.  Glucose Recent Labs  Lab 11/28/17 0746 11/28/17 1222 11/28/17 1600 11/28/17 2036 11/29/17 0753 11/29/17 1145  GLUCAP 147* 109* 145* 117* 106* 185*    Imaging No results found.   STUDIES:   CULTURES:  ANTIBIOTICS:  SIGNIFICANT EVENTS: 5/15 Colectomy 5/18 Transfer to ICU after presumed PRBC transfusion reaction  LINES/TUBES:  DISCUSSION: 84 yo male with colon cancer s/p colectomy.  Post op course complicated by rectal bleeding, anemia.  Transferred to ICU 5/18 after getting possible transfusion reaction.  ASSESSMENT / PLAN:  Possible transfusion reaction. - tests sent to blood bank  Acute blood loss anemia. - f/u Hb - if he needs transfusion, then might need to pretreat with steroids/benadryl  Colon cancer s/p colectomy with post op rectal bleeding. - f/u CT imaging  Hx of A fib on eliquis as outpt. - hold anticoagulation for now  DM type II with neuropathy. - SSI  DVT prophylaxis - SCDs SUP - not indicated Nutrition - heart healthy diet Goals of care - full code   Updated family at bedside  Chesley Mires, MD [facility] 11/29/2017, 1:13 PM

## 2017-11-29 NOTE — Progress Notes (Signed)
Large echymotic area on left flank 10 cm X 10 cm.  Denies pain at the site.  Sites is mainly redish purple. No edema noted.   Dr. Sherlene Shams ok'd that no Lovenox for patient tonight.

## 2017-11-29 NOTE — Plan of Care (Signed)
  Problem: Pain Managment: Goal: General experience of comfort will improve Outcome: Progressing   

## 2017-11-30 DIAGNOSIS — T8092XA Unspecified transfusion reaction, initial encounter: Secondary | ICD-10-CM

## 2017-11-30 LAB — GLUCOSE, CAPILLARY
GLUCOSE-CAPILLARY: 132 mg/dL — AB (ref 65–99)
GLUCOSE-CAPILLARY: 158 mg/dL — AB (ref 65–99)
Glucose-Capillary: 115 mg/dL — ABNORMAL HIGH (ref 65–99)
Glucose-Capillary: 154 mg/dL — ABNORMAL HIGH (ref 65–99)

## 2017-11-30 LAB — COMPREHENSIVE METABOLIC PANEL
ALK PHOS: 46 U/L (ref 38–126)
ALT: 33 U/L (ref 17–63)
AST: 62 U/L — AB (ref 15–41)
Albumin: 3 g/dL — ABNORMAL LOW (ref 3.5–5.0)
Anion gap: 9 (ref 5–15)
BILIRUBIN TOTAL: 0.9 mg/dL (ref 0.3–1.2)
BUN: 20 mg/dL (ref 6–20)
CALCIUM: 8.3 mg/dL — AB (ref 8.9–10.3)
CHLORIDE: 108 mmol/L (ref 101–111)
CO2: 22 mmol/L (ref 22–32)
CREATININE: 1.4 mg/dL — AB (ref 0.61–1.24)
GFR calc Af Amer: 52 mL/min — ABNORMAL LOW (ref >60)
GFR, EST NON AFRICAN AMERICAN: 45 mL/min — AB (ref >60)
Glucose, Bld: 122 mg/dL — ABNORMAL HIGH (ref 65–99)
Potassium: 4.1 mmol/L (ref 3.5–5.1)
Sodium: 139 mmol/L (ref 135–145)
TOTAL PROTEIN: 5.6 g/dL — AB (ref 6.5–8.1)

## 2017-11-30 NOTE — Progress Notes (Signed)
4 Days Post-Op   Subjective/Chief Complaint: No further issues after transfer Denies chest pain, SOB   Objective: Vital signs in last 24 hours: Temp:  [97.3 F (36.3 C)-101.2 F (38.4 C)] 97.8 F (36.6 C) (05/19 0435) Pulse Rate:  [33-177] 76 (05/19 0500) Resp:  [11-22] 18 (05/19 0500) BP: (95-181)/(55-155) 98/72 (05/19 0500) SpO2:  [95 %-100 %] 96 % (05/19 0500) Weight:  [78.5 kg (173 lb 1 oz)] 78.5 kg (173 lb 1 oz) (05/19 0500) Last BM Date: 11/29/17  Intake/Output from previous day: 05/18 0701 - 05/19 0700 In: 410 [P.O.:300; Blood:110] Out: 800 [Urine:800] Intake/Output this shift: No intake/output data recorded.  Exam: Awake and alert Lungs clear Abdomen soft, ND  Lab Results:  Recent Labs    11/28/17 0449 11/29/17 0415 11/29/17 1217  WBC 5.4  --  7.0  HGB 7.2* 6.6* 8.3*  HCT 20.9*  --  25.4*  PLT 123*  --  151   BMET Recent Labs    11/28/17 0449 11/29/17 0415 11/30/17 0340  NA 143  --  139  K 4.1 3.9 4.1  CL 112*  --  108  CO2 24  --  22  GLUCOSE 131*  --  122*  BUN 16  --  20  CREATININE 1.31* 1.25* 1.40*  CALCIUM 7.9*  --  8.3*   PT/INR Recent Labs    11/29/17 1217  LABPROT 15.2  INR 1.21   ABG No results for input(s): PHART, HCO3 in the last 72 hours.  Invalid input(s): PCO2, PO2  Studies/Results: Ct Angio Chest Pe W Or Wo Contrast  Result Date: 11/29/2017 CLINICAL DATA:  Short of breath EXAM: CT ANGIOGRAPHY CHEST WITH CONTRAST TECHNIQUE: Multidetector CT imaging of the chest was performed using the standard protocol during bolus administration of intravenous contrast. Multiplanar CT image reconstructions and MIPs were obtained to evaluate the vascular anatomy. CONTRAST:  120mL ISOVUE-370 IOPAMIDOL (ISOVUE-370) INJECTION 76% COMPARISON:  CT chest 06/10/2017 FINDINGS: Cardiovascular: Negative for pulmonary embolism. Mild atherosclerotic calcification aortic arch without aneurysm or dissection. Minimal coronary calcification. Heart size  normal. No pericardial effusion Mediastinum/Nodes: Negative for mass or adenopathy Lungs/Pleura: Small bilateral pleural effusions right greater than left with mild bibasilar atelectasis. Negative for pneumonia or mass. Upper Abdomen: Surgical clips right upper quadrant consistent with cholecystectomy. No acute abnormality in the abdomen Musculoskeletal: Negative Review of the MIP images confirms the above findings. IMPRESSION: Negative for pulmonary embolism. Small bilateral effusions and mild bibasilar atelectasis Aortic Atherosclerosis (ICD10-I70.0). Electronically Signed   By: Franchot Gallo M.D.   On: 11/29/2017 14:11   Ct Abdomen Pelvis W Contrast  Result Date: 11/29/2017 CLINICAL DATA:  Gastrointestinal bleeding. Patient status post RIGHT colectomy sigmoidectomy. EXAM: CT ABDOMEN AND PELVIS WITH CONTRAST TECHNIQUE: Multidetector CT imaging of the abdomen and pelvis was performed using the standard protocol following bolus administration of intravenous contrast. CONTRAST:  122mL ISOVUE-370 IOPAMIDOL (ISOVUE-370) INJECTION 76% COMPARISON:  CT 06/10/2017 FINDINGS: Lower chest: Bilateral small effusions. Minimal atelectasis. No airspace disease no pericardial effusion Hepatobiliary: No focal hepatic lesion. Postcholecystectomy. No biliary dilatation. Several scattered low-density lesions within the liver unchanged from prior likely representing small hepatic cysts. Pancreas: Pancreas is normal. No ductal dilatation. No pancreatic inflammation. Spleen: Normal spleen Adrenals/urinary tract: Adrenal glands and kidneys are normal. The ureters and bladder normal. Stomach/Bowel: Stomach, duodenum small-bowel normal. Post RIGHT hemicolectomy. No complication at the enteric colonic anastomosis at the hepatic flexure. Transverse colon descending colon normal. Colonic colonic anastomosis at the level the proximal sigmoid colon without  complicating features. Rectum normal. Vascular/Lymphatic: Abdominal aorta is normal  caliber with atherosclerotic calcification. There is no retroperitoneal or periportal lymphadenopathy. No pelvic lymphadenopathy. Reproductive: Brachytherapy seeds within the prostate gland. Gas dissects along the nondependent margin of the LEFT and RIGHT inguinal canal/spermatic cords (image 90/4). Presumed related to insufflation of the abdominal cavity during laparoscopic/robotic surgery. Other: Small volume gas in the subcutaneous abdominal wall midline below the umbilicus. No intraperitoneal free fluid. No abscess Musculoskeletal: No aggressive osseous lesion. IMPRESSION: 1. No complications following RIGHT hemicolectomy and partial sigmoidectomy. No evidence of hematoma or active bleeding. 2. No abscess or significant free fluid. 3. Small volume gas in the ventral abdominal wall and dissecting along the inguinal canals related to recent surgery. 4. Small bilateral pleural effusions Electronically Signed   By: Suzy Bouchard M.D.   On: 11/29/2017 14:19    Anti-infectives: Anti-infectives (From admission, onward)   Start     Dose/Rate Route Frequency Ordered Stop   11/26/17 2100  cefoTEtan (CEFOTAN) 2 g in sodium chloride 0.9 % 100 mL IVPB     2 g 200 mL/hr over 30 Minutes Intravenous Every 12 hours 11/26/17 1602 11/26/17 2115   11/26/17 1309  clindamycin (CLEOCIN) 900 mg, gentamicin (GARAMYCIN) 240 mg in sodium chloride 0.9 % 1,000 mL for intraperitoneal lavage  Status:  Discontinued       As needed 11/26/17 1309 11/26/17 1420   11/26/17 0645  cefoTEtan in Dextrose 5% (CEFOTAN) IVPB 2 g     2 g Intravenous On call to O.R. 11/26/17 8768 11/26/17 0855   11/26/17 0645  clindamycin (CLEOCIN) 900 mg, gentamicin (GARAMYCIN) 240 mg in sodium chloride 0.9 % 1,000 mL for intraperitoneal lavage  Status:  Discontinued      Intraperitoneal To Surgery 11/26/17 1157 11/26/17 1542      Assessment/Plan: s/p Procedure(s): XI ROBOTIC LYSIS OF ADHESIONS, RIGHT COLECTOMY, SIGMOIDECTOMY,  ERAS PATHWAY  (N/A) RIGID PROCTOSCOPY (N/A) COLONOSCOPY (N/A)  Moved to unit for suspected transfusion reaction  Improved now and feels well Has remained stable Will transfer back to the floor  LOS: 4 days    Alexee Delsanto A 11/30/2017

## 2017-11-30 NOTE — Progress Notes (Signed)
Hemodynamics improved.  Denies chest pain, dyspnea, abdominal pain.  Hb 8.3.  CT chest/abd/pelvis reviewed and unremarkable.  PCCM will sign off.  Please call if additional help needed.  Updated family at bedside.  Chesley Mires, MD [facility] Pulmonary/Critical Care 11/30/2017, 11:41 AM

## 2017-11-30 NOTE — Progress Notes (Signed)
Transferred to  Room 1313, report given to RN.

## 2017-11-30 NOTE — Plan of Care (Signed)
  Problem: Clinical Measurements: Goal: Will remain free from infection Outcome: Progressing   Problem: Pain Managment: Goal: General experience of comfort will improve Outcome: Progressing   

## 2017-12-01 LAB — CBC
HCT: 25.4 % — ABNORMAL LOW (ref 39.0–52.0)
HCT: 21.2 % — ABNORMAL LOW (ref 39.0–52.0)
Hemoglobin: 8.3 g/dL — ABNORMAL LOW (ref 13.0–17.0)
Hemoglobin: 7.2 g/dL — ABNORMAL LOW (ref 13.0–17.0)
MCH: 30.3 pg (ref 26.0–34.0)
MCH: 30.6 pg (ref 26.0–34.0)
MCHC: 32.7 g/dL (ref 30.0–36.0)
MCHC: 34 g/dL (ref 30.0–36.0)
MCV: 92.7 fL (ref 78.0–100.0)
MCV: 90.2 fL (ref 78.0–100.0)
PLATELETS: 151 10*3/uL (ref 150–400)
PLATELETS: 195 10*3/uL (ref 150–400)
RBC: 2.74 MIL/uL — AB (ref 4.22–5.81)
RBC: 2.35 MIL/uL — ABNORMAL LOW (ref 4.22–5.81)
RDW: 14 % (ref 11.5–15.5)
RDW: 14 % (ref 11.5–15.5)
WBC: 7 10*3/uL (ref 4.0–10.5)
WBC: 5.3 10*3/uL (ref 4.0–10.5)

## 2017-12-01 LAB — BASIC METABOLIC PANEL
Anion gap: 11 (ref 5–15)
BUN: 16 mg/dL (ref 6–20)
CALCIUM: 8.2 mg/dL — AB (ref 8.9–10.3)
CO2: 20 mmol/L — AB (ref 22–32)
CREATININE: 1.23 mg/dL (ref 0.61–1.24)
Chloride: 109 mmol/L (ref 101–111)
GFR calc non Af Amer: 53 mL/min — ABNORMAL LOW (ref >60)
GLUCOSE: 117 mg/dL — AB (ref 65–99)
Potassium: 4 mmol/L (ref 3.5–5.1)
Sodium: 140 mmol/L (ref 135–145)

## 2017-12-01 LAB — GLUCOSE, CAPILLARY
GLUCOSE-CAPILLARY: 121 mg/dL — AB (ref 65–99)
Glucose-Capillary: 137 mg/dL — ABNORMAL HIGH (ref 65–99)
Glucose-Capillary: 103 mg/dL — ABNORMAL HIGH (ref 65–99)
Glucose-Capillary: 135 mg/dL — ABNORMAL HIGH (ref 65–99)

## 2017-12-01 MED ORDER — METOPROLOL TARTRATE 5 MG/5ML IV SOLN: 5.0000 mg | Freq: Four times a day (QID) | INTRAVENOUS | Status: DC | PRN

## 2017-12-01 MED ORDER — LACTATED RINGERS IV BOLUS
1000.0000 mL | Freq: Three times a day (TID) | INTRAVENOUS | Status: DC | PRN
Start: 2017-12-01 — End: 2017-12-02

## 2017-12-01 MED ORDER — HYDRALAZINE HCL 20 MG/ML IJ SOLN: 10.0000 mg | INTRAMUSCULAR | Status: DC | PRN

## 2017-12-01 MED ORDER — FUROSEMIDE 40 MG PO TABS
40.0000 mg | ORAL_TABLET | Freq: Once | ORAL | Status: AC
  Administered 2017-12-01: 40 mg via ORAL
  Filled 2017-12-01: qty 1

## 2017-12-01 NOTE — Evaluation (Signed)
Occupational Therapy Evaluation Patient Details Name: Calvin Burnett MRN: [id number] DOB: 07-28-1933 Today's Date: 12/01/2017    History of Present Illness Pt is an 84 year old male with hx of prostate cancer, DM, HTN, dementia and admitted for Polyp of hepatic flexure s/p robotic right colectomy 11/26/2017   Clinical Impression   Met pt sitting up in recliner upon OT arrival. Temporal disorientation noted upon arrival, pt asking if it is bed time- reoriented to time. Pt completed sit <>stand using 2WW with min guard and cues for hand placement. Pt completed functional mobility to BR with min guard assist and 2WW, used urinal standing with supervision assist and VC's to use grab bars for UE steady assist (noticed swaying when standing without UE assist). HR 88 and O2 sats 96 after functional mobility, improved with rest and deep breaths. During ROM/MMT pt showed difficulty mirroring motor commands from OT without verbal/tactile cues to replicate movements. Pt is poor historian with recalling family and equipment at home.    Follow Up Recommendations  No OT follow up    Equipment Recommendations       Recommendations for Other Services       Precautions / Restrictions Precautions Precautions: Fall Restrictions Weight Bearing Restrictions: No      Mobility Bed Mobility               General bed mobility comments: pt in recliner upon arrival  Transfers Overall transfer level: Needs assistance Equipment used: Rolling walker (2 wheeled) Transfers: Sit to/from Stand Sit to Stand: Min guard         General transfer comment: BUE assist needed for safety    Balance Overall balance assessment: Needs assistance         Standing balance support: Bilateral upper extremity supported Standing balance-Leahy Scale: Poor Standing balance comment: UE support needed                           ADL either performed or assessed with clinical judgement   ADL Overall  ADL's : Needs assistance/impaired Eating/Feeding: Set up   Grooming: Set up;Supervision/safety   Upper Body Bathing: Min guard   Lower Body Bathing: Min guard   Upper Body Dressing : Set up   Lower Body Dressing: Min guard   Toilet Transfer: Copy Details (indicate cue type and reason): pt used urinal, standing with no BUE support, needing cues to use walker nearby for safety Toileting- Clothing Manipulation and Hygiene: Supervision/safety   Tub/ Banker: Min guard   Functional mobility during ADLs: Min guard General ADL Comments: pt physically able to complete ADLs with min guard, baseline cognitive impairments on memory     Vision Baseline Vision/History: Wears glasses Wears Glasses: At all times Patient Visual Report: No change from baseline(per pt report)       Perception     Praxis      Pertinent Vitals/Pain Pain Assessment: No/denies pain     Hand Dominance     Extremity/Trunk Assessment Upper Extremity Assessment Upper Extremity Assessment: Generalized weakness(mostly in deltoids)   Lower Extremity Assessment Lower Extremity Assessment: Generalized weakness       Communication Communication Communication: HOH   Cognition Arousal/Alertness: Awake/alert Behavior During Therapy: WFL for tasks assessed/performed Overall Cognitive Status: History of cognitive impairments - at baseline  General Comments: pt with history of AD, pleasant and appropriate, difficulty following mirrored ROM activities without verbal and tactile cues   General Comments       Exercises Other Exercises Other Exercises: education given on use of icentive spirometer to increase respiratory health to increase independence and activity tolerance and engagement in BADL    Shoulder Instructions      Home Living Family/patient expects to be discharged to:: Private residence Living Arrangements:  Alone Available Help at Discharge: Family;Available PRN/intermittently(daughter in [place], son 10 miles away per pt report) Type of Home: House Home Access: Stairs to enter [employer] of Steps: 1 Entrance Stairs-Rails: None Home Layout: One level     Bathroom Shower/Tub: Tub/shower unit;Walk-in shower;Other (comment)(pt has both, no seat)   Bathroom Toilet: Standard     Home Equipment: Walker - 2 wheels;Cane - single point   Additional Comments: pt reports no DME, poor historian      Prior Functioning/Environment Level of Independence: Independent        Comments: pt reports not using DME, poor historian        OT Problem List: Decreased strength;Impaired balance (sitting and/or standing);Decreased cognition;Decreased safety awareness;Decreased activity tolerance;Decreased knowledge of use of DME or AE      OT Treatment/Interventions: Self-care/ADL training;DME and/or AE instruction;Balance training    OT Goals(Current goals can be found in the care plan section) Acute Rehab OT Goals Patient Stated Goal: "I want to be sure I can do the things I need to do when I go home" OT Goal Formulation: With patient Time For Goal Achievement: 12/22/17 Potential to Achieve Goals: Good  OT Frequency: Min 2X/week   Barriers to D/C:            Co-evaluation              AM-PAC PT "6 Clicks" Daily Activity     Outcome Measure Help from another person eating meals?: A Little Help from another person taking care of personal grooming?: A Little Help from another person toileting, which includes using toliet, bedpan, or urinal?: A Little Help from another person bathing (including washing, rinsing, drying)?: A Little Help from another person to put on and taking off regular upper body clothing?: A Little Help from another person to put on and taking off regular lower body clothing?: A Little 6 Click Score: 18   End of Session Equipment Utilized During  Treatment: Gait belt;Rolling walker Nurse Communication: Other (comment)(urine output)  Activity Tolerance: Patient tolerated treatment well Patient left: in bed;with call bell/phone within reach;with chair alarm set  OT Visit Diagnosis: Muscle weakness (generalized) (M62.81);Other symptoms and signs involving cognitive function;Unsteadiness on feet (R26.81)                Time: 0626-9485 OT Time Calculation (min): 26 min Charges:  OT General Charges $OT Visit: 1 Visit OT Evaluation $OT Eval Low Complexity: 1 Low OT Treatments $Self Care/Home Management : 8-22 mins G-Codes:     Zenovia Jarred, MSOT, OTR/L   [place] 12/01/2017, 4:26 PM

## 2017-12-01 NOTE — Progress Notes (Signed)
[facility]  [address] [place], [place] [address] Phone: [phone]  FAX: [place] [phone] 10/28/1933  CARE TEAM:  PCP: Marin Olp, MD  Outpatient Care Team: Patient Care Team: Marin Olp, MD as PCP - General (Family Medicine) Michael Boston, MD as Consulting Physician (General Surgery) Ladene Artist, MD as Consulting Physician (Gastroenterology) Stanford Breed Denice Bors, MD as Consulting Physician (Cardiology)  Inpatient Treatment Team: Treatment Team: Attending Provider: Michael Boston, MD; Technician: Leda Quail, NT; Technician: Reuel Derby, NT; Registered Nurse: Candi Leash, RN; Technician: Resa Miner, NT   Problem List:   Principal Problem:   Polyp of hepatic flexure s/p robotic right colectomy 11/26/2017 Active Problems:   Alzheimer's dementia   Essential hypertension   GERD   Diabetes mellitus type II, controlled ([place])   CKD (chronic kidney disease), stage III ([place])   Paroxysmal atrial fibrillation ([place])   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  -Try to hold off on transfusion since rxn over weekend  Solid diet.  Pathology noted - copy given to patient  Hold anticoagulation today.  If hemoglobin stable for 48 hours and blood pressure good, can restart.  -hypoMag - replaced.  Follow  -VTE prophylaxis- SCDs,  etc  -Mild dementia stable - follow.  fanily & RNs involved  -mobilize as tolerated to help recovery.  PT/OT consults  20 minutes spent in review, evaluation, examination, counseling, and coordination of care.  More than 50% of that time was spent in counseling.  [name], M.D., F.A.C.S. Gastrointestinal and Minimally Invasive Surgery [facility] [address] [place], [place] [address] [phone] Main / Paging   12/01/2017    Subjective: (Chief complaint)  Transferred to floor [place] PO Denies pain RN in room  Objective:  Vital signs:  Vitals:   11/30/17 0800 11/30/17 1338 11/30/17 2005 12/01/17 0558  BP: 121/71 104/65 105/74 131/87  Pulse: 62 76 70 78  Resp: _0 Temp: 98 F (36.7 C) 97.8 F (36.6 C) (!) 97.4 F (36.3 C) 97.8 F (36.6 C)  TempSrc: Oral Oral Oral   SpO2: 96% 99% 99% 99%  Weight:    75.8 kg (167 lb 1.7 oz)  Height:        Last BM Date: 11/30/17  Intake/Output   Yesterday:  05/19 0701 - 05/20 0700 In: 1560 [P.O.:1560] Out: 1000 [Urine:1000] This shift:  No intake/output data recorded.  Bowel function:  Flatus: YES  BM:  No  Drain: (No drain)   Physical Exam:  General: Pt awake/alert/oriented x4 in no acute distress Eyes: PERRL, normal EOM.  Sclera clear.  No icterus Neuro: CN II-XII intact w/o focal sensory/motor deficits. Lymph: No head/neck/groin lymphadenopathy Psych:  No delerium/psychosis/paranoia HENT: Normocephalic, Mucus membranes moist.  No thrush Neck: Supple, No tracheal deviation Chest: No chest wall pain w good excursion CV:  Pulses intact.  Regular rhythm MS: Normal AROM mjr joints.  No obvious deformity  Abdomen: Soft.  Mildy distended.  Nontender.  Dressings removed - no active bleeding.  R flank eccymosis.  No evidence of peritonitis.  No incarcerated hernias.  Ext:  No deformity.  No mjr edema.  No cyanosis Skin: No petechiae / purpura  Results:    Patient: Calvin Burnett, Calvin Burnett Collected: 11/26/2017 Client: [facility] Accession: [id number] Received: 11/26/2017 Michael Boston, MD DOB: 1933/09/10 Age: 84 Gender: M Reported: 11/28/2017 [place] Patient Ph: [phone] MRN #: [id number] [place], [place] [address] Visit #: [id number].[place] Chart #: Phone: [phone] Fax: CC: REPORT OF SURGICAL PATHOLOGY FINAL DIAGNOSIS Diagnosis 1. Colon, segmental resection, sigmoid, open end proximal - STRICTURE WITH DIVERTICULOSIS. - ONE BENIGN LYMPH NODE (0/1). - NO EVIDENCE OF MALIGNANCY. 2. Colon, segmental resection for tumor, ascending - INVASIVE COLORECTAL ADENOCARCINOMA, 1 CM ARISING IN A SESSILE TUBULAR ADENOMA. - TUMOR EXTENDS INTO SUBMUCOSA. - MARGINS NOT INVOLVED. - NINETEEN BENIGN LYMPH NODES (0/19). - SEPARATE CECAL TUBULAR ADENOMA. 3. Colon, resection margin (donut), distal anastomotic ring - BENIGN COLON. - NO EVIDENCE OF MALIGNANCY. Microscopic Comment 2. COLON AND RECTUM: Resection, Including Transanal Disk Excision of Rectal Neoplasms Procedure: Right hemicolectomy, sigmoid stricture resection and distal margin anastomotic ring. Tumor Site: Transverse. Tumor Size: 1 cm arising in a 1.4 cm sessile tubular adenoma. Macroscopic Tumor Perforation: No. Histologic Type: Colorectal adenocarcinoma. Histologic Grade: Well differentiated, grade I. Tumor Extension: Into submucosa. Margins: Free of tumor. Treatment Effect: No. Lymphovascular Invasion: No. Perineural Invasion: No. Tumor Deposits: No. Regional Lymph Nodes: 19 lymph nodes submitted or found. Number of Lymph Nodes Involved: 0. 1 of 3 FINAL for Calvin Burnett, Calvin Burnett [phone]) Microscopic Comment(continued) Number of Lymph Nodes Examined: 19. Pathologic Stage Classification (pTNM, AJCC 8th Edition): pT1, pN0. Ancillary Studies: Can be performed if requested (MMR / MSI testing: ) Representative tumor block: 2E and 16F. Comments: There is a sessile  1.4 cm tubular adenoma which has a 1.0 cm focus of invasive carcinoma which extends into the submucosa and does not involve the muscularis propria. There is a separate 0.6 cm in greatest dimension tubular adenoma within the cecum. Called to Dr. Johney Maine on 11/28/17. ([id number]) Claudette Laws MD Pathologist, Electronic Signature (Case signed 11/28/2017) Specimen Matty Vanroekel and Clinical Information Specimen(s) Obtained: 1. Colon, segmental resection, sigmoid, open end proximal 2. Colon, segmental resection for tumor, ascending 3. Colon, resection margin (donut), distal anastomotic ring Specimen Clinical Information 1. sigmoid stricture, polyp of hepatic flexure of colon [rd] Jandel Patriarca 1. Specimen: Sigmoid colon. Specimen integrity: The proximal end is received open per requistion sheet. Length: 14 x 7 cm. Serosa: Tan-pink, smooth, focal area of indurated fat wrapping of the serosal surface in the area of a possible stricture. Contents: There is minimal tan green soft contents. Mucosa/Wall: The bowel is strictured at the proximal-third of the specimen, and there is prominent redundant mucosal folding and thickened wall up to 0.6 cm in this area. There is a 1.5 x 1.2 x 1 cm non perforated diverticula present at the stricture. There is an additional 0.4 cm possible polyp at the distal third of the specimen grossly limited to the mucosa. Lymph nodes: A single possible 0.5 cm lymph node is sampled from the soft tissue. Block Summary: A= proximal margin en face. B= distal margin en face. C= stricture with diverticulum. D= possible polyp with one lymph node whole. 2. Specimen: Terminal ileum, cecum, ascending colon, transverse colon. Specimen integrity: Intact. Specimen length: Terminal ileum is 6  cm, the cecum is 6 x 5 x 2.5 cm, and the ascending colon is 8.5 cm the portion of transverse colon is 14 cm. Appendix is grossly absent. Mesorectal intactness: N/A Tumor location: There is a single sessile  polyp in the transverse colon, proximal to an area of tattoing. Tumor size: 1.4 x 1.3 cm. Percent of bowel circumference involved: 10-20% 2 of 3 FINAL for Calvin Burnett, Calvin Burnett [phone]) Ramere Downs(continued) Tumor distance to margins: Proximal: 27 cm Distal: 5.5 cm Mesenteric (sigmoid and transverse): 7 cm Radial (posterior ascending, posterior descending; lateral and posterior mid-rectum; and entire lower 1/3 rectum): Greater than 10 cm. Macroscopic extent of tumor invasion: Grossly limited to the mucosa. Total presumed lymph nodes: Twenty-one lymph nodes are identified ranging from 0.2 up to 1 cm with tan pink and brown black discolored cut surfaces. Extramural satellite tumor nodules: None Mucosal polyp(s): There is an additional 0.6 cm possible polyp in the cecum, grossly limited to mucosa. Additional findings: None. Block summary: A= proximal margin en face. B= distal margin en face. C-G= transverse colon lesion entirely. H= cecal polyp entirely. I= ileocecal valve. J= four lymph nodes whole. K= four lymph nodes whole. L= four lymph nodes whole. M= four lymph nodes whole. N= five lymph nodes whole. 3. Received in formalin is a 0.8 cm in length x 2 cm in diameter donut shape portion of bowel, received with one end stapled. The stapled surface en face in block 3A. (AK:gt, 11/27/17) Report signed out from the following location(s) Technical component and interpretation was performed at [facility] [place], [place], [place] [address]. CLIA #: [id number],             Labs: Results for orders placed or performed during the hospital encounter of 11/26/17 (from the past 48 hour(s))  Glucose, capillary     Status: Abnormal   Collection Time: 11/29/17 11:45 AM  Result Value Ref Range   Glucose-Capillary 185 (H) 65 - 99 mg/dL  CBC     Status: Abnormal   Collection Time: 11/29/17 12:17 PM  Result Value Ref Range   WBC 7.0 4.0 - 10.5 K/uL   RBC 2.74 (L)  4.22 - 5.81 MIL/uL   Hemoglobin 8.3 (L) 13.0 - 17.0 g/dL    Comment: POST TRANSFUSION SPECIMEN DELTA CHECK NOTED    HCT 25.4 (L) 39.0 - 52.0 %   MCV 92.7 78.0 - 100.0 fL   MCH 30.3 26.0 - 34.0 pg   MCHC 32.7 30.0 - 36.0 g/dL   RDW 14.0 11.5 - 15.5 %   Platelets 151 150 - 400 K/uL    Comment: Performed at [facility], [place] [address] [place], [place] [address]  D-dimer, quantitative (not at [facility])     Status: Abnormal   Collection Time: 11/29/17 12:17 PM  Result Value Ref Range   D-Dimer, Quant 5.58 (H) 0.00 - 0.50 ug/mL-FEU    Comment: (NOTE) At the manufacturer cut-off of 0.50 ug/mL FEU, this assay has been documented to exclude PE with a sensitivity and negative predictive value of 97 to 99%.  At this time, this assay has not been approved by the FDA to exclude DVT/VTE. Results should be correlated with clinical presentation. Performed at [facility], [place] [address], [place], [place] [address]   Protime-INR     Status: None   Collection Time: 11/29/17 12:17 PM  Result Value Ref Range   Prothrombin Time 15.2 11.4 - 15.2 seconds   INR 1.21  Comment: Performed at [facility], [place] [address], [place], [place] [address]  CK total and CKMB (cardiac)not at [facility]     Status: Abnormal   Collection Time: 11/29/17 12:17 PM  Result Value Ref Range   Total CK 528 (H) 49 - 397 U/L   CK, MB 2.6 0.5 - 5.0 ng/mL   Relative Index 0.5 0.0 - 2.5    Comment: Performed at [place] [address] [place], [place] [address]  Brain natriuretic peptide     Status: Abnormal   Collection Time: 11/29/17 12:17 PM  Result Value Ref Range   B Natriuretic Peptide 238.2 (H) 0.0 - 100.0 pg/mL    Comment: Performed at [facility], [place] [address], [place], [place] [address]  Transfusion reaction     Status: None (Preliminary result)   Collection Time: 11/29/17  1:25 PM  Result Value Ref Range   Post RXN DAT IgG  NEG    DAT C3      NEG Performed at [facility], [place] [address], [place], [place] [address]    Path interp tx rxn PENDING   Urinalysis, Routine w reflex microscopic     Status: Abnormal   Collection Time: 11/29/17  2:16 PM  Result Value Ref Range   Color, Urine YELLOW YELLOW   APPearance CLEAR CLEAR   Specific Gravity, Urine 1.023 1.005 - 1.030   pH 5.0 5.0 - 8.0   Glucose, UA NEGATIVE NEGATIVE mg/dL   Hgb urine dipstick MODERATE (A) NEGATIVE   Bilirubin Urine NEGATIVE NEGATIVE   Ketones, ur NEGATIVE NEGATIVE mg/dL   Protein, ur 100 (A) NEGATIVE mg/dL   Nitrite NEGATIVE NEGATIVE   Leukocytes, UA NEGATIVE NEGATIVE   RBC / HPF 11-20 0 - 5 RBC/hpf   WBC, UA 0-5 0 - 5 WBC/hpf   Bacteria, UA RARE (A) NONE SEEN   Mucus PRESENT    Sperm, UA PRESENT     Comment: Performed at [facility], [place] [address] [place], [place] [address]  Glucose, capillary     Status: Abnormal   Collection Time: 11/29/17  3:59 PM  Result Value Ref Range   Glucose-Capillary 165 (H) 65 - 99 mg/dL   Comment 1 Notify RN    Comment 2 Document in Chart   Glucose, capillary     Status: Abnormal   Collection Time: 11/29/17  9:15 PM  Result Value Ref Range   Glucose-Capillary 129 (H) 65 - 99 mg/dL   Comment 1 Notify RN    Comment 2 Document in Chart   Comprehensive metabolic panel     Status: Abnormal   Collection Time: 11/30/17  3:40 AM  Result Value Ref Range   Sodium 139 135 - 145 mmol/L   Potassium 4.1 3.5 - 5.1 mmol/L   Chloride 108 101 - 111 mmol/L   CO2 22 22 - 32 mmol/L   Glucose, Bld 122 (H) 65 - 99 mg/dL   BUN 20 6 - 20 mg/dL   Creatinine, Ser 1.40 (H) 0.61 - 1.24 mg/dL   Calcium 8.3 (L) 8.9 - 10.3 mg/dL   Total Protein 5.6 (L) 6.5 - 8.1 g/dL   Albumin 3.0 (L) 3.5 - 5.0 g/dL   AST 62 (H) 15 - 41 U/L   ALT 33 17 - 63 U/L   Alkaline Phosphatase 46 38 - 126 U/L   Total Bilirubin 0.9 0.3 - 1.2 mg/dL   GFR calc non Af Amer 45 (L) >  60 mL/min   GFR calc Af  Amer 52 (L) >60 mL/min    Comment: (NOTE) The eGFR has been calculated using the CKD EPI equation. This calculation has not been validated in all clinical situations. eGFR's persistently <60 mL/min signify possible Chronic Kidney Disease.    Anion gap 9 5 - 15    Comment: Performed at [facility], [place] [address] [place], [place] [address]  Glucose, capillary     Status: Abnormal   Collection Time: 11/30/17  7:47 AM  Result Value Ref Range   Glucose-Capillary 115 (H) 65 - 99 mg/dL   Comment 1 Notify RN    Comment 2 Document in Chart   Glucose, capillary     Status: Abnormal   Collection Time: 11/30/17 11:40 AM  Result Value Ref Range   Glucose-Capillary 154 (H) 65 - 99 mg/dL  Glucose, capillary     Status: Abnormal   Collection Time: 11/30/17  4:51 PM  Result Value Ref Range   Glucose-Capillary 132 (H) 65 - 99 mg/dL  Glucose, capillary     Status: Abnormal   Collection Time: 11/30/17  9:21 PM  Result Value Ref Range   Glucose-Capillary 158 (H) 65 - 99 mg/dL  Basic metabolic panel     Status: Abnormal   Collection Time: 12/01/17  5:40 AM  Result Value Ref Range   Sodium 140 135 - 145 mmol/L   Potassium 4.0 3.5 - 5.1 mmol/L   Chloride 109 101 - 111 mmol/L   CO2 20 (L) 22 - 32 mmol/L   Glucose, Bld 117 (H) 65 - 99 mg/dL   BUN 16 6 - 20 mg/dL   Creatinine, Ser 1.23 0.61 - 1.24 mg/dL   Calcium 8.2 (L) 8.9 - 10.3 mg/dL   GFR calc non Af Amer 53 (L) >60 mL/min   GFR calc Af Amer >60 >60 mL/min    Comment: (NOTE) The eGFR has been calculated using the CKD EPI equation. This calculation has not been validated in all clinical situations. eGFR's persistently <60 mL/min signify possible Chronic Kidney Disease.    Anion gap 11 5 - 15    Comment: Performed at [facility], [place] [address], [place], [place] [address]  CBC     Status: Abnormal   Collection Time: 12/01/17  6:44 AM  Result Value Ref Range   WBC 5.3 4.0 - 10.5 K/uL   RBC 2.35 (L)  4.22 - 5.81 MIL/uL   Hemoglobin 7.2 (L) 13.0 - 17.0 g/dL   HCT 21.2 (L) 39.0 - 52.0 %   MCV 90.2 78.0 - 100.0 fL   MCH 30.6 26.0 - 34.0 pg   MCHC 34.0 30.0 - 36.0 g/dL   RDW 14.0 11.5 - 15.5 %   Platelets 195 150 - 400 K/uL    Comment: Performed at [facility], [place] [address] [place], [place] [address]  Glucose, capillary     Status: Abnormal   Collection Time: 12/01/17  7:25 AM  Result Value Ref Range   Glucose-Capillary 121 (H) 65 - 99 mg/dL    Imaging / Studies: Ct Angio Chest Pe W Or Wo Contrast  Result Date: 11/29/2017 CLINICAL DATA:  Short of breath EXAM: CT ANGIOGRAPHY CHEST WITH CONTRAST TECHNIQUE: Multidetector CT imaging of the chest was performed using the standard protocol during bolus administration of intravenous contrast. Multiplanar CT image reconstructions and MIPs were obtained to evaluate the vascular anatomy. CONTRAST:  158m ISOVUE-370 IOPAMIDOL (ISOVUE-370) INJECTION 76% COMPARISON:  CT chest 06/10/2017 FINDINGS: Cardiovascular: Negative for pulmonary embolism. Mild atherosclerotic calcification aortic arch without aneurysm or dissection. Minimal coronary calcification. Heart size normal. No pericardial effusion Mediastinum/Nodes: Negative for mass or adenopathy Lungs/Pleura: Small bilateral pleural effusions right greater than left with mild bibasilar atelectasis. Negative for pneumonia or mass. Upper Abdomen: Surgical clips right upper quadrant consistent with cholecystectomy. No acute abnormality in the abdomen Musculoskeletal: Negative Review of the MIP images confirms the above findings. IMPRESSION: Negative for pulmonary embolism. Small bilateral effusions and mild bibasilar atelectasis Aortic Atherosclerosis (ICD10-I70.0). Electronically Signed   By: Franchot Gallo M.D.   On: 11/29/2017 14:11   Ct Abdomen Pelvis W Contrast  Result Date: 11/29/2017 CLINICAL DATA:  Gastrointestinal bleeding. Patient status post RIGHT colectomy sigmoidectomy. EXAM:  CT ABDOMEN AND PELVIS WITH CONTRAST TECHNIQUE: Multidetector CT imaging of the abdomen and pelvis was performed using the standard protocol following bolus administration of intravenous contrast. CONTRAST:  137m ISOVUE-370 IOPAMIDOL (ISOVUE-370) INJECTION 76% COMPARISON:  CT 06/10/2017 FINDINGS: Lower chest: Bilateral small effusions. Minimal atelectasis. No airspace disease no pericardial effusion Hepatobiliary: No focal hepatic lesion. Postcholecystectomy. No biliary dilatation. Several scattered low-density lesions within the liver unchanged from prior likely representing small hepatic cysts. Pancreas: Pancreas is normal. No ductal dilatation. No pancreatic inflammation. Spleen: Normal spleen Adrenals/urinary tract: Adrenal glands and kidneys are normal. The ureters and bladder normal. Stomach/Bowel: Stomach, duodenum small-bowel normal. Post RIGHT hemicolectomy. No complication at the enteric colonic anastomosis at the hepatic flexure. Transverse colon descending colon normal. Colonic colonic anastomosis at the level the proximal sigmoid colon without complicating features. Rectum normal. Vascular/Lymphatic: Abdominal aorta is normal caliber with atherosclerotic calcification. There is no retroperitoneal or periportal lymphadenopathy. No pelvic lymphadenopathy. Reproductive: Brachytherapy seeds within the prostate gland. Gas dissects along the nondependent margin of the LEFT and RIGHT inguinal canal/spermatic cords (image 90/4). Presumed related to insufflation of the abdominal cavity during laparoscopic/robotic surgery. Other: Small volume gas in the subcutaneous abdominal wall midline below the umbilicus. No intraperitoneal free fluid. No abscess Musculoskeletal: No aggressive osseous lesion. IMPRESSION: 1. No complications following RIGHT hemicolectomy and partial sigmoidectomy. No evidence of hematoma or active bleeding. 2. No abscess or significant free fluid. 3. Small volume gas in the ventral abdominal  wall and dissecting along the inguinal canals related to recent surgery. 4. Small bilateral pleural effusions Electronically Signed   By: SSuzy BouchardM.D.   On: 11/29/2017 14:19    Medications / Allergies: per chart  Antibiotics: Anti-infectives (From admission, onward)   Start     Dose/Rate Route Frequency Ordered Stop   11/26/17 2100  cefoTEtan (CEFOTAN) 2 g in sodium chloride 0.9 % 100 mL IVPB     2 g 200 mL/hr over 30 Minutes Intravenous Every 12 hours 11/26/17 1602 11/26/17 2115   11/26/17 1309  clindamycin (CLEOCIN) 900 mg, gentamicin (GARAMYCIN) 240 mg in sodium chloride 0.9 % 1,000 mL for intraperitoneal lavage  Status:  Discontinued       As needed 11/26/17 1309 11/26/17 1420   11/26/17 0645  cefoTEtan in Dextrose 5% (CEFOTAN) IVPB 2 g     2 g Intravenous On call to O.R. 11/26/17 0124505/15/19 0855   11/26/17 0645  clindamycin (CLEOCIN) 900 mg, gentamicin (GARAMYCIN) 240 mg in sodium chloride 0.9 % 1,000 mL for intraperitoneal lavage  Status:  Discontinued      Intraperitoneal To Surgery 11/26/17 0809905/15/19 1542        Note: Portions of this report may have been transcribed using voice recognition  software. Every effort was made to ensure accuracy; however, inadvertent computerized transcription errors may be present.   Any transcriptional errors that result from this process are unintentional.     [name], M.D., F.A.C.S. Gastrointestinal and Minimally Invasive Surgery [facility] [address] [place], [place] [address] [phone] Main / Paging   12/01/2017

## 2017-12-01 NOTE — Care Management Important Message (Signed)
Important Message  Patient Details  Name: Calvin Burnett MRN: [id number] Date of Birth: 1934-03-14   Medicare Important Message Given:  Yes    Kerin Salen 12/01/2017, 12:05 [place] Message  Patient Details  Name: Calvin Burnett MRN: [id number] Date of Birth: 11/17/1933   Medicare Important Message Given:  Yes    Kerin Salen 12/01/2017, 12:05 PM

## 2017-12-01 NOTE — Evaluation (Signed)
Physical Therapy Evaluation Patient Details Name: Calvin Burnett MRN: [id number] DOB: [birth year] Today's Date: 12/01/2017   History of Present Illness  Pt is an 84 year old male with hx of prostate cancer, DM, HTN, dementia and admitted for Polyp of hepatic flexure s/p robotic right colectomy 11/26/2017  Clinical Impression  Pt admitted with above diagnosis. Pt currently with functional limitations due to the deficits listed below (see PT Problem List).  Pt will benefit from skilled PT to increase their independence and safety with mobility to allow discharge to the venue listed below.  Pt assisted with ambulating good distance in hallway with RW.  Pt reports he lives alone however still performs his ADLs and IADLs (still driving and grocery shopping although states he no longer mows his yard).  Pt reports very little pain today.  Pt encouraged to ambulate at least 4x/day and start with nursing (will likely progress quickly to ambulate with supervision).  Pt hopeful for d/c home soon.     Follow Up Recommendations No PT follow up    Equipment Recommendations  None recommended by PT    Recommendations for Other Services       Precautions / Restrictions Precautions Precautions: Fall      Mobility  Bed Mobility               General bed mobility comments: pt up in recliner on arrival  Transfers Overall transfer level: Needs assistance Equipment used: Rolling walker (2 wheeled) Transfers: Sit to/from Stand Sit to Stand: Min guard         General transfer comment: reliance on UE assist, min/guard for safety  Ambulation/Gait Ambulation/Gait assistance: Min guard Ambulation Distance (Feet): 400 Feet Assistive device: Rolling walker (2 wheeled) Gait Pattern/deviations: Step-through pattern;Decreased stride length     General Gait Details: slow but steady pace with RW, no LOB observed  Stairs            Wheelchair Mobility    Modified Rankin (Stroke Patients  Only)       Balance Overall balance assessment: Needs assistance         Standing balance support: Bilateral upper extremity supported Standing balance-Leahy Scale: Poor Standing balance comment: requires UE support                             Pertinent Vitals/Pain Pain Assessment: No/denies pain    Home Living Family/patient expects to be discharged to:: Private residence Living Arrangements: Alone Available Help at Discharge: Family;Available PRN/intermittently Type of Home: House Home Access: Stairs to enter   Entrance Stairs-Number of Steps: 1 Home Layout: One level Home Equipment: Walker - 2 wheels;Cane - single point      Prior Function Level of Independence: Independent with assistive device(s)         Comments: reports using RW for mobility     Hand Dominance        Extremity/Trunk Assessment        Lower Extremity Assessment Lower Extremity Assessment: Generalized weakness       Communication   Communication: HOH  Cognition Arousal/Alertness: Awake/alert Behavior During Therapy: WFL for tasks assessed/performed Overall Cognitive Status: History of cognitive impairments - at baseline                                 General Comments: hx of Alzheimers dementia, appropriate during sessions, pleasant, able to  follow commands      General Comments      Exercises     Assessment/Plan    PT Assessment Patient needs continued PT services  PT Problem List Decreased strength;Decreased mobility;Decreased activity tolerance;Decreased balance;Decreased knowledge of use of DME       PT Treatment Interventions DME instruction;Therapeutic activities;Functional mobility training;Balance training;Stair training;Therapeutic exercise;Patient/family education;Gait training    PT Goals (Current goals can be found in the Care Plan section)  Acute Rehab PT Goals PT Goal Formulation: With patient Time For Goal Achievement:  12/08/17 Potential to Achieve Goals: Good    Frequency Min 3X/week   Barriers to discharge        Co-evaluation               AM-PAC PT "6 Clicks" Daily Activity  Outcome Measure Difficulty turning over in bed (including adjusting bedclothes, sheets and blankets)?: A Little Difficulty moving from lying on back to sitting on the side of the bed? : A Little Difficulty sitting down on and standing up from a chair with arms (e.g., wheelchair, bedside commode, etc,.)?: A Little Help needed moving to and from a bed to chair (including a wheelchair)?: A Little Help needed walking in hospital room?: A Little Help needed climbing 3-5 steps with a railing? : A Little 6 Click Score: 18    End of Session Equipment Utilized During Treatment: Gait belt Activity Tolerance: Patient tolerated treatment well Patient left: in chair;with chair alarm set;with call bell/phone within reach Nurse Communication: Mobility status PT Visit Diagnosis: Difficulty in walking, not elsewhere classified (R26.2)    Time: 6226-3335 PT Time Calculation (min) (ACUTE ONLY): 13 min   Charges:   PT Evaluation $PT Eval Low Complexity: 1 Low     PT G CodesCarmelia Bake, PT, DPT 12/01/2017 Pager: [phone]  York Ram E 12/01/2017, 2:49 PM

## 2017-12-02 LAB — TRANSFUSION REACTION
DAT C3: NEGATIVE
Post RXN DAT IgG: NEGATIVE

## 2017-12-02 LAB — GLUCOSE, CAPILLARY: GLUCOSE-CAPILLARY: 121 mg/dL — AB (ref 65–99)

## 2017-12-02 LAB — POTASSIUM: POTASSIUM: 3.8 mmol/L (ref 3.5–5.1)

## 2017-12-02 LAB — HEMOGLOBIN: HEMOGLOBIN: 7.5 g/dL — AB (ref 13.0–17.0)

## 2017-12-02 NOTE — Discharge Summary (Signed)
Physician Discharge Summary  Patient ID: Calvin Burnett MRN: [id number] DOB/AGE: [birth year]  84 y.o.  Admit date: 11/26/2017 Discharge date: 12/02/2017   Patient Care Team: Marin Olp, MD as PCP - General (Family Medicine) Michael Boston, MD as Consulting Physician (General Surgery) Ladene Artist, MD as Consulting Physician (Gastroenterology) Lelon Perla, MD as Consulting Physician (Cardiology)  Discharge Diagnoses:  Principal Problem:   pT1pN0 colon cancer s/p robotic right colectomy 11/26/2017 Active Problems:   Alzheimer's dementia   Essential hypertension   GERD   Diabetes mellitus type II, controlled ([place])   CKD (chronic kidney disease), stage III ([place])   Paroxysmal atrial fibrillation ([place])   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  Colonoscopy noted stricture most likely due to diverticular disease.  Polyps noted proximally.  One only partially resectable.  Therefore, the patient underwent surgical resection.  Remaining polyp at hepatic flexure so proximal right colectomy done.  Short sigmoid colectomy done.  60% of remaining colon left in place.  Postoperatively, the patient gradually mobilized and advanced to a solid diet.  His hemoglobin drifted down.  Had some mild hypotension improved with volume.  Hemoglobin drifted down again.  Transfusion ordered.  Patient had complained of chest pain and  shortness of breath.  Transferred to the intensive care unit.  Critical care consulted.  CT chest abdomen pelvis negative.  CV/pulmon w/u negative.  He improved rapidly.  Transfusion reaction suspected.  He improved.  Transfered to floor.  Advance to get on his diet.  Mobilize.  Cleared by physical and Occupational Therapy.  Hemoglobin stable for 72 hours.  Pain and other symptoms were treated aggressively.    By the time of discharge, the patient was walking well the hallways, eating food, having flatus.  Pain was well-controlled on an oral medications.  Based on meeting discharge criteria and continuing to recover, I felt it was safe for the patient to be discharged from the hospital to further recover with close followup. Postoperative recommendations were discussed in detail.  They are written as well.   Disposition:  Follow-up Information    Michael Boston, MD. Schedule an appointment as soon as possible for a visit in 2 weeks.   Specialty:  General Surgery Why:  To follow up after your operation, To follow up after your hospital stay Contact information: [place] [address] [phone]           Discharge disposition: 01-Home or Self Care       Discharge Instructions    Call MD for:   Complete by:  As directed    FEVER > 101.5 F  (temperatures < 101.5 F are not significant)   Call MD for:   Complete by:  As directed    FEVER > 101.5 F  (temperatures < 101.5 F are not significant)   Call MD for:  extreme fatigue   Complete by:  As directed    Call MD for:  extreme fatigue   Complete  by:  As directed    Call MD for:  persistant dizziness or light-headedness   Complete by:  As directed    Call MD for:  persistant dizziness or light-headedness   Complete by:  As directed    Call MD for:  persistant nausea and vomiting   Complete by:  As directed    Call MD for:  persistant nausea and vomiting   Complete by:  As directed    Call MD for:   redness, tenderness, or signs of infection (pain, swelling, redness, odor or green/yellow discharge around incision site)   Complete by:  As directed    Call MD for:  redness, tenderness, or signs of infection (pain, swelling, redness, odor or green/yellow discharge around incision site)   Complete by:  As directed    Call MD for:  severe uncontrolled pain   Complete by:  As directed    Call MD for:  severe uncontrolled pain   Complete by:  As directed    Diet - low sodium heart healthy   Complete by:  As directed    Start with a bland diet such as soups, liquids, starchy foods, low fat foods, etc. the first few days at home. Gradually advance to a solid, low-fat, high fiber diet by the end of the first week at home.   Add a fiber supplement to your diet (Metamucil, etc) If you feel full, bloated, or constipated, stay on a full liquid or pureed/blenderized diet for a few days until you feel better and are no longer constipated.   Diet - low sodium heart healthy   Complete by:  As directed    Start with a bland diet such as soups, liquids, starchy foods, low fat foods, etc. the first few days at home. Gradually advance to a solid, low-fat, high fiber diet by the end of the first week at home.   Add a fiber supplement to your diet (Metamucil, etc) If you feel full, bloated, or constipated, stay on a full liquid or pureed/blenderized diet for a few days until you feel better and are no longer constipated.   Discharge instructions   Complete by:  As directed    See Discharge Instructions If you are not getting better after two weeks or are noticing you are getting worse, contact our office [phone] for further advice.  We may need to adjust your medications, re-evaluate you in the office, send you to the emergency room, or see what other things we can do to help. The clinic staff is available to answer your questions during regular business hours (8:30am-5pm).  Please don't hesitate to call  and ask to speak to one of our nurses for clinical concerns.    A surgeon from [facility] is always on call at the hospitals 24 hours/day If you have a medical emergency, go to the nearest emergency room or call 911.   Discharge instructions   Complete by:  As directed    See Discharge Instructions If you are not getting better after two weeks or are noticing you are getting worse, contact our office [phone] for further advice.  We may need to adjust your medications, re-evaluate you in the office, send you to the emergency room, or see what other things we can do to help. The clinic staff is available to answer your questions during regular business hours (8:30am-5pm).  Please don't hesitate to call and ask to speak to one of our nurses for  clinical concerns.    A surgeon from [facility] is always on call at the hospitals 24 hours/day If you have a medical emergency, go to the nearest emergency room or call 911.   Discharge wound care:   Complete by:  As directed    It is good for closed incision and even open wounds to be washed every day.  Shower every day.  Short baths are fine.  Wash the incisions and wounds clean with soap & water.    If you have a closed incision(s), wash the incision with soap & water every day.  You may leave closed incisions open to air if it is dry.   You may cover the incision with clean gauze & replace it after your daily shower for comfort. If you have skin tapes (Steristrips) or skin glue (Dermabond) on your incision, leave them in place.  They will fall off on their own like a scab.  You may trim any edges that curl up with clean scissors.  If you have staples, set up an appointment for them to be removed in the office in 10 days after surgery.  If you have a drain, wash around the skin exit site with soap & water and place a new dressing of gauze or band aid around the skin every day.  Keep the drain site clean & dry.   Discharge  wound care:   Complete by:  As directed    It is good for closed incision and even open wounds to be washed every day.  Shower every day.  Baths are fine.  Wash the incisions and wounds clean with soap & water.    If you have a closed incision(s), wash the incision with soap & water every day.  You may leave closed incisions open to air if it is dry.   You may cover the incision with clean gauze & replace it after your daily shower for comfort.   Driving Restrictions   Complete by:  As directed    You may drive when: - you are no longer taking narcotic prescription pain medication - you can comfortably wear a seatbelt - you can safely make sudden turns/stops without pain.   Driving Restrictions   Complete by:  As directed    You may drive when: - you are no longer taking narcotic prescription pain medication - you can comfortably wear a seatbelt - you can safely make sudden turns/stops without pain.   Increase activity slowly   Complete by:  As directed    Start light daily activities --- self-care, walking, climbing stairs- beginning the day after surgery.  Gradually increase activities as tolerated.  Control your pain to be active.  Stop when you are tired.  Ideally, walk several times a day, eventually an hour a day.   Most people are back to most day-to-day activities in a few weeks.  It takes 4-6 weeks to get back to unrestricted, intense activity. If you can walk 30 minutes without difficulty, it is safe to try more intense activity such as jogging, treadmill, bicycling, low-impact aerobics, swimming, etc. Save the most intensive and strenuous activity for last (Usually 4-8 weeks after surgery) such as sit-ups, heavy lifting, contact sports, etc.  Refrain from any intense heavy lifting or straining until you are off narcotics for pain control.  You will have off days, but things should improve week-by-week. DO NOT PUSH THROUGH PAIN.  Let pain be your guide: If it hurts to  do something,  don't do it.   Increase activity slowly   Complete by:  As directed    Start light daily activities --- self-care, walking, climbing stairs- beginning the day after surgery.  Gradually increase activities as tolerated.  Control your pain to be active.  Stop when you are tired.  Ideally, walk several times a day, eventually an hour a day.   Most people are back to most day-to-day activities in a few weeks.  It takes 4-6 weeks to get back to unrestricted, intense activity. If you can walk 30 minutes without difficulty, it is safe to try more intense activity such as jogging, treadmill, bicycling, low-impact aerobics, swimming, etc. Save the most intensive and strenuous activity for last (Usually 4-8 weeks after surgery) such as sit-ups, heavy lifting, contact sports, etc.  Refrain from any intense heavy lifting or straining until you are off narcotics for pain control.  You will have off days, but things should improve week-by-week. DO NOT PUSH THROUGH PAIN.  Let pain be your guide: If it hurts to do something, don't do it.   Lifting restrictions   Complete by:  As directed    If you can walk 30 minutes without difficulty, it is safe to try more intense activity such as jogging, treadmill, bicycling, low-impact aerobics, swimming, etc. Save the most intensive and strenuous activity for last (Usually 4-8 weeks after surgery) such as sit-ups, heavy lifting, contact sports, etc.   Refrain from any intense heavy lifting or straining until you are off narcotics for pain control.  You will have off days, but things should improve week-by-week. DO NOT PUSH THROUGH PAIN.  Let pain be your guide: If it hurts to do something, don't do it.  Pain is your body warning you to avoid that activity for another week until the pain goes down.   Lifting restrictions   Complete by:  As directed    If you can walk 30 minutes without difficulty, it is safe to try more intense activity such as jogging, treadmill, bicycling,  low-impact aerobics, swimming, etc. Save the most intensive and strenuous activity for last (Usually 4-8 weeks after surgery) such as sit-ups, heavy lifting, contact sports, etc.   Refrain from any intense heavy lifting or straining until you are off narcotics for pain control.  You will have off days, but things should improve week-by-week. DO NOT PUSH THROUGH PAIN.  Let pain be your guide: If it hurts to do something, don't do it.  Pain is your body warning you to avoid that activity for another week until the pain goes down.   May shower / Bathe   Complete by:  As directed    May shower / Bathe   Complete by:  As directed    May walk up steps   Complete by:  As directed    May walk up steps   Complete by:  As directed    Sexual Activity Restrictions   Complete by:  As directed    You may have sexual intercourse when it is comfortable. If it hurts to do something, stop.   Sexual Activity Restrictions   Complete by:  As directed    You may have sexual intercourse when it is comfortable. If it hurts to do something, stop.      Allergies as of 12/02/2017   No Known Allergies     Medication List    TAKE these medications   acetaminophen 500 MG tablet Commonly known as:  TYLENOL Take 500 mg by mouth daily as needed for moderate pain or headache.   apixaban 5 MG Tabs tablet Commonly known as:  ELIQUIS Take 1 tablet (5 mg total) by mouth 2 (two) times daily.   latanoprost 0.005 % ophthalmic solution Commonly known as:  XALATAN PLACE 1 DROP INTO BOTH EYES NIGHTLY.   MELATONIN PO Take 1 tablet by mouth at bedtime as needed (sleep).   traMADol 50 MG tablet Commonly known as:  ULTRAM Take 1-2 tablets (50-100 mg total) by mouth every 6 (six) hours as needed for moderate pain or severe pain.            Discharge Care Instructions  (From admission, onward)        Start     Ordered   12/02/17 0000  Discharge wound care:    Comments:  It is good for closed incision and  even open wounds to be washed every day.  Shower every day.  Baths are fine.  Wash the incisions and wounds clean with soap & water.    If you have a closed incision(s), wash the incision with soap & water every day.  You may leave closed incisions open to air if it is dry.   You may cover the incision with clean gauze & replace it after your daily shower for comfort.   12/02/17 0737   11/26/17 0000  Discharge wound care:    Comments:  It is good for closed incision and even open wounds to be washed every day.  Shower every day.  Short baths are fine.  Wash the incisions and wounds clean with soap & water.    If you have a closed incision(s), wash the incision with soap & water every day.  You may leave closed incisions open to air if it is dry.   You may cover the incision with clean gauze & replace it after your daily shower for comfort. If you have skin tapes (Steristrips) or skin glue (Dermabond) on your incision, leave them in place.  They will fall off on their own like a scab.  You may trim any edges that curl up with clean scissors.  If you have staples, set up an appointment for them to be removed in the office in 10 days after surgery.  If you have a drain, wash around the skin exit site with soap & water and place a new dressing of gauze or band aid around the skin every day.  Keep the drain site clean & dry.   11/26/17 0735      Significant Diagnostic Studies:  Patient: JAVARIAN, JAKUBIAK Collected: 11/26/2017 Client: [facility] Accession: [id number] Received: 11/26/2017 Michael Boston, MD DOB: 1934/06/07 Age: 61 Gender: M Reported: 11/28/2017 [place] Patient Ph: [phone] MRN #: [id number] [place], [place] [address] Visit #: [id number] Chart #: Phone: [phone] Fax: CC: REPORT OF SURGICAL PATHOLOGY FINAL DIAGNOSIS Diagnosis 1. Colon, segmental resection, sigmoid, open end proximal - STRICTURE WITH DIVERTICULOSIS. - ONE BENIGN LYMPH NODE (0/1). -  NO EVIDENCE OF MALIGNANCY. 2. Colon, segmental resection for tumor, ascending - INVASIVE COLORECTAL ADENOCARCINOMA, 1 CM ARISING IN A SESSILE TUBULAR ADENOMA. - TUMOR EXTENDS INTO SUBMUCOSA. - MARGINS NOT INVOLVED. - NINETEEN BENIGN LYMPH NODES (0/19). - SEPARATE CECAL TUBULAR ADENOMA. 3. Colon, resection margin (donut), distal anastomotic ring - BENIGN COLON. - NO EVIDENCE OF MALIGNANCY. Microscopic Comment 2. COLON AND RECTUM: Resection, Including Transanal Disk Excision of Rectal Neoplasms Procedure: Right hemicolectomy, sigmoid stricture resection  and distal margin anastomotic ring. Tumor Site: Transverse. Tumor Size: 1 cm arising in a 1.4 cm sessile tubular adenoma. Macroscopic Tumor Perforation: No. Histologic Type: Colorectal adenocarcinoma. Histologic Grade: Well differentiated, grade I. Tumor Extension: Into submucosa. Margins: Free of tumor. Treatment Effect: No. Lymphovascular Invasion: No. Perineural Invasion: No. Tumor Deposits: No. Regional Lymph Nodes: 19 lymph nodes submitted or found. Number of Lymph Nodes Involved: 0. 1 of 3 FINAL for Calvin Burnett, Calvin Burnett [phone]) Microscopic Comment(continued) Number of Lymph Nodes Examined: 19. Pathologic Stage Classification (pTNM, AJCC 8th Edition): pT1, pN0. Ancillary Studies: Can be performed if requested (MMR / MSI testing: ) Representative tumor block: 2E and 32F. Comments: There is a sessile 1.4 cm tubular adenoma which has a 1.0 cm focus of invasive carcinoma which extends into the submucosa and does not involve the muscularis propria. There is a separate 0.6 cm in greatest dimension tubular adenoma within the cecum. Called to Dr. Johney Maine on 11/28/17. ([id number]) Claudette Laws MD Pathologist, Electronic Signature (Case signed 11/28/2017) Specimen Addeline Calarco and Clinical Information Specimen(s) Obtained: 1. Colon, segmental resection, sigmoid, open end proximal 2. Colon, segmental resection for tumor, ascending 3. Colon,  resection margin (donut), distal anastomotic ring Specimen Clinical Information 1. sigmoid stricture, polyp of hepatic flexure of colon [rd] Deaja Rizo 1. Specimen: Sigmoid colon. Specimen integrity: The proximal end is received open per requistion sheet. Length: 14 x 7 cm. Serosa: Tan-pink, smooth, focal area of indurated fat wrapping of the serosal surface in the area of a possible stricture. Contents: There is minimal tan green soft contents. Mucosa/Wall: The bowel is strictured at the proximal-third of the specimen, and there is prominent redundant mucosal folding and thickened wall up to 0.6 cm in this area. There is a 1.5 x 1.2 x 1 cm non perforated diverticula present at the stricture. There is an additional 0.4 cm possible polyp at the distal third of the specimen grossly limited to the mucosa. Lymph nodes: A single possible 0.5 cm lymph node is sampled from the soft tissue. Block Summary: A= proximal margin en face. B= distal margin en face. C= stricture with diverticulum. D= possible polyp with one lymph node whole. 2. Specimen: Terminal ileum, cecum, ascending colon, transverse colon. Specimen integrity: Intact. Specimen length: Terminal ileum is 6 cm, the cecum is 6 x 5 x 2.5 cm, and the ascending colon is 8.5 cm the portion of transverse colon is 14 cm. Appendix is grossly absent. Mesorectal intactness: N/A Tumor location: There is a single sessile polyp in the transverse colon, proximal to an area of tattoing. Tumor size: 1.4 x 1.3 cm. Percent of bowel circumference involved: 10-20% 2 of 3 FINAL for KERRINGTON, GREENHALGH [phone]) Spero Gunnels(continued) Tumor distance to margins: Proximal: 27 cm Distal: 5.5 cm Mesenteric (sigmoid and transverse): 7 cm Radial (posterior ascending, posterior descending; lateral and posterior mid-rectum; and entire lower 1/3 rectum): Greater than 10 cm. Macroscopic extent of tumor invasion: Grossly limited to the mucosa. Total presumed lymph  nodes: Twenty-one lymph nodes are identified ranging from 0.2 up to 1 cm with tan pink and brown black discolored cut surfaces. Extramural satellite tumor nodules: None Mucosal polyp(s): There is an additional 0.6 cm possible polyp in the cecum, grossly limited to mucosa. Additional findings: None. Block summary: A= proximal margin en face. B= distal margin en face. C-G= transverse colon lesion entirely. H= cecal polyp entirely. I= ileocecal valve. J= four lymph nodes whole. K= four lymph nodes whole. L= four lymph nodes whole. M= four lymph nodes whole. N= five  lymph nodes whole. 3. Received in formalin is a 0.8 cm in length x 2 cm in diameter donut shape portion of bowel, received with one end stapled. The stapled surface en face in block 3A. (AK:gt, 11/27/17) Report signed out from the following location(s) Technical component and interpretation was performed at [facility] [place], [place] [address]. CLIA #: [id number],      Results for orders placed or performed during the hospital encounter of 11/26/17 (from the past 72 hour(s))  Glucose, capillary     Status: Abnormal   Collection Time: 11/29/17  7:53 AM  Result Value Ref Range   Glucose-Capillary 106 (H) 65 - 99 mg/dL  Glucose, capillary     Status: Abnormal   Collection Time: 11/29/17 11:45 AM  Result Value Ref Range   Glucose-Capillary 185 (H) 65 - 99 mg/dL  CBC     Status: Abnormal   Collection Time: 11/29/17 12:17 PM  Result Value Ref Range   WBC 7.0 4.0 - 10.5 K/uL   RBC 2.74 (L) 4.22 - 5.81 MIL/uL   Hemoglobin 8.3 (L) 13.0 - 17.0 g/dL    Comment: POST TRANSFUSION SPECIMEN DELTA CHECK NOTED    HCT 25.4 (L) 39.0 - 52.0 %   MCV 92.7 78.0 - 100.0 fL   MCH 30.3 26.0 - 34.0 pg   MCHC 32.7 30.0 - 36.0 g/dL   RDW 14.0 11.5 - 15.5 %   Platelets 151 150 - 400 K/uL    Comment: Performed at [facility], [place] [address] [place], [place] [address]  D-dimer,  quantitative (not at [facility])     Status: Abnormal   Collection Time: 11/29/17 12:17 PM  Result Value Ref Range   D-Dimer, Quant 5.58 (H) 0.00 - 0.50 ug/mL-FEU    Comment: (NOTE) At the manufacturer cut-off of 0.50 ug/mL FEU, this assay has been documented to exclude PE with a sensitivity and negative predictive value of 97 to 99%.  At this time, this assay has not been approved by the FDA to exclude DVT/VTE. Results should be correlated with clinical presentation. Performed at [facility], [place] [address] [place], [place] [address]   Protime-INR     Status: None   Collection Time: 11/29/17 12:17 PM  Result Value Ref Range   Prothrombin Time 15.2 11.4 - 15.2 seconds   INR 1.21     Comment: Performed at [facility], [place] [address] [place], [place] [address]  CK total and CKMB (cardiac)not at [facility]     Status: Abnormal   Collection Time: 11/29/17 12:17 PM  Result Value Ref Range   Total CK 528 (H) 49 - 397 U/L   CK, MB 2.6 0.5 - 5.0 ng/mL   Relative Index 0.5 0.0 - 2.5    Comment: Performed at [facility] [address], [place], [place] [address]  Brain natriuretic peptide     Status: Abnormal   Collection Time: 11/29/17 12:17 PM  Result Value Ref Range   B Natriuretic Peptide 238.2 (H) 0.0 - 100.0 pg/mL    Comment: Performed at [facility], [place] [address], [place], [place] [address]  Transfusion reaction     Status: None (Preliminary result)   Collection Time: 11/29/17  1:25 PM  Result Value Ref Range   Post RXN DAT IgG NEG    DAT C3      NEG Performed at [facility], [address] [place],  [place] [address]    Path interp tx rxn PENDING   Urinalysis, Routine w reflex microscopic     Status: Abnormal   Collection Time: 11/29/17  2:16 PM  Result Value Ref Range   Color, Urine YELLOW YELLOW   APPearance CLEAR CLEAR   Specific Gravity, Urine 1.023 1.005 - 1.030   pH 5.0 5.0 - 8.0    Glucose, UA NEGATIVE NEGATIVE mg/dL   Hgb urine dipstick MODERATE (A) NEGATIVE   Bilirubin Urine NEGATIVE NEGATIVE   Ketones, ur NEGATIVE NEGATIVE mg/dL   Protein, ur 100 (A) NEGATIVE mg/dL   Nitrite NEGATIVE NEGATIVE   Leukocytes, UA NEGATIVE NEGATIVE   RBC / HPF 11-20 0 - 5 RBC/hpf   WBC, UA 0-5 0 - 5 WBC/hpf   Bacteria, UA RARE (A) NONE SEEN   Mucus PRESENT    Sperm, UA PRESENT     Comment: Performed at [facility], [place] [address] [place], [place] [address]  Glucose, capillary     Status: Abnormal   Collection Time: 11/29/17  3:59 PM  Result Value Ref Range   Glucose-Capillary 165 (H) 65 - 99 mg/dL   Comment 1 Notify RN    Comment 2 Document in Chart   Glucose, capillary     Status: Abnormal   Collection Time: 11/29/17  9:15 PM  Result Value Ref Range   Glucose-Capillary 129 (H) 65 - 99 mg/dL   Comment 1 Notify RN    Comment 2 Document in Chart   Comprehensive metabolic panel     Status: Abnormal   Collection Time: 11/30/17  3:40 AM  Result Value Ref Range   Sodium 139 135 - 145 mmol/L   Potassium 4.1 3.5 - 5.1 mmol/L   Chloride 108 101 - 111 mmol/L   CO2 22 22 - 32 mmol/L   Glucose, Bld 122 (H) 65 - 99 mg/dL   BUN 20 6 - 20 mg/dL   Creatinine, Ser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

## 2017-12-02 NOTE — Discharge Instructions (Signed)
RESTART YOUR BLOOD THINNER (ELIQUIS) THE FIRST DAY THAT YOU GET HOME   SURGERY: POST OP INSTRUCTIONS (Surgery for small bowel obstruction, colon resection, etc)   ######################################################################  EAT Gradually transition to a high fiber diet with a fiber supplement over the next few days after discharge  WALK Walk an hour a day.  Control your pain to do that.    CONTROL PAIN Control pain so that you can walk, sleep, tolerate sneezing/coughing, go up/down stairs.  HAVE A BOWEL MOVEMENT DAILY Keep your bowels regular to avoid problems.  OK to try a laxative to override constipation.  OK to use an antidairrheal to slow down diarrhea.  Call if not better after 2 tries  CALL IF YOU HAVE PROBLEMS/CONCERNS Call if you are still struggling despite following these instructions. Call if you have concerns not answered by these instructions  ######################################################################   DIET Follow a light diet the first few days at home.  Start with a bland diet such as soups, liquids, starchy foods, low fat foods, etc.  If you feel full, bloated, or constipated, stay on a ful liquid or pureed/blenderized diet for a few days until you feel better and no longer constipated. Be sure to drink plenty of fluids every day to avoid getting dehydrated (feeling dizzy, not urinating, etc.). Gradually add a fiber supplement to your diet over the next week.  Gradually get back to a regular solid diet.  Avoid fast food or heavy meals the first week as you are more likely to get nauseated. It is expected for your digestive tract to need a few months to get back to normal.  It is common for your bowel movements and stools to be irregular.  You will have occasional bloating and cramping that should eventually fade away.  Until you are eating solid food normally, off all pain medications, and back to regular activities; your bowels will not be  normal. Focus on eating a low-fat, high fiber diet the rest of your life (See Getting to [place], below).  CARE of your INCISION or WOUND It is good for closed incision and even open wounds to be washed every day.  Shower every day.  Short baths are fine.  Wash the incisions and wounds clean with soap & water.    If you have a closed incision(s), wash the incision with soap & water every day.  You may leave closed incisions open to air if it is dry.   You may cover the incision with clean gauze & replace it after your daily shower for comfort.   Pressure on the dressing for 30 minutes will stop most wound bleeding.  Eventually your body will heal & pull the open wound closed over the next few months.       ACTIVITIES as tolerated Start light daily activities --- self-care, walking, climbing stairs-- beginning the day after surgery.  Gradually increase activities as tolerated.  Control your pain to be active.  Stop when you are tired.  Ideally, walk several times a day, eventually an hour a day.   Most people are back to most day-to-day activities in a few weeks.  It takes 4-8 weeks to get back to unrestricted, intense activity. If you can walk 30 minutes without difficulty, it is safe to try more intense activity such as jogging, treadmill, bicycling, low-impact aerobics, swimming, etc. Save the most intensive and strenuous activity for last (Usually 4-8 weeks after surgery) such as sit-ups, heavy lifting, contact sports, etc.  Refrain from any intense heavy lifting or straining until you are off narcotics for pain control.  You will have off days, but things should improve week-by-week. DO NOT PUSH THROUGH PAIN.  Let pain be your guide: If it hurts to do something, don't do it.  Pain is your body warning you to avoid that activity for another week until the pain goes down. You may drive when you are no longer taking narcotic prescription pain medication, you can comfortably wear a  seatbelt, and you can safely make sudden turns/stops to protect yourself without hesitating due to pain. You may have sexual intercourse when it is comfortable. If it hurts to do something, stop.  MEDICATIONS Take your usually prescribed home medications unless otherwise directed.   Blood thinners:  Usually you can restart any strong blood thinners after the second postoperative day.  It is OK to take aspirin right away.     If you are on strong blood thinners (warfarin/Coumadin, Plavix, Xerelto, Eliquis, Pradaxa, etc), discuss with your surgeon, medicine PCP, and/or cardiologist for instructions on when to restart the blood thinner & if blood monitoring is needed (PT/INR blood check, etc).     PAIN CONTROL Pain after surgery or related to activity is often due to strain/injury to muscle, tendon, nerves and/or incisions.  This pain is usually short-term and will improve in a few months.  To help speed the process of healing and to get back to regular activity more quickly, DO THE FOLLOWING THINGS TOGETHER: 1. Increase activity gradually.  DO NOT PUSH THROUGH PAIN 2. Use Ice and/or Heat 3. Try Gentle Massage and/or Stretching 4. Take over the counter pain medication 5. Take Narcotic prescription pain medication for more severe pain  Good pain control = faster recovery.  It is better to take more medicine to be more active than to stay in bed all day to avoid medications. 1.  Increase activity gradually Avoid heavy lifting at first, then increase to lifting as tolerated over the next 6 weeks. Do not push through the pain.  Listen to your body and avoid positions and maneuvers than reproduce the pain.  Wait a few days before trying something more intense Walking an hour a day is encouraged to help your body recover faster and more safely.  Start slowly and stop when getting sore.  If you can walk 30 minutes without stopping or pain, you can try more intense activity (running, jogging, aerobics,  cycling, swimming, treadmill, sex, sports, weightlifting, etc.) Remember: If it hurts to do it, then dont do it! 2. Use Ice and/or Heat You will have swelling and bruising around the incisions.  This will take several weeks to resolve. Ice packs or heating pads (6-8 times a day, 30-60 minutes at a time) will help sooth soreness & bruising. Some people prefer to use ice alone, heat alone, or alternate between ice & heat.  Experiment and see what works best for you.  Consider trying ice for the first few days to help decrease swelling and bruising; then, switch to heat to help relax sore spots and speed recovery. Shower every day.  Short baths are fine.  It feels good!  Keep the incisions and wounds clean with soap & water.   3. Try Gentle Massage and/or Stretching Massage at the area of pain many times a day Stop if you feel pain - do not overdo it 4. Take over the counter pain medication This helps the muscle and nerve tissues become less irritable  and calm down faster Choose Acetaminophen 500mg  tabs (Tylenol) 1-2 pills with every meal and just before bedtime (avoid if you have liver problems or if you have acetaminophen in you narcotic prescription) Take with food/snack several times a day as directed for at least 2 weeks to help keep pain / soreness down & more manageable. 5. Take Narcotic prescription pain medication for more severe pain A prescription for strong pain control is often given to you upon discharge (for example: oxycodone/Percocet, hydrocodone/Norco/Vicodin, or tramadol/Ultram) Take your pain medication as prescribed. Be mindful that most narcotic prescriptions contain Tylenol (acetaminophen) as well - avoid taking too much Tylenol. If you are having problems/concerns with the prescription medicine (does not control pain, nausea, vomiting, rash, itching, etc.), please call us [phone] to see if we need to switch you to a different pain medicine that will work better for you  and/or control your side effects better. If you need a refill on your pain medication, you must call the office before 4 pm and on weekdays only.  By federal law, prescriptions for narcotics cannot be called into a pharmacy.  They must be filled out on paper & picked up from our office by the patient or authorized caretaker.  Prescriptions cannot be filled after 4 pm nor on weekends.    WHEN TO CALL us [phone] Severe uncontrolled or worsening pain  Fever over 101 F (38.5 C) Concerns with the incision: Worsening pain, redness, rash/hives, swelling, bleeding, or drainage Reactions / problems with new medications (itching, rash, hives, nausea, etc.) Nausea and/or vomiting Difficulty urinating Difficulty breathing Worsening fatigue, dizziness, lightheadedness, blurred vision Other concerns If you are not getting better after two weeks or are noticing you are getting worse, contact our office [phone] for further advice.  We may need to adjust your medications, re-evaluate you in the office, send you to the emergency room, or see what other things we can do to help. The clinic staff is available to answer your questions during regular business hours (8:30am-5pm).  Please dont hesitate to call and ask to speak to one of our nurses for clinical concerns.    A surgeon from [facility] is always on call at the hospitals 24 hours/day If you have a medical emergency, go to the nearest emergency room or call 911.  FOLLOW UP in our office One the day of your discharge from the hospital (or the next business weekday), please call [facility] to set up or confirm an appointment to see your surgeon in the office for a follow-up appointment.  Usually it is 2-3 weeks after your surgery.   If you have skin staples at your incision(s), let the office know so we can set up a time in the office for the nurse to remove them (usually around 10 days after surgery). Make sure that  you call for appointments the day of discharge (or the next business weekday) from the hospital to ensure a convenient appointment time. IF YOU HAVE DISABILITY OR FAMILY LEAVE FORMS, BRING THEM TO THE OFFICE FOR PROCESSING.  DO NOT GIVE THEM TO YOUR DOCTOR.  [facility] [address], [place], [place], [place]  [address] ? [phone] - Main [phone] - [place],  [phone] - Fax [email or web address]  GETTING TO GOOD BOWEL HEALTH. It is expected for your digestive tract to need a few months to get back to normal.  It is common for your bowel movements and stools  to be irregular.  You will have occasional bloating and cramping that should eventually fade away.  Until you are eating solid food normally, off all pain medications, and back to regular activities; your bowels will not be normal.   Avoiding constipation The goal: ONE SOFT BOWEL MOVEMENT A DAY!    Drink plenty of fluids.  Choose water first. TAKE A FIBER SUPPLEMENT EVERY DAY THE REST OF YOUR LIFE During your first week back home, gradually add back a fiber supplement every day Experiment which form you can tolerate.   There are many forms such as powders, tablets, wafers, gummies, etc Psyllium bran (Metamucil), methylcellulose (Citrucel), Miralax or Glycolax, Benefiber, Flax Seed.  Adjust the dose week-by-week (1/2 dose/day to 6 doses a day) until you are moving your bowels 1-2 times a day.  Cut back the dose or try a different fiber product if it is giving you problems such as diarrhea or bloating. Sometimes a laxative is needed to help jump-start bowels if constipated until the fiber supplement can help regulate your bowels.  If you are tolerating eating & you are farting, it is okay to try a gentle laxative such as double dose MiraLax, prune juice, or Milk of Magnesia.  Avoid using laxatives too often. Stool softeners can sometimes help counteract the constipating effects of narcotic  pain medicines.  It can also cause diarrhea, so avoid using for too long. If you are still constipated despite taking fiber daily, eating solids, and a few doses of laxatives, call our office. Controlling diarrhea Try drinking liquids and eating bland foods for a few days to avoid stressing your intestines further. Avoid dairy products (especially milk & ice cream) for a short time.  The intestines often can lose the ability to digest lactose when stressed. Avoid foods that cause gassiness or bloating.  Typical foods include beans and other legumes, cabbage, broccoli, and dairy foods.  Avoid greasy, spicy, fast foods.  Every person has some sensitivity to other foods, so listen to your body and avoid those foods that trigger problems for you. Probiotics (such as active yogurt, Align, etc) may help repopulate the intestines and colon with normal bacteria and calm down a sensitive digestive tract Adding a fiber supplement gradually can help thicken stools by absorbing excess fluid and retrain the intestines to act more normally.  Slowly increase the dose over a few weeks.  Too much fiber too soon can backfire and cause cramping & bloating. It is okay to try and slow down diarrhea with a few doses of antidiarrheal medicines.   Bismuth subsalicylate (ex. Kayopectate, Pepto Bismol) for a few doses can help control diarrhea.  Avoid if pregnant.   Loperamide (Imodium) can slow down diarrhea.  Start with one tablet (2mg ) first.  Avoid if you are having fevers or severe pain.  ILEOSTOMY PATIENTS WILL HAVE CHRONIC DIARRHEA since their colon is not in use.    Drink plenty of liquids.  You will need to drink even more glasses of water/liquid a day to avoid getting dehydrated. Record output from your ileostomy.  Expect to empty the bag every 3-4 hours at first.  Most people with a permanent ileostomy empty their bag 4-6 times at the least.   Use antidiarrheal medicine (especially Imodium) several times a day to  avoid getting dehydrated.  Start with a dose at bedtime & breakfast.  Adjust up or down as needed.  Increase antidiarrheal medications as directed to avoid emptying the bag more than 8 times a  day (every 3 hours). Work with your wound ostomy nurse to learn care for your ostomy.  See ostomy care instructions. TROUBLESHOOTING IRREGULAR BOWELS 1) Start with a soft & bland diet. No spicy, greasy, or fried foods.  2) Avoid gluten/wheat or dairy products from diet to see if symptoms improve. 3) Miralax 17gm or flax seed mixed in [place]. water or juice-daily. May use 2-4 times a day as needed. 4) Gas-X, Phazyme, etc. as needed for gas & bloating.  5) Prilosec (omeprazole) over-the-counter as needed 6)  Consider probiotics (Align, Activa, etc) to help calm the bowels down  Call your doctor if you are getting worse or not getting better.  Sometimes further testing (cultures, endoscopy, X-ray studies, CT scans, bloodwork, etc.) may be needed to help diagnose and treat the cause of the diarrhea. [facility], [place], [place], [place], [place]  [address] [phone] - Main.    [phone]  - Toll Free.   [phone] - Fax [email or web address]  GETTING TO GOOD BOWEL HEALTH.  ######################################################################  EAT Gradually transition to a high fiber diet with a fiber supplement over the next few weeks after discharge.  Start with a pureed / full liquid diet (see below)  WALK Walk an hour a day.  Control your pain to do that.    HAVE A BOWEL MOVEMENT DAILY Keep your bowels regular to avoid problems.  OK to try a laxative to override constipation.  OK to use an antidairrheal to slow down diarrhea.  Call if not better after 2 tries  CALL IF YOU HAVE PROBLEMS/CONCERNS Call if you are still struggling despite following these instructions. Call if you have concerns not answered by these  instructions  ######################################################################   Irregular bowel habits such as constipation and diarrhea can lead to many problems over time.  Having one soft bowel movement a day is the most important way to prevent further problems.  The anorectal canal is designed to handle stretching and feces to safely manage our ability to get rid of solid waste (feces, poop, stool) out of our body.  BUT, hard constipated stools can act like ripping concrete bricks and diarrhea can be a burning fire to this very sensitive area of our body, causing inflamed hemorrhoids, anal fissures, increasing risk is perirectal abscesses, abdominal pain/bloating, an making irritable bowel worse.      The goal: ONE SOFT BOWEL MOVEMENT A DAY!  To have soft, regular bowel movements:   Drink plenty of fluids, consider 4-6 tall glasses of water a day.    Take plenty of fiber.  Fiber is the undigested part of plant food that passes into the colon, acting s natures broom to encourage bowel motility and movement.  Fiber can absorb and hold large amounts of water. This results in a larger, bulkier stool, which is soft and easier to pass. Work gradually over several weeks up to 6 servings a day of fiber (25g a day even more if needed) in the form of: o Vegetables -- Root (potatoes, carrots, turnips), leafy green (lettuce, salad greens, celery, spinach), or cooked high residue (cabbage, broccoli, etc) o Fruit -- Fresh (unpeeled skin & pulp), Dried (prunes, apricots, cherries, etc ),  or stewed ( applesauce)  o Whole grain breads, pasta, etc (whole wheat)  o Bran cereals   Bulking Agents -- This type of water-retaining fiber generally is easily obtained each day by one of the following:  o Psyllium bran -- The psyllium plant is remarkable  because its ground seeds can retain so much water. This product is available as Metamucil, Konsyl, Effersyllium, Per Diem Fiber, or the less expensive generic  preparation in drug and health food stores. Although labeled a laxative, it really is not a laxative.  o Methylcellulose -- This is another fiber derived from wood which also retains water. It is available as Citrucel. o Polyethylene Glycol - and artificial fiber commonly called Miralax or Glycolax.  It is helpful for people with gassy or bloated feelings with regular fiber o Flax Seed - a less gassy fiber than psyllium  No reading or other relaxing activity while on the toilet. If bowel movements take longer than 5 minutes, you are too constipated  AVOID CONSTIPATION.  High fiber and water intake usually takes care of this.  Sometimes a laxative is needed to stimulate more frequent bowel movements, but   Laxatives are not a good long-term solution as it can wear the colon out.  They can help jump-start bowels if constipated, but should be relied on constantly without discussing with your doctor o Osmotics (Milk of Magnesia, Fleets phosphosoda, Magnesium citrate, MiraLax, GoLytely) are safer than  o Stimulants (Senokot, Castor Oil, Dulcolax, Ex Lax)    o Avoid taking laxatives for more than 7 days in a row.   IF SEVERELY CONSTIPATED, try a Bowel Retraining Program: o Do not use laxatives.  o Eat a diet high in roughage, such as bran cereals and leafy vegetables.  o Drink six (6) ounces of prune or apricot juice each morning.  o Eat two (2) large servings of stewed fruit each day.  o Take one (1) heaping tablespoon of a psyllium-based bulking agent twice a day. Use sugar-free sweetener when possible to avoid excessive calories.  o Eat a normal breakfast.  o Set aside 15 minutes after breakfast to sit on the toilet, but do not strain to have a bowel movement.  o If you do not have a bowel movement by the third day, use an enema and repeat the above steps.   Controlling diarrhea o Switch to liquids and simpler foods for a few days to avoid stressing your intestines further. o Avoid dairy  products (especially milk & ice cream) for a short time.  The intestines often can lose the ability to digest lactose when stressed. o Avoid foods that cause gassiness or bloating.  Typical foods include beans and other legumes, cabbage, broccoli, and dairy foods.  Every person has some sensitivity to other foods, so listen to our body and avoid those foods that trigger problems for you. o Adding fiber (Citrucel, Metamucil, psyllium, Miralax) gradually can help thicken stools by absorbing excess fluid and retrain the intestines to act more normally.  Slowly increase the dose over a few weeks.  Too much fiber too soon can backfire and cause cramping & bloating. o Probiotics (such as active yogurt, Align, etc) may help repopulate the intestines and colon with normal bacteria and calm down a sensitive digestive tract.  Most studies show it to be of mild help, though, and such products can be costly. o Medicines: - Bismuth subsalicylate (ex. Kayopectate, Pepto Bismol) every 30 minutes for up to 6 doses can help control diarrhea.  Avoid if pregnant. - Loperamide (Immodium) can slow down diarrhea.  Start with two tablets (4mg  total) first and then try one tablet every 6 hours.  Avoid if you are having fevers or severe pain.  If you are not better or start feeling worse, stop  all medicines and call your doctor for advice o Call your doctor if you are getting worse or not better.  Sometimes further testing (cultures, endoscopy, X-ray studies, bloodwork, etc) may be needed to help diagnose and treat the cause of the diarrhea.  TROUBLESHOOTING IRREGULAR BOWELS 1) Avoid extremes of bowel movements (no bad constipation/diarrhea) 2) Miralax 17gm mixed in 8oz. water or juice-daily. May use BID as needed.  3) Gas-x,Phazyme, etc. as needed for gas & bloating.  4) Soft,bland diet. No spicy,greasy,fried foods.  5) Prilosec over-the-counter as needed  6) May hold gluten/wheat products from diet to see if symptoms  improve.  7)  May try probiotics (Align, Activa, etc) to help calm the bowels down 7) If symptoms become worse call back immediately.

## 2017-12-03 LAB — TYPE AND SCREEN
ABO/RH(D): O POS
Antibody Screen: NEGATIVE
UNIT DIVISION: 0
Unit division: 0

## 2017-12-03 LAB — BPAM RBC
BLOOD PRODUCT EXPIRATION DATE: 201906112359
Blood Product Expiration Date: 201906112359
ISSUE DATE / TIME: 201905181027
UNIT TYPE AND RH: 5100
Unit Type and Rh: 5100

## 2017-12-04 ENCOUNTER — Other Ambulatory Visit: Payer: Self-pay

## 2017-12-04 ENCOUNTER — Encounter: Payer: Self-pay | Admitting: Family Medicine

## 2017-12-04 ENCOUNTER — Other Ambulatory Visit (INDEPENDENT_AMBULATORY_CARE_PROVIDER_SITE_OTHER): Payer: PPO

## 2017-12-04 DIAGNOSIS — E119 Type 2 diabetes mellitus without complications: Secondary | ICD-10-CM

## 2017-12-04 DIAGNOSIS — R413 Other amnesia: Secondary | ICD-10-CM

## 2017-12-04 DIAGNOSIS — E538 Deficiency of other specified B group vitamins: Secondary | ICD-10-CM | POA: Insufficient documentation

## 2017-12-04 LAB — CBC
HCT: 24.1 % — ABNORMAL LOW (ref 39.0–52.0)
MCHC: 34.3 g/dL (ref 30.0–36.0)
MCV: 92.7 fl (ref 78.0–100.0)
PLATELETS: 326 10*3/uL (ref 150.0–400.0)
RBC: 2.57 Mil/uL — AB (ref 4.22–5.81)
RDW: 14.7 % (ref 11.5–15.5)
WBC: 6.8 10*3/uL (ref 4.0–10.5)

## 2017-12-04 LAB — LIPID PANEL
CHOLESTEROL: 110 mg/dL (ref 0–200)
HDL: 29.2 mg/dL — AB (ref >39.00)
LDL Cholesterol: 62 mg/dL (ref 0–99)
NONHDL: 80.31
TRIGLYCERIDES: 94 mg/dL (ref 0.0–149.0)
Total CHOL/HDL Ratio: 4
VLDL: 18.8 mg/dL (ref 0.0–40.0)

## 2017-12-04 LAB — COMPREHENSIVE METABOLIC PANEL
ALBUMIN: 3.6 g/dL (ref 3.5–5.2)
ALK PHOS: 53 U/L (ref 39–117)
ALT: 21 U/L (ref 0–53)
AST: 21 U/L (ref 0–37)
BILIRUBIN TOTAL: 1.1 mg/dL (ref 0.2–1.2)
BUN: 16 mg/dL (ref 6–23)
CALCIUM: 8.5 mg/dL (ref 8.4–10.5)
CO2: 25 mEq/L (ref 19–32)
Chloride: 106 mEq/L (ref 96–112)
Creatinine, Ser: 1.31 mg/dL (ref 0.40–1.50)
GFR: 55.68 mL/min — AB (ref >60.00)
Glucose, Bld: 129 mg/dL — ABNORMAL HIGH (ref 70–99)
POTASSIUM: 4.1 meq/L (ref 3.5–5.1)
Sodium: 140 mEq/L (ref 135–145)
TOTAL PROTEIN: 6 g/dL (ref 6.0–8.3)

## 2017-12-04 LAB — TSH: TSH: 2.82 u[IU]/mL (ref 0.35–4.50)

## 2017-12-04 LAB — VITAMIN B12: VITAMIN B 12: 161 pg/mL — AB (ref 211–911)

## 2017-12-04 LAB — HEMOGLOBIN A1C: Hgb A1c MFr Bld: 6.5 % (ref 4.6–6.5)

## 2017-12-05 LAB — RPR: RPR Ser Ql: NONREACTIVE

## 2017-12-05 LAB — HIV ANTIBODY (ROUTINE TESTING W REFLEX): HIV: NONREACTIVE

## 2017-12-07 ENCOUNTER — Ambulatory Visit
Admission: RE | Admit: 2017-12-07 | Discharge: 2017-12-07 | Disposition: A | Discharge status: Home / Self Care | Payer: PPO | Source: Ambulatory Visit | Attending: Family Medicine | Admitting: Family Medicine

## 2017-12-07 DIAGNOSIS — R410 Disorientation, unspecified: Secondary | ICD-10-CM | POA: Diagnosis not present

## 2017-12-07 DIAGNOSIS — R413 Other amnesia: Secondary | ICD-10-CM

## 2017-12-07 MED ORDER — GADOBENATE DIMEGLUMINE 529 MG/ML IV SOLN
16.0000 mL | Freq: Once | INTRAVENOUS | Status: AC | PRN
  Administered 2017-12-07: 16 mL via INTRAVENOUS

## 2017-12-11 ENCOUNTER — Encounter: Payer: Self-pay | Admitting: Family Medicine

## 2017-12-11 ENCOUNTER — Other Ambulatory Visit: Payer: Self-pay

## 2017-12-11 ENCOUNTER — Ambulatory Visit (INDEPENDENT_AMBULATORY_CARE_PROVIDER_SITE_OTHER): Payer: PPO | Admitting: Family Medicine

## 2017-12-11 ENCOUNTER — Telehealth: Payer: Self-pay | Admitting: Family Medicine

## 2017-12-11 VITALS — BP 108/78 | HR 68 | Temp 98.1°F | Ht 70.0 in | Wt 172.0 lb

## 2017-12-11 DIAGNOSIS — F0150 Vascular dementia without behavioral disturbance: Secondary | ICD-10-CM | POA: Diagnosis not present

## 2017-12-11 DIAGNOSIS — I480 Paroxysmal atrial fibrillation: Secondary | ICD-10-CM

## 2017-12-11 DIAGNOSIS — E538 Deficiency of other specified B group vitamins: Secondary | ICD-10-CM

## 2017-12-11 DIAGNOSIS — L989 Disorder of the skin and subcutaneous tissue, unspecified: Secondary | ICD-10-CM

## 2017-12-11 MED ORDER — MEMANTINE HCL ER 28 MG PO CP24: 28.0000 mg | ORAL_CAPSULE | Freq: Every day | ORAL | 3 refills | Status: DC

## 2017-12-11 MED ORDER — CYANOCOBALAMIN 1000 MCG/ML IJ SOLN
1000.0000 ug | Freq: Once | INTRAMUSCULAR | Status: AC
  Administered 2017-12-11: 1000 ug via INTRAMUSCULAR

## 2017-12-11 NOTE — Assessment & Plan Note (Signed)
Lab Results  Component Value Date   VITAMINB12 161 (L) 12/04/2017  Weekly injections 4 months then monthly after that.  Recheck at next visit with me

## 2017-12-11 NOTE — Telephone Encounter (Signed)
Copied from [place] [id number]. Topic: Referral - Request >> Dec 11, 2017  4:08 PM Margot Ables wrote: Reason for CRM: requesting referral to dermatology for the spot on pts nose. Daughter states it was discussed with [name] 11/20/17. Pt had been to he [facility] in 2014 for a skin cancer spot. Please advise.

## 2017-12-11 NOTE — Patient Instructions (Addendum)
Please schedule a nurse visit for next Thursday, December 18, 2017 to receive your second B12 shot. These need to be scheduled weekly (on Thursdays) for the next 3 weeks and then we will schedule once a month.   See [name] in 4 months. We will repeat memory test  Start namenda back- this is due to new stroke since 2016. This helps with preventing worsening memory for folks that have had strokes.  It is very possible he has had atrial fibrillation at other times which led to the stroke because his blood pressure and cholesterol look fantastic

## 2017-12-11 NOTE — Telephone Encounter (Signed)
See note

## 2017-12-11 NOTE — Telephone Encounter (Signed)
Referral placed as requested.

## 2017-12-11 NOTE — Progress Notes (Signed)
Subjective:  Calvin Burnett is a 84 y.o. year old very pleasant male patient who presents for/with See problem oriented charting ROS- no worsening of memory since last visit. No chest pain or shortness of breath reported.  Minimal edema  Past Medical History-  Patient Active Problem List   Diagnosis Date Noted  . B12 deficiency 12/04/2017    Priority: High  . Paroxysmal atrial fibrillation ([place]) 04/09/2017    Priority: High  . Night sweats 09/16/2016    Priority: High  . Major depressive disorder with single episode, in full remission ([place]) 07/29/2016    Priority: High  . Rectal pain 10/14/2014    Priority: High  . Prostate cancer ([place]) 08/12/2012    Priority: High  . Diabetes mellitus type II, controlled ([place]) 07/31/2010    Priority: High  . Vascular dementia 07/27/2008    Priority: High  . History of adenomatous polyp of colon 10/27/2014    Priority: Medium  . Syncope 09/27/2014    Priority: Medium  . CKD (chronic kidney disease), stage III ([place]) 06/01/2014    Priority: Medium  . Dyslipidemia 03/01/2014    Priority: Medium  . Basal cell carcinoma 05/03/2009    Priority: Medium  . Essential hypertension 01/30/2007    Priority: Medium  . Bowel habit changes 10/14/2014    Priority: Low  . Bradycardia 09/27/2014    Priority: Low  . LEG CRAMPS, NOCTURNAL 04/30/2010    Priority: Low  . Cervical spondylosis without myelopathy 12/11/2007    Priority: Low  . GERD 11/05/2007    Priority: Low  . Postoperative anemia due to acute blood loss 11/27/2017  . Hypomagnesemia 11/27/2017  . Stricture of sigmoid s/p robotic sigmoidectomy 11/26/2017 11/26/2017  . Chronic anticoagulation 11/26/2017  . pT1pN0 colon cancer s/p robotic right colectomy 11/26/2017 10/02/2017  . Right inguinal hernia 10/02/2017    Medications- reviewed and updated Current Outpatient Medications  Medication Sig Dispense Refill  . acetaminophen (TYLENOL) 500 MG tablet Take 500 mg by mouth daily as needed  for moderate pain or headache.    Marland Kitchen apixaban (ELIQUIS) 5 MG TABS tablet Take 1 tablet (5 mg total) by mouth 2 (two) times daily. 60 tablet 11  . latanoprost (XALATAN) 0.005 % ophthalmic solution PLACE 1 DROP INTO BOTH EYES NIGHTLY.  12  . MELATONIN PO Take 1 tablet by mouth at bedtime as needed (sleep).    . traMADol (ULTRAM) 50 MG tablet Take 1-2 tablets (50-100 mg total) by mouth every 6 (six) hours as needed for moderate pain or severe pain. 20 tablet 0  . memantine (NAMENDA XR) 28 MG CP24 24 hr capsule Take 1 capsule (28 mg total) by mouth daily. 90 capsule 3   No current facility-administered medications for this visit.     Objective: BP 108/78 (BP Location: Left Arm, Patient Position: Sitting, Cuff Size: Large)   Pulse 68   Temp 98.1 F (36.7 C) (Oral)   Ht 5\' 10"  (1.778 m)   Wt 172 lb (78 kg)   SpO2 100%   BMI 24.68 kg/m  Gen: NAD, resting comfortably CV: Irregularly irregular no murmurs rubs or gallops Lungs: CTAB no crackles, wheeze, rhonchi Abdomen: soft/nontender/nondistended/normal bowel sounds.  Ext: Trace edema Skin: warm, dry Neuro: Some forgetfulness noted  Assessment/Plan:  Vascular dementia S:  MMSE at last visit was 21/30- last checked in 2017 and was 26/30. Had been on namenda with Dr. Arnoldo Morale back to 2015 without clear diagnosis.   We were concerned about alzheimer's dementia. Workup  showed 2 possible causes- including low b12 at 161 as well as MRI with prior stroke.    A/P: I was concerned about alzheimer's last visit. Today, with information from memory loss workup- I actually think this could be vascular dementia from prior stroke combined with low b12 causing memory changes. This could be good for his long term prognosis and indicate potential for stability or even improvement.   I will place him back on namenda which he tolerated well in the past (was placed on by Dr. Arnoldo Morale without clear indication that I have been able to tell in the past on chart  review)  He is on eliquis for atrial fibrillation - possibly had prior a fib leading to stroke (which is new since 2016). His other risk factors are controlled- His LDL without medication was 62 on last check and his BP looks great without meds today  He is to do weekly b12 injections for a month then change to monthly- may see some improvement with this but told them also may not see progress  Check back in 4 months from now- my hope is he gets an MMSE at [facility] as well in august  B12 deficiency Lab Results  Component Value Date   VITAMINB12 161 (L) 12/04/2017  Weekly injections 4 months then monthly after that.  Recheck at next visit with me  Paroxysmal atrial fibrillation ([place]) Stroke sometime between 2016 and 11/2017- a fib could have been cause and just not previously detected.  He is on Eliquis now with a lower risk of recurrent stroke now   Future Appointments  Date Time Provider [place]  12/18/2017 10:45 AM [facility] NURSE [facility]  03/06/2018  1:00 PM Drummond, Tyler Aas, RN [facility]   4 months advised  Lab/Order associations: Low serum vitamin B12 - Plan: cyanocobalamin ((VITAMIN B-12)) injection 1,000 mcg  Meds ordered this encounter  Medications  . cyanocobalamin ((VITAMIN B-12)) injection 1,000 mcg  . memantine (NAMENDA XR) 28 MG CP24 24 hr capsule    Sig: Take 1 capsule (28 mg total) by mouth daily.    Dispense:  90 capsule    Refill:  3    Return precautions advised.  [name], MD

## 2017-12-11 NOTE — Assessment & Plan Note (Signed)
S:  MMSE at last visit was 21/30- last checked in 2017 and was 26/30. Had been on namenda with Dr. Arnoldo Morale back to 2015 without clear diagnosis.   We were concerned about alzheimer's dementia. Workup showed 2 possible causes- including low b12 at 161 as well as MRI with prior stroke.    A/P: I was concerned about alzheimer's last visit. Today, with information from memory loss workup- I actually think this could be vascular dementia from prior stroke combined with low b12 causing memory changes. This could be good for his long term prognosis and indicate potential for stability or even improvement.   I will place him back on namenda which he tolerated well in the past (was placed on by Dr. Arnoldo Morale without clear indication that I have been able to tell in the past on chart review)  He is on eliquis for atrial fibrillation - possibly had prior a fib leading to stroke (which is new since 2016). His other risk factors are controlled- His LDL without medication was 62 on last check and his BP looks great without meds today  He is to do weekly b12 injections for a month then change to monthly- may see some improvement with this but told them also may not see progress  Check back in 4 months from now- my hope is he gets an MMSE at [facility] as well in august

## 2017-12-11 NOTE — Assessment & Plan Note (Signed)
Stroke sometime between 2016 and 11/2017- a fib could have been cause and just not previously detected.  He is on Eliquis now with a lower risk of recurrent stroke now

## 2017-12-18 ENCOUNTER — Ambulatory Visit (INDEPENDENT_AMBULATORY_CARE_PROVIDER_SITE_OTHER): Payer: PPO | Admitting: *Deleted

## 2017-12-18 ENCOUNTER — Encounter: Payer: Self-pay | Admitting: *Deleted

## 2017-12-18 DIAGNOSIS — E538 Deficiency of other specified B group vitamins: Secondary | ICD-10-CM | POA: Diagnosis not present

## 2017-12-18 MED ORDER — CYANOCOBALAMIN 1000 MCG/ML IJ SOLN
1000.0000 ug | Freq: Once | INTRAMUSCULAR | Status: AC
  Administered 2017-12-18: 1000 ug via INTRAMUSCULAR

## 2017-12-18 NOTE — Progress Notes (Signed)
Per orders of [name], injection of Vitamin B12 1000 mcg given in Right Deltoid by Anselmo Pickler, LPN Patient tolerated injection well. Pt told to return in one week for next B12 Injection. Pt verbalized understanding.

## 2017-12-25 ENCOUNTER — Ambulatory Visit (INDEPENDENT_AMBULATORY_CARE_PROVIDER_SITE_OTHER): Payer: PPO

## 2017-12-25 DIAGNOSIS — E538 Deficiency of other specified B group vitamins: Secondary | ICD-10-CM

## 2017-12-25 MED ORDER — CYANOCOBALAMIN 1000 MCG/ML IJ SOLN
1000.0000 ug | Freq: Once | INTRAMUSCULAR | Status: AC
  Administered 2017-12-25: 1000 ug via INTRAMUSCULAR

## 2017-12-25 NOTE — Progress Notes (Signed)
Patient in today for B12 injection due to B12 deficiency. Administered in the left arm. Patient tolerated well. Instructed patient to schedule an appointment for ext week. He verbalized understanding

## 2017-12-29 DIAGNOSIS — D649 Anemia, unspecified: Secondary | ICD-10-CM | POA: Diagnosis not present

## 2018-01-01 ENCOUNTER — Ambulatory Visit (INDEPENDENT_AMBULATORY_CARE_PROVIDER_SITE_OTHER): Payer: PPO

## 2018-01-01 DIAGNOSIS — E538 Deficiency of other specified B group vitamins: Secondary | ICD-10-CM

## 2018-01-01 MED ORDER — CYANOCOBALAMIN 1000 MCG/ML IJ SOLN
1000.0000 ug | Freq: Once | INTRAMUSCULAR | Status: AC
  Administered 2018-01-01: 1000 ug via INTRAMUSCULAR

## 2018-01-01 NOTE — Progress Notes (Signed)
Patient in today for b12 Injection due to B12 deficiency. Tolerated well in right arm.

## 2018-01-05 ENCOUNTER — Encounter: Payer: Self-pay | Admitting: Family Medicine

## 2018-01-05 ENCOUNTER — Ambulatory Visit (INDEPENDENT_AMBULATORY_CARE_PROVIDER_SITE_OTHER): Payer: PPO | Admitting: Family Medicine

## 2018-01-05 VITALS — BP 118/78 | HR 73 | Temp 99.0°F | Ht 70.0 in | Wt 170.6 lb

## 2018-01-05 DIAGNOSIS — R5383 Other fatigue: Secondary | ICD-10-CM

## 2018-01-05 DIAGNOSIS — D649 Anemia, unspecified: Secondary | ICD-10-CM

## 2018-01-05 DIAGNOSIS — I10 Essential (primary) hypertension: Secondary | ICD-10-CM

## 2018-01-05 DIAGNOSIS — E538 Deficiency of other specified B group vitamins: Secondary | ICD-10-CM | POA: Diagnosis not present

## 2018-01-05 LAB — CBC WITH DIFFERENTIAL/PLATELET
BASOS PCT: 1.3 % (ref 0.0–3.0)
Basophils Absolute: 0.1 10*3/uL (ref 0.0–0.1)
EOS ABS: 0.5 10*3/uL (ref 0.0–0.7)
Eosinophils Relative: 6.6 % — ABNORMAL HIGH (ref 0.0–5.0)
HEMATOCRIT: 29.6 % — AB (ref 39.0–52.0)
Hemoglobin: 9.6 g/dL — ABNORMAL LOW (ref 13.0–17.0)
Lymphocytes Relative: 34.5 % (ref 12.0–46.0)
Lymphs Abs: 2.5 10*3/uL (ref 0.7–4.0)
MCHC: 32.3 g/dL (ref 30.0–36.0)
MCV: 81.3 fl (ref 78.0–100.0)
MONO ABS: 0.5 10*3/uL (ref 0.1–1.0)
Monocytes Relative: 6.8 % (ref 3.0–12.0)
NEUTROS ABS: 3.6 10*3/uL (ref 1.4–7.7)
Neutrophils Relative %: 50.8 % (ref 43.0–77.0)
PLATELETS: 255 10*3/uL (ref 150.0–400.0)
RBC: 3.64 Mil/uL — ABNORMAL LOW (ref 4.22–5.81)
RDW: 19.5 % — AB (ref 11.5–15.5)
WBC: 7.1 10*3/uL (ref 4.0–10.5)

## 2018-01-05 LAB — COMPREHENSIVE METABOLIC PANEL
ALK PHOS: 68 U/L (ref 39–117)
ALT: 18 U/L (ref 0–53)
AST: 21 U/L (ref 0–37)
Albumin: 4 g/dL (ref 3.5–5.2)
BILIRUBIN TOTAL: 0.4 mg/dL (ref 0.2–1.2)
BUN: 23 mg/dL (ref 6–23)
CALCIUM: 8.9 mg/dL (ref 8.4–10.5)
CHLORIDE: 107 meq/L (ref 96–112)
CO2: 25 mEq/L (ref 19–32)
Creatinine, Ser: 1.57 mg/dL — ABNORMAL HIGH (ref 0.40–1.50)
GFR: 45.17 mL/min — ABNORMAL LOW (ref >60.00)
GLUCOSE: 111 mg/dL — AB (ref 70–99)
Potassium: 4.4 mEq/L (ref 3.5–5.1)
Sodium: 141 mEq/L (ref 135–145)
Total Protein: 6.6 g/dL (ref 6.0–8.3)

## 2018-01-05 LAB — TSH: TSH: 2.08 u[IU]/mL (ref 0.35–4.50)

## 2018-01-05 LAB — VITAMIN B12: VITAMIN B 12: 758 pg/mL (ref 211–911)

## 2018-01-05 NOTE — Patient Instructions (Addendum)
I would also like for you to sign up for an annual wellness visit with one of our nurses, Cassie or Manuela Schwartz, who both specialize in the annual wellness visit. This is a free benefit under medicare that may help [place] find additional ways to help you. Some highlights are reviewing medications, lifestyle, and doing a dementia screen.  Take a centrum silver each day. After you finish the vitamin D you an stop taking it  Also take ferrous sulfate 325 mg (this is iron) once a day  Please stop by the lab before you go.  Lets check back in 4-6 weeks from now to see how you are doing

## 2018-01-05 NOTE — Progress Notes (Signed)
Subjective:  Calvin Burnett is a 84 y.o. year old very pleasant male patient who presents for/with See problem oriented charting ROS- no chest pain or shortness of breath. Denies vertigo or dizziness- some "foggy Headedness" per his report but doesn't feel like he will fall or pass out   Past Medical History-  Patient Active Problem List   Diagnosis Date Noted  . B12 deficiency 12/04/2017    Priority: High  . Paroxysmal atrial fibrillation ([place]) 04/09/2017    Priority: High  . Night sweats 09/16/2016    Priority: High  . Major depressive disorder with single episode, in full remission ([place]) 07/29/2016    Priority: High  . Rectal pain 10/14/2014    Priority: High  . Prostate cancer ([place]) 08/12/2012    Priority: High  . Diabetes mellitus type II, controlled ([place]) 07/31/2010    Priority: High  . Vascular dementia 07/27/2008    Priority: High  . History of adenomatous polyp of colon 10/27/2014    Priority: Medium  . Syncope 09/27/2014    Priority: Medium  . CKD (chronic kidney disease), stage III ([place]) 06/01/2014    Priority: Medium  . Dyslipidemia 03/01/2014    Priority: Medium  . Basal cell carcinoma 05/03/2009    Priority: Medium  . Essential hypertension 01/30/2007    Priority: Medium  . Bowel habit changes 10/14/2014    Priority: Low  . Bradycardia 09/27/2014    Priority: Low  . LEG CRAMPS, NOCTURNAL 04/30/2010    Priority: Low  . Cervical spondylosis without myelopathy 12/11/2007    Priority: Low  . GERD 11/05/2007    Priority: Low  . Postoperative anemia due to acute blood loss 11/27/2017  . Hypomagnesemia 11/27/2017  . Stricture of sigmoid s/p robotic sigmoidectomy 11/26/2017 11/26/2017  . Chronic anticoagulation 11/26/2017  . pT1pN0 colon cancer s/p robotic right colectomy 11/26/2017 10/02/2017  . Right inguinal hernia 10/02/2017    Medications- reviewed and updated Current Outpatient Medications  Medication Sig Dispense Refill  . acetaminophen (TYLENOL)  500 MG tablet Take 500 mg by mouth daily as needed for moderate pain or headache.    Marland Kitchen apixaban (ELIQUIS) 5 MG TABS tablet Take 1 tablet (5 mg total) by mouth 2 (two) times daily. 60 tablet 11  . latanoprost (XALATAN) 0.005 % ophthalmic solution PLACE 1 DROP INTO BOTH EYES NIGHTLY.  12  . MELATONIN PO Take 1 tablet by mouth at bedtime as needed (sleep).    . memantine (NAMENDA XR) 28 MG CP24 24 hr capsule Take 1 capsule (28 mg total) by mouth daily. 90 capsule 3  . metoprolol succinate (TOPROL-XL) 25 MG 24 hr tablet Take 12.5 mg by mouth daily.  5   No current facility-administered medications for this visit.     Objective: BP 118/78 (BP Location: Left Arm, Patient Position: Sitting, Cuff Size: Large)   Pulse 73   Temp 99 F (37.2 C) (Oral)   Ht 5\' 10"  (1.778 m)   Wt 170 lb 9.6 oz (77.4 kg)   SpO2 97%   BMI 24.48 kg/m  Gen: NAD, resting comfortably CV: irregularly irregular, no murmurs rubs or gallops Lungs: CTAB no crackles, wheeze, rhonchi Abdomen: soft/nontender/nondistended/normal bowel sounds. No rebound or guarding.  Ext: trace edema Skin: warm, dry  Assessment/Plan:  B12 deficiency - Plan: Vitamin B12  Anemia, unspecified type - Plan: Iron, TIBC and Ferritin Panel  Fatigue, unspecified type - Plan: CBC with Differential/Platelet, Comprehensive metabolic panel, TSH S: has had significant fatigue since his surgery with  Dr. Johney Maine. Able to walk a mile in about 20 minutes each day but otherwise things around the house just make him exhausted. No chest pain. Mild SOB [place].   Doing anything around the house pulling things he just feels exhaustead  He denies lightheadedness. occassionally feels "foggy headed" but not at risk for falling or passing out.  Lab Results  Component Value Date   VITAMINB12 161 (L) 12/04/2017   He was anemic last month but was improving after surgery.   A/P: will update labs to investigate his fatigue. Hgb improving on  labs- will have him take iron OTC- see avs. Slight worsening of creatinine- will increase fluids and repeat- perhaps this will improve his fatigue. No exertional chest pain and DOE only with [place]. Would like to check back in 3-6 weeks and advised him of such  b12 has much improved- can go to monthly injections at this point Lab Results  Component Value Date   VITAMINB12 758 01/05/2018   Had pretty extensive cancer workup in may of this year due to night sweats history- doubt fatigue is malignancy related  Future Appointments  Date Time Provider [place]  02/02/2018  2:45 PM Marin Olp, MD [facility]  03/06/2018  1:00 PM Williemae Area, RN [facility]   Lab/Order associations: B12 deficiency - Plan: Vitamin B12  Anemia, unspecified type - Plan: Iron, TIBC and Ferritin Panel  Fatigue, unspecified type - Plan: CBC with Differential/Platelet, Comprehensive metabolic panel, TSH  Return precautions advised.  [name], MD

## 2018-01-06 ENCOUNTER — Other Ambulatory Visit: Payer: Self-pay

## 2018-01-06 DIAGNOSIS — R7989 Other specified abnormal findings of blood chemistry: Secondary | ICD-10-CM

## 2018-01-06 LAB — IRON,TIBC AND FERRITIN PANEL
%SAT: 7 % (calc) — ABNORMAL LOW (ref 20–48)
FERRITIN: 44 ng/mL (ref 24–380)
IRON: 29 ug/dL — AB (ref 50–180)
TIBC: 391 ug/dL (ref 250–425)

## 2018-01-06 NOTE — Assessment & Plan Note (Signed)
Controlled on Metoprolol 12.5 mg for a fib. Off all other bp meds

## 2018-01-27 ENCOUNTER — Other Ambulatory Visit (INDEPENDENT_AMBULATORY_CARE_PROVIDER_SITE_OTHER): Payer: PPO

## 2018-01-27 DIAGNOSIS — R7989 Other specified abnormal findings of blood chemistry: Secondary | ICD-10-CM

## 2018-01-27 LAB — COMPREHENSIVE METABOLIC PANEL
ALK PHOS: 73 U/L (ref 39–117)
ALT: 27 U/L (ref 0–53)
AST: 26 U/L (ref 0–37)
Albumin: 4 g/dL (ref 3.5–5.2)
BUN: 22 mg/dL (ref 6–23)
CHLORIDE: 110 meq/L (ref 96–112)
CO2: 27 mEq/L (ref 19–32)
Calcium: 8.8 mg/dL (ref 8.4–10.5)
Creatinine, Ser: 1.45 mg/dL (ref 0.40–1.50)
GFR: 49.5 mL/min — AB (ref >60.00)
GLUCOSE: 128 mg/dL — AB (ref 70–99)
POTASSIUM: 4.2 meq/L (ref 3.5–5.1)
Sodium: 144 mEq/L (ref 135–145)
TOTAL PROTEIN: 6.6 g/dL (ref 6.0–8.3)
Total Bilirubin: 0.5 mg/dL (ref 0.2–1.2)

## 2018-02-02 ENCOUNTER — Encounter: Payer: Self-pay | Admitting: Family Medicine

## 2018-02-02 ENCOUNTER — Ambulatory Visit (INDEPENDENT_AMBULATORY_CARE_PROVIDER_SITE_OTHER): Payer: PPO | Admitting: Family Medicine

## 2018-02-02 VITALS — BP 108/76 | HR 57 | Temp 98.2°F | Ht 70.0 in | Wt 175.6 lb

## 2018-02-02 DIAGNOSIS — E119 Type 2 diabetes mellitus without complications: Secondary | ICD-10-CM | POA: Diagnosis not present

## 2018-02-02 DIAGNOSIS — E538 Deficiency of other specified B group vitamins: Secondary | ICD-10-CM | POA: Diagnosis not present

## 2018-02-02 DIAGNOSIS — I480 Paroxysmal atrial fibrillation: Secondary | ICD-10-CM | POA: Diagnosis not present

## 2018-02-02 DIAGNOSIS — F325 Major depressive disorder, single episode, in full remission: Secondary | ICD-10-CM | POA: Diagnosis not present

## 2018-02-02 DIAGNOSIS — R5383 Other fatigue: Secondary | ICD-10-CM

## 2018-02-02 MED ORDER — CYANOCOBALAMIN 1000 MCG/ML IJ SOLN
1000.0000 ug | Freq: Once | INTRAMUSCULAR | Status: AC
  Administered 2018-02-02: 1000 ug via INTRAMUSCULAR

## 2018-02-02 NOTE — Assessment & Plan Note (Signed)
S: Complained of night sweats on SSRI- depression in remission despite no rx in 01/2018. PHQ9 of 2 today A/P: continue current no rx. Major depression in full remission single episode

## 2018-02-02 NOTE — Progress Notes (Signed)
Subjective:  Calvin Burnett is a 84 y.o. year old very pleasant male patient who presents for/with See problem oriented charting ROS- fatigue is much improved. Denies shortness of breath. Denies hypoglycemia   Past Medical History-  Patient Active Problem List   Diagnosis Date Noted  . B12 deficiency 12/04/2017    Priority: High  . pT1pN0 colon cancer s/p robotic right colectomy 11/26/2017 10/02/2017    Priority: High  . Paroxysmal atrial fibrillation ([place]) 04/09/2017    Priority: High  . Night sweats 09/16/2016    Priority: High  . Major depressive disorder with single episode, in full remission ([place]) 07/29/2016    Priority: High  . Rectal pain 10/14/2014    Priority: High  . Prostate cancer ([place]) 08/12/2012    Priority: High  . Diabetes mellitus type II, controlled ([place]) 07/31/2010    Priority: High  . Vascular dementia 07/27/2008    Priority: High  . History of adenomatous polyp of colon 10/27/2014    Priority: Medium  . Syncope 09/27/2014    Priority: Medium  . CKD (chronic kidney disease), stage III ([place]) 06/01/2014    Priority: Medium  . Dyslipidemia 03/01/2014    Priority: Medium  . Basal cell carcinoma 05/03/2009    Priority: Medium  . Essential hypertension 01/30/2007    Priority: Medium  . Stricture of sigmoid s/p robotic sigmoidectomy 11/26/2017 11/26/2017    Priority: Low  . Chronic anticoagulation 11/26/2017    Priority: Low  . Right inguinal hernia 10/02/2017    Priority: Low  . Bowel habit changes 10/14/2014    Priority: Low  . Bradycardia 09/27/2014    Priority: Low  . LEG CRAMPS, NOCTURNAL 04/30/2010    Priority: Low  . Cervical spondylosis without myelopathy 12/11/2007    Priority: Low  . GERD 11/05/2007    Priority: Low    Medications- reviewed and updated Current Outpatient Medications  Medication Sig Dispense Refill  . acetaminophen (TYLENOL) 500 MG tablet Take 500 mg by mouth daily as needed for moderate pain or headache.    Marland Kitchen apixaban  (ELIQUIS) 5 MG TABS tablet Take 1 tablet (5 mg total) by mouth 2 (two) times daily. 60 tablet 11  . latanoprost (XALATAN) 0.005 % ophthalmic solution PLACE 1 DROP INTO BOTH EYES NIGHTLY.  12  . MELATONIN PO Take 1 tablet by mouth at bedtime as needed (sleep).    . memantine (NAMENDA XR) 28 MG CP24 24 hr capsule Take 1 capsule (28 mg total) by mouth daily. 90 capsule 3  . metoprolol succinate (TOPROL-XL) 25 MG 24 hr tablet Take 12.5 mg by mouth daily.  5   No current facility-administered medications for this visit.     Objective: BP 108/76 (BP Location: Left Arm, Patient Position: Sitting, Cuff Size: Normal)   Pulse (!) 57   Temp 98.2 F (36.8 C) (Oral)   Ht 5\' 10"  (1.778 m)   Wt 175 lb 9.6 oz (79.7 kg)   SpO2 96%   BMI 25.20 kg/m  Gen: NAD, resting comfortably CV: slightly bradycardic no murmurs rubs or gallops Lungs: CTAB no crackles, wheeze, rhonchi Abdomen: soft/nontender/nondistended/normal bowel sounds.  Ext: no edema Skin: warm, dry Neuro: some forgetfulness   Assessment/Plan:  Fatigue, unspecified type S: last visit patient was complaining of fatigue since surgery with Dr. Johney Maine. Was feeling echausted with household activities on one hand btu could also walk a mile in 20 minutes without significant difficulty. b12 was much improved at that time and able to go to  monthly injections  Today, he states he is up to 26 minutes and over a mile now. Denies issues doing work around house. Compliant with iron. Has increased his water intake.   A/P: Glad fatigue has improved- perhaps improved by hydration or improved iron stores- reassess next visit  Diabetes mellitus type II, controlled ([place]) S: diet controlled Lab Results  Component Value Date   HGBA1C 6.5 12/04/2017   HGBA1C 6.8 (H) 11/25/2017   HGBA1C 6.7 07/28/2017   A/P: update a1c at 3 month follow up- has been doing well. Encouraged updated diabetic eye exam    Major depressive disorder with single episode, in full  remission ([place]) S: Complained of night sweats on SSRI- depression in remission despite no rx in 01/2018. PHQ9 of 2 today A/P: continue current no rx. Major depression in full remission single episode  Paroxysmal atrial fibrillation ([place]) Remains on eliquis. Metoprolol low dose for rate control and HR remains in 50s or 60s   Future Appointments  Date Time Provider [place]  03/05/2018 10:30 AM [facility] NURSE [facility]  03/06/2018  1:00 PM [facility] HEALTH COACH [facility]  04/07/2018 10:30 AM [facility] NURSE [facility]   Return in about 1 month (around 03/05/2018) for Monthly B12 Injection.  Lab/Order associations: B12 deficiency - Plan: cyanocobalamin ((VITAMIN B-12)) injection 1,000 mcg  Fatigue, unspecified type  Controlled type 2 diabetes mellitus without complication, without long-term current use of insulin ([name])  Major depressive disorder with single episode, in full remission ([place])  Paroxysmal atrial fibrillation ([place])  Meds ordered this encounter  Medications  . cyanocobalamin ((VITAMIN B-12)) injection 1,000 mcg   Return precautions advised.  [name], MD

## 2018-02-02 NOTE — Patient Instructions (Addendum)
Health Maintenance Due  Topic Date Due  . OPHTHALMOLOGY EXAM - make sure to have your eye doctor send a copy of your next diabetic eye exam to [place]. Please go home and confirm that this is scheduled 01/13/2018  . I would also like for you to sign up for an annual wellness visit with one of our nurses, Cassie or Manuela Schwartz, who both specialize in the annual wellness visit. This is a free benefit under medicare that may help [place] find additional ways to help you. Some highlights are reviewing medications, lifestyle, and doing a dementia screen.   . Please check with your pharmacy to see if they have the shingrix vaccine. If they do- please get this immunization and update [place] by phone call or mychart with dates you receive the vaccine     B12 shot before you leave today. Schedule a visit in 1 month- we will do these monthly for now  Schedule your diabetic eye exam if not already scheduled.   Lets check back in 3 months form now to make sure you are still feeling well

## 2018-02-02 NOTE — Assessment & Plan Note (Addendum)
Remains on eliquis. Metoprolol low dose for rate control and HR remains in 50s or 60s

## 2018-02-02 NOTE — Assessment & Plan Note (Signed)
S: diet controlled Lab Results  Component Value Date   HGBA1C 6.5 12/04/2017   HGBA1C 6.8 (H) 11/25/2017   HGBA1C 6.7 07/28/2017   A/P: update a1c at 3 month follow up- has been doing well. Encouraged updated diabetic eye exam

## 2018-02-25 ENCOUNTER — Other Ambulatory Visit: Payer: Self-pay | Admitting: Dermatology

## 2018-02-25 DIAGNOSIS — L570 Actinic keratosis: Secondary | ICD-10-CM | POA: Diagnosis not present

## 2018-02-25 DIAGNOSIS — D229 Melanocytic nevi, unspecified: Secondary | ICD-10-CM | POA: Diagnosis not present

## 2018-02-25 DIAGNOSIS — C44619 Basal cell carcinoma of skin of left upper limb, including shoulder: Secondary | ICD-10-CM | POA: Diagnosis not present

## 2018-02-25 DIAGNOSIS — C44519 Basal cell carcinoma of skin of other part of trunk: Secondary | ICD-10-CM | POA: Diagnosis not present

## 2018-02-25 DIAGNOSIS — C44719 Basal cell carcinoma of skin of left lower limb, including hip: Secondary | ICD-10-CM | POA: Diagnosis not present

## 2018-02-25 DIAGNOSIS — C44319 Basal cell carcinoma of skin of other parts of face: Secondary | ICD-10-CM | POA: Diagnosis not present

## 2018-03-05 ENCOUNTER — Ambulatory Visit: Payer: PPO

## 2018-03-05 ENCOUNTER — Ambulatory Visit (INDEPENDENT_AMBULATORY_CARE_PROVIDER_SITE_OTHER): Payer: PPO

## 2018-03-05 DIAGNOSIS — E538 Deficiency of other specified B group vitamins: Secondary | ICD-10-CM | POA: Diagnosis not present

## 2018-03-05 MED ORDER — CYANOCOBALAMIN 1000 MCG/ML IJ SOLN
1000.0000 ug | Freq: Once | INTRAMUSCULAR | Status: AC
  Administered 2018-03-05: 1000 ug via INTRAMUSCULAR

## 2018-03-05 NOTE — Progress Notes (Signed)
Per orders of [name], injection of Vitamin b12 given 1 ml, left deltoid IM by Clearnce Sorrel Durrel Mcnee, CMA. Patient tolerated injection well.  Pt has appt schedule for 04/07/18 for next injection

## 2018-03-06 ENCOUNTER — Ambulatory Visit: Payer: PPO

## 2018-03-09 ENCOUNTER — Ambulatory Visit (INDEPENDENT_AMBULATORY_CARE_PROVIDER_SITE_OTHER): Payer: PPO

## 2018-03-09 VITALS — BP 126/78 | HR 65 | Ht 70.0 in | Wt 175.4 lb

## 2018-03-09 DIAGNOSIS — Z0000 Encounter for general adult medical examination without abnormal findings: Secondary | ICD-10-CM

## 2018-03-09 NOTE — Progress Notes (Signed)
PCP notes: Last OV note 02/02/2018, monthly B12 injections   Health maintenance: Tetanus shot due- go to the pharmacy and let us know when you get this shot, Eye Exam- states he is scheduled with [facility], Schedule Flu shot for this Fall   Abnormal screenings: None   Patient concerns: None   Nurse concerns: Has some memory recall trouble    Next PCP appt: Schedule as Needed, Monthly B12 injection scheduled

## 2018-03-09 NOTE — Patient Instructions (Signed)
Calvin Burnett , Thank you for taking time to come for your Medicare Wellness Visit. I appreciate your ongoing commitment to your health goals. Please review the following plan we discussed and let me know if I can assist you in the future.   These are the goals we discussed: Goals    . patient     Still stay engaged socialization        This is a list of the screening recommended for you and due dates:  Health Maintenance  Topic Date Due  . Tetanus Vaccine -please get at the pharmacy and let usknow 07/15/2016  . Eye exam for diabetics [facility], please schedule 01/13/2018  . Flu Shot-schedule for this Fall  02/12/2018  . Complete foot exam   03/05/2018  . Hemoglobin A1C  06/06/2018  . Urine Protein Check  11/21/2018  . Colon Cancer Screening  11/27/2018  . Pneumonia vaccines  Completed    Preventive Care for Adults  A healthy lifestyle and preventive care can promote health and wellness. Preventive health guidelines for adults include the following key practices.  . A routine yearly physical is a good way to check with your health care provider about your health and preventive screening. It is a chance to share any concerns and updates on your health and to receive a thorough exam.  . Visit your dentist for a routine exam and preventive care every 6 months. Brush your teeth twice a day and floss once a day. Good oral hygiene prevents tooth decay and gum disease.  . The frequency of eye exams is based on your age, health, family medical history, use  of contact lenses, and other factors. Follow your health care provider's recommendations for frequency of eye exams.  . Eat a healthy diet. Foods like vegetables, fruits, whole grains, low-fat dairy products, and lean protein foods contain the nutrients you need without too many calories. Decrease your intake of foods high in solid fats, added sugars, and salt. Eat the right amount of calories for you. Get information about a proper  diet from your health care provider, if necessary.  . Regular physical exercise is one of the most important things you can do for your health. Most adults should get at least 150 minutes of moderate-intensity exercise (any activity that increases your heart rate and causes you to sweat) each week. In addition, most adults need muscle-strengthening exercises on 2 or more days a week.  Silver Sneakers may be a benefit available to you. To determine eligibility, you may visit the website: www.silversneakers.com or contact program at 223 247 9308 Mon-Fri between 8AM-8PM.   . Maintain a healthy weight. The body mass index (BMI) is a screening tool to identify possible weight problems. It provides an estimate of body fat based on height and weight. Your health care provider can find your BMI and can help you achieve or maintain a healthy weight.   For adults 20 years and older: ? A BMI below 18.5 is considered underweight. ? A BMI of 18.5 to 24.9 is normal. ? A BMI of 25 to 29.9 is considered overweight. ? A BMI of 30 and above is considered obese.   . Maintain normal blood lipids and cholesterol levels by exercising and minimizing your intake of saturated fat. Eat a balanced diet with plenty of fruit and vegetables. Blood tests for lipids and cholesterol should begin at age 31 and be repeated every 5 years. If your lipid or cholesterol levels are high, you are over  50, or you are at high risk for heart disease, you may need your cholesterol levels checked more frequently. Ongoing high lipid and cholesterol levels should be treated with medicines if diet and exercise are not working.  . If you smoke, find out from your health care provider how to quit. If you do not use tobacco, please do not start.  . If you choose to drink alcohol, please do not consume more than 2 drinks per day. One drink is considered to be 12 ounces (355 mL) of beer, 5 ounces (148 mL) of wine, or 1.5 ounces (44 mL) of  liquor.  . If you are 73-59 years old, ask your health care provider if you should take aspirin to prevent strokes.  . Use sunscreen. Apply sunscreen liberally and repeatedly throughout the day. You should seek shade when your shadow is shorter than you. Protect yourself by wearing long sleeves, pants, a wide-brimmed hat, and sunglasses year round, whenever you are outdoors.  . Once a month, do a whole body skin exam, using a mirror to look at the skin on your back. Tell your health care provider of new moles, moles that have irregular borders, moles that are larger than a pencil eraser, or moles that have changed in shape or color.

## 2018-03-09 NOTE — Progress Notes (Signed)
I have reviewed and agree with note, evaluation, plan. Would recommend full PHQ9 at next AWV- have discussed with team.   [name], MD
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
## 2018-04-06 DIAGNOSIS — Z961 Presence of intraocular lens: Secondary | ICD-10-CM | POA: Diagnosis not present

## 2018-04-06 DIAGNOSIS — E119 Type 2 diabetes mellitus without complications: Secondary | ICD-10-CM | POA: Diagnosis not present

## 2018-04-06 DIAGNOSIS — H18453 Nodular corneal degeneration, bilateral: Secondary | ICD-10-CM | POA: Diagnosis not present

## 2018-04-06 DIAGNOSIS — H401132 Primary open-angle glaucoma, bilateral, moderate stage: Secondary | ICD-10-CM | POA: Diagnosis not present

## 2018-04-06 LAB — HM DIABETES EYE EXAM

## 2018-04-07 ENCOUNTER — Ambulatory Visit (INDEPENDENT_AMBULATORY_CARE_PROVIDER_SITE_OTHER): Payer: PPO

## 2018-04-07 DIAGNOSIS — E538 Deficiency of other specified B group vitamins: Secondary | ICD-10-CM | POA: Diagnosis not present

## 2018-04-07 MED ORDER — CYANOCOBALAMIN 1000 MCG/ML IJ SOLN
1000.0000 ug | Freq: Once | INTRAMUSCULAR | Status: AC
  Administered 2018-04-07: 1000 ug via INTRAMUSCULAR

## 2018-04-07 NOTE — Patient Instructions (Signed)
Health Maintenance Due  Topic Date Due  . TETANUS/TDAP  07/15/2016  . OPHTHALMOLOGY EXAM  01/13/2018  . INFLUENZA VACCINE  02/12/2018  . FOOT EXAM  03/05/2018    Depression screen [facility] 2/9 03/09/2018 02/02/2018 11/20/2017  Decreased Interest 0 0 0  Down, Depressed, Hopeless 0 0 0  PHQ - 2 Score 0 0 0  Altered sleeping - 1 1  Tired, decreased energy - 1 1  Change in appetite - 0 0  Feeling bad or failure about yourself  - 0 0  Trouble concentrating - 0 0  Moving slowly or fidgety/restless - 0 0  Suicidal thoughts - 0 0  PHQ-9 Score - 2 2  Difficult doing work/chores - Somewhat difficult Not difficult at all

## 2018-04-07 NOTE — Progress Notes (Signed)
Patient in today for B12 injection due to B12 deficiency. Administered in right arm. Tolerated well.

## 2018-04-23 ENCOUNTER — Other Ambulatory Visit: Payer: Self-pay | Admitting: Family Medicine

## 2018-04-23 DIAGNOSIS — C44619 Basal cell carcinoma of skin of left upper limb, including shoulder: Secondary | ICD-10-CM | POA: Diagnosis not present

## 2018-04-23 DIAGNOSIS — C44519 Basal cell carcinoma of skin of other part of trunk: Secondary | ICD-10-CM | POA: Diagnosis not present

## 2018-04-28 ENCOUNTER — Ambulatory Visit (INDEPENDENT_AMBULATORY_CARE_PROVIDER_SITE_OTHER): Payer: PPO

## 2018-04-28 ENCOUNTER — Ambulatory Visit: Payer: PPO

## 2018-04-28 DIAGNOSIS — Z23 Encounter for immunization: Secondary | ICD-10-CM

## 2018-05-07 ENCOUNTER — Other Ambulatory Visit: Payer: Self-pay

## 2018-05-07 MED ORDER — APIXABAN 5 MG PO TABS: 5.0000 mg | ORAL_TABLET | Freq: Two times a day (BID) | ORAL | 11 refills | Status: DC

## 2018-06-03 ENCOUNTER — Inpatient Hospital Stay (HOSPITAL_COMMUNITY): Payer: PPO

## 2018-06-03 ENCOUNTER — Encounter: Payer: Self-pay | Admitting: Family Medicine

## 2018-06-03 ENCOUNTER — Emergency Department (HOSPITAL_COMMUNITY): Payer: PPO

## 2018-06-03 ENCOUNTER — Other Ambulatory Visit: Payer: Self-pay

## 2018-06-03 ENCOUNTER — Encounter (HOSPITAL_COMMUNITY): Admission: EM | Disposition: A | Discharge status: Home / Self Care | Payer: Self-pay | Source: Home / Self Care

## 2018-06-03 ENCOUNTER — Encounter (HOSPITAL_COMMUNITY): Payer: Self-pay

## 2018-06-03 ENCOUNTER — Ambulatory Visit (INDEPENDENT_AMBULATORY_CARE_PROVIDER_SITE_OTHER): Payer: PPO | Admitting: Family Medicine

## 2018-06-03 ENCOUNTER — Inpatient Hospital Stay (HOSPITAL_COMMUNITY)
Admission: EM | Admit: 2018-06-03 | Discharge: 2018-06-09 | DRG: 393 | Disposition: A | Discharge status: Home / Self Care | Payer: PPO | Attending: General Surgery | Admitting: General Surgery

## 2018-06-03 VITALS — BP 116/80 | HR 85 | Temp 97.4°F | Ht 70.0 in | Wt 182.2 lb

## 2018-06-03 DIAGNOSIS — N183 Chronic kidney disease, stage 3 unspecified: Secondary | ICD-10-CM | POA: Diagnosis present

## 2018-06-03 DIAGNOSIS — D696 Thrombocytopenia, unspecified: Secondary | ICD-10-CM | POA: Diagnosis present

## 2018-06-03 DIAGNOSIS — G309 Alzheimer's disease, unspecified: Secondary | ICD-10-CM | POA: Diagnosis not present

## 2018-06-03 DIAGNOSIS — K631 Perforation of intestine (nontraumatic): Principal | ICD-10-CM | POA: Diagnosis present

## 2018-06-03 DIAGNOSIS — Z9841 Cataract extraction status, right eye: Secondary | ICD-10-CM

## 2018-06-03 DIAGNOSIS — F0280 Dementia in other diseases classified elsewhere without behavioral disturbance: Secondary | ICD-10-CM | POA: Diagnosis present

## 2018-06-03 DIAGNOSIS — N281 Cyst of kidney, acquired: Secondary | ICD-10-CM | POA: Diagnosis present

## 2018-06-03 DIAGNOSIS — K668 Other specified disorders of peritoneum: Secondary | ICD-10-CM | POA: Diagnosis not present

## 2018-06-03 DIAGNOSIS — E119 Type 2 diabetes mellitus without complications: Secondary | ICD-10-CM

## 2018-06-03 DIAGNOSIS — I10 Essential (primary) hypertension: Secondary | ICD-10-CM | POA: Diagnosis present

## 2018-06-03 DIAGNOSIS — I708 Atherosclerosis of other arteries: Secondary | ICD-10-CM | POA: Diagnosis not present

## 2018-06-03 DIAGNOSIS — I700 Atherosclerosis of aorta: Secondary | ICD-10-CM | POA: Diagnosis present

## 2018-06-03 DIAGNOSIS — F0150 Vascular dementia without behavioral disturbance: Secondary | ICD-10-CM | POA: Diagnosis present

## 2018-06-03 DIAGNOSIS — T50995A Adverse effect of other drugs, medicaments and biological substances, initial encounter: Secondary | ICD-10-CM | POA: Diagnosis not present

## 2018-06-03 DIAGNOSIS — Z7901 Long term (current) use of anticoagulants: Secondary | ICD-10-CM

## 2018-06-03 DIAGNOSIS — C183 Malignant neoplasm of hepatic flexure: Secondary | ICD-10-CM | POA: Diagnosis present

## 2018-06-03 DIAGNOSIS — Z833 Family history of diabetes mellitus: Secondary | ICD-10-CM

## 2018-06-03 DIAGNOSIS — Z85038 Personal history of other malignant neoplasm of large intestine: Secondary | ICD-10-CM | POA: Diagnosis present

## 2018-06-03 DIAGNOSIS — Z961 Presence of intraocular lens: Secondary | ICD-10-CM | POA: Diagnosis present

## 2018-06-03 DIAGNOSIS — Z8546 Personal history of malignant neoplasm of prostate: Secondary | ICD-10-CM

## 2018-06-03 DIAGNOSIS — Z87891 Personal history of nicotine dependence: Secondary | ICD-10-CM

## 2018-06-03 DIAGNOSIS — Z9049 Acquired absence of other specified parts of digestive tract: Secondary | ICD-10-CM

## 2018-06-03 DIAGNOSIS — N529 Male erectile dysfunction, unspecified: Secondary | ICD-10-CM | POA: Diagnosis present

## 2018-06-03 DIAGNOSIS — R198 Other specified symptoms and signs involving the digestive system and abdomen: Secondary | ICD-10-CM | POA: Diagnosis present

## 2018-06-03 DIAGNOSIS — I4891 Unspecified atrial fibrillation: Secondary | ICD-10-CM | POA: Diagnosis not present

## 2018-06-03 DIAGNOSIS — K659 Peritonitis, unspecified: Secondary | ICD-10-CM | POA: Diagnosis present

## 2018-06-03 DIAGNOSIS — R197 Diarrhea, unspecified: Secondary | ICD-10-CM | POA: Diagnosis not present

## 2018-06-03 DIAGNOSIS — R079 Chest pain, unspecified: Secondary | ICD-10-CM | POA: Diagnosis not present

## 2018-06-03 DIAGNOSIS — N1831 Chronic kidney disease, stage 3a: Secondary | ICD-10-CM | POA: Diagnosis present

## 2018-06-03 DIAGNOSIS — K219 Gastro-esophageal reflux disease without esophagitis: Secondary | ICD-10-CM | POA: Diagnosis not present

## 2018-06-03 DIAGNOSIS — Z801 Family history of malignant neoplasm of trachea, bronchus and lung: Secondary | ICD-10-CM

## 2018-06-03 DIAGNOSIS — R1084 Generalized abdominal pain: Secondary | ICD-10-CM

## 2018-06-03 DIAGNOSIS — E1122 Type 2 diabetes mellitus with diabetic chronic kidney disease: Secondary | ICD-10-CM | POA: Diagnosis present

## 2018-06-03 DIAGNOSIS — Z825 Family history of asthma and other chronic lower respiratory diseases: Secondary | ICD-10-CM

## 2018-06-03 DIAGNOSIS — I129 Hypertensive chronic kidney disease with stage 1 through stage 4 chronic kidney disease, or unspecified chronic kidney disease: Secondary | ICD-10-CM | POA: Diagnosis present

## 2018-06-03 DIAGNOSIS — Z85828 Personal history of other malignant neoplasm of skin: Secondary | ICD-10-CM

## 2018-06-03 DIAGNOSIS — K56699 Other intestinal obstruction unspecified as to partial versus complete obstruction: Secondary | ICD-10-CM | POA: Diagnosis present

## 2018-06-03 DIAGNOSIS — Z9842 Cataract extraction status, left eye: Secondary | ICD-10-CM

## 2018-06-03 DIAGNOSIS — R32 Unspecified urinary incontinence: Secondary | ICD-10-CM | POA: Diagnosis not present

## 2018-06-03 DIAGNOSIS — R140 Abdominal distension (gaseous): Secondary | ICD-10-CM | POA: Diagnosis not present

## 2018-06-03 DIAGNOSIS — R0602 Shortness of breath: Secondary | ICD-10-CM | POA: Diagnosis not present

## 2018-06-03 LAB — URINALYSIS, ROUTINE W REFLEX MICROSCOPIC
BILIRUBIN URINE: NEGATIVE
GLUCOSE, UA: NEGATIVE mg/dL
Ketones, ur: NEGATIVE mg/dL
LEUKOCYTES UA: NEGATIVE
NITRITE: NEGATIVE
PH: 5 (ref 5.0–8.0)
Protein, ur: 100 mg/dL — AB
SPECIFIC GRAVITY, URINE: 1.027 (ref 1.005–1.030)

## 2018-06-03 LAB — CBC WITH DIFFERENTIAL/PLATELET
Abs Immature Granulocytes: 0 10*3/uL (ref 0.00–0.07)
Basophils Absolute: 0 10*3/uL (ref 0.0–0.1)
Basophils Relative: 0 %
EOS ABS: 0 10*3/uL (ref 0.0–0.5)
EOS PCT: 0 %
HEMATOCRIT: 41.7 % (ref 39.0–52.0)
HEMOGLOBIN: 12.9 g/dL — AB (ref 13.0–17.0)
LYMPHS ABS: 2.4 10*3/uL (ref 0.7–4.0)
LYMPHS PCT: 17 %
MCH: 24.6 pg — ABNORMAL LOW (ref 26.0–34.0)
MCHC: 30.9 g/dL (ref 30.0–36.0)
MCV: 79.4 fL — AB (ref 80.0–100.0)
MONOS PCT: 2 %
Monocytes Absolute: 0.3 10*3/uL (ref 0.1–1.0)
NEUTROS PCT: 81 %
NRBC: 0 /100{WBCs}
Neutro Abs: 11.7 10*3/uL — ABNORMAL HIGH (ref 1.7–7.7)
Platelets: 156 10*3/uL (ref 150–400)
RBC: 5.25 MIL/uL (ref 4.22–5.81)
RDW: 23.3 % — AB (ref 11.5–15.5)
WBC: 14.4 10*3/uL — ABNORMAL HIGH (ref 4.0–10.5)
nRBC: 0 % (ref 0.0–0.2)

## 2018-06-03 LAB — COMPREHENSIVE METABOLIC PANEL
ALT: 33 U/L (ref 0–44)
ANION GAP: 10 (ref 5–15)
AST: 32 U/L (ref 15–41)
Albumin: 3.7 g/dL (ref 3.5–5.0)
Alkaline Phosphatase: 50 U/L (ref 38–126)
BUN: 26 mg/dL — ABNORMAL HIGH (ref 8–23)
CALCIUM: 9.1 mg/dL (ref 8.9–10.3)
CHLORIDE: 106 mmol/L (ref 98–111)
CO2: 22 mmol/L (ref 22–32)
Creatinine, Ser: 1.76 mg/dL — ABNORMAL HIGH (ref 0.61–1.24)
GFR calc non Af Amer: 34 mL/min — ABNORMAL LOW (ref >60)
GFR, EST AFRICAN AMERICAN: 40 mL/min — AB (ref >60)
GLUCOSE: 242 mg/dL — AB (ref 70–99)
POTASSIUM: 3.9 mmol/L (ref 3.5–5.1)
SODIUM: 138 mmol/L (ref 135–145)
Total Bilirubin: 1.4 mg/dL — ABNORMAL HIGH (ref 0.3–1.2)
Total Protein: 7 g/dL (ref 6.5–8.1)

## 2018-06-03 LAB — I-STAT CG4 LACTIC ACID, ED
LACTIC ACID, VENOUS: 2.15 mmol/L — AB (ref 0.5–1.9)
LACTIC ACID, VENOUS: 3.91 mmol/L — AB (ref 0.5–1.9)

## 2018-06-03 LAB — SURGICAL PCR SCREEN
MRSA, PCR: NEGATIVE
STAPHYLOCOCCUS AUREUS: NEGATIVE

## 2018-06-03 LAB — LIPASE, BLOOD: Lipase: 26 U/L (ref 11–51)

## 2018-06-03 SURGERY — LAPAROTOMY, EXPLORATORY
Anesthesia: General

## 2018-06-03 MED ORDER — ACETAMINOPHEN 325 MG PO TABS
325.0000 mg | ORAL_TABLET | Freq: Once | ORAL | Status: DC
  Filled 2018-06-03: qty 1

## 2018-06-03 MED ORDER — SODIUM CHLORIDE 0.9 % IV BOLUS
500.0000 mL | Freq: Once | INTRAVENOUS | Status: AC
  Administered 2018-06-03: 500 mL via INTRAVENOUS

## 2018-06-03 MED ORDER — METOPROLOL SUCCINATE 12.5 MG HALF TABLET
12.5000 mg | ORAL_TABLET | Freq: Every day | ORAL | Status: DC
  Administered 2018-06-03 – 2018-06-09 (×7): 12.5 mg via ORAL
  Filled 2018-06-03 (×8): qty 1

## 2018-06-03 MED ORDER — LACTATED RINGERS IV BOLUS: 1000.0000 mL | Freq: Three times a day (TID) | INTRAVENOUS | Status: AC | PRN

## 2018-06-03 MED ORDER — METOPROLOL TARTRATE 5 MG/5ML IV SOLN: 5.0000 mg | Freq: Four times a day (QID) | INTRAVENOUS | Status: DC | PRN

## 2018-06-03 MED ORDER — PIPERACILLIN-TAZOBACTAM 3.375 G IVPB
3.3750 g | Freq: Once | INTRAVENOUS | Status: DC
  Administered 2018-06-03: 3.375 g via INTRAVENOUS
  Filled 2018-06-03: qty 50

## 2018-06-03 MED ORDER — ACETAMINOPHEN 650 MG RE SUPP
650.0000 mg | Freq: Once | RECTAL | Status: AC
  Administered 2018-06-03: 650 mg via RECTAL
  Filled 2018-06-03: qty 1

## 2018-06-03 MED ORDER — KCL IN DEXTROSE-NACL 20-5-0.45 MEQ/L-%-% IV SOLN
INTRAVENOUS | Status: DC
  Administered 2018-06-03 – 2018-06-05 (×3): via INTRAVENOUS
  Filled 2018-06-03 (×7): qty 1000

## 2018-06-03 MED ORDER — DIPHENHYDRAMINE HCL 12.5 MG/5ML PO ELIX: 12.5000 mg | ORAL_SOLUTION | Freq: Four times a day (QID) | ORAL | Status: DC | PRN

## 2018-06-03 MED ORDER — HYDRALAZINE HCL 20 MG/ML IJ SOLN: 10.0000 mg | INTRAMUSCULAR | Status: DC | PRN

## 2018-06-03 MED ORDER — IOHEXOL 300 MG/ML  SOLN
100.0000 mL | Freq: Once | INTRAMUSCULAR | Status: AC | PRN
  Administered 2018-06-03: 100 mL via INTRAVENOUS

## 2018-06-03 MED ORDER — ACETAMINOPHEN 500 MG PO TABS: 1000.0000 mg | ORAL_TABLET | Freq: Three times a day (TID) | ORAL | Status: DC

## 2018-06-03 MED ORDER — MUPIROCIN 2 % EX OINT
1.0000 "application " | TOPICAL_OINTMENT | Freq: Two times a day (BID) | CUTANEOUS | Status: AC
  Administered 2018-06-03 – 2018-06-05 (×3): 1 via NASAL
  Filled 2018-06-03: qty 22

## 2018-06-03 MED ORDER — SODIUM CHLORIDE 0.9 % IV SOLN
8.0000 mg | Freq: Four times a day (QID) | INTRAVENOUS | Status: DC | PRN
  Filled 2018-06-03: qty 4

## 2018-06-03 MED ORDER — MORPHINE SULFATE (PF) 2 MG/ML IV SOLN: 1.0000 mg | INTRAVENOUS | Status: DC | PRN

## 2018-06-03 MED ORDER — PROTHROMBIN COMPLEX CONC HUMAN 500 UNITS IV KIT
4400.0000 [IU] | PACK | Status: AC
  Administered 2018-06-03: 4400 [IU] via INTRAVENOUS
  Filled 2018-06-03: qty 4400

## 2018-06-03 MED ORDER — LIP MEDEX EX OINT
1.0000 "application " | TOPICAL_OINTMENT | Freq: Two times a day (BID) | CUTANEOUS | Status: DC
  Administered 2018-06-03 – 2018-06-09 (×10): 1 via TOPICAL
  Filled 2018-06-03 (×2): qty 7

## 2018-06-03 MED ORDER — LACTATED RINGERS IV BOLUS
1000.0000 mL | Freq: Once | INTRAVENOUS | Status: AC
  Administered 2018-06-03: 1000 mL via INTRAVENOUS

## 2018-06-03 MED ORDER — ONDANSETRON HCL 4 MG/2ML IJ SOLN: 4.0000 mg | Freq: Four times a day (QID) | INTRAMUSCULAR | Status: DC | PRN

## 2018-06-03 MED ORDER — ONDANSETRON HCL 4 MG/2ML IJ SOLN
4.0000 mg | Freq: Once | INTRAMUSCULAR | Status: AC
  Administered 2018-06-03: 4 mg via INTRAVENOUS
  Filled 2018-06-03: qty 2

## 2018-06-03 MED ORDER — DIPHENHYDRAMINE HCL 50 MG/ML IJ SOLN
12.5000 mg | Freq: Four times a day (QID) | INTRAMUSCULAR | Status: DC | PRN
  Administered 2018-06-04 – 2018-06-08 (×4): 12.5 mg via INTRAVENOUS
  Filled 2018-06-03 (×4): qty 1

## 2018-06-03 MED ORDER — MAGIC MOUTHWASH
15.0000 mL | Freq: Four times a day (QID) | ORAL | Status: DC | PRN
  Administered 2018-06-08 – 2018-06-09 (×2): 15 mL via ORAL
  Filled 2018-06-03 (×3): qty 15

## 2018-06-03 MED ORDER — HYDROMORPHONE HCL 1 MG/ML IJ SOLN
0.5000 mg | Freq: Once | INTRAMUSCULAR | Status: AC
  Administered 2018-06-03: 0.5 mg via INTRAVENOUS
  Filled 2018-06-03: qty 1

## 2018-06-03 MED ORDER — PIPERACILLIN-TAZOBACTAM 3.375 G IVPB
3.3750 g | Freq: Three times a day (TID) | INTRAVENOUS | Status: DC
  Administered 2018-06-03 – 2018-06-09 (×17): 3.375 g via INTRAVENOUS
  Filled 2018-06-03 (×20): qty 50

## 2018-06-03 MED ORDER — SODIUM CHLORIDE 0.9 % IV SOLN: INTRAVENOUS | Status: DC

## 2018-06-03 MED ORDER — ONDANSETRON 4 MG PO TBDP: 4.0000 mg | ORAL_TABLET | Freq: Four times a day (QID) | ORAL | Status: DC | PRN

## 2018-06-03 MED ORDER — PANTOPRAZOLE SODIUM 40 MG IV SOLR
40.0000 mg | Freq: Every day | INTRAVENOUS | Status: DC
  Administered 2018-06-03 – 2018-06-07 (×5): 40 mg via INTRAVENOUS
  Filled 2018-06-03 (×5): qty 40

## 2018-06-03 MED ORDER — METHOCARBAMOL 1000 MG/10ML IJ SOLN
500.0000 mg | Freq: Three times a day (TID) | INTRAVENOUS | Status: DC
  Administered 2018-06-04 – 2018-06-08 (×13): 500 mg via INTRAVENOUS
  Filled 2018-06-03 (×17): qty 5

## 2018-06-03 NOTE — ED Notes (Addendum)
Patient is quivering, HR:140Afib, Resp:32; BP: 171/102, O2: down to 88% RA, 4Lit [place] applied but O2 continues to go down to 75% on 4Lit [place], a NRB mask applied; patient is alert family at bedside. EKG completed. EDP at bedside.

## 2018-06-03 NOTE — Progress Notes (Signed)
Patient: Calvin Burnett MRN: [id number] DOB: [birth year] PCP: Marin Olp, MD     Subjective:  Chief Complaint  Patient presents with  . Abdominal Pain    started at 6 pm last night    HPI: The patient is a 84 y.o. male who presents today for abdominal pain in his stomach that started last night. He ate 2 sandwiches for dinner (roast beef and a [place]) that he states were in date. He ate dinner around 6pm and he had abdominal pain immediately following eating. He was up all night with the pain. He had 2 episodes of diarrhea during the night that were green in color. No blood in his stool. He has had no episodes of diarrhea today. He states the abdominal pain is a little better today, but not much. He has taken pepto bismol. Pain was a 10/10 last night and he is a 7/10 right now. He describes the pain as sharp and can hardly touch his stomach right now. No fever/chills. NO one else ate the same food and he denies anyone being sick with a GI bug. He is able to drink fluids fine. He has not eaten anything this AM. Hx of afib. On eliquis.   Review of Systems  Constitutional: Negative for chills and fever.  Respiratory: Negative for shortness of breath.   Cardiovascular: Negative for chest pain.  Gastrointestinal: Positive for abdominal pain. Negative for blood in stool, constipation, nausea and vomiting.  Musculoskeletal: Negative for back pain.  Skin: Negative.   Neurological: Negative for dizziness and headaches.    Allergies Patient has No Known Allergies.  Past Medical History Patient  has a past medical history of Diabetes mellitus, ED (erectile dysfunction), GERD (gastroesophageal reflux disease), History of basal cell carcinoma excision, History of cerebral parenchymal hemorrhage, Hypertension, and Prostate cancer ([place]) (08/12/12).  Surgical History Patient  has a past surgical history that includes Prostate biopsy (08/12/12); Appendectomy (age 69); Cholecystectomy (1980);  Cataract extraction w/ intraocular lens  implant, bilateral; Radioactive seed implant (N/A, 12/03/2012); XI robotic assisted lower anterior resection (N/A, 11/26/2017); Proctoscopy (N/A, 11/26/2017); and Colonoscopy (N/A, 11/26/2017).  Family History Pateint's family history includes COPD in his mother; Cancer in his daughter; Diabetes in his daughter and sister; Emphysema in his mother; Lung cancer in his father.  Social History Patient  reports that he quit smoking about 27 years ago. His smoking use included cigars. He has a 2.00 pack-year smoking history. He has never used smokeless tobacco. He reports that he does not drink alcohol or use drugs.    Objective: Vitals:   06/03/18 0910  BP: 116/80  Pulse: 85  Temp: (!) 97.4 F (36.3 C)  TempSrc: Oral  SpO2: 95%  Weight: 182 lb 3.2 oz (82.6 kg)  Height: 5\' 10"  (1.778 m)    Body mass index is 26.14 kg/m.  Physical Exam  Constitutional: He appears well-developed and well-nourished.  Non-toxic appearance.  Cardiovascular: Normal rate.  Pulmonary/Chest: Effort normal and breath sounds normal.  Abdominal: He exhibits no shifting dullness, no ascites and no mass. Bowel sounds are decreased. There is generalized tenderness. There is guarding. There is no rebound.  Vitals reviewed.      Assessment/plan: 1. Diffuse abdominal pain Acute abdomen with concern for acute mesenteric ischemia. Hx of a fib and pain out of proportion. Discussed with son. We were going to do everything stat outpatient, but when I went back in patient stated pain was 10/10 and I sent them to ER for emergent  care. Discussed this is an emergency if he has this and they were going straight to ER.    Return if symptoms worsen or fail to improve.   Orma Flaming, MD [place]   06/03/2018
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
## 2018-06-03 NOTE — ED Provider Notes (Addendum)
Medical screening examination/treatment/procedure(s) were conducted as a shared visit with non-physician practitioner(s) and myself.  I personally evaluated the patient during the encounter.  EKG Interpretation  Date/Time:  Wednesday June 03 2018 13:07:19 EST Ventricular Rate:  70 PR Interval:    QRS Duration: 98 QT Interval:  372 QTC Calculation: 401 R Axis:   98 Text Interpretation:  Atrial fibrillation Rightward axis Abnormal ECG no change from previous. no acute ischemic changes Confirmed by Charlesetta Shanks [phone]) on 06/03/2018 3:49:37 PM    Patient has had several days of abdominal pain.  He reports that seemed sort of gradual in onset but now has gotten pretty severe.  He has not had any vomiting or fever that he is aware of.  Patient does take Eliquis.  He reports last dose would have been yesterday evening.  Patient is alert and appropriate.  No respiratory distress.  He does have guarding on abdominal exam.  CT scan shows viscous perforation.  Antibiotics will be initiated and surgery consulted.  I agree with plan of management.    Charlesetta Shanks, MD 06/03/18 1301 Nursing staff has just advised me that the patient became very red and short of breath within 5 minutes of starting Kcentra.  I have assessed the patient and his color is normal.  But report is that he was read all over.  Patient reports that he suddenly felt like he could not breathe.  He does feel like that is abating now.  The Kcentra had been turned off immediately by nursing staff once this was observed.  Patient's mental status remains clear.  Heart is irregularly irregular.  There is slight crackle at the base on the left.  Right lung is clear.  No wheezing.  Oxygen saturation is 90 to 95% on room air.  Will obtain chest x-ray, repeat EKG and add supplemental oxygen.  Pharmacy will notify Dr. Hulen Skains that patient is not tolerating Kcentra.  At this time, do not have other immediate cause for the patient's  symptoms.  Symptoms are resolving as soon as it was discontinued.  CRITICAL CARE Performed by: Charlesetta Shanks   Total critical care time: 20  minutes  Critical care time was exclusive of separately billable procedures and treating other patients.  Critical care was necessary to treat or prevent imminent or life-threatening deterioration.  Critical care was time spent personally by me on the following activities: development of treatment plan with patient and/or surrogate as well as nursing, discussions with consultants, evaluation of patient's response to treatment, examination of patient, obtaining history from patient or surrogate, ordering and performing treatments and interventions, ordering and review of laboratory studies, ordering and review of radiographic studies, pulse oximetry and re-evaluation of patient's condition.   Charlesetta Shanks, MD 06/03/18 [phone]

## 2018-06-03 NOTE — ED Notes (Signed)
Patient transported to CT 

## 2018-06-03 NOTE — Progress Notes (Signed)
I just re-examined the patient and his is definitely  Improved.  Much less abdominal tenderness, but not gone.  His HR is down to 108, BP good.  It seems as though at least some of his physiological derangement was secondary to his reaction to the [place].  He is much better now.  Kathryne Eriksson. Dahlia Bailiff, MD, [place] [phone] [facility] Surgery

## 2018-06-03 NOTE — ED Notes (Addendum)
Dr. Hulen Skains at bedside with patient and family
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
## 2018-06-03 NOTE — ED Triage Notes (Signed)
Per pt: Pts PCP sent pt to ED. Pt is having pain in stomach. Pt has been having nausea, vomiting, diarrhea since yesterday.

## 2018-06-03 NOTE — Progress Notes (Addendum)
Called STAT to bedside d/t "allergic reaction".  Noted pt to be shaking, tachypnea.  [place] increased to 5 lpm [place].  Sat difficult to obtain initially d/t shaking but eventually was 97-98% w/ good waveform.  Pt appeared to be calming down, less tachypnic, less shaking.  Dr Hulen Skains at bedside now.  No distress currently, pt denies SOB currently.   BBSH coarse t/o w/ good air movement, no wheeze, no stridor noted.

## 2018-06-03 NOTE — ED Notes (Addendum)
Patient's family called staff to report patient is having shortness of breath, patient's skin appears red as he is working to breath, KCENTRA discontinued. EDP at bedside. Pharmacist at bedside  Surgery Team notified

## 2018-06-03 NOTE — ED Notes (Signed)
Dr Hulen Skains @ bedside

## 2018-06-04 LAB — COMPREHENSIVE METABOLIC PANEL
ALBUMIN: 3 g/dL — AB (ref 3.5–5.0)
ALT: 27 U/L (ref 0–44)
AST: 28 U/L (ref 15–41)
Alkaline Phosphatase: 43 U/L (ref 38–126)
Anion gap: 6 (ref 5–15)
BILIRUBIN TOTAL: 1.4 mg/dL — AB (ref 0.3–1.2)
BUN: 23 mg/dL (ref 8–23)
CO2: 24 mmol/L (ref 22–32)
CREATININE: 1.84 mg/dL — AB (ref 0.61–1.24)
Calcium: 8 mg/dL — ABNORMAL LOW (ref 8.9–10.3)
Chloride: 107 mmol/L (ref 98–111)
GFR calc Af Amer: 38 mL/min — ABNORMAL LOW (ref >60)
GFR, EST NON AFRICAN AMERICAN: 32 mL/min — AB (ref >60)
GLUCOSE: 204 mg/dL — AB (ref 70–99)
POTASSIUM: 4.1 mmol/L (ref 3.5–5.1)
Sodium: 137 mmol/L (ref 135–145)
TOTAL PROTEIN: 6.1 g/dL — AB (ref 6.5–8.1)

## 2018-06-04 LAB — CBC
HCT: 35.1 % — ABNORMAL LOW (ref 39.0–52.0)
Hemoglobin: 11 g/dL — ABNORMAL LOW (ref 13.0–17.0)
MCH: 25.1 pg — AB (ref 26.0–34.0)
MCHC: 31.3 g/dL (ref 30.0–36.0)
MCV: 80 fL (ref 80.0–100.0)
PLATELETS: 100 10*3/uL — AB (ref 150–400)
RBC: 4.39 MIL/uL (ref 4.22–5.81)
RDW: 23.5 % — AB (ref 11.5–15.5)
WBC: 9.1 10*3/uL (ref 4.0–10.5)
nRBC: 0 % (ref 0.0–0.2)

## 2018-06-04 MED ORDER — SODIUM CHLORIDE 0.9 % IV BOLUS
500.0000 mL | Freq: Once | INTRAVENOUS | Status: AC
  Administered 2018-06-04: 500 mL via INTRAVENOUS

## 2018-06-04 NOTE — Progress Notes (Signed)
Subjective/Chief Complaint: Patient reports abdominal pain much less Passing flatus   Objective: Vital signs in last 24 hours: Temp:  [97.4 F (36.3 C)-103.5 F (39.7 C)] 99 F (37.2 C) (11/21 0400) Pulse Rate:  [67-150] 93 (11/20 2000) Resp:  [14-35] 15 (11/20 2000) BP: (100-171)/(49-151) 107/64 (11/20 2300) SpO2:  [90 %-100 %] 97 % (11/20 2300) Weight:  [82.1 kg-82.6 kg] 82.1 kg (11/20 2000)    Intake/Output from previous day: 11/20 0701 - 11/21 0700 In: -  Out: 50 [Urine:50] Intake/Output this shift: No intake/output data recorded.  Exam: Looks comfortable Abdomen soft, moderately tender with guarding in right lower quadrant  Lab Results:  Recent Labs    06/03/18 1026 06/04/18 0412  WBC 14.4* 9.1  HGB 12.9* 11.0*  HCT 41.7 35.1*  PLT 156 100*   BMET Recent Labs    06/03/18 1026 06/04/18 0412  NA 138 137  K 3.9 4.1  CL 106 107  CO2 22 24  GLUCOSE 242* 204*  BUN 26* 23  CREATININE 1.76* 1.84*  CALCIUM 9.1 8.0*   PT/INR No results for input(s): LABPROT, INR in the last 72 hours. ABG No results for input(s): PHART, HCO3 in the last 72 hours.  Invalid input(s): PCO2, PO2  Studies/Results: Dg Chest 1 View  Result Date: 06/03/2018 CLINICAL DATA:  Chest pain and shortness of breath EXAM: CHEST  1 VIEW COMPARISON:  CT chest from 11/29/2017 and overlapping portions of CT abdomen from 06/03/2018 FINDINGS: Mild cardiomegaly noted with bandlike scarring or atelectasis at the left lung base. There is free intraperitoneal gas beneath the right hemidiaphragm. Atherosclerotic calcification of the aortic arch. The lungs appear otherwise clear. IMPRESSION: 1. Free intraperitoneal gas, as shown in the CT scan from 12:43 p.m. 2. Mild atelectasis or scarring at the left lung base. 3. Mild cardiomegaly. 4.  Aortic Atherosclerosis (ICD10-I70.0). Electronically Signed   By: Van Clines M.D.   On: 06/03/2018 16:44   Ct Abdomen Pelvis W Contrast  Result Date:  06/03/2018 CLINICAL DATA:  pain in his stomach that started last night. He ate 2 sandwiches for dinner (roast beef and a [place]) that he states were in date. He ate dinner around 6pm and he had abdominal pain immediately following eating. He was up all night with the pain. He had 2 episodes of diarrhea during the night that were green in color. EXAM: CT ABDOMEN AND PELVIS WITH CONTRAST TECHNIQUE: Multidetector CT imaging of the abdomen and pelvis was performed using the standard protocol following bolus administration of intravenous contrast. CONTRAST:  137mL OMNIPAQUE IOHEXOL 300 MG/ML  SOLN COMPARISON:  06/10/2017 FINDINGS: Lower chest: Scattered coronary calcifications. No pleural or pericardial effusion. Hepatobiliary: No focal liver abnormality is seen. Status post cholecystectomy. No biliary dilatation. Pancreas: Unremarkable. No pancreatic ductal dilatation or surrounding inflammatory changes. Spleen: Normal in size without focal abnormality. Adrenals/Urinary Tract: Normal adrenals. Subcentimeter probable renal cysts bilaterally. Bilateral parapelvic renal cysts as before. No hydronephrosis. No renal mass. Urinary bladder incompletely distended. Stomach/Bowel: Stomach incompletely distended, unremarkable. Duodenal bulb and sweep unremarkable. Small bowel is nondilated. Staple line noted in the distal ileum. Changes of partial right hemicolectomy and sigmoidectomy, with metallic staple lines. Scattered diverticula in the splenic flexure and distal descending segment. Vascular/Lymphatic: Partially calcified moderate aortoiliac atheromatous plaque without aneurysm or high-grade stenosis. No abdominal or pelvic adenopathy. Circumaortic left renal vein, an anatomic variant. Portal vein patent. Reproductive: Multiple metallic seeds in and around the prostate. Other: There is a small amount of ascites anteriorly in  the right lower quadrant. Multiple scattered bubbles of free intraperitoneal gas throughout the  peritoneal cavity. Musculoskeletal: Mild spondylitic change in the lower lumbar spine. No fracture or worrisome bone lesion. IMPRESSION: 1. Scattered bubbles of free intraperitoneal gas consistent with perforated viscus. 2. Small amount of abnormal peritoneal fluid in the anterior right lower quadrant but no other suggestion of exact site of presumed bowel perforation. Critical Value/emergent results were called by telephone at the time of interpretation on 06/03/2018 at 1:04 pm to Dr. Carlisle Cater , who verbally acknowledged these results. 3. Postop changes in the small bowel and colon as above. 4. coronary and aortoiliac  atherosclerosis (ICD10-170.0). Electronically Signed   By: Lucrezia Europe M.D.   On: 06/03/2018 13:04    Anti-infectives: Anti-infectives (From admission, onward)   Start     Dose/Rate Route Frequency Ordered Stop   06/03/18 2000  piperacillin-tazobactam (ZOSYN) IVPB 3.375 g     3.375 g 12.5 mL/hr over 240 Minutes Intravenous Every 8 hours 06/03/18 1542     06/03/18 1300  piperacillin-tazobactam (ZOSYN) IVPB 3.375 g  Status:  Discontinued     3.375 g 12.5 mL/hr over 240 Minutes Intravenous  Once 06/03/18 1247 06/03/18 1731      Assessment/Plan:   LOS: 1 day   Bowel microperforation, uncertain location  Clinically continues to improve.  WBC now normal. Cr slightly up  Will continue bowel rest, IV antibiotics and supportive measures in hopes of avoiding a laparotomy. I discussed this with the patient and his family who agree with the plan  Ezana Hubbert A 06/04/2018

## 2018-06-05 LAB — BASIC METABOLIC PANEL
Anion gap: 5 (ref 5–15)
BUN: 16 mg/dL (ref 8–23)
CALCIUM: 7.6 mg/dL — AB (ref 8.9–10.3)
CO2: 20 mmol/L — ABNORMAL LOW (ref 22–32)
Chloride: 110 mmol/L (ref 98–111)
Creatinine, Ser: 1.59 mg/dL — ABNORMAL HIGH (ref 0.61–1.24)
GFR calc Af Amer: 45 mL/min — ABNORMAL LOW (ref >60)
GFR, EST NON AFRICAN AMERICAN: 39 mL/min — AB (ref >60)
GLUCOSE: 194 mg/dL — AB (ref 70–99)
Potassium: 3.6 mmol/L (ref 3.5–5.1)
SODIUM: 135 mmol/L (ref 135–145)

## 2018-06-05 LAB — CBC
HCT: 34.8 % — ABNORMAL LOW (ref 39.0–52.0)
Hemoglobin: 10.6 g/dL — ABNORMAL LOW (ref 13.0–17.0)
MCH: 24.4 pg — ABNORMAL LOW (ref 26.0–34.0)
MCHC: 30.5 g/dL (ref 30.0–36.0)
MCV: 80 fL (ref 80.0–100.0)
PLATELETS: 93 10*3/uL — AB (ref 150–400)
RBC: 4.35 MIL/uL (ref 4.22–5.81)
RDW: 23.1 % — AB (ref 11.5–15.5)
WBC: 8.8 10*3/uL (ref 4.0–10.5)
nRBC: 0 % (ref 0.0–0.2)

## 2018-06-05 NOTE — Progress Notes (Signed)
Subjective: He says he is clearly feeling better, passing flatus and having loose stools but still has right-sided pain. WBC 8800.  Hemoglobin 10.6 stable.  Creatinine 1.59 stable.  Potassium 3.6 Objective: Vital signs in last 24 hours: Temp:  [97.8 F (36.6 C)-100.7 F (38.2 C)] 98.3 F (36.8 C) (11/22 0758) Pulse Rate:  [84-94] 88 (11/22 0758) Resp:  [16-25] 16 (11/22 0758) BP: (121-140)/(76-94) 140/94 (11/22 0758) SpO2:  [93 %-96 %] 94 % (11/22 0758)    Intake/Output from previous day: 11/21 0701 - 11/22 0700 In: 1500.4 [I.V.:1000.3; IV Piggyback:500.1] Out: -  Intake/Output this shift: No intake/output data recorded.  General appearance: Alert.  Pleasant.  Mental status normal.  Appears comfortable. Resp: clear to auscultation bilaterally GI: Soft with some bowel sounds.  Still tender with some guarding on right side  Lab Results:  Recent Labs    06/04/18 0412 06/05/18 0527  WBC 9.1 8.8  HGB 11.0* 10.6*  HCT 35.1* 34.8*  PLT 100* 93*   BMET Recent Labs    06/04/18 0412 06/05/18 0527  NA 137 135  K 4.1 3.6  CL 107 110  CO2 24 20*  GLUCOSE 204* 194*  BUN 23 16  CREATININE 1.84* 1.59*  CALCIUM 8.0* 7.6*   PT/INR No results for input(s): LABPROT, INR in the last 72 hours. ABG No results for input(s): PHART, HCO3 in the last 72 hours.  Invalid input(s): PCO2, PO2  Studies/Results: Dg Chest 1 View  Result Date: 06/03/2018 CLINICAL DATA:  Chest pain and shortness of breath EXAM: CHEST  1 VIEW COMPARISON:  CT chest from 11/29/2017 and overlapping portions of CT abdomen from 06/03/2018 FINDINGS: Mild cardiomegaly noted with bandlike scarring or atelectasis at the left lung base. There is free intraperitoneal gas beneath the right hemidiaphragm. Atherosclerotic calcification of the aortic arch. The lungs appear otherwise clear. IMPRESSION: 1. Free intraperitoneal gas, as shown in the CT scan from 12:43 p.m. 2. Mild atelectasis or scarring at the left lung  base. 3. Mild cardiomegaly. 4.  Aortic Atherosclerosis (ICD10-I70.0). Electronically Signed   By: Van Clines M.D.   On: 06/03/2018 16:44   Ct Abdomen Pelvis W Contrast  Result Date: 06/03/2018 CLINICAL DATA:  pain in his stomach that started last night. He ate 2 sandwiches for dinner (roast beef and a [place]) that he states were in date. He ate dinner around 6pm and he had abdominal pain immediately following eating. He was up all night with the pain. He had 2 episodes of diarrhea during the night that were green in color. EXAM: CT ABDOMEN AND PELVIS WITH CONTRAST TECHNIQUE: Multidetector CT imaging of the abdomen and pelvis was performed using the standard protocol following bolus administration of intravenous contrast. CONTRAST:  159mL OMNIPAQUE IOHEXOL 300 MG/ML  SOLN COMPARISON:  06/10/2017 FINDINGS: Lower chest: Scattered coronary calcifications. No pleural or pericardial effusion. Hepatobiliary: No focal liver abnormality is seen. Status post cholecystectomy. No biliary dilatation. Pancreas: Unremarkable. No pancreatic ductal dilatation or surrounding inflammatory changes. Spleen: Normal in size without focal abnormality. Adrenals/Urinary Tract: Normal adrenals. Subcentimeter probable renal cysts bilaterally. Bilateral parapelvic renal cysts as before. No hydronephrosis. No renal mass. Urinary bladder incompletely distended. Stomach/Bowel: Stomach incompletely distended, unremarkable. Duodenal bulb and sweep unremarkable. Small bowel is nondilated. Staple line noted in the distal ileum. Changes of partial right hemicolectomy and sigmoidectomy, with metallic staple lines. Scattered diverticula in the splenic flexure and distal descending segment. Vascular/Lymphatic: Partially calcified moderate aortoiliac atheromatous plaque without aneurysm or high-grade stenosis. No abdominal or  pelvic adenopathy. Circumaortic left renal vein, an anatomic variant. Portal vein patent. Reproductive: Multiple  metallic seeds in and around the prostate. Other: There is a small amount of ascites anteriorly in the right lower quadrant. Multiple scattered bubbles of free intraperitoneal gas throughout the peritoneal cavity. Musculoskeletal: Mild spondylitic change in the lower lumbar spine. No fracture or worrisome bone lesion. IMPRESSION: 1. Scattered bubbles of free intraperitoneal gas consistent with perforated viscus. 2. Small amount of abnormal peritoneal fluid in the anterior right lower quadrant but no other suggestion of exact site of presumed bowel perforation. Critical Value/emergent results were called by telephone at the time of interpretation on 06/03/2018 at 1:04 pm to Dr. Carlisle Cater , who verbally acknowledged these results. 3. Postop changes in the small bowel and colon as above. 4. coronary and aortoiliac  atherosclerosis (ICD10-170.0). Electronically Signed   By: Lucrezia Europe M.D.   On: 06/03/2018 13:04    Anti-infectives: Anti-infectives (From admission, onward)   Start     Dose/Rate Route Frequency Ordered Stop   06/03/18 2000  piperacillin-tazobactam (ZOSYN) IVPB 3.375 g     3.375 g 12.5 mL/hr over 240 Minutes Intravenous Every 8 hours 06/03/18 1542     06/03/18 1300  piperacillin-tazobactam (ZOSYN) IVPB 3.375 g  Status:  Discontinued     3.375 g 12.5 mL/hr over 240 Minutes Intravenous  Once 06/03/18 1247 06/03/18 1731      Assessment/Plan: s/p Procedure(s): EXPLORATORY LAPAROTOMY  Bowel microperforation without abscess.  Location uncertain. Continues to improve. Leukocytosis resolved  Continue bowel rest, IV antibiotics and supportive measures in hopes of avoiding a laparotomy Would like to see him less tender before feeding.  He agrees   LOS: 2 days    [name] 06/05/2018

## 2018-06-05 NOTE — Care Management Important Message (Signed)
Important Message  Patient Details  Name: Calvin Burnett MRN: [id number] Date of Birth: [birth year]   Medicare Important Message Given:  Yes    Orbie Pyo 06/05/2018, 3:16 PM

## 2018-06-06 ENCOUNTER — Inpatient Hospital Stay (HOSPITAL_COMMUNITY): Payer: PPO

## 2018-06-06 LAB — CBC
HEMATOCRIT: 39.2 % (ref 39.0–52.0)
Hemoglobin: 12.5 g/dL — ABNORMAL LOW (ref 13.0–17.0)
MCH: 25 pg — ABNORMAL LOW (ref 26.0–34.0)
MCHC: 31.9 g/dL (ref 30.0–36.0)
MCV: 78.2 fL — ABNORMAL LOW (ref 80.0–100.0)
NRBC: 0 % (ref 0.0–0.2)
Platelets: 137 10*3/uL — ABNORMAL LOW (ref 150–400)
RBC: 5.01 MIL/uL (ref 4.22–5.81)
RDW: 22.5 % — AB (ref 11.5–15.5)
WBC: 10 10*3/uL (ref 4.0–10.5)

## 2018-06-06 LAB — C DIFFICILE QUICK SCREEN W PCR REFLEX
C Diff antigen: NEGATIVE
C Diff interpretation: NOT DETECTED
C Diff toxin: NEGATIVE

## 2018-06-06 NOTE — Progress Notes (Signed)
Patient ID: Calvin Burnett, male   DOB: [birth year], 84 y.o.   MRN: [id number]       Subjective: Pt denies abdominal pain, but then gets SOB when moves in the bed because he states his belly feels bloated.  He is completely unaware that he has had diarrhea in his bed.  He is unaware that he has a heat pack on his shoulder or why.  He thinks he drank some liquids last night, but he isn't totally sure.  Objective: Vital signs in last 24 hours: Temp:  [98.1 F (36.7 C)-99.7 F (37.6 C)] 98.1 F (36.7 C) (11/23 0300) Pulse Rate:  [78-96] 89 (11/23 0430) Resp:  [15-31] 15 (11/23 0430) BP: (123-151)/(76-87) 134/76 (11/23 0400) SpO2:  [95 %-99 %] 95 % (11/23 0430)    Intake/Output from previous day: 11/22 0701 - 11/23 0700 In: 240 [P.O.:240] Out: 200 [Urine:200] Intake/Output this shift: No intake/output data recorded.  PE: Heart: irregular Lungs: CTAB Abd: tender greatest on right side of abdomen with some voluntary guarding.  Seems bloated. +BS Psych: A&O x 3, but doesn't remember the heat pack on his shoulder or why its there and who put it there.  Doesn't know he is stooling on himself.  Doesn't remember if he ate last night or not  Lab Results:  Recent Labs    06/04/18 0412 06/05/18 0527  WBC 9.1 8.8  HGB 11.0* 10.6*  HCT 35.1* 34.8*  PLT 100* 93*   BMET Recent Labs    06/04/18 0412 06/05/18 0527  NA 137 135  K 4.1 3.6  CL 107 110  CO2 24 20*  GLUCOSE 204* 194*  BUN 23 16  CREATININE 1.84* 1.59*  CALCIUM 8.0* 7.6*   PT/INR No results for input(s): LABPROT, INR in the last 72 hours. CMP     Component Value Date/Time   NA 135 06/05/2018 0527   K 3.6 06/05/2018 0527   CL 110 06/05/2018 0527   CO2 20 (L) 06/05/2018 0527   GLUCOSE 194 (H) 06/05/2018 0527   BUN 16 06/05/2018 0527   CREATININE 1.59 (H) 06/05/2018 0527   CALCIUM 7.6 (L) 06/05/2018 0527   PROT 6.1 (L) 06/04/2018 0412   ALBUMIN 3.0 (L) 06/04/2018 0412   AST 28 06/04/2018 0412   ALT 27  06/04/2018 0412   ALKPHOS 43 06/04/2018 0412   BILITOT 1.4 (H) 06/04/2018 0412   GFRNONAA 39 (L) 06/05/2018 0527   GFRAA 45 (L) 06/05/2018 0527   Lipase     Component Value Date/Time   LIPASE 26 06/03/2018 1026       Studies/Results: No results found.  Anti-infectives: Anti-infectives (From admission, onward)   Start     Dose/Rate Route Frequency Ordered Stop   06/03/18 2000  piperacillin-tazobactam (ZOSYN) IVPB 3.375 g     3.375 g 12.5 mL/hr over 240 Minutes Intravenous Every 8 hours 06/03/18 1542     06/03/18 1300  piperacillin-tazobactam (ZOSYN) IVPB 3.375 g  Status:  Discontinued     3.375 g 12.5 mL/hr over 240 Minutes Intravenous  Once 06/03/18 1247 06/03/18 1731       Assessment/Plan A.fib - hold Eliquis, last dose 11/19 HTN GERD T2DM PMH prostate CA - 2014 HxT1N0colon cancer of hepatic flexure, sigmoid stricture S/P robotic LOA, right colectomy, sigmoidectomy 11/26/17 Dr. Michael Boston   Pneumoperitoneum, unknown etiology - got Kcentra, but had allergic reaction to this -concerned about patient as he is still tender on the right side with some guarding.  He feels bloated.  Repeat plain abdominal film to evaluate for further free air. -having multiple liquid stools for which he is incontinent and isn't aware.  Will check a c diff, although it has no smell, just to rule out possible source.  Apparently he was doing this some yesterday as well. -WBC not checked today.  Labs pending.  Vitals are normal. -will not advance diet at this time.  Still has abdominal pain. Acute thrombocytopenia -plts were normal on admission around 156K.  Decreased to 93K yesterday.  Will recheck today and follow.  This is a bit concerning ?Alzheimer's per H&P -unclear about his mental status.  He knows person, place, and time, but unable to answer basic questions about his current situation.  FEN - IVFs/Clear liquids VTE - SCDs/hold chemical prophylaxis due to thrombocytopenia ID  - Zosyn 11/20 -->   LOS: 3 days    [name] , [facility] Surgery 06/06/2018, 9:08 AM Pager: [phone]

## 2018-06-07 LAB — CBC
HCT: 35.4 % — ABNORMAL LOW (ref 39.0–52.0)
HEMOGLOBIN: 11.5 g/dL — AB (ref 13.0–17.0)
MCH: 25.4 pg — ABNORMAL LOW (ref 26.0–34.0)
MCHC: 32.5 g/dL (ref 30.0–36.0)
MCV: 78.1 fL — ABNORMAL LOW (ref 80.0–100.0)
PLATELETS: 131 10*3/uL — AB (ref 150–400)
RBC: 4.53 MIL/uL (ref 4.22–5.81)
RDW: 22.3 % — AB (ref 11.5–15.5)
WBC: 8.3 10*3/uL (ref 4.0–10.5)
nRBC: 0 % (ref 0.0–0.2)

## 2018-06-07 NOTE — Progress Notes (Signed)
Patient ID: Calvin Burnett, male   DOB: [birth year], 84 y.o.   MRN: [id number]       Subjective: Patient up in chair today. Tolerating clear liquids with no issues.  Denies any major abdominal pain.  Still with watery BMs.  Objective: Vital signs in last 24 hours: Temp:  [98 F (36.7 C)-98.9 F (37.2 C)] 98.1 F (36.7 C) (11/24 0742) Pulse Rate:  [34-97] 34 (11/24 0800) Resp:  [21-31] 25 (11/24 0800) BP: (91-144)/(68-94) 102/83 (11/24 0800) SpO2:  [94 %-98 %] 96 % (11/24 0800) Last BM Date: 06/06/18  Intake/Output from previous day: No intake/output data recorded. Intake/Output this shift: No intake/output data recorded.  PE: Heart: regular Lungs: CTAB Abd: soft, obese, some distention, +BS, tender only on the right today, still with some voluntary guarding, but not quite as much.  Lab Results:  Recent Labs    06/06/18 0946 06/07/18 0629  WBC 10.0 8.3  HGB 12.5* 11.5*  HCT 39.2 35.4*  PLT 137* 131*   BMET Recent Labs    06/05/18 0527  NA 135  K 3.6  CL 110  CO2 20*  GLUCOSE 194*  BUN 16  CREATININE 1.59*  CALCIUM 7.6*   PT/INR No results for input(s): LABPROT, INR in the last 72 hours. CMP     Component Value Date/Time   NA 135 06/05/2018 0527   K 3.6 06/05/2018 0527   CL 110 06/05/2018 0527   CO2 20 (L) 06/05/2018 0527   GLUCOSE 194 (H) 06/05/2018 0527   BUN 16 06/05/2018 0527   CREATININE 1.59 (H) 06/05/2018 0527   CALCIUM 7.6 (L) 06/05/2018 0527   PROT 6.1 (L) 06/04/2018 0412   ALBUMIN 3.0 (L) 06/04/2018 0412   AST 28 06/04/2018 0412   ALT 27 06/04/2018 0412   ALKPHOS 43 06/04/2018 0412   BILITOT 1.4 (H) 06/04/2018 0412   GFRNONAA 39 (L) 06/05/2018 0527   GFRAA 45 (L) 06/05/2018 0527   Lipase     Component Value Date/Time   LIPASE 26 06/03/2018 1026       Studies/Results: Dg Abd Portable 1v  Result Date: 06/06/2018 CLINICAL DATA:  Bowel distension, pneumoperitoneum EXAM: PORTABLE ABDOMEN - 1 VIEW COMPARISON:  Portable exam 1135  hours compared to CT abdomen and pelvis 06/03/2018 FINDINGS: Lung bases clear. Gaseous distention of transverse colon. Gas throughout additional nondistended large and small bowel loops to rectum. No bowel wall thickening. Small focus of lucency at the superior aspect of liver could represent free air seen on prior study. Bones diffusely demineralized. Brachytherapy seed implants at prostate gland. IMPRESSION: Probable focus of free air again identified at the liver. Mild gaseous distention of the transverse colon, may be related to the inflammatory process identified at a small bowel loop posterior transverse colon on the prior CT exam question focal ileus. Remainder of exam unremarkable. Electronically Signed   By: Lavonia Dana M.D.   On: 06/06/2018 11:51    Anti-infectives: Anti-infectives (From admission, onward)   Start     Dose/Rate Route Frequency Ordered Stop   06/03/18 2000  piperacillin-tazobactam (ZOSYN) IVPB 3.375 g     3.375 g 12.5 mL/hr over 240 Minutes Intravenous Every 8 hours 06/03/18 1542     06/03/18 1300  piperacillin-tazobactam (ZOSYN) IVPB 3.375 g  Status:  Discontinued     3.375 g 12.5 mL/hr over 240 Minutes Intravenous  Once 06/03/18 1247 06/03/18 1731       Assessment/Plan A.fib - hold Eliquis, last dose 11/19 HTN GERD T2DM PMH  prostate CA - 2014 HxT1N0colon cancer of hepatic flexure, sigmoid stricture S/P robotic LOA, right colectomy, sigmoidectomy 11/26/17 Dr. Michael Boston   Pneumoperitoneum, unknown etiology - got Kcentra, but had allergic reaction to this -patient seems improved today.  Still bloated and with some pain, but eating more liquids and pain somewhat better. -try full liquids -WBC normal, vitals still stable. Acute thrombocytopenia -resolved ?Alzheimer's per H&P -seems improved today.  FEN - IVFs/FLD VTE - SCDs/chemical prophylaxis says contraindicated in setting of kcentra reaction, may need to discuss with pharmacy ID - Zosyn 11/20  -->   LOS: 4 days    [name] , [facility] Surgery 06/07/2018, 9:52 AM Pager: [phone]

## 2018-06-08 MED ORDER — PSYLLIUM 95 % PO PACK
1.0000 | PACK | Freq: Every day | ORAL | Status: DC
  Administered 2018-06-08 – 2018-06-09 (×2): 1 via ORAL
  Filled 2018-06-08 (×2): qty 1

## 2018-06-08 MED ORDER — FENTANYL CITRATE (PF) 100 MCG/2ML IJ SOLN: 25.0000 ug | INTRAMUSCULAR | Status: DC | PRN

## 2018-06-08 MED ORDER — ALUM & MAG HYDROXIDE-SIMETH 200-200-20 MG/5ML PO SUSP: 30.0000 mL | Freq: Four times a day (QID) | ORAL | Status: DC | PRN

## 2018-06-08 MED ORDER — MEMANTINE HCL ER 28 MG PO CP24
28.0000 mg | ORAL_CAPSULE | Freq: Every day | ORAL | Status: DC
  Administered 2018-06-08 – 2018-06-09 (×2): 28 mg via ORAL
  Filled 2018-06-08 (×2): qty 1

## 2018-06-08 MED ORDER — PHENOL 1.4 % MT LIQD: 1.0000 | OROMUCOSAL | Status: DC | PRN

## 2018-06-08 MED ORDER — SODIUM CHLORIDE 0.9% FLUSH: 3.0000 mL | INTRAVENOUS | Status: DC | PRN

## 2018-06-08 MED ORDER — HYDROCORTISONE 1 % EX CREA
1.0000 "application " | TOPICAL_CREAM | Freq: Three times a day (TID) | CUTANEOUS | Status: DC | PRN
  Filled 2018-06-08: qty 28

## 2018-06-08 MED ORDER — ENSURE SURGERY PO LIQD
237.0000 mL | Freq: Two times a day (BID) | ORAL | Status: DC
  Administered 2018-06-08: 237 mL via ORAL
  Filled 2018-06-08 (×4): qty 237

## 2018-06-08 MED ORDER — SODIUM CHLORIDE 0.9% FLUSH
3.0000 mL | Freq: Two times a day (BID) | INTRAVENOUS | Status: DC
  Administered 2018-06-08 – 2018-06-09 (×3): 3 mL via INTRAVENOUS

## 2018-06-08 MED ORDER — GUAIFENESIN-DM 100-10 MG/5ML PO SYRP
10.0000 mL | ORAL_SOLUTION | ORAL | Status: DC | PRN
  Administered 2018-06-08: 10 mL via ORAL
  Filled 2018-06-08: qty 10

## 2018-06-08 MED ORDER — HYDROCORTISONE 2.5 % RE CREA
1.0000 "application " | TOPICAL_CREAM | Freq: Four times a day (QID) | RECTAL | Status: DC | PRN
  Filled 2018-06-08: qty 28.35

## 2018-06-08 MED ORDER — LATANOPROST 0.005 % OP SOLN
1.0000 [drp] | Freq: Every day | OPHTHALMIC | Status: DC
  Administered 2018-06-08: 1 [drp] via OPHTHALMIC
  Filled 2018-06-08: qty 2.5

## 2018-06-08 MED ORDER — SODIUM CHLORIDE 0.9 % IV SOLN: 250.0000 mL | INTRAVENOUS | Status: DC | PRN

## 2018-06-08 MED ORDER — ACETAMINOPHEN 500 MG PO TABS: 500.0000 mg | ORAL_TABLET | Freq: Every day | ORAL | Status: DC | PRN

## 2018-06-08 MED ORDER — METHOCARBAMOL 1000 MG/10ML IJ SOLN
1000.0000 mg | Freq: Four times a day (QID) | INTRAVENOUS | Status: DC | PRN
  Filled 2018-06-08: qty 10

## 2018-06-08 MED ORDER — MENTHOL 3 MG MT LOZG: 1.0000 | LOZENGE | OROMUCOSAL | Status: DC | PRN

## 2018-06-08 MED ORDER — SACCHAROMYCES BOULARDII 250 MG PO CAPS
250.0000 mg | ORAL_CAPSULE | Freq: Two times a day (BID) | ORAL | Status: DC
  Administered 2018-06-08 – 2018-06-09 (×3): 250 mg via ORAL
  Filled 2018-06-08 (×3): qty 1

## 2018-06-08 NOTE — Progress Notes (Addendum)
Calvin Burnett [id number] 02-02-1934  CARE TEAM:  PCP: Marin Olp, MD  Outpatient Care Team: Patient Care Team: Marin Olp, MD as PCP - General (Family Medicine) Michael Boston, MD as Consulting Physician (General Surgery) Ladene Artist, MD as Consulting Physician (Gastroenterology) Stanford Breed Denice Bors, MD as Consulting Physician (Cardiology)  Inpatient Treatment Team: Treatment Team: Attending Provider: Nolon Nations, MD; Consulting Physician: Michael Boston, MD; Rounding Team: Nolon Nations, MD; Student Nurse: Denver Faster; Technician: Earley Abide, NT; Registered Nurse: Cherylynn Ridges, RN   Problem List:   Principal Problem:   Pneumoperitoneum of unknown etiology Active Problems:   Vascular dementia ([place])   pT1pN0 colon cancer s/p robotic right colectomy 11/26/2017   Stricture of sigmoid s/p robotic sigmoidectomy 11/26/2017   Essential hypertension   GERD   Diabetes mellitus type II, controlled ([place])   CKD (chronic kidney disease), stage III ([place])        Assessment  Improving  History of abdominal pain and nausea with  Ascending colon cancer status post robotic right colectomy 5/15. tiny pockets of pneumoperitoneum of uncertain etiology.  Favor formation at colon and of ileocolonic anastomosis delayed.  Sigmoid stricture status post robotic sigmoidectomy 5/15.  No evidence of leak or abscess.  [facility] Stay = 5 days)  Plan:  Advance to solid diet.  Stop IV fluids.  Stop telemetry.  Transfer to floor.  If has worsening symptoms, CT scan.  Seems unlikely at this point.  VTE prophylaxis- SCDs, etc  Mobilize as tolerated to help recovery  Resume Namenda for vascular dementia.  Resume Eliquis discharge, most likely tomorrow if improved  D/C patient from hospital when patient meets criteria (anticipate in 1-2 day(s)):  Tolerating oral intake well Ambulating well Adequate pain control without IV medications Urinating    Having flatus Disposition planning in place   25 minutes spent in review, evaluation, examination, counseling, and coordination of care.  More than 50% of that time was spent in counseling.  06/08/2018    Subjective: (Chief complaint)  Sitting up in bed trying full liquids.  Pain very minimal nearly gone.  Appetite good.  No nausea or vomiting.  Feels much better overall.  No telemetry events.  Objective:  Vital signs:  Vitals:   06/07/18 1615 06/07/18 1900 06/07/18 2300 06/08/18 0300  BP: (!) 148/78 (!) 135/96 (!) 142/90 132/89  Pulse: 97 86 89 85  Resp: (!) 24 (!) 21 (!) 26 (!) 22  Temp: 97.8 F (36.6 C) 98.3 F (36.8 C) 98.7 F (37.1 C) 98.5 F (36.9 C)  TempSrc: Oral Oral Oral Oral  SpO2: 98% 96% 98% 94%  Weight:      Height:        Last BM Date: 06/07/18  Intake/Output   Yesterday:  11/24 0701 - 11/25 0700 In: 600 [I.V.:600] Out: 450 [Urine:450] This shift:  No intake/output data recorded.  Bowel function:  Flatus: YES  BM:  YES  Drain: (No drain)   Physical Exam:  General: Pt awake/alert/oriented x4 in no acute distress.  Sitting up.  Smiling.  Relaxed. Eyes: PERRL, normal EOM.  Sclera clear.  No icterus Neuro: CN II-XII intact w/o focal sensory/motor deficits. Lymph: No head/neck/groin lymphadenopathy Psych:  No delerium/psychosis/paranoia HENT: Normocephalic, Mucus membranes moist.  No thrush Neck: Supple, No tracheal deviation Chest: No chest wall pain w good excursion CV:  Pulses intact.  Regular rhythm MS: Normal AROM mjr joints.  No obvious deformity  Abdomen: Soft.  Nondistended.  Nontender.  No evidence of peritonitis.  No incarcerated hernias.  Ext:  No deformity.  No mjr edema.  No cyanosis Skin: No petechiae / purpura  Results:   Labs: Results for orders placed or performed during the hospital encounter of 06/03/18 (from the past 48 hour(s))  C difficile quick scan w PCR reflex     Status: None   Collection Time:  06/06/18  9:21 AM  Result Value Ref Range   C Diff antigen NEGATIVE NEGATIVE   C Diff toxin NEGATIVE NEGATIVE   C Diff interpretation No C. difficile detected.     Comment: Performed at [facility], [place] [address] [place], [place] [address]  CBC     Status: Abnormal   Collection Time: 06/06/18  9:46 AM  Result Value Ref Range   WBC 10.0 4.0 - 10.5 K/uL   RBC 5.01 4.22 - 5.81 MIL/uL   Hemoglobin 12.5 (L) 13.0 - 17.0 g/dL   HCT 39.2 39.0 - 52.0 %   MCV 78.2 (L) 80.0 - 100.0 fL   MCH 25.0 (L) 26.0 - 34.0 pg   MCHC 31.9 30.0 - 36.0 g/dL   RDW 22.5 (H) 11.5 - 15.5 %   Platelets 137 (L) 150 - 400 K/uL   nRBC 0.0 0.0 - 0.2 %    Comment: Performed at [facility], [place] [address], [place], [place] [address]  CBC     Status: Abnormal   Collection Time: 06/07/18  6:29 AM  Result Value Ref Range   WBC 8.3 4.0 - 10.5 K/uL   RBC 4.53 4.22 - 5.81 MIL/uL   Hemoglobin 11.5 (L) 13.0 - 17.0 g/dL   HCT 35.4 (L) 39.0 - 52.0 %   MCV 78.1 (L) 80.0 - 100.0 fL   MCH 25.4 (L) 26.0 - 34.0 pg   MCHC 32.5 30.0 - 36.0 g/dL   RDW 22.3 (H) 11.5 - 15.5 %   Platelets 131 (L) 150 - 400 K/uL   nRBC 0.0 0.0 - 0.2 %    Comment: Performed at [place] [address] [place], [place] [address]    Imaging / Studies: Dg Abd Portable 1v  Result Date: 06/06/2018 CLINICAL DATA:  Bowel distension, pneumoperitoneum EXAM: PORTABLE ABDOMEN - 1 VIEW COMPARISON:  Portable exam 1135 hours compared to CT abdomen and pelvis 06/03/2018 FINDINGS: Lung bases clear. Gaseous distention of transverse colon. Gas throughout additional nondistended large and small bowel loops to rectum. No bowel wall thickening. Small focus of lucency at the superior aspect of liver could represent free air seen on prior study. Bones diffusely demineralized. Brachytherapy seed implants at prostate gland. IMPRESSION: Probable focus of free air again identified at the liver. Mild gaseous distention of the transverse colon, may be  related to the inflammatory process identified at a small bowel loop posterior transverse colon on the prior CT exam question focal ileus. Remainder of exam unremarkable. Electronically Signed   By: Lavonia Dana M.D.   On: 06/06/2018 11:51    Medications / Allergies: per chart  Antibiotics: Anti-infectives (From admission, onward)   Start     Dose/Rate Route Frequency Ordered Stop   06/03/18 2000  piperacillin-tazobactam (ZOSYN) IVPB 3.375 g     3.375 g 12.5 mL/hr over 240 Minutes Intravenous Every 8 hours 06/03/18 1542     06/03/18 1300  piperacillin-tazobactam (ZOSYN) IVPB 3.375 g  Status:  Discontinued     3.375 g 12.5 mL/hr over 240 Minutes Intravenous  Once 06/03/18  1247 06/03/18 1731        Note: Portions of this report may have been transcribed using voice recognition software. Every effort was made to ensure accuracy; however, inadvertent computerized transcription errors may be present.   Any transcriptional errors that result from this process are unintentional.     [name], MD, FACS, MASCRS Gastrointestinal and Minimally Invasive Surgery    [address] [place], [place] [address] [phone] Main / Paging [phone] Fax

## 2018-06-08 NOTE — Progress Notes (Signed)
Pt refusing ensure states "it has too much sugar and I am a diabetic".   Pt also does not feel lik ehe needs the IVPB scheduled robaxin but may change his mind later.   Patient up comfortably in the chair entertaining family.

## 2018-06-09 LAB — CREATININE, SERUM
Creatinine, Ser: 1.59 mg/dL — ABNORMAL HIGH (ref 0.61–1.24)
GFR calc non Af Amer: 40 mL/min — ABNORMAL LOW (ref >60)
GFR, EST AFRICAN AMERICAN: 46 mL/min — AB (ref >60)

## 2018-06-09 LAB — CBC
HEMATOCRIT: 31.4 % — AB (ref 39.0–52.0)
Hemoglobin: 10.1 g/dL — ABNORMAL LOW (ref 13.0–17.0)
MCH: 25.1 pg — ABNORMAL LOW (ref 26.0–34.0)
MCHC: 32.2 g/dL (ref 30.0–36.0)
MCV: 78.1 fL — AB (ref 80.0–100.0)
NRBC: 0 % (ref 0.0–0.2)
Platelets: 194 10*3/uL (ref 150–400)
RBC: 4.02 MIL/uL — AB (ref 4.22–5.81)
RDW: 21.8 % — ABNORMAL HIGH (ref 11.5–15.5)
WBC: 7.8 10*3/uL (ref 4.0–10.5)

## 2018-06-09 LAB — POTASSIUM: POTASSIUM: 3.1 mmol/L — AB (ref 3.5–5.1)

## 2018-06-09 MED ORDER — ACETAMINOPHEN 500 MG PO TABS: 500.0000 mg | ORAL_TABLET | Freq: Every day | ORAL | Status: DC | PRN

## 2018-06-09 MED ORDER — METHOCARBAMOL 1000 MG/10ML IJ SOLN
500.0000 mg | Freq: Four times a day (QID) | INTRAVENOUS | Status: DC | PRN
  Administered 2018-06-09: 500 mg via INTRAVENOUS
  Filled 2018-06-09: qty 5

## 2018-06-09 NOTE — Discharge Instructions (Signed)
RECOVERING AFTER AN ADMISSION FOR ABDOMINAL ISSUES   EAT Gradually transition to a high fiber diet with a fiber supplement over the next few days after discharge  WALK Walk an hour a day.  Control your pain to do that.    CONTROL PAIN Control pain so that you can walk, sleep, tolerate sneezing/coughing, go up/down stairs.  HAVE A BOWEL MOVEMENT DAILY Keep your bowels regular to avoid problems.  OK to try a laxative to override constipation.  OK to use an antidairrheal to slow down diarrhea.  Call if not better after 2 tries  CALL IF YOU HAVE PROBLEMS/CONCERNS Call if you are still struggling despite following these instructions. Call if you have concerns not answered by these instructions     After your BOWEL OBSTRUCTION, expect some issues over the next few weeks.    To help you through this temporary phase, we start you out on a pureed (blenderized) diet.  Your first meal in the hospital was thin liquids.  You should have been given a pureed diet by the time you left the hospital.  We ask patients to stay on a pureed diet for the first few days to avoid anything getting "stuck."  Don't be alarmed if your ability to swallow doesn't progress according to this plan.  Everyone is different and some diets can advance more or less quickly.     Some BASIC RULES to follow are:  Maintain an upright position whenever eating or drinking.  Take small bites - just a teaspoon size bite at a time.  Eat slowly.  It may also help to eat only one food at a time.  Consider nibbling through smaller, more frequent meals & avoid the urge to eat BIG meals  Do not push through feelings of fullness, nausea, or bloatedness  Do not mix solid foods and liquids in the same mouthful  Try not to "wash foods down" with large gulps of liquids.  Avoid carbonated (bubbly/fizzy) drinks.    Avoid foods that make you feel gassy or bloated.  Start with bland foods first.  Wait on trying greasy, fried, or  spicy meals until you are tolerating more bland solids well.  Expect to be more gassy/flatulent/bloated initially.  Walking will help your body manage it better.  Consider using medications for bloating that contain simethicone such as  Maalox or Gas-X   Eat in a relaxed atmosphere & minimize distractions.  Avoid talking while eating.    Do not use straws.  Following each meal, sit in an upright position (90 degree angle) for 60 to 90 minutes.  Going for a short walk can help as well  If food does stick, don't panic.  Try to relax and let the food pass on its own.  Sipping WARM LIQUID such as strong hot black tea can also help slide it down.   Be gradual in changes & use common sense:  -If you easily tolerating a certain "level" of foods, advance to the next level gradually -If you are having trouble swallowing a particular food, then avoid it.   -If food is sticking when you advance your diet, go back to thinner previous diet (the lower LEVEL) for 1-2 days.  LEVEL 1 = PUREED DIET  Do for the first [place] in this group are pureed or blenderized to a smooth, mashed potato-like consistency.  -If necessary, the pureed foods can keep their shape with the addition of a thickening agent.   -  Meat should be pureed to a smooth, pasty consistency.  Hot broth or gravy may be added to the pureed meat, approximately 1 oz. of liquid per 3 oz. serving of meat. -CAUTION:  If any foods do not puree into a smooth consistency, swallowing will be more difficult.  (For example, nuts or seeds sometimes do not blend well.)  Hot Foods Cold Foods  Pureed scrambled eggs and cheese Pureed cottage cheese  Baby cereals Thickened juices and nectars  Thinned cooked cereals (no lumps) Thickened milk or eggnog  Pureed [place] toast or pancakes Ensure  Mashed potatoes Ice cream  Pureed parsley, au gratin, scalloped potatoes, candied sweet potatoes Fruit or [place] ice, sherbet   Pureed buttered or alfredo noodles Plain yogurt  Pureed vegetables (no corn or peas) Instant breakfast  Pureed soups and creamed soups Smooth pudding, mousse, custard  Pureed scalloped apples Whipped gelatin  Gravies Sugar, syrup, honey, jelly  Sauces, cheese, tomato, barbecue, white, creamed Cream  Any baby food Creamer  Alcohol in moderation (not beer or champagne) Margarine  Coffee or tea Mayonnaise   Ketchup, mustard   Apple sauce   SAMPLE MENU:  PUREED DIET Breakfast Lunch Dinner   Orange juice, 1/2 cup  Cream of wheat, 1/2 cup  Pineapple juice, 1/2 cup  Pureed [place], barley soup, 3/4 cup  Pureed Hawaiian chicken, 3 oz   Scrambled eggs, mashed or blended with cheese, 1/2 cup  Tea or coffee, 1 cup   Whole milk, 1 cup   Non-dairy creamer, 2 Tbsp.  Mashed potatoes, 1/2 cup  Pureed cooled broccoli, 1/2 cup  Apple sauce, 1/2 cup  Coffee or tea  Mashed potatoes, 1/2 cup  Pureed spinach, 1/2 cup  Frozen yogurt, 1/2 cup  Tea or coffee      LEVEL 2 = SOFT DIET  After your first few days, you can advance to a soft diet.   Keep on this diet until everything goes down easily.  Hot Foods Cold Foods  White fish Cottage cheese  Stuffed fish Junior baby fruit  Baby food meals Semi thickened juices  Minced soft cooked, scrambled, poached eggs nectars  Souffle & omelets Ripe mashed bananas  Cooked cereals Canned fruit, pineapple sauce, milk  potatoes Milkshake  Buttered or Alfredo noodles Custard  Cooked cooled vegetable Puddings, including tapioca  Sherbet Yogurt  Vegetable soup or alphabet soup Fruit ice, [place] ice  Gravies Whipped gelatin  Sugar, syrup, honey, jelly Junior baby desserts  Sauces:  Cheese, creamed, barbecue, tomato, white Cream  Coffee or tea Margarine   SAMPLE MENU:  LEVEL 2 Breakfast Lunch Dinner   Orange juice, 1/2 cup  Oatmeal, 1/2 cup  Scrambled eggs with cheese, 1/2 cup  Decaffeinated tea, 1 cup  Whole milk, 1  cup  Non-dairy creamer, 2 Tbsp  Pineapple juice, 1/2 cup  Minced beef, 3 oz  Gravy, 2 Tbsp  Mashed potatoes, 1/2 cup  Minced fresh broccoli, 1/2 cup  Applesauce, 1/2 cup  Coffee, 1 cup  [place], barley soup, 3/4 cup  Minced Hawaiian chicken, 3 oz  Mashed potatoes, 1/2 cup  Cooked spinach, 1/2 cup  Frozen yogurt, 1/2 cup  Non-dairy creamer, 2 Tbsp      LEVEL 3 = CHOPPED DIET  -After all the foods in level 2 (soft diet) are passing through well you should advance up to more chopped foods.  -It is still important to cut these foods into small pieces and eat slowly.  [place] cheese  Chopped Swedish meatballs Yogurt  Meat salads (ground or flaked meat) Milk  Flaked fish (tuna) Milkshakes  Poached or scrambled eggs Soft, cold, dry cereal  Souffles and omelets Fruit juices or nectars  Cooked cereals Chopped canned fruit  Chopped [place] toast or pancakes Canned fruit cocktail  Noodles or pasta (no rice) Pudding, mousse, custard  Cooked vegetables (no frozen peas, corn, or mixed vegetables) Green salad  Canned small sweet peas Ice cream  Creamed soup or vegetable soup Fruit ice, [place] ice  Pureed vegetable soup or alphabet soup Non-dairy creamer  Ground scalloped apples Margarine  Gravies Mayonnaise  Sauces:  Cheese, creamed, barbecue, tomato, white Ketchup  Coffee or tea Mustard   SAMPLE MENU:  LEVEL 3 Breakfast Lunch Dinner   Orange juice, 1/2 cup  Oatmeal, 1/2 cup  Scrambled eggs with cheese, 1/2 cup  Decaffeinated tea, 1 cup  Whole milk, 1 cup  Non-dairy creamer, 2 Tbsp  Ketchup, 1 Tbsp  Margarine, 1 tsp  Salt, 1/4 tsp  Sugar, 2 tsp  Pineapple juice, 1/2 cup  Ground beef, 3 oz  Gravy, 2 Tbsp  Mashed potatoes, 1/2 cup  Cooked spinach, 1/2 cup  Applesauce, 1/2 cup  Decaffeinated coffee  Whole milk  Non-dairy creamer, 2 Tbsp  Margarine, 1 tsp  Salt, 1/4 tsp  Pureed [place], barley soup, 3/4  cup  Barbecue chicken, 3 oz  Mashed potatoes, 1/2 cup  Ground fresh broccoli, 1/2 cup  Frozen yogurt, 1/2 cup  Decaffeinated tea, 1 cup  Non-dairy creamer, 2 Tbsp  Margarine, 1 tsp  Salt, 1/4 tsp  Sugar, 1 tsp    LEVEL 4:  REGULAR FOODS  -Foods in this group are soft, moist, regularly textured foods.   -This level includes meat and breads, which tend to be the hardest things to swallow.   -Eat very slowly, chew well and continue to avoid carbonated drinks. -most people are at this level in 2-4 weeks  Hot Foods Cold Foods  Baked fish or skinned Soft cheeses - cottage cheese  Souffles and omelets Cream cheese  Eggs Yogurt  Stuffed shells Milk  Spaghetti with meat sauce Milkshakes  Cooked cereal Cold dry cereals (no nuts, dried fruit, coconut)  [place] toast or pancakes Crackers  Buttered toast Fruit juices or nectars  Noodles or pasta (no rice) Canned fruit  Potatoes (all types) Ripe bananas  Soft, cooked vegetables (no corn, lima, or baked beans) Peeled, ripe, fresh fruit  Creamed soups or vegetable soup Cakes (no nuts, dried fruit, coconut)  Canned chicken noodle soup Plain doughnuts  Gravies Ice cream  Bacon dressing Pudding, mousse, custard  Sauces:  Cheese, creamed, barbecue, tomato, white Fruit ice, [place] ice, sherbet  Decaffeinated tea or coffee Whipped gelatin  Pork chops Regular gelatin   Canned fruited gelatin molds   Sugar, syrup, honey, jam, jelly   Cream   Non-dairy   Margarine   Oil   Mayonnaise   Ketchup   Mustard   TROUBLESHOOTING IRREGULAR BOWELS  1) Avoid extremes of bowel movements (no bad constipation/diarrhea)  2) Miralax 17gm mixed in 8oz. water or juice-daily. May use BID as needed.  3) Gas-x,Phazyme, etc. as needed for gas & bloating.  4) Soft,bland diet. No spicy,greasy,fried foods.  5) Prilosec over-the-counter as needed  6) May hold gluten/wheat products from diet to see if symptoms improve.  7) May try probiotics (Align,  Activa, etc) to help calm the bowels down  7) If symptoms become worse call back immediately.    If  you have any questions please call our office at [place]: [phone].   Small Bowel Obstruction A small bowel obstruction is a blockage in the small bowel. The small bowel, which is also called the small intestine, is a long, slender tube that connects the stomach to the colon. When a person eats and drinks, food and fluids go from the stomach to the small bowel. This is where most of the nutrients in the food and fluids are absorbed. A small bowel obstruction will prevent food and fluids from passing through the small bowel as they normally do during digestion. The small bowel can become partially or completely blocked. This can cause symptoms such as abdominal pain, vomiting, and bloating. If this condition is not treated, it can be dangerous because the small bowel could rupture. CAUSES Common causes of this condition include: 2. Scar tissue from previous surgery or radiation treatment. 3. Recent surgery. This may cause the movements of the bowel to slow down and cause food to block the intestine. 4. Hernias. 5. Inflammatory bowel disease (colitis). 6. Twisting of the bowel (volvulus). 7. Tumors. 8. A foreign body. 9. Slipping of a part of the bowel into another part (intussusception). SYMPTOMS Symptoms of this condition include: 2. Abdominal pain. This may be dull cramps or sharp pain. It may occur in one area, or it may be present in the entire abdomen. Pain can range from mild to severe, depending on the degree of obstruction. 3. Nausea and vomiting. Vomit may be greenish or a yellow bile color. 4. Abdominal bloating. 5. Constipation. 6. Lack of passing gas. 7. Frequent belching. 8. Diarrhea. This may occur if the obstruction is partial and runny stool is able to leak around the obstruction. DIAGNOSIS This condition may be diagnosed based on a physical exam,  medical history, and X-rays of the abdomen. You may also have other tests, such as a CT scan of the abdomen and pelvis. TREATMENT Treatment for this condition depends on the cause and severity of the problem. Treatment options may include: 2. Bed rest along with fluids and pain medicines that are given through an IV tube inserted into one of your veins. Sometimes, this is all that is needed for the obstruction to improve. 3. Following a simple diet. In some cases, a clear liquid diet may be required for several days. This allows the bowel to rest. 4. Placement of a small tube (nasogastric tube) into the stomach. When the bowel is blocked, it usually swells up like a balloon that is filled with air and fluids. The air and fluids may be removed by suction through the nasogastric tube. This can help with pain, discomfort, and nausea. It can also help the obstruction to clear up faster. 5. Surgery. This may be required if other treatments do not work. Bowel obstruction from a hernia may require early surgery and can be an emergency procedure. Surgery may also be required for scar tissue that causes frequent or severe obstructions. HOME CARE INSTRUCTIONS  Get plenty of rest.  Follow instructions from your health care provider about eating restrictions. You may need to avoid solid foods and consume only clear liquids until your condition improves.  Take over-the-counter and prescription medicines only as told by your health care provider.  Keep all follow-up visits as told by your health care provider. This is important. SEEK MEDICAL CARE IF: 2. You have a fever. 3. You have chills. SEEK IMMEDIATE MEDICAL CARE IF: 2. You have increased pain or cramping.  3. You vomit blood. 4. You have uncontrolled vomiting or nausea. 5. You cannot drink fluids because of vomiting or pain. 6. You develop confusion. 7. You begin feeling very dry or thirsty (dehydrated). 8. You have severe bloating. 9. You feel  extremely weak or you faint.   This information is not intended to replace advice given to you by your health care provider. Make sure you discuss any questions you have with your health care provider.    Managing Pain  ######################################################################   CONTROL PAIN Control pain so that you can walk, sleep, tolerate sneezing/coughing, go up/down stairs.  (Good pain control is not pain free only when lying still, unable to move)  WALK Walk an hour a day.  Control your pain to do that.   HAVE A BOWEL MOVEMENT DAILY Keep your bowels regular to avoid problems.  OK to try a laxative to override constipation.  OK to use an antidairrheal to slow down diarrhea.  Call if not better after 2 tries  CALL IF YOU HAVE PROBLEMS/CONCERNS Call if you are still struggling despite following these instructions. Call if you have concerns not answered by these instructions  ######################################################################   Pain after surgery or related to activity is often due to strain/injury to muscle, tendon, nerves and/or incisions.  This pain is usually short-term and will improve in a few months.   Many people find it helpful to do the following things TOGETHER to help speed the process of healing and to get back to regular activity more quickly:  1. Avoid heavy physical activity at first a. No lifting greater than 20 pounds at first, then increase to lifting as tolerated over the next few weeks b. Do not push through the pain.  Listen to your body and avoid positions and maneuvers than reproduce the pain.  Wait a few days before trying something more intense c. Walking is okay as tolerated, but go slowly and stop when getting sore.  If you can walk 30 minutes without stopping or pain, you can try more intense activity (running, jogging, aerobics, cycling, swimming, treadmill, sex, sports, weightlifting, etc ) d. Remember: If it hurts  to do it, then dont do it!  2. Take Acetaminophen Anti-inflammatory medication i. Acetaminophen 500mg  tabs (Tylenol) 1-2 pills with every meal and just before bedtime (avoid if you have liver problems) ii. Take with food/snack around the clock for 1-2 weeks iii. This helps the muscle and nerve tissues become less irritable and calm down faster  3. Use a Heating pad or Ice/Cold Pack a. 4-6 times a day b. May use warm bath/hottub  or showers  4. Try Gentle Massage and/or Stretching  a. at the area of pain many times a day b. stop if you feel pain - do not overdo it  Try these steps together to help you body heal faster and avoid making things get worse.  Doing just one of these things may not be enough.    If you are not getting better after two weeks or are noticing you are getting worse, contact our office for further advice; we may need to re-evaluate you & see what other things we can do to help.

## 2018-06-09 NOTE — Care Management Important Message (Signed)
Important Message  Patient Details  Name: Calvin Burnett MRN: [id number] Date of Birth: [birth year]   Medicare Important Message Given:  Yes    Cung Masterson P Caiden Monsivais 06/09/2018, 3:58 PM
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
## 2018-06-09 NOTE — Progress Notes (Signed)
Patient is asking when he can leave. Worried about insurance coverage and payment. States that Dr. Johney Maine told him on Sunday that he was leaving today.   Nurse called [facility] to inquire about care plan and discharge.

## 2018-06-10 ENCOUNTER — Telehealth: Payer: Self-pay | Admitting: *Deleted

## 2018-06-10 ENCOUNTER — Telehealth: Payer: Self-pay

## 2018-06-10 DIAGNOSIS — R338 Other retention of urine: Secondary | ICD-10-CM | POA: Diagnosis not present

## 2018-06-10 DIAGNOSIS — R109 Unspecified abdominal pain: Secondary | ICD-10-CM | POA: Diagnosis not present

## 2018-06-10 DIAGNOSIS — C61 Malignant neoplasm of prostate: Secondary | ICD-10-CM | POA: Diagnosis not present

## 2018-06-10 NOTE — Telephone Encounter (Signed)
Sounds like patient needs to be evaluated- please advise office visit for him

## 2018-06-10 NOTE — Telephone Encounter (Signed)
Patient walked in office today with daughter and son in law. Patient states that he is having abdominal swelling denies any pain or nausea or vomiting. He denies any fever or chills. Patient states " I think I would feel better if I could get urine out". He is not able to tell me when he last was able to urinate. Thinks it was when they placed a cath the last day he was in hospital. He did drink a lot of water when he came home last nigh and this morning had a 1/2 cup of coffee. Not sure when his last urination or bowel movement were.  He was discharged from Hospital yesterday. He was treated for Bowel Perforation. He has b/l pitting edema on both legs. Patient does have some issues with confusion when talking to him today. Daughter admits that this is increased over night.  Gave all information to [name]. He recommended that patient returned to ED. I have reviewed with family and patient and they have agreed and will take him now. Instructed them to call office for follow up with Dr Yong Channel after he is released.

## 2018-06-10 NOTE — Telephone Encounter (Signed)
Thank you-patient does need to visit-we can decide on GI referral at that time.

## 2018-06-10 NOTE — Telephone Encounter (Addendum)
Received request for patient to receive GI referral. Once I looked into chart it appeared that patient did not go to ED. I called that patient and he states he is at home alone and did not go to the ED. Patient confused with responses, states he was supposed to go to urology today and not ED. I explained that he came to Calvin Burnett office today but was instructed to go to ED. He states his daughter is not there currently. I then tried to call patients daughter but had to leave a voicemail. Called and spoke to patient again, he states he saw urologist today. [name] to call his sister Calvin Burnett. I called Calvin Burnett and she states he saw Calvin Burnett today. States that he has been able to urinate and Calvin Burnett did not think he needed to go to the ED. They request that he be referred to GI doctor. They also requested patient come in to see Calvin Burnett. Appt made for next week. I advised Calvin Burnett and Calvin Burnett that if patients starts experiencing any new or worsening symptoms including shortness of breath, abdominal pain, chest pain, worsening swelling, inability to urinate, that he should go straight to the ED. Both stated their understanding. Calvin Burnett said that she is on her way to check on patient and will keep a close eye on him. I explained that our office is closed tomorrow until Monday and they should go to ED if needed.

## 2018-06-10 NOTE — Telephone Encounter (Signed)
Copied from [place] [id number]. Topic: Referral - Request for Referral >> Jun 10, 2018  2:43 PM Bea Graff, NT wrote: Has patient seen PCP for this complaint? No. *If NO, is insurance requiring patient see PCP for this issue before PCP can refer them? Referral for which specialty: Gastroenterologist  Preferred provider/office: any that can see him soon Reason for referral: swollen abdomen, trouble using restroom

## 2018-06-17 NOTE — Telephone Encounter (Signed)
Patient is scheduled for 06/18/2018

## 2018-06-18 ENCOUNTER — Inpatient Hospital Stay: Payer: PPO | Admitting: Family Medicine

## 2018-06-22 ENCOUNTER — Ambulatory Visit (INDEPENDENT_AMBULATORY_CARE_PROVIDER_SITE_OTHER): Payer: PPO | Admitting: Family Medicine

## 2018-06-22 ENCOUNTER — Encounter: Payer: Self-pay | Admitting: Family Medicine

## 2018-06-22 VITALS — BP 136/88 | HR 82 | Temp 97.5°F | Ht 70.0 in | Wt 175.8 lb

## 2018-06-22 DIAGNOSIS — I10 Essential (primary) hypertension: Secondary | ICD-10-CM | POA: Diagnosis not present

## 2018-06-22 DIAGNOSIS — R531 Weakness: Secondary | ICD-10-CM | POA: Diagnosis not present

## 2018-06-22 DIAGNOSIS — K668 Other specified disorders of peritoneum: Secondary | ICD-10-CM

## 2018-06-22 DIAGNOSIS — E119 Type 2 diabetes mellitus without complications: Secondary | ICD-10-CM

## 2018-06-22 LAB — COMPREHENSIVE METABOLIC PANEL
ALT: 18 U/L (ref 0–53)
AST: 24 U/L (ref 0–37)
Albumin: 4 g/dL (ref 3.5–5.2)
Alkaline Phosphatase: 87 U/L (ref 39–117)
BUN: 16 mg/dL (ref 6–23)
CALCIUM: 9.2 mg/dL (ref 8.4–10.5)
CHLORIDE: 103 meq/L (ref 96–112)
CO2: 30 mEq/L (ref 19–32)
Creatinine, Ser: 1.34 mg/dL (ref 0.40–1.50)
GFR: 54.17 mL/min — AB (ref >60.00)
GLUCOSE: 107 mg/dL — AB (ref 70–99)
Potassium: 4.3 mEq/L (ref 3.5–5.1)
Sodium: 140 mEq/L (ref 135–145)
Total Bilirubin: 0.5 mg/dL (ref 0.2–1.2)
Total Protein: 7.1 g/dL (ref 6.0–8.3)

## 2018-06-22 LAB — CBC
HCT: 38.1 % — ABNORMAL LOW (ref 39.0–52.0)
HEMOGLOBIN: 12.5 g/dL — AB (ref 13.0–17.0)
MCHC: 32.8 g/dL (ref 30.0–36.0)
MCV: 79.7 fl (ref 78.0–100.0)
PLATELETS: 468 10*3/uL — AB (ref 150.0–400.0)
RBC: 4.78 Mil/uL (ref 4.22–5.81)
RDW: 22.3 % — AB (ref 11.5–15.5)
WBC: 6 10*3/uL (ref 4.0–10.5)

## 2018-06-22 LAB — HEMOGLOBIN A1C: Hgb A1c MFr Bld: 8.1 % — ABNORMAL HIGH (ref 4.6–6.5)

## 2018-06-22 NOTE — Patient Instructions (Signed)
Health Maintenance Due  Topic Date Due  . TETANUS/TDAP- get when you are feeling better from your pharmacy 07/15/2016  . OPHTHALMOLOGY EXAM-team please update me on plan for this 01/13/2018  . FOOT EXAM-team please complete today 03/05/2018   Please stop by the front desk to schedule a visit with our physical therapist Dr. Kayleen Memos- she will take excellent care of you for your generalized weakness.   See me back immediately if any fever, abdominal pain, nausea, vomiting for reevaluation    Please stop by lab before you go

## 2018-06-22 NOTE — Assessment & Plan Note (Signed)
S: Discovered poorly controlled on no medication after the visit Lab Results  Component Value Date   HGBA1C 8.1 (H) 06/22/2018   HGBA1C 6.5 12/04/2017   HGBA1C 6.8 (H) 11/25/2017    A/P: Patient has had some GI upset in the past on metformin-we will trial extended release 500 mg-primarily try to get A1c under 8

## 2018-06-22 NOTE — Progress Notes (Signed)
Subjective:  Calvin Burnett is a 84 y.o. year old very pleasant male patient who presents for/with See problem oriented charting ROS-no fever, chills, nausea, vomiting.  No abdominal pain reported.  No chest pain or shortness of breath reported.  Past Medical History-  Patient Active Problem List   Diagnosis Date Noted  . B12 deficiency 12/04/2017    Priority: High  . pT1pN0 colon cancer s/p robotic right colectomy 11/26/2017 10/02/2017    Priority: High  . Paroxysmal atrial fibrillation ([place]) 04/09/2017    Priority: High  . Night sweats 09/16/2016    Priority: High  . Major depressive disorder with single episode, in full remission ([place]) 07/29/2016    Priority: High  . Rectal pain 10/14/2014    Priority: High  . Prostate cancer ([place]) 08/12/2012    Priority: High  . Diabetes mellitus type II, controlled ([place]) 07/31/2010    Priority: High  . Vascular dementia ([place]) 07/27/2008    Priority: High  . History of adenomatous polyp of colon 10/27/2014    Priority: Medium  . Syncope 09/27/2014    Priority: Medium  . CKD (chronic kidney disease), stage III ([place]) 06/01/2014    Priority: Medium  . Dyslipidemia 03/01/2014    Priority: Medium  . Basal cell carcinoma 05/03/2009    Priority: Medium  . Essential hypertension 01/30/2007    Priority: Medium  . Stricture of sigmoid s/p robotic sigmoidectomy 11/26/2017 11/26/2017    Priority: Low  . Chronic anticoagulation 11/26/2017    Priority: Low  . Right inguinal hernia 10/02/2017    Priority: Low  . Bowel habit changes 10/14/2014    Priority: Low  . Bradycardia 09/27/2014    Priority: Low  . LEG CRAMPS, NOCTURNAL 04/30/2010    Priority: Low  . Cervical spondylosis without myelopathy 12/11/2007    Priority: Low  . GERD 11/05/2007    Priority: Low  . Pneumoperitoneum of unknown etiology 06/03/2018    Medications- reviewed and updated Current Outpatient Medications  Medication Sig Dispense Refill  . acetaminophen (TYLENOL) 500  MG tablet Take 500 mg by mouth daily as needed for moderate pain or headache.    Marland Kitchen apixaban (ELIQUIS) 5 MG TABS tablet Take 1 tablet (5 mg total) by mouth 2 (two) times daily. 60 tablet 11  . latanoprost (XALATAN) 0.005 % ophthalmic solution PLACE 1 DROP INTO BOTH EYES NIGHTLY.  12  . MELATONIN PO Take 1 tablet by mouth at bedtime.     . memantine (NAMENDA XR) 28 MG CP24 24 hr capsule Take 1 capsule (28 mg total) by mouth daily. 90 capsule 3  . metoprolol succinate (TOPROL-XL) 25 MG 24 hr tablet Take 12.5 mg by mouth daily.  5   Objective: BP 136/88 (BP Location: Left Arm, Patient Position: Sitting, Cuff Size: Large)   Pulse 82   Temp (!) 97.5 F (36.4 C) (Oral)   Ht 5\' 10"  (1.778 m)   Wt 175 lb 12.8 oz (79.7 kg)   SpO2 97%   BMI 25.22 kg/m  Gen: NAD, resting comfortably.  Brother with him today and support CV: RRR no murmurs rubs or gallops Lungs: CTAB no crackles, wheeze, rhonchi Abdomen: soft/nontender-other than mild diffuse lower abdominal tenderness with deep palpation/nondistended/normal bowel sounds. No rebound or guarding.  Ext: no edema Skin: warm, dry  Assessment/Plan:  Pneumoperitoneum S: Patient presented with severe abdominal pain, nausea, vomiting and CT scan showed several dots of free air concerning for perforated bowel/pneumoperitoneum. History of prior colon resections for right sided colon  cancer and associated stricture 6 months ago. Plan was emergent surgery but patient had severe reaction to [place] listed as "short of breath and bright red" and improved within minutes of stopping infusion.  He was placed on antibiotics and bowels were rested and fortunately he did well with this treatment over surgery. He improved and eventually had bowel function.   Today, patient states no abdominal pain. Still just feels fatigued A/P: Stable/much improved clinically- no need for repeat imaging. Gave return precautions and we will update labs today as below  He does have some  generalized weakness since hospitalization- will refer him to physical therapy for strengthening exercises  Diabetes mellitus type II, controlled ([place]) S: Discovered poorly controlled on no medication after the visit Lab Results  Component Value Date   HGBA1C 8.1 (H) 06/22/2018   HGBA1C 6.5 12/04/2017   HGBA1C 6.8 (H) 11/25/2017    A/P: Patient has had some GI upset in the past on metformin-we will trial extended release 500 mg-primarily try to get A1c under 8  Essential hypertension S: controlled on metoprolol 12.5 mg XRonly though high normal BP Readings from Last 3 Encounters:  06/22/18 136/88  06/09/18 128/83  06/03/18 116/80  A/P: We discussed blood pressure goal of <140/90. Continue current meds  Future Appointments  Date Time Provider [place]  06/23/2018  2:30 PM Lyndee Hensen, PT [facility]  03/23/2019  2:00 PM [facility] HEALTH COACH [facility]   Lab/Order associations: Pneumoperitoneum - Plan: Ambulatory referral to Physical Therapy  Controlled type 2 diabetes mellitus without complication, without long-term current use of insulin ([name]) - Plan: CBC, Comprehensive metabolic panel, Hemoglobin A1c  Generalized weakness - Plan: Ambulatory referral to Physical Therapy  Essential hypertension  Return precautions advised.  [name], MD

## 2018-06-23 ENCOUNTER — Encounter: Payer: Self-pay | Admitting: Physical Therapy

## 2018-06-23 ENCOUNTER — Ambulatory Visit (INDEPENDENT_AMBULATORY_CARE_PROVIDER_SITE_OTHER): Payer: PPO | Admitting: Physical Therapy

## 2018-06-23 DIAGNOSIS — R2689 Other abnormalities of gait and mobility: Secondary | ICD-10-CM | POA: Diagnosis not present

## 2018-06-23 DIAGNOSIS — M6281 Muscle weakness (generalized): Secondary | ICD-10-CM | POA: Diagnosis not present

## 2018-06-23 NOTE — Assessment & Plan Note (Signed)
S: controlled on metoprolol 12.5 mg XRonly though high normal BP Readings from Last 3 Encounters:  06/22/18 136/88  06/09/18 128/83  06/03/18 116/80  A/P: We discussed blood pressure goal of <140/90. Continue current meds
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
## 2018-06-25 ENCOUNTER — Ambulatory Visit: Payer: PPO | Admitting: Physical Therapy

## 2018-06-25 ENCOUNTER — Encounter: Payer: Self-pay | Admitting: Physical Therapy

## 2018-06-25 DIAGNOSIS — R2689 Other abnormalities of gait and mobility: Secondary | ICD-10-CM

## 2018-06-25 DIAGNOSIS — M6281 Muscle weakness (generalized): Secondary | ICD-10-CM
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
## 2018-06-26 ENCOUNTER — Encounter: Payer: Self-pay | Admitting: Family Medicine

## 2018-06-26 ENCOUNTER — Ambulatory Visit (INDEPENDENT_AMBULATORY_CARE_PROVIDER_SITE_OTHER): Payer: PPO | Admitting: Family Medicine

## 2018-06-26 VITALS — BP 158/98 | HR 64 | Ht 70.0 in | Wt 173.4 lb

## 2018-06-26 DIAGNOSIS — E119 Type 2 diabetes mellitus without complications: Secondary | ICD-10-CM

## 2018-06-26 DIAGNOSIS — I10 Essential (primary) hypertension: Secondary | ICD-10-CM | POA: Diagnosis not present

## 2018-06-26 MED ORDER — AMLODIPINE BESYLATE 5 MG PO TABS: 5.0000 mg | ORAL_TABLET | Freq: Every day | ORAL | 5 refills | Status: DC

## 2018-06-26 MED ORDER — METFORMIN HCL ER 500 MG PO TB24: 500.0000 mg | ORAL_TABLET | Freq: Every day | ORAL | 5 refills | Status: DC

## 2018-06-26 NOTE — Assessment & Plan Note (Signed)
S:A1c up to 8.1 .  Poor control.  In the past had GI issues on metformin instant release. A/P: Poor control-- start metformin extended release

## 2018-06-26 NOTE — Progress Notes (Signed)
Subjective:  Calvin Burnett is a 84 y.o. year old very pleasant male patient who presents for/with See problem oriented charting ROS- No chest pain or shortness of breath. No headache. Apparently had some blurry vision a few weeks ago but has cleard up    Past Medical History-  Patient Active Problem List   Diagnosis Date Noted  . B12 deficiency 12/04/2017    Priority: High  . pT1pN0 colon cancer s/p robotic right colectomy 11/26/2017 10/02/2017    Priority: High  . Paroxysmal atrial fibrillation ([place]) 04/09/2017    Priority: High  . Night sweats 09/16/2016    Priority: High  . Major depressive disorder with single episode, in full remission ([place]) 07/29/2016    Priority: High  . Rectal pain 10/14/2014    Priority: High  . Prostate cancer ([place]) 08/12/2012    Priority: High  . Diabetes mellitus type II, controlled ([place]) 07/31/2010    Priority: High  . Vascular dementia ([place]) 07/27/2008    Priority: High  . History of adenomatous polyp of colon 10/27/2014    Priority: Medium  . Syncope 09/27/2014    Priority: Medium  . CKD (chronic kidney disease), stage III ([place]) 06/01/2014    Priority: Medium  . Dyslipidemia 03/01/2014    Priority: Medium  . Basal cell carcinoma 05/03/2009    Priority: Medium  . Essential hypertension 01/30/2007    Priority: Medium  . Stricture of sigmoid s/p robotic sigmoidectomy 11/26/2017 11/26/2017    Priority: Low  . Chronic anticoagulation 11/26/2017    Priority: Low  . Right inguinal hernia 10/02/2017    Priority: Low  . Bowel habit changes 10/14/2014    Priority: Low  . Bradycardia 09/27/2014    Priority: Low  . LEG CRAMPS, NOCTURNAL 04/30/2010    Priority: Low  . Cervical spondylosis without myelopathy 12/11/2007    Priority: Low  . GERD 11/05/2007    Priority: Low  . Pneumoperitoneum of unknown etiology 06/03/2018    Medications- reviewed and updated Current Outpatient Medications  Medication Sig Dispense Refill  . acetaminophen  (TYLENOL) 500 MG tablet Take 500 mg by mouth daily as needed for moderate pain or headache.    Marland Kitchen apixaban (ELIQUIS) 5 MG TABS tablet Take 1 tablet (5 mg total) by mouth 2 (two) times daily. 60 tablet 11  . latanoprost (XALATAN) 0.005 % ophthalmic solution PLACE 1 DROP INTO BOTH EYES NIGHTLY.  12  . MELATONIN PO Take 1 tablet by mouth at bedtime.     . memantine (NAMENDA XR) 28 MG CP24 24 hr capsule Take 1 capsule (28 mg total) by mouth daily. 90 capsule 3  . metoprolol succinate (TOPROL-XL) 25 MG 24 hr tablet Take 12.5 mg by mouth daily.  5  . amLODipine (NORVASC) 5 MG tablet Take 1 tablet (5 mg total) by mouth daily. 30 tablet 5  . metFORMIN (GLUCOPHAGE-XR) 500 MG 24 hr tablet Take 1 tablet (500 mg total) by mouth daily with breakfast. 30 tablet 5   No current facility-administered medications for this visit.     Objective: BP (!) 158/98 (BP Location: Left Arm, Patient Position: Sitting, Cuff Size: Normal)   Pulse 64   Ht 5\' 10"  (1.778 m)   Wt 173 lb 6.4 oz (78.7 kg)   SpO2 97%   BMI 24.88 kg/m  Gen: NAD, resting comfortably CV: RRR no murmurs rubs or gallops Lungs: CTAB no crackles, wheeze, rhonchi Ext: no edema Skin: warm, dry  Diabetic Foot Exam - Simple   Simple  Foot Form Diabetic Foot exam was performed with the following findings:  Yes 06/26/2018  1:56 PM  Visual Inspection No deformities, no ulcerations, no other skin breakdown bilaterally:  Yes Sensation Testing Intact to touch and monofilament testing bilaterally:  Yes Pulse Check Posterior Tibialis and Dorsalis pulse intact bilaterally:  Yes Comments    Assessment/Plan:  Essential hypertension S: Poorly controlled on metoprolol 12.5 mg extended release-primarily for atrial fibrillation.  Blood pressure was trending up last visit with me then in physical therapy patient was getting numbers into the 150s and 160s at least.  Physical therapy advised follow-up office visit with me-blood pressure very elevated today.   Patient has been eating out a lot with [employer] and eating at a cafeteria BP Readings from Last 3 Encounters:  06/26/18 (!) 158/98  06/22/18 136/88  06/09/18 128/83  A/P: We discussed blood pressure goal of <140/90. Continue current meds: But start amlodipine 5 mg with follow-up next week recommended-asked him to schedule next Tuesday at 1 PM before he leaves.    Diabetes mellitus type II, controlled ([place]) S:A1c up to 8.1 .  Poor control.  In the past had GI issues on metformin instant release. A/P: Poor control-- start metformin extended release  Future Appointments  Date Time Provider [place]  06/30/2018 11:00 AM Lyndee Hensen, PT [facility]  07/02/2018 11:00 AM Lyndee Hensen, PT [facility]  07/07/2018 11:00 AM Lyndee Hensen, PT [facility]  07/10/2018 11:00 AM Lyndee Hensen, PT [facility]  07/14/2018 11:00 AM Lyndee Hensen, PT [facility]  07/16/2018 11:00 AM Lyndee Hensen, PT [facility]  03/23/2019  2:00 PM [facility] HEALTH COACH [facility]   Meds ordered this encounter  Medications  . metFORMIN (GLUCOPHAGE-XR) 500 MG 24 hr tablet    Sig: Take 1 tablet (500 mg total) by mouth daily with breakfast.    Dispense:  30 tablet    Refill:  5  . amLODipine (NORVASC) 5 MG tablet    Sig: Take 1 tablet (5 mg total) by mouth daily.    Dispense:  30 tablet    Refill:  5   Return precautions advised.  [name], MD

## 2018-06-26 NOTE — Assessment & Plan Note (Addendum)
S: Poorly controlled on metoprolol 12.5 mg extended release-primarily for atrial fibrillation.  Blood pressure was trending up last visit with me then in physical therapy patient was getting numbers into the 150s and 160s at least.  Physical therapy advised follow-up office visit with me-blood pressure very elevated today.  Patient has been eating out a lot with [employer] and eating at a cafeteria BP Readings from Last 3 Encounters:  06/26/18 (!) 158/98  06/22/18 136/88  06/09/18 128/83  A/P: We discussed blood pressure goal of <140/90. Continue current meds: But start amlodipine 5 mg with follow-up next week recommended-asked him to schedule next Tuesday at 1 PM before he leaves.

## 2018-06-26 NOTE — Patient Instructions (Addendum)
Health Maintenance Due  Topic Date Due  . TETANUS/TDAP - unsure of last Tdap, maybe 2008 07/15/2016  . [place]- scheduled for 10/05/2018 at 10:20 AM 01/13/2018  . FOOT EXAM  03/05/2018   Schedule a visit with me next Tuesday at 1 pm in same day slot  Start taking amlodipine 5 mg which I will send in today.   We are also sending in metformin because your diabetes is no longer well controlled.

## 2018-06-30 ENCOUNTER — Ambulatory Visit (INDEPENDENT_AMBULATORY_CARE_PROVIDER_SITE_OTHER): Payer: PPO | Admitting: Family Medicine

## 2018-06-30 ENCOUNTER — Encounter: Payer: PPO | Admitting: Physical Therapy

## 2018-06-30 ENCOUNTER — Encounter: Payer: Self-pay | Admitting: Family Medicine

## 2018-06-30 VITALS — BP 122/76 | HR 77 | Temp 98.3°F | Ht 70.0 in | Wt 174.2 lb

## 2018-06-30 DIAGNOSIS — I10 Essential (primary) hypertension: Secondary | ICD-10-CM | POA: Diagnosis not present

## 2018-06-30 DIAGNOSIS — E119 Type 2 diabetes mellitus without complications: Secondary | ICD-10-CM

## 2018-06-30 DIAGNOSIS — Z0289 Encounter for other administrative examinations: Secondary | ICD-10-CM

## 2018-06-30 NOTE — Assessment & Plan Note (Signed)
S: started metformin XR 500mg  last visit due to a1c up to 8.1 and hopes to keep under 8.  Patient has tolerated medication well-denies loose stools like he had on instant release in the past  A/P: doing well- will schedule 3 month follow up to repeat a1c

## 2018-06-30 NOTE — Progress Notes (Signed)
Subjective:  Calvin Burnett is a 84 y.o. year old very pleasant male patient who presents for/with See problem oriented charting ROS- No chest pain or shortness of breath. No headache or blurry vision.  No reported hypoglycemia  Past Medical History-  Patient Active Problem List   Diagnosis Date Noted  . B12 deficiency 12/04/2017    Priority: High  . pT1pN0 colon cancer s/p robotic right colectomy 11/26/2017 10/02/2017    Priority: High  . Paroxysmal atrial fibrillation ([place]) 04/09/2017    Priority: High  . Night sweats 09/16/2016    Priority: High  . Major depressive disorder with single episode, in full remission ([place]) 07/29/2016    Priority: High  . Rectal pain 10/14/2014    Priority: High  . Prostate cancer ([place]) 08/12/2012    Priority: High  . Diabetes mellitus type II, controlled ([place]) 07/31/2010    Priority: High  . Vascular dementia ([place]) 07/27/2008    Priority: High  . History of adenomatous polyp of colon 10/27/2014    Priority: Medium  . Syncope 09/27/2014    Priority: Medium  . CKD (chronic kidney disease), stage III ([place]) 06/01/2014    Priority: Medium  . Dyslipidemia 03/01/2014    Priority: Medium  . Basal cell carcinoma 05/03/2009    Priority: Medium  . Essential hypertension 01/30/2007    Priority: Medium  . Stricture of sigmoid s/p robotic sigmoidectomy 11/26/2017 11/26/2017    Priority: Low  . Chronic anticoagulation 11/26/2017    Priority: Low  . Right inguinal hernia 10/02/2017    Priority: Low  . Bowel habit changes 10/14/2014    Priority: Low  . Bradycardia 09/27/2014    Priority: Low  . LEG CRAMPS, NOCTURNAL 04/30/2010    Priority: Low  . Cervical spondylosis without myelopathy 12/11/2007    Priority: Low  . GERD 11/05/2007    Priority: Low  . Pneumoperitoneum of unknown etiology 06/03/2018    Medications- reviewed and updated Current Outpatient Medications  Medication Sig Dispense Refill  . acetaminophen (TYLENOL) 500 MG tablet Take  500 mg by mouth daily as needed for moderate pain or headache.    Marland Kitchen amLODipine (NORVASC) 5 MG tablet Take 1 tablet (5 mg total) by mouth daily. 30 tablet 5  . apixaban (ELIQUIS) 5 MG TABS tablet Take 1 tablet (5 mg total) by mouth 2 (two) times daily. 60 tablet 11  . latanoprost (XALATAN) 0.005 % ophthalmic solution PLACE 1 DROP INTO BOTH EYES NIGHTLY.  12  . MELATONIN PO Take 1 tablet by mouth at bedtime.     . memantine (NAMENDA XR) 28 MG CP24 24 hr capsule Take 1 capsule (28 mg total) by mouth daily. 90 capsule 3  . metFORMIN (GLUCOPHAGE-XR) 500 MG 24 hr tablet Take 1 tablet (500 mg total) by mouth daily with breakfast. 30 tablet 5  . metoprolol succinate (TOPROL-XL) 25 MG 24 hr tablet Take 12.5 mg by mouth daily.  5   No current facility-administered medications for this visit.     Objective: BP 122/76 (BP Location: Left Arm, Patient Position: Sitting, Cuff Size: Large)   Pulse 77   Temp 98.3 F (36.8 C) (Oral)   Ht 5\' 10"  (1.778 m)   Wt 174 lb 3.2 oz (79 kg)   SpO2 98%   BMI 25.00 kg/m  Gen: NAD, resting comfortably CV: RRR no murmurs rubs or gallops Lungs: CTAB no crackles, wheeze, rhonchi Abdomen: soft/nontender/nondistended Ext: no edema Skin: warm, dry  Assessment/Plan:  Essential hypertension S: controlled on  amlodipine 5 mg, metoprolol 12.5mg  XR BP Readings from Last 3 Encounters:  06/30/18 122/76  06/26/18 (!) 158/98  06/22/18 136/88  A/P: We discussed blood pressure goal of <140/90. Continue current meds: Including recently added amlodipine 5 mg  Diabetes mellitus type II, controlled ([place]) S: started metformin XR 500mg  last visit due to a1c up to 8.1 and hopes to keep under 8.  Patient has tolerated medication well-denies loose stools like he had on instant release in the past  A/P: doing well- will schedule 3 month follow up to repeat a1c  Future Appointments  Date Time Provider [place]  07/02/2018 11:00 AM Lyndee Hensen, PT [facility]   07/07/2018 11:00 AM Lyndee Hensen, PT [facility]  07/10/2018 11:00 AM Lyndee Hensen, PT [facility]  07/14/2018 11:00 AM Lyndee Hensen, PT [facility]  07/16/2018 11:00 AM Lyndee Hensen, PT [facility]  03/23/2019  2:00 PM [facility] HEALTH COACH [facility]   3 months verbal  Return precautions advised.  [name], MD

## 2018-06-30 NOTE — Patient Instructions (Signed)
Blood pressure looks much better! Continue current medicines  Glad you are doing well with metformin.   Lets check in 3 months from now to recheck diabetes and blood pressure- happy to see you sooner if needed

## 2018-06-30 NOTE — Assessment & Plan Note (Signed)
S: controlled on amlodipine 5 mg, metoprolol 12.5mg  XR BP Readings from Last 3 Encounters:  06/30/18 122/76  06/26/18 (!) 158/98  06/22/18 136/88  A/P: We discussed blood pressure goal of <140/90. Continue current meds: Including recently added amlodipine 5 mg

## 2018-07-02 ENCOUNTER — Encounter: Payer: PPO | Admitting: Physical Therapy

## 2018-07-02 DIAGNOSIS — C61 Malignant neoplasm of prostate: Secondary | ICD-10-CM | POA: Diagnosis not present

## 2018-07-02 LAB — PSA: PSA: 0.025

## 2018-07-07 ENCOUNTER — Ambulatory Visit (INDEPENDENT_AMBULATORY_CARE_PROVIDER_SITE_OTHER): Payer: PPO | Admitting: Family Medicine

## 2018-07-07 ENCOUNTER — Encounter: Payer: PPO | Admitting: Physical Therapy

## 2018-07-07 ENCOUNTER — Encounter: Payer: Self-pay | Admitting: Family Medicine

## 2018-07-07 VITALS — BP 122/74 | HR 76 | Temp 98.0°F | Ht 70.0 in | Wt 173.6 lb

## 2018-07-07 DIAGNOSIS — J029 Acute pharyngitis, unspecified: Secondary | ICD-10-CM | POA: Diagnosis not present

## 2018-07-07 LAB — POCT RAPID STREP A (OFFICE): Rapid Strep A Screen: NEGATIVE

## 2018-07-07 MED ORDER — AZITHROMYCIN 250 MG PO TABS: ORAL_TABLET | ORAL | 0 refills | Status: DC

## 2018-07-07 MED ORDER — IPRATROPIUM BROMIDE 0.06 % NA SOLN: 2.0000 | Freq: Four times a day (QID) | NASAL | 0 refills | Status: DC

## 2018-07-07 MED ORDER — METHYLPREDNISOLONE ACETATE 80 MG/ML IJ SUSP
80.0000 mg | Freq: Once | INTRAMUSCULAR | Status: AC
  Administered 2018-07-07: 80 mg via INTRAMUSCULAR

## 2018-07-07 NOTE — Patient Instructions (Signed)
Start the atrovent.  We will give you an anti-inflammatory steroid shot today.   Start the zpack if your symptoms worsen or do not improve in a few days.  Please stay well hydrated.  You can take tylenol as needed for low grade fever and pain.  Please let me know if your symptoms worsen or fail to improve.  Take care, Dr Jerline Pain

## 2018-07-07 NOTE — Progress Notes (Signed)
   Subjective:  Calvin Burnett is a 84 y.o. male who presents today for same-day appointment with a chief complaint of sore throat.   HPI:  Sore Throat, Acute problem Started 4-5 days ago.  Worsened over that time.  Associated with postnasal drip and dry cough.  Symptoms are worse in the morning and at night.  Tried Vicks rub which is helped some.  NyQuil is also had modest improvement.  No known sick contacts.  No fevers or chills.  No sputum production.  No shortness of breath.  No other obvious alleviating or aggravating factors.  ROS: Per HPI  PMH: He reports that he quit smoking about 27 years ago. His smoking use included cigars. He has a 2.00 pack-year smoking history. He has never used smokeless tobacco. He reports that he does not drink alcohol or use drugs.  Objective:  Physical Exam: BP 122/74 (BP Location: Left Arm, Patient Position: Sitting, Cuff Size: Normal)   Pulse 76   Temp 98 F (36.7 C) (Oral)   Ht 5\' 10"  (1.778 m)   Wt 173 lb 9.6 oz (78.7 kg)   SpO2 97%   BMI 24.91 kg/m   Gen: NAD, resting comfortably HEENT: TMs clear.  OP slightly erythematous with no exudates.  His mucosa boggy and erythematous bilaterally. CV: RRR with no murmurs appreciated Pulm: NWOB, CTAB with no crackles, wheezes, or rhonchi  Assessment/Plan:  Sore throat Likely secondary to viral URI. No signs of bacterial infection.  Rapid strep negative.  Start atrovent for rhinorrhea/sinus congestion. Will give 80mg  IM depo-medrol for sore throat. Sent in a "pocket prescription" for azithromycin with strict instruction to not start unless symptoms worsen or fail to improve within the next several days. Recommended tylenol as needed for low grade fever and pain. Encouraged good oral hydration. Return precautions reviewed. Follow up as needed.   [name]. Jerline Pain, MD 07/07/2018 11:36 AM

## 2018-07-09 DIAGNOSIS — C61 Malignant neoplasm of prostate: Secondary | ICD-10-CM | POA: Diagnosis not present

## 2018-07-09 DIAGNOSIS — R338 Other retention of urine: Secondary | ICD-10-CM | POA: Diagnosis not present

## 2018-07-10 ENCOUNTER — Ambulatory Visit (INDEPENDENT_AMBULATORY_CARE_PROVIDER_SITE_OTHER): Payer: PPO | Admitting: Physical Therapy

## 2018-07-10 DIAGNOSIS — M6281 Muscle weakness (generalized): Secondary | ICD-10-CM | POA: Diagnosis not present

## 2018-07-10 DIAGNOSIS — R2689 Other abnormalities of gait and mobility: Secondary | ICD-10-CM
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
## 2018-07-10 NOTE — Patient Instructions (Signed)
Access Code: [email or web address]  URL: [email or web address]  Date: 07/10/2018  Prepared by: Lyndee Hensen   Exercises  Standing March with Counter Support - 10 reps - 2 sets - 2x daily  Heel rises with counter support - 10 reps - 1-2 sets - 2x daily  Standing Hip Abduction - 10 reps - 2 sets - 2x daily  Seated Knee Extension AROM - 10 reps - 2 sets - 2x daily  Sit to Stand - 10 reps - 1 sets - 2x daily

## 2018-07-14 ENCOUNTER — Encounter: Payer: Self-pay | Admitting: Physical Therapy

## 2018-07-14 ENCOUNTER — Ambulatory Visit (INDEPENDENT_AMBULATORY_CARE_PROVIDER_SITE_OTHER): Payer: PPO | Admitting: Physical Therapy

## 2018-07-14 DIAGNOSIS — M6281 Muscle weakness (generalized): Secondary | ICD-10-CM | POA: Diagnosis not present

## 2018-07-14 DIAGNOSIS — R2689 Other abnormalities of gait and mobility: Secondary | ICD-10-CM
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
## 2018-07-16 ENCOUNTER — Encounter: Payer: Self-pay | Admitting: Physical Therapy

## 2018-07-16 ENCOUNTER — Ambulatory Visit (INDEPENDENT_AMBULATORY_CARE_PROVIDER_SITE_OTHER): Payer: PPO | Admitting: Physical Therapy

## 2018-07-16 DIAGNOSIS — M6281 Muscle weakness (generalized): Secondary | ICD-10-CM | POA: Diagnosis not present

## 2018-07-16 DIAGNOSIS — R2689 Other abnormalities of gait and mobility: Secondary | ICD-10-CM
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
## 2018-07-21 ENCOUNTER — Ambulatory Visit (INDEPENDENT_AMBULATORY_CARE_PROVIDER_SITE_OTHER): Payer: PPO | Admitting: Physical Therapy

## 2018-07-21 ENCOUNTER — Encounter: Payer: Self-pay | Admitting: Physical Therapy

## 2018-07-21 DIAGNOSIS — R2689 Other abnormalities of gait and mobility: Secondary | ICD-10-CM

## 2018-07-21 DIAGNOSIS — M6281 Muscle weakness (generalized): Secondary | ICD-10-CM | POA: Diagnosis not present
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
## 2018-08-18 ENCOUNTER — Encounter: Payer: Self-pay | Admitting: Family Medicine

## 2018-08-18 ENCOUNTER — Ambulatory Visit (INDEPENDENT_AMBULATORY_CARE_PROVIDER_SITE_OTHER): Payer: PPO | Admitting: Family Medicine

## 2018-08-18 VITALS — BP 108/60 | HR 67 | Temp 97.6°F | Ht 70.0 in | Wt 173.2 lb

## 2018-08-18 DIAGNOSIS — R197 Diarrhea, unspecified: Secondary | ICD-10-CM

## 2018-08-18 NOTE — Patient Instructions (Signed)
It was very nice to see you today!  You probably have a mild viral illness.  Please try taking Imodium 4 times daily as needed.  Please also make sure that you are getting plenty of fiber in your diet.  You can try taking a fiber supplement if needed.  You can also try taking probiotics.  Please make sure that you are getting plenty of fluids.  Let us know if your symptoms do not improve in the next few days we can check a stool sample.  Take care, Dr Jerline Pain

## 2018-08-18 NOTE — Progress Notes (Signed)
   Chief Complaint:  Calvin Burnett is a 85 y.o. male who presents for same day appointment with a chief complaint of diarrhea.   Assessment/Plan:  Diarrhea Likely mild viral illness.  Seems to be improving.  No red flags.  Benign abdominal exam.  Recommended Imodium 4 times daily as needed.  Recommended good oral hydration, diet high in fiber, and probiotic supplementation.  Discussed reasons to return to care.  Follow-up as needed.     Subjective:  HPI:  Diarrhea, acute problem Started 2 days ago. Stable over that time. He has 1-2 episodes of loose stools per day. A little watery.  No treatments tried.  He has had some cramping in his lower abdomen that has improved over the last couple of days. No nausea or vomiting. No fevers or chills. No hematochezia or melena.  No known sick contacts.  ROS: Per HPI      Objective:  Physical Exam: BP 108/60 (BP Location: Left Arm, Patient Position: Sitting, Cuff Size: Normal)   Pulse 67   Temp 97.6 F (36.4 C) (Oral)   Ht 5\' 10"  (1.778 m)   Wt 173 lb 4 oz (78.6 kg)   SpO2 97%   BMI 24.86 kg/m   Gen: NAD, resting comfortably HEENT: Moist mucous membranes CV: Regular rate and rhythm with no murmurs appreciated Pulm: Normal work of breathing, clear to auscultation bilaterally with no crackles, wheezes, or rhonchi GI: Normal bowel sounds present. Soft, Nontender, Nondistended.     [name]. Jerline Pain, MD 08/18/2018 11:30 AM

## 2018-09-15 ENCOUNTER — Encounter: Payer: Self-pay | Admitting: Gastroenterology

## 2018-09-29 ENCOUNTER — Ambulatory Visit: Payer: Self-pay | Admitting: Family Medicine

## 2018-10-09 ENCOUNTER — Other Ambulatory Visit: Payer: Self-pay | Admitting: Family Medicine

## 2018-12-06 ENCOUNTER — Other Ambulatory Visit: Payer: Self-pay | Admitting: Family Medicine

## 2018-12-21 ENCOUNTER — Other Ambulatory Visit: Payer: Self-pay | Admitting: Family Medicine

## 2018-12-31 ENCOUNTER — Encounter: Payer: Self-pay | Admitting: Family Medicine

## 2018-12-31 ENCOUNTER — Ambulatory Visit (INDEPENDENT_AMBULATORY_CARE_PROVIDER_SITE_OTHER): Payer: PPO | Admitting: Family Medicine

## 2018-12-31 ENCOUNTER — Other Ambulatory Visit: Payer: Self-pay

## 2018-12-31 VITALS — BP 136/82 | HR 62 | Temp 97.7°F | Ht 70.0 in | Wt 177.0 lb

## 2018-12-31 DIAGNOSIS — E1122 Type 2 diabetes mellitus with diabetic chronic kidney disease: Secondary | ICD-10-CM

## 2018-12-31 DIAGNOSIS — Z0000 Encounter for general adult medical examination without abnormal findings: Secondary | ICD-10-CM | POA: Diagnosis not present

## 2018-12-31 DIAGNOSIS — N183 Chronic kidney disease, stage 3 unspecified: Secondary | ICD-10-CM

## 2018-12-31 DIAGNOSIS — I480 Paroxysmal atrial fibrillation: Secondary | ICD-10-CM | POA: Diagnosis not present

## 2018-12-31 DIAGNOSIS — E538 Deficiency of other specified B group vitamins: Secondary | ICD-10-CM | POA: Diagnosis not present

## 2018-12-31 DIAGNOSIS — F325 Major depressive disorder, single episode, in full remission: Secondary | ICD-10-CM

## 2018-12-31 DIAGNOSIS — F0150 Vascular dementia without behavioral disturbance: Secondary | ICD-10-CM | POA: Diagnosis not present

## 2018-12-31 DIAGNOSIS — E663 Overweight: Secondary | ICD-10-CM | POA: Diagnosis not present

## 2018-12-31 DIAGNOSIS — C61 Malignant neoplasm of prostate: Secondary | ICD-10-CM | POA: Diagnosis not present

## 2018-12-31 DIAGNOSIS — I10 Essential (primary) hypertension: Secondary | ICD-10-CM | POA: Diagnosis not present

## 2018-12-31 DIAGNOSIS — C183 Malignant neoplasm of hepatic flexure: Secondary | ICD-10-CM

## 2018-12-31 DIAGNOSIS — E1159 Type 2 diabetes mellitus with other circulatory complications: Secondary | ICD-10-CM | POA: Diagnosis not present

## 2018-12-31 LAB — COMPREHENSIVE METABOLIC PANEL
ALT: 25 U/L (ref 0–53)
AST: 24 U/L (ref 0–37)
Albumin: 4.1 g/dL (ref 3.5–5.2)
Alkaline Phosphatase: 117 U/L (ref 39–117)
BUN: 21 mg/dL (ref 6–23)
CO2: 28 mEq/L (ref 19–32)
Calcium: 9 mg/dL (ref 8.4–10.5)
Chloride: 105 mEq/L (ref 96–112)
Creatinine, Ser: 1.51 mg/dL — ABNORMAL HIGH (ref 0.40–1.50)
GFR: 44.34 mL/min — ABNORMAL LOW (ref >60.00)
Glucose, Bld: 148 mg/dL — ABNORMAL HIGH (ref 70–99)
Potassium: 4 mEq/L (ref 3.5–5.1)
Sodium: 140 mEq/L (ref 135–145)
Total Bilirubin: 0.7 mg/dL (ref 0.2–1.2)
Total Protein: 7 g/dL (ref 6.0–8.3)

## 2018-12-31 LAB — CBC
HCT: 44 % (ref 39.0–52.0)
Hemoglobin: 15 g/dL (ref 13.0–17.0)
MCHC: 34 g/dL (ref 30.0–36.0)
MCV: 96.3 fl (ref 78.0–100.0)
Platelets: 159 10*3/uL (ref 150.0–400.0)
RBC: 4.57 Mil/uL (ref 4.22–5.81)
RDW: 14.6 % (ref 11.5–15.5)
WBC: 5.9 10*3/uL (ref 4.0–10.5)

## 2018-12-31 LAB — MICROALBUMIN / CREATININE URINE RATIO
Creatinine,U: 89.7 mg/dL
Microalb Creat Ratio: 26.1 mg/g (ref 0.0–30.0)
Microalb, Ur: 23.4 mg/dL — ABNORMAL HIGH (ref 0.0–1.9)

## 2018-12-31 LAB — LIPID PANEL
Cholesterol: 128 mg/dL (ref 0–200)
HDL: 38.9 mg/dL — ABNORMAL LOW (ref >39.00)
LDL Cholesterol: 60 mg/dL (ref 0–99)
NonHDL: 88.91
Total CHOL/HDL Ratio: 3
Triglycerides: 146 mg/dL (ref 0.0–149.0)
VLDL: 29.2 mg/dL (ref 0.0–40.0)

## 2018-12-31 LAB — VITAMIN B12: Vitamin B-12: 236 pg/mL (ref 211–911)

## 2018-12-31 LAB — HEMOGLOBIN A1C: Hgb A1c MFr Bld: 6.7 % — ABNORMAL HIGH (ref 4.6–6.5)

## 2018-12-31 NOTE — Patient Instructions (Addendum)
Health Maintenance Due  Topic Date Due  . TETANUS/TDAP - consider doing this at your pharmacy or doing it here if you get a cut/scrape (they will pay for it here if you get a cut/scrape) 07/15/2016  . COLONOSCOPY - We will call you within two weeks about your referral to GI. If you do not hear within 3 weeks, give [place] a call.   11/27/2018    Please stop by lab before you go If you do not have mychart- we will call you about results within 5 business days of [place] receiving them.  If you have mychart- we will send your results within 3 business days of [place] receiving them.  If abnormal or we want to clarify a result, we will call or mychart you to make sure you receive the message.  If you have questions or concerns or don't hear within 5-7 days, please send [place] a message or call us.
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
## 2019-01-18 ENCOUNTER — Other Ambulatory Visit: Payer: Self-pay | Admitting: Family Medicine

## 2019-01-27 ENCOUNTER — Telehealth: Payer: Self-pay

## 2019-01-27 NOTE — Telephone Encounter (Signed)
Covid-19 screening questions   Do you now or have you had a fever in the last 14 days? No  Do you have any respiratory symptoms of shortness of breath or cough now or in the last 14 days? No  Do you have any family members or close contacts with diagnosed or suspected Covid-19 in the past 14 days? No  Have you been tested for Covid-19 and found to be positive? No        

## 2019-01-28 ENCOUNTER — Ambulatory Visit (INDEPENDENT_AMBULATORY_CARE_PROVIDER_SITE_OTHER): Payer: PPO

## 2019-01-28 ENCOUNTER — Other Ambulatory Visit: Payer: Self-pay

## 2019-01-28 ENCOUNTER — Telehealth: Payer: Self-pay

## 2019-01-28 ENCOUNTER — Encounter: Payer: Self-pay | Admitting: Gastroenterology

## 2019-01-28 ENCOUNTER — Ambulatory Visit (INDEPENDENT_AMBULATORY_CARE_PROVIDER_SITE_OTHER): Payer: PPO | Admitting: Gastroenterology

## 2019-01-28 VITALS — BP 110/80 | HR 56 | Temp 97.9°F | Ht 68.0 in | Wt 177.0 lb

## 2019-01-28 DIAGNOSIS — Z7901 Long term (current) use of anticoagulants: Secondary | ICD-10-CM

## 2019-01-28 DIAGNOSIS — Z23 Encounter for immunization: Secondary | ICD-10-CM | POA: Diagnosis not present

## 2019-01-28 DIAGNOSIS — Z85038 Personal history of other malignant neoplasm of large intestine: Secondary | ICD-10-CM | POA: Diagnosis not present

## 2019-01-28 DIAGNOSIS — S41101A Unspecified open wound of right upper arm, initial encounter: Secondary | ICD-10-CM

## 2019-01-28 MED ORDER — NA SULFATE-K SULFATE-MG SULF 17.5-3.13-1.6 GM/177ML PO SOLN: 1.0000 | Freq: Once | ORAL | 0 refills | Status: AC

## 2019-01-28 NOTE — Progress Notes (Signed)
Per orders of [name], injection of Td injectionb given by Jaella Weinert L Lakhia Gengler in right deltoid. Patient tolerated injection well.

## 2019-01-28 NOTE — Telephone Encounter (Signed)
[facility] Pre-operative Risk Assessment     Request for surgical clearance:     Endoscopy Procedure  What type of surgery is being performed?     Colonoscopy  When is this surgery scheduled?     02/10/19  What type of clearance is required ?   Pharmacy  Are there any medications that need to be held prior to surgery and how long? Eliquis x 2 days  Practice name and name of physician performing surgery?      [facility]  What is your office phone and fax number?      Phone- [phone]  [phone]  Anesthesia type (None, local, MAC, general) ?       MAC

## 2019-01-28 NOTE — Patient Instructions (Signed)
You have been scheduled for a colonoscopy. Please follow written instructions given to you at your visit today.  Please pick up your prep supplies at the pharmacy within the next 1-3 days. If you use inhalers (even only as needed), please bring them with you on the day of your procedure.  Thank you for choosing me and [facility].  Malcolm T. Stark, Jr., MD., FACG  

## 2019-01-28 NOTE — Progress Notes (Signed)
    History of Present Illness: This is an 85 year old male S/P right hemicolectomy for a T1N0 colon adenocarcinoma arising in a 4 cm polyp and sigmoidectomy for a diverticular stricture in May 2019. He is accompanied by his daughter.  He has no ongoing gastrointestinal complaints.  His daughter relates that she is noted some mild ankle swelling the past few days.  The patient is not certain if this is new or has been present for period of time.Denies weight loss, abdominal pain, constipation, diarrhea, change in stool caliber, melena, hematochezia, nausea, vomiting, dysphagia, reflux symptoms, chest pain.    Colonoscopy 08/2017 - Preparation of the colon was fair. - One 40 mm polyp in the distal transverse colon, partially removed with a hot snare. Incomplete resection. Resected tissue retrieved. Injections for tattoos. - One 8 mm polyp in the proximal transverse colon, removed with a cold snare. Resected and retrieved. - Moderate diverticulosis in the left colon. - Internal hemorrhoids. - The examination was otherwise normal on direct and retroflexion views.  Current Medications, Allergies, Past Medical History, Past Surgical History, Family History and Social History were reviewed in [employer] record.   Physical Exam: General: Well developed, well nourished, no acute distress Head: Normocephalic and atraumatic Eyes:  sclerae anicteric, EOMI Ears: Normal auditory acuity Mouth: No deformity or lesions Lungs: Clear throughout to auscultation Heart: Regular rate and rhythm; no murmurs, rubs or bruits Abdomen: Soft, non tender and non distended. No masses, hepatosplenomegaly or hernias noted. Normal Bowel sounds Rectal: Deferred to colonoscopy Musculoskeletal: Symmetrical with no gross deformities  Pulses:  Normal pulses noted Extremities: No clubbing, cyanosis, or deformities noted. 1-2+ pedal, pretibial edema Neurological: Alert oriented x 4, grossly nonfocal  Psychological:  Alert and cooperative. Normal mood and affect   Assessment and Recommendations:  1. Personal history of colon cancer. S/P right hemicolectomy for a T1N0 colon adenocarcinoma arising in a 4 cm colon polyp and sigmoidectomy for a diverticular stricture in May 2019.  We discussed the risks and benefits of surveillance colonoscopy and discussed discontinuing surveillance colonoscopies due to age.  After allowing ample time for discussion and addressing their questions to their satisfaction the patient wants to proceed with colonoscopy.  Schedule colonoscopy. The risks (including bleeding, perforation, infection, missed lesions, medication reactions and possible hospitalization or surgery if complications occur), benefits, and alternatives to colonoscopy with possible biopsy and possible polypectomy were discussed with the patient and they consent to proceed.   2. Hold Eliquis 2 days before procedure - will instruct when and how to resume after procedure. Low but real risk of cardiovascular event such as heart attack, stroke, embolism, thrombosis or ischemia/infarct of other organs off Eliquis explained and need to seek urgent help if this occurs. The patient consents to proceed. Will communicate by phone or EMR with patient's prescribing provider to confirm that holding Eliquis is reasonable in this case.   3.  Mild BLE edema. See PCP

## 2019-01-28 NOTE — Telephone Encounter (Signed)
Does increase stroke risk but I believe benefits outweigh risks so can proceed forward with colonoscopy.

## 2019-01-29 ENCOUNTER — Encounter: Payer: Self-pay | Admitting: Family Medicine

## 2019-01-29 ENCOUNTER — Ambulatory Visit (INDEPENDENT_AMBULATORY_CARE_PROVIDER_SITE_OTHER): Payer: PPO | Admitting: Family Medicine

## 2019-01-29 VITALS — BP 120/84 | HR 56 | Temp 97.7°F | Ht 68.0 in | Wt 176.8 lb

## 2019-01-29 DIAGNOSIS — I10 Essential (primary) hypertension: Secondary | ICD-10-CM | POA: Diagnosis not present

## 2019-01-29 DIAGNOSIS — N183 Chronic kidney disease, stage 3 unspecified: Secondary | ICD-10-CM

## 2019-01-29 DIAGNOSIS — I700 Atherosclerosis of aorta: Secondary | ICD-10-CM

## 2019-01-29 DIAGNOSIS — R609 Edema, unspecified: Secondary | ICD-10-CM | POA: Diagnosis not present

## 2019-01-29 DIAGNOSIS — E1159 Type 2 diabetes mellitus with other circulatory complications: Secondary | ICD-10-CM | POA: Diagnosis not present

## 2019-01-29 LAB — COMPREHENSIVE METABOLIC PANEL
ALT: 25 U/L (ref 0–53)
AST: 26 U/L (ref 0–37)
Albumin: 4.4 g/dL (ref 3.5–5.2)
Alkaline Phosphatase: 79 U/L (ref 39–117)
BUN: 21 mg/dL (ref 6–23)
CO2: 25 mEq/L (ref 19–32)
Calcium: 8.8 mg/dL (ref 8.4–10.5)
Chloride: 106 mEq/L (ref 96–112)
Creatinine, Ser: 1.39 mg/dL (ref 0.40–1.50)
GFR: 48.78 mL/min — ABNORMAL LOW (ref >60.00)
Glucose, Bld: 121 mg/dL — ABNORMAL HIGH (ref 70–99)
Potassium: 4.1 mEq/L (ref 3.5–5.1)
Sodium: 139 mEq/L (ref 135–145)
Total Bilirubin: 0.9 mg/dL (ref 0.2–1.2)
Total Protein: 7.4 g/dL (ref 6.0–8.3)

## 2019-01-29 LAB — CBC
HCT: 43.9 % (ref 39.0–52.0)
Hemoglobin: 15 g/dL (ref 13.0–17.0)
MCHC: 34.1 g/dL (ref 30.0–36.0)
MCV: 96.1 fl (ref 78.0–100.0)
Platelets: 152 10*3/uL (ref 150.0–400.0)
RBC: 4.57 Mil/uL (ref 4.22–5.81)
RDW: 14.3 % (ref 11.5–15.5)
WBC: 5.9 10*3/uL (ref 4.0–10.5)

## 2019-01-29 LAB — TSH: TSH: 3.28 u[IU]/mL (ref 0.35–4.50)

## 2019-01-29 LAB — BRAIN NATRIURETIC PEPTIDE: Pro B Natriuretic peptide (BNP): 165 pg/mL — ABNORMAL HIGH (ref 0.0–100.0)

## 2019-01-29 MED ORDER — AMLODIPINE BESYLATE 2.5 MG PO TABS: 2.5000 mg | ORAL_TABLET | Freq: Every day | ORAL | 3 refills | Status: DC

## 2019-01-29 NOTE — Telephone Encounter (Signed)
Lillia Carmel (daughter) returned my call. Informed [name] her father can hold his Eliquis 2 days prior to his procedure per [name]. She verbalized understanding.

## 2019-01-29 NOTE — Patient Instructions (Addendum)
Health Maintenance Due  Topic Date Due  . COLONOSCOPY - scheduled 02/10/2019 11/27/2018   Reduce amlodipine to 2.5mg  from 5 mg. I am sending in a new smaller pill for you  If you develop shortness of breath or worsening swelling- please let us know. I am also going to check a heart test called a BNP which can show [place] if there is any obvious heart failure (would get echocardiogram if so)   Lets make sure kidneys, thyroid or liver are not cause of swelling Please stop by lab before you go If you do not have mychart- we will call you about results within 5 business days of [place] receiving them.  If you have mychart- we will send your results within 3 business days of [place] receiving them.  If abnormal or we want to clarify a result, we will call or mychart you to make sure you receive the message.  If you have questions or concerns or don't hear within 5-7 days, please send [place] a message or call us.   Follow up in 1 month or sooner if symptoms worsen
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
## 2019-01-29 NOTE — Telephone Encounter (Signed)
Noted  

## 2019-01-29 NOTE — Telephone Encounter (Signed)
Left a message for patient to return my call. 

## 2019-02-01 ENCOUNTER — Telehealth: Payer: Self-pay | Admitting: Family Medicine

## 2019-02-01 NOTE — Telephone Encounter (Signed)
Pt. Called back and given results with instructions. Verbalizes understanding.

## 2019-02-03 ENCOUNTER — Ambulatory Visit: Payer: Self-pay | Admitting: Family Medicine

## 2019-02-03 NOTE — Telephone Encounter (Signed)
Daughter Lillia Carmel called in for pt's lab results.   She is on the [facility].  I read her the note from [name] dated 01/30/2019 at 3:07 PM.

## 2019-02-09 ENCOUNTER — Telehealth: Payer: Self-pay | Admitting: Gastroenterology

## 2019-02-09 NOTE — Telephone Encounter (Signed)
Covid-19 Screening Questions °  °  °Do you now or have you had a fever in the last 14 days?      °  °Do you have any respiratory symptoms of shortness of breath or cough now or in the last 14 days?     °  °Do you have any family members or close contacts with diagnosed or suspected Covid-19 in the past 14 days?      °  °Have you been tested for Covid-19 and found to be positive?     °  °Please make patient aware that care partner may wait in the car or come up to the lobby during the procedure but will need to provide their own mask. ° °

## 2019-02-10 ENCOUNTER — Encounter: Payer: Self-pay | Admitting: Gastroenterology

## 2019-02-10 ENCOUNTER — Ambulatory Visit (AMBULATORY_SURGERY_CENTER): Payer: PPO | Admitting: Gastroenterology

## 2019-02-10 ENCOUNTER — Other Ambulatory Visit: Payer: Self-pay

## 2019-02-10 VITALS — BP 114/65 | HR 51 | Temp 98.3°F | Resp 18 | Ht 68.0 in | Wt 177.0 lb

## 2019-02-10 DIAGNOSIS — I4891 Unspecified atrial fibrillation: Secondary | ICD-10-CM | POA: Diagnosis not present

## 2019-02-10 DIAGNOSIS — Z85038 Personal history of other malignant neoplasm of large intestine: Secondary | ICD-10-CM

## 2019-02-10 DIAGNOSIS — E119 Type 2 diabetes mellitus without complications: Secondary | ICD-10-CM | POA: Diagnosis not present

## 2019-02-10 DIAGNOSIS — I10 Essential (primary) hypertension: Secondary | ICD-10-CM | POA: Diagnosis not present

## 2019-02-10 MED ORDER — SODIUM CHLORIDE 0.9 % IV SOLN: 500.0000 mL | Freq: Once | INTRAVENOUS | Status: DC

## 2019-02-10 NOTE — Progress Notes (Signed)
CBG 92 before discharge

## 2019-02-10 NOTE — Op Note (Signed)
[facility] Patient Name: Calvin Burnett Procedure Date: 02/10/2019 1:05 PM MRN: [id number] Endoscopist: Ladene Artist , MD Age: 85 Referring MD:  Date of Birth: 1933/09/10 Gender: Male Account #: [id number] Procedure:                Colonoscopy Indications:              High risk colon cancer surveillance: Personal                            history of colon cancer Medicines:                Monitored Anesthesia Care Procedure:                Pre-Anesthesia Assessment:                           - Prior to the procedure, a History and Physical                            was performed, and patient medications and                            allergies were reviewed. The patient's tolerance of                            previous anesthesia was also reviewed. The risks                            and benefits of the procedure and the sedation                            options and risks were discussed with the patient.                            All questions were answered, and informed consent                            was obtained. Prior Anticoagulants: The patient has                            taken Eliquis (apixaban), last dose was 2 days                            prior to procedure. ASA Grade Assessment: III - A                            patient with severe systemic disease. After                            reviewing the risks and benefits, the patient was                            deemed in satisfactory condition to undergo the  procedure.                           After obtaining informed consent, the colonoscope                            was passed under direct vision. Throughout the                            procedure, the patient's blood pressure, pulse, and                            oxygen saturations were monitored continuously. The                            Colonoscope was introduced through the anus and                             advanced to the the cecum, identified by                            appendiceal orifice and ileocecal valve. The                            terminal ileum and the rectum were photographed.                            The quality of the bowel preparation was good. The                            colonoscopy was performed without difficulty. The                            patient tolerated the procedure well. Scope In: 1:10:16 PM Scope Out: 1:17:06 PM Scope Withdrawal Time: 0 hours 6 minutes 0 seconds  Total Procedure Duration: 0 hours 6 minutes 50 seconds  Findings:                 The perianal and digital rectal examinations were                            normal.                           The neo-terminal ileum appeared normal.                           There was evidence of a prior side-to-side                            ileo-colonic anastomosis in the transverse colon.                            This was patent and was characterized by healthy  appearing mucosa. The anastomosis was traversed.                           There was evidence of a prior end-to-end                            colo-colonic anastomosis in the sigmoid colon. This                            was patent and was characterized by healthy                            appearing mucosa. The anastomosis was traversed.                           Multiple medium-mouthed diverticula were found in                            the entire colon.                           Internal hemorrhoids were found during                            retroflexion. The hemorrhoids were medium-sized and                            Grade I (internal hemorrhoids that do not prolapse).                           The exam was otherwise without abnormality on                            direct and retroflexion views. Complications:            No immediate complications. Estimated blood loss:                             None. Estimated Blood Loss:     Estimated blood loss: none. Impression:               - The examined portion of the ileum was normal.                           - Patent side-to-side ileo-colonic anastomosis,                            characterized by healthy appearing mucosa.                           - Patent end-to-end colo-colonic anastomosis,                            characterized by healthy appearing mucosa.                           -  Diverticulosis in the entire examined colon.                           - Internal hemorrhoids.                           - The examination was otherwise normal on direct                            and retroflexion views.                           - No specimens collected. Recommendation:           - Resume Eliquis (apixaban) today at prior dose.                            Refer to managing physician for further adjustment                            of therapy.                           - Patient has a contact number available for                            emergencies. The signs and symptoms of potential                            delayed complications were discussed with the                            patient. Return to normal activities tomorrow.                            Written discharge instructions were provided to the                            patient.                           - High fiber diet.                           - Continue present medications.                           - No repeat colonoscopy due to age and the absence                            of colonic polyps. Ladene Artist, MD 02/10/2019 1:25:19 PM This report has been signed electronically.

## 2019-02-10 NOTE — Patient Instructions (Signed)
Diverticulosis and high fiber diet handouts. Resume Eliquis today at prior dose.     YOU HAD AN ENDOSCOPIC PROCEDURE TODAY AT [facility]:   Refer to the procedure report that was given to you for any specific questions about what was found during the examination.  If the procedure report does not answer your questions, please call your gastroenterologist to clarify.  If you requested that your care partner not be given the details of your procedure findings, then the procedure report has been included in a sealed envelope for you to review at your convenience later.  YOU SHOULD EXPECT: Some feelings of bloating in the abdomen. Passage of more gas than usual.  Walking can help get rid of the air that was put into your GI tract during the procedure and reduce the bloating. If you had a lower endoscopy (such as a colonoscopy or flexible sigmoidoscopy) you may notice spotting of blood in your stool or on the toilet paper. If you underwent a bowel prep for your procedure, you may not have a normal bowel movement for a few days.  Please Note:  You might notice some irritation and congestion in your nose or some drainage.  This is from the oxygen used during your procedure.  There is no need for concern and it should clear up in a day or so.  SYMPTOMS TO REPORT IMMEDIATELY:   Following lower endoscopy (colonoscopy or flexible sigmoidoscopy):  Excessive amounts of blood in the stool  Significant tenderness or worsening of abdominal pains  Swelling of the abdomen that is new, acute  Fever of 100F or higher   For urgent or emergent issues, a gastroenterologist can be reached at any hour by calling [phone].   DIET:  We do recommend a small meal at first, but then you may proceed to your regular diet.  Drink plenty of fluids but you should avoid alcoholic beverages for 24 hours.  ACTIVITY:  You should plan to take it easy for the rest of today and you should NOT DRIVE or use  heavy machinery until tomorrow (because of the sedation medicines used during the test).    FOLLOW UP: Our staff will call the number listed on your records 48-72 hours following your procedure to check on you and address any questions or concerns that you may have regarding the information given to you following your procedure. If we do not reach you, we will leave a message.  We will attempt to reach you two times.  During this call, we will ask if you have developed any symptoms of COVID 19. If you develop any symptoms (ie: fever, flu-like symptoms, shortness of breath, cough etc.) before then, please call [phone].  If you test positive for Covid 19 in the 2 weeks post procedure, please call and report this information to [place].    If any biopsies were taken you will be contacted by phone or by letter within the next 1-3 weeks.  Please call us at [phone] if you have not heard about the biopsies in 3 weeks.    SIGNATURES/CONFIDENTIALITY: You and/or your care partner have signed paperwork which will be entered into your electronic medical record.  These signatures attest to the fact that that the information above on your After Visit Summary has been reviewed and is understood.  Full responsibility of the confidentiality of this discharge information lies with you and/or your care-partner.

## 2019-02-10 NOTE — Progress Notes (Signed)
Pt's states no medical or surgical changes since previsit or office visit.  DM temps and CW vitals. SM

## 2019-02-10 NOTE — Progress Notes (Signed)
CBG did not rise after 234ml OJ so crackers and PB given

## 2019-02-10 NOTE — Progress Notes (Signed)
Report to PACU, RN, vss, BBS= Clear.  

## 2019-02-11 ENCOUNTER — Ambulatory Visit (INDEPENDENT_AMBULATORY_CARE_PROVIDER_SITE_OTHER): Payer: PPO | Admitting: Physician Assistant

## 2019-02-11 ENCOUNTER — Encounter: Payer: Self-pay | Admitting: Physician Assistant

## 2019-02-11 VITALS — BP 118/70 | HR 52 | Temp 98.2°F | Ht 68.0 in | Wt 173.4 lb

## 2019-02-11 DIAGNOSIS — E1159 Type 2 diabetes mellitus with other circulatory complications: Secondary | ICD-10-CM | POA: Diagnosis not present

## 2019-02-11 DIAGNOSIS — I10 Essential (primary) hypertension: Secondary | ICD-10-CM | POA: Diagnosis not present

## 2019-02-11 DIAGNOSIS — R609 Edema, unspecified: Secondary | ICD-10-CM | POA: Diagnosis not present

## 2019-02-11 NOTE — Patient Instructions (Signed)
It was great to see you!  1. Let's have you hold the amlodopine (norvasc) at this time to see if it helps with swelling. 2. Keep your legs elevated while not walking. 3. Avoid extra salt. 4. Consider compression hose.  If you are able to check your blood pressure, please do so and keep record of [place].  Since we are taking you off of your blood pressure medication, I would like to see you in 1-2 weeks. This can be with myself or [name].  If you develop severe shortness of breath, chest pain, or significantly worsening swelling, please tell someone immediately.  Take care,  Inda Coke PA-C
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
## 2019-02-12 ENCOUNTER — Telehealth: Payer: Self-pay

## 2019-02-12 NOTE — Telephone Encounter (Signed)
Left message on answering machine. 

## 2019-02-12 NOTE — Telephone Encounter (Signed)
Attempted to reach patient for post-procedure f/u call. No answer. Left message for him to please not hesitate to call us if he has any questions/concerns regarding his care or if he develops SXS of COVID19 in the near future.

## 2019-02-23 ENCOUNTER — Telehealth: Payer: Self-pay

## 2019-02-23 NOTE — Telephone Encounter (Signed)
LM2CB  Pt is overdue for f/u appt. There are vir appt available this week.

## 2019-02-25 ENCOUNTER — Encounter: Payer: Self-pay | Admitting: Family Medicine

## 2019-02-25 ENCOUNTER — Ambulatory Visit (INDEPENDENT_AMBULATORY_CARE_PROVIDER_SITE_OTHER): Payer: PPO | Admitting: Family Medicine

## 2019-02-25 ENCOUNTER — Other Ambulatory Visit: Payer: Self-pay

## 2019-02-25 VITALS — BP 150/84 | HR 45 | Temp 97.7°F | Ht 68.0 in | Wt 176.2 lb

## 2019-02-25 DIAGNOSIS — I10 Essential (primary) hypertension: Secondary | ICD-10-CM

## 2019-02-25 DIAGNOSIS — I480 Paroxysmal atrial fibrillation: Secondary | ICD-10-CM | POA: Diagnosis not present

## 2019-02-25 DIAGNOSIS — R609 Edema, unspecified: Secondary | ICD-10-CM

## 2019-02-25 DIAGNOSIS — N183 Chronic kidney disease, stage 3 unspecified: Secondary | ICD-10-CM

## 2019-02-25 DIAGNOSIS — E1159 Type 2 diabetes mellitus with other circulatory complications: Secondary | ICD-10-CM | POA: Diagnosis not present

## 2019-02-25 MED ORDER — HYDROCHLOROTHIAZIDE 12.5 MG PO TABS: 12.5000 mg | ORAL_TABLET | Freq: Every day | ORAL | 5 refills | Status: DC

## 2019-02-25 NOTE — Patient Instructions (Addendum)
Health Maintenance Due  Topic Date Due  . INFLUENZA VACCINE  02/13/2019  We should have flu shots available by September. Please strongly consider getting flu shot this year. If you get your flu shot at a pharmacy- please let us know.   Continue metoprolol. Add hydrochlorothiazide 12.5mg  daily each morning- see [place] back in 1 month. If you have worsening swelling or develop shortness of breath see [place] immediately  Try compression stockings- can buy from [facility] or medical supply store- want 20-30 mmhg and up to the knee.

## 2019-02-25 NOTE — Progress Notes (Signed)
Phone [phone]   Subjective:  Calvin Burnett is a 85 y.o. year old very pleasant male patient who presents for/with See problem oriented charting Chief Complaint  Patient presents with  . Follow-up  . Hypertension  . Edema   ROS- Denies HA, dizziness, CP, SOB, visual changes.   Past Medical History-  Patient Active Problem List   Diagnosis Date Noted  . B12 deficiency 12/04/2017    Priority: High  . pT1pN0 colon cancer s/p robotic right colectomy 11/26/2017 10/02/2017    Priority: High  . Paroxysmal atrial fibrillation ([place]) 04/09/2017    Priority: High  . Night sweats 09/16/2016    Priority: High  . Major depressive disorder with single episode, in full remission ([place]) 07/29/2016    Priority: High  . Rectal pain 10/14/2014    Priority: High  . Prostate cancer ([place]) 08/12/2012    Priority: High  . Controlled type 2 diabetes with renal manifestation ([place]) 07/31/2010    Priority: High  . Vascular dementia ([place]) 07/27/2008    Priority: High  . History of adenomatous polyp of colon 10/27/2014    Priority: Medium  . Syncope 09/27/2014    Priority: Medium  . CKD (chronic kidney disease), stage III ([place]) 06/01/2014    Priority: Medium  . Hyperlipidemia associated with type 2 diabetes mellitus ([place]) 03/01/2014    Priority: Medium  . Basal cell carcinoma 05/03/2009    Priority: Medium  . Hypertension associated with diabetes ([place]) 01/30/2007    Priority: Medium  . Stricture of sigmoid s/p robotic sigmoidectomy 11/26/2017 11/26/2017    Priority: Low  . Chronic anticoagulation 11/26/2017    Priority: Low  . Right inguinal hernia 10/02/2017    Priority: Low  . Bowel habit changes 10/14/2014    Priority: Low  . Bradycardia 09/27/2014    Priority: Low  . LEG CRAMPS, NOCTURNAL 04/30/2010    Priority: Low  . Cervical spondylosis without myelopathy 12/11/2007    Priority: Low  . GERD 11/05/2007    Priority: Low  . Aortic atherosclerosis ([place]) 01/29/2019  .  Pneumoperitoneum of unknown etiology 06/03/2018   Medications- reviewed and updated Current Outpatient Medications  Medication Sig Dispense Refill  . acetaminophen (TYLENOL) 500 MG tablet Take 500 mg by mouth daily as needed for moderate pain or headache.    Marland Kitchen apixaban (ELIQUIS) 5 MG TABS tablet Take 1 tablet (5 mg total) by mouth 2 (two) times daily. 60 tablet 11  . latanoprost (XALATAN) 0.005 % ophthalmic solution PLACE 1 DROP INTO BOTH EYES NIGHTLY.  12  . MELATONIN PO Take 1 tablet by mouth at bedtime.     . memantine (NAMENDA XR) 28 MG CP24 24 hr capsule TAKE 1 CAPSULE (28 MG TOTAL) BY MOUTH DAILY. 90 capsule 3  . metFORMIN (GLUCOPHAGE-XR) 500 MG 24 hr tablet TAKE 1 TABLET BY MOUTH EVERY DAY WITH BREAKFAST 30 tablet 5  . metoprolol succinate (TOPROL-XL) 25 MG 24 hr tablet TAKE 1/2 TABLET BY MOUTH EVERY DAY 45 tablet 1  . hydrochlorothiazide (HYDRODIURIL) 12.5 MG tablet Take 1 tablet (12.5 mg total) by mouth daily. 30 tablet 5   No current facility-administered medications for this visit.      Objective:  BP (!) 150/84 (BP Location: Left Arm, Patient Position: Sitting, Cuff Size: Normal)   Pulse (!) 45   Temp 97.7 F (36.5 C) (Oral)   Ht 5\' 8"  (1.727 m)   Wt 176 lb 3.2 oz (79.9 kg)   SpO2 98%   BMI 26.79 kg/m  Gen: NAD, resting comfortably CV: irregularly irregular, no murmurs rubs or gallops. HR in low 50s on my exam Lungs: CTAB no crackles, wheeze, rhonchi Abdomen: soft/nontender/nondistended/normal bowel sounds.  Ext: 1+ edema Skin: warm, dry     Assessment and Plan   #hypertension/CKD III/edema S: BP poorly controlled on Metoprolol XL 25 mg 1/2 tablet daily. Not checking BP at home.  Not cooking with salt, adding salt, avoiding fast food.   Last visit, Advised to hold Amlodipine 2.5 mg x 1-2 weeks to see if swelling improved.  Pt reports mild improvement of swelling with stopping Amlodipine but still has swelling present. He is not currently taking Amlodipine.   CKD  III largely stable on last check.  BP Readings from Last 3 Encounters:  02/25/19 (!) 150/84  02/11/19 118/70  02/10/19 114/65  A/P: BP poorly controlled and edema persists- considered lasix 20mg  vs. hctz 12.5 mg- last GFR 48-49- think can tolerate hctz- will trial this. Hoping this helps edema as well. Pro BNP was 165/ not elevated last visit- considering particularly age and CKD this is well in normal range. Stay off amlodipine   - looked back and has had trace or minimal edema at times in past- we discussed trying compression stockings from [facility] with 20-30 mmhg- or can try ETI in [place]  - will monitor GFR at follow up with BMP given starting hctz- hopefully stable.   # atrial fibrillation- appears to be in afib. Initial HR in 40s- patient states not bothering him/no lightheadedness. Repeat shows low 50s HR- will maintain metoprolol both for BP and rate control. If becomes asymptomatic could consider stopping   Recommended follow up: 1 month BP check  Future Appointments  Date Time Provider [place]  04/01/2019  1:00 PM Marin Olp, MD [facility]  05/06/2019 10:00 AM Marin Olp, MD [facility]   Lab/Order associations:   ICD-10-CM   1. Hypertension associated with diabetes ([place])  E11.59    I10   2. CKD (chronic kidney disease), stage III ([name])  N18.3   3. Edema, unspecified type  R60.9   4. Paroxysmal atrial fibrillation ([name])  I48.0     Meds ordered this encounter  Medications  . hydrochlorothiazide (HYDRODIURIL) 12.5 MG tablet    Sig: Take 1 tablet (12.5 mg total) by mouth daily.    Dispense:  30 tablet    Refill:  5    Return precautions advised.  [name], MD

## 2019-03-03 ENCOUNTER — Ambulatory Visit: Payer: PPO | Admitting: Family Medicine

## 2019-03-11 ENCOUNTER — Other Ambulatory Visit: Payer: Self-pay

## 2019-03-11 ENCOUNTER — Ambulatory Visit (INDEPENDENT_AMBULATORY_CARE_PROVIDER_SITE_OTHER): Payer: PPO

## 2019-03-11 DIAGNOSIS — Z0000 Encounter for general adult medical examination without abnormal findings: Secondary | ICD-10-CM | POA: Diagnosis not present

## 2019-03-11 NOTE — Progress Notes (Signed)
I have reviewed and agree with note, evaluation, plan.  I love the idea of home monitoring for patient-thanks for assisting  [name], MD

## 2019-03-11 NOTE — Patient Instructions (Signed)
Calvin Burnett , Thank you for taking time to come for your Medicare Wellness Visit. I appreciate your ongoing commitment to your health goals. Please review the following plan we discussed and let me know if I can assist you in the future.   Screening recommendations/referrals: Colorectal Screening: completed last 02/10/19 with Dr. Fuller Plan   Vision and Dental Exams: Recommended annual ophthalmology exams for early detection of glaucoma and other disorders of the eye Recommended annual dental exams for proper oral hygiene  Diabetic Exams: Diabetic Eye Exam: up to date  Diabetic Foot Exam: up to date   Vaccinations: Influenza vaccine:  recommended this fall either at PCP office or through your local pharmacy  Pneumococcal vaccine: completed 05/30/15 Tdap vaccine: up to date; last 01/28/19 Shingles vaccine: completed  Advanced directives: Please bring a copy of your POA (Power of [place]) and/or Living Will to your next appointment.  Goals: Recommend to drink at least 6-8 8oz glasses of water per day and monitor your salt intake   Next appointment: Please schedule your Annual Wellness Visit with your Nurse Health Advisor in one year.  Preventive Care 11 Years and Older, Male Preventive care refers to lifestyle choices and visits with your health care provider that can promote health and wellness. What does preventive care include?  A yearly physical exam. This is also called an annual well check.  Dental exams once or twice a year.  Routine eye exams. Ask your health care provider how often you should have your eyes checked.  Personal lifestyle choices, including:  Daily care of your teeth and gums.  Regular physical activity.  Eating a healthy diet.  Avoiding tobacco and drug use.  Limiting alcohol use.  Practicing safe sex.  Taking low doses of aspirin every day if recommended by your health care provider..  Taking vitamin and mineral supplements as recommended by your  health care provider. What happens during an annual well check? The services and screenings done by your health care provider during your annual well check will depend on your age, overall health, lifestyle risk factors, and family history of disease. Counseling  Your health care provider may ask you questions about your:  Alcohol use.  Tobacco use.  Drug use.  Emotional well-being.  Home and relationship well-being.  Sexual activity.  Eating habits.  History of falls.  Memory and ability to understand (cognition).  Work and work Statistician. Screening  You may have the following tests or measurements:  Height, weight, and BMI.  Blood pressure.  Lipid and cholesterol levels. These may be checked every 5 years, or more frequently if you are over 37 years old.  Skin check.  Lung cancer screening. You may have this screening every year starting at age 22 if you have a 30-pack-year history of smoking and currently smoke or have quit within the past 15 years.  Fecal occult blood test (FOBT) of the stool. You may have this test every year starting at age 1.  Flexible sigmoidoscopy or colonoscopy. You may have a sigmoidoscopy every 5 years or a colonoscopy every 10 years starting at age 9.  Prostate cancer screening. Recommendations will vary depending on your family history and other risks.  Hepatitis C blood test.  Hepatitis B blood test.  Sexually transmitted disease (STD) testing.  Diabetes screening. This is done by checking your blood sugar (glucose) after you have not eaten for a while (fasting). You may have this done every 1-3 years.  Abdominal aortic aneurysm (AAA) screening. You  may need this if you are a current or former smoker.  Osteoporosis. You may be screened starting at age 70 if you are at high risk. Talk with your health care provider about your test results, treatment options, and if necessary, the need for more tests. Vaccines  Your health  care provider may recommend certain vaccines, such as:  Influenza vaccine. This is recommended every year.  Tetanus, diphtheria, and acellular pertussis (Tdap, Td) vaccine. You may need a Td booster every 10 years.  Zoster vaccine. You may need this after age 79.  Pneumococcal 13-valent conjugate (PCV13) vaccine. One dose is recommended after age 35.  Pneumococcal polysaccharide (PPSV23) vaccine. One dose is recommended after age 64. Talk to your health care provider about which screenings and vaccines you need and how often you need them. This information is not intended to replace advice given to you by your health care provider. Make sure you discuss any questions you have with your health care provider. Document Released: 07/28/2015 Document Revised: 03/20/2016 Document Reviewed: 05/02/2015 Elsevier Interactive Patient Education  2017 [employer] Prevention in the Home Falls can cause injuries. They can happen to people of all ages. There are many things you can do to make your home safe and to help prevent falls. What can I do on the outside of my home?  Regularly fix the edges of walkways and driveways and fix any cracks.  Remove anything that might make you trip as you walk through a door, such as a raised step or threshold.  Trim any bushes or trees on the path to your home.  Use bright outdoor lighting.  Clear any walking paths of anything that might make someone trip, such as rocks or tools.  Regularly check to see if handrails are loose or broken. Make sure that both sides of any steps have handrails.  Any raised decks and porches should have guardrails on the edges.  Have any leaves, snow, or ice cleared regularly.  Use sand or salt on walking paths during winter.  Clean up any spills in your garage right away. This includes oil or grease spills. What can I do in the bathroom?  Use night lights.  Install grab bars by the toilet and in the tub and  shower. Do not use towel bars as grab bars.  Use non-skid mats or decals in the tub or shower.  If you need to sit down in the shower, use a plastic, non-slip stool.  Keep the floor dry. Clean up any water that spills on the floor as soon as it happens.  Remove soap buildup in the tub or shower regularly.  Attach bath mats securely with double-sided non-slip rug tape.  Do not have throw rugs and other things on the floor that can make you trip. What can I do in the bedroom?  Use night lights.  Make sure that you have a light by your bed that is easy to reach.  Do not use any sheets or blankets that are too big for your bed. They should not hang down onto the floor.  Have a firm chair that has side arms. You can use this for support while you get dressed.  Do not have throw rugs and other things on the floor that can make you trip. What can I do in the kitchen?  Clean up any spills right away.  Avoid walking on wet floors.  Keep items that you use a lot in easy-to-reach places.  If you need to reach something above you, use a strong step stool that has a grab bar.  Keep electrical cords out of the way.  Do not use floor polish or wax that makes floors slippery. If you must use wax, use non-skid floor wax.  Do not have throw rugs and other things on the floor that can make you trip. What can I do with my stairs?  Do not leave any items on the stairs.  Make sure that there are handrails on both sides of the stairs and use them. Fix handrails that are broken or loose. Make sure that handrails are as long as the stairways.  Check any carpeting to make sure that it is firmly attached to the stairs. Fix any carpet that is loose or worn.  Avoid having throw rugs at the top or bottom of the stairs. If you do have throw rugs, attach them to the floor with carpet tape.  Make sure that you have a light switch at the top of the stairs and the bottom of the stairs. If you do not  have them, ask someone to add them for you. What else can I do to help prevent falls?  Wear shoes that:  Do not have high heels.  Have rubber bottoms.  Are comfortable and fit you well.  Are closed at the toe. Do not wear sandals.  If you use a stepladder:  Make sure that it is fully opened. Do not climb a closed stepladder.  Make sure that both sides of the stepladder are locked into place.  Ask someone to hold it for you, if possible.  Clearly mark and make sure that you can see:  Any grab bars or handrails.  First and last steps.  Where the edge of each step is.  Use tools that help you move around (mobility aids) if they are needed. These include:  Canes.  Walkers.  Scooters.  Crutches.  Turn on the lights when you go into a dark area. Replace any light bulbs as soon as they burn out.  Set up your furniture so you have a clear path. Avoid moving your furniture around.  If any of your floors are uneven, fix them.  If there are any pets around you, be aware of where they are.  Review your medicines with your doctor. Some medicines can make you feel dizzy. This can increase your chance of falling. Ask your doctor what other things that you can do to help prevent falls. This information is not intended to replace advice given to you by your health care provider. Make sure you discuss any questions you have with your health care provider. Document Released: 04/27/2009 Document Revised: 12/07/2015 Document Reviewed: 08/05/2014 Elsevier Interactive Patient Education  2017 [employer].
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
## 2019-03-23 ENCOUNTER — Ambulatory Visit: Payer: Self-pay

## 2019-04-01 ENCOUNTER — Ambulatory Visit: Payer: PPO | Admitting: Family Medicine

## 2019-04-14 ENCOUNTER — Emergency Department (HOSPITAL_COMMUNITY): Payer: PPO

## 2019-04-14 ENCOUNTER — Inpatient Hospital Stay (HOSPITAL_COMMUNITY)
Admission: EM | Admit: 2019-04-14 | Discharge: 2019-04-20 | DRG: 395 | Disposition: A | Discharge status: Home / Self Care | Payer: PPO | Attending: Internal Medicine | Admitting: Internal Medicine

## 2019-04-14 ENCOUNTER — Encounter (HOSPITAL_COMMUNITY): Payer: Self-pay

## 2019-04-14 ENCOUNTER — Other Ambulatory Visit: Payer: Self-pay

## 2019-04-14 ENCOUNTER — Inpatient Hospital Stay (HOSPITAL_COMMUNITY): Payer: PPO

## 2019-04-14 DIAGNOSIS — I129 Hypertensive chronic kidney disease with stage 1 through stage 4 chronic kidney disease, or unspecified chronic kidney disease: Secondary | ICD-10-CM | POA: Diagnosis not present

## 2019-04-14 DIAGNOSIS — Z888 Allergy status to other drugs, medicaments and biological substances status: Secondary | ICD-10-CM | POA: Diagnosis not present

## 2019-04-14 DIAGNOSIS — Z825 Family history of asthma and other chronic lower respiratory diseases: Secondary | ICD-10-CM | POA: Diagnosis not present

## 2019-04-14 DIAGNOSIS — Z79899 Other long term (current) drug therapy: Secondary | ICD-10-CM

## 2019-04-14 DIAGNOSIS — Z801 Family history of malignant neoplasm of trachea, bronchus and lung: Secondary | ICD-10-CM | POA: Diagnosis not present

## 2019-04-14 DIAGNOSIS — Z8546 Personal history of malignant neoplasm of prostate: Secondary | ICD-10-CM | POA: Diagnosis not present

## 2019-04-14 DIAGNOSIS — E1122 Type 2 diabetes mellitus with diabetic chronic kidney disease: Secondary | ICD-10-CM | POA: Diagnosis present

## 2019-04-14 DIAGNOSIS — Z9842 Cataract extraction status, left eye: Secondary | ICD-10-CM

## 2019-04-14 DIAGNOSIS — F0390 Unspecified dementia without behavioral disturbance: Secondary | ICD-10-CM

## 2019-04-14 DIAGNOSIS — Z9049 Acquired absence of other specified parts of digestive tract: Secondary | ICD-10-CM

## 2019-04-14 DIAGNOSIS — K219 Gastro-esophageal reflux disease without esophagitis: Secondary | ICD-10-CM | POA: Diagnosis not present

## 2019-04-14 DIAGNOSIS — I10 Essential (primary) hypertension: Secondary | ICD-10-CM

## 2019-04-14 DIAGNOSIS — F0280 Dementia in other diseases classified elsewhere without behavioral disturbance: Secondary | ICD-10-CM | POA: Diagnosis not present

## 2019-04-14 DIAGNOSIS — R198 Other specified symptoms and signs involving the digestive system and abdomen: Secondary | ICD-10-CM

## 2019-04-14 DIAGNOSIS — Z7901 Long term (current) use of anticoagulants: Secondary | ICD-10-CM

## 2019-04-14 DIAGNOSIS — Z85828 Personal history of other malignant neoplasm of skin: Secondary | ICD-10-CM | POA: Diagnosis not present

## 2019-04-14 DIAGNOSIS — I700 Atherosclerosis of aorta: Secondary | ICD-10-CM | POA: Diagnosis present

## 2019-04-14 DIAGNOSIS — E785 Hyperlipidemia, unspecified: Secondary | ICD-10-CM | POA: Diagnosis not present

## 2019-04-14 DIAGNOSIS — Z23 Encounter for immunization: Secondary | ICD-10-CM

## 2019-04-14 DIAGNOSIS — Z03818 Encounter for observation for suspected exposure to other biological agents ruled out: Secondary | ICD-10-CM | POA: Diagnosis not present

## 2019-04-14 DIAGNOSIS — Z79890 Hormone replacement therapy (postmenopausal): Secondary | ICD-10-CM

## 2019-04-14 DIAGNOSIS — Z9841 Cataract extraction status, right eye: Secondary | ICD-10-CM | POA: Diagnosis not present

## 2019-04-14 DIAGNOSIS — Z7984 Long term (current) use of oral hypoglycemic drugs: Secondary | ICD-10-CM

## 2019-04-14 DIAGNOSIS — Z833 Family history of diabetes mellitus: Secondary | ICD-10-CM

## 2019-04-14 DIAGNOSIS — Z87891 Personal history of nicotine dependence: Secondary | ICD-10-CM

## 2019-04-14 DIAGNOSIS — G309 Alzheimer's disease, unspecified: Secondary | ICD-10-CM | POA: Diagnosis present

## 2019-04-14 DIAGNOSIS — Z85038 Personal history of other malignant neoplasm of large intestine: Secondary | ICD-10-CM | POA: Diagnosis not present

## 2019-04-14 DIAGNOSIS — K631 Perforation of intestine (nontraumatic): Secondary | ICD-10-CM | POA: Diagnosis not present

## 2019-04-14 DIAGNOSIS — Z20828 Contact with and (suspected) exposure to other viral communicable diseases: Secondary | ICD-10-CM | POA: Diagnosis not present

## 2019-04-14 DIAGNOSIS — R109 Unspecified abdominal pain: Secondary | ICD-10-CM | POA: Diagnosis not present

## 2019-04-14 DIAGNOSIS — N183 Chronic kidney disease, stage 3 unspecified: Secondary | ICD-10-CM | POA: Diagnosis present

## 2019-04-14 DIAGNOSIS — Z961 Presence of intraocular lens: Secondary | ICD-10-CM | POA: Diagnosis not present

## 2019-04-14 DIAGNOSIS — K668 Other specified disorders of peritoneum: Secondary | ICD-10-CM | POA: Diagnosis not present

## 2019-04-14 DIAGNOSIS — I4891 Unspecified atrial fibrillation: Secondary | ICD-10-CM

## 2019-04-14 DIAGNOSIS — I480 Paroxysmal atrial fibrillation: Secondary | ICD-10-CM | POA: Diagnosis not present

## 2019-04-14 DIAGNOSIS — E119 Type 2 diabetes mellitus without complications: Secondary | ICD-10-CM

## 2019-04-14 LAB — URINALYSIS, ROUTINE W REFLEX MICROSCOPIC
Bacteria, UA: NONE SEEN
Bilirubin Urine: NEGATIVE
Glucose, UA: NEGATIVE mg/dL
Ketones, ur: NEGATIVE mg/dL
Leukocytes,Ua: NEGATIVE
Nitrite: NEGATIVE
Protein, ur: 100 mg/dL — AB
Specific Gravity, Urine: >1.046 — ABNORMAL HIGH (ref 1.005–1.030)
pH: 5 (ref 5.0–8.0)

## 2019-04-14 LAB — CBC WITH DIFFERENTIAL/PLATELET
Abs Immature Granulocytes: 0.09 10*3/uL — ABNORMAL HIGH (ref 0.00–0.07)
Basophils Absolute: 0 10*3/uL (ref 0.0–0.1)
Basophils Relative: 0 %
Eosinophils Absolute: 0 10*3/uL (ref 0.0–0.5)
Eosinophils Relative: 0 %
HCT: 49.9 % (ref 39.0–52.0)
Hemoglobin: 17.5 g/dL — ABNORMAL HIGH (ref 13.0–17.0)
Immature Granulocytes: 1 %
Lymphocytes Relative: 15 %
Lymphs Abs: 1.7 10*3/uL (ref 0.7–4.0)
MCH: 33.3 pg (ref 26.0–34.0)
MCHC: 35.1 g/dL (ref 30.0–36.0)
MCV: 94.9 fL (ref 80.0–100.0)
Monocytes Absolute: 0.5 10*3/uL (ref 0.1–1.0)
Monocytes Relative: 4 %
Neutro Abs: 9 10*3/uL — ABNORMAL HIGH (ref 1.7–7.7)
Neutrophils Relative %: 80 %
Platelets: 128 10*3/uL — ABNORMAL LOW (ref 150–400)
RBC: 5.26 MIL/uL (ref 4.22–5.81)
RDW: 13.1 % (ref 11.5–15.5)
WBC: 11.2 10*3/uL — ABNORMAL HIGH (ref 4.0–10.5)
nRBC: 0 % (ref 0.0–0.2)

## 2019-04-14 LAB — COMPREHENSIVE METABOLIC PANEL
ALT: 30 U/L (ref 0–44)
AST: 32 U/L (ref 15–41)
Albumin: 3.9 g/dL (ref 3.5–5.0)
Alkaline Phosphatase: 82 U/L (ref 38–126)
Anion gap: 14 (ref 5–15)
BUN: 23 mg/dL (ref 8–23)
CO2: 24 mmol/L (ref 22–32)
Calcium: 9.1 mg/dL (ref 8.9–10.3)
Chloride: 99 mmol/L (ref 98–111)
Creatinine, Ser: 1.54 mg/dL — ABNORMAL HIGH (ref 0.61–1.24)
GFR calc Af Amer: 48 mL/min — ABNORMAL LOW (ref >60)
GFR calc non Af Amer: 41 mL/min — ABNORMAL LOW (ref >60)
Glucose, Bld: 188 mg/dL — ABNORMAL HIGH (ref 70–99)
Potassium: 3.9 mmol/L (ref 3.5–5.1)
Sodium: 137 mmol/L (ref 135–145)
Total Bilirubin: 2.1 mg/dL — ABNORMAL HIGH (ref 0.3–1.2)
Total Protein: 7.8 g/dL (ref 6.5–8.1)

## 2019-04-14 LAB — LIPASE, BLOOD: Lipase: 22 U/L (ref 11–51)

## 2019-04-14 LAB — LACTIC ACID, PLASMA
Lactic Acid, Venous: 3.6 mmol/L (ref 0.5–1.9)
Lactic Acid, Venous: 2.1 mmol/L (ref 0.5–1.9)

## 2019-04-14 LAB — PROTIME-INR
INR: 1.4 — ABNORMAL HIGH (ref 0.8–1.2)
Prothrombin Time: 16.7 seconds — ABNORMAL HIGH (ref 11.4–15.2)

## 2019-04-14 LAB — HEMOGLOBIN A1C
Hgb A1c MFr Bld: 6.7 % — ABNORMAL HIGH (ref 4.8–5.6)
Mean Plasma Glucose: 145.59 mg/dL

## 2019-04-14 LAB — GLUCOSE, CAPILLARY
Glucose-Capillary: 167 mg/dL — ABNORMAL HIGH (ref 70–99)
Glucose-Capillary: 119 mg/dL — ABNORMAL HIGH (ref 70–99)

## 2019-04-14 LAB — SARS CORONAVIRUS 2 BY RT PCR (HOSPITAL ORDER, PERFORMED IN ~~LOC~~ HOSPITAL LAB): SARS Coronavirus 2: NEGATIVE

## 2019-04-14 MED ORDER — MORPHINE SULFATE (PF) 4 MG/ML IV SOLN
4.0000 mg | Freq: Once | INTRAVENOUS | Status: AC
  Administered 2019-04-14: 4 mg via INTRAVENOUS
  Filled 2019-04-14: qty 1

## 2019-04-14 MED ORDER — HYDRALAZINE HCL 20 MG/ML IJ SOLN: 10.0000 mg | Freq: Four times a day (QID) | INTRAMUSCULAR | Status: DC | PRN

## 2019-04-14 MED ORDER — PIPERACILLIN-TAZOBACTAM 3.375 G IVPB 30 MIN
3.3750 g | Freq: Once | INTRAVENOUS | Status: AC
  Administered 2019-04-14: 3.375 g via INTRAVENOUS
  Filled 2019-04-14: qty 50

## 2019-04-14 MED ORDER — SODIUM CHLORIDE 0.9 % IV SOLN
INTRAVENOUS | Status: DC
  Administered 2019-04-14 – 2019-04-18 (×7): via INTRAVENOUS

## 2019-04-14 MED ORDER — PIPERACILLIN-TAZOBACTAM 3.375 G IVPB
3.3750 g | Freq: Three times a day (TID) | INTRAVENOUS | Status: DC
  Administered 2019-04-14 – 2019-04-20 (×18): 3.375 g via INTRAVENOUS
  Filled 2019-04-14 (×16): qty 50

## 2019-04-14 MED ORDER — SODIUM CHLORIDE 0.9 % IV BOLUS
1000.0000 mL | Freq: Once | INTRAVENOUS | Status: AC
  Administered 2019-04-14: 12:00:00 1000 mL via INTRAVENOUS

## 2019-04-14 MED ORDER — ONDANSETRON HCL 4 MG/2ML IJ SOLN: 4.0000 mg | Freq: Four times a day (QID) | INTRAMUSCULAR | Status: DC | PRN

## 2019-04-14 MED ORDER — SODIUM CHLORIDE 0.9 % IV SOLN
INTRAVENOUS | Status: DC | PRN
  Administered 2019-04-14 – 2019-04-18 (×2): 250 mL via INTRAVENOUS

## 2019-04-14 MED ORDER — INSULIN ASPART 100 UNIT/ML ~~LOC~~ SOLN: 0.0000 [IU] | Freq: Every day | SUBCUTANEOUS | Status: DC

## 2019-04-14 MED ORDER — PIPERACILLIN-TAZOBACTAM 3.375 G IVPB 30 MIN: 3.3750 g | Freq: Four times a day (QID) | INTRAVENOUS | Status: DC

## 2019-04-14 MED ORDER — ONDANSETRON HCL 4 MG PO TABS: 4.0000 mg | ORAL_TABLET | Freq: Four times a day (QID) | ORAL | Status: DC | PRN

## 2019-04-14 MED ORDER — ONDANSETRON HCL 4 MG/2ML IJ SOLN
4.0000 mg | Freq: Once | INTRAMUSCULAR | Status: AC
  Administered 2019-04-14: 4 mg via INTRAVENOUS
  Filled 2019-04-14: qty 2

## 2019-04-14 MED ORDER — INSULIN ASPART 100 UNIT/ML ~~LOC~~ SOLN
0.0000 [IU] | Freq: Three times a day (TID) | SUBCUTANEOUS | Status: DC
  Administered 2019-04-14: 3 [IU] via SUBCUTANEOUS
  Administered 2019-04-15: 13:00:00 2 [IU] via SUBCUTANEOUS
  Administered 2019-04-15: 3 [IU] via SUBCUTANEOUS
  Administered 2019-04-15: 18:00:00 2 [IU] via SUBCUTANEOUS
  Administered 2019-04-16 (×3): 3 [IU] via SUBCUTANEOUS
  Administered 2019-04-17: 5 [IU] via SUBCUTANEOUS
  Administered 2019-04-17: 09:00:00 2 [IU] via SUBCUTANEOUS
  Administered 2019-04-17 – 2019-04-18 (×4): 3 [IU] via SUBCUTANEOUS
  Administered 2019-04-19: 2 [IU] via SUBCUTANEOUS
  Administered 2019-04-19: 5 [IU] via SUBCUTANEOUS
  Administered 2019-04-20 (×2): 2 [IU] via SUBCUTANEOUS

## 2019-04-14 MED ORDER — MORPHINE SULFATE (PF) 4 MG/ML IV SOLN
4.0000 mg | INTRAVENOUS | Status: DC | PRN
  Administered 2019-04-15: 07:00:00 4 mg via INTRAVENOUS
  Filled 2019-04-14: qty 1

## 2019-04-14 MED ORDER — IOHEXOL 300 MG/ML  SOLN
80.0000 mL | Freq: Once | INTRAMUSCULAR | Status: AC | PRN
  Administered 2019-04-14: 80 mL via INTRAVENOUS

## 2019-04-14 NOTE — ED Notes (Signed)
Pt reminded of the need for urine.  

## 2019-04-14 NOTE — Plan of Care (Signed)
  Problem: Pain Managment: Goal: General experience of comfort will improve Outcome: Progressing   Problem: Safety: Goal: Ability to remain free from injury will improve Outcome: Progressing   Problem: Skin Integrity: Goal: Risk for impaired skin integrity will decrease Outcome: Progressing   

## 2019-04-14 NOTE — ED Notes (Signed)
Called lab to add on [facility]

## 2019-04-14 NOTE — ED Triage Notes (Addendum)
Pt reports abdominal pain x 5days. Denies NVD. Pt is on eliquis
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
## 2019-04-14 NOTE — ED Notes (Signed)
Report attempted 

## 2019-04-14 NOTE — ED Notes (Signed)
Patient transported to CT 
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
## 2019-04-15 ENCOUNTER — Ambulatory Visit: Payer: PPO | Admitting: Family Medicine

## 2019-04-15 DIAGNOSIS — I10 Essential (primary) hypertension: Secondary | ICD-10-CM

## 2019-04-15 DIAGNOSIS — F0390 Unspecified dementia without behavioral disturbance: Secondary | ICD-10-CM

## 2019-04-15 DIAGNOSIS — K219 Gastro-esophageal reflux disease without esophagitis: Secondary | ICD-10-CM

## 2019-04-15 DIAGNOSIS — E785 Hyperlipidemia, unspecified: Secondary | ICD-10-CM

## 2019-04-15 DIAGNOSIS — E1122 Type 2 diabetes mellitus with diabetic chronic kidney disease: Secondary | ICD-10-CM

## 2019-04-15 DIAGNOSIS — I480 Paroxysmal atrial fibrillation: Secondary | ICD-10-CM

## 2019-04-15 LAB — GLUCOSE, CAPILLARY
Glucose-Capillary: 122 mg/dL — ABNORMAL HIGH (ref 70–99)
Glucose-Capillary: 125 mg/dL — ABNORMAL HIGH (ref 70–99)
Glucose-Capillary: 136 mg/dL — ABNORMAL HIGH (ref 70–99)
Glucose-Capillary: 148 mg/dL — ABNORMAL HIGH (ref 70–99)
Glucose-Capillary: 149 mg/dL — ABNORMAL HIGH (ref 70–99)
Glucose-Capillary: 166 mg/dL — ABNORMAL HIGH (ref 70–99)
Glucose-Capillary: 171 mg/dL — ABNORMAL HIGH (ref 70–99)

## 2019-04-15 LAB — BASIC METABOLIC PANEL
Anion gap: 12 (ref 5–15)
BUN: 22 mg/dL (ref 8–23)
CO2: 21 mmol/L — ABNORMAL LOW (ref 22–32)
Calcium: 7.7 mg/dL — ABNORMAL LOW (ref 8.9–10.3)
Chloride: 104 mmol/L (ref 98–111)
Creatinine, Ser: 1.82 mg/dL — ABNORMAL HIGH (ref 0.61–1.24)
GFR calc Af Amer: 39 mL/min — ABNORMAL LOW (ref >60)
GFR calc non Af Amer: 34 mL/min — ABNORMAL LOW (ref >60)
Glucose, Bld: 149 mg/dL — ABNORMAL HIGH (ref 70–99)
Potassium: 3.4 mmol/L — ABNORMAL LOW (ref 3.5–5.1)
Sodium: 137 mmol/L (ref 135–145)

## 2019-04-15 LAB — CBC
HCT: 43.3 % (ref 39.0–52.0)
Hemoglobin: 15.5 g/dL (ref 13.0–17.0)
MCH: 33.3 pg (ref 26.0–34.0)
MCHC: 35.8 g/dL (ref 30.0–36.0)
MCV: 92.9 fL (ref 80.0–100.0)
Platelets: 107 10*3/uL — ABNORMAL LOW (ref 150–400)
RBC: 4.66 MIL/uL (ref 4.22–5.81)
RDW: 13.2 % (ref 11.5–15.5)
WBC: 10.3 10*3/uL (ref 4.0–10.5)
nRBC: 0 % (ref 0.0–0.2)

## 2019-04-15 MED ORDER — HYDROMORPHONE HCL 1 MG/ML IJ SOLN
1.0000 mg | INTRAMUSCULAR | Status: DC | PRN
  Administered 2019-04-15 (×2): 1 mg via INTRAVENOUS
  Filled 2019-04-15 (×2): qty 1

## 2019-04-15 NOTE — Progress Notes (Signed)
Visited with patient while on morning rounds. Patient expressed some uncertainty about his condition and requested prayer. Daughter was also present and requested prayer. Will continue to provide spiritual care as needed.  Rev. [place].

## 2019-04-15 NOTE — Plan of Care (Signed)
  Problem: Pain Managment: Goal: General experience of comfort will improve Outcome: Progressing   Problem: Safety: Goal: Ability to remain free from injury will improve Outcome: Progressing   Problem: Skin Integrity: Goal: Risk for impaired skin integrity will decrease Outcome: Progressing   

## 2019-04-15 NOTE — Progress Notes (Signed)
Subjective: Seems confused this morning.  Initially said he was about the same, but then upon further questioning, states his pain has worsened this morning.  He is currently getting dilaudid for his pain while i'm in the room.  Objective: Vital signs in last 24 hours: Temp:  [97.4 F (36.3 C)-99.5 F (37.5 C)] 99.4 F (37.4 C) (10/01 0438) Pulse Rate:  [76-101] 81 (10/01 0438) Resp:  [16-163] 18 (10/01 0438) BP: (105-155)/(60-98) 105/66 (10/01 0438) SpO2:  [92 %-95 %] 93 % (10/01 0438) Last BM Date: 04/11/19  Intake/Output from previous day: 09/30 0701 - 10/01 0700 In: 2188.2 [I.V.:1086.2; IV Piggyback:1102] Out: 1100 [Urine:1100] Intake/Output this shift: No intake/output data recorded.  PE: Heart: irregular Lungs: CTAB Abd: soft, some bloating, but diffusely tender and guards with palpation somewhat diffusely, but greatest on left side of abdomen.  Lab Results:  Recent Labs    04/14/19 1237 04/15/19 0124  WBC 11.2* 10.3  HGB 17.5* 15.5  HCT 49.9 43.3  PLT 128* 107*   BMET Recent Labs    04/14/19 1237 04/15/19 0124  NA 137 137  K 3.9 3.4*  CL 99 104  CO2 24 21*  GLUCOSE 188* 149*  BUN 23 22  CREATININE 1.54* 1.82*  CALCIUM 9.1 7.7*   PT/INR Recent Labs    04/14/19 1237  LABPROT 16.7*  INR 1.4*   CMP     Component Value Date/Time   NA 137 04/15/2019 0124   K 3.4 (L) 04/15/2019 0124   CL 104 04/15/2019 0124   CO2 21 (L) 04/15/2019 0124   GLUCOSE 149 (H) 04/15/2019 0124   BUN 22 04/15/2019 0124   CREATININE 1.82 (H) 04/15/2019 0124   CALCIUM 7.7 (L) 04/15/2019 0124   PROT 7.8 04/14/2019 1237   ALBUMIN 3.9 04/14/2019 1237   AST 32 04/14/2019 1237   ALT 30 04/14/2019 1237   ALKPHOS 82 04/14/2019 1237   BILITOT 2.1 (H) 04/14/2019 1237   GFRNONAA 34 (L) 04/15/2019 0124   GFRAA 39 (L) 04/15/2019 0124   Lipase     Component Value Date/Time   LIPASE 22 04/14/2019 1237       Studies/Results: Ct Abdomen Pelvis W  Contrast  Result Date: 04/14/2019 CLINICAL DATA:  Abdominal pain for 5 days. History of right hemicolectomy and lysis of adhesions. EXAM: CT ABDOMEN AND PELVIS WITH CONTRAST TECHNIQUE: Multidetector CT imaging of the abdomen and pelvis was performed using the standard protocol following bolus administration of intravenous contrast. CONTRAST:  41mL OMNIPAQUE IOHEXOL 300 MG/ML  SOLN COMPARISON:  06/03/2018 and chest radiograph of 03/18/2019 FINDINGS: Lower chest: Mild mitral and aortic valve calcifications. Hepatobiliary: Stable 1.3 by 0.8 cm hypodense lesion in segment 2 of the liver on image 15/3. Stable 0.4 cm hypodense lesion in the right hepatic lobe on image 22/3. Other small hypodense hepatic lesions likewise appear stable. Gallbladder surgically absent. Mild extrahepatic biliary prominence is likely a physiologic response to cholecystectomy. Pancreas: Unremarkable Spleen: Unremarkable Adrenals/Urinary Tract: Small hypodense bilateral renal lesions favor cysts but are technically too small to characterize. Small parapelvic cysts are present bilaterally. The adrenal glands appear normal. Stomach/Bowel: Right hemicolectomy, anastomotic staple line also noted in the sigmoid colon. There is extensive free intraperitoneal gas in the upper abdomen as well as scattered additional locules of free intraperitoneal gas along the peritoneum. The patient has a side-to-side ileocolostomy. The presumed blind-ending distal loop of ileum extends down into the right abdomen into an area of inflammation tracking along adjacent  small bowel loops for example on image 50/3 and image 67/6. This region is a potential site for perforation given the local inflammatory findings. Scattered colonic diverticula are noted. Vascular/Lymphatic: Aortoiliac atherosclerotic vascular disease. Patent celiac trunk, superior mesenteric artery, and inferior mesenteric artery. No pathologic adenopathy is identified. Reproductive: Brachytherapy seed  implants in the prostate gland. Other: In addition to the free intraperitoneal gas there is abnormal inflammatory stranding in the right lower quadrant and central abdominal mesentery with a small amount of fluid tracking in this vicinity and along the adjacent omentum. Musculoskeletal: Small direct bilateral inguinal hernias. The right inguinal hernia contains a small amount of the edema tracking through the omentum. IMPRESSION: 1. Considerable free intraperitoneal gas. Exact site of perforation is uncertain but there appear to be inflammatory findings in the vicinity of the tip of the blind-ending loop of ileum from the side to side ileocolic anastomosis, and an adjacent loops of small bowel. Given that inflammation, this might be the site of perforation. The clinical team is aware of the free intraperitoneal gas based on prior radiography and their clinical notes. 2. Inflammatory edema in the mesentery and omentum particularly along the right and central abdomen. 3. Other imaging findings of potential clinical significance: Mild mitral and aortic valve calcifications. Small hypodense hepatic lesions are stable and probably benign. Aortic Atherosclerosis (ICD10-I70.0). Brachytherapy seeds in the prostate gland. Small bilateral direct inguinal hernias. Electronically Signed   By: Van Clines M.D.   On: 04/14/2019 15:39   Dg Chest Port 1 View  Addendum Date: 04/14/2019   ADDENDUM REPORT: 04/14/2019 12:10 ADDENDUM: These results were called by telephone at the time of interpretation on 04/14/2019 at 12:10 pm to provider [facility] , who verbally acknowledged these results. Electronically Signed   By: Zetta Bills M.D.   On: 04/14/2019 12:10   Result Date: 04/14/2019 CLINICAL DATA:  Abdominal pain, question free air. EXAM: PORTABLE CHEST 1 VIEW COMPARISON:  06/03/2018 FINDINGS: Free air is noted beneath the right hemidiaphragm. Cardiomediastinal contours are normal. Lungs are clear. No signs of  pleural effusion. No acute bone finding. IMPRESSION: Findings of free air in the abdomen. Further evaluation with CT may be helpful. At the current time a call is out to the referring provider to discuss findings. Electronically Signed: By: Zetta Bills M.D. On: 04/14/2019 12:05    Anti-infectives: Anti-infectives (From admission, onward)   Start     Dose/Rate Route Frequency Ordered Stop   04/14/19 1830  piperacillin-tazobactam (ZOSYN) IVPB 3.375 g     3.375 g 12.5 mL/hr over 240 Minutes Intravenous Every 8 hours 04/14/19 1343     04/14/19 1345  piperacillin-tazobactam (ZOSYN) IVPB 3.375 g  Status:  Discontinued     3.375 g 100 mL/hr over 30 Minutes Intravenous Every 6 hours 04/14/19 1334 04/14/19 1337   04/14/19 1145  piperacillin-tazobactam (ZOSYN) IVPB 3.375 g     3.375 g 100 mL/hr over 30 Minutes Intravenous  Once 04/14/19 1133 04/14/19 1303       Assessment/Plan A fib, on eliquis, being held Dementia HTN [id number] cancer of hepatic flexure, sigmoid stricture S/P robotic LOA, right colectomy, sigmoidectomy 11/26/17  Pneumoperitoneum of unclear etiology  -patient had an episode similar to this in November of 2019 and resolved without surgery. -he is tender today and seems to think he has more pain today than yesterday, but is confused about this and really unable to answer that question with any certainty. -cont NPO status today and follow closely for need to  go to OR  FEN - NPO VTE - SCDs ID - [email or web address]   LOS: 1 day    [name] , [facility] Surgery 04/15/2019, 9:19 AM Pager: [phone]

## 2019-04-15 NOTE — Progress Notes (Signed)
PROGRESS NOTE    Calvin Burnett  [id number] DOB: [birth year] DOA: 04/14/2019 PCP: Marin Olp, MD   Brief Narrative:  Calvin Burnett is a 85 y.o. male with medical history significant of A. fib on Eliquis, hypertension, GERD, Alzheimer's dementia, T1N0 adenocarcinoma status post robotic LOA, right colectomy, sigmoidectomy on 11/26/2017 presents to emergency department with acute abdominal pain that started this morning.  Patient reports pain 10 out of 10, aggravates with eating, relieved with nothing, denies association with nausea, vomiting, diarrhea, melena, hematemesis.  His last bowel movement was yesterday.  Patient admitted with similar symptoms on 06/03/2018 with free intra-abdominal air.  He had severe allergic reaction with Kcentra.  He was treated nonoperatively with antibiotics and bowel rest and discharged home in stable condition.  He had an follow-up colonoscopy on 7/20 which showed diverticulosis.  Denies fever, chills, cough, congestion, headache, blurry vision, chest pain, shortness of breath, palpitation, leg swelling, decreased appetite, weight loss, night sweats.  He lives alone at home and independent on daily life activities.  Denies smoking, alcohol, illicit drug use.  He takes Eliquis for the history of A. fib.  His last dose was this morning.  ED Course: Chest x-ray obtained which showed air under diaphragm.  CT abdomen/pelvis obtained which is pending.  EDP consulted general surgery for further evaluation and management.  Patient received morphine for pain control.   Assessment & Plan:   Principal Problem:   Perforation bowel ([place]) Active Problems:   Essential hypertension   GERD (gastroesophageal reflux disease)   Type 2 diabetes mellitus ([name])   AF (paroxysmal atrial fibrillation) ([name])   Hyperlipidemia   History of prostate cancer   History of colon cancer   Dementia ([name])   Bowel perforation: -As noted, patient presented with diffuse  abdominal pain, history of colon cancer status post LOA, right colectomy, sigmoidectomy on 11/26/2017. -chest x-ray which shows gas under diaphragm. -CT abdomen/pelvis ordered and is pending. -Received IV fluid bolus and IV Zosyn once in the ED. -EDP consulted general surgery-appreciate help=recs continuing to manage conservatively -We will keep him n.p.o. cont  IV fluids, morphine/dilaudid PRN for severe pain control, Zofran PRN for nausea and vomiting.  Continue IV Zosyn. -We will continue tohold Eliquis for now.  Hypertension (now soft in setting of pain meds) -I agree, Blood pressure was elevated-likely due to pain. -On HCTZ at home.  Will hold for now -Hydralazine as needed for blood pressure more than 160/100. -Cont to  monitor blood pressure closely.  Type 2 diabetes mellitus: -A1c 6.7.  Patient takes metformin at home.  Will hold for now. -cont sliding scale insulin and monitor blood sugar closely.  A. fib: Rate controlled. -On telemetry.  On metoprolol 25 mg p.o. daily at home.  Will hold for now due to n.p.o. status and soft bp -We will hold Eliquis for now-last dose of Eliquis this morning.  History of T1N0 adenocarcinoma of colon: -Status post right colectomy, sigmoidectomy on 11/26/2017  History of prostate cancer: aware -Patient denies any urinary symptoms currently.  Alzheimer dementia: Stable -We will hold memantine for now due to n.p.o. status.   DVT prophylaxis: SCD/Compression stockings  Code Status: full    Code Status Orders  (From admission, onward)         Start     Ordered   04/14/19 1256  Full code  Continuous     04/14/19 1257        Code Status History    Date Active  Date Inactive Code Status Order ID Comments User Context   06/03/2018 1542 06/09/2018 2119 Full Code [id number]  [facility]   11/26/2017 1602 12/02/2017 1403 Full Code [id number]  Michael Boston, MD Inpatient   09/27/2014 2323 09/30/2014 2027 Full Code  [id number]  Rise Patience, MD Inpatient   Advance Care Planning Activity     Family Communication: discussed in detail with pt, gen surgery discussed with daughter at bedside  Disposition Plan:   Patient remained inpatient for continued IV antibiotics, subspecialty consultation, and frequent labs.  Without these treatments patient at risk of severe life-threatening clinical deterioration Consults called: None Admission status: Inpatient   Consultants:   gen surgery  Procedures:  Ct Abdomen Pelvis W Contrast  Result Date: 04/14/2019 CLINICAL DATA:  Abdominal pain for 5 days. History of right hemicolectomy and lysis of adhesions. EXAM: CT ABDOMEN AND PELVIS WITH CONTRAST TECHNIQUE: Multidetector CT imaging of the abdomen and pelvis was performed using the standard protocol following bolus administration of intravenous contrast. CONTRAST:  34mL OMNIPAQUE IOHEXOL 300 MG/ML  SOLN COMPARISON:  06/03/2018 and chest radiograph of 03/18/2019 FINDINGS: Lower chest: Mild mitral and aortic valve calcifications. Hepatobiliary: Stable 1.3 by 0.8 cm hypodense lesion in segment 2 of the liver on image 15/3. Stable 0.4 cm hypodense lesion in the right hepatic lobe on image 22/3. Other small hypodense hepatic lesions likewise appear stable. Gallbladder surgically absent. Mild extrahepatic biliary prominence is likely a physiologic response to cholecystectomy. Pancreas: Unremarkable Spleen: Unremarkable Adrenals/Urinary Tract: Small hypodense bilateral renal lesions favor cysts but are technically too small to characterize. Small parapelvic cysts are present bilaterally. The adrenal glands appear normal. Stomach/Bowel: Right hemicolectomy, anastomotic staple line also noted in the sigmoid colon. There is extensive free intraperitoneal gas in the upper abdomen as well as scattered additional locules of free intraperitoneal gas along the peritoneum. The patient has a side-to-side ileocolostomy. The presumed  blind-ending distal loop of ileum extends down into the right abdomen into an area of inflammation tracking along adjacent small bowel loops for example on image 50/3 and image 67/6. This region is a potential site for perforation given the local inflammatory findings. Scattered colonic diverticula are noted. Vascular/Lymphatic: Aortoiliac atherosclerotic vascular disease. Patent celiac trunk, superior mesenteric artery, and inferior mesenteric artery. No pathologic adenopathy is identified. Reproductive: Brachytherapy seed implants in the prostate gland. Other: In addition to the free intraperitoneal gas there is abnormal inflammatory stranding in the right lower quadrant and central abdominal mesentery with a small amount of fluid tracking in this vicinity and along the adjacent omentum. Musculoskeletal: Small direct bilateral inguinal hernias. The right inguinal hernia contains a small amount of the edema tracking through the omentum. IMPRESSION: 1. Considerable free intraperitoneal gas. Exact site of perforation is uncertain but there appear to be inflammatory findings in the vicinity of the tip of the blind-ending loop of ileum from the side to side ileocolic anastomosis, and an adjacent loops of small bowel. Given that inflammation, this might be the site of perforation. The clinical team is aware of the free intraperitoneal gas based on prior radiography and their clinical notes. 2. Inflammatory edema in the mesentery and omentum particularly along the right and central abdomen. 3. Other imaging findings of potential clinical significance: Mild mitral and aortic valve calcifications. Small hypodense hepatic lesions are stable and probably benign. Aortic Atherosclerosis (ICD10-I70.0). Brachytherapy seeds in the prostate gland. Small bilateral direct inguinal hernias. Electronically Signed   By: Van Clines M.D.   On:  04/14/2019 15:39   Dg Chest Port 1 View  Addendum Date: 04/14/2019   ADDENDUM  REPORT: 04/14/2019 12:10 ADDENDUM: These results were called by telephone at the time of interpretation on 04/14/2019 at 12:10 pm to provider [facility] , who verbally acknowledged these results. Electronically Signed   By: Zetta Bills M.D.   On: 04/14/2019 12:10   Result Date: 04/14/2019 CLINICAL DATA:  Abdominal pain, question free air. EXAM: PORTABLE CHEST 1 VIEW COMPARISON:  06/03/2018 FINDINGS: Free air is noted beneath the right hemidiaphragm. Cardiomediastinal contours are normal. Lungs are clear. No signs of pleural effusion. No acute bone finding. IMPRESSION: Findings of free air in the abdomen. Further evaluation with CT may be helpful. At the current time a call is out to the referring provider to discuss findings. Electronically Signed: By: Zetta Bills M.D. On: 04/14/2019 12:05     Antimicrobials:   Zosyn >9/30   Subjective: Reported abdominal pain this morning relieved with Dilaudid Resting in bed comfortably currently  Objective: Vitals:   04/14/19 1600 04/14/19 1648 04/14/19 2041 04/15/19 0438  BP: 124/65 127/82 109/71 105/66  Pulse: 99  (!) 101 81  Resp: (!) 23 18 16 18   Temp:  99.5 F (37.5 C) 98.6 F (37 C) 99.4 F (37.4 C)  TempSrc:  Oral Oral Oral  SpO2: 93% 95% 94% 93%    Intake/Output Summary (Last 24 hours) at 04/15/2019 1153 Last data filed at 04/15/2019 0800 Gross per 24 hour  Intake 2188.17 ml  Output 1100 ml  Net 1088.17 ml   There were no vitals filed for this visit.  Examination:  General exam: Appears calm and comfortable  Respiratory system: Clear to auscultation. Respiratory effort normal. Cardiovascular system: S1 & S2 heard, RRR. No JVD, murmurs, rubs, gallops or clicks. No pedal edema. Gastrointestinal system: Abdomen is nondistended, soft and nontender all exam blunted by IV opioids  central nervous system: Alert and oriented. No focal neurological deficits. Extremities: Warm well perfused, no edema Skin: No rashes, lesions or  ulcers Psychiatry: Judgement and insight impaired by dementia with cognitive deficit. Mood & affect appropriate.     Data Reviewed: I have personally reviewed following labs and imaging studies  CBC: Recent Labs  Lab 04/14/19 1237 04/15/19 0124  WBC 11.2* 10.3  NEUTROABS 9.0*  --   HGB 17.5* 15.5  HCT 49.9 43.3  MCV 94.9 92.9  PLT 128* [id number]*   Basic Metabolic Panel: Recent Labs  Lab 04/14/19 1237 04/15/19 0124  NA 137 137  K 3.9 3.4*  CL 99 104  CO2 24 21*  GLUCOSE 188* 149*  BUN 23 22  CREATININE 1.54* 1.82*  CALCIUM 9.1 7.7*   GFR: CrCl cannot be calculated (Unknown ideal weight.). Liver Function Tests: Recent Labs  Lab 04/14/19 1237  AST 32  ALT 30  ALKPHOS 82  BILITOT 2.1*  PROT 7.8  ALBUMIN 3.9   Recent Labs  Lab 04/14/19 1237  LIPASE 22   No results for input(s): AMMONIA in the last 168 hours. Coagulation Profile: Recent Labs  Lab 04/14/19 1237  INR 1.4*   Cardiac Enzymes: No results for input(s): CKTOTAL, CKMB, CKMBINDEX, TROPONINI in the last 168 hours. BNP (last 3 results) Recent Labs    01/29/19 0852  PROBNP 165.0*   HbA1C: Recent Labs    04/14/19 1237  HGBA1C 6.7*   CBG: Recent Labs  Lab 04/14/19 1730 04/14/19 2041 04/15/19 0025 04/15/19 0440 04/15/19 0817  GLUCAP 167* 119* 148* 166* 171*   Lipid  Profile: No results for input(s): CHOL, HDL, LDLCALC, TRIG, CHOLHDL, LDLDIRECT in the last 72 hours. Thyroid Function Tests: No results for input(s): TSH, T4TOTAL, FREET4, T3FREE, THYROIDAB in the last 72 hours. Anemia Panel: No results for input(s): VITAMINB12, FOLATE, FERRITIN, TIBC, IRON, RETICCTPCT in the last 72 hours. Sepsis Labs: Recent Labs  Lab 04/14/19 1230 04/14/19 1528  LATICACIDVEN 3.6* 2.1*    Recent Results (from the past 240 hour(s))  SARS Coronavirus 2 [facility] order, Performed in [facility] hospital lab) Nasopharyngeal Nasopharyngeal Swab     Status: None   Collection Time: 04/14/19 12:38 PM    Specimen: Nasopharyngeal Swab  Result Value Ref Range Status   SARS Coronavirus 2 NEGATIVE NEGATIVE Final    Comment: (NOTE) If result is NEGATIVE SARS-CoV-2 target nucleic acids are NOT DETECTED. The SARS-CoV-2 RNA is generally detectable in upper and lower  respiratory specimens during the acute phase of infection. The lowest  concentration of SARS-CoV-2 viral copies this assay can detect is 250  copies / mL. A negative result does not preclude SARS-CoV-2 infection  and should not be used as the sole basis for treatment or other  patient management decisions.  A negative result may occur with  improper specimen collection / handling, submission of specimen other  than nasopharyngeal swab, presence of viral mutation(s) within the  areas targeted by this assay, and inadequate number of viral copies  (<250 copies / mL). A negative result must be combined with clinical  observations, patient history, and epidemiological information. If result is POSITIVE SARS-CoV-2 target nucleic acids are DETECTED. The SARS-CoV-2 RNA is generally detectable in upper and lower  respiratory specimens dur ing the acute phase of infection.  Positive  results are indicative of active infection with SARS-CoV-2.  Clinical  correlation with patient history and other diagnostic information is  necessary to determine patient infection status.  Positive results do  not rule out bacterial infection or co-infection with other viruses. If result is PRESUMPTIVE POSTIVE SARS-CoV-2 nucleic acids MAY BE PRESENT.   A presumptive positive result was obtained on the submitted specimen  and confirmed on repeat testing.  While 2019 novel coronavirus  (SARS-CoV-2) nucleic acids may be present in the submitted sample  additional confirmatory testing may be necessary for epidemiological  and / or clinical management purposes  to differentiate between  SARS-CoV-2 and other Sarbecovirus currently known to infect humans.  If  clinically indicated additional testing with an alternate test  methodology [phone]) is advised. The SARS-CoV-2 RNA is generally  detectable in upper and lower respiratory sp ecimens during the acute  phase of infection. The expected result is Negative. Fact Sheet for Patients:  StrictlyIdeas.no Fact Sheet for Healthcare Providers: BankingDealers.co.za This test is not yet approved or cleared by the [place] FDA and has been authorized for detection and/or diagnosis of SARS-CoV-2 by FDA under an Emergency Use Authorization (EUA).  This EUA will remain in effect (meaning this test can be used) for the duration of the COVID-19 declaration under Section 564(b)(1) of the Act, 21 U.S.C. section 360bbb-3(b)(1), unless the authorization is terminated or revoked sooner. Performed at [facility], [place] [address] [place], [place] [address]          Radiology Studies: Ct Abdomen Pelvis W Contrast  Result Date: 04/14/2019 CLINICAL DATA:  Abdominal pain for 5 days. History of right hemicolectomy and lysis of adhesions. EXAM: CT ABDOMEN AND PELVIS WITH CONTRAST TECHNIQUE: Multidetector CT imaging of the abdomen and pelvis was performed  using the standard protocol following bolus administration of intravenous contrast. CONTRAST:  37mL OMNIPAQUE IOHEXOL 300 MG/ML  SOLN COMPARISON:  06/03/2018 and chest radiograph of 03/18/2019 FINDINGS: Lower chest: Mild mitral and aortic valve calcifications. Hepatobiliary: Stable 1.3 by 0.8 cm hypodense lesion in segment 2 of the liver on image 15/3. Stable 0.4 cm hypodense lesion in the right hepatic lobe on image 22/3. Other small hypodense hepatic lesions likewise appear stable. Gallbladder surgically absent. Mild extrahepatic biliary prominence is likely a physiologic response to cholecystectomy. Pancreas: Unremarkable Spleen: Unremarkable Adrenals/Urinary Tract: Small hypodense bilateral renal lesions  favor cysts but are technically too small to characterize. Small parapelvic cysts are present bilaterally. The adrenal glands appear normal. Stomach/Bowel: Right hemicolectomy, anastomotic staple line also noted in the sigmoid colon. There is extensive free intraperitoneal gas in the upper abdomen as well as scattered additional locules of free intraperitoneal gas along the peritoneum. The patient has a side-to-side ileocolostomy. The presumed blind-ending distal loop of ileum extends down into the right abdomen into an area of inflammation tracking along adjacent small bowel loops for example on image 50/3 and image 67/6. This region is a potential site for perforation given the local inflammatory findings. Scattered colonic diverticula are noted. Vascular/Lymphatic: Aortoiliac atherosclerotic vascular disease. Patent celiac trunk, superior mesenteric artery, and inferior mesenteric artery. No pathologic adenopathy is identified. Reproductive: Brachytherapy seed implants in the prostate gland. Other: In addition to the free intraperitoneal gas there is abnormal inflammatory stranding in the right lower quadrant and central abdominal mesentery with a small amount of fluid tracking in this vicinity and along the adjacent omentum. Musculoskeletal: Small direct bilateral inguinal hernias. The right inguinal hernia contains a small amount of the edema tracking through the omentum. IMPRESSION: 1. Considerable free intraperitoneal gas. Exact site of perforation is uncertain but there appear to be inflammatory findings in the vicinity of the tip of the blind-ending loop of ileum from the side to side ileocolic anastomosis, and an adjacent loops of small bowel. Given that inflammation, this might be the site of perforation. The clinical team is aware of the free intraperitoneal gas based on prior radiography and their clinical notes. 2. Inflammatory edema in the mesentery and omentum particularly along the right and central  abdomen. 3. Other imaging findings of potential clinical significance: Mild mitral and aortic valve calcifications. Small hypodense hepatic lesions are stable and probably benign. Aortic Atherosclerosis (ICD10-I70.0). Brachytherapy seeds in the prostate gland. Small bilateral direct inguinal hernias. Electronically Signed   By: Van Clines M.D.   On: 04/14/2019 15:39   Dg Chest Port 1 View  Addendum Date: 04/14/2019   ADDENDUM REPORT: 04/14/2019 12:10 ADDENDUM: These results were called by telephone at the time of interpretation on 04/14/2019 at 12:10 pm to provider [facility] , who verbally acknowledged these results. Electronically Signed   By: Zetta Bills M.D.   On: 04/14/2019 12:10   Result Date: 04/14/2019 CLINICAL DATA:  Abdominal pain, question free air. EXAM: PORTABLE CHEST 1 VIEW COMPARISON:  06/03/2018 FINDINGS: Free air is noted beneath the right hemidiaphragm. Cardiomediastinal contours are normal. Lungs are clear. No signs of pleural effusion. No acute bone finding. IMPRESSION: Findings of free air in the abdomen. Further evaluation with CT may be helpful. At the current time a call is out to the referring provider to discuss findings. Electronically Signed: By: Zetta Bills M.D. On: 04/14/2019 12:05        Scheduled Meds:  insulin aspart  0-15 Units Subcutaneous TID WC  insulin aspart  0-5 Units Subcutaneous QHS   Continuous Infusions:  sodium chloride 75 mL/hr at 04/15/19 0141   sodium chloride Stopped (04/14/19 1731)   piperacillin-tazobactam (ZOSYN)  IV 3.375 g (04/15/19 1005)     LOS: 1 day    Time spent: 8 min    Nicolette Bang, MD [name]  If 7PM-7AM, please contact night-coverage  04/15/2019, 11:53 AM

## 2019-04-16 ENCOUNTER — Inpatient Hospital Stay (HOSPITAL_COMMUNITY): Payer: PPO

## 2019-04-16 DIAGNOSIS — K668 Other specified disorders of peritoneum: Secondary | ICD-10-CM

## 2019-04-16 DIAGNOSIS — R198 Other specified symptoms and signs involving the digestive system and abdomen: Secondary | ICD-10-CM

## 2019-04-16 LAB — GLUCOSE, CAPILLARY
Glucose-Capillary: 145 mg/dL — ABNORMAL HIGH (ref 70–99)
Glucose-Capillary: 154 mg/dL — ABNORMAL HIGH (ref 70–99)
Glucose-Capillary: 154 mg/dL — ABNORMAL HIGH (ref 70–99)
Glucose-Capillary: 171 mg/dL — ABNORMAL HIGH (ref 70–99)
Glucose-Capillary: 82 mg/dL (ref 70–99)
Glucose-Capillary: 96 mg/dL (ref 70–99)

## 2019-04-16 LAB — BASIC METABOLIC PANEL
Anion gap: 10 (ref 5–15)
BUN: 28 mg/dL — ABNORMAL HIGH (ref 8–23)
CO2: 23 mmol/L (ref 22–32)
Calcium: 7.3 mg/dL — ABNORMAL LOW (ref 8.9–10.3)
Chloride: 108 mmol/L (ref 98–111)
Creatinine, Ser: 1.62 mg/dL — ABNORMAL HIGH (ref 0.61–1.24)
GFR calc Af Amer: 45 mL/min — ABNORMAL LOW (ref >60)
GFR calc non Af Amer: 39 mL/min — ABNORMAL LOW (ref >60)
Glucose, Bld: 156 mg/dL — ABNORMAL HIGH (ref 70–99)
Potassium: 3.4 mmol/L — ABNORMAL LOW (ref 3.5–5.1)
Sodium: 141 mmol/L (ref 135–145)

## 2019-04-16 MED ORDER — LORAZEPAM 2 MG/ML IJ SOLN
0.5000 mg | Freq: Once | INTRAMUSCULAR | Status: AC
  Administered 2019-04-16: 23:00:00 0.5 mg via INTRAVENOUS
  Filled 2019-04-16: qty 1

## 2019-04-16 NOTE — Progress Notes (Signed)
Patient ID: Calvin Burnett, male   DOB: 08-Nov-1933, 85 y.o.   MRN: [id number]       Subjective: Hasn't had pain meds since last night.  Feels his pain is slightly improved.  No nausea.  No bowel function that he knows of.  Objective: Vital signs in last 24 hours: Temp:  [97.7 F (36.5 C)-98.7 F (37.1 C)] 97.7 F (36.5 C) (10/02 0422) Pulse Rate:  [93-100] 94 (10/02 0422) Resp:  [18-20] 18 (10/02 0422) BP: (122-131)/(75-80) 122/80 (10/02 0422) SpO2:  [93 %-95 %] 93 % (10/02 0422) Last BM Date: 04/11/19  Intake/Output from previous day: 10/01 0701 - 10/02 0700 In: 1844.1 [I.V.:1694.7; IV Piggyback:149.3] Out: -  Intake/Output this shift: Total I/O In: -  Out: 380 [Urine:380]  PE: Heart: irregular Lungs: CTAB Abd: soft, slightly less tender than yesterday, few BS  Lab Results:  Recent Labs    04/14/19 1237 04/15/19 0124  WBC 11.2* 10.3  HGB 17.5* 15.5  HCT 49.9 43.3  PLT 128* 107*   BMET Recent Labs    04/14/19 1237 04/15/19 0124  NA 137 137  K 3.9 3.4*  CL 99 104  CO2 24 21*  GLUCOSE 188* 149*  BUN 23 22  CREATININE 1.54* 1.82*  CALCIUM 9.1 7.7*   PT/INR Recent Labs    04/14/19 1237  LABPROT 16.7*  INR 1.4*   CMP     Component Value Date/Time   NA 137 04/15/2019 0124   K 3.4 (L) 04/15/2019 0124   CL 104 04/15/2019 0124   CO2 21 (L) 04/15/2019 0124   GLUCOSE 149 (H) 04/15/2019 0124   BUN 22 04/15/2019 0124   CREATININE 1.82 (H) 04/15/2019 0124   CALCIUM 7.7 (L) 04/15/2019 0124   PROT 7.8 04/14/2019 1237   ALBUMIN 3.9 04/14/2019 1237   AST 32 04/14/2019 1237   ALT 30 04/14/2019 1237   ALKPHOS 82 04/14/2019 1237   BILITOT 2.1 (H) 04/14/2019 1237   GFRNONAA 34 (L) 04/15/2019 0124   GFRAA 39 (L) 04/15/2019 0124   Lipase     Component Value Date/Time   LIPASE 22 04/14/2019 1237       Studies/Results: Ct Abdomen Pelvis W Contrast  Result Date: 04/14/2019 CLINICAL DATA:  Abdominal pain for 5 days. History of right hemicolectomy  and lysis of adhesions. EXAM: CT ABDOMEN AND PELVIS WITH CONTRAST TECHNIQUE: Multidetector CT imaging of the abdomen and pelvis was performed using the standard protocol following bolus administration of intravenous contrast. CONTRAST:  81mL OMNIPAQUE IOHEXOL 300 MG/ML  SOLN COMPARISON:  06/03/2018 and chest radiograph of 03/18/2019 FINDINGS: Lower chest: Mild mitral and aortic valve calcifications. Hepatobiliary: Stable 1.3 by 0.8 cm hypodense lesion in segment 2 of the liver on image 15/3. Stable 0.4 cm hypodense lesion in the right hepatic lobe on image 22/3. Other small hypodense hepatic lesions likewise appear stable. Gallbladder surgically absent. Mild extrahepatic biliary prominence is likely a physiologic response to cholecystectomy. Pancreas: Unremarkable Spleen: Unremarkable Adrenals/Urinary Tract: Small hypodense bilateral renal lesions favor cysts but are technically too small to characterize. Small parapelvic cysts are present bilaterally. The adrenal glands appear normal. Stomach/Bowel: Right hemicolectomy, anastomotic staple line also noted in the sigmoid colon. There is extensive free intraperitoneal gas in the upper abdomen as well as scattered additional locules of free intraperitoneal gas along the peritoneum. The patient has a side-to-side ileocolostomy. The presumed blind-ending distal loop of ileum extends down into the right abdomen into an area of inflammation tracking along adjacent small bowel loops  for example on image 50/3 and image 67/6. This region is a potential site for perforation given the local inflammatory findings. Scattered colonic diverticula are noted. Vascular/Lymphatic: Aortoiliac atherosclerotic vascular disease. Patent celiac trunk, superior mesenteric artery, and inferior mesenteric artery. No pathologic adenopathy is identified. Reproductive: Brachytherapy seed implants in the prostate gland. Other: In addition to the free intraperitoneal gas there is abnormal  inflammatory stranding in the right lower quadrant and central abdominal mesentery with a small amount of fluid tracking in this vicinity and along the adjacent omentum. Musculoskeletal: Small direct bilateral inguinal hernias. The right inguinal hernia contains a small amount of the edema tracking through the omentum. IMPRESSION: 1. Considerable free intraperitoneal gas. Exact site of perforation is uncertain but there appear to be inflammatory findings in the vicinity of the tip of the blind-ending loop of ileum from the side to side ileocolic anastomosis, and an adjacent loops of small bowel. Given that inflammation, this might be the site of perforation. The clinical team is aware of the free intraperitoneal gas based on prior radiography and their clinical notes. 2. Inflammatory edema in the mesentery and omentum particularly along the right and central abdomen. 3. Other imaging findings of potential clinical significance: Mild mitral and aortic valve calcifications. Small hypodense hepatic lesions are stable and probably benign. Aortic Atherosclerosis (ICD10-I70.0). Brachytherapy seeds in the prostate gland. Small bilateral direct inguinal hernias. Electronically Signed   By: Van Clines M.D.   On: 04/14/2019 15:39   Dg Chest Port 1 View  Addendum Date: 04/14/2019   ADDENDUM REPORT: 04/14/2019 12:10 ADDENDUM: These results were called by telephone at the time of interpretation on 04/14/2019 at 12:10 pm to provider [facility] , who verbally acknowledged these results. Electronically Signed   By: Zetta Bills M.D.   On: 04/14/2019 12:10   Result Date: 04/14/2019 CLINICAL DATA:  Abdominal pain, question free air. EXAM: PORTABLE CHEST 1 VIEW COMPARISON:  06/03/2018 FINDINGS: Free air is noted beneath the right hemidiaphragm. Cardiomediastinal contours are normal. Lungs are clear. No signs of pleural effusion. No acute bone finding. IMPRESSION: Findings of free air in the abdomen. Further  evaluation with CT may be helpful. At the current time a call is out to the referring provider to discuss findings. Electronically Signed: By: Zetta Bills M.D. On: 04/14/2019 12:05    Anti-infectives: Anti-infectives (From admission, onward)   Start     Dose/Rate Route Frequency Ordered Stop   04/14/19 1830  piperacillin-tazobactam (ZOSYN) IVPB 3.375 g     3.375 g 12.5 mL/hr over 240 Minutes Intravenous Every 8 hours 04/14/19 1343     04/14/19 1345  piperacillin-tazobactam (ZOSYN) IVPB 3.375 g  Status:  Discontinued     3.375 g 100 mL/hr over 30 Minutes Intravenous Every 6 hours 04/14/19 1334 04/14/19 1337   04/14/19 1145  piperacillin-tazobactam (ZOSYN) IVPB 3.375 g     3.375 g 100 mL/hr over 30 Minutes Intravenous  Once 04/14/19 1133 04/14/19 1303       Assessment/Plan A fib, on eliquis, being held Dementia HTN [id number] cancer of hepatic flexure, sigmoid stricture S/P robotic LOA, right colectomy, sigmoidectomy 11/26/17  Pneumoperitoneum of unclear etiology  -patient had an episode similar to this in November of 2019 and resolved without surgery. -he thinks he is less tender today.  Vitals stable -will try some clear liquids, but if he worsens may require surgery.  FEN - CLD VTE - SCDs ID - Zosyn   LOS: 2 days    [name] ,  [facility] Surgery 04/16/2019, 8:21 AM Pager: [phone]

## 2019-04-16 NOTE — Consult Note (Signed)
   [facility] CM Inpatient Consult   04/16/2019  Calvin Burnett 1933/11/15 [id number]    Patient was checked for risk score of 19% for unplanned readmission and hospitalization, and for need of [facility] care management services.  Patient had prior active engagement with [facility] coordinators in the distant past.  Review of chart and MD history and physical on 04/14/19 reveal as:    Calvin Burnett a 85 y.o.malewith medical history significant ofA. fib on Eliquis, hypertension, GERD, Alzheimer's dementia, T1N0 adenocarcinoma status post robotic LOA, right colectomy, sigmoidectomy on 11/26/2017,   presented to emergency department with acute abdominal pain aggravated with eating, relieved with nothing, denies association with nausea, vomiting, diarrhea, melena, hematemesis; last bowel movement was yesterday. Patient admitted with similar symptoms on 06/03/2018 with free intra-abdominal air.He had severe allergic reaction with Kcentra. He was treated nonoperatively with antibiotics and bowel rest.He had a follow-up colonoscopy on 7/20 which showed diverticulosis. He lives alone at home and independent on daily life activities.   Notedpatient is currently listed as an [facility]aged [facility] patient. He will be followed by [facility] in the community, with full case management services.  [facility] care management services are not appropriate at this time.  Will sign off.   For questions, please contact:  Edwena Felty A. Matti Killingsworth, BSN, RN-BC [facility] Liaison Cell: [phone]

## 2019-04-16 NOTE — Progress Notes (Signed)
PROGRESS NOTE    Calvin Burnett  [id number] DOB: 1933/11/03 DOA: 04/14/2019 PCP: Marin Olp, MD   Brief Narrative:  Calvin Keys Turneris a 85 y.o.malewith medical history significant ofA. fib on Eliquis, hypertension, GERD, Alzheimer's dementia, T1N0 adenocarcinoma status post robotic LOA, right colectomy, sigmoidectomy on 11/26/2017 presents to emergency department with acute abdominal pain that started this morning. Patient reports pain 10 out of 10, aggravates with eating, relieved with nothing, denies association with nausea, vomiting, diarrhea, melena, hematemesis. His last bowel movement was yesterday.  Patient admitted with similar symptoms on 06/03/2018 with free intra-abdominal air.He had severe allergic reaction with Kcentra. He was treated nonoperatively with antibiotics and bowel rest and discharged home in stable condition.He had an follow-up colonoscopy on 7/20 which showed diverticulosis.  Denies fever, chills, cough, congestion, headache, blurry vision, chest pain, shortness of breath, palpitation, leg swelling, decreased appetite, weight loss, night sweats.  He lives alone at home and independent on daily life activities. Denies smoking, alcohol, illicit drug use. He takes Eliquis for the history of A. fib. His last dose was this morning.  ED Course:Chest x-ray obtained which showed air under diaphragm. CT abdomen/pelvis obtained which is pending. EDP consulted general surgery for further evaluation and management. Patient received morphine for pain control.   Assessment & Plan:   Principal Problem:   Perforation bowel ([place]) Active Problems:   Essential hypertension   GERD (gastroesophageal reflux disease)   Type 2 diabetes mellitus ([name])   AF (paroxysmal atrial fibrillation) ([name])   Hyperlipidemia   History of prostate cancer   History of colon cancer   Dementia ([name])   Bowel perforation: -As noted, patient presented with diffuse  abdominal pain, history of colon cancer status postLOA,right colectomy, sigmoidectomy on 11/26/2017. -chest x-ray which showed gas under diaphragm.Repeat today negative -Received IV fluid bolus and IV Zosynoncein the ED. -EDP consulted general surgery-appreciate help=recs continuing to manage conservatively -clears today, cont  IV fluids, morphine/dilaudidPRN for severe paincontrol, Zofran PRN for nausea and vomiting.Continue IV Zosyn. -We will continue tohold Eliquis for now.  Hypertension (now soft in setting of pain meds) -I agree, Blood pressure was elevated-likelydue to pain. -On HCTZ at home. Will hold for now -Hydralazine as needed for blood pressure more than 160/100. -Cont to  monitor blood pressure closely.  Type 2 diabetes mellitus: -A1c 6.7. Patient takes metformin at home. Will hold for now. -cont sliding scale insulin and monitor blood sugar closely.  A. fib: Rate controlled. -On telemetry.On metoprolol 25 mg p.o. daily at home. Will hold for now due to n.p.o. status and soft bp -We will hold Eliquis for now-last dose of Eliquis this morning.  History of T1N0 adenocarcinoma of colon: -Status post right colectomy, sigmoidectomy on 11/26/2017  History of prostate cancer:[place] -Patient denies any urinary symptoms currently.  Alzheimer dementia: Stable -We will hold memantine for now due to n.p.o. status.  DVT prophylaxis: SCD/Compression stockings  Code Status: full    Code Status Orders  (From admission, onward)         Start     Ordered   04/14/19 1256  Full code  Continuous     04/14/19 1257        Code Status History    Date Active Date Inactive Code Status Order ID Comments User Context   06/03/2018 1542 06/09/2018 2119 Full Code [id number]  [facility]   11/26/2017 1602 12/02/2017 1403 Full Code [id number]  Michael Boston, MD Inpatient   09/27/2014 [phone]  09/30/2014 2027 Full Code [id number]  Rise Patience, MD  Inpatient   Advance Care Planning Activity     Family Communication: Discussed in detail with patient,   Disposition Plan:   Patient remained inpatient for continued IV antibiotics, subspecialty consultation, and frequent labs.  Without these treatments patient at risk of severe life-threatening clinical deterioration Consults called: None Admission status: Inpatient   Consultants:   gen surg  Procedures:  Dg Abd 1 View  Result Date: 04/16/2019 CLINICAL DATA:  Pneumoperitoneum EXAM: ABDOMEN - 1 VIEW COMPARISON:  06/06/2018, CT 04/14/2019 FINDINGS: Gaseous distension of the transverse colon, favor ileus. Prior cholecystectomy. No visible pneumoperitoneum as seen on prior CT. No organomegaly. Radiation seeds in the region of the prostate. IMPRESSION: No visible pneumoperitoneum on today's study. Electronically Signed   By: Rolm Baptise M.D.   On: 04/16/2019 08:38   Ct Abdomen Pelvis W Contrast  Result Date: 04/14/2019 CLINICAL DATA:  Abdominal pain for 5 days. History of right hemicolectomy and lysis of adhesions. EXAM: CT ABDOMEN AND PELVIS WITH CONTRAST TECHNIQUE: Multidetector CT imaging of the abdomen and pelvis was performed using the standard protocol following bolus administration of intravenous contrast. CONTRAST:  39mL OMNIPAQUE IOHEXOL 300 MG/ML  SOLN COMPARISON:  06/03/2018 and chest radiograph of 03/18/2019 FINDINGS: Lower chest: Mild mitral and aortic valve calcifications. Hepatobiliary: Stable 1.3 by 0.8 cm hypodense lesion in segment 2 of the liver on image 15/3. Stable 0.4 cm hypodense lesion in the right hepatic lobe on image 22/3. Other small hypodense hepatic lesions likewise appear stable. Gallbladder surgically absent. Mild extrahepatic biliary prominence is likely a physiologic response to cholecystectomy. Pancreas: Unremarkable Spleen: Unremarkable Adrenals/Urinary Tract: Small hypodense bilateral renal lesions favor cysts but are technically too small to characterize. Small  parapelvic cysts are present bilaterally. The adrenal glands appear normal. Stomach/Bowel: Right hemicolectomy, anastomotic staple line also noted in the sigmoid colon. There is extensive free intraperitoneal gas in the upper abdomen as well as scattered additional locules of free intraperitoneal gas along the peritoneum. The patient has a side-to-side ileocolostomy. The presumed blind-ending distal loop of ileum extends down into the right abdomen into an area of inflammation tracking along adjacent small bowel loops for example on image 50/3 and image 67/6. This region is a potential site for perforation given the local inflammatory findings. Scattered colonic diverticula are noted. Vascular/Lymphatic: Aortoiliac atherosclerotic vascular disease. Patent celiac trunk, superior mesenteric artery, and inferior mesenteric artery. No pathologic adenopathy is identified. Reproductive: Brachytherapy seed implants in the prostate gland. Other: In addition to the free intraperitoneal gas there is abnormal inflammatory stranding in the right lower quadrant and central abdominal mesentery with a small amount of fluid tracking in this vicinity and along the adjacent omentum. Musculoskeletal: Small direct bilateral inguinal hernias. The right inguinal hernia contains a small amount of the edema tracking through the omentum. IMPRESSION: 1. Considerable free intraperitoneal gas. Exact site of perforation is uncertain but there appear to be inflammatory findings in the vicinity of the tip of the blind-ending loop of ileum from the side to side ileocolic anastomosis, and an adjacent loops of small bowel. Given that inflammation, this might be the site of perforation. The clinical team is aware of the free intraperitoneal gas based on prior radiography and their clinical notes. 2. Inflammatory edema in the mesentery and omentum particularly along the right and central abdomen. 3. Other imaging findings of potential clinical  significance: Mild mitral and aortic valve calcifications. Small hypodense hepatic lesions are stable and probably  benign. Aortic Atherosclerosis (ICD10-I70.0). Brachytherapy seeds in the prostate gland. Small bilateral direct inguinal hernias. Electronically Signed   By: Van Clines M.D.   On: 04/14/2019 15:39   Dg Chest Port 1 View  Addendum Date: 04/14/2019   ADDENDUM REPORT: 04/14/2019 12:10 ADDENDUM: These results were called by telephone at the time of interpretation on 04/14/2019 at 12:10 pm to provider [facility] , who verbally acknowledged these results. Electronically Signed   By: Zetta Bills M.D.   On: 04/14/2019 12:10   Result Date: 04/14/2019 CLINICAL DATA:  Abdominal pain, question free air. EXAM: PORTABLE CHEST 1 VIEW COMPARISON:  06/03/2018 FINDINGS: Free air is noted beneath the right hemidiaphragm. Cardiomediastinal contours are normal. Lungs are clear. No signs of pleural effusion. No acute bone finding. IMPRESSION: Findings of free air in the abdomen. Further evaluation with CT may be helpful. At the current time a call is out to the referring provider to discuss findings. Electronically Signed: By: Zetta Bills M.D. On: 04/14/2019 12:05     Antimicrobials:   Zosyn >9/30    Subjective: Reports no current pain no recent pain meds Improving  Objective: Vitals:   04/15/19 0438 04/15/19 1449 04/15/19 1948 04/16/19 0422  BP: 105/66 124/76 131/75 122/80  Pulse: 81 93 100 94  Resp: 18 20 20 18   Temp: 99.4 F (37.4 C) 98.2 F (36.8 C) 98.7 F (37.1 C) 97.7 F (36.5 C)  TempSrc: Oral Oral Oral Oral  SpO2: 93% 93% 95% 93%    Intake/Output Summary (Last 24 hours) at 04/16/2019 0953 Last data filed at 04/16/2019 0740 Gross per 24 hour  Intake 1844.06 ml  Output 380 ml  Net 1464.06 ml   There were no vitals filed for this visit.  Examination:  General exam: Appears calm and comfortable  Respiratory system: Clear to auscultation. Respiratory effort  normal. Cardiovascular system: S1 & S2 heard, RRR. No JVD, murmurs, rubs, gallops or clicks. No pedal edema. Gastrointestinal system: Abdomen is nondistended, soft and mildly tender with palpation. No organomegaly or masses felt. Normal bowel sounds heard. Central nervous system: Alert and oriented. No focal neurological deficits. Extremities: Was all 4 extremities freely, warm well perfused, no contractures Skin: No rashes, lesions or ulcers Psychiatry: Judgement and insight appear normal. Mood & affect appropriate.     Data Reviewed: I have personally reviewed following labs and imaging studies  CBC: Recent Labs  Lab 04/14/19 1237 04/15/19 0124  WBC 11.2* 10.3  NEUTROABS 9.0*  --   HGB 17.5* 15.5  HCT 49.9 43.3  MCV 94.9 92.9  PLT 128* [id number]*   Basic Metabolic Panel: Recent Labs  Lab 04/14/19 1237 04/15/19 0124  NA 137 137  K 3.9 3.4*  CL 99 104  CO2 24 21*  GLUCOSE 188* 149*  BUN 23 22  CREATININE 1.54* 1.82*  CALCIUM 9.1 7.7*   GFR: CrCl cannot be calculated (Unknown ideal weight.). Liver Function Tests: Recent Labs  Lab 04/14/19 1237  AST 32  ALT 30  ALKPHOS 82  BILITOT 2.1*  PROT 7.8  ALBUMIN 3.9   Recent Labs  Lab 04/14/19 1237  LIPASE 22   No results for input(s): AMMONIA in the last 168 hours. Coagulation Profile: Recent Labs  Lab 04/14/19 1237  INR 1.4*   Cardiac Enzymes: No results for input(s): CKTOTAL, CKMB, CKMBINDEX, TROPONINI in the last 168 hours. BNP (last 3 results) Recent Labs    01/29/19 0852  PROBNP 165.0*   HbA1C: Recent Labs    04/14/19 1237  HGBA1C 6.7*   CBG: Recent Labs  Lab 04/15/19 1645 04/15/19 1946 04/15/19 2325 04/16/19 0430 04/16/19 0728  GLUCAP 122* 136* 149* 145* 154*   Lipid Profile: No results for input(s): CHOL, HDL, LDLCALC, TRIG, CHOLHDL, LDLDIRECT in the last 72 hours. Thyroid Function Tests: No results for input(s): TSH, T4TOTAL, FREET4, T3FREE, THYROIDAB in the last 72 hours. Anemia  Panel: No results for input(s): VITAMINB12, FOLATE, FERRITIN, TIBC, IRON, RETICCTPCT in the last 72 hours. Sepsis Labs: Recent Labs  Lab 04/14/19 1230 04/14/19 1528  LATICACIDVEN 3.6* 2.1*    Recent Results (from the past 240 hour(s))  SARS Coronavirus 2 [facility] order, Performed in [facility] hospital lab) Nasopharyngeal Nasopharyngeal Swab     Status: None   Collection Time: 04/14/19 12:38 PM   Specimen: Nasopharyngeal Swab  Result Value Ref Range Status   SARS Coronavirus 2 NEGATIVE NEGATIVE Final    Comment: (NOTE) If result is NEGATIVE SARS-CoV-2 target nucleic acids are NOT DETECTED. The SARS-CoV-2 RNA is generally detectable in upper and lower  respiratory specimens during the acute phase of infection. The lowest  concentration of SARS-CoV-2 viral copies this assay can detect is 250  copies / mL. A negative result does not preclude SARS-CoV-2 infection  and should not be used as the sole basis for treatment or other  patient management decisions.  A negative result may occur with  improper specimen collection / handling, submission of specimen other  than nasopharyngeal swab, presence of viral mutation(s) within the  areas targeted by this assay, and inadequate number of viral copies  (<250 copies / mL). A negative result must be combined with clinical  observations, patient history, and epidemiological information. If result is POSITIVE SARS-CoV-2 target nucleic acids are DETECTED. The SARS-CoV-2 RNA is generally detectable in upper and lower  respiratory specimens dur ing the acute phase of infection.  Positive  results are indicative of active infection with SARS-CoV-2.  Clinical  correlation with patient history and other diagnostic information is  necessary to determine patient infection status.  Positive results do  not rule out bacterial infection or co-infection with other viruses. If result is PRESUMPTIVE POSTIVE SARS-CoV-2 nucleic acids MAY BE PRESENT.   A  presumptive positive result was obtained on the submitted specimen  and confirmed on repeat testing.  While 2019 novel coronavirus  (SARS-CoV-2) nucleic acids may be present in the submitted sample  additional confirmatory testing may be necessary for epidemiological  and / or clinical management purposes  to differentiate between  SARS-CoV-2 and other Sarbecovirus currently known to infect humans.  If clinically indicated additional testing with an alternate test  methodology [phone]) is advised. The SARS-CoV-2 RNA is generally  detectable in upper and lower respiratory sp ecimens during the acute  phase of infection. The expected result is Negative. Fact Sheet for Patients:  StrictlyIdeas.no Fact Sheet for Healthcare Providers: BankingDealers.co.za This test is not yet approved or cleared by the [place] FDA and has been authorized for detection and/or diagnosis of SARS-CoV-2 by FDA under an Emergency Use Authorization (EUA).  This EUA will remain in effect (meaning this test can be used) for the duration of the COVID-19 declaration under Section 564(b)(1) of the Act, 21 U.S.C. section 360bbb-3(b)(1), unless the authorization is terminated or revoked sooner. Performed at [facility], [place] [address], [place], [place] [address]          Radiology Studies: Dg Abd 1 View  Result Date: 04/16/2019 CLINICAL DATA:  Pneumoperitoneum EXAM: ABDOMEN -  1 VIEW COMPARISON:  06/06/2018, CT 04/14/2019 FINDINGS: Gaseous distension of the transverse colon, favor ileus. Prior cholecystectomy. No visible pneumoperitoneum as seen on prior CT. No organomegaly. Radiation seeds in the region of the prostate. IMPRESSION: No visible pneumoperitoneum on today's study. Electronically Signed   By: Rolm Baptise M.D.   On: 04/16/2019 08:38   Ct Abdomen Pelvis W Contrast  Result Date: 04/14/2019 CLINICAL DATA:  Abdominal pain for 5 days. History of  right hemicolectomy and lysis of adhesions. EXAM: CT ABDOMEN AND PELVIS WITH CONTRAST TECHNIQUE: Multidetector CT imaging of the abdomen and pelvis was performed using the standard protocol following bolus administration of intravenous contrast. CONTRAST:  49mL OMNIPAQUE IOHEXOL 300 MG/ML  SOLN COMPARISON:  06/03/2018 and chest radiograph of 03/18/2019 FINDINGS: Lower chest: Mild mitral and aortic valve calcifications. Hepatobiliary: Stable 1.3 by 0.8 cm hypodense lesion in segment 2 of the liver on image 15/3. Stable 0.4 cm hypodense lesion in the right hepatic lobe on image 22/3. Other small hypodense hepatic lesions likewise appear stable. Gallbladder surgically absent. Mild extrahepatic biliary prominence is likely a physiologic response to cholecystectomy. Pancreas: Unremarkable Spleen: Unremarkable Adrenals/Urinary Tract: Small hypodense bilateral renal lesions favor cysts but are technically too small to characterize. Small parapelvic cysts are present bilaterally. The adrenal glands appear normal. Stomach/Bowel: Right hemicolectomy, anastomotic staple line also noted in the sigmoid colon. There is extensive free intraperitoneal gas in the upper abdomen as well as scattered additional locules of free intraperitoneal gas along the peritoneum. The patient has a side-to-side ileocolostomy. The presumed blind-ending distal loop of ileum extends down into the right abdomen into an area of inflammation tracking along adjacent small bowel loops for example on image 50/3 and image 67/6. This region is a potential site for perforation given the local inflammatory findings. Scattered colonic diverticula are noted. Vascular/Lymphatic: Aortoiliac atherosclerotic vascular disease. Patent celiac trunk, superior mesenteric artery, and inferior mesenteric artery. No pathologic adenopathy is identified. Reproductive: Brachytherapy seed implants in the prostate gland. Other: In addition to the free intraperitoneal gas there is  abnormal inflammatory stranding in the right lower quadrant and central abdominal mesentery with a small amount of fluid tracking in this vicinity and along the adjacent omentum. Musculoskeletal: Small direct bilateral inguinal hernias. The right inguinal hernia contains a small amount of the edema tracking through the omentum. IMPRESSION: 1. Considerable free intraperitoneal gas. Exact site of perforation is uncertain but there appear to be inflammatory findings in the vicinity of the tip of the blind-ending loop of ileum from the side to side ileocolic anastomosis, and an adjacent loops of small bowel. Given that inflammation, this might be the site of perforation. The clinical team is aware of the free intraperitoneal gas based on prior radiography and their clinical notes. 2. Inflammatory edema in the mesentery and omentum particularly along the right and central abdomen. 3. Other imaging findings of potential clinical significance: Mild mitral and aortic valve calcifications. Small hypodense hepatic lesions are stable and probably benign. Aortic Atherosclerosis (ICD10-I70.0). Brachytherapy seeds in the prostate gland. Small bilateral direct inguinal hernias. Electronically Signed   By: Van Clines M.D.   On: 04/14/2019 15:39   Dg Chest Port 1 View  Addendum Date: 04/14/2019   ADDENDUM REPORT: 04/14/2019 12:10 ADDENDUM: These results were called by telephone at the time of interpretation on 04/14/2019 at 12:10 pm to provider [facility] , who verbally acknowledged these results. Electronically Signed   By: Zetta Bills M.D.   On: 04/14/2019 12:10   Result  Date: 04/14/2019 CLINICAL DATA:  Abdominal pain, question free air. EXAM: PORTABLE CHEST 1 VIEW COMPARISON:  06/03/2018 FINDINGS: Free air is noted beneath the right hemidiaphragm. Cardiomediastinal contours are normal. Lungs are clear. No signs of pleural effusion. No acute bone finding. IMPRESSION: Findings of free air in the abdomen. Further  evaluation with CT may be helpful. At the current time a call is out to the referring provider to discuss findings. Electronically Signed: By: Zetta Bills M.D. On: 04/14/2019 12:05        Scheduled Meds:  insulin aspart  0-15 Units Subcutaneous TID WC   insulin aspart  0-5 Units Subcutaneous QHS   Continuous Infusions:  sodium chloride 75 mL/hr at 04/16/19 0219   sodium chloride Stopped (04/14/19 1731)   piperacillin-tazobactam (ZOSYN)  IV 3.375 g (04/16/19 0218)     LOS: 2 days    Time spent: 68 min    Nicolette Bang, MD [name]  If 7PM-7AM, please contact night-coverage  04/16/2019, 9:53 AM

## 2019-04-17 DIAGNOSIS — Z85038 Personal history of other malignant neoplasm of large intestine: Secondary | ICD-10-CM

## 2019-04-17 LAB — CBC WITH DIFFERENTIAL/PLATELET
Abs Immature Granulocytes: 0.08 10*3/uL — ABNORMAL HIGH (ref 0.00–0.07)
Basophils Absolute: 0 10*3/uL (ref 0.0–0.1)
Basophils Relative: 0 %
Eosinophils Absolute: 0 10*3/uL (ref 0.0–0.5)
Eosinophils Relative: 1 %
HCT: 38.5 % — ABNORMAL LOW (ref 39.0–52.0)
Hemoglobin: 13.8 g/dL (ref 13.0–17.0)
Immature Granulocytes: 1 %
Lymphocytes Relative: 14 %
Lymphs Abs: 1.1 10*3/uL (ref 0.7–4.0)
MCH: 33.3 pg (ref 26.0–34.0)
MCHC: 35.8 g/dL (ref 30.0–36.0)
MCV: 92.8 fL (ref 80.0–100.0)
Monocytes Absolute: 0.5 10*3/uL (ref 0.1–1.0)
Monocytes Relative: 7 %
Neutro Abs: 6.5 10*3/uL (ref 1.7–7.7)
Neutrophils Relative %: 77 %
Platelets: 126 10*3/uL — ABNORMAL LOW (ref 150–400)
RBC: 4.15 MIL/uL — ABNORMAL LOW (ref 4.22–5.81)
RDW: 13.4 % (ref 11.5–15.5)
WBC: 8.3 10*3/uL (ref 4.0–10.5)
nRBC: 0 % (ref 0.0–0.2)

## 2019-04-17 LAB — GLUCOSE, CAPILLARY
Glucose-Capillary: 106 mg/dL — ABNORMAL HIGH (ref 70–99)
Glucose-Capillary: 120 mg/dL — ABNORMAL HIGH (ref 70–99)
Glucose-Capillary: 144 mg/dL — ABNORMAL HIGH (ref 70–99)
Glucose-Capillary: 161 mg/dL — ABNORMAL HIGH (ref 70–99)
Glucose-Capillary: 206 mg/dL — ABNORMAL HIGH (ref 70–99)
Glucose-Capillary: 70 mg/dL (ref 70–99)
Glucose-Capillary: 88 mg/dL (ref 70–99)

## 2019-04-17 MED ORDER — MEMANTINE HCL ER 28 MG PO CP24
28.0000 mg | ORAL_CAPSULE | Freq: Every day | ORAL | Status: DC
  Administered 2019-04-17 – 2019-04-20 (×4): 28 mg via ORAL
  Filled 2019-04-17 (×4): qty 1

## 2019-04-17 MED ORDER — METOPROLOL SUCCINATE ER 25 MG PO TB24
12.5000 mg | ORAL_TABLET | Freq: Every day | ORAL | Status: DC
  Administered 2019-04-17 – 2019-04-20 (×4): 12.5 mg via ORAL
  Filled 2019-04-17 (×4): qty 1

## 2019-04-17 NOTE — Progress Notes (Signed)
PT Cancellation Note  Patient Details Name: Calvin Burnett MRN: [id number] DOB: 10/27/1933   Cancelled Treatment:    Reason Eval/Treat Not Completed: Patient declined, no reason specified attempted to work with patient, he politely refuses due to not feeling well today, just very worn out and just now getting to eat. Reports he walked in his room by himself with the walker earlier and it went well, educated he does need to have staff at least present in room for safety with mobility.  Unable to convince him to participate this afternoon but willing to do PT tomorrow. Will check back on next day of service at this point.    Deniece Ree PT, DPT, CBIS  Supplemental Physical Therapist [facility]    Pager [phone] Acute Rehab Office [phone]

## 2019-04-17 NOTE — Progress Notes (Addendum)
PROGRESS NOTE  Calvin Burnett [id number] DOB: [birth year] DOA: 04/14/2019 PCP: Marin Olp, MD  HPI/Recap of past 24 hours: HPI from Dr Doristine Bosworth Concha Norway Turneris a 85 y.o.malewith medical history significant ofA. fib on Eliquis, hypertension, GERD, Alzheimer's dementia, T1N0 adenocarcinoma status post robotic LOA, right colectomy, sigmoidectomy on 11/26/2017 presents to emergency department with acute abdominal pain x 1 day. Patient admitted with similar symptoms on 06/03/2018 with free intra-abdominal air. He was treated nonoperatively with antibiotics and bowel rest and discharged home in stable condition.He had an follow-up colonoscopy on 7/20 which showed diverticulosis. In the ED, Chest x-ray obtained which showed air under diaphragm. EDP consulted general surgery for further evaluation and management.    Today, patient reported feeling better, denies any worsening abdominal pain, denies any nausea/vomiting, noted to have a temp of 100.5.   Assessment/Plan: Principal Problem:   Perforation bowel ([place]) Active Problems:   Essential hypertension   GERD (gastroesophageal reflux disease)   Type 2 diabetes mellitus ([name])   AF (paroxysmal atrial fibrillation) ([name])   Hyperlipidemia   History of prostate cancer   History of colon cancer   Dementia ([place])   Perforation of viscus   Pneumoperitoneum  Pneumoperitoneum of unclear etiology, likely bowel perforation Had similar episode in 05/2018, resolved without surgery Last temp 100.5 on 04/17/2019, with no leukocytosis Chest x-ray with free air under the diaphragm CT abdomen pelvis showed considerably free intraperitoneal gas, exact site of perforation is uncertain, likely around the ileum Repeat abdominal x-ray on 04/16/2019 shows no visible pneumoperitoneum General surgery on board-conservative management for now Continue clear liquid diet, IV Zosyn, IV fluids Continue to hold Eliquis for now Monitor closely   CKD stage III Creatinine stable, baseline 1.5-1.7 Continue IV fluids Daily BMP  Hypertension Stable Continue IV hydralazine PRN, hold home hydrochlorothiazide, amlodipine  Diabetes mellitus type 2 Last A1c 6.7 SSI, Accu-Cheks, hypoglycemic protocol Hold home metformin  Paroxysmal A. Fib Rate controlled Restart home metoprolol, continue to hold Eliquis  History of colon adenocarcinoma Status post right colectomy, sigmoidectomy on 11/2017  Alzheimer's dementia Continue memantine       Malnutrition Type:      Malnutrition Characteristics:      Nutrition Interventions:       Estimated body mass index is 26.79 kg/m as calculated from the following:   Height as of 02/25/19: 5\' 8"  (1.727 m).   Weight as of 02/25/19: 79.9 kg.     Code Status: Full  Family Communication: None at bedside  Disposition Plan: To be determined   Consultants:  General surgery  Procedures:  None  Antimicrobials:  IV Zosyn  DVT prophylaxis: SCDs   Objective: Vitals:   04/16/19 1315 04/16/19 2029 04/17/19 0428 04/17/19 1010  BP: (!) 140/97 129/87 (!) 135/97 128/81  Pulse: 91 (!) 108 82 93  Resp: 19 16 17 16   Temp: 98.3 F (36.8 C) 99.7 F (37.6 C) (!) 100.5 F (38.1 C) 98 F (36.7 C)  TempSrc: Oral Oral Oral Oral  SpO2: 98% 94% 95% 98%    Intake/Output Summary (Last 24 hours) at 04/17/2019 1028 Last data filed at 04/17/2019 0600 Gross per 24 hour  Intake 2628.23 ml  Output 250 ml  Net 2378.23 ml   There were no vitals filed for this visit.  Exam:  General: NAD   Cardiovascular: S1, S2 present  Respiratory: CTAB  Abdomen: Soft, mild generalized tenderness, nondistended, bowel sounds present  Musculoskeletal: No bilateral pedal edema noted  Skin: Normal  Psychiatry: Normal mood   Data Reviewed: CBC: Recent Labs  Lab 04/14/19 1237 04/15/19 0124 04/17/19 0407  WBC 11.2* 10.3 8.3  NEUTROABS 9.0*  --  6.5  HGB 17.5* 15.5 13.8  HCT 49.9  43.3 38.5*  MCV 94.9 92.9 92.8  PLT 128* 107* 123XX123*   Basic Metabolic Panel: Recent Labs  Lab 04/14/19 1237 04/15/19 0124 04/16/19 0838  NA 137 137 141  K 3.9 3.4* 3.4*  CL 99 104 108  CO2 24 21* 23  GLUCOSE 188* 149* 156*  BUN 23 22 28*  CREATININE 1.54* 1.82* 1.62*  CALCIUM 9.1 7.7* 7.3*   GFR: CrCl cannot be calculated (Unknown ideal weight.). Liver Function Tests: Recent Labs  Lab 04/14/19 1237  AST 32  ALT 30  ALKPHOS 82  BILITOT 2.1*  PROT 7.8  ALBUMIN 3.9   Recent Labs  Lab 04/14/19 1237  LIPASE 22   No results for input(s): AMMONIA in the last 168 hours. Coagulation Profile: Recent Labs  Lab 04/14/19 1237  INR 1.4*   Cardiac Enzymes: No results for input(s): CKTOTAL, CKMB, CKMBINDEX, TROPONINI in the last 168 hours. BNP (last 3 results) Recent Labs    01/29/19 0852  PROBNP 165.0*   HbA1C: Recent Labs    04/14/19 1237  HGBA1C 6.7*   CBG: Recent Labs  Lab 04/16/19 2027 04/16/19 2327 04/17/19 0011 04/17/19 0424 04/17/19 0851  GLUCAP 82 96 106* 120* 144*   Lipid Profile: No results for input(s): CHOL, HDL, LDLCALC, TRIG, CHOLHDL, LDLDIRECT in the last 72 hours. Thyroid Function Tests: No results for input(s): TSH, T4TOTAL, FREET4, T3FREE, THYROIDAB in the last 72 hours. Anemia Panel: No results for input(s): VITAMINB12, FOLATE, FERRITIN, TIBC, IRON, RETICCTPCT in the last 72 hours. Urine analysis:    Component Value Date/Time   COLORURINE YELLOW 04/14/2019 1650   APPEARANCEUR CLEAR 04/14/2019 1650   LABSPEC >1.046 (H) 04/14/2019 1650   PHURINE 5.0 04/14/2019 1650   GLUCOSEU NEGATIVE 04/14/2019 1650   HGBUR MODERATE (A) 04/14/2019 1650   HGBUR negative 07/15/2007 0757   BILIRUBINUR NEGATIVE 04/14/2019 1650   BILIRUBINUR neg 10/13/2014 1447   KETONESUR NEGATIVE 04/14/2019 1650   PROTEINUR 100 (A) 04/14/2019 1650   UROBILINOGEN 0.2 10/13/2014 1447   UROBILINOGEN 0.2 09/29/2014 1340   NITRITE NEGATIVE 04/14/2019 1650    LEUKOCYTESUR NEGATIVE 04/14/2019 1650   Sepsis Labs: @LABRCNTIP (procalcitonin:4,lacticidven:4)  ) Recent Results (from the past 240 hour(s))  SARS Coronavirus 2 [facility] order, Performed in [facility] hospital lab) Nasopharyngeal Nasopharyngeal Swab     Status: None   Collection Time: 04/14/19 12:38 PM   Specimen: Nasopharyngeal Swab  Result Value Ref Range Status   SARS Coronavirus 2 NEGATIVE NEGATIVE Final    Comment: (NOTE) If result is NEGATIVE SARS-CoV-2 target nucleic acids are NOT DETECTED. The SARS-CoV-2 RNA is generally detectable in upper and lower  respiratory specimens during the acute phase of infection. The lowest  concentration of SARS-CoV-2 viral copies this assay can detect is 250  copies / mL. A negative result does not preclude SARS-CoV-2 infection  and should not be used as the sole basis for treatment or other  patient management decisions.  A negative result may occur with  improper specimen collection / handling, submission of specimen other  than nasopharyngeal swab, presence of viral mutation(s) within the  areas targeted by this assay, and inadequate number of viral copies  (<250 copies / mL). A negative result must be combined with clinical  observations, patient history, and epidemiological information. If  result is POSITIVE SARS-CoV-2 target nucleic acids are DETECTED. The SARS-CoV-2 RNA is generally detectable in upper and lower  respiratory specimens dur ing the acute phase of infection.  Positive  results are indicative of active infection with SARS-CoV-2.  Clinical  correlation with patient history and other diagnostic information is  necessary to determine patient infection status.  Positive results do  not rule out bacterial infection or co-infection with other viruses. If result is PRESUMPTIVE POSTIVE SARS-CoV-2 nucleic acids MAY BE PRESENT.   A presumptive positive result was obtained on the submitted specimen  and confirmed on repeat  testing.  While 2019 novel coronavirus  (SARS-CoV-2) nucleic acids may be present in the submitted sample  additional confirmatory testing may be necessary for epidemiological  and / or clinical management purposes  to differentiate between  SARS-CoV-2 and other Sarbecovirus currently known to infect humans.  If clinically indicated additional testing with an alternate test  methodology [phone]) is advised. The SARS-CoV-2 RNA is generally  detectable in upper and lower respiratory sp ecimens during the acute  phase of infection. The expected result is Negative. Fact Sheet for Patients:  StrictlyIdeas.no Fact Sheet for Healthcare Providers: BankingDealers.co.za This test is not yet approved or cleared by the [place] FDA and has been authorized for detection and/or diagnosis of SARS-CoV-2 by FDA under an Emergency Use Authorization (EUA).  This EUA will remain in effect (meaning this test can be used) for the duration of the COVID-19 declaration under Section 564(b)(1) of the Act, 21 U.S.C. section 360bbb-3(b)(1), unless the authorization is terminated or revoked sooner. Performed at [facility], [place] [address], [place], [place] [address]       Studies: No results found.  Scheduled Meds: . insulin aspart  0-15 Units Subcutaneous TID WC  . insulin aspart  0-5 Units Subcutaneous QHS    Continuous Infusions: . sodium chloride 75 mL/hr at 04/17/19 0600  . sodium chloride Stopped (04/14/19 1731)  . piperacillin-tazobactam (ZOSYN)  IV 3.375 g (04/17/19 0947)     LOS: 3 days     Alma Friendly, MD [name]  If 7PM-7AM, please contact night-coverage [email or web address] 04/17/2019, 10:28 AM

## 2019-04-18 LAB — BASIC METABOLIC PANEL
Anion gap: 11 (ref 5–15)
BUN: 15 mg/dL (ref 8–23)
CO2: 20 mmol/L — ABNORMAL LOW (ref 22–32)
Calcium: 7.1 mg/dL — ABNORMAL LOW (ref 8.9–10.3)
Chloride: 105 mmol/L (ref 98–111)
Creatinine, Ser: 1.36 mg/dL — ABNORMAL HIGH (ref 0.61–1.24)
GFR calc Af Amer: 55 mL/min — ABNORMAL LOW (ref >60)
GFR calc non Af Amer: 48 mL/min — ABNORMAL LOW (ref >60)
Glucose, Bld: 115 mg/dL — ABNORMAL HIGH (ref 70–99)
Potassium: 2.9 mmol/L — ABNORMAL LOW (ref 3.5–5.1)
Sodium: 136 mmol/L (ref 135–145)

## 2019-04-18 LAB — CBC WITH DIFFERENTIAL/PLATELET
Abs Immature Granulocytes: 0.09 10*3/uL — ABNORMAL HIGH (ref 0.00–0.07)
Basophils Absolute: 0 10*3/uL (ref 0.0–0.1)
Basophils Relative: 0 %
Eosinophils Absolute: 0.2 10*3/uL (ref 0.0–0.5)
Eosinophils Relative: 2 %
HCT: 36.7 % — ABNORMAL LOW (ref 39.0–52.0)
Hemoglobin: 13.3 g/dL (ref 13.0–17.0)
Immature Granulocytes: 1 %
Lymphocytes Relative: 12 %
Lymphs Abs: 1.1 10*3/uL (ref 0.7–4.0)
MCH: 33.3 pg (ref 26.0–34.0)
MCHC: 36.2 g/dL — ABNORMAL HIGH (ref 30.0–36.0)
MCV: 92 fL (ref 80.0–100.0)
Monocytes Absolute: 0.7 10*3/uL (ref 0.1–1.0)
Monocytes Relative: 8 %
Neutro Abs: 7 10*3/uL (ref 1.7–7.7)
Neutrophils Relative %: 77 %
Platelets: 147 10*3/uL — ABNORMAL LOW (ref 150–400)
RBC: 3.99 MIL/uL — ABNORMAL LOW (ref 4.22–5.81)
RDW: 13.5 % (ref 11.5–15.5)
WBC: 9 10*3/uL (ref 4.0–10.5)
nRBC: 0 % (ref 0.0–0.2)

## 2019-04-18 LAB — GLUCOSE, CAPILLARY
Glucose-Capillary: 116 mg/dL — ABNORMAL HIGH (ref 70–99)
Glucose-Capillary: 127 mg/dL — ABNORMAL HIGH (ref 70–99)
Glucose-Capillary: 156 mg/dL — ABNORMAL HIGH (ref 70–99)
Glucose-Capillary: 181 mg/dL — ABNORMAL HIGH (ref 70–99)
Glucose-Capillary: 99 mg/dL (ref 70–99)

## 2019-04-18 MED ORDER — POTASSIUM CHLORIDE CRYS ER 20 MEQ PO TBCR
40.0000 meq | EXTENDED_RELEASE_TABLET | Freq: Two times a day (BID) | ORAL | Status: AC
  Administered 2019-04-18 (×2): 40 meq via ORAL
  Filled 2019-04-18 (×2): qty 2

## 2019-04-18 MED ORDER — INFLUENZA VAC A&B SA ADJ QUAD 0.5 ML IM PRSY
0.5000 mL | PREFILLED_SYRINGE | INTRAMUSCULAR | Status: AC
  Administered 2019-04-19: 0.5 mL via INTRAMUSCULAR
  Filled 2019-04-18: qty 0.5

## 2019-04-18 NOTE — Progress Notes (Signed)
Patient ID: Calvin Burnett, male   DOB: Apr 27, 1934, 85 y.o.   MRN: [id number] [facility] Surgery Progress Note:   * No surgery found *  Subjective: Mental status is pleasant;  Not complaining of pain Objective: Vital signs in last 24 hours: Temp:  [97.5 F (36.4 C)-99.6 F (37.6 C)] 97.5 F (36.4 C) (10/04 0604) Pulse Rate:  [68-80] 80 (10/04 0604) Resp:  [15-17] 15 (10/04 0604) BP: (119-126)/(81-93) 126/93 (10/04 0604) SpO2:  [96 %-97 %] 97 % (10/04 0604) Weight:  [80.1 kg] 80.1 kg (10/04 0800)  Intake/Output from previous day: 10/03 0701 - 10/04 0700 In: 3260.5 [P.O.:1160; I.V.:1847.6; IV Piggyback:252.9] Out: -  Intake/Output this shift: No intake/output data recorded.  Physical Exam: Work of breathing is not labored.  Abdomen not appearing to be tender.  Lab Results:  Results for orders placed or performed during the hospital encounter of 04/14/19 (from the past 48 hour(s))  Glucose, capillary     Status: Abnormal   Collection Time: 04/16/19 11:36 AM  Result Value Ref Range   Glucose-Capillary 154 (H) 70 - 99 mg/dL  Glucose, capillary     Status: Abnormal   Collection Time: 04/16/19  4:26 PM  Result Value Ref Range   Glucose-Capillary 171 (H) 70 - 99 mg/dL  Glucose, capillary     Status: None   Collection Time: 04/16/19  8:27 PM  Result Value Ref Range   Glucose-Capillary 82 70 - 99 mg/dL  Glucose, capillary     Status: None   Collection Time: 04/16/19 11:27 PM  Result Value Ref Range   Glucose-Capillary 96 70 - 99 mg/dL  Glucose, capillary     Status: Abnormal   Collection Time: 04/17/19 12:11 AM  Result Value Ref Range   Glucose-Capillary 106 (H) 70 - 99 mg/dL  CBC with Differential/Platelet     Status: Abnormal   Collection Time: 04/17/19  4:07 AM  Result Value Ref Range   WBC 8.3 4.0 - 10.5 K/uL   RBC 4.15 (L) 4.22 - 5.81 MIL/uL   Hemoglobin 13.8 13.0 - 17.0 g/dL   HCT 38.5 (L) 39.0 - 52.0 %   MCV 92.8 80.0 - 100.0 fL   MCH 33.3 26.0 - 34.0 pg   MCHC 35.8 30.0 - 36.0 g/dL   RDW 13.4 11.5 - 15.5 %   Platelets 126 (L) 150 - 400 K/uL   nRBC 0.0 0.0 - 0.2 %   Neutrophils Relative % 77 %   Neutro Abs 6.5 1.7 - 7.7 K/uL   Lymphocytes Relative 14 %   Lymphs Abs 1.1 0.7 - 4.0 K/uL   Monocytes Relative 7 %   Monocytes Absolute 0.5 0.1 - 1.0 K/uL   Eosinophils Relative 1 %   Eosinophils Absolute 0.0 0.0 - 0.5 K/uL   Basophils Relative 0 %   Basophils Absolute 0.0 0.0 - 0.1 K/uL   Immature Granulocytes 1 %   Abs Immature Granulocytes 0.08 (H) 0.00 - 0.07 K/uL    Comment: Performed at [facility], [address], [place], [place] [address]  Glucose, capillary     Status: Abnormal   Collection Time: 04/17/19  4:24 AM  Result Value Ref Range   Glucose-Capillary 120 (H) 70 - 99 mg/dL  Glucose, capillary     Status: Abnormal   Collection Time: 04/17/19  8:51 AM  Result Value Ref Range   Glucose-Capillary 144 (H) 70 - 99 mg/dL  Glucose, capillary     Status: Abnormal   Collection Time:  04/17/19 11:50 AM  Result Value Ref Range   Glucose-Capillary 206 (H) 70 - 99 mg/dL  Glucose, capillary     Status: Abnormal   Collection Time: 04/17/19  5:38 PM  Result Value Ref Range   Glucose-Capillary 161 (H) 70 - 99 mg/dL  Glucose, capillary     Status: None   Collection Time: 04/17/19  8:34 PM  Result Value Ref Range   Glucose-Capillary 88 70 - 99 mg/dL  Glucose, capillary     Status: None   Collection Time: 04/17/19  9:56 PM  Result Value Ref Range   Glucose-Capillary 70 70 - 99 mg/dL  Glucose, capillary     Status: None   Collection Time: 04/18/19 12:29 AM  Result Value Ref Range   Glucose-Capillary 99 70 - 99 mg/dL  CBC with Differential/Platelet     Status: Abnormal   Collection Time: 04/18/19  4:30 AM  Result Value Ref Range   WBC 9.0 4.0 - 10.5 K/uL   RBC 3.99 (L) 4.22 - 5.81 MIL/uL   Hemoglobin 13.3 13.0 - 17.0 g/dL   HCT 36.7 (L) 39.0 - 52.0 %   MCV 92.0 80.0 - 100.0 fL   MCH 33.3 26.0 - 34.0 pg   MCHC 36.2 (H) 30.0 -  36.0 g/dL   RDW 13.5 11.5 - 15.5 %   Platelets 147 (L) 150 - 400 K/uL   nRBC 0.0 0.0 - 0.2 %   Neutrophils Relative % 77 %   Neutro Abs 7.0 1.7 - 7.7 K/uL   Lymphocytes Relative 12 %   Lymphs Abs 1.1 0.7 - 4.0 K/uL   Monocytes Relative 8 %   Monocytes Absolute 0.7 0.1 - 1.0 K/uL   Eosinophils Relative 2 %   Eosinophils Absolute 0.2 0.0 - 0.5 K/uL   Basophils Relative 0 %   Basophils Absolute 0.0 0.0 - 0.1 K/uL   Immature Granulocytes 1 %   Abs Immature Granulocytes 0.09 (H) 0.00 - 0.07 K/uL    Comment: Performed at [facility], [address] [place], [place] [id number]  Basic metabolic panel     Status: Abnormal   Collection Time: 04/18/19  4:30 AM  Result Value Ref Range   Sodium 136 135 - 145 mmol/L   Potassium 2.9 (L) 3.5 - 5.1 mmol/L   Chloride 105 98 - 111 mmol/L   CO2 20 (L) 22 - 32 mmol/L   Glucose, Bld 115 (H) 70 - 99 mg/dL   BUN 15 8 - 23 mg/dL   Creatinine, Ser 1.36 (H) 0.61 - 1.24 mg/dL   Calcium 7.1 (L) 8.9 - 10.3 mg/dL   GFR calc non Af Amer 48 (L) >60 mL/min   GFR calc Af Amer 55 (L) >60 mL/min   Anion gap 11 5 - 15    Comment: Performed at [facility] [address], [place], [place] [address]  Glucose, capillary     Status: Abnormal   Collection Time: 04/18/19  7:56 AM  Result Value Ref Range   Glucose-Capillary 116 (H) 70 - 99 mg/dL    Radiology/Results: No results found.  Anti-infectives: Anti-infectives (From admission, onward)   Start     Dose/Rate Route Frequency Ordered Stop   04/14/19 1830  piperacillin-tazobactam (ZOSYN) IVPB 3.375 g     3.375 g 12.5 mL/hr over 240 Minutes Intravenous Every 8 hours 04/14/19 1343     04/14/19 1345  piperacillin-tazobactam (ZOSYN) IVPB 3.375 g  Status:  Discontinued     3.375 g  100 mL/hr over 30 Minutes Intravenous Every 6 hours 04/14/19 1334 04/14/19 1337   04/14/19 1145  piperacillin-tazobactam (ZOSYN) IVPB 3.375 g     3.375 g 100 mL/hr over 30 Minutes Intravenous  Once 04/14/19 1133 04/14/19  1303      Assessment/Plan: Problem List: Patient Active Problem List   Diagnosis Date Noted  . Perforation of viscus   . Pneumoperitoneum   . Perforation bowel ([place]) 04/14/2019  . Hyperlipidemia 04/14/2019  . History of prostate cancer 04/14/2019  . History of colon cancer 04/14/2019  . Dementia ([place]) 04/14/2019  . Aortic atherosclerosis ([place]) 01/29/2019  . Pneumoperitoneum of unknown etiology 06/03/2018  . B12 deficiency 12/04/2017  . Stricture of sigmoid s/p robotic sigmoidectomy 11/26/2017 11/26/2017  . Chronic anticoagulation 11/26/2017  . pT1pN0 colon cancer s/p robotic right colectomy 11/26/2017 10/02/2017  . Right inguinal hernia 10/02/2017  . AF (paroxysmal atrial fibrillation) ([place]) 04/09/2017  . Night sweats 09/16/2016  . Major depressive disorder with single episode, in full remission ([place]) 07/29/2016  . History of adenomatous polyp of colon 10/27/2014  . Bowel habit changes 10/14/2014  . Rectal pain 10/14/2014  . Syncope 09/27/2014  . Bradycardia 09/27/2014  . CKD (chronic kidney disease), stage III ([place]) 06/01/2014  . Hyperlipidemia associated with type 2 diabetes mellitus ([place]) 03/01/2014  . Prostate cancer ([place]) 08/12/2012  . Type 2 diabetes mellitus ([place]) 07/31/2010  . LEG CRAMPS, NOCTURNAL 04/30/2010  . Basal cell carcinoma 05/03/2009  . Vascular dementia ([place]) 07/27/2008  . Cervical spondylosis without myelopathy 12/11/2007  . GERD (gastroesophageal reflux disease) 11/05/2007  . Essential hypertension 01/30/2007    Appears to be improving --according to the patient.  Abdomen is nontender today.   * No surgery found *    LOS: 4 days   Matt B. Hassell Done, MD, [facility] [phone] beeper [phone]  04/18/2019 10:10 AM

## 2019-04-18 NOTE — Evaluation (Signed)
Physical Therapy Evaluation Patient Details Name: Calvin Burnett MRN: [id number] DOB: [birth year] Today's Date: 04/18/2019   History of Present Illness  Patient is a 85 y/o male who presents with abdominal pain. Found to have bowel peforation.  PMH includes DM, HTN, dementia, T1N0 adenocarcinoma status post robotic LOA, right colectomy, sigmoidectomy on 11/26/2017, A-fib.  Clinical Impression  Patient presents with generalized weakness and impaired balance s/p above. Pt independent PTA, lives alone and walks 3 miles per day with a friend. Today, pt tolerated transfers and gait training with min guard assist-supervision for safety requiring use of RW. Normally pt ambulatory without DME. Encouraged walking 3 more times to day. Anticipate, mobility and strength will improve quickly with increased activity. Will follow acutely to maximize independence and mobility prior to return home.    Follow Up Recommendations No PT follow up;Supervision - Intermittent    Equipment Recommendations  None recommended by PT    Recommendations for Other Services       Precautions / Restrictions Precautions Precautions: Fall Restrictions Weight Bearing Restrictions: No      Mobility  Bed Mobility Overal bed mobility: Modified Independent             General bed mobility comments: HOB elevated, use of rails.  Transfers Overall transfer level: Needs assistance Equipment used: Rolling walker (2 wheeled) Transfers: Sit to/from Stand Sit to Stand: Min guard         General transfer comment: Min guard for safety. Stood from [employer], transferred to chair post ambulation.  Ambulation/Gait Ambulation/Gait assistance: Min guard Gait Distance (Feet): 500 Feet Assistive device: Rolling walker (2 wheeled) Gait Pattern/deviations: Step-through pattern;Decreased stride length Gait velocity: decreased   General Gait Details: Slow, steady gait with RW for support. HR stable. Declined walking wtihout RW  even though this is baseline-good safety awareness.  Stairs            Wheelchair Mobility    Modified Rankin (Stroke Patients Only)       Balance Overall balance assessment: Mild deficits observed, not formally tested                                           Pertinent Vitals/Pain Pain Assessment: No/denies pain    Home Living Family/patient expects to be discharged to:: Private residence Living Arrangements: Alone Available Help at Discharge: Family;Available PRN/intermittently(daighter in [place] and son 10 miles away) Type of Home: House Home Access: Stairs to enter Entrance Stairs-Rails: None Entrance Stairs-Number of Steps: 1 Home Layout: One level Home Equipment: Environmental consultant - 2 wheels;Cane - single point      Prior Function Level of Independence: Independent         Comments: Walks 3 miles per day with a friend, takes ~35-40 minutes.     Hand Dominance   Dominant Hand: Right    Extremity/Trunk Assessment   Upper Extremity Assessment Upper Extremity Assessment: Defer to OT evaluation    Lower Extremity Assessment Lower Extremity Assessment: Generalized weakness    Cervical / Trunk Assessment Cervical / Trunk Assessment: Kyphotic  Communication   Communication: HOH  Cognition Arousal/Alertness: Awake/alert Behavior During Therapy: WFL for tasks assessed/performed Overall Cognitive Status: History of cognitive impairments - at baseline  General Comments: Does not know month/date.      General Comments      Exercises     Assessment/Plan    PT Assessment Patient needs continued PT services  PT Problem List Decreased strength;Decreased cognition       PT Treatment Interventions Gait training;Stair training;Balance training;Therapeutic exercise;Patient/family education;Therapeutic activities    PT Goals (Current goals can be found in the Care Plan section)  Acute Rehab  PT Goals Patient Stated Goal: to get better and go home PT Goal Formulation: With patient Time For Goal Achievement: 05/02/19 Potential to Achieve Goals: Good    Frequency Min 3X/week   Barriers to discharge Decreased caregiver support      Co-evaluation               AM-PAC PT "6 Clicks" Mobility  Outcome Measure Help needed turning from your back to your side while in a flat bed without using bedrails?: A Little Help needed moving from lying on your back to sitting on the side of a flat bed without using bedrails?: A Little Help needed moving to and from a bed to a chair (including a wheelchair)?: A Little Help needed standing up from a chair using your arms (e.g., wheelchair or bedside chair)?: A Little Help needed to walk in hospital room?: A Little Help needed climbing 3-5 steps with a railing? : A Little 6 Click Score: 18    End of Session Equipment Utilized During Treatment: Gait belt Activity Tolerance: Patient tolerated treatment well Patient left: in chair;with call bell/phone within reach;with chair alarm set Nurse Communication: Mobility status PT Visit Diagnosis: Muscle weakness (generalized) (M62.81)    Time: [id number] PT Time Calculation (min) (ACUTE ONLY): 22 min   Charges:   PT Evaluation $PT Eval Moderate Complexity: 1 Mod          Calvin Burnett, PT, DPT Acute Rehabilitation Services Pager [phone] Office [phone]      Marguarite Arbour A Sabra Heck 04/18/2019, 9:38 AM

## 2019-04-18 NOTE — Progress Notes (Signed)
PROGRESS NOTE  Calvin Burnett [id number] DOB: [birth year] DOA: 04/14/2019 PCP: Marin Olp, MD  HPI/Recap of past 24 hours: HPI from Dr Doristine Bosworth Concha Norway Turneris a 85 y.o.malewith medical history significant ofA. fib on Eliquis, hypertension, GERD, Alzheimer's dementia, T1N0 adenocarcinoma status post robotic LOA, right colectomy, sigmoidectomy on 11/26/2017 presents to emergency department with acute abdominal pain x 1 day. Patient admitted with similar symptoms on 06/03/2018 with free intra-abdominal air. He was treated nonoperatively with antibiotics and bowel rest and discharged home in stable condition.He had an follow-up colonoscopy on 7/20 which showed diverticulosis. In the ED, Chest x-ray obtained which showed air under diaphragm. EDP consulted general surgery for further evaluation and management.    Today, patient denies any new complaints.  Was able to tolerate his clear liquid diet for 2 days now   Assessment/Plan: Principal Problem:   Perforation bowel ([place]) Active Problems:   Essential hypertension   GERD (gastroesophageal reflux disease)   Type 2 diabetes mellitus ([name])   AF (paroxysmal atrial fibrillation) ([name])   Hyperlipidemia   History of prostate cancer   History of colon cancer   Dementia ([place])   Perforation of viscus   Pneumoperitoneum  Pneumoperitoneum of unclear etiology, likely bowel perforation Had similar episode in 05/2018, resolved without surgery Last temp 100.5 on 04/17/2019, with no leukocytosis Chest x-ray with free air under the diaphragm CT abdomen pelvis showed considerably free intraperitoneal gas, exact site of perforation is uncertain, likely around the ileum Repeat abdominal x-ray on 04/16/2019 shows no visible pneumoperitoneum General surgery on board-conservative management for now Advance to soft diet, IV Zosyn, IV fluids Continue to hold Eliquis for now Monitor closely  CKD stage III Creatinine stable, baseline  1.5-1.7 D/C IV fluids Daily BMP  Hypertension Stable Continue IV hydralazine PRN, hold home hydrochlorothiazide, amlodipine  Diabetes mellitus type 2 Last A1c 6.7 SSI, Accu-Cheks, hypoglycemic protocol Hold home metformin  Paroxysmal A. Fib Rate controlled Restart home metoprolol, continue to hold Eliquis  History of colon adenocarcinoma Status post right colectomy, sigmoidectomy on 11/2017  Alzheimer's dementia Continue memantine       Malnutrition Type:      Malnutrition Characteristics:      Nutrition Interventions:       Estimated body mass index is 25.34 kg/m as calculated from the following:   Height as of this encounter: 5\' 10"  (1.778 m).   Weight as of this encounter: 80.1 kg.     Code Status: Full  Family Communication: None at bedside  Disposition Plan: To be determined   Consultants:  General surgery  Procedures:  None  Antimicrobials:  IV Zosyn  DVT prophylaxis: SCDs   Objective: Vitals:   04/17/19 2159 04/18/19 0604 04/18/19 0800 04/18/19 1529  BP: 119/81 (!) 126/93  118/72  Pulse: 68 80  90  Resp: 17 15  14   Temp: 99.6 F (37.6 C) (!) 97.5 F (36.4 C)  99.4 F (37.4 C)  TempSrc: Oral Oral  Oral  SpO2: 96% 97%  99%  Weight:   80.1 kg   Height:   5\' 10"  (1.778 m)     Intake/Output Summary (Last 24 hours) at 04/18/2019 1620 Last data filed at 04/18/2019 1300 Gross per 24 hour  Intake 3110.47 ml  Output -  Net 3110.47 ml   Filed Weights   04/18/19 0800  Weight: 80.1 kg    Exam:  General: NAD   Cardiovascular: S1, S2 present  Respiratory: CTAB  Abdomen: Soft, nontender, nondistended, bowel  sounds present  Musculoskeletal: No bilateral pedal edema noted  Skin: Normal  Psychiatry: Normal mood   Data Reviewed: CBC: Recent Labs  Lab 04/14/19 1237 04/15/19 0124 04/17/19 0407 04/18/19 0430  WBC 11.2* 10.3 8.3 9.0  NEUTROABS 9.0*  --  6.5 7.0  HGB 17.5* 15.5 13.8 13.3  HCT 49.9 43.3 38.5*  36.7*  MCV 94.9 92.9 92.8 92.0  PLT 128* 107* 126* [id number]*   Basic Metabolic Panel: Recent Labs  Lab 04/14/19 1237 04/15/19 0124 04/16/19 0838 04/18/19 0430  NA 137 137 141 136  K 3.9 3.4* 3.4* 2.9*  CL 99 104 108 105  CO2 24 21* 23 20*  GLUCOSE 188* 149* 156* 115*  BUN 23 22 28* 15  CREATININE 1.54* 1.82* 1.62* 1.36*  CALCIUM 9.1 7.7* 7.3* 7.1*   GFR: Estimated Creatinine Clearance: 42.5 mL/min (A) (by C-G formula based on SCr of 1.36 mg/dL (H)). Liver Function Tests: Recent Labs  Lab 04/14/19 1237  AST 32  ALT 30  ALKPHOS 82  BILITOT 2.1*  PROT 7.8  ALBUMIN 3.9   Recent Labs  Lab 04/14/19 1237  LIPASE 22   No results for input(s): AMMONIA in the last 168 hours. Coagulation Profile: Recent Labs  Lab 04/14/19 1237  INR 1.4*   Cardiac Enzymes: No results for input(s): CKTOTAL, CKMB, CKMBINDEX, TROPONINI in the last 168 hours. BNP (last 3 results) Recent Labs    01/29/19 0852  PROBNP 165.0*   HbA1C: No results for input(s): HGBA1C in the last 72 hours. CBG: Recent Labs  Lab 04/17/19 2034 04/17/19 2156 04/18/19 0029 04/18/19 0756 04/18/19 1157  GLUCAP 88 70 99 116* 181*   Lipid Profile: No results for input(s): CHOL, HDL, LDLCALC, TRIG, CHOLHDL, LDLDIRECT in the last 72 hours. Thyroid Function Tests: No results for input(s): TSH, T4TOTAL, FREET4, T3FREE, THYROIDAB in the last 72 hours. Anemia Panel: No results for input(s): VITAMINB12, FOLATE, FERRITIN, TIBC, IRON, RETICCTPCT in the last 72 hours. Urine analysis:    Component Value Date/Time   COLORURINE YELLOW 04/14/2019 1650   APPEARANCEUR CLEAR 04/14/2019 1650   LABSPEC >1.046 (H) 04/14/2019 1650   PHURINE 5.0 04/14/2019 1650   GLUCOSEU NEGATIVE 04/14/2019 1650   HGBUR MODERATE (A) 04/14/2019 1650   HGBUR negative 07/15/2007 0757   BILIRUBINUR NEGATIVE 04/14/2019 1650   BILIRUBINUR neg 10/13/2014 1447   KETONESUR NEGATIVE 04/14/2019 1650   PROTEINUR 100 (A) 04/14/2019 1650    UROBILINOGEN 0.2 10/13/2014 1447   UROBILINOGEN 0.2 09/29/2014 1340   NITRITE NEGATIVE 04/14/2019 1650   LEUKOCYTESUR NEGATIVE 04/14/2019 1650   Sepsis Labs: @LABRCNTIP (procalcitonin:4,lacticidven:4)  ) Recent Results (from the past 240 hour(s))  SARS Coronavirus 2 [facility] order, Performed in [facility] hospital lab) Nasopharyngeal Nasopharyngeal Swab     Status: None   Collection Time: 04/14/19 12:38 PM   Specimen: Nasopharyngeal Swab  Result Value Ref Range Status   SARS Coronavirus 2 NEGATIVE NEGATIVE Final    Comment: (NOTE) If result is NEGATIVE SARS-CoV-2 target nucleic acids are NOT DETECTED. The SARS-CoV-2 RNA is generally detectable in upper and lower  respiratory specimens during the acute phase of infection. The lowest  concentration of SARS-CoV-2 viral copies this assay can detect is 250  copies / mL. A negative result does not preclude SARS-CoV-2 infection  and should not be used as the sole basis for treatment or other  patient management decisions.  A negative result may occur with  improper specimen collection / handling, submission of specimen other  than nasopharyngeal swab, presence  of viral mutation(s) within the  areas targeted by this assay, and inadequate number of viral copies  (<250 copies / mL). A negative result must be combined with clinical  observations, patient history, and epidemiological information. If result is POSITIVE SARS-CoV-2 target nucleic acids are DETECTED. The SARS-CoV-2 RNA is generally detectable in upper and lower  respiratory specimens dur ing the acute phase of infection.  Positive  results are indicative of active infection with SARS-CoV-2.  Clinical  correlation with patient history and other diagnostic information is  necessary to determine patient infection status.  Positive results do  not rule out bacterial infection or co-infection with other viruses. If result is PRESUMPTIVE POSTIVE SARS-CoV-2 nucleic acids MAY BE  PRESENT.   A presumptive positive result was obtained on the submitted specimen  and confirmed on repeat testing.  While 2019 novel coronavirus  (SARS-CoV-2) nucleic acids may be present in the submitted sample  additional confirmatory testing may be necessary for epidemiological  and / or clinical management purposes  to differentiate between  SARS-CoV-2 and other Sarbecovirus currently known to infect humans.  If clinically indicated additional testing with an alternate test  methodology [phone]) is advised. The SARS-CoV-2 RNA is generally  detectable in upper and lower respiratory sp ecimens during the acute  phase of infection. The expected result is Negative. Fact Sheet for Patients:  StrictlyIdeas.no Fact Sheet for Healthcare Providers: BankingDealers.co.za This test is not yet approved or cleared by the [place] FDA and has been authorized for detection and/or diagnosis of SARS-CoV-2 by FDA under an Emergency Use Authorization (EUA).  This EUA will remain in effect (meaning this test can be used) for the duration of the COVID-19 declaration under Section 564(b)(1) of the Act, 21 U.S.C. section 360bbb-3(b)(1), unless the authorization is terminated or revoked sooner. Performed at [facility], [place] [address] [place], [place] [address]       Studies: No results found.  Scheduled Meds: . insulin aspart  0-15 Units Subcutaneous TID WC  . insulin aspart  0-5 Units Subcutaneous QHS  . memantine  28 mg Oral Daily  . metoprolol succinate  12.5 mg Oral Daily  . potassium chloride  40 mEq Oral BID    Continuous Infusions: . sodium chloride 75 mL/hr at 04/18/19 0728  . sodium chloride 250 mL (04/18/19 0225)  . piperacillin-tazobactam (ZOSYN)  IV 3.375 g (04/18/19 1050)     LOS: 4 days     Alma Friendly, MD [name]  If 7PM-7AM, please contact night-coverage [email or web address] 04/18/2019, 4:20 PM

## 2019-04-19 LAB — CBC WITH DIFFERENTIAL/PLATELET
Abs Immature Granulocytes: 0.17 10*3/uL — ABNORMAL HIGH (ref 0.00–0.07)
Basophils Absolute: 0 10*3/uL (ref 0.0–0.1)
Basophils Relative: 1 %
Eosinophils Absolute: 0.6 10*3/uL — ABNORMAL HIGH (ref 0.0–0.5)
Eosinophils Relative: 7 %
HCT: 36.3 % — ABNORMAL LOW (ref 39.0–52.0)
Hemoglobin: 12.9 g/dL — ABNORMAL LOW (ref 13.0–17.0)
Immature Granulocytes: 2 %
Lymphocytes Relative: 12 %
Lymphs Abs: 1 10*3/uL (ref 0.7–4.0)
MCH: 32.7 pg (ref 26.0–34.0)
MCHC: 35.5 g/dL (ref 30.0–36.0)
MCV: 91.9 fL (ref 80.0–100.0)
Monocytes Absolute: 0.7 10*3/uL (ref 0.1–1.0)
Monocytes Relative: 8 %
Neutro Abs: 6.3 10*3/uL (ref 1.7–7.7)
Neutrophils Relative %: 70 %
Platelets: 172 10*3/uL (ref 150–400)
RBC: 3.95 MIL/uL — ABNORMAL LOW (ref 4.22–5.81)
RDW: 13.6 % (ref 11.5–15.5)
WBC: 8.8 10*3/uL (ref 4.0–10.5)
nRBC: 0 % (ref 0.0–0.2)

## 2019-04-19 LAB — BASIC METABOLIC PANEL
Anion gap: 9 (ref 5–15)
BUN: 11 mg/dL (ref 8–23)
CO2: 21 mmol/L — ABNORMAL LOW (ref 22–32)
Calcium: 7.6 mg/dL — ABNORMAL LOW (ref 8.9–10.3)
Chloride: 107 mmol/L (ref 98–111)
Creatinine, Ser: 1.28 mg/dL — ABNORMAL HIGH (ref 0.61–1.24)
GFR calc Af Amer: 60 mL/min — ABNORMAL LOW (ref >60)
GFR calc non Af Amer: 51 mL/min — ABNORMAL LOW (ref >60)
Glucose, Bld: 130 mg/dL — ABNORMAL HIGH (ref 70–99)
Potassium: 3.3 mmol/L — ABNORMAL LOW (ref 3.5–5.1)
Sodium: 137 mmol/L (ref 135–145)

## 2019-04-19 LAB — GLUCOSE, CAPILLARY
Glucose-Capillary: 128 mg/dL — ABNORMAL HIGH (ref 70–99)
Glucose-Capillary: 203 mg/dL — ABNORMAL HIGH (ref 70–99)
Glucose-Capillary: 118 mg/dL — ABNORMAL HIGH (ref 70–99)
Glucose-Capillary: 143 mg/dL — ABNORMAL HIGH (ref 70–99)

## 2019-04-19 MED ORDER — POTASSIUM CHLORIDE CRYS ER 20 MEQ PO TBCR
40.0000 meq | EXTENDED_RELEASE_TABLET | Freq: Once | ORAL | Status: AC
  Administered 2019-04-19: 09:00:00 40 meq via ORAL
  Filled 2019-04-19: qty 2

## 2019-04-19 NOTE — Progress Notes (Signed)
PROGRESS NOTE  Calvin Burnett [id number] DOB: 16-Nov-1933 DOA: 04/14/2019 PCP: Marin Olp, MD  HPI/Recap of past 24 hours: HPI from Dr Calvin Burnett Concha Calvin Burnett a 85 y.o.malewith medical history significant ofA. fib on Eliquis, hypertension, GERD, Alzheimer's dementia, T1N0 adenocarcinoma status post robotic LOA, right colectomy, sigmoidectomy on 11/26/2017 presents to emergency department with acute abdominal pain x 1 day. Patient admitted with similar symptoms on 06/03/2018 with free intra-abdominal air. He was treated nonoperatively with antibiotics and bowel rest and discharged home in stable condition.He had an follow-up colonoscopy on 7/20 which showed diverticulosis. In the ED, Chest x-ray obtained which showed air under diaphragm. EDP consulted general surgery for further evaluation and management.    Today, patient denies any new complaints.  Denies any worsening abdominal pain, distention, nausea/vomiting, chest pain, fever/chills.  Upgraded diet to soft   Assessment/Plan: Principal Problem:   Perforation bowel ([place]) Active Problems:   Essential hypertension   GERD (gastroesophageal reflux disease)   Type 2 diabetes mellitus ([name])   AF (paroxysmal atrial fibrillation) ([name])   Hyperlipidemia   History of prostate cancer   History of colon cancer   Dementia ([place])   Perforation of viscus   Pneumoperitoneum  Pneumoperitoneum of unclear etiology, likely bowel perforation Had similar episode in 05/2018, resolved without surgery Last temp 100.5 on 04/17/2019, with no leukocytosis Chest x-ray with free air under the diaphragm CT abdomen pelvis showed considerably free intraperitoneal gas, exact site of perforation is uncertain, likely around the ileum Repeat abdominal x-ray on 04/16/2019 shows no visible pneumoperitoneum General surgery on board-conservative management for now Advance to soft diet, IV Zosyn Continue to hold Eliquis for now Monitor closely   CKD stage III Creatinine stable, baseline 1.5-1.7 D/C IV fluids Daily BMP  Hypertension Stable Continue IV hydralazine PRN, hold home hydrochlorothiazide, amlodipine  Diabetes mellitus type 2 Last A1c 6.7 SSI, Accu-Cheks, hypoglycemic protocol Hold home metformin  Paroxysmal A. Fib Rate controlled Restart home metoprolol, continue to hold Eliquis  History of colon adenocarcinoma Status post right colectomy, sigmoidectomy on 11/2017  Alzheimer's dementia Continue memantine       Malnutrition Type:      Malnutrition Characteristics:      Nutrition Interventions:       Estimated body mass index is 25.34 kg/m as calculated from the following:   Height as of this encounter: 5\' 10"  (1.778 m).   Weight as of this encounter: 80.1 kg.     Code Status: Full  Family Communication: None at bedside  Disposition Plan: To be determined   Consultants:  General surgery  Procedures:  None  Antimicrobials:  IV Zosyn  DVT prophylaxis: SCDs   Objective: Vitals:   04/18/19 0800 04/18/19 1529 04/18/19 2244 04/19/19 0431  BP:  118/72 121/89 118/70  Pulse:  90 80 80  Resp:  14 (!) 22 20  Temp:  99.4 F (37.4 C) 99.8 F (37.7 C) 98.2 F (36.8 C)  TempSrc:  Oral Oral Oral  SpO2:  99% 97% 98%  Weight: 80.1 kg     Height: 5\' 10"  (1.778 m)       Intake/Output Summary (Last 24 hours) at 04/19/2019 1539 Last data filed at 04/19/2019 0600 Gross per 24 hour  Intake 568.85 ml  Output 375 ml  Net 193.85 ml   Filed Weights   04/18/19 0800  Weight: 80.1 kg    Exam:  General: NAD   Cardiovascular: S1, S2 present  Respiratory: CTAB  Abdomen: Soft, mildly tender,  mildly distended, bowel sounds present  Musculoskeletal: No bilateral pedal edema noted  Skin: Normal  Psychiatry: Normal mood   Data Reviewed: CBC: Recent Labs  Lab 04/14/19 1237 04/15/19 0124 04/17/19 0407 04/18/19 0430 04/19/19 0351  WBC 11.2* 10.3 8.3 9.0 8.8   NEUTROABS 9.0*  --  6.5 7.0 6.3  HGB 17.5* 15.5 13.8 13.3 12.9*  HCT 49.9 43.3 38.5* 36.7* 36.3*  MCV 94.9 92.9 92.8 92.0 91.9  PLT 128* 107* 126* 147* [id number]   Basic Metabolic Panel: Recent Labs  Lab 04/14/19 1237 04/15/19 0124 04/16/19 0838 04/18/19 0430 04/19/19 0351  NA 137 137 141 136 137  K 3.9 3.4* 3.4* 2.9* 3.3*  CL 99 104 108 105 107  CO2 24 21* 23 20* 21*  GLUCOSE 188* 149* 156* 115* 130*  BUN 23 22 28* 15 11  CREATININE 1.54* 1.82* 1.62* 1.36* 1.28*  CALCIUM 9.1 7.7* 7.3* 7.1* 7.6*   GFR: Estimated Creatinine Clearance: 45.1 mL/min (A) (by C-G formula based on SCr of 1.28 mg/dL (H)). Liver Function Tests: Recent Labs  Lab 04/14/19 1237  AST 32  ALT 30  ALKPHOS 82  BILITOT 2.1*  PROT 7.8  ALBUMIN 3.9   Recent Labs  Lab 04/14/19 1237  LIPASE 22   No results for input(s): AMMONIA in the last 168 hours. Coagulation Profile: Recent Labs  Lab 04/14/19 1237  INR 1.4*   Cardiac Enzymes: No results for input(s): CKTOTAL, CKMB, CKMBINDEX, TROPONINI in the last 168 hours. BNP (last 3 results) Recent Labs    01/29/19 0852  PROBNP 165.0*   HbA1C: No results for input(s): HGBA1C in the last 72 hours. CBG: Recent Labs  Lab 04/18/19 1157 04/18/19 1732 04/18/19 2134 04/19/19 0837 04/19/19 1216  GLUCAP 181* 156* 127* 128* 203*   Lipid Profile: No results for input(s): CHOL, HDL, LDLCALC, TRIG, CHOLHDL, LDLDIRECT in the last 72 hours. Thyroid Function Tests: No results for input(s): TSH, T4TOTAL, FREET4, T3FREE, THYROIDAB in the last 72 hours. Anemia Panel: No results for input(s): VITAMINB12, FOLATE, FERRITIN, TIBC, IRON, RETICCTPCT in the last 72 hours. Urine analysis:    Component Value Date/Time   COLORURINE YELLOW 04/14/2019 1650   APPEARANCEUR CLEAR 04/14/2019 1650   LABSPEC >1.046 (H) 04/14/2019 1650   PHURINE 5.0 04/14/2019 1650   GLUCOSEU NEGATIVE 04/14/2019 1650   HGBUR MODERATE (A) 04/14/2019 1650   HGBUR negative 07/15/2007 0757    BILIRUBINUR NEGATIVE 04/14/2019 1650   BILIRUBINUR neg 10/13/2014 1447   KETONESUR NEGATIVE 04/14/2019 1650   PROTEINUR 100 (A) 04/14/2019 1650   UROBILINOGEN 0.2 10/13/2014 1447   UROBILINOGEN 0.2 09/29/2014 1340   NITRITE NEGATIVE 04/14/2019 1650   LEUKOCYTESUR NEGATIVE 04/14/2019 1650   Sepsis Labs: @LABRCNTIP (procalcitonin:4,lacticidven:4)  ) Recent Results (from the past 240 hour(s))  SARS Coronavirus 2 [facility] order, Performed in [facility] hospital lab) Nasopharyngeal Nasopharyngeal Swab     Status: None   Collection Time: 04/14/19 12:38 PM   Specimen: Nasopharyngeal Swab  Result Value Ref Range Status   SARS Coronavirus 2 NEGATIVE NEGATIVE Final    Comment: (NOTE) If result is NEGATIVE SARS-CoV-2 target nucleic acids are NOT DETECTED. The SARS-CoV-2 RNA is generally detectable in upper and lower  respiratory specimens during the acute phase of infection. The lowest  concentration of SARS-CoV-2 viral copies this assay can detect is 250  copies / mL. A negative result does not preclude SARS-CoV-2 infection  and should not be used as the sole basis for treatment or other  patient management decisions.  A negative result may occur with  improper specimen collection / handling, submission of specimen other  than nasopharyngeal swab, presence of viral mutation(s) within the  areas targeted by this assay, and inadequate number of viral copies  (<250 copies / mL). A negative result must be combined with clinical  observations, patient history, and epidemiological information. If result is POSITIVE SARS-CoV-2 target nucleic acids are DETECTED. The SARS-CoV-2 RNA is generally detectable in upper and lower  respiratory specimens dur ing the acute phase of infection.  Positive  results are indicative of active infection with SARS-CoV-2.  Clinical  correlation with patient history and other diagnostic information is  necessary to determine patient infection status.  Positive  results do  not rule out bacterial infection or co-infection with other viruses. If result is PRESUMPTIVE POSTIVE SARS-CoV-2 nucleic acids MAY BE PRESENT.   A presumptive positive result was obtained on the submitted specimen  and confirmed on repeat testing.  While 2019 novel coronavirus  (SARS-CoV-2) nucleic acids may be present in the submitted sample  additional confirmatory testing may be necessary for epidemiological  and / or clinical management purposes  to differentiate between  SARS-CoV-2 and other Sarbecovirus currently known to infect humans.  If clinically indicated additional testing with an alternate test  methodology [phone]) is advised. The SARS-CoV-2 RNA is generally  detectable in upper and lower respiratory sp ecimens during the acute  phase of infection. The expected result is Negative. Fact Sheet for Patients:  StrictlyIdeas.no Fact Sheet for Healthcare Providers: BankingDealers.co.za This test is not yet approved or cleared by the [place] FDA and has been authorized for detection and/or diagnosis of SARS-CoV-2 by FDA under an Emergency Use Authorization (EUA).  This EUA will remain in effect (meaning this test can be used) for the duration of the COVID-19 declaration under Section 564(b)(1) of the Act, 21 U.S.C. section 360bbb-3(b)(1), unless the authorization is terminated or revoked sooner. Performed at [facility], [place] [address], [place], [place] [address]       Studies: No results found.  Scheduled Meds: . insulin aspart  0-15 Units Subcutaneous TID WC  . insulin aspart  0-5 Units Subcutaneous QHS  . memantine  28 mg Oral Daily  . metoprolol succinate  12.5 mg Oral Daily    Continuous Infusions: . sodium chloride Stopped (04/19/19 0208)  . piperacillin-tazobactam (ZOSYN)  IV 3.375 g (04/19/19 1031)     LOS: 5 days     Alma Friendly, MD [name]  If 7PM-7AM,  please contact night-coverage [email or web address] 04/19/2019, 3:39 PM

## 2019-04-19 NOTE — Progress Notes (Addendum)
[facility]/Trauma Progress Note      Assessment/Plan A fib, on eliquis, being held Dementia HTN HxT1N0colon cancer of hepatic flexure, sigmoid stricture S/P robotic LOA, right colectomy, sigmoidectomy 11/26/17  Pneumoperitoneum of unclear etiology - patient had an episode similar to this in November of 2019 and resolved without surgery. - Vitals stable - today is first day on soft diet. I would recommend seeing how he does for 24hrs on this diet and if he tolerates without increase in pain then he may be okay for discharge tomorrow. - we will follow   - pt had a recent colonoscopy that was normal (per pt) and I recommended he follow up with his GI doc to have an upper endoscopy and possibly a capsule endoscopy to try to determine what area of his intestine is causing this issue.   FEN - soft diet VTE -SCDs, continue to hold Eliquis until ready for discharge ID -Zosyn Follow up: TBD    LOS: 5 days    Subjective: CC: no complaints  Pt has no abdominal pain at rest. He states it only hurts if you mash on it. No N, V, F or chills overnight. He is having BM's and flatus. Breakfast was his first meal on a soft diet. He feels a little bloated.  Objective: Vital signs in last 24 hours: Temp:  [98.2 F (36.8 C)-99.8 F (37.7 C)] 98.2 F (36.8 C) (10/05 0431) Pulse Rate:  [80-90] 80 (10/05 0431) Resp:  [14-22] 20 (10/05 0431) BP: (118-121)/(70-89) 118/70 (10/05 0431) SpO2:  [97 %-99 %] 98 % (10/05 0431) Last BM Date: 04/19/19  Intake/Output from previous day: 10/04 0701 - 10/05 0700 In: 1338.9 [P.O.:900; I.V.:274.9; IV Piggyback:164] Out: 375 [Urine:375] Intake/Output this shift: No intake/output data recorded.  PE: Gen:  Alert, NAD, pleasant, cooperative Pulm:  Rate and effort normal Abd: Soft, mild distention, +BS, mild (4/10) generalized TTP without guarding. No peritonitis  Skin: no rashes noted, warm and dry   Anti-infectives: Anti-infectives  (From admission, onward)   Start     Dose/Rate Route Frequency Ordered Stop   04/14/19 1830  piperacillin-tazobactam (ZOSYN) IVPB 3.375 g     3.375 g 12.5 mL/hr over 240 Minutes Intravenous Every 8 hours 04/14/19 1343     04/14/19 1345  piperacillin-tazobactam (ZOSYN) IVPB 3.375 g  Status:  Discontinued     3.375 g 100 mL/hr over 30 Minutes Intravenous Every 6 hours 04/14/19 1334 04/14/19 1337   04/14/19 1145  piperacillin-tazobactam (ZOSYN) IVPB 3.375 g     3.375 g 100 mL/hr over 30 Minutes Intravenous  Once 04/14/19 1133 04/14/19 1303      Lab Results:  Recent Labs    04/18/19 0430 04/19/19 0351  WBC 9.0 8.8  HGB 13.3 12.9*  HCT 36.7* 36.3*  PLT 147* 172   BMET Recent Labs    04/18/19 0430 04/19/19 0351  NA 136 137  K 2.9* 3.3*  CL 105 107  CO2 20* 21*  GLUCOSE 115* 130*  BUN 15 11  CREATININE 1.36* 1.28*  CALCIUM 7.1* 7.6*   PT/INR No results for input(s): LABPROT, INR in the last 72 hours. CMP     Component Value Date/Time   NA 137 04/19/2019 0351   K 3.3 (L) 04/19/2019 0351   CL 107 04/19/2019 0351   CO2 21 (L) 04/19/2019 0351   GLUCOSE 130 (H) 04/19/2019 0351   BUN 11 04/19/2019 0351   CREATININE 1.28 (H) 04/19/2019 0351   CALCIUM 7.6 (L) 04/19/2019 [id number]  PROT 7.8 04/14/2019 1237   ALBUMIN 3.9 04/14/2019 1237   AST 32 04/14/2019 1237   ALT 30 04/14/2019 1237   ALKPHOS 82 04/14/2019 1237   BILITOT 2.1 (H) 04/14/2019 1237   GFRNONAA 51 (L) 04/19/2019 0351   GFRAA 60 (L) 04/19/2019 0351   Lipase     Component Value Date/Time   LIPASE 22 04/14/2019 1237    Studies/Results: No results found.   Kalman Drape, [facility] Pager [phone] Cristine Polio, & Friday 7:00am - 4:30pm Thursdays 7:00am -11:30am

## 2019-04-19 NOTE — Care Management Important Message (Signed)
Important Message  Patient Details  Name: Calvin Burnett MRN: [id number] Date of Birth: [birth year]   Medicare Important Message Given:  Yes     Memory Argue 04/19/2019, 4:47 PM

## 2019-04-19 NOTE — Social Work (Signed)
CSW deactivating "legal guardian" banner. Pt is from home alone; he requires no PT f/u or DME. Pt is oriented x3 (self, place, situation) and does not have any documentation indicating court appointed legal guardian.  CSW signing off. Please consult if any additional needs arise.  Alexander [place], [place] Work [phone]

## 2019-04-20 LAB — CBC WITH DIFFERENTIAL/PLATELET
Abs Immature Granulocytes: 0.29 10*3/uL — ABNORMAL HIGH (ref 0.00–0.07)
Basophils Absolute: 0.1 10*3/uL (ref 0.0–0.1)
Basophils Relative: 1 %
Eosinophils Absolute: 0.8 10*3/uL — ABNORMAL HIGH (ref 0.0–0.5)
Eosinophils Relative: 10 %
HCT: 35 % — ABNORMAL LOW (ref 39.0–52.0)
Hemoglobin: 12.3 g/dL — ABNORMAL LOW (ref 13.0–17.0)
Immature Granulocytes: 4 %
Lymphocytes Relative: 17 %
Lymphs Abs: 1.4 10*3/uL (ref 0.7–4.0)
MCH: 32.7 pg (ref 26.0–34.0)
MCHC: 35.1 g/dL (ref 30.0–36.0)
MCV: 93.1 fL (ref 80.0–100.0)
Monocytes Absolute: 0.6 10*3/uL (ref 0.1–1.0)
Monocytes Relative: 8 %
Neutro Abs: 4.9 10*3/uL (ref 1.7–7.7)
Neutrophils Relative %: 60 %
Platelets: 224 10*3/uL (ref 150–400)
RBC: 3.76 MIL/uL — ABNORMAL LOW (ref 4.22–5.81)
RDW: 13.9 % (ref 11.5–15.5)
WBC: 8.1 10*3/uL (ref 4.0–10.5)
nRBC: 0 % (ref 0.0–0.2)

## 2019-04-20 LAB — BASIC METABOLIC PANEL
Anion gap: 10 (ref 5–15)
BUN: 12 mg/dL (ref 8–23)
CO2: 21 mmol/L — ABNORMAL LOW (ref 22–32)
Calcium: 7.4 mg/dL — ABNORMAL LOW (ref 8.9–10.3)
Chloride: 107 mmol/L (ref 98–111)
Creatinine, Ser: 1.36 mg/dL — ABNORMAL HIGH (ref 0.61–1.24)
GFR calc Af Amer: 55 mL/min — ABNORMAL LOW (ref >60)
GFR calc non Af Amer: 48 mL/min — ABNORMAL LOW (ref >60)
Glucose, Bld: 118 mg/dL — ABNORMAL HIGH (ref 70–99)
Potassium: 3.3 mmol/L — ABNORMAL LOW (ref 3.5–5.1)
Sodium: 138 mmol/L (ref 135–145)

## 2019-04-20 LAB — GLUCOSE, CAPILLARY
Glucose-Capillary: 129 mg/dL — ABNORMAL HIGH (ref 70–99)
Glucose-Capillary: 143 mg/dL — ABNORMAL HIGH (ref 70–99)

## 2019-04-20 MED ORDER — POTASSIUM CHLORIDE CRYS ER 20 MEQ PO TBCR
40.0000 meq | EXTENDED_RELEASE_TABLET | Freq: Once | ORAL | Status: AC
  Administered 2019-04-20: 40 meq via ORAL
  Filled 2019-04-20: qty 2

## 2019-04-20 NOTE — Progress Notes (Signed)
Occupational Therapy Evaluation Patient Details Name: Calvin Burnett MRN: [id number] DOB: [birth year] Today's Date: 04/20/2019    History of Present Illness Patient is a 85 y/o male who presents with abdominal pain. Found to have bowel peforation.  PMH includes DM, HTN, dementia, T1N0 adenocarcinoma status post robotic LOA, right colectomy, sigmoidectomy on 11/26/2017, A-fib.   Clinical Impression   PTA, pt lived in one story home alone, and was independent for ADLs, IADLs, and driving. Pt currently presents with decreased activity tolerance and increased pain. Pt performed functional mobility, oral care, washing face, LB dressing, and simulated toilet transfer with supervision for safety. Recommend dc home with no OT follow up once medically stable per MD. All education and acute needs met, will sign off.     Follow Up Recommendations  No OT follow up    Equipment Recommendations  None recommended by OT    Recommendations for Other Services PT consult     Precautions / Restrictions Precautions Precautions: Fall Restrictions Weight Bearing Restrictions: No      Mobility Bed Mobility Overal bed mobility: Independent             General bed mobility comments: sitting in recliner upon arrival  Transfers Overall transfer level: Needs assistance Equipment used: None Transfers: Sit to/from Stand Sit to Stand: Supervision         General transfer comment: supervision for safety    Balance Overall balance assessment: Mild deficits observed, not formally tested                                         ADL either performed or assessed with clinical judgement   ADL Overall ADL's : Needs assistance/impaired Eating/Feeding: Independent   Grooming: Wash/dry face;Oral care;Supervision/safety;Standing Grooming Details (indicate cue type and reason): performed grooming with supervision for safety and balance Upper Body Bathing: Supervision/ safety;Sitting    Lower Body Bathing: Supervison/ safety;Sit to/from stand   Upper Body Dressing : Supervision/safety;Sitting   Lower Body Dressing: Supervision/safety;Sit to/from stand   Toilet Transfer: Supervision/safety;Ambulation(simulated to recliner)   Toileting- Clothing Manipulation and Hygiene: Supervision/safety;Sit to/from stand       Functional mobility during ADLs: Supervision/safety General ADL Comments: Pt performed most ADLs withs supervision for safety     Vision Baseline Vision/History: Wears glasses Wears Glasses: At all times       Perception     Praxis      Pertinent Vitals/Pain Pain Assessment: No/denies pain     Hand Dominance Right   Extremity/Trunk Assessment Upper Extremity Assessment Upper Extremity Assessment: Overall WFL for tasks assessed   Lower Extremity Assessment Lower Extremity Assessment: Defer to PT evaluation   Cervical / Trunk Assessment Cervical / Trunk Assessment: Kyphotic   Communication Communication Communication: No difficulties   Cognition Arousal/Alertness: Awake/alert Behavior During Therapy: WFL for tasks assessed/performed Overall Cognitive Status: History of cognitive impairments - at baseline Area of Impairment:                               General Comments:    General Comments       Exercises    Shoulder Instructions      Home Living Family/patient expects to be discharged to:: Private residence Living Arrangements: Alone Available Help at Discharge: Family;Available PRN/intermittently Type of Home: House Home Access: Stairs to enter [employer] of  Steps: 1 Entrance Stairs-Rails: None Home Layout: One level     Bathroom Shower/Tub: Tub/shower unit;Walk-in shower   Bathroom Toilet: Standard     Home Equipment: Environmental consultant - 2 wheels;Cane - single point   Additional Comments: uses walk in shower      Prior Functioning/Environment Level of Independence: Independent         Comments: pt is driving, reports he doesnt cook but eats out.        OT Problem List: Decreased range of motion;Decreased activity tolerance;Decreased strength;Decreased safety awareness;Decreased knowledge of use of DME or AE;Decreased knowledge of precautions;Decreased cognition;Pain      OT Treatment/Interventions:      OT Goals(Current goals can be found in the care plan section) Acute Rehab OT Goals Patient Stated Goal: to get better and go home OT Goal Formulation: All assessment and education complete, DC therapy  OT Frequency:     Barriers to D/C:            Co-evaluation              AM-PAC OT "6 Clicks" Daily Activity     Outcome Measure Help from another person eating meals?: None Help from another person taking care of personal grooming?: None Help from another person toileting, which includes using toliet, bedpan, or urinal?: None Help from another person bathing (including washing, rinsing, drying)?: None Help from another person to put on and taking off regular upper body clothing?: None Help from another person to put on and taking off regular lower body clothing?: None 6 Click Score: 24   End of Session Equipment Utilized During Treatment: Gait belt Nurse Communication: Mobility status  Activity Tolerance: Patient tolerated treatment well Patient left: in chair;with call bell/phone within reach  OT Visit Diagnosis: Muscle weakness (generalized) (M62.81);Pain Pain - part of body: (abdomen)                Time: 1133-1150 OT Time Calculation (min): 17 min Charges:  OT General Charges $OT Visit: 1 Visit OT Evaluation $OT Eval Moderate Complexity: [place], OT Student  Gus Rankin 04/20/2019, 12:20 PM

## 2019-04-20 NOTE — Discharge Instructions (Signed)
Low-Fiber Eating Plan Follow for 1-2 weeks or until you no longer have tenderness in your abdomen.  Take colace or miralax if you are having mild constipation. Call you GI doctor if you are having severe constipation. Follow up with your GI doctor within 2 weeks of discharge  Fiber is found in fruits, vegetables, whole grains, and beans. Eating a diet low in fiber helps to reduce how often you have bowel movements and how much you produce during a bowel movement. A low-fiber eating plan may help your digestive system heal if:  You have certain conditions, such as Crohn's disease or diverticulitis.  You recently had radiation therapy on your pelvis or bowel.  You recently had intestinal surgery.  You have a new surgical opening in your abdomen (colostomy or ileostomy).  Your intestine is narrowed (stricture). Your health care provider will determine how long you need to stay on this diet. Your health care provider may recommend that you work with a diet and nutrition specialist (dietitian). What are tips for following this plan? General guidelines  Follow recommendations from your dietitian about how much fiber you should have each day.  Most people on this eating plan should try to eat less than 10 grams (g) of fiber each day. Your daily fiber goal is _________________ g.  Take vitamin and mineral supplements as told by your health care provider or dietitian. Chewable or liquid forms are best when on this eating plan. Reading food labels  Check food labels for the amount of dietary fiber.  Choose foods that have less than 2 grams of fiber in one serving. Cooking  Use white flour and other allowed grains for baking and cooking.  Cook meat using methods that keep it tender, such as braising or poaching.  Cook eggs until the yolk is completely solid.  Cook with healthy oils, such as olive oil or canola oil. Meal planning   Eat 5-6 small meals throughout the day instead of 3  large meals.  If you are lactose intolerant: ? Choose low-lactose dairy foods. ? Do not eat dairy foods, if told by your dietitian.  Limit fat and oils to less than 8 teaspoons a day.  Eat small portions of desserts. What foods are allowed? The items listed below may not be a complete list. Talk with your dietitian about what dietary choices are best for you. Grains All bread and crackers made with white flour. Waffles, pancakes, and [place] toast. Bagels. Pretzels. Melba toast, zwieback, and matzoh. Cooked and dried cereals that do not contain whole grains, added fiber, seeds, or dried fruit. CornmealDomenick Gong. Hot and cold cereals made with refined corn, wheat, rice, or oats. Plain pasta and noodles. White rice. Vegetables Well-cooked or canned vegetables without skin, seeds, or stems. Cooked potatoes without skins. Vegetable juice. Fruits Soft-cooked or canned fruits without skin and seeds. Peeled ripe banana. Applesauce. Fruit juice without pulp. Meats and other protein foods Ground meat. Tender cuts of meat or poultry. Eggs. Fish, seafood, and shellfish. Smooth nut butters. Tofu. Dairy All milk products and drinks. Lactose-free milks, including rice, soy, and almond milks. Yogurt without fruit, nuts, chocolate, or granola mix-ins. Sour cream. Cottage cheese. Cheese. Beverages Decaf coffee. Fruit and vegetable juices or smoothies (in small amounts, with no pulp or skins, and with fruits from allowed list). Sports drinks. Herbal tea. Fats and oils Olive oil, canola oil, sunflower oil, flaxseed oil, and grapeseed oil. Mayonnaise. Cream cheese. Margarine. Butter. Sweets and desserts Plain cakes and cookies.  Cream pies and pies made with allowed fruits. Pudding. Custard. Fruit gelatin. Sherbet. Popsicles. Ice cream without nuts. Plain hard candy. Honey. Jelly. Molasses. Syrups, including chocolate syrup. Chocolate. Marshmallows. Gumdrops. Seasoning and other foods Bouillon. Broth. Cream  soups made from allowed foods. Strained soup. Casseroles made with allowed foods. Ketchup. Mild mustard. Mild salad dressings. Plain gravies. Vinegar. Spices in moderation. Salt. Sugar. What foods are not allowed? The items listed below may not be a complete list. Talk with your dietitian about what dietary choices are best for you. Grains Whole wheat and whole grain breads and crackers. Multigrain breads and crackers. Rye bread. Whole grain or multigrain cereals. Cereals with nuts, raisins, or coconut. Bran. Coarse wheat cereals. Granola. High-fiber cereals. Cornmeal or corn bread. Whole grain pasta. Wild or brown rice. Quinoa. Popcorn. Buckwheat. Wheat germ. Vegetables Potato skins. Raw or undercooked vegetables. All beans and bean sprouts. Cooked greens. Corn. Peas. Cabbage. Beets. Broccoli. Brussels sprouts. Cauliflower. Mushrooms. Onions. Peppers. Parsnips. Okra. Sauerkraut. Fruit Raw or dried fruit. Berries. Fruit juice with pulp. Prune juice. Meats and other protein foods Tough, fibrous meats with gristle. Fatty meat. Poultry with skin. Fried meat, Sales executive, or fish. Deli or lunch meats. Sausage, bacon, and hot dogs. Nuts and chunky nut butter. Dried peas, beans, and lentils. Dairy Yogurt with fruit, nuts, chocolate, or granola mix-ins. Beverages Caffeinated coffee and teas. Fats and oils Avocado. Coconut. Sweets and desserts Desserts, cookies, or candies that contain nuts or coconut. Dried fruit. Jams and preserves with seeds. Marmalade. Any dessert made with fruits or grains that are not allowed. Seasoning and other foods Corn tortilla chips. Soups made with vegetables or grains that are not allowed. Relish. Horseradish. Angie Fava. Olives. Summary  Most people on a low-fiber eating plan should eat less than 10 grams of fiber a day. Follow recommendations from your dietitian about how much fiber you should have each day.  Always check food labels to see the dietary fiber content of  packaged foods. In general, a low-fiber food will have fewer than 2 grams of fiber per serving.  In general, try to avoid whole grains, raw fruits and vegetables, dried fruit, tough cuts of meat, nuts, and seeds.  Take a vitamin and mineral supplement as told by your health care provider or dietitian. This information is not intended to replace advice given to you by your health care provider. Make sure you discuss any questions you have with your health care provider. Document Released: 12/21/2001 Document Revised: 10/23/2018 Document Reviewed: 09/03/2016 Elsevier Patient Education  2020 [employer].

## 2019-04-20 NOTE — Progress Notes (Addendum)
[facility]/Trauma Progress Note      Assessment/Plan A fib, on eliquis, being held Dementia HTN HxT1N0colon cancer of hepatic flexure, sigmoid stricture S/P robotic LOA, right colectomy, sigmoidectomy 11/26/17  Pneumoperitoneum of unclear etiology - patient had an episode similar to this in November of 2019 and resolved without surgery. - Vitals stable, WBC WNL, afebrile   - pt had a recent colonoscopy that showed diverticulosis and I recommended he follow up with his GI doc to have an upper endoscopy and possibly a capsule endoscopy to try to determine what area of his intestine is causing this issue.   FEN- soft diet VTE -SCDs, Okay to resume eliquis ID -Zosyn Follow up: GI  Plan: pt is okay for discharge from a surgical standpoint. Low fiber diet information in AVS   LOS: 6 days    Subjective: CC: no complaints  No complaints of abdominal pain. No N, V fever or chills. He denies bloating. He is tolerating his diet and already had breakfast today. He is having flatus.   Objective: Vital signs in last 24 hours: Temp:  [98.5 F (36.9 C)-100.2 F (37.9 C)] 98.5 F (36.9 C) (10/06 0617) Pulse Rate:  [64-82] 64 (10/06 0617) Resp:  [17-18] 17 (10/06 0617) BP: (114-131)/(73-78) 131/78 (10/06 0617) SpO2:  [97 %-98 %] 97 % (10/06 0617) Last BM Date: 04/19/19  Intake/Output from previous day: No intake/output data recorded. Intake/Output this shift: No intake/output data recorded.  PE: Gen:  Alert, NAD, pleasant, cooperative Pulm:  Rate and effort normal Abd: Soft, mild distention, +BS, mild RLQ TTP without guarding. No peritonitis  Skin: no rashes noted, warm and dry  Anti-infectives: Anti-infectives (From admission, onward)   Start     Dose/Rate Route Frequency Ordered Stop   04/14/19 1830  piperacillin-tazobactam (ZOSYN) IVPB 3.375 g     3.375 g 12.5 mL/hr over 240 Minutes Intravenous Every 8 hours 04/14/19 1343     04/14/19 1345   piperacillin-tazobactam (ZOSYN) IVPB 3.375 g  Status:  Discontinued     3.375 g 100 mL/hr over 30 Minutes Intravenous Every 6 hours 04/14/19 1334 04/14/19 1337   04/14/19 1145  piperacillin-tazobactam (ZOSYN) IVPB 3.375 g     3.375 g 100 mL/hr over 30 Minutes Intravenous  Once 04/14/19 1133 04/14/19 1303      Lab Results:  Recent Labs    04/19/19 0351 04/20/19 0304  WBC 8.8 8.1  HGB 12.9* 12.3*  HCT 36.3* 35.0*  PLT 172 224   BMET Recent Labs    04/19/19 0351 04/20/19 0304  NA 137 138  K 3.3* 3.3*  CL 107 107  CO2 21* 21*  GLUCOSE 130* 118*  BUN 11 12  CREATININE 1.28* 1.36*  CALCIUM 7.6* 7.4*   PT/INR No results for input(s): LABPROT, INR in the last 72 hours. CMP     Component Value Date/Time   NA 138 04/20/2019 0304   K 3.3 (L) 04/20/2019 0304   CL 107 04/20/2019 0304   CO2 21 (L) 04/20/2019 0304   GLUCOSE 118 (H) 04/20/2019 0304   BUN 12 04/20/2019 0304   CREATININE 1.36 (H) 04/20/2019 0304   CALCIUM 7.4 (L) 04/20/2019 0304   PROT 7.8 04/14/2019 1237   ALBUMIN 3.9 04/14/2019 1237   AST 32 04/14/2019 1237   ALT 30 04/14/2019 1237   ALKPHOS 82 04/14/2019 1237   BILITOT 2.1 (H) 04/14/2019 1237   GFRNONAA 48 (L) 04/20/2019 0304   GFRAA 55 (L) 04/20/2019 0304   Lipase  Component Value Date/Time   LIPASE 22 04/14/2019 1237    Studies/Results: No results found.   Kalman Drape, [facility] Pager [phone] Cristine Polio, & Friday 7:00am - 4:30pm Thursdays 7:00am -11:30am

## 2019-04-20 NOTE — Progress Notes (Signed)
Patient discharged to home with instructions given to patient's son.

## 2019-04-20 NOTE — Progress Notes (Signed)
Physical Therapy Treatment/ Discharge Patient Details Name: Calvin Burnett MRN: [id number] DOB: [birth year] Today's Date: 04/20/2019    History of Present Illness Patient is a 85 y/o male who presents with abdominal pain. Found to have bowel peforation.  PMH includes DM, HTN, dementia, T1N0 adenocarcinoma status post robotic LOA, right colectomy, sigmoidectomy on 11/26/2017, A-fib.    PT Comments    Pt in chair on arrival, very pleasant and willing to mobilize. Pt reports walking several miles a day has a RW at home but does not use it and no report of falls. Pt able to perform all transfers, gait and entry step without assist. Pt reports feeling slightly weaker than his baseline and educated for HEP as well as encouraged to maintain daily walking acutely and upon return home. No further acute therapy needs with pt aware and agreeable. Will sign off.    Follow Up Recommendations  No PT follow up     Equipment Recommendations  None recommended by PT    Recommendations for Other Services       Precautions / Restrictions Precautions Precautions: Fall    Mobility  Bed Mobility Overal bed mobility: Independent             General bed mobility comments: supine <>sit  Transfers Overall transfer level: Independent               General transfer comment: pt able to stand from bed and chair without assist  Ambulation/Gait Ambulation/Gait assistance: Independent Gait Distance (Feet): 1000 Feet Assistive device: None Gait Pattern/deviations: WFL(Within Functional Limits)   Gait velocity interpretation: >4.37 ft/sec, indicative of normal walking speed General Gait Details: pt with steady gait including head turns and one partial LOB but able to recover without assist   Stairs Stairs: Yes Stairs assistance: Modified independent (Device/Increase time) Stair Management: Step to pattern;Forwards Number of Stairs: 1     Wheelchair Mobility    Modified Rankin (Stroke  Patients Only)       Balance Overall balance assessment: Mild deficits observed, not formally tested                                          Cognition Arousal/Alertness: Awake/alert Behavior During Therapy: WFL for tasks assessed/performed Overall Cognitive Status: Impaired/Different from baseline Area of Impairment: Orientation                 Orientation Level: Time             General Comments: disoriented to day of week      Exercises General Exercises - Lower Extremity Long Arc Quad: AROM;20 reps;Both;Seated Hip Flexion/Marching: AROM;20 reps;Both;Seated    General Comments        Pertinent Vitals/Pain Pain Assessment: No/denies pain    Home Living                      Prior Function            PT Goals (current goals can now be found in the care plan section) Progress towards PT goals: Goals met/education completed, patient discharged from PT    Frequency           PT Plan Discharge plan needs to be updated    Co-evaluation              AM-PAC PT "6 Clicks" Mobility   Outcome  Measure  Help needed turning from your back to your side while in a flat bed without using bedrails?: None Help needed moving from lying on your back to sitting on the side of a flat bed without using bedrails?: None Help needed moving to and from a bed to a chair (including a wheelchair)?: None Help needed standing up from a chair using your arms (e.g., wheelchair or bedside chair)?: None Help needed to walk in hospital room?: None Help needed climbing 3-5 steps with a railing? : None 6 Click Score: 24    End of Session   Activity Tolerance: Patient tolerated treatment well Patient left: in chair;with call bell/phone within reach Nurse Communication: Mobility status PT Visit Diagnosis: Muscle weakness (generalized) (M62.81)     Time: 1550-2714 PT Time Calculation (min) (ACUTE ONLY): 16 min  Charges:  $Gait Training:  8-22 mins                     [place] Pager: [phone] Office: [place] 04/20/2019, 11:09 AM

## 2019-04-20 NOTE — Discharge Summary (Signed)
Discharge Summary  [name] [id number] DOB: [birth year]  PCP: Marin Olp, MD  Admit date: 04/14/2019 Discharge date: 04/20/2019  Time spent: 35 mins  Recommendations for Outpatient Follow-up:  1. PCP in 1 week 2. GI in 1 week  Discharge Diagnoses:  Active Hospital Problems   Diagnosis Date Noted   Perforation bowel ([place]) 04/14/2019   Perforation of viscus    Pneumoperitoneum    Hyperlipidemia 04/14/2019   History of prostate cancer 04/14/2019   History of colon cancer 04/14/2019   Dementia ([place]) 04/14/2019   AF (paroxysmal atrial fibrillation) ([place]) 04/09/2017   Type 2 diabetes mellitus ([place]) 07/31/2010   GERD (gastroesophageal reflux disease) 11/05/2007   Essential hypertension 01/30/2007    Resolved Hospital Problems  Calvin Burnett resolved problems to display.    Discharge Condition: Stable  Diet recommendation: Low fiber diet  Vitals:   04/19/19 2115 04/20/19 0617  BP: 116/73 131/78  Pulse: 81 64  Resp: 18 17  Temp: 98.7 F (37.1 C) 98.5 F (36.9 C)  SpO2: 98% 97%    History of present illness:  Calvin Horne Turneris a 85 y.o.malewith medical history significant ofA. fib on Eliquis, hypertension, GERD, Alzheimer's dementia, T1N0 adenocarcinoma status post robotic LOA, right colectomy, sigmoidectomy on 11/26/2017 presents to emergency department with acute abdominal pain x 1 day. Patient admitted with similar symptoms on 06/03/2018 with free intra-abdominal air. He was treated nonoperatively with antibiotics and bowel rest and discharged home in stable condition.He had an follow-up colonoscopy on 7/20 which showed diverticulosis. In the ED, Chest x-ray obtained which showed air under diaphragm. EDP consulted general surgery for further evaluation and management.    Today, patient denies any new complaints.  Patient tolerated regular soft diet, denies any worsening abdominal pain, nausea/vomiting, fever/chills.  Having regular BMs.  Reports that  his daughter is coming to spend a few nights with him from [place].  Son lives about 15 minutes away from him, and visits him often.  Patient stable to be discharged home.  Patient advised to follow-up with PCP in 1 week as well as gastroenterology.    Hospital Course:  Principal Problem:   Perforation bowel ([place]) Active Problems:   Essential hypertension   GERD (gastroesophageal reflux disease)   Type 2 diabetes mellitus ([name])   AF (paroxysmal atrial fibrillation) ([name])   Hyperlipidemia   History of prostate cancer   History of colon cancer   Dementia ([place])   Perforation of viscus   Pneumoperitoneum  Pneumoperitoneum of unclear etiology, likely bowel perforation Had similar episode in 05/2018, resolved without surgery Last temp 100.5 on 04/17/2019, with Calvin Burnett leukocytosis Chest x-ray with free air under the diaphragm CT abdomen pelvis showed considerably free intraperitoneal gas, exact site of perforation is uncertain, likely around the ileum Repeat abdominal x-ray on 04/16/2019 shows Calvin Burnett visible pneumoperitoneum General surgery on board-recommended conservative management Completed 7 days of IV Zosyn Soft, low fiber diet Follow up with GI, PCP in 1 week Will need further work-up with GI, endoscopy versus capsule endoscopy to figure out source of the pneumoperitoneum as it has happened twice within the past year  CKD stage III Creatinine stable, baseline 1.5-1.7 Follow-up with PCP  Hypertension Stable Continue home amlodipine, discontinued home HCTZ given CKD and advanced age.  If BP remains uncontrolled, may add on ACE/ARB given history of diabetes  Diabetes mellitus type 2 Last A1c 6.7 Continue home metformin  Paroxysmal A. Fib Rate controlled Continue home metoprolol, restart Eliquis  History of colon adenocarcinoma Status  post right colectomy, sigmoidectomy on 11/2017  Alzheimer's dementia Continue memantine             Malnutrition Type:       Malnutrition Characteristics:      Nutrition Interventions:      Estimated body mass index is 25.34 kg/m as calculated from the following:   Height as of this encounter: 5\' 10"  (1.778 m).   Weight as of this encounter: 80.1 kg.    Procedures:  None  Consultations:  General surgery  Discharge Exam: BP 131/78 (BP Location: Left Arm)    Pulse 64    Temp 98.5 F (36.9 C) (Oral)    Resp 17    Ht 5\' 10"  (1.778 m)    Wt 80.1 kg    SpO2 97%    BMI 25.34 kg/m   General: NAD Abdomen: Soft nontender, nondistended, bowel sounds present Cardiovascular: S1, S2 present Respiratory: CTA B  Discharge Instructions You were cared for by a hospitalist during your hospital stay. If you have any questions about your discharge medications or the care you received while you were in the hospital after you are discharged, you can call the unit and asked to speak with the hospitalist on call if the hospitalist that took care of you is not available. Once you are discharged, your primary care physician will handle any further medical issues. Please note that Calvin Burnett REFILLS for any discharge medications will be authorized once you are discharged, as it is imperative that you return to your primary care physician (or establish a relationship with a primary care physician if you do not have one) for your aftercare needs so that they can reassess your need for medications and monitor your lab values.   Allergies as of 04/20/2019      Reactions   Kcentra [prothrombin Complex Conc Human] Anaphylaxis   Patient immediately became short of breath and bright red. Started improving minutes after infusion stopped.      Medication List    STOP taking these medications   hydrochlorothiazide 12.5 MG tablet Commonly known as: HYDRODIURIL     TAKE these medications   acetaminophen 500 MG tablet Commonly known as: TYLENOL Take 500 mg by mouth daily as needed for moderate pain or headache.   amLODipine 5 MG  tablet Commonly known as: NORVASC Take 5 mg by mouth daily.   apixaban 5 MG Tabs tablet Commonly known as: Eliquis Take 1 tablet (5 mg total) by mouth 2 (two) times daily.   latanoprost 0.005 % ophthalmic solution Commonly known as: XALATAN Place 1 drop into both eyes at bedtime.   memantine 28 MG Cp24 24 hr capsule Commonly known as: NAMENDA XR TAKE 1 CAPSULE (28 MG TOTAL) BY MOUTH DAILY.   metFORMIN 500 MG 24 hr tablet Commonly known as: GLUCOPHAGE-XR TAKE 1 TABLET BY MOUTH EVERY DAY WITH BREAKFAST What changed: See the new instructions.   metoprolol succinate 25 MG 24 hr tablet Commonly known as: TOPROL-XL TAKE 1/2 TABLET BY MOUTH EVERY DAY      Allergies  Allergen Reactions   Kcentra [Prothrombin Complex Conc Human] Anaphylaxis    Patient immediately became short of breath and bright red. Started improving minutes after infusion stopped.   Follow-up Information    Ladene Artist, MD. Schedule an appointment as soon as possible for a visit in 2 week(s).   Specialty: Gastroenterology Why: for follow up Contact information: [address] [place] [address] [phone]  Marin Olp, MD. Schedule an appointment as soon as possible for a visit in 1 week(s).   Specialty: Family Medicine Contact information: [address] [place]  [address] [phone]            The results of significant diagnostics from this hospitalization (including imaging, microbiology, ancillary and laboratory) are listed below for reference.    Significant Diagnostic Studies: Dg Abd 1 View  Result Date: 04/16/2019 CLINICAL DATA:  Pneumoperitoneum EXAM: ABDOMEN - 1 VIEW COMPARISON:  06/06/2018, CT 04/14/2019 FINDINGS: Gaseous distension of the transverse colon, favor ileus. Prior cholecystectomy. Calvin Burnett visible pneumoperitoneum as seen on prior CT. Calvin Burnett organomegaly. Radiation seeds in the region of the prostate. IMPRESSION: Calvin Burnett visible pneumoperitoneum on  today's study. Electronically Signed   By: Rolm Baptise M.D.   On: 04/16/2019 08:38   Ct Abdomen Pelvis W Contrast  Result Date: 04/14/2019 CLINICAL DATA:  Abdominal pain for 5 days. History of right hemicolectomy and lysis of adhesions. EXAM: CT ABDOMEN AND PELVIS WITH CONTRAST TECHNIQUE: Multidetector CT imaging of the abdomen and pelvis was performed using the standard protocol following bolus administration of intravenous contrast. CONTRAST:  46mL OMNIPAQUE IOHEXOL 300 MG/ML  SOLN COMPARISON:  06/03/2018 and chest radiograph of 03/18/2019 FINDINGS: Lower chest: Mild mitral and aortic valve calcifications. Hepatobiliary: Stable 1.3 by 0.8 cm hypodense lesion in segment 2 of the liver on image 15/3. Stable 0.4 cm hypodense lesion in the right hepatic lobe on image 22/3. Other small hypodense hepatic lesions likewise appear stable. Gallbladder surgically absent. Mild extrahepatic biliary prominence is likely a physiologic response to cholecystectomy. Pancreas: Unremarkable Spleen: Unremarkable Adrenals/Urinary Tract: Small hypodense bilateral renal lesions favor cysts but are technically too small to characterize. Small parapelvic cysts are present bilaterally. The adrenal glands appear normal. Stomach/Bowel: Right hemicolectomy, anastomotic staple line also noted in the sigmoid colon. There is extensive free intraperitoneal gas in the upper abdomen as well as scattered additional locules of free intraperitoneal gas along the peritoneum. The patient has a side-to-side ileocolostomy. The presumed blind-ending distal loop of ileum extends down into the right abdomen into an area of inflammation tracking along adjacent small bowel loops for example on image 50/3 and image 67/6. This region is a potential site for perforation given the local inflammatory findings. Scattered colonic diverticula are noted. Vascular/Lymphatic: Aortoiliac atherosclerotic vascular disease. Patent celiac trunk, superior mesenteric  artery, and inferior mesenteric artery. Calvin Burnett pathologic adenopathy is identified. Reproductive: Brachytherapy seed implants in the prostate gland. Other: In addition to the free intraperitoneal gas there is abnormal inflammatory stranding in the right lower quadrant and central abdominal mesentery with a small amount of fluid tracking in this vicinity and along the adjacent omentum. Musculoskeletal: Small direct bilateral inguinal hernias. The right inguinal hernia contains a small amount of the edema tracking through the omentum. IMPRESSION: 1. Considerable free intraperitoneal gas. Exact site of perforation is uncertain but there appear to be inflammatory findings in the vicinity of the tip of the blind-ending loop of ileum from the side to side ileocolic anastomosis, and an adjacent loops of small bowel. Given that inflammation, this might be the site of perforation. The clinical team is aware of the free intraperitoneal gas based on prior radiography and their clinical notes. 2. Inflammatory edema in the mesentery and omentum particularly along the right and central abdomen. 3. Other imaging findings of potential clinical significance: Mild mitral and aortic valve calcifications. Small hypodense hepatic lesions are stable and probably benign. Aortic Atherosclerosis (ICD10-I70.0). Brachytherapy seeds in  the prostate gland. Small bilateral direct inguinal hernias. Electronically Signed   By: Van Clines M.D.   On: 04/14/2019 15:39   Dg Chest Port 1 View  Addendum Date: 04/14/2019   ADDENDUM REPORT: 04/14/2019 12:10 ADDENDUM: These results were called by telephone at the time of interpretation on 04/14/2019 at 12:10 pm to provider [facility] , who verbally acknowledged these results. Electronically Signed   By: Zetta Bills M.D.   On: 04/14/2019 12:10   Result Date: 04/14/2019 CLINICAL DATA:  Abdominal pain, question free air. EXAM: PORTABLE CHEST 1 VIEW COMPARISON:  06/03/2018 FINDINGS: Free air is  noted beneath the right hemidiaphragm. Cardiomediastinal contours are normal. Lungs are clear. Calvin Burnett signs of pleural effusion. Calvin Burnett acute bone finding. IMPRESSION: Findings of free air in the abdomen. Further evaluation with CT may be helpful. At the current time a call is out to the referring provider to discuss findings. Electronically Signed: By: Zetta Bills M.D. On: 04/14/2019 12:05    Microbiology: Recent Results (from the past 240 hour(s))  SARS Coronavirus 2 [facility] order, Performed in [facility] hospital lab) Nasopharyngeal Nasopharyngeal Swab     Status: None   Collection Time: 04/14/19 12:38 PM   Specimen: Nasopharyngeal Swab  Result Value Ref Range Status   SARS Coronavirus 2 NEGATIVE NEGATIVE Final    Comment: (NOTE) If result is NEGATIVE SARS-CoV-2 target nucleic acids are NOT DETECTED. The SARS-CoV-2 RNA is generally detectable in upper and lower  respiratory specimens during the acute phase of infection. The lowest  concentration of SARS-CoV-2 viral copies this assay can detect is 250  copies / mL. A negative result does not preclude SARS-CoV-2 infection  and should not be used as the sole basis for treatment or other  patient management decisions.  A negative result may occur with  improper specimen collection / handling, submission of specimen other  than nasopharyngeal swab, presence of viral mutation(s) within the  areas targeted by this assay, and inadequate number of viral copies  (<250 copies / mL). A negative result must be combined with clinical  observations, patient history, and epidemiological information. If result is POSITIVE SARS-CoV-2 target nucleic acids are DETECTED. The SARS-CoV-2 RNA is generally detectable in upper and lower  respiratory specimens dur ing the acute phase of infection.  Positive  results are indicative of active infection with SARS-CoV-2.  Clinical  correlation with patient history and other diagnostic information is  necessary to  determine patient infection status.  Positive results do  not rule out bacterial infection or co-infection with other viruses. If result is PRESUMPTIVE POSTIVE SARS-CoV-2 nucleic acids MAY BE PRESENT.   A presumptive positive result was obtained on the submitted specimen  and confirmed on repeat testing.  While 2019 novel coronavirus  (SARS-CoV-2) nucleic acids may be present in the submitted sample  additional confirmatory testing may be necessary for epidemiological  and / or clinical management purposes  to differentiate between  SARS-CoV-2 and other Sarbecovirus currently known to infect humans.  If clinically indicated additional testing with an alternate test  methodology [phone]) is advised. The SARS-CoV-2 RNA is generally  detectable in upper and lower respiratory sp ecimens during the acute  phase of infection. The expected result is Negative. Fact Sheet for Patients:  StrictlyIdeas.Calvin Burnett Fact Sheet for Healthcare Providers: BankingDealers.co.za This test is not yet approved or cleared by the [place] FDA and has been authorized for detection and/or diagnosis of SARS-CoV-2 by FDA under an Emergency Use Authorization (EUA).  This  EUA will remain in effect (meaning this test can be used) for the duration of the COVID-19 declaration under Section 564(b)(1) of the Act, 21 U.S.C. section 360bbb-3(b)(1), unless the authorization is terminated or revoked sooner. Performed at [facility], [place] [address] [place], [place] [address]      Labs: Basic Metabolic Panel: Recent Labs  Lab 04/15/19 0124 04/16/19 0838 04/18/19 0430 04/19/19 0351 04/20/19 0304  NA 137 141 136 137 138  K 3.4* 3.4* 2.9* 3.3* 3.3*  CL 104 108 105 107 107  CO2 21* 23 20* 21* 21*  GLUCOSE 149* 156* 115* 130* 118*  BUN 22 28* 15 11 12   CREATININE 1.82* 1.62* 1.36* 1.28* 1.36*  CALCIUM 7.7* 7.3* 7.1* 7.6* 7.4*   Liver Function Tests: Recent Labs   Lab 04/14/19 1237  AST 32  ALT 30  ALKPHOS 82  BILITOT 2.1*  PROT 7.8  ALBUMIN 3.9   Recent Labs  Lab 04/14/19 1237  LIPASE 22   Calvin Burnett results for input(s): AMMONIA in the last 168 hours. CBC: Recent Labs  Lab 04/14/19 1237 04/15/19 0124 04/17/19 0407 04/18/19 0430 04/19/19 0351 04/20/19 0304  WBC 11.2* 10.3 8.3 9.0 8.8 8.1  NEUTROABS 9.0*  --  6.5 7.0 6.3 4.9  HGB 17.5* 15.5 13.8 13.3 12.9* 12.3*  HCT 49.9 43.3 38.5* 36.7* 36.3* 35.0*  MCV 94.9 92.9 92.8 92.0 91.9 93.1  PLT 128* 107* 126* 147* 172 224   Cardiac Enzymes: Calvin Burnett results for input(s): CKTOTAL, CKMB, CKMBINDEX, TROPONINI in the last 168 hours. BNP: BNP (last 3 results) Calvin Burnett results for input(s): BNP in the last 8760 hours.  ProBNP (last 3 results) Recent Labs    01/29/19 0852  PROBNP 165.0*    CBG: Recent Labs  Lab 04/19/19 0837 04/19/19 1216 04/19/19 1640 04/19/19 2113 04/20/19 0758  GLUCAP 128* 203* 118* 143* 129*       Signed:  Alma Friendly, MD [name] 04/20/2019, 10:33 AM

## 2019-04-22 ENCOUNTER — Telehealth: Payer: Self-pay

## 2019-04-22 NOTE — Telephone Encounter (Signed)
LM for patient to return call for TCM/Hospital Follow up.

## 2019-04-23 ENCOUNTER — Telehealth: Payer: Self-pay

## 2019-04-23 NOTE — Patient Instructions (Signed)
Health Maintenance Due  Topic Date Due  . OPHTHALMOLOGY EXAM  04/07/2019   Depression screen [facility] 2/9 03/11/2019 12/31/2018 07/07/2018  Decreased Interest 0 0 0  Down, Depressed, Hopeless 0 0 0  PHQ - 2 Score 0 0 0  Altered sleeping - 0 -  Tired, decreased energy - 0 -  Change in appetite - 0 -  Feeling bad or failure about yourself  - 0 -  Trouble concentrating - 0 -  Moving slowly or fidgety/restless - 0 -  Suicidal thoughts - 0 -  PHQ-9 Score - 0 -  Difficult doing work/chores - Not difficult at all -  Some recent data might be hidden

## 2019-04-23 NOTE — Progress Notes (Signed)
Patient was res hospitalized over weekend. This visit was for hospital follow up. Patient should not be charged for no show.

## 2019-04-23 NOTE — Telephone Encounter (Signed)
LM for patient to return call for TCM. 

## 2019-04-24 ENCOUNTER — Encounter (HOSPITAL_COMMUNITY): Payer: Self-pay | Admitting: Emergency Medicine

## 2019-04-24 ENCOUNTER — Inpatient Hospital Stay (HOSPITAL_COMMUNITY)
Admission: EM | Admit: 2019-04-24 | Discharge: 2019-05-11 | DRG: 329 | Disposition: A | Discharge status: Home Health | Payer: PPO | Attending: General Surgery | Admitting: General Surgery

## 2019-04-24 ENCOUNTER — Other Ambulatory Visit: Payer: Self-pay

## 2019-04-24 ENCOUNTER — Emergency Department (HOSPITAL_COMMUNITY): Payer: PPO

## 2019-04-24 DIAGNOSIS — K567 Ileus, unspecified: Secondary | ICD-10-CM | POA: Diagnosis not present

## 2019-04-24 DIAGNOSIS — R509 Fever, unspecified: Secondary | ICD-10-CM | POA: Diagnosis not present

## 2019-04-24 DIAGNOSIS — B954 Other streptococcus as the cause of diseases classified elsewhere: Secondary | ICD-10-CM | POA: Diagnosis present

## 2019-04-24 DIAGNOSIS — N183 Chronic kidney disease, stage 3 unspecified: Secondary | ICD-10-CM | POA: Diagnosis present

## 2019-04-24 DIAGNOSIS — K219 Gastro-esophageal reflux disease without esophagitis: Secondary | ICD-10-CM | POA: Diagnosis present

## 2019-04-24 DIAGNOSIS — K651 Peritoneal abscess: Secondary | ICD-10-CM | POA: Diagnosis not present

## 2019-04-24 DIAGNOSIS — Z7901 Long term (current) use of anticoagulants: Secondary | ICD-10-CM

## 2019-04-24 DIAGNOSIS — Z8546 Personal history of malignant neoplasm of prostate: Secondary | ICD-10-CM | POA: Diagnosis not present

## 2019-04-24 DIAGNOSIS — E785 Hyperlipidemia, unspecified: Secondary | ICD-10-CM | POA: Diagnosis present

## 2019-04-24 DIAGNOSIS — Z452 Encounter for adjustment and management of vascular access device: Secondary | ICD-10-CM

## 2019-04-24 DIAGNOSIS — Z7984 Long term (current) use of oral hypoglycemic drugs: Secondary | ICD-10-CM

## 2019-04-24 DIAGNOSIS — I351 Nonrheumatic aortic (valve) insufficiency: Secondary | ICD-10-CM | POA: Diagnosis not present

## 2019-04-24 DIAGNOSIS — Z87448 Personal history of other diseases of urinary system: Secondary | ICD-10-CM

## 2019-04-24 DIAGNOSIS — E1165 Type 2 diabetes mellitus with hyperglycemia: Secondary | ICD-10-CM | POA: Diagnosis not present

## 2019-04-24 DIAGNOSIS — I4891 Unspecified atrial fibrillation: Secondary | ICD-10-CM | POA: Diagnosis present

## 2019-04-24 DIAGNOSIS — E1122 Type 2 diabetes mellitus with diabetic chronic kidney disease: Secondary | ICD-10-CM | POA: Diagnosis not present

## 2019-04-24 DIAGNOSIS — E119 Type 2 diabetes mellitus without complications: Secondary | ICD-10-CM

## 2019-04-24 DIAGNOSIS — K668 Other specified disorders of peritoneum: Secondary | ICD-10-CM | POA: Diagnosis not present

## 2019-04-24 DIAGNOSIS — G309 Alzheimer's disease, unspecified: Secondary | ICD-10-CM | POA: Diagnosis not present

## 2019-04-24 DIAGNOSIS — Z0389 Encounter for observation for other suspected diseases and conditions ruled out: Secondary | ICD-10-CM | POA: Diagnosis not present

## 2019-04-24 DIAGNOSIS — Z85828 Personal history of other malignant neoplasm of skin: Secondary | ICD-10-CM

## 2019-04-24 DIAGNOSIS — Z5309 Procedure and treatment not carried out because of other contraindication: Secondary | ICD-10-CM | POA: Diagnosis not present

## 2019-04-24 DIAGNOSIS — R1310 Dysphagia, unspecified: Secondary | ICD-10-CM

## 2019-04-24 DIAGNOSIS — F329 Major depressive disorder, single episode, unspecified: Secondary | ICD-10-CM | POA: Diagnosis not present

## 2019-04-24 DIAGNOSIS — E1169 Type 2 diabetes mellitus with other specified complication: Secondary | ICD-10-CM | POA: Diagnosis not present

## 2019-04-24 DIAGNOSIS — Z825 Family history of asthma and other chronic lower respiratory diseases: Secondary | ICD-10-CM

## 2019-04-24 DIAGNOSIS — Z20828 Contact with and (suspected) exposure to other viral communicable diseases: Secondary | ICD-10-CM | POA: Diagnosis not present

## 2019-04-24 DIAGNOSIS — Z85038 Personal history of other malignant neoplasm of large intestine: Secondary | ICD-10-CM

## 2019-04-24 DIAGNOSIS — Z0189 Encounter for other specified special examinations: Secondary | ICD-10-CM

## 2019-04-24 DIAGNOSIS — K660 Peritoneal adhesions (postprocedural) (postinfection): Secondary | ICD-10-CM | POA: Diagnosis not present

## 2019-04-24 DIAGNOSIS — I480 Paroxysmal atrial fibrillation: Secondary | ICD-10-CM | POA: Diagnosis present

## 2019-04-24 DIAGNOSIS — Z79899 Other long term (current) drug therapy: Secondary | ICD-10-CM

## 2019-04-24 DIAGNOSIS — C61 Malignant neoplasm of prostate: Secondary | ICD-10-CM | POA: Diagnosis present

## 2019-04-24 DIAGNOSIS — Z888 Allergy status to other drugs, medicaments and biological substances status: Secondary | ICD-10-CM

## 2019-04-24 DIAGNOSIS — Z9049 Acquired absence of other specified parts of digestive tract: Secondary | ICD-10-CM

## 2019-04-24 DIAGNOSIS — N1831 Chronic kidney disease, stage 3a: Secondary | ICD-10-CM | POA: Diagnosis present

## 2019-04-24 DIAGNOSIS — K429 Umbilical hernia without obstruction or gangrene: Secondary | ICD-10-CM | POA: Diagnosis present

## 2019-04-24 DIAGNOSIS — Z4682 Encounter for fitting and adjustment of non-vascular catheter: Secondary | ICD-10-CM | POA: Diagnosis not present

## 2019-04-24 DIAGNOSIS — F0390 Unspecified dementia without behavioral disturbance: Secondary | ICD-10-CM | POA: Diagnosis present

## 2019-04-24 DIAGNOSIS — K631 Perforation of intestine (nontraumatic): Secondary | ICD-10-CM | POA: Diagnosis not present

## 2019-04-24 DIAGNOSIS — K630 Abscess of intestine: Secondary | ICD-10-CM | POA: Diagnosis present

## 2019-04-24 DIAGNOSIS — E8770 Fluid overload, unspecified: Secondary | ICD-10-CM | POA: Diagnosis not present

## 2019-04-24 DIAGNOSIS — I10 Essential (primary) hypertension: Secondary | ICD-10-CM | POA: Diagnosis not present

## 2019-04-24 DIAGNOSIS — R7881 Bacteremia: Secondary | ICD-10-CM | POA: Diagnosis present

## 2019-04-24 DIAGNOSIS — Z4659 Encounter for fitting and adjustment of other gastrointestinal appliance and device: Secondary | ICD-10-CM

## 2019-04-24 DIAGNOSIS — I361 Nonrheumatic tricuspid (valve) insufficiency: Secondary | ICD-10-CM | POA: Diagnosis not present

## 2019-04-24 DIAGNOSIS — Z833 Family history of diabetes mellitus: Secondary | ICD-10-CM

## 2019-04-24 DIAGNOSIS — I129 Hypertensive chronic kidney disease with stage 1 through stage 4 chronic kidney disease, or unspecified chronic kidney disease: Secondary | ICD-10-CM | POA: Diagnosis not present

## 2019-04-24 DIAGNOSIS — Z801 Family history of malignant neoplasm of trachea, bronchus and lung: Secondary | ICD-10-CM

## 2019-04-24 DIAGNOSIS — F0280 Dementia in other diseases classified elsewhere without behavioral disturbance: Secondary | ICD-10-CM | POA: Diagnosis not present

## 2019-04-24 LAB — URINALYSIS, ROUTINE W REFLEX MICROSCOPIC
Bilirubin Urine: NEGATIVE
Glucose, UA: NEGATIVE mg/dL
Ketones, ur: NEGATIVE mg/dL
Leukocytes,Ua: NEGATIVE
Nitrite: NEGATIVE
Protein, ur: >=300 mg/dL — AB
Specific Gravity, Urine: 1.022 (ref 1.005–1.030)
pH: 5 (ref 5.0–8.0)

## 2019-04-24 LAB — CBC
HCT: 39.8 % (ref 39.0–52.0)
Hemoglobin: 14.1 g/dL (ref 13.0–17.0)
MCH: 32.7 pg (ref 26.0–34.0)
MCHC: 35.4 g/dL (ref 30.0–36.0)
MCV: 92.3 fL (ref 80.0–100.0)
Platelets: 458 10*3/uL — ABNORMAL HIGH (ref 150–400)
RBC: 4.31 MIL/uL (ref 4.22–5.81)
RDW: 13.2 % (ref 11.5–15.5)
WBC: 17.9 10*3/uL — ABNORMAL HIGH (ref 4.0–10.5)
nRBC: 0 % (ref 0.0–0.2)

## 2019-04-24 LAB — COMPREHENSIVE METABOLIC PANEL
ALT: 25 U/L (ref 0–44)
AST: 26 U/L (ref 15–41)
Albumin: 2.4 g/dL — ABNORMAL LOW (ref 3.5–5.0)
Alkaline Phosphatase: 91 U/L (ref 38–126)
Anion gap: 11 (ref 5–15)
BUN: 12 mg/dL (ref 8–23)
CO2: 20 mmol/L — ABNORMAL LOW (ref 22–32)
Calcium: 8 mg/dL — ABNORMAL LOW (ref 8.9–10.3)
Chloride: 101 mmol/L (ref 98–111)
Creatinine, Ser: 1.29 mg/dL — ABNORMAL HIGH (ref 0.61–1.24)
GFR calc Af Amer: 59 mL/min — ABNORMAL LOW (ref >60)
GFR calc non Af Amer: 51 mL/min — ABNORMAL LOW (ref >60)
Glucose, Bld: 173 mg/dL — ABNORMAL HIGH (ref 70–99)
Potassium: 3.5 mmol/L (ref 3.5–5.1)
Sodium: 132 mmol/L — ABNORMAL LOW (ref 135–145)
Total Bilirubin: 1.1 mg/dL (ref 0.3–1.2)
Total Protein: 6.1 g/dL — ABNORMAL LOW (ref 6.5–8.1)

## 2019-04-24 LAB — PROTIME-INR
INR: 2 — ABNORMAL HIGH (ref 0.8–1.2)
Prothrombin Time: 22.5 seconds — ABNORMAL HIGH (ref 11.4–15.2)

## 2019-04-24 LAB — APTT: aPTT: 42 seconds — ABNORMAL HIGH (ref 24–36)

## 2019-04-24 LAB — LIPASE, BLOOD: Lipase: 42 U/L (ref 11–51)

## 2019-04-24 LAB — LACTIC ACID, PLASMA: Lactic Acid, Venous: 1.5 mmol/L (ref 0.5–1.9)

## 2019-04-24 MED ORDER — SODIUM CHLORIDE 0.9 % IV SOLN: 2.0000 g | INTRAVENOUS | Status: DC

## 2019-04-24 MED ORDER — SODIUM CHLORIDE 0.9% FLUSH
3.0000 mL | Freq: Once | INTRAVENOUS | Status: AC
  Administered 2019-04-24: 21:00:00 3 mL via INTRAVENOUS

## 2019-04-24 MED ORDER — IOHEXOL 300 MG/ML  SOLN
100.0000 mL | Freq: Once | INTRAMUSCULAR | Status: AC | PRN
  Administered 2019-04-24: 100 mL via INTRAVENOUS

## 2019-04-24 MED ORDER — SODIUM CHLORIDE 0.9 % IV BOLUS
1000.0000 mL | Freq: Once | INTRAVENOUS | Status: AC
  Administered 2019-04-24: 1000 mL via INTRAVENOUS

## 2019-04-24 MED ORDER — VANCOMYCIN HCL 10 G IV SOLR
1500.0000 mg | Freq: Once | INTRAVENOUS | Status: AC
  Administered 2019-04-25: 1500 mg via INTRAVENOUS
  Filled 2019-04-24: qty 1500

## 2019-04-24 MED ORDER — METRONIDAZOLE IN NACL 5-0.79 MG/ML-% IV SOLN
500.0000 mg | Freq: Once | INTRAVENOUS | Status: AC
  Administered 2019-04-24: 500 mg via INTRAVENOUS
  Filled 2019-04-24: qty 100

## 2019-04-24 MED ORDER — SODIUM CHLORIDE 0.9 % IV SOLN
2.0000 g | Freq: Once | INTRAVENOUS | Status: AC
  Administered 2019-04-24: 2 g via INTRAVENOUS
  Filled 2019-04-24: qty 2

## 2019-04-24 NOTE — ED Triage Notes (Signed)
Pt c/o abdominal tenderness and fever. Hx dementia, daughter reports he had a temp of 102.4, took 2 acetaminophen, temp in triage 100.5. Recent admission for diverticulitis.

## 2019-04-24 NOTE — Progress Notes (Signed)
Pharmacy Antibiotic Note  Calvin Burnett is a 85 y.o. male admitted on 04/24/2019 with intraabdominal infection.  Pharmacy has been consulted for cefepime dosing.  Plan: Cefepime 2g IV q 24 hrs. F/u renal function, clinical course.    Temp (24hrs), Avg:99.2 F (37.3 C), Min:97.8 F (36.6 C), Max:100.5 F (38.1 C)  Recent Labs  Lab 04/18/19 0430 04/19/19 0351 04/20/19 0304 04/24/19 1848 04/24/19 2100  WBC 9.0 8.8 8.1 17.9*  --   CREATININE 1.36* 1.28* 1.36* 1.29*  --   LATICACIDVEN  --   --   --   --  1.5    Estimated Creatinine Clearance: 44.8 mL/min (A) (by C-G formula based on SCr of 1.29 mg/dL (H)).    Allergies  Allergen Reactions  . Kcentra [Prothrombin Complex Conc Human] Anaphylaxis    Patient immediately became short of breath and bright red. Started improving minutes after infusion stopped.    Thank you for allowing pharmacy to be a part of this patient's care.  Marguerite Olea, [facility] Clinical Pharmacist Phone [phone]  04/24/2019 10:13 PM
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
## 2019-04-25 ENCOUNTER — Inpatient Hospital Stay (HOSPITAL_COMMUNITY): Payer: PPO

## 2019-04-25 DIAGNOSIS — I480 Paroxysmal atrial fibrillation: Secondary | ICD-10-CM

## 2019-04-25 LAB — CBG MONITORING, ED
Glucose-Capillary: 118 mg/dL — ABNORMAL HIGH (ref 70–99)
Glucose-Capillary: 114 mg/dL — ABNORMAL HIGH (ref 70–99)
Glucose-Capillary: 86 mg/dL (ref 70–99)

## 2019-04-25 LAB — SARS CORONAVIRUS 2 (TAT 6-24 HRS): SARS Coronavirus 2: NEGATIVE

## 2019-04-25 LAB — COMPREHENSIVE METABOLIC PANEL
ALT: 20 U/L (ref 0–44)
AST: 19 U/L (ref 15–41)
Albumin: 2 g/dL — ABNORMAL LOW (ref 3.5–5.0)
Alkaline Phosphatase: 81 U/L (ref 38–126)
Anion gap: 10 (ref 5–15)
BUN: 15 mg/dL (ref 8–23)
CO2: 22 mmol/L (ref 22–32)
Calcium: 7.2 mg/dL — ABNORMAL LOW (ref 8.9–10.3)
Chloride: 103 mmol/L (ref 98–111)
Creatinine, Ser: 1.27 mg/dL — ABNORMAL HIGH (ref 0.61–1.24)
GFR calc Af Amer: >60 mL/min (ref >60)
GFR calc non Af Amer: 52 mL/min — ABNORMAL LOW (ref >60)
Glucose, Bld: 142 mg/dL — ABNORMAL HIGH (ref 70–99)
Potassium: 3.3 mmol/L — ABNORMAL LOW (ref 3.5–5.1)
Sodium: 135 mmol/L (ref 135–145)
Total Bilirubin: 1.7 mg/dL — ABNORMAL HIGH (ref 0.3–1.2)
Total Protein: 5.3 g/dL — ABNORMAL LOW (ref 6.5–8.1)

## 2019-04-25 LAB — GLUCOSE, CAPILLARY
Glucose-Capillary: 90 mg/dL (ref 70–99)
Glucose-Capillary: 176 mg/dL — ABNORMAL HIGH (ref 70–99)
Glucose-Capillary: 170 mg/dL — ABNORMAL HIGH (ref 70–99)

## 2019-04-25 LAB — CBC
HCT: 35.5 % — ABNORMAL LOW (ref 39.0–52.0)
Hemoglobin: 12.8 g/dL — ABNORMAL LOW (ref 13.0–17.0)
MCH: 33.6 pg (ref 26.0–34.0)
MCHC: 36.1 g/dL — ABNORMAL HIGH (ref 30.0–36.0)
MCV: 93.2 fL (ref 80.0–100.0)
Platelets: 406 10*3/uL — ABNORMAL HIGH (ref 150–400)
RBC: 3.81 MIL/uL — ABNORMAL LOW (ref 4.22–5.81)
RDW: 13.4 % (ref 11.5–15.5)
WBC: 17.9 10*3/uL — ABNORMAL HIGH (ref 4.0–10.5)
nRBC: 0 % (ref 0.0–0.2)

## 2019-04-25 MED ORDER — FENTANYL CITRATE (PF) 100 MCG/2ML IJ SOLN
INTRAMUSCULAR | Status: AC
  Filled 2019-04-25: qty 2

## 2019-04-25 MED ORDER — AMLODIPINE BESYLATE 5 MG PO TABS
5.0000 mg | ORAL_TABLET | Freq: Every day | ORAL | Status: DC
  Administered 2019-04-25: 10:00:00 5 mg via ORAL
  Filled 2019-04-25 (×2): qty 1

## 2019-04-25 MED ORDER — LIDOCAINE HCL 1 % IJ SOLN
INTRAMUSCULAR | Status: AC
  Filled 2019-04-25: qty 20

## 2019-04-25 MED ORDER — INSULIN ASPART 100 UNIT/ML ~~LOC~~ SOLN
0.0000 [IU] | Freq: Three times a day (TID) | SUBCUTANEOUS | Status: DC
  Administered 2019-04-26: 1 [IU] via SUBCUTANEOUS
  Administered 2019-04-27: 2 [IU] via SUBCUTANEOUS
  Administered 2019-04-28: 1 [IU] via SUBCUTANEOUS

## 2019-04-25 MED ORDER — ONDANSETRON HCL 4 MG/2ML IJ SOLN: 4.0000 mg | Freq: Four times a day (QID) | INTRAMUSCULAR | Status: DC | PRN

## 2019-04-25 MED ORDER — SODIUM CHLORIDE 0.9 % IV BOLUS
1000.0000 mL | Freq: Once | INTRAVENOUS | Status: AC
  Administered 2019-04-25: 1000 mL via INTRAVENOUS

## 2019-04-25 MED ORDER — PANTOPRAZOLE SODIUM 40 MG IV SOLR
40.0000 mg | Freq: Every day | INTRAVENOUS | Status: DC
  Administered 2019-04-25 – 2019-05-03 (×9): 40 mg via INTRAVENOUS
  Filled 2019-04-25 (×9): qty 40

## 2019-04-25 MED ORDER — ONDANSETRON HCL 4 MG PO TABS: 4.0000 mg | ORAL_TABLET | Freq: Four times a day (QID) | ORAL | Status: DC | PRN

## 2019-04-25 MED ORDER — METOPROLOL SUCCINATE ER 25 MG PO TB24
12.5000 mg | ORAL_TABLET | Freq: Every day | ORAL | Status: DC
  Administered 2019-04-25 – 2019-04-27 (×3): 12.5 mg via ORAL
  Filled 2019-04-25 (×3): qty 1

## 2019-04-25 MED ORDER — SODIUM CHLORIDE 0.9 % IV SOLN
2.0000 g | INTRAVENOUS | Status: DC
  Administered 2019-04-25: 2 g via INTRAVENOUS
  Filled 2019-04-25 (×2): qty 20

## 2019-04-25 MED ORDER — MEMANTINE HCL ER 28 MG PO CP24
28.0000 mg | ORAL_CAPSULE | Freq: Every day | ORAL | Status: DC
  Administered 2019-04-25 – 2019-04-26 (×2): 28 mg via ORAL
  Filled 2019-04-25 (×4): qty 1

## 2019-04-25 MED ORDER — MORPHINE SULFATE (PF) 2 MG/ML IV SOLN: 2.0000 mg | INTRAVENOUS | Status: DC | PRN

## 2019-04-25 MED ORDER — SODIUM CHLORIDE 0.9% FLUSH
5.0000 mL | Freq: Three times a day (TID) | INTRAVENOUS | Status: DC
  Administered 2019-04-25 – 2019-04-27 (×5): 5 mL

## 2019-04-25 MED ORDER — MIDAZOLAM HCL 2 MG/2ML IJ SOLN
INTRAMUSCULAR | Status: AC | PRN
  Administered 2019-04-25: 1 mg via INTRAVENOUS
  Administered 2019-04-25: 0.5 mg via INTRAVENOUS

## 2019-04-25 MED ORDER — FENTANYL CITRATE (PF) 100 MCG/2ML IJ SOLN
INTRAMUSCULAR | Status: AC | PRN
  Administered 2019-04-25: 25 ug via INTRAVENOUS
  Administered 2019-04-25: 50 ug via INTRAVENOUS

## 2019-04-25 MED ORDER — VANCOMYCIN HCL 10 G IV SOLR
1250.0000 mg | INTRAVENOUS | Status: DC
  Administered 2019-04-25 – 2019-04-26 (×2): 1250 mg via INTRAVENOUS
  Filled 2019-04-25 (×3): qty 1250

## 2019-04-25 MED ORDER — ACETAMINOPHEN 500 MG PO TABS
500.0000 mg | ORAL_TABLET | Freq: Every day | ORAL | Status: DC | PRN
  Administered 2019-04-25: 16:00:00 500 mg via ORAL
  Filled 2019-04-25 (×2): qty 1

## 2019-04-25 MED ORDER — MIDAZOLAM HCL 2 MG/2ML IJ SOLN
INTRAMUSCULAR | Status: AC
  Filled 2019-04-25: qty 2

## 2019-04-25 MED ORDER — POTASSIUM CHLORIDE CRYS ER 20 MEQ PO TBCR
40.0000 meq | EXTENDED_RELEASE_TABLET | Freq: Every day | ORAL | Status: AC
  Administered 2019-04-25 – 2019-04-26 (×2): 40 meq via ORAL
  Filled 2019-04-25 (×2): qty 2

## 2019-04-25 MED ORDER — LATANOPROST 0.005 % OP SOLN
1.0000 [drp] | Freq: Every day | OPHTHALMIC | Status: DC
  Administered 2019-04-25 – 2019-05-10 (×16): 1 [drp] via OPHTHALMIC
  Filled 2019-04-25 (×2): qty 2.5

## 2019-04-25 MED ORDER — SODIUM CHLORIDE 0.9 % IV SOLN
2.0000 g | Freq: Two times a day (BID) | INTRAVENOUS | Status: DC
  Administered 2019-04-25 – 2019-04-27 (×4): 2 g via INTRAVENOUS
  Filled 2019-04-25 (×5): qty 2

## 2019-04-25 MED ORDER — METRONIDAZOLE IN NACL 5-0.79 MG/ML-% IV SOLN
500.0000 mg | Freq: Three times a day (TID) | INTRAVENOUS | Status: DC
  Administered 2019-04-25 – 2019-04-27 (×7): 500 mg via INTRAVENOUS
  Filled 2019-04-25 (×7): qty 100

## 2019-04-25 MED ORDER — SODIUM CHLORIDE 0.9 % IV SOLN
INTRAVENOUS | Status: DC
  Administered 2019-04-25 – 2019-04-27 (×7): via INTRAVENOUS

## 2019-04-25 NOTE — ED Notes (Signed)
Clear liquid breakfast tray ordered.

## 2019-04-25 NOTE — Progress Notes (Signed)
Pharmacy Antibiotic Note  Calvin Burnett is a 85 y.o. male admitted on 04/24/2019 with abdominal pain, found to have intra-abdominal abscesses.  Patient was started on vancomycin, Flagyl and cefepime.  Cefepime then narrowed to Rocephin.  Pharmacy now consulted for cefepime dosing.  SCr 1.27, CrCL 46 ml/min, Tmax 100.5, WBC 17.9, LA 1.5.  Plan: Cefepime 2gm IV Q12H Monitor renal fxn, clinical progress    Temp (24hrs), Avg:99.2 F (37.3 C), Min:97.8 F (36.6 C), Max:100.5 F (38.1 C)  Recent Labs  Lab 04/19/19 0351 04/20/19 0304 04/24/19 1848 04/24/19 2100 04/25/19 0308  WBC 8.8 8.1 17.9*  --  17.9*  CREATININE 1.28* 1.36* 1.29*  --  1.27*  LATICACIDVEN  --   --   --  1.5  --     Estimated Creatinine Clearance: 45.5 mL/min (A) (by C-G formula based on SCr of 1.27 mg/dL (H)).    Allergies  Allergen Reactions  . Kcentra [Prothrombin Complex Conc Human] Anaphylaxis    Patient immediately became short of breath and bright red. Started improving minutes after infusion stopped.    Vanc 10/11 >> Cefepime 10/10 >> 10/11, restarted 10/11 >> CTX 10/11 >> Flagyl 10/10 >>  10/10 UCx -  10/10 BCx -  10/11 covid - negative  [name] D. Mina Marble, PharmD, BCPS, [place] 04/25/2019, 1:40 PM

## 2019-04-25 NOTE — Progress Notes (Signed)
[name] PROGRESS NOTE  Calvin Burnett [id number] DOB: June 19, 1934 DOA: 04/24/2019 PCP: Marin Olp, MD  Brief summary   Calvin Burnett is a 85 y.o. male with medical history significant of diabetes, GERD, hypertension and prostate cancer who was recently admitted with intra-abdominal abscess due to perforated viscus from April 13, 2009 discharged home on antibiotics, history of A. fib, currently on Eliquis.  Hypertension GERD and Alzheimer's dementia, history of right-sided colectomy and sigmoidoscopy in May 2019, pneumoperitoneum dry on last admission who presented again with abdominal pain as well as fever.  Patient had temperature up to 102.  He was on Eliquis and no surgery was done previous admission.  His abscess was deemed not drainable at the time.  He was reevaluated today and found to have worsening intra-abdominal abscess.  Patient has not been on antibiotics in the last few days.  He is being readmitted to the hospital due to worsening intra-abdominal abscess...  ED Course: Temperature 100.5 blood pressure 100/67 pulse 98 respiratory 24 oxygen sat 95% on room air.  Sodium 135 potassium 3.3 chloride 103 CO2 22 glucose 142.  Creatinine 1.27 calcium 7.2.  Albumin 2.0.  White count 17.9 platelet lites 406 and hemoglobin 12.8.  COVID-19 screen is negative.  CT abdomen pelvis shows interval development of multiple intra-abdominal abscesses measure approximately 6.2 x 5.2 x 7.1 cm.  Multiple thick-walled loops of small bowel in the mid abdomen small volume pneumoperitoneum which is decreased from previous small bilateral pleural effusions with bladder wall thickening.  Surgery is consulted and patient is being admitted to the hospital  Assessment/Plan:  Intra-abdominal abscesses: recurrent infections. Ct abd: Interval development of multiple intra-abdominal abscesses as detailed above measuring up to approximately 6.2 x 5.2 by 7.1 cm. -cont iv antibiotics cefepime,  vancomycin and Flagyl. F/u culture, intraabdominal drainage culture. IR drainage once patient's Eliquis is washed out.  -surgery if following. Continue pain management  Diabetes: monitor on sliding scale insulin.  Pneumoperitoneum: Much decreased on repeat ct.   cont per surgical recommendations  Dementia: Stable but mild confusion.  Chronic kidney disease stage III: Also at baseline.  Continue monitoring  Paroxysmal atrial fibrillation: Rate is controlled.  In sinus rhythm.  Hold Eliquis. Resume post procedure   Hypertension: Continue home blood pressure regimen   Code Status: full Family Communication: d/w patient, his family. RN (indicate person spoken with, relationship, and if by phone, the number) Disposition Plan: remains inpatient    Consultants:  Surgery  IR  Procedures:  Pend IR  Antibiotics: Anti-infectives (From admission, onward)   Start     Dose/Rate Route Frequency Ordered Stop   04/25/19 2200  vancomycin (VANCOCIN) 1,250 mg in sodium chloride 0.9 % 250 mL IVPB     1,250 mg 166.7 mL/hr over 90 Minutes Intravenous Every 24 hours 04/25/19 0158     04/25/19 2100  ceFEPIme (MAXIPIME) 2 g in sodium chloride 0.9 % 100 mL IVPB  Status:  Discontinued     2 g 200 mL/hr over 30 Minutes Intravenous Every 24 hours 04/24/19 2213 04/25/19 0900   04/25/19 1000  cefTRIAXone (ROCEPHIN) 2 g in sodium chloride 0.9 % 100 mL IVPB     2 g 200 mL/hr over 30 Minutes Intravenous Every 24 hours 04/25/19 0841     04/25/19 0015  metroNIDAZOLE (FLAGYL) IVPB 500 mg     500 mg 100 mL/hr over 60 Minutes Intravenous Every 8 hours 04/25/19 0006     04/25/19 0000  vancomycin (VANCOCIN) 1,500  mg in sodium chloride 0.9 % 500 mL IVPB     1,500 mg 250 mL/hr over 120 Minutes Intravenous  Once 04/24/19 2355 04/25/19 0248   04/24/19 2215  ceFEPIme (MAXIPIME) 2 g in sodium chloride 0.9 % 100 mL IVPB  Status:  Discontinued     2 g 200 mL/hr over 30 Minutes Intravenous Every 24 hours  04/24/19 2211 04/24/19 2213   04/24/19 2100  ceFEPIme (MAXIPIME) 2 g in sodium chloride 0.9 % 100 mL IVPB     2 g 200 mL/hr over 30 Minutes Intravenous  Once 04/24/19 2046 04/24/19 2154   04/24/19 2100  metroNIDAZOLE (FLAGYL) IVPB 500 mg     500 mg 100 mL/hr over 60 Minutes Intravenous  Once 04/24/19 2046 04/24/19 2223        (indicate start date, and stop date if known)  HPI/Subjective: He reports feeling better. No acute distress. Awaiting IR drainage. No vomiting. He reports bl leg edema.   Objective: Vitals:   04/25/19 1130 04/25/19 1145  BP: 123/74 118/68  Pulse: 89 89  Resp: (!) 22 (!) 23  Temp:    SpO2: 96% 96%    Intake/Output Summary (Last 24 hours) at 04/25/2019 1325 Last data filed at 04/25/2019 0006 Gross per 24 hour  Intake 1100 ml  Output -  Net 1100 ml   There were no vitals filed for this visit.  Exam:   General:  No distress   Cardiovascular: s1,s2 rrr  Respiratory: CTA BL  Abdomen: soft, nt   Musculoskeletal: mild pedal edema    Data Reviewed: Basic Metabolic Panel: Recent Labs  Lab 04/19/19 0351 04/20/19 0304 04/24/19 1848 04/25/19 0308  NA 137 138 132* 135  K 3.3* 3.3* 3.5 3.3*  CL 107 107 101 103  CO2 21* 21* 20* 22  GLUCOSE 130* 118* 173* 142*  BUN 11 12 12 15   CREATININE 1.28* 1.36* 1.29* 1.27*  CALCIUM 7.6* 7.4* 8.0* 7.2*   Liver Function Tests: Recent Labs  Lab 04/24/19 1848 04/25/19 0308  AST 26 19  ALT 25 20  ALKPHOS 91 81  BILITOT 1.1 1.7*  PROT 6.1* 5.3*  ALBUMIN 2.4* 2.0*   Recent Labs  Lab 04/24/19 1848  LIPASE 42   No results for input(s): AMMONIA in the last 168 hours. CBC: Recent Labs  Lab 04/19/19 0351 04/20/19 0304 04/24/19 1848 04/25/19 0308  WBC 8.8 8.1 17.9* 17.9*  NEUTROABS 6.3 4.9  --   --   HGB 12.9* 12.3* 14.1 12.8*  HCT 36.3* 35.0* 39.8 35.5*  MCV 91.9 93.1 92.3 93.2  PLT 172 224 458* 406*   Cardiac Enzymes: No results for input(s): CKTOTAL, CKMB, CKMBINDEX, TROPONINI in the  last 168 hours. BNP (last 3 results) No results for input(s): BNP in the last 8760 hours.  ProBNP (last 3 results) Recent Labs    01/29/19 0852  PROBNP 165.0*    CBG: Recent Labs  Lab 04/20/19 0758 04/20/19 1219 04/25/19 0726 04/25/19 0815 04/25/19 1225  GLUCAP 129* 143* 118* 114* 86    Recent Results (from the past 240 hour(s))  SARS CORONAVIRUS 2 (TAT 6-24 HRS) Nasopharyngeal Nasopharyngeal Swab     Status: None   Collection Time: 04/25/19 12:11 AM   Specimen: Nasopharyngeal Swab  Result Value Ref Range Status   SARS Coronavirus 2 NEGATIVE NEGATIVE Final    Comment: (NOTE) SARS-CoV-2 target nucleic acids are NOT DETECTED. The SARS-CoV-2 RNA is generally detectable in upper and lower respiratory specimens during the acute phase of  infection. Negative results do not preclude SARS-CoV-2 infection, do not rule out co-infections with other pathogens, and should not be used as the sole basis for treatment or other patient management decisions. Negative results must be combined with clinical observations, patient history, and epidemiological information. The expected result is Negative. Fact Sheet for Patients: SugarRoll.be Fact Sheet for Healthcare Providers: https://www.woods-mathews.com/ This test is not yet approved or cleared by the [place] FDA and  has been authorized for detection and/or diagnosis of SARS-CoV-2 by FDA under an Emergency Use Authorization (EUA). This EUA will remain  in effect (meaning this test can be used) for the duration of the COVID-19 declaration under Section 56 4(b)(1) of the Act, 21 U.S.C. section 360bbb-3(b)(1), unless the authorization is terminated or revoked sooner. Performed at [facility], [place] [address] [place], [place] [address]      Studies: Ct Abdomen Pelvis W Contrast  Result Date: 04/24/2019 CLINICAL DATA:  Abdominal pain with diverticulitis suspected. EXAM: CT  ABDOMEN AND PELVIS WITH CONTRAST TECHNIQUE: Multidetector CT imaging of the abdomen and pelvis was performed using the standard protocol following bolus administration of intravenous contrast. CONTRAST:  139mL OMNIPAQUE IOHEXOL 300 MG/ML  SOLN COMPARISON:  04/14/2019 CT. FINDINGS: Lower chest: There are small bilateral pleural effusions, right greater than left. There is atelectasis at the lung bases. The heart size is mildly enlarged. Hepatobiliary: The liver is normal. Status post cholecystectomy.There is no biliary ductal dilation. Pancreas: Normal contours without ductal dilatation. No peripancreatic fluid collection. Spleen: No splenic laceration or hematoma. Adrenals/Urinary Tract: --Adrenal glands: No adrenal hemorrhage. --Right kidney/ureter: No hydronephrosis or perinephric hematoma. --Left kidney/ureter: No hydronephrosis or perinephric hematoma. --Urinary bladder: There is mild bladder wall thickening. This is likely reactive. Stomach/Bowel: --Stomach/Duodenum: No hiatal hernia or other gastric abnormality. Normal duodenal course and caliber. --Small bowel: There are few mildly prominent loops of small bowel scattered throughout the abdomen without evidence for high-grade small bowel obstruction. There are multiple inflamed loops of small bowel in the mid abdomen (axial series 3, image 50). Adjacent to these small bowel loops there is an abscess measuring approximately 5.1 x 6.2 by 7.1 cm. This abscess appears to extend to the right across the patient's midline and possibly connect to several additional smaller abscesses measuring up to approximately 1.7 cm and 3 x 5.6 cm there is a smaller developing abscess in the right lower quadrant measuring approximately 4.3 x 2 cm. --Colon: Again noted is a surgical anastomosis at the rectosigmoid junction. There are few scattered colonic diverticula. The patient is status post right hemicolectomy. --Appendix: Surgically absent. Vascular/Lymphatic: Atherosclerotic  calcification is present within the non-aneurysmal abdominal aorta, without hemodynamically significant stenosis. There is a circumaortic left renal vein, a normal variant. --No retroperitoneal lymphadenopathy. --No mesenteric lymphadenopathy. --there are enlarged reactive mesenteric lymph nodes. Extensive inflammatory changes are noted in the patient's mesentery. Reproductive: Brachytherapy beads are noted. Other: There is a significant interval reduction in volume of pneumoperitoneum. There are a few residual pockets of free air in the peritoneal cavity. There is a small fat containing periumbilical hernia. Musculoskeletal. No acute displaced fractures. IMPRESSION: 1. Interval development of multiple intra-abdominal abscesses as detailed above measuring up to approximately 6.2 x 5.2 by 7.1 cm. The largest abscess is amenable to percutaneous drainage as clinically indicated. 2. Multiple thick walled matted loops of small bowel in the mid abdomen, likely the source of the patient's bowel perforation. These areas are suboptimally evaluated in the absence of oral contrast. 3. Small volume  pneumoperitoneum, decreased from prior study. 4. Small bilateral pleural effusions with adjacent atelectasis. 5. Mild bladder wall thickening, likely reactive. Electronically Signed   By: Constance Holster M.D.   On: 04/24/2019 23:31   Dg Chest Port 1 View  Result Date: 04/24/2019 CLINICAL DATA:  Fever. EXAM: PORTABLE CHEST 1 VIEW COMPARISON:  04/14/2019 FINDINGS: The heart size is enlarged. This is stable from prior study. There is no pneumothorax. No large pleural effusion. No free air under the hemidiaphragms. No focal infiltrate. No acute osseous abnormality. IMPRESSION: No active disease. Electronically Signed   By: Constance Holster M.D.   On: 04/24/2019 21:14    Scheduled Meds: . amLODipine  5 mg Oral Daily  . insulin aspart  0-9 Units Subcutaneous TID WC  . latanoprost  1 drop Both Eyes QHS  . memantine  28 mg  Oral Daily  . metoprolol succinate  12.5 mg Oral Daily  . pantoprazole (PROTONIX) IV  40 mg Intravenous Daily   Continuous Infusions: . sodium chloride 125 mL/hr at 04/25/19 1305  . cefTRIAXone (ROCEPHIN)  IV 2 g (04/25/19 1006)  . metronidazole 500 mg (04/25/19 1307)  . vancomycin      Principal Problem:   Intra-abdominal abscess ([name]) Active Problems:   Essential hypertension   GERD (gastroesophageal reflux disease)   Type 2 diabetes mellitus ([name])   Prostate cancer ([name])   Hyperlipidemia associated with type 2 diabetes mellitus ([place])   CKD (chronic kidney disease), stage III ([name])   AF (paroxysmal atrial fibrillation) ([place])   Perforation bowel ([place])   Dementia ([place])   Pneumoperitoneum    Time spent: >25 minutes     Kinnie Feil  [name] Pager [phone]. If 7PM-7AM, please contact night-coverage at [email or web address], password [facility] 04/25/2019, 1:25 PM  LOS: 1 day

## 2019-04-25 NOTE — Procedures (Signed)
  Procedure: CT drain LLQ abscess   EBL:   minimal Complications:  none immediate  See full dictation in [employer].  Dillard Cannon MD Main # [phone] Pager  [phone]

## 2019-04-25 NOTE — ED Notes (Signed)
Gen Surg PA at bedside.

## 2019-04-25 NOTE — Progress Notes (Addendum)
Pharmacy Antibiotic Note  Calvin Burnett is a 85 y.o. male admitted on 04/24/2019 with sepsis.  Pharmacy has been consulted for Vancomycin dosing. Already on Cefepime. Possible intra-abdominal source. WBC is up from just a few days ago. Mild renal dysfunction.   Plan: Vancomycin 1500 mg IV x 1, then 1250 mg IV q24h >>Estimated AUC: 521 Already on Cefepime Trend WBC, temp, renal function  F/U infectious work-up Drug levels as indicated  Temp (24hrs), Avg:99.2 F (37.3 C), Min:97.8 F (36.6 C), Max:100.5 F (38.1 C)  Recent Labs  Lab 04/18/19 0430 04/19/19 0351 04/20/19 0304 04/24/19 1848 04/24/19 2100  WBC 9.0 8.8 8.1 17.9*  --   CREATININE 1.36* 1.28* 1.36* 1.29*  --   LATICACIDVEN  --   --   --   --  1.5    Estimated Creatinine Clearance: 44.8 mL/min (A) (by C-G formula based on SCr of 1.29 mg/dL (H)).    Allergies  Allergen Reactions  . Kcentra [Prothrombin Complex Conc Human] Anaphylaxis    Patient immediately became short of breath and bright red. Started improving minutes after infusion stopped.   Narda Bonds, PharmD, BCPS Clinical Pharmacist Phone: [phone]

## 2019-04-25 NOTE — Progress Notes (Addendum)
CC: Recurrent abdominal pain with fever up to 102.4  Subjective: Patient is an 85 year old male with a history of pneumoperitoneum, November 2019, he was hospitalized again 9/30-10/12/2018, again with pneumoperitoneum.  He underwent colonoscopy on 02/10/19 by Dr. Lucio Edward;- The examined portion of the ileum was normal. Patent side-to-side ileo-colonic anastomosis, characterized by healthy appearing mucosa. Patent end-to-end colo-colonic anastomosis, characterized by healthy appearing mucosa.  Diverticulosis in the entire examined colon. During his hospitalization 9/30-10/12/2018 he was treated with Zosyn.  His discomfort resolved and his diet was advanced.  He was discharged on Augmentin.  He thinks he finished the antibiotics and he thinks his last dose of Eliquis was on 04/23/19.  He presented on 04/24/2019, with recurrent abdominal tenderness & fever.  Work-up in the ED showed his temperature was 100.5.  Mild tachycardia heart rate in the 90s, blood pressure was stable.  CBC showed a white count of 17.9, hemoglobin 12.8, hematocrit 35.5, platelets 404,000.  Creatinine 1.27, potassium 3.3 glucose 142, total bilirubin 1.7.  INR is 2.0, PTT is 42, COVID is negative.  Urinalysis positive for protein otherwise negative.   CT of abdomen pelvis with contrast: There are a few mildly prominent loops of small bowel scattered throughout the abdomen without evidence for high-grade small bowel obstruction.  There are multiple inflamed loops of small bowel in the mid abdomen.  Adjacent to the small bowel loops is an abscess measuring 5.1 x 6.2 x 7.1 cm.  The abscess appears to extend across the patient's midline possibly connected several additional smaller abscesses measuring approximately 1.7 x 3 x 5.6 cm, with a third abscess in the right lower quadrant measuring 4.3 x 2 cm.  There is a surgical anastomosis rectosigmoid junction with a few scattered colonic diverticuli, patient status post right  hemicolectomy.  There was a significant interval reduction in the volume of pneumoperitoneum.  With a few residual pockets of free air in the peritoneal cavity and a small fat-containing periumbilical hernia.  Was their impression the multiple thick-walled loops of small bowel in the mid abdomen is likely source of the patient's bowel perforation.  Additional medical problems and clues type 2 diabetes, hypertension, atrial fibrillation on Eliquis, Alzheimer's dementia, GERD.  ROS: CV: Negative; positive for fever, he is not sure of the date. Cards: Negative;  Pulmonary: Negative GI: Positive for tenderness and pain.  He has been tolerating a diet.  He reports normal BMs.  No blood, no nausea or vomiting.  Pain is primarily lower quadrant right greater than left. GU negative Lower extremities: Mild lower extremity swelling last couple days. Psych: No changes.  He lives independently, with his son and daughter coming to assist him as needed.  He does his medicines on his own.   Patient lives independently with his daughter and son checking on him. 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  Objective: Vital signs in last 24 hours: Temp:  [97.8 F (36.6 C)-100.5 F (38.1 C)] 97.8 F (36.6 C) (10/10 2051) Pulse Rate:  [81-98] 91 (10/11 0300) Resp:  [15-24] 22 (10/11 0300) BP: (100-121)/(65-78) 111/78 (10/11 0300) SpO2:  [95 %-100 %] 96 % (10/11 0300)    Intake/Output from previous day: 10/10 0701 - 10/11 0700 In: 1100 [IV Piggyback:1100] Out: -  Intake/Output this shift: No intake/output data recorded.  General appearance: alert, cooperative and no distress Resp: clear to auscultation bilaterally Cardio: Slightly irregular  rhythm no murmurs or rubs. GI: He has a right upper quadrant surgical scar.  He is extremely tender in the lower abdomen; right side more so than the left.  Bowel sounds are  hypoactive.  He does not have acute peritonitis. Extremities: He has good distal pulses in both feet.  He has +1 edema in both lower extremities.  Lab Results:  Recent Labs    04/24/19 1848 04/25/19 0308  WBC 17.9* 17.9*  HGB 14.1 12.8*  HCT 39.8 35.5*  PLT 458* 406*    BMET Recent Labs    04/24/19 1848 04/25/19 0308  NA 132* 135  K 3.5 3.3*  CL 101 103  CO2 20* 22  GLUCOSE 173* 142*  BUN 12 15  CREATININE 1.29* 1.27*  CALCIUM 8.0* 7.2*   PT/INR Recent Labs    04/24/19 2100  LABPROT 22.5*  INR 2.0*    Recent Labs  Lab 04/24/19 1848 04/25/19 0308  AST 26 19  ALT 25 20  ALKPHOS 91 81  BILITOT 1.1 1.7*  PROT 6.1* 5.3*  ALBUMIN 2.4* 2.0*     Lipase     Component Value Date/Time   LIPASE 42 04/24/2019 1848     Medications: . amLODipine  5 mg Oral Daily  . insulin aspart  0-9 Units Subcutaneous TID WC  . latanoprost  1 drop Both Eyes QHS  . memantine  28 mg Oral Daily  . metoprolol succinate  12.5 mg Oral Daily  . pantoprazole (PROTONIX) IV  40 mg Intravenous Daily   Prior to Admission medications   Medication Sig Start Date End Date Taking? Authorizing Provider  acetaminophen (TYLENOL) 500 MG tablet Take 500 mg by mouth daily as needed for moderate pain or headache.   Yes [provider]  amLODipine (NORVASC) 5 MG tablet Take 5 mg by mouth daily.   Yes [provider]  apixaban (ELIQUIS) 5 MG TABS tablet Take 1 tablet (5 mg total) by mouth 2 (two) times daily. 05/07/18  Yes Marin Olp, MD  latanoprost (XALATAN) 0.005 % ophthalmic solution Place 1 drop into both eyes at bedtime.  08/05/15  Yes [provider]  memantine (NAMENDA XR) 28 MG CP24 24 hr capsule TAKE 1 CAPSULE (28 MG TOTAL) BY MOUTH DAILY. 12/08/18 12/03/19 Yes Marin Olp, MD  metFORMIN (GLUCOPHAGE-XR) 500 MG 24 hr tablet TAKE 1 TABLET BY MOUTH EVERY DAY WITH BREAKFAST Patient taking differently: Take 500 mg by mouth daily with breakfast.  12/21/18  Yes  Marin Olp, MD  metoprolol succinate (TOPROL-XL) 25 MG 24 hr tablet TAKE 1/2 TABLET BY MOUTH EVERY DAY Patient taking differently: Take 12.5 mg by mouth daily.  01/18/19  Yes Marin Olp, MD   . sodium chloride 125 mL/hr at 04/25/19 0031  . ceFEPime (MAXIPIME) IV    . metronidazole 500 mg (04/25/19 0720)  . vancomycin     Antibiotics Given (last 72 hours)    Date/Time Action Medication Dose Rate   04/24/19 2123 New Bag/Given   metroNIDAZOLE (FLAGYL) IVPB 500 mg 500 mg 100 mL/hr   04/24/19 2124 New Bag/Given   ceFEPIme (MAXIPIME) 2 g in sodium chloride 0.9 % 100 mL IVPB 2 g 200 mL/hr   04/25/19 0030 New Bag/Given   vancomycin (VANCOCIN) 1,500 mg in sodium chloride 0.9 % 500 mL IVPB 1,500 mg 250 mL/hr   04/25/19 0720 New Bag/Given   metroNIDAZOLE (FLAGYL) IVPB 500 mg 500 mg 100 mL/hr      Assessment/Plan A fib,  on Eliquis - on hold Alzheimer's dementia HTN [id number] cancer of hepatic flexure, sigmoid stricture S/P robotic LOA, right colectomy, sigmoidectomy 11/26/17 CKD   Pneumoperitoneum of unclear etiology - patient had an episode similar to this in November of 2019 and resolved without surgery. - Vitals stable, WBC WNL, afebrile; discharged 04/20/19   - pt had a recent colonoscopy that showed diverticulosis and I recommended he follow up with his GI doc to have an upper endoscopy and possibly a capsule endoscopy to try to determine what area of his intestine is causing this issue.  FEN-Clear liquids VTE -SCDs, Okay to resume eliquis ID -Maxipime x1 dose 10/10; Flagyl 10/11>> day 1; vancomycin 10/11>> day 1 Follow up: GI   Plan: Patient's been admitted by Medicine, IR drain is requested.  Eliquis is on hold; he thinks his last dose was 04/23/2019.  Agree with broad-spectrum antibiotics.  This is patient's third episode since November 2019.  Patient should go forward with IR drain placement if possible.  Will follow, and hopefully see resolution with  medical management,  he may eventually come to surgery if this does not resolve.  Currently he just has the Flagyl and vancomycin ordered.  I will add Rocephin.  We will follow with you.  Recheck labs in a.m. with pre-albumin.      LOS: 1 day    Jonatan Wilsey 04/25/2019 [phone]

## 2019-04-25 NOTE — ED Notes (Signed)
Buriev MD at bedside, so is pt's daughter.

## 2019-04-25 NOTE — Progress Notes (Signed)
Patient blood pressure decreased to 78/58. Tech repeated BP in both arms with RN present.  Patient currently sleepy but easily aroused.  Paged [facility].
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
## 2019-04-26 ENCOUNTER — Ambulatory Visit: Payer: PPO | Admitting: Family Medicine

## 2019-04-26 LAB — BASIC METABOLIC PANEL
Anion gap: 9 (ref 5–15)
BUN: 18 mg/dL (ref 8–23)
CO2: 19 mmol/L — ABNORMAL LOW (ref 22–32)
Calcium: 6.7 mg/dL — ABNORMAL LOW (ref 8.9–10.3)
Chloride: 110 mmol/L (ref 98–111)
Creatinine, Ser: 1.45 mg/dL — ABNORMAL HIGH (ref 0.61–1.24)
GFR calc Af Amer: 51 mL/min — ABNORMAL LOW (ref >60)
GFR calc non Af Amer: 44 mL/min — ABNORMAL LOW (ref >60)
Glucose, Bld: 117 mg/dL — ABNORMAL HIGH (ref 70–99)
Potassium: 3.6 mmol/L (ref 3.5–5.1)
Sodium: 138 mmol/L (ref 135–145)

## 2019-04-26 LAB — URINE CULTURE: Culture: 20000 — AB

## 2019-04-26 LAB — CBC
HCT: 32.2 % — ABNORMAL LOW (ref 39.0–52.0)
Hemoglobin: 11 g/dL — ABNORMAL LOW (ref 13.0–17.0)
MCH: 32.3 pg (ref 26.0–34.0)
MCHC: 34.2 g/dL (ref 30.0–36.0)
MCV: 94.4 fL (ref 80.0–100.0)
Platelets: 356 10*3/uL (ref 150–400)
RBC: 3.41 MIL/uL — ABNORMAL LOW (ref 4.22–5.81)
RDW: 14.1 % (ref 11.5–15.5)
WBC: 13.2 10*3/uL — ABNORMAL HIGH (ref 4.0–10.5)
nRBC: 0 % (ref 0.0–0.2)

## 2019-04-26 LAB — BLOOD CULTURE ID PANEL (REFLEXED)

## 2019-04-26 LAB — GLUCOSE, CAPILLARY
Glucose-Capillary: 122 mg/dL — ABNORMAL HIGH (ref 70–99)
Glucose-Capillary: 93 mg/dL (ref 70–99)
Glucose-Capillary: 89 mg/dL (ref 70–99)
Glucose-Capillary: 72 mg/dL (ref 70–99)

## 2019-04-26 LAB — PREALBUMIN: Prealbumin: <5 mg/dL — ABNORMAL LOW (ref 18–38)

## 2019-04-26 NOTE — Progress Notes (Signed)
[name] PROGRESS NOTE  Calvin Burnett [id number] DOB: [birth year] DOA: 04/24/2019 PCP: Marin Olp, MD  Brief summary   Calvin Burnett is a 85 y.o. male with medical history significant of diabetes, GERD, hypertension and prostate cancer who was recently admitted with intra-abdominal abscess due to perforated viscus from April 13, 2009 discharged home on antibiotics, history of A. fib, currently on Eliquis.  Hypertension GERD and Alzheimer's dementia, history of right-sided colectomy and sigmoidoscopy in May 2019, pneumoperitoneum who presented again with abdominal pain as well as fever.  Patient had temperature up to 102.  He was on Eliquis and no surgery was done previous admission.  His abscess was deemed not drainable at the time.  He was found to have worsening intra-abdominal abscess.  Patient has not been on antibiotics in the last few days.   Upon arrival to ED, his temperature was 100.5 blood pressure 100/67 pulse 98 respiratory 24 oxygen sat 95% on room air.  Sodium 135 potassium 3.3 chloride 103 CO2 22 glucose 142.  Creatinine 1.27 calcium 7.2.  Albumin 2.0.  White count 17.9 platelet lites 406 and hemoglobin 12.8.  COVID-19 screen is negative.  CT abdomen pelvis shows interval development of multiple intra-abdominal abscesses measure approximately 6.2 x 5.2 x 7.1 cm.  Multiple thick-walled loops of small bowel in the mid abdomen small volume pneumoperitoneum which is decreased from previous small bilateral pleural effusions with bladder wall thickening.    He was admitted under [place].  Surgery was consulted.  He underwent intra-abdominal drain placement by IR on 04/25/2019.  He was continued on broad-spectrum IV antibiotics.  Assessment/Plan:  Intra-abdominal abscesses: recurrent infections. Ct abd: Interval development of multiple intra-abdominal abscesses as detailed above measuring up to approximately 6.2 x 5.2 by 7.1 cm. -cont iv antibiotics cefepime, vancomycin  and Flagyl. F/u culture which are negative so far.status post intra-abdominal drainage placed by IR on 04/25/2019.  Appreciate surgery help.  Further management deferred to them.    Diabetes mellitus type II:  Blood sugar control.  Monitor on sliding scale insulin.  Dementia: Stable.  He is alert and oriented.  Chronic kidney disease stage III: Also at baseline.  Continue monitoring  Paroxysmal atrial fibrillation: Rate is controlled.  In sinus rhythm.  Hold Eliquis.  Would recommend that he should be started on anticoagulation, preferably heparin in the meantime once cleared by general surgery.  Hypertension:  Blood pressure stable but on the low side.  We will discontinue his amlodipine.  Continue metoprolol with holding parameters.    Code Status: full Family Communication: Plan of care discussed with patient.  He verbalized understanding.  No questions asked. Disposition Plan: TBD.  Based on clinical improvement.   Consultants:  Surgery  IR  Procedures:  Intra-abdominal drain placement on 04/25/2019  Antibiotics: Anti-infectives (From admission, onward)   Start     Dose/Rate Route Frequency Ordered Stop   04/25/19 2200  vancomycin (VANCOCIN) 1,250 mg in sodium chloride 0.9 % 250 mL IVPB     1,250 mg 166.7 mL/hr over 90 Minutes Intravenous Every 24 hours 04/25/19 0158     04/25/19 2100  ceFEPIme (MAXIPIME) 2 g in sodium chloride 0.9 % 100 mL IVPB  Status:  Discontinued     2 g 200 mL/hr over 30 Minutes Intravenous Every 24 hours 04/24/19 2213 04/25/19 0900   04/25/19 1400  ceFEPIme (MAXIPIME) 2 g in sodium chloride 0.9 % 100 mL IVPB     2 g 200 mL/hr over 30 Minutes  Intravenous Every 12 hours 04/25/19 1341     04/25/19 1000  cefTRIAXone (ROCEPHIN) 2 g in sodium chloride 0.9 % 100 mL IVPB  Status:  Discontinued     2 g 200 mL/hr over 30 Minutes Intravenous Every 24 hours 04/25/19 0841 04/25/19 1335   04/25/19 0015  metroNIDAZOLE (FLAGYL) IVPB 500 mg     500 mg 100  mL/hr over 60 Minutes Intravenous Every 8 hours 04/25/19 0006     04/25/19 0000  vancomycin (VANCOCIN) 1,500 mg in sodium chloride 0.9 % 500 mL IVPB     1,500 mg 250 mL/hr over 120 Minutes Intravenous  Once 04/24/19 2355 04/25/19 0248   04/24/19 2215  ceFEPIme (MAXIPIME) 2 g in sodium chloride 0.9 % 100 mL IVPB  Status:  Discontinued     2 g 200 mL/hr over 30 Minutes Intravenous Every 24 hours 04/24/19 2211 04/24/19 2213   04/24/19 2100  ceFEPIme (MAXIPIME) 2 g in sodium chloride 0.9 % 100 mL IVPB     2 g 200 mL/hr over 30 Minutes Intravenous  Once 04/24/19 2046 04/24/19 2154   04/24/19 2100  metroNIDAZOLE (FLAGYL) IVPB 500 mg     500 mg 100 mL/hr over 60 Minutes Intravenous  Once 04/24/19 2046 04/24/19 2223       (indicate start date, and stop date if known)  HPI/Subjective: Patient seen and examined.  He has no complaints.  Denies any abdominal pain.  Looks comfortable.  Objective: Vitals:   04/26/19 0406 04/26/19 0827  BP: 91/61 102/66  Pulse: 65 65  Resp: 18 20  Temp: 98.4 F (36.9 C) 97.9 F (36.6 C)  SpO2: 97% 96%    Intake/Output Summary (Last 24 hours) at 04/26/2019 0955 Last data filed at 04/26/2019 0944 Gross per 24 hour  Intake 4484.64 ml  Output 1052 ml  Net 3432.64 ml   Filed Weights   04/25/19 1507 04/26/19 0406  Weight: 72.1 kg 83.1 kg    Exam:  General exam: Appears calm and comfortable  Respiratory system: Clear to auscultation with diminished breath sounds at the bases bilaterally. Respiratory effort normal. Cardiovascular system: S1 & S2 heard, irregularly regular rate and rhythm, no JVD, murmurs, rubs, gallops or clicks.  +1 pitting edema bilateral lower extremity Gastrointestinal system: Abdomen is nondistended, soft and mildly tender, no organomegaly or masses felt. Normal bowel sounds heard.  Has dressing in place. Central nervous system: Alert and oriented. No focal neurological deficits. Extremities: Symmetric 5 x 5 power. Skin: No rashes,  lesions or ulcers.  Psychiatry: Judgement and insight appear poor. Mood & affect flat.   Data Reviewed: Basic Metabolic Panel: Recent Labs  Lab 04/20/19 0304 04/24/19 1848 04/25/19 0308 04/26/19 0655  NA 138 132* 135 138  K 3.3* 3.5 3.3* 3.6  CL 107 101 103 110  CO2 21* 20* 22 19*  GLUCOSE 118* 173* 142* 117*  BUN 12 12 15 18   CREATININE 1.36* 1.29* 1.27* 1.45*  CALCIUM 7.4* 8.0* 7.2* 6.7*   Liver Function Tests: Recent Labs  Lab 04/24/19 1848 04/25/19 0308  AST 26 19  ALT 25 20  ALKPHOS 91 81  BILITOT 1.1 1.7*  PROT 6.1* 5.3*  ALBUMIN 2.4* 2.0*   Recent Labs  Lab 04/24/19 1848  LIPASE 42   No results for input(s): AMMONIA in the last 168 hours. CBC: Recent Labs  Lab 04/20/19 0304 04/24/19 1848 04/25/19 0308 04/26/19 0655  WBC 8.1 17.9* 17.9* 13.2*  NEUTROABS 4.9  --   --   --  HGB 12.3* 14.1 12.8* 11.0*  HCT 35.0* 39.8 35.5* 32.2*  MCV 93.1 92.3 93.2 94.4  PLT 224 458* 406* 356   Cardiac Enzymes: No results for input(s): CKTOTAL, CKMB, CKMBINDEX, TROPONINI in the last 168 hours. BNP (last 3 results) No results for input(s): BNP in the last 8760 hours.  ProBNP (last 3 results) Recent Labs    01/29/19 0852  PROBNP 165.0*    CBG: Recent Labs  Lab 04/25/19 1225 04/25/19 1619 04/25/19 2023 04/25/19 2126 04/26/19 0614  GLUCAP 86 90 176* 170* 122*    Recent Results (from the past 240 hour(s))  Culture, blood (routine x 2)     Status: None (Preliminary result)   Collection Time: 04/24/19  9:00 PM   Specimen: BLOOD  Result Value Ref Range Status   Specimen Description BLOOD RIGHT ANTECUBITAL  Final   Special Requests   Final    BOTTLES DRAWN AEROBIC AND ANAEROBIC Blood Culture adequate volume   Culture  Setup Time   Final    GRAM POSITIVE COCCI IN PAIRS ANAEROBIC BOTTLE ONLY CRITICAL RESULT CALLED TO, READ BACK BY AND VERIFIED WITH: Salli Real ED:[id number] 04/26/2019 Mena Goes Performed at [facility], [place] [address]  [place], [place] [address]    Culture GRAM POSITIVE COCCI  Final   Report Status PENDING  Incomplete  Blood Culture ID Panel (Reflexed)     Status: Abnormal   Collection Time: 04/24/19  9:00 PM  Result Value Ref Range Status   Enterococcus species NOT DETECTED NOT DETECTED Final   Listeria monocytogenes NOT DETECTED NOT DETECTED Final   Staphylococcus species NOT DETECTED NOT DETECTED Final   Staphylococcus aureus (BCID) NOT DETECTED NOT DETECTED Final   Streptococcus species DETECTED (A) NOT DETECTED Final    Comment: Not Enterococcus species, Streptococcus agalactiae, Streptococcus pyogenes, or Streptococcus pneumoniae. CRITICAL RESULT CALLED TO, READ BACK BY AND VERIFIED WITH: G. ABBOTT,PHARMD ED:[id number] 04/26/2019 T. TYSOR    Streptococcus agalactiae NOT DETECTED NOT DETECTED Final   Streptococcus pneumoniae NOT DETECTED NOT DETECTED Final   Streptococcus pyogenes NOT DETECTED NOT DETECTED Final   Acinetobacter baumannii NOT DETECTED NOT DETECTED Final   Enterobacteriaceae species NOT DETECTED NOT DETECTED Final   Enterobacter cloacae complex NOT DETECTED NOT DETECTED Final   Escherichia coli NOT DETECTED NOT DETECTED Final   Klebsiella oxytoca NOT DETECTED NOT DETECTED Final   Klebsiella pneumoniae NOT DETECTED NOT DETECTED Final   Proteus species NOT DETECTED NOT DETECTED Final   Serratia marcescens NOT DETECTED NOT DETECTED Final   Haemophilus influenzae NOT DETECTED NOT DETECTED Final   Neisseria meningitidis NOT DETECTED NOT DETECTED Final   Pseudomonas aeruginosa NOT DETECTED NOT DETECTED Final   Candida albicans NOT DETECTED NOT DETECTED Final   Candida glabrata NOT DETECTED NOT DETECTED Final   Candida krusei NOT DETECTED NOT DETECTED Final   Candida parapsilosis NOT DETECTED NOT DETECTED Final   Candida tropicalis NOT DETECTED NOT DETECTED Final    Comment: Performed at [facility], [place] [address], [place], [place] [address]  Culture, blood (routine x 2)     Status: None  (Preliminary result)   Collection Time: 04/24/19  9:10 PM   Specimen: BLOOD LEFT FOREARM  Result Value Ref Range Status   Specimen Description BLOOD LEFT FOREARM  Final   Special Requests   Final    BOTTLES DRAWN AEROBIC AND ANAEROBIC Blood Culture adequate volume   Culture   Final    NO GROWTH 2 DAYS Performed at  [facility], [place] [address], [place], [place] [address]    Report Status PENDING  Incomplete  SARS CORONAVIRUS 2 (TAT 6-24 HRS) Nasopharyngeal Nasopharyngeal Swab     Status: None   Collection Time: 04/25/19 12:11 AM   Specimen: Nasopharyngeal Swab  Result Value Ref Range Status   SARS Coronavirus 2 NEGATIVE NEGATIVE Final    Comment: (NOTE) SARS-CoV-2 target nucleic acids are NOT DETECTED. The SARS-CoV-2 RNA is generally detectable in upper and lower respiratory specimens during the acute phase of infection. Negative results do not preclude SARS-CoV-2 infection, do not rule out co-infections with other pathogens, and should not be used as the sole basis for treatment or other patient management decisions. Negative results must be combined with clinical observations, patient history, and epidemiological information. The expected result is Negative. Fact Sheet for Patients: SugarRoll.be Fact Sheet for Healthcare Providers: https://www.woods-mathews.com/ This test is not yet approved or cleared by the [place] FDA and  has been authorized for detection and/or diagnosis of SARS-CoV-2 by FDA under an Emergency Use Authorization (EUA). This EUA will remain  in effect (meaning this test can be used) for the duration of the COVID-19 declaration under Section 56 4(b)(1) of the Act, 21 U.S.C. section 360bbb-3(b)(1), unless the authorization is terminated or revoked sooner. Performed at [facility], [place] [address] [place], [place] [address]   Aerobic/Anaerobic Culture (surgical/deep wound)     Status: None  (Preliminary result)   Collection Time: 04/25/19  2:40 PM   Specimen: Abscess  Result Value Ref Range Status   Specimen Description ABSCESS  Final   Special Requests Normal  Final   Gram Stain   Final    MODERATE WBC PRESENT, PREDOMINANTLY PMN ABUNDANT GRAM NEGATIVE RODS MODERATE GRAM POSITIVE COCCI Performed at [facility], [address] [place],  [address]    Culture PENDING  Incomplete   Report Status PENDING  Incomplete     Studies: Ct Abdomen Pelvis W Contrast  Result Date: 04/24/2019 CLINICAL DATA:  Abdominal pain with diverticulitis suspected. EXAM: CT ABDOMEN AND PELVIS WITH CONTRAST TECHNIQUE: Multidetector CT imaging of the abdomen and pelvis was performed using the standard protocol following bolus administration of intravenous contrast. CONTRAST:  170mL OMNIPAQUE IOHEXOL 300 MG/ML  SOLN COMPARISON:  04/14/2019 CT. FINDINGS: Lower chest: There are small bilateral pleural effusions, right greater than left. There is atelectasis at the lung bases. The heart size is mildly enlarged. Hepatobiliary: The liver is normal. Status post cholecystectomy.There is no biliary ductal dilation. Pancreas: Normal contours without ductal dilatation. No peripancreatic fluid collection. Spleen: No splenic laceration or hematoma. Adrenals/Urinary Tract: --Adrenal glands: No adrenal hemorrhage. --Right kidney/ureter: No hydronephrosis or perinephric hematoma. --Left kidney/ureter: No hydronephrosis or perinephric hematoma. --Urinary bladder: There is mild bladder wall thickening. This is likely reactive. Stomach/Bowel: --Stomach/Duodenum: No hiatal hernia or other gastric abnormality. Normal duodenal course and caliber. --Small bowel: There are few mildly prominent loops of small bowel scattered throughout the abdomen without evidence for high-grade small bowel obstruction. There are multiple inflamed loops of small bowel in the mid abdomen (axial series 3, image 50). Adjacent to these small  bowel loops there is an abscess measuring approximately 5.1 x 6.2 by 7.1 cm. This abscess appears to extend to the right across the patient's midline and possibly connect to several additional smaller abscesses measuring up to approximately 1.7 cm and 3 x 5.6 cm there is a smaller developing abscess in the right lower quadrant measuring approximately 4.3  x 2 cm. --Colon: Again noted is a surgical anastomosis at the rectosigmoid junction. There are few scattered colonic diverticula. The patient is status post right hemicolectomy. --Appendix: Surgically absent. Vascular/Lymphatic: Atherosclerotic calcification is present within the non-aneurysmal abdominal aorta, without hemodynamically significant stenosis. There is a circumaortic left renal vein, a normal variant. --No retroperitoneal lymphadenopathy. --No mesenteric lymphadenopathy. --there are enlarged reactive mesenteric lymph nodes. Extensive inflammatory changes are noted in the patient's mesentery. Reproductive: Brachytherapy beads are noted. Other: There is a significant interval reduction in volume of pneumoperitoneum. There are a few residual pockets of free air in the peritoneal cavity. There is a small fat containing periumbilical hernia. Musculoskeletal. No acute displaced fractures. IMPRESSION: 1. Interval development of multiple intra-abdominal abscesses as detailed above measuring up to approximately 6.2 x 5.2 by 7.1 cm. The largest abscess is amenable to percutaneous drainage as clinically indicated. 2. Multiple thick walled matted loops of small bowel in the mid abdomen, likely the source of the patient's bowel perforation. These areas are suboptimally evaluated in the absence of oral contrast. 3. Small volume pneumoperitoneum, decreased from prior study. 4. Small bilateral pleural effusions with adjacent atelectasis. 5. Mild bladder wall thickening, likely reactive. Electronically Signed   By: Constance Holster M.D.   On: 04/24/2019 23:31   Dg  Chest Port 1 View  Result Date: 04/24/2019 CLINICAL DATA:  Fever. EXAM: PORTABLE CHEST 1 VIEW COMPARISON:  04/14/2019 FINDINGS: The heart size is enlarged. This is stable from prior study. There is no pneumothorax. No large pleural effusion. No free air under the hemidiaphragms. No focal infiltrate. No acute osseous abnormality. IMPRESSION: No active disease. Electronically Signed   By: Constance Holster M.D.   On: 04/24/2019 21:14   Ct Image Guided Drainage Percut Cath  Peritoneal Retroperit  Result Date: 04/25/2019 CLINICAL DATA:  Previous right partial colectomy May 2019, pneumoperitoneum November 2019 and again September thirtieth common now with worsening abdominal pain, CT demonstrating multiple loculated fluid collections suggesting abscesses, large 7.1 cm left lower quadrant. EXAM: CT GUIDED DRAINAGE OF PERITONEAL ABSCESS ANESTHESIA/SEDATION: Intravenous Fentanyl 51mcg and Versed 1.5mg  were administered as conscious sedation during continuous monitoring of the patient's level of consciousness and physiological / cardiorespiratory status by the radiology RN, with a total moderate sedation time of 11 minutes. PROCEDURE: The procedure, risks, benefits, and alternatives were explained to the patient. Questions regarding the procedure were encouraged and answered. The patient understands and consents to the procedure. Select axial scans through the lower abdomen were obtained. The dominant left lower quadrant collection was localized and an appropriate skin entry site was determined and marked. The operative field was prepped with chlorhexidinein a sterile fashion, and a sterile drape was applied covering the operative field. A sterile gown and sterile gloves were used for the procedure. Local anesthesia was provided with 1% Lidocaine. Under CT fluoroscopic guidance, 18 gauge trocar needle advanced into the collection. Purulent material could be aspirated. Amplatz wire advanced easily within the  collection, its position confirmed on CT. Tract dilated to facilitate placement of a 10 French pigtail drain catheter, placed within the dependent aspect of the collection. 30 mL purulent material were aspirated, sent for Gram stain and culture. Catheter position confirmed on CT. Catheter secured externally with 0 Prolene suture and StatLock and placed to gravity drain bag. The patient tolerated the procedure well. COMPLICATIONS: None immediate FINDINGS: Left lower quadrant complex loculated collection was localized. 10 French pigtail drain catheter placed as above. Sample of the aspirate sent for Gram stain  and culture. IMPRESSION: 1. Technically successful CT-guided left lower quadrant peritoneal abscess drain catheter placement. Electronically Signed   By: Lucrezia Europe M.D.   On: 04/25/2019 15:04    Scheduled Meds: . amLODipine  5 mg Oral Daily  . insulin aspart  0-9 Units Subcutaneous TID WC  . latanoprost  1 drop Both Eyes QHS  . memantine  28 mg Oral Daily  . metoprolol succinate  12.5 mg Oral Daily  . pantoprazole (PROTONIX) IV  40 mg Intravenous Daily  . sodium chloride flush  5 mL Intracatheter Q8H   Continuous Infusions: . sodium chloride 125 mL/hr at 04/26/19 0944  . ceFEPime (MAXIPIME) IV 2 g (04/26/19 0201)  . metronidazole 500 mg (04/26/19 0609)  . vancomycin Stopped (04/26/19 0156)    Principal Problem:   Intra-abdominal abscess ([name]) Active Problems:   Essential hypertension   GERD (gastroesophageal reflux disease)   Type 2 diabetes mellitus ([name])   Prostate cancer ([name])   Hyperlipidemia associated with type 2 diabetes mellitus ([place])   CKD (chronic kidney disease), stage III ([name])   AF (paroxysmal atrial fibrillation) ([place])   Perforation bowel ([place])   Dementia ([place])   Pneumoperitoneum  Time spent: 33 minutes  [place]  [name] If 7PM-7AM, please contact night-coverage at [email or web address], password [facility] 04/26/2019, 9:55 AM  LOS: 2 days

## 2019-04-26 NOTE — Progress Notes (Signed)
PHARMACY - PHYSICIAN COMMUNICATION CRITICAL VALUE ALERT - BLOOD CULTURE IDENTIFICATION (BCID)  Calvin Burnett is an 85 y.o. male who presented to [facility] on 04/24/2019 with worsening abdominal abcesses  Assessment: 1/2 blood cultures growing Streptococcus species.    Name of physician (or Provider) Contacted:  Dr. Doristine Bosworth  Current antibiotics: Vancomycin and Cefepime and Flagyl  Changes to prescribed antibiotics recommended:   None at this time. F/U abcess cultures done 10/11 and reevaluate once finalized  Results for orders placed or performed during the hospital encounter of 04/24/19  Blood Culture ID Panel (Reflexed) (Collected: 04/24/2019  9:00 PM)  Result Value Ref Range   Enterococcus species NOT DETECTED NOT DETECTED   Listeria monocytogenes NOT DETECTED NOT DETECTED   Staphylococcus species NOT DETECTED NOT DETECTED   Staphylococcus aureus (BCID) NOT DETECTED NOT DETECTED   Streptococcus species DETECTED (A) NOT DETECTED   Streptococcus agalactiae NOT DETECTED NOT DETECTED   Streptococcus pneumoniae NOT DETECTED NOT DETECTED   Streptococcus pyogenes NOT DETECTED NOT DETECTED   Acinetobacter baumannii NOT DETECTED NOT DETECTED   Enterobacteriaceae species NOT DETECTED NOT DETECTED   Enterobacter cloacae complex NOT DETECTED NOT DETECTED   Escherichia coli NOT DETECTED NOT DETECTED   Klebsiella oxytoca NOT DETECTED NOT DETECTED   Klebsiella pneumoniae NOT DETECTED NOT DETECTED   Proteus species NOT DETECTED NOT DETECTED   Serratia marcescens NOT DETECTED NOT DETECTED   Haemophilus influenzae NOT DETECTED NOT DETECTED   Neisseria meningitidis NOT DETECTED NOT DETECTED   Pseudomonas aeruginosa NOT DETECTED NOT DETECTED   Candida albicans NOT DETECTED NOT DETECTED   Candida glabrata NOT DETECTED NOT DETECTED   Candida krusei NOT DETECTED NOT DETECTED   Candida parapsilosis NOT DETECTED NOT DETECTED   Candida tropicalis NOT DETECTED NOT DETECTED    Caryl Pina 04/26/2019  6:57 AM

## 2019-04-26 NOTE — Progress Notes (Signed)
Patient ID: Calvin Burnett, male   DOB: 02/16/1934, 85 y.o.   MRN: [id number]       Subjective: Patient states he has no pain just laying there, but has some with palpation.    Objective: Vital signs in last 24 hours: Temp:  [97.9 F (36.6 C)-100.2 F (37.9 C)] 97.9 F (36.6 C) (10/12 0827) Pulse Rate:  [65-99] 65 (10/12 0827) Resp:  [18-27] 20 (10/12 0827) BP: (78-130)/(58-85) 102/66 (10/12 0827) SpO2:  [93 %-100 %] 96 % (10/12 0827) Weight:  [72.1 kg-83.1 kg] 83.1 kg (10/12 0406) Last BM Date: 04/25/19  Intake/Output from previous day: 10/11 0701 - 10/12 0700 In: 3597.6 [P.O.:120; I.V.:2692.6; IV Piggyback:750] Out: 852 [Urine:551; Drains:300; Stool:1] Intake/Output this shift: Total I/O In: 887 [P.O.:60; I.V.:827] Out: 200 [Urine:200]  PE: Heart: regular Lungs: CTAB Abd: soft, but with some distention, hypoactive BS, drain in place with enteric/bilious output.  Some tenderness throughout, but no guarding or rebounding  Lab Results:  Recent Labs    04/25/19 0308 04/26/19 0655  WBC 17.9* 13.2*  HGB 12.8* 11.0*  HCT 35.5* 32.2*  PLT 406* 356   BMET Recent Labs    04/25/19 0308 04/26/19 0655  NA 135 138  K 3.3* 3.6  CL 103 110  CO2 22 19*  GLUCOSE 142* 117*  BUN 15 18  CREATININE 1.27* 1.45*  CALCIUM 7.2* 6.7*   PT/INR Recent Labs    04/24/19 2100  LABPROT 22.5*  INR 2.0*   CMP     Component Value Date/Time   NA 138 04/26/2019 0655   K 3.6 04/26/2019 0655   CL 110 04/26/2019 0655   CO2 19 (L) 04/26/2019 0655   GLUCOSE 117 (H) 04/26/2019 0655   BUN 18 04/26/2019 0655   CREATININE 1.45 (H) 04/26/2019 0655   CALCIUM 6.7 (L) 04/26/2019 0655   PROT 5.3 (L) 04/25/2019 0308   ALBUMIN 2.0 (L) 04/25/2019 0308   AST 19 04/25/2019 0308   ALT 20 04/25/2019 0308   ALKPHOS 81 04/25/2019 0308   BILITOT 1.7 (H) 04/25/2019 0308   GFRNONAA 44 (L) 04/26/2019 0655   GFRAA 51 (L) 04/26/2019 0655   Lipase     Component Value Date/Time   LIPASE 42  04/24/2019 1848       Studies/Results: Ct Abdomen Pelvis W Contrast  Result Date: 04/24/2019 CLINICAL DATA:  Abdominal pain with diverticulitis suspected. EXAM: CT ABDOMEN AND PELVIS WITH CONTRAST TECHNIQUE: Multidetector CT imaging of the abdomen and pelvis was performed using the standard protocol following bolus administration of intravenous contrast. CONTRAST:  174mL OMNIPAQUE IOHEXOL 300 MG/ML  SOLN COMPARISON:  04/14/2019 CT. FINDINGS: Lower chest: There are small bilateral pleural effusions, right greater than left. There is atelectasis at the lung bases. The heart size is mildly enlarged. Hepatobiliary: The liver is normal. Status post cholecystectomy.There is no biliary ductal dilation. Pancreas: Normal contours without ductal dilatation. No peripancreatic fluid collection. Spleen: No splenic laceration or hematoma. Adrenals/Urinary Tract: --Adrenal glands: No adrenal hemorrhage. --Right kidney/ureter: No hydronephrosis or perinephric hematoma. --Left kidney/ureter: No hydronephrosis or perinephric hematoma. --Urinary bladder: There is mild bladder wall thickening. This is likely reactive. Stomach/Bowel: --Stomach/Duodenum: No hiatal hernia or other gastric abnormality. Normal duodenal course and caliber. --Small bowel: There are few mildly prominent loops of small bowel scattered throughout the abdomen without evidence for high-grade small bowel obstruction. There are multiple inflamed loops of small bowel in the mid abdomen (axial series 3, image 50). Adjacent to these small bowel loops there is an  abscess measuring approximately 5.1 x 6.2 by 7.1 cm. This abscess appears to extend to the right across the patient's midline and possibly connect to several additional smaller abscesses measuring up to approximately 1.7 cm and 3 x 5.6 cm there is a smaller developing abscess in the right lower quadrant measuring approximately 4.3 x 2 cm. --Colon: Again noted is a surgical anastomosis at the  rectosigmoid junction. There are few scattered colonic diverticula. The patient is status post right hemicolectomy. --Appendix: Surgically absent. Vascular/Lymphatic: Atherosclerotic calcification is present within the non-aneurysmal abdominal aorta, without hemodynamically significant stenosis. There is a circumaortic left renal vein, a normal variant. --No retroperitoneal lymphadenopathy. --No mesenteric lymphadenopathy. --there are enlarged reactive mesenteric lymph nodes. Extensive inflammatory changes are noted in the patient's mesentery. Reproductive: Brachytherapy beads are noted. Other: There is a significant interval reduction in volume of pneumoperitoneum. There are a few residual pockets of free air in the peritoneal cavity. There is a small fat containing periumbilical hernia. Musculoskeletal. No acute displaced fractures. IMPRESSION: 1. Interval development of multiple intra-abdominal abscesses as detailed above measuring up to approximately 6.2 x 5.2 by 7.1 cm. The largest abscess is amenable to percutaneous drainage as clinically indicated. 2. Multiple thick walled matted loops of small bowel in the mid abdomen, likely the source of the patient's bowel perforation. These areas are suboptimally evaluated in the absence of oral contrast. 3. Small volume pneumoperitoneum, decreased from prior study. 4. Small bilateral pleural effusions with adjacent atelectasis. 5. Mild bladder wall thickening, likely reactive. Electronically Signed   By: Constance Holster M.D.   On: 04/24/2019 23:31   Dg Chest Port 1 View  Result Date: 04/24/2019 CLINICAL DATA:  Fever. EXAM: PORTABLE CHEST 1 VIEW COMPARISON:  04/14/2019 FINDINGS: The heart size is enlarged. This is stable from prior study. There is no pneumothorax. No large pleural effusion. No free air under the hemidiaphragms. No focal infiltrate. No acute osseous abnormality. IMPRESSION: No active disease. Electronically Signed   By: Constance Holster M.D.    On: 04/24/2019 21:14   Ct Image Guided Drainage Percut Cath  Peritoneal Retroperit  Result Date: 04/25/2019 CLINICAL DATA:  Previous right partial colectomy May 2019, pneumoperitoneum November 2019 and again September thirtieth common now with worsening abdominal pain, CT demonstrating multiple loculated fluid collections suggesting abscesses, large 7.1 cm left lower quadrant. EXAM: CT GUIDED DRAINAGE OF PERITONEAL ABSCESS ANESTHESIA/SEDATION: Intravenous Fentanyl 59mcg and Versed 1.5mg  were administered as conscious sedation during continuous monitoring of the patient's level of consciousness and physiological / cardiorespiratory status by the radiology RN, with a total moderate sedation time of 11 minutes. PROCEDURE: The procedure, risks, benefits, and alternatives were explained to the patient. Questions regarding the procedure were encouraged and answered. The patient understands and consents to the procedure. Select axial scans through the lower abdomen were obtained. The dominant left lower quadrant collection was localized and an appropriate skin entry site was determined and marked. The operative field was prepped with chlorhexidinein a sterile fashion, and a sterile drape was applied covering the operative field. A sterile gown and sterile gloves were used for the procedure. Local anesthesia was provided with 1% Lidocaine. Under CT fluoroscopic guidance, 18 gauge trocar needle advanced into the collection. Purulent material could be aspirated. Amplatz wire advanced easily within the collection, its position confirmed on CT. Tract dilated to facilitate placement of a 10 French pigtail drain catheter, placed within the dependent aspect of the collection. 30 mL purulent material were aspirated, sent for Gram stain and culture.  Catheter position confirmed on CT. Catheter secured externally with 0 Prolene suture and StatLock and placed to gravity drain bag. The patient tolerated the procedure well.  COMPLICATIONS: None immediate FINDINGS: Left lower quadrant complex loculated collection was localized. 10 French pigtail drain catheter placed as above. Sample of the aspirate sent for Gram stain and culture. IMPRESSION: 1. Technically successful CT-guided left lower quadrant peritoneal abscess drain catheter placement. Electronically Signed   By: Lucrezia Europe M.D.   On: 04/25/2019 15:04    Anti-infectives: Anti-infectives (From admission, onward)   Start     Dose/Rate Route Frequency Ordered Stop   04/25/19 2200  vancomycin (VANCOCIN) 1,250 mg in sodium chloride 0.9 % 250 mL IVPB     1,250 mg 166.7 mL/hr over 90 Minutes Intravenous Every 24 hours 04/25/19 0158     04/25/19 2100  ceFEPIme (MAXIPIME) 2 g in sodium chloride 0.9 % 100 mL IVPB  Status:  Discontinued     2 g 200 mL/hr over 30 Minutes Intravenous Every 24 hours 04/24/19 2213 04/25/19 0900   04/25/19 1400  ceFEPIme (MAXIPIME) 2 g in sodium chloride 0.9 % 100 mL IVPB     2 g 200 mL/hr over 30 Minutes Intravenous Every 12 hours 04/25/19 1341     04/25/19 1000  cefTRIAXone (ROCEPHIN) 2 g in sodium chloride 0.9 % 100 mL IVPB  Status:  Discontinued     2 g 200 mL/hr over 30 Minutes Intravenous Every 24 hours 04/25/19 0841 04/25/19 1335   04/25/19 0015  metroNIDAZOLE (FLAGYL) IVPB 500 mg     500 mg 100 mL/hr over 60 Minutes Intravenous Every 8 hours 04/25/19 0006     04/25/19 0000  vancomycin (VANCOCIN) 1,500 mg in sodium chloride 0.9 % 500 mL IVPB     1,500 mg 250 mL/hr over 120 Minutes Intravenous  Once 04/24/19 2355 04/25/19 0248   04/24/19 2215  ceFEPIme (MAXIPIME) 2 g in sodium chloride 0.9 % 100 mL IVPB  Status:  Discontinued     2 g 200 mL/hr over 30 Minutes Intravenous Every 24 hours 04/24/19 2211 04/24/19 2213   04/24/19 2100  ceFEPIme (MAXIPIME) 2 g in sodium chloride 0.9 % 100 mL IVPB     2 g 200 mL/hr over 30 Minutes Intravenous  Once 04/24/19 2046 04/24/19 2154   04/24/19 2100  metroNIDAZOLE (FLAGYL) IVPB 500 mg     500  mg 100 mL/hr over 60 Minutes Intravenous  Once 04/24/19 2046 04/24/19 2223       Assessment/Plan A fib, on Eliquis - on hold Alzheimer's dementia HTN [id number] cancer of hepatic flexure, sigmoid stricture S/P robotic LOA, right colectomy, sigmoidectomy 11/26/17 CKD   Pneumoperitoneum of unclear etiology -s/p perc drain by IR, initial contents tan/brown in nature, more enteric/bilious now c/w hole likely somewhere in SB -remain NPO, trying to decide on OR vs watchful waiting with IR drain. -cont abx therapy  FEN - NPO VTE - Eliquis on hold, last dose 10/8 per chart,  ID - Vanc/Maxipime/Flagyl.  Likely don't need all of these    LOS: 2 days    [name] , [facility] 04/26/2019, 10:25 AM Pager: [phone]

## 2019-04-27 ENCOUNTER — Inpatient Hospital Stay: Payer: Self-pay

## 2019-04-27 ENCOUNTER — Encounter (HOSPITAL_COMMUNITY): Payer: Self-pay | Admitting: Certified Registered"

## 2019-04-27 ENCOUNTER — Encounter (HOSPITAL_COMMUNITY): Admission: EM | Disposition: A | Discharge status: Home Health | Payer: Self-pay | Source: Home / Self Care

## 2019-04-27 ENCOUNTER — Inpatient Hospital Stay (HOSPITAL_COMMUNITY): Payer: PPO | Admitting: Certified Registered"

## 2019-04-27 HISTORY — PX: COLON RESECTION: SHX5231

## 2019-04-27 HISTORY — PX: LAPAROTOMY: SHX154

## 2019-04-27 LAB — CBC WITH DIFFERENTIAL/PLATELET
Abs Immature Granulocytes: 0.14 10*3/uL — ABNORMAL HIGH (ref 0.00–0.07)
Basophils Absolute: 0.1 10*3/uL (ref 0.0–0.1)
Basophils Relative: 1 %
Eosinophils Absolute: 0.3 10*3/uL (ref 0.0–0.5)
Eosinophils Relative: 4 %
HCT: 34.3 % — ABNORMAL LOW (ref 39.0–52.0)
Hemoglobin: 11.8 g/dL — ABNORMAL LOW (ref 13.0–17.0)
Immature Granulocytes: 2 %
Lymphocytes Relative: 15 %
Lymphs Abs: 1.3 10*3/uL (ref 0.7–4.0)
MCH: 32.2 pg (ref 26.0–34.0)
MCHC: 34.4 g/dL (ref 30.0–36.0)
MCV: 93.5 fL (ref 80.0–100.0)
Monocytes Absolute: 0.7 10*3/uL (ref 0.1–1.0)
Monocytes Relative: 8 %
Neutro Abs: 5.7 10*3/uL (ref 1.7–7.7)
Neutrophils Relative %: 70 %
Platelets: 367 10*3/uL (ref 150–400)
RBC: 3.67 MIL/uL — ABNORMAL LOW (ref 4.22–5.81)
RDW: 14.4 % (ref 11.5–15.5)
WBC: 8.1 10*3/uL (ref 4.0–10.5)
nRBC: 0 % (ref 0.0–0.2)

## 2019-04-27 LAB — AEROBIC/ANAEROBIC CULTURE W GRAM STAIN (SURGICAL/DEEP WOUND): Special Requests: NORMAL

## 2019-04-27 LAB — BASIC METABOLIC PANEL
Anion gap: 12 (ref 5–15)
BUN: 16 mg/dL (ref 8–23)
CO2: 17 mmol/L — ABNORMAL LOW (ref 22–32)
Calcium: 7.1 mg/dL — ABNORMAL LOW (ref 8.9–10.3)
Chloride: 110 mmol/L (ref 98–111)
Creatinine, Ser: 1.12 mg/dL (ref 0.61–1.24)
GFR calc Af Amer: >60 mL/min (ref >60)
GFR calc non Af Amer: >60 mL/min (ref >60)
Glucose, Bld: 82 mg/dL (ref 70–99)
Potassium: 3.5 mmol/L (ref 3.5–5.1)
Sodium: 139 mmol/L (ref 135–145)

## 2019-04-27 LAB — GLUCOSE, CAPILLARY
Glucose-Capillary: 82 mg/dL (ref 70–99)
Glucose-Capillary: 80 mg/dL (ref 70–99)
Glucose-Capillary: 164 mg/dL — ABNORMAL HIGH (ref 70–99)
Glucose-Capillary: 164 mg/dL — ABNORMAL HIGH (ref 70–99)
Glucose-Capillary: 159 mg/dL — ABNORMAL HIGH (ref 70–99)

## 2019-04-27 LAB — SURGICAL PCR SCREEN
MRSA, PCR: NEGATIVE
Staphylococcus aureus: NEGATIVE

## 2019-04-27 SURGERY — LAPAROTOMY, EXPLORATORY
Anesthesia: General | Site: Abdomen

## 2019-04-27 MED ORDER — MORPHINE SULFATE (PF) 4 MG/ML IV SOLN
INTRAVENOUS | Status: AC
  Filled 2019-04-27: qty 1

## 2019-04-27 MED ORDER — ACETAMINOPHEN 10 MG/ML IV SOLN
1000.0000 mg | Freq: Four times a day (QID) | INTRAVENOUS | Status: AC
  Administered 2019-04-27 – 2019-04-28 (×4): 1000 mg via INTRAVENOUS
  Filled 2019-04-27 (×4): qty 100

## 2019-04-27 MED ORDER — PHENYLEPHRINE 40 MCG/ML (10ML) SYRINGE FOR IV PUSH (FOR BLOOD PRESSURE SUPPORT)
PREFILLED_SYRINGE | INTRAVENOUS | Status: DC | PRN
  Administered 2019-04-27: 40 ug via INTRAVENOUS
  Administered 2019-04-27: 80 ug via INTRAVENOUS
  Administered 2019-04-27: 40 ug via INTRAVENOUS
  Administered 2019-04-27 (×3): 80 ug via INTRAVENOUS

## 2019-04-27 MED ORDER — PIPERACILLIN-TAZOBACTAM 3.375 G IVPB
3.3750 g | Freq: Three times a day (TID) | INTRAVENOUS | Status: DC
  Administered 2019-04-27 – 2019-04-29 (×7): 3.375 g via INTRAVENOUS
  Filled 2019-04-27 (×5): qty 50

## 2019-04-27 MED ORDER — ONDANSETRON HCL 4 MG/2ML IJ SOLN
INTRAMUSCULAR | Status: DC | PRN
  Administered 2019-04-27: 4 mg via INTRAVENOUS

## 2019-04-27 MED ORDER — MORPHINE SULFATE (PF) 2 MG/ML IV SOLN
1.0000 mg | INTRAVENOUS | Status: DC | PRN
  Administered 2019-04-27: 1 mg via INTRAVENOUS
  Administered 2019-04-28: 2 mg via INTRAVENOUS
  Filled 2019-04-27 (×2): qty 1

## 2019-04-27 MED ORDER — SUCCINYLCHOLINE CHLORIDE 200 MG/10ML IV SOSY
PREFILLED_SYRINGE | INTRAVENOUS | Status: DC | PRN
  Administered 2019-04-27: 100 mg via INTRAVENOUS

## 2019-04-27 MED ORDER — ROCURONIUM BROMIDE 10 MG/ML (PF) SYRINGE
PREFILLED_SYRINGE | INTRAVENOUS | Status: DC | PRN
  Administered 2019-04-27 (×2): 20 mg via INTRAVENOUS
  Administered 2019-04-27: 60 mg via INTRAVENOUS

## 2019-04-27 MED ORDER — FENTANYL CITRATE (PF) 100 MCG/2ML IJ SOLN
INTRAMUSCULAR | Status: AC
  Filled 2019-04-27: qty 2

## 2019-04-27 MED ORDER — SUGAMMADEX SODIUM 500 MG/5ML IV SOLN
INTRAVENOUS | Status: AC
  Filled 2019-04-27: qty 5

## 2019-04-27 MED ORDER — SODIUM CHLORIDE 0.9 % IV SOLN
INTRAVENOUS | Status: DC | PRN
  Administered 2019-04-27: 50 ug/min via INTRAVENOUS

## 2019-04-27 MED ORDER — ONDANSETRON HCL 4 MG/2ML IJ SOLN: 4.0000 mg | Freq: Once | INTRAMUSCULAR | Status: DC | PRN

## 2019-04-27 MED ORDER — ACETAMINOPHEN 10 MG/ML IV SOLN
INTRAVENOUS | Status: AC
  Filled 2019-04-27: qty 100

## 2019-04-27 MED ORDER — ONDANSETRON HCL 4 MG/2ML IJ SOLN
INTRAMUSCULAR | Status: AC
  Filled 2019-04-27: qty 2

## 2019-04-27 MED ORDER — LIDOCAINE 2% (20 MG/ML) 5 ML SYRINGE
INTRAMUSCULAR | Status: AC
  Filled 2019-04-27: qty 5

## 2019-04-27 MED ORDER — ALBUMIN HUMAN 5 % IV SOLN
INTRAVENOUS | Status: DC | PRN
  Administered 2019-04-27: 12:00:00 via INTRAVENOUS

## 2019-04-27 MED ORDER — LACTATED RINGERS IV SOLN
INTRAVENOUS | Status: DC
  Administered 2019-04-27 (×2): via INTRAVENOUS

## 2019-04-27 MED ORDER — DEXAMETHASONE SODIUM PHOSPHATE 10 MG/ML IJ SOLN
INTRAMUSCULAR | Status: AC
  Filled 2019-04-27: qty 1

## 2019-04-27 MED ORDER — FENTANYL CITRATE (PF) 100 MCG/2ML IJ SOLN
25.0000 ug | INTRAMUSCULAR | Status: DC | PRN
  Administered 2019-04-27: 75 ug via INTRAVENOUS
  Administered 2019-04-27: 50 ug via INTRAVENOUS
  Administered 2019-04-27: 25 ug via INTRAVENOUS

## 2019-04-27 MED ORDER — LIDOCAINE 2% (20 MG/ML) 5 ML SYRINGE
INTRAMUSCULAR | Status: DC | PRN
  Administered 2019-04-27: 100 mg via INTRAVENOUS

## 2019-04-27 MED ORDER — DEXAMETHASONE SODIUM PHOSPHATE 10 MG/ML IJ SOLN
INTRAMUSCULAR | Status: DC | PRN
  Administered 2019-04-27: 5 mg via INTRAVENOUS

## 2019-04-27 MED ORDER — SUGAMMADEX SODIUM 200 MG/2ML IV SOLN
INTRAVENOUS | Status: DC | PRN
  Administered 2019-04-27: 200 mg via INTRAVENOUS

## 2019-04-27 MED ORDER — FENTANYL CITRATE (PF) 100 MCG/2ML IJ SOLN: 25.0000 ug | INTRAMUSCULAR | Status: DC | PRN

## 2019-04-27 MED ORDER — PHENYLEPHRINE 40 MCG/ML (10ML) SYRINGE FOR IV PUSH (FOR BLOOD PRESSURE SUPPORT)
PREFILLED_SYRINGE | INTRAVENOUS | Status: AC
  Filled 2019-04-27: qty 10

## 2019-04-27 MED ORDER — METHOCARBAMOL 1000 MG/10ML IJ SOLN
500.0000 mg | Freq: Three times a day (TID) | INTRAVENOUS | Status: DC | PRN
  Filled 2019-04-27: qty 5

## 2019-04-27 MED ORDER — FENTANYL CITRATE (PF) 250 MCG/5ML IJ SOLN
INTRAMUSCULAR | Status: DC | PRN
  Administered 2019-04-27 (×2): 50 ug via INTRAVENOUS

## 2019-04-27 MED ORDER — ROCURONIUM BROMIDE 10 MG/ML (PF) SYRINGE
PREFILLED_SYRINGE | INTRAVENOUS | Status: AC
  Filled 2019-04-27: qty 10

## 2019-04-27 MED ORDER — PROPOFOL 10 MG/ML IV BOLUS
INTRAVENOUS | Status: AC
  Filled 2019-04-27: qty 20

## 2019-04-27 MED ORDER — SUCCINYLCHOLINE CHLORIDE 200 MG/10ML IV SOSY
PREFILLED_SYRINGE | INTRAVENOUS | Status: AC
  Filled 2019-04-27: qty 10

## 2019-04-27 MED ORDER — ACETAMINOPHEN 10 MG/ML IV SOLN: 1000.0000 mg | Freq: Once | INTRAVENOUS | Status: DC | PRN

## 2019-04-27 MED ORDER — ENOXAPARIN SODIUM 40 MG/0.4ML ~~LOC~~ SOLN
40.0000 mg | SUBCUTANEOUS | Status: DC
  Administered 2019-04-28 – 2019-05-01 (×4): 40 mg via SUBCUTANEOUS
  Filled 2019-04-27 (×4): qty 0.4

## 2019-04-27 MED ORDER — SODIUM CHLORIDE 0.9 % IV SOLN
INTRAVENOUS | Status: AC
  Administered 2019-04-27 – 2019-04-28 (×2): via INTRAVENOUS

## 2019-04-27 MED ORDER — PROPOFOL 10 MG/ML IV BOLUS
INTRAVENOUS | Status: DC | PRN
  Administered 2019-04-27: 180 mg via INTRAVENOUS

## 2019-04-27 MED ORDER — 0.9 % SODIUM CHLORIDE (POUR BTL) OPTIME
TOPICAL | Status: DC | PRN
  Administered 2019-04-27 (×4): 1000 mL

## 2019-04-27 MED ORDER — FENTANYL CITRATE (PF) 250 MCG/5ML IJ SOLN
INTRAMUSCULAR | Status: AC
  Filled 2019-04-27: qty 5

## 2019-04-27 SURGICAL SUPPLY — 71 items
APL PRP STRL LF DISP 70% ISPRP (MISCELLANEOUS)
APPLIER CLIP 5 13 M/L LIGAMAX5 (MISCELLANEOUS)
APR CLP MED LRG 5 ANG JAW (MISCELLANEOUS)
BLADE CLIPPER SURG (BLADE) ×2 IMPLANT
CANISTER SUCT 3000ML PPV (MISCELLANEOUS) ×3 IMPLANT
CELLS DAT CNTRL 66122 CELL SVR (MISCELLANEOUS) IMPLANT
CHLORAPREP W/TINT 26 (MISCELLANEOUS) ×1 IMPLANT
CLIP APPLIE 5 13 M/L LIGAMAX5 (MISCELLANEOUS) IMPLANT
COVER SURGICAL LIGHT HANDLE (MISCELLANEOUS) ×6 IMPLANT
COVER WAND RF STERILE (DRAPES) ×1 IMPLANT
DRAPE LAPAROSCOPIC ABDOMINAL (DRAPES) ×3 IMPLANT
DRAPE WARM FLUID 44X44 (DRAPES) ×3 IMPLANT
DRSG OPSITE POSTOP 4X10 (GAUZE/BANDAGES/DRESSINGS) IMPLANT
DRSG OPSITE POSTOP 4X8 (GAUZE/BANDAGES/DRESSINGS) ×2 IMPLANT
ELECT BLADE 6.5 EXT (BLADE) IMPLANT
ELECT CAUTERY BLADE 6.4 (BLADE) ×6 IMPLANT
ELECT REM PT RETURN 9FT ADLT (ELECTROSURGICAL) ×3
ELECTRODE REM PT RTRN 9FT ADLT (ELECTROSURGICAL) ×1 IMPLANT
GAUZE 4X4 16PLY RFD (DISPOSABLE) IMPLANT
GEL ULTRASOUND 20GR AQUASONIC (MISCELLANEOUS) IMPLANT
GLOVE BIO SURGEON STRL SZ7 (GLOVE) ×6 IMPLANT
GLOVE BIOGEL PI IND STRL 7.5 (GLOVE) ×2 IMPLANT
GLOVE BIOGEL PI INDICATOR 7.5 (GLOVE) ×4
GLOVE SURG SS PI 6.5 STRL IVOR (GLOVE) ×4 IMPLANT
GLOVE SURG SS PI 7.0 STRL IVOR (GLOVE) ×4 IMPLANT
GOWN STRL REUS W/ TWL LRG LVL3 (GOWN DISPOSABLE) ×6 IMPLANT
GOWN STRL REUS W/ TWL XL LVL3 (GOWN DISPOSABLE) IMPLANT
GOWN STRL REUS W/TWL LRG LVL3 (GOWN DISPOSABLE) ×18
GOWN STRL REUS W/TWL XL LVL3 (GOWN DISPOSABLE) ×3
HANDLE SUCTION POOLE (INSTRUMENTS) ×1 IMPLANT
KIT BASIN OR (CUSTOM PROCEDURE TRAY) ×3 IMPLANT
KIT TURNOVER KIT B (KITS) ×3 IMPLANT
LIGASURE IMPACT 36 18CM CVD LR (INSTRUMENTS) ×2 IMPLANT
NS IRRIG 1000ML POUR BTL (IV SOLUTION) ×6 IMPLANT
PACK COLON (CUSTOM PROCEDURE TRAY) ×3 IMPLANT
PACK GENERAL/GYN (CUSTOM PROCEDURE TRAY) ×1 IMPLANT
PAD ARMBOARD 7.5X6 YLW CONV (MISCELLANEOUS) ×6 IMPLANT
PENCIL SMOKE EVACUATOR (MISCELLANEOUS) ×4 IMPLANT
RELOAD PROXIMATE 75MM BLUE (ENDOMECHANICALS) ×6 IMPLANT
RELOAD STAPLE 75 3.8 BLU REG (ENDOMECHANICALS) IMPLANT
RELOAD STAPLER LINE PROX 60 GR (STAPLE) ×1 IMPLANT
RETRACTOR WND ALEXIS 18 MED (MISCELLANEOUS) IMPLANT
RTRCTR WOUND ALEXIS 18CM MED (MISCELLANEOUS)
SET IRRIG TUBING LAPAROSCOPIC (IRRIGATION / IRRIGATOR) IMPLANT
SHEARS HARMONIC ACE PLUS 36CM (ENDOMECHANICALS) ×1 IMPLANT
SLEEVE ENDOPATH XCEL 5M (ENDOMECHANICALS) ×1 IMPLANT
SPECIMEN JAR LARGE (MISCELLANEOUS) ×3 IMPLANT
SPONGE INTESTINAL PEANUT (DISPOSABLE) IMPLANT
SPONGE LAP 18X18 RF (DISPOSABLE) ×6 IMPLANT
STAPLER PROXIMATE 75MM BLUE (STAPLE) ×2 IMPLANT
STAPLER RELOAD LINE PROX 60 GR (STAPLE) ×3
STAPLER RELOADABLE 60 GRN THCK (STAPLE) IMPLANT
STAPLER VISISTAT 35W (STAPLE) ×1 IMPLANT
SUCTION POOLE HANDLE (INSTRUMENTS) ×3
SURGILUBE 2OZ TUBE FLIPTOP (MISCELLANEOUS) IMPLANT
SUT PDS AB 1 TP1 96 (SUTURE) ×6 IMPLANT
SUT PROLENE 2 0 CT2 30 (SUTURE) IMPLANT
SUT PROLENE 2 0 KS (SUTURE) IMPLANT
SUT SILK 2 0 SH CR/8 (SUTURE) ×3 IMPLANT
SUT SILK 2 0 TIES 10X30 (SUTURE) ×3 IMPLANT
SUT SILK 3 0 SH CR/8 (SUTURE) ×5 IMPLANT
SUT SILK 3 0 TIES 10X30 (SUTURE) ×3 IMPLANT
SUT VIC AB 3-0 SH 18 (SUTURE) IMPLANT
SYS LAPSCP GELPORT 120MM (MISCELLANEOUS)
SYSTEM LAPSCP GELPORT 120MM (MISCELLANEOUS) IMPLANT
TOWEL GREEN STERILE (TOWEL DISPOSABLE) ×3 IMPLANT
TRAY FOLEY MTR SLVR 16FR STAT (SET/KITS/TRAYS/PACK) ×3 IMPLANT
TUBE CONNECTING 12'X1/4 (SUCTIONS) ×2
TUBE CONNECTING 12X1/4 (SUCTIONS) ×4 IMPLANT
WATER STERILE IRR 1000ML POUR (IV SOLUTION) ×3 IMPLANT
YANKAUER SUCT BULB TIP NO VENT (SUCTIONS) ×2 IMPLANT

## 2019-04-27 NOTE — Progress Notes (Signed)
[name] PROGRESS NOTE  RIDWAN LOO [id number] DOB: 11/16/1933 DOA: 04/24/2019 PCP: Marin Olp, MD  Brief summary   Calvin Burnett is a 85 y.o. male with medical history significant of diabetes, GERD, hypertension and prostate cancer who was recently admitted with intra-abdominal abscess due to perforated viscus from April 13, 2009 discharged home on antibiotics, history of A. fib, currently on Eliquis.  Hypertension GERD and Alzheimer's dementia, history of right-sided colectomy and sigmoidoscopy in May 2019, pneumoperitoneum who presented again with abdominal pain as well as fever.  Patient had temperature up to 102.  He was on Eliquis and no surgery was done previous admission.  His abscess was deemed not drainable at the time.  He was found to have worsening intra-abdominal abscess.  Patient has not been on antibiotics in the last few days.   Upon arrival to ED, his temperature was 100.5 blood pressure 100/67 pulse 98 respiratory 24 oxygen sat 95% on room air.  Sodium 135 potassium 3.3 chloride 103 CO2 22 glucose 142.  Creatinine 1.27 calcium 7.2.  Albumin 2.0.  White count 17.9 platelet lites 406 and hemoglobin 12.8.  COVID-19 screen is negative.  CT abdomen pelvis shows interval development of multiple intra-abdominal abscesses measure approximately 6.2 x 5.2 x 7.1 cm.  Multiple thick-walled loops of small bowel in the mid abdomen small volume pneumoperitoneum which is decreased from previous small bilateral pleural effusions with bladder wall thickening.    He was admitted under Dent.  Surgery was consulted.  He underwent intra-abdominal drain placement by IR on 04/25/2019.  He was continued on broad-spectrum IV antibiotics.  Assessment/Plan:  Intra-abdominal abscesses: recurrent infections. Ct abd: Interval development of multiple intra-abdominal abscesses as detailed above measuring up to approximately 6.2 x 5.2 by 7.1 cm. -cont iv antibiotics cefepime, vancomycin  and Flagyl. F/u culture which are negative so far.status post intra-abdominal drainage placed by IR on 04/25/2019.  It is my understanding that the plan for him is to have exploratory laparotomy today.  Appreciate surgery help.  Further management deferred to them.  Per my discussion with Dr. Georgette Dover, general surgery will assume care of this patient is primary and hospitalist service will continue to follow along his consulting service for management of medical problems.  Diabetes mellitus type II:  Blood sugar controlled, on low normal side.  Continue SSI.  Dementia: Stable.  He is alert and oriented.  Chronic kidney disease stage III: Also at baseline.  Continue monitoring  Paroxysmal atrial fibrillation: Rate is controlled.  In sinus rhythm.  Hold Eliquis.  Would recommend resuming anticoagulation as soon as possible when he is cleared by general surgery.  Hypertension:  Blood pressure stable but on the low side. Continue metoprolol with holding parameters.  Continue to hold amlodipine.   Code Status: full Family Communication: Plan of care discussed with patient.  He verbalized understanding.  No questions asked. Disposition Plan: TBD.  Per general surgery.   Consultants:  Surgery  IR  Procedures:  Intra-abdominal drain placement on 04/25/2019  Antibiotics: Anti-infectives (From admission, onward)   Start     Dose/Rate Route Frequency Ordered Stop   04/25/19 2200  [MAR Hold]  vancomycin (VANCOCIN) 1,250 mg in sodium chloride 0.9 % 250 mL IVPB     (MAR Hold since Tue 04/27/2019 at 0958.Hold Reason: Transfer to a Procedural area.)   1,250 mg 166.7 mL/hr over 90 Minutes Intravenous Every 24 hours 04/25/19 0158     04/25/19 2100  ceFEPIme (MAXIPIME) 2 g in  sodium chloride 0.9 % 100 mL IVPB  Status:  Discontinued     2 g 200 mL/hr over 30 Minutes Intravenous Every 24 hours 04/24/19 2213 04/25/19 0900   04/25/19 1400  [MAR Hold]  ceFEPIme (MAXIPIME) 2 g in sodium chloride 0.9 %  100 mL IVPB     (MAR Hold since Tue 04/27/2019 at 0958.Hold Reason: Transfer to a Procedural area.)   2 g 200 mL/hr over 30 Minutes Intravenous Every 12 hours 04/25/19 1341     04/25/19 1000  cefTRIAXone (ROCEPHIN) 2 g in sodium chloride 0.9 % 100 mL IVPB  Status:  Discontinued     2 g 200 mL/hr over 30 Minutes Intravenous Every 24 hours 04/25/19 0841 04/25/19 1335   04/25/19 0015  [MAR Hold]  metroNIDAZOLE (FLAGYL) IVPB 500 mg     (MAR Hold since Tue 04/27/2019 at 0958.Hold Reason: Transfer to a Procedural area.)   500 mg 100 mL/hr over 60 Minutes Intravenous Every 8 hours 04/25/19 0006     04/25/19 0000  vancomycin (VANCOCIN) 1,500 mg in sodium chloride 0.9 % 500 mL IVPB     1,500 mg 250 mL/hr over 120 Minutes Intravenous  Once 04/24/19 2355 04/25/19 0248   04/24/19 2215  ceFEPIme (MAXIPIME) 2 g in sodium chloride 0.9 % 100 mL IVPB  Status:  Discontinued     2 g 200 mL/hr over 30 Minutes Intravenous Every 24 hours 04/24/19 2211 04/24/19 2213   04/24/19 2100  ceFEPIme (MAXIPIME) 2 g in sodium chloride 0.9 % 100 mL IVPB     2 g 200 mL/hr over 30 Minutes Intravenous  Once 04/24/19 2046 04/24/19 2154   04/24/19 2100  metroNIDAZOLE (FLAGYL) IVPB 500 mg     500 mg 100 mL/hr over 60 Minutes Intravenous  Once 04/24/19 2046 04/24/19 2223       (indicate start date, and stop date if known)  HPI/Subjective: Patient seen and examined.  He has no complaints.  No abdominal pain..  Objective: Vitals:   04/27/19 0500 04/27/19 0738  BP: 129/78 126/81  Pulse: 79 73  Resp: 18 17  Temp: 98.2 F (36.8 C) 98 F (36.7 C)  SpO2: 95% 96%    Intake/Output Summary (Last 24 hours) at 04/27/2019 0959 Last data filed at 04/27/2019 0925 Gross per 24 hour  Intake 2502.09 ml  Output 1480 ml  Net 1022.09 ml   Filed Weights   04/25/19 1507 04/26/19 0406 04/27/19 0500  Weight: 72.1 kg 83.1 kg 84.2 kg    Exam:  General exam: Appears calm and comfortable  Respiratory system: Clear to  auscultation. Respiratory effort normal. Cardiovascular system: S1 & S2 heard, RRR. No JVD, murmurs, rubs, gallops or clicks. No pedal edema. Gastrointestinal system: Abdomen is nondistended, firm and tender at left lower quadrant, abdominal pain noted. No organomegaly or masses felt. Normal bowel sounds heard. Central nervous system: Alert and oriented. No focal neurological deficits. Extremities: Symmetric 5 x 5 power. Skin: No rashes, lesions or ulcers.  Psychiatry: Judgement and insight appear normal. Mood & affect appropriate.     Data Reviewed: Basic Metabolic Panel: Recent Labs  Lab 04/24/19 1848 04/25/19 0308 04/26/19 0655 04/27/19 0443  NA 132* 135 138 139  K 3.5 3.3* 3.6 3.5  CL 101 103 110 110  CO2 20* 22 19* 17*  GLUCOSE 173* 142* 117* 82  BUN 12 15 18 16   CREATININE 1.29* 1.27* 1.45* 1.12  CALCIUM 8.0* 7.2* 6.7* 7.1*   Liver Function Tests: Recent Labs  Lab  04/24/19 1848 04/25/19 0308  AST 26 19  ALT 25 20  ALKPHOS 91 81  BILITOT 1.1 1.7*  PROT 6.1* 5.3*  ALBUMIN 2.4* 2.0*   Recent Labs  Lab 04/24/19 1848  LIPASE 42   No results for input(s): AMMONIA in the last 168 hours. CBC: Recent Labs  Lab 04/24/19 1848 04/25/19 0308 04/26/19 0655 04/27/19 0443  WBC 17.9* 17.9* 13.2* 8.1  NEUTROABS  --   --   --  5.7  HGB 14.1 12.8* 11.0* 11.8*  HCT 39.8 35.5* 32.2* 34.3*  MCV 92.3 93.2 94.4 93.5  PLT 458* 406* 356 367   Cardiac Enzymes: No results for input(s): CKTOTAL, CKMB, CKMBINDEX, TROPONINI in the last 168 hours. BNP (last 3 results) No results for input(s): BNP in the last 8760 hours.  ProBNP (last 3 results) Recent Labs    01/29/19 0852  PROBNP 165.0*    CBG: Recent Labs  Lab 04/26/19 1127 04/26/19 1756 04/26/19 2132 04/27/19 0625 04/27/19 0944  GLUCAP 93 89 72 82 80    Recent Results (from the past 240 hour(s))  Urine culture     Status: Abnormal   Collection Time: 04/24/19  6:53 PM   Specimen: In/Out Cath Urine  Result  Value Ref Range Status   Specimen Description IN/OUT CATH URINE  Final   Special Requests   Final    added 2248 Performed at [facility], [place] [address] [place], [place] [address]    Culture 20,000 COLONIES/mL YEAST (A)  Final   Report Status 04/26/2019 FINAL  Final  Culture, blood (routine x 2)     Status: None (Preliminary result)   Collection Time: 04/24/19  9:00 PM   Specimen: BLOOD  Result Value Ref Range Status   Specimen Description BLOOD RIGHT ANTECUBITAL  Final   Special Requests   Final    BOTTLES DRAWN AEROBIC AND ANAEROBIC Blood Culture adequate volume   Culture  Setup Time   Final    GRAM POSITIVE COCCI IN CHAINS IN PAIRS IN BOTH AEROBIC AND ANAEROBIC BOTTLES CRITICAL RESULT CALLED TO, READ BACK BY AND VERIFIED WITH: Salli Real ED:[id number] 04/26/2019 Mena Goes Performed at [facility], [place] [address], [place], [place] [address]    Culture GRAM POSITIVE COCCI  Final   Report Status PENDING  Incomplete  Blood Culture ID Panel (Reflexed)     Status: Abnormal   Collection Time: 04/24/19  9:00 PM  Result Value Ref Range Status   Enterococcus species NOT DETECTED NOT DETECTED Final   Listeria monocytogenes NOT DETECTED NOT DETECTED Final   Staphylococcus species NOT DETECTED NOT DETECTED Final   Staphylococcus aureus (BCID) NOT DETECTED NOT DETECTED Final   Streptococcus species DETECTED (A) NOT DETECTED Final    Comment: Not Enterococcus species, Streptococcus agalactiae, Streptococcus pyogenes, or Streptococcus pneumoniae. CRITICAL RESULT CALLED TO, READ BACK BY AND VERIFIED WITH: G. ABBOTT,PHARMD ED:[id number] 04/26/2019 T. TYSOR    Streptococcus agalactiae NOT DETECTED NOT DETECTED Final   Streptococcus pneumoniae NOT DETECTED NOT DETECTED Final   Streptococcus pyogenes NOT DETECTED NOT DETECTED Final   Acinetobacter baumannii NOT DETECTED NOT DETECTED Final   Enterobacteriaceae species NOT DETECTED NOT DETECTED Final   Enterobacter cloacae complex NOT DETECTED  NOT DETECTED Final   Escherichia coli NOT DETECTED NOT DETECTED Final   Klebsiella oxytoca NOT DETECTED NOT DETECTED Final   Klebsiella pneumoniae NOT DETECTED NOT DETECTED Final   Proteus species NOT DETECTED NOT DETECTED Final   Serratia marcescens NOT DETECTED  NOT DETECTED Final   Haemophilus influenzae NOT DETECTED NOT DETECTED Final   Neisseria meningitidis NOT DETECTED NOT DETECTED Final   Pseudomonas aeruginosa NOT DETECTED NOT DETECTED Final   Candida albicans NOT DETECTED NOT DETECTED Final   Candida glabrata NOT DETECTED NOT DETECTED Final   Candida krusei NOT DETECTED NOT DETECTED Final   Candida parapsilosis NOT DETECTED NOT DETECTED Final   Candida tropicalis NOT DETECTED NOT DETECTED Final    Comment: Performed at [facility], [place] [address], [place], [place] [address]  Culture, blood (routine x 2)     Status: None (Preliminary result)   Collection Time: 04/24/19  9:10 PM   Specimen: BLOOD LEFT FOREARM  Result Value Ref Range Status   Specimen Description BLOOD LEFT FOREARM  Final   Special Requests   Final    BOTTLES DRAWN AEROBIC AND ANAEROBIC Blood Culture adequate volume   Culture   Final    NO GROWTH 2 DAYS Performed at [facility], [place] [address], [place], [place] [address]    Report Status PENDING  Incomplete  SARS CORONAVIRUS 2 (TAT 6-24 HRS) Nasopharyngeal Nasopharyngeal Swab     Status: None   Collection Time: 04/25/19 12:11 AM   Specimen: Nasopharyngeal Swab  Result Value Ref Range Status   SARS Coronavirus 2 NEGATIVE NEGATIVE Final    Comment: (NOTE) SARS-CoV-2 target nucleic acids are NOT DETECTED. The SARS-CoV-2 RNA is generally detectable in upper and lower respiratory specimens during the acute phase of infection. Negative results do not preclude SARS-CoV-2 infection, do not rule out co-infections with other pathogens, and should not be used as the sole basis for treatment or other patient management decisions. Negative results must  be combined with clinical observations, patient history, and epidemiological information. The expected result is Negative. Fact Sheet for Patients: SugarRoll.be Fact Sheet for Healthcare Providers: https://www.woods-mathews.com/ This test is not yet approved or cleared by the [place] FDA and  has been authorized for detection and/or diagnosis of SARS-CoV-2 by FDA under an Emergency Use Authorization (EUA). This EUA will remain  in effect (meaning this test can be used) for the duration of the COVID-19 declaration under Section 56 4(b)(1) of the Act, 21 U.S.C. section 360bbb-3(b)(1), unless the authorization is terminated or revoked sooner. Performed at [facility], [place] [address], [place], [place] [address]   Aerobic/Anaerobic Culture (surgical/deep wound)     Status: None (Preliminary result)   Collection Time: 04/25/19  2:40 PM   Specimen: Abscess  Result Value Ref Range Status   Specimen Description ABSCESS  Final   Special Requests Normal  Final   Gram Stain   Final    MODERATE WBC PRESENT, PREDOMINANTLY PMN ABUNDANT GRAM NEGATIVE RODS MODERATE GRAM POSITIVE COCCI    Culture   Final    CULTURE REINCUBATED FOR BETTER GROWTH Performed at [facility], [place] [address] [place], [place] [address]    Report Status PENDING  Incomplete     Studies: Ct Image Guided Drainage Percut Cath  Peritoneal Retroperit  Result Date: 04/25/2019 CLINICAL DATA:  Previous right partial colectomy May 2019, pneumoperitoneum November 2019 and again September thirtieth common now with worsening abdominal pain, CT demonstrating multiple loculated fluid collections suggesting abscesses, large 7.1 cm left lower quadrant. EXAM: CT GUIDED DRAINAGE OF PERITONEAL ABSCESS ANESTHESIA/SEDATION: Intravenous Fentanyl 10mcg and Versed 1.5mg  were administered as conscious sedation during continuous monitoring of the patient's level of consciousness and  physiological / cardiorespiratory status by the  radiology RN, with a total moderate sedation time of 11 minutes. PROCEDURE: The procedure, risks, benefits, and alternatives were explained to the patient. Questions regarding the procedure were encouraged and answered. The patient understands and consents to the procedure. Select axial scans through the lower abdomen were obtained. The dominant left lower quadrant collection was localized and an appropriate skin entry site was determined and marked. The operative field was prepped with chlorhexidinein a sterile fashion, and a sterile drape was applied covering the operative field. A sterile gown and sterile gloves were used for the procedure. Local anesthesia was provided with 1% Lidocaine. Under CT fluoroscopic guidance, 18 gauge trocar needle advanced into the collection. Purulent material could be aspirated. Amplatz wire advanced easily within the collection, its position confirmed on CT. Tract dilated to facilitate placement of a 10 French pigtail drain catheter, placed within the dependent aspect of the collection. 30 mL purulent material were aspirated, sent for Gram stain and culture. Catheter position confirmed on CT. Catheter secured externally with 0 Prolene suture and StatLock and placed to gravity drain bag. The patient tolerated the procedure well. COMPLICATIONS: None immediate FINDINGS: Left lower quadrant complex loculated collection was localized. 10 French pigtail drain catheter placed as above. Sample of the aspirate sent for Gram stain and culture. IMPRESSION: 1. Technically successful CT-guided left lower quadrant peritoneal abscess drain catheter placement. Electronically Signed   By: Lucrezia Europe M.D.   On: 04/25/2019 15:04    Scheduled Meds: . [MAR Hold] insulin aspart  0-9 Units Subcutaneous TID WC  . [MAR Hold] latanoprost  1 drop Both Eyes QHS  . [MAR Hold] memantine  28 mg Oral Daily  . [MAR Hold] metoprolol succinate  12.5 mg Oral  Daily  . [MAR Hold] pantoprazole (PROTONIX) IV  40 mg Intravenous Daily  . [MAR Hold] sodium chloride flush  5 mL Intracatheter Q8H   Continuous Infusions: . sodium chloride 125 mL/hr at 04/27/19 0919  . [MAR Hold] ceFEPime (MAXIPIME) IV 2 g (04/27/19 0144)  . [MAR Hold] metronidazole 500 mg (04/27/19 0618)  . [MAR Hold] vancomycin Stopped (04/27/19 0001)    Principal Problem:   Intra-abdominal abscess ([place]) Active Problems:   Essential hypertension   GERD (gastroesophageal reflux disease)   Type 2 diabetes mellitus ([name])   Prostate cancer ([name])   Hyperlipidemia associated with type 2 diabetes mellitus ([place])   CKD (chronic kidney disease), stage III ([name])   AF (paroxysmal atrial fibrillation) ([place])   Perforation bowel ([place])   Dementia ([place])   Pneumoperitoneum  Time spent: 29 minutes  [place]  [name] If 7PM-7AM, please contact night-coverage at [email or web address], password [facility] 04/27/2019, 9:59 AM  LOS: 3 days

## 2019-04-27 NOTE — Anesthesia Procedure Notes (Signed)
Procedure Name: Intubation Date/Time: 04/27/2019 10:44 AM Performed by: Myna Bright, CRNA Pre-anesthesia Checklist: Patient identified, Emergency Drugs available, Suction available and Patient being monitored Patient Re-evaluated:Patient Re-evaluated prior to induction Oxygen Delivery Method: Circle system utilized Preoxygenation: Pre-oxygenation with 100% oxygen Induction Type: IV induction and Cricoid Pressure applied Ventilation: Mask ventilation without difficulty Laryngoscope Size: Mac and 4 Grade View: Grade I Tube type: Oral Tube size: 7.5 mm Number of attempts: 1 Airway Equipment and Method: Stylet Placement Confirmation: ETT inserted through vocal cords under direct vision,  positive ETCO2 and breath sounds checked- equal and bilateral Secured at: 22 cm Tube secured with: Tape Dental Injury: Teeth and Oropharynx as per pre-operative assessment

## 2019-04-27 NOTE — Progress Notes (Signed)
Arrived to discuss PICC placement including risks, benefits, and alternatives. Patient requested waiting until AM so he could discuss with MD and family. Primary RN notified.

## 2019-04-27 NOTE — Transfer of Care (Signed)
Immediate Anesthesia Transfer of Care Note  Patient: Calvin Burnett  Procedure(s) Performed: EXPLORATORY LAPAROTOMY WITH LYSIS OF ADHESIONS (N/A Abdomen) SMALL BOWEL RESECTION (N/A Abdomen)  Patient Location: PACU  Anesthesia Type:General  Level of Consciousness: awake, alert  and patient cooperative  Airway & Oxygen Therapy: Patient Spontanous Breathing and Patient connected to face mask oxygen  Post-op Assessment: Report given to RN and Post -op Vital signs reviewed and stable  Post vital signs: Reviewed and stable  Last Vitals:  Vitals Value Taken Time  BP 123/79 04/27/19 1356  Temp 36.5 C 04/27/19 1330  Pulse 89 04/27/19 1402  Resp 19 04/27/19 1402  SpO2 94 % 04/27/19 1402  Vitals shown include unvalidated device data.  Last Pain:  Vitals:   04/27/19 1330  TempSrc:   PainSc: 0-No pain         Complications: No apparent anesthesia complications

## 2019-04-27 NOTE — Progress Notes (Signed)
[place] NOTE   Pharmacy Consult for TPN Indication: Prolonged Ileus   Patient Measurements: Height: 5\' 10"  (177.8 cm) Weight: 185 lb 11.2 oz (84.2 kg)(scale c) IBW/kg (Calculated) : 73 TPN AdjBW (KG): 72.1 Body mass index is 26.65 kg/m.   Assessment: 85 year old male admitted 04/24/19 with intra-abdominal abscesses secondary to small bowel perforation/infection now s/p exploratory laparotomy, extensive lysis of adhesions, and small bowel resection with primary anastomosis. Pharmacy consulted to start TPN for prolonged ileus post-operatively.   GI: POD0. LBM 10/11. Pre-albumin <5 on 10/12.  Endo: CBGs 70-80s. Decadron 5mg  given. Hypoglycemia protocol in place.  Insulin requirements in the past 24 hours: 1 unit Lytes: K 3.5, CoCa 9.  Renal: SCr 1.45 >>1.12.  Pulm: 5L O2 Cards: VSS Hepatobil: LFTs within normal limits. Tbili up at 1.7.  Neuro: Prn Fentanyl.  ID: Vancomycin, Flagyl, and Cefepime for intra-abdominal infection/small bowel perforation. Afebrile, WBC wnl.   TPN Access: PICC order pending placement TPN start date: 04/28/19 Nutritional Goals (per RD recommendation on pending): kCal: Protein:  Fluid:   Current Nutrition: NPO  Plan:  TPN ordered past TPN compounding cut-off time of 12 PM and currently no access - will start TPN on 04/28/19 pending access is obtained.  Labs ordered for tomorrow.  Consider dextrose infusion overnight/AM if needed to prevent hypoglycemia.   Sloan Leiter, PharmD, BCPS, BCCCP Clinical Pharmacist Clinical phone 04/27/2019 until 3P [phone] Please refer to [facility] for [place] numbers 04/27/2019,2:54 PM

## 2019-04-27 NOTE — Op Note (Signed)
Preop diagnosis: Intra-abdominal abscesses with recent history of pneumoperitoneum Postop diagnosis: Intra-abdominal abscesses secondary to small bowel perforation/infection Procedure performed: Exploratory laparotomy, extensive lysis of adhesions (greater than 60 minutes), small bowel resection with primary anastomosis Surgeon:Mosetta Ferdinand K Chiquita Heckert Assistant: Dr. Fanny Skates Anesthesia: General endotracheal Indications: This is an 85 year old male who is status post previous colon resection with ileocolic anastomosis.  He has had 3 previous admissions for pneumoperitoneum but the source is unclear.  He did have a colonoscopy in July that showed a patent side-to-side ileocolic anastomosis with no sign of bleeding or infection.  The patient was managed nonoperatively in his previous hospital admissions.  However he presented to the emergency department on 04/24/2019 with fever and abdominal tenderness.  White count was elevated.  A CT scan showed 2 separate intra-abdominal abscesses among the loops of small bowel.  He underwent percutaneous drainage of the larger abscess.  Initially this showed some purulent fluid but subsequently the fluid became green indicative of bile.  After extensive discussion with the patient and his son, we made the decision to proceed with exploratory laparotomy with probable bowel resection.  The patient does suffer from some dementia but seems to indicate that he understands the plan.  Description of procedure: The patient is brought to the operating room and placed in the supine position on the operating room table.  After an adequate level of general anesthesia was obtained, a Foley catheter was placed under sterile technique.  The patient's abdomen was prepped with Betadine and draped in sterile fashion.  The field included the percutaneous pigtail drain that had been placed by interventional radiology.  A timeout was taken to ensure the proper patient and proper procedure.  We  made a vertical midline incision.  We dissected down to the fascia.  We carefully dissected through the linea alba.  We encountered dense adhesions of the omentum to the anterior abdominal wall.  With meticulous dissection, we are able to begin dissecting the omentum away from the abdominal wall.  The omentum feels quite thickened.  We dissected towards the patient's left side and entered an abscess cavity.  There is minimal residual purulent fluid.  The pigtail catheter was removed.  I did not see any sign of stool or succus entericus in the abscess cavity.  We then dissected towards the patient's right side.  We encountered another smaller abscess measuring about 4 cm across.  This contained a small amount of purulent fluid but no stool or succus entericus.  We were able to finally clear the anterior abdominal wall completely.  The Bookwalter retractor was inserted to provide exposure.  We then carefully dissected the omentum away from the underlying small bowel.  The omentum was quite thickened as are several loops of small bowel that are directly behind this area.  We were finally able to free up all of the small bowel beginning at the ligament of Treitz.  The proximal jejunum is soft and free of any disease.  We did not notice any jejunal diverticulum.  We ran the small bowel distally.  We encountered the area of thickening and abscess in the distal jejunum/proximal ileum.  Once we got past this area the small bowel appears relatively normal until it reaches the ileocolic anastomosis at the proximal transverse colon.  The visualized portions of the colon appear soft and normal.  There are some adhesions in the upper abdominal wall that were left alone.  The visualized portion of the stomach appears normal.  The patient  has had a previous open cholecystectomy.  His liver appears normal.  The nasogastric tube is palpated within the stomach.  After defining the anatomy, we made the decision to perform a small  bowel resection of the diseased segment.  We divided proximal and distal to this 30 cm loop of small bowel with a GIA-75 stapler.  The mesentery was divided with the LigaSure device.  A side-to-side anastomosis was created with a GIA-75 and a TA 60 stapler.  The staple line was oversewn with 3-0 silk sutures.  The crotch of the anastomosis was reinforced with 3-0 silk.  2-0 silk was used to close the mesenteric defect.  The anastomosis was palpated and was widely patent.  We then irrigated the abdomen thoroughly with several liters of warm saline.  We inspected for hemostasis.  The sponge counts were correct.  We closed the fascia with double-stranded #1 PDS suture.  We thoroughly irrigated the subcutaneous tissues and staples were used to close the skin.  Dry dressing was applied.  A dry dressing was placed over the previous drain site.  Calvin Burnett. Calvin Dover, MD, [facility] Surgery  General/ Trauma Surgery Beeper [phone]  04/27/2019 1:37 PM

## 2019-04-27 NOTE — Progress Notes (Signed)
Pt son on unit to pt belongings, made sure he got pts wedding band in his hand, double checked room and nothing else of personal belongings

## 2019-04-27 NOTE — Anesthesia Postprocedure Evaluation (Signed)
Anesthesia Post Note  Patient: Calvin Burnett  Procedure(s) Performed: EXPLORATORY LAPAROTOMY WITH LYSIS OF ADHESIONS (N/A Abdomen) SMALL BOWEL RESECTION (N/A Abdomen)     Patient location during evaluation: PACU Anesthesia Type: General Level of consciousness: awake and alert Pain management: pain level controlled Vital Signs Assessment: post-procedure vital signs reviewed and stable Respiratory status: spontaneous breathing, nonlabored ventilation, respiratory function stable and patient connected to nasal cannula oxygen Cardiovascular status: blood pressure returned to baseline and stable Postop Assessment: no apparent nausea or vomiting Anesthetic complications: no    Last Vitals:  Vitals:   04/27/19 1407 04/27/19 1442  BP: 125/71 130/77  Pulse:  91  Resp: (!) 24 20  Temp:    SpO2: 94% 94%    Last Pain:  Vitals:   04/27/19 1330  TempSrc:   PainSc: 0-No pain                 Barnet Glasgow

## 2019-04-27 NOTE — Progress Notes (Addendum)
Report called to Robert in preop, blood sugar, pt has been NPO> CHG bath completed, mrsa PCR done, pt stable on transfer to OR, left message for his son Ronalee Belts that pt belongings on 3E and pt already gone to OR and number to call me

## 2019-04-27 NOTE — Anesthesia Preprocedure Evaluation (Addendum)
Anesthesia Evaluation  Patient identified by MRN, date of birth, ID band Patient awake    Reviewed: Allergy & Precautions, NPO status , Patient's Chart, lab work & pertinent test results  Airway Mallampati: II  TM Distance: >3 FB Neck ROM: Full    Dental no notable dental hx. (+) Teeth Intact, Dental Advisory Given   Pulmonary neg pulmonary ROS, former smoker,    Pulmonary exam normal breath sounds clear to auscultation       Cardiovascular Exercise Tolerance: Good hypertension, Pt. on medications and Pt. on home beta blockers Normal cardiovascular exam+ dysrhythmias Atrial Fibrillation  Rhythm:Regular Rate:Normal  6/16 echo Left ventricle: The cavity size was normal. Systolic function was  normal. The estimated ejection fraction was in the range of 55%  to 60%. Wall motion was normal; there were no regional wall  motion abnormalities.    Neuro/Psych Depression Dementia negative neurological ROS     GI/Hepatic Neg liver ROS, GERD  ,  Endo/Other  diabetes, Type 2  Renal/GU Renal diseaseK+ 3.5 Cr 1.12     Musculoskeletal  (+) Arthritis ,   Abdominal   Peds  Hematology negative hematology ROS (+) Hgb 11.8 Plt 367   Anesthesia Other Findings   Reproductive/Obstetrics negative OB ROS                          Anesthesia Physical Anesthesia Plan  ASA: III  Anesthesia Plan: General   Post-op Pain Management:    Induction: Intravenous  PONV Risk Score and Plan: 3 and Treatment may vary due to age or medical condition, Ondansetron and Dexamethasone  Airway Management Planned: Oral ETT  Additional Equipment:   Intra-op Plan:   Post-operative Plan: Extubation in OR  Informed Consent: I have reviewed the patients History and Physical, chart, labs and discussed the procedure including the risks, benefits and alternatives for the proposed anesthesia with the patient or authorized  representative who has indicated his/her understanding and acceptance.     Dental advisory given  Plan Discussed with: CRNA  Anesthesia Plan Comments:        Anesthesia Quick Evaluation

## 2019-04-27 NOTE — Progress Notes (Signed)
Subjective/Chief Complaint: Patient still with some right-sided abdominal tenderness. Afebrile Resting comfortably Drain with greenish drainage  Objective: Vital signs in last 24 hours: Temp:  [97.9 F (36.6 C)-98.8 F (37.1 C)] 98 F (36.7 C) (10/13 0738) Pulse Rate:  [71-81] 73 (10/13 0738) Resp:  [17-20] 17 (10/13 0738) BP: (122-129)/(78-83) 126/81 (10/13 0738) SpO2:  [95 %-97 %] 96 % (10/13 0738) Weight:  [84.2 kg] 84.2 kg (10/13 0500) Last BM Date: 04/25/19  Intake/Output from previous day: 10/12 0701 - 10/13 0700 In: 2110 [P.O.:300; I.V.:1805] Out: 1280 [Urine:1250; Drains:30] Intake/Output this shift: Total I/O In: 5 [I.V.:5] Out: -   General appearance: alert, cooperative and no distress GI: soft, moderate right-sided tenderness; drain with thin greenish drainage  Lab Results:  Recent Labs    04/26/19 0655 04/27/19 0443  WBC 13.2* 8.1  HGB 11.0* 11.8*  HCT 32.2* 34.3*  PLT 356 367   BMET Recent Labs    04/26/19 0655 04/27/19 0443  NA 138 139  K 3.6 3.5  CL 110 110  CO2 19* 17*  GLUCOSE 117* 82  BUN 18 16  CREATININE 1.45* 1.12  CALCIUM 6.7* 7.1*   PT/INR Recent Labs    04/24/19 2100  LABPROT 22.5*  INR 2.0*   ABG No results for input(s): PHART, HCO3 in the last 72 hours.  Invalid input(s): PCO2, PO2  Studies/Results: Ct Image Guided Drainage Percut Cath  Peritoneal Retroperit  Result Date: 04/25/2019 CLINICAL DATA:  Previous right partial colectomy May 2019, pneumoperitoneum November 2019 and again September thirtieth common now with worsening abdominal pain, CT demonstrating multiple loculated fluid collections suggesting abscesses, large 7.1 cm left lower quadrant. EXAM: CT GUIDED DRAINAGE OF PERITONEAL ABSCESS ANESTHESIA/SEDATION: Intravenous Fentanyl 69mcg and Versed 1.5mg  were administered as conscious sedation during continuous monitoring of the patient's level of consciousness and physiological / cardiorespiratory status by  the radiology RN, with a total moderate sedation time of 11 minutes. PROCEDURE: The procedure, risks, benefits, and alternatives were explained to the patient. Questions regarding the procedure were encouraged and answered. The patient understands and consents to the procedure. Select axial scans through the lower abdomen were obtained. The dominant left lower quadrant collection was localized and an appropriate skin entry site was determined and marked. The operative field was prepped with chlorhexidinein a sterile fashion, and a sterile drape was applied covering the operative field. A sterile gown and sterile gloves were used for the procedure. Local anesthesia was provided with 1% Lidocaine. Under CT fluoroscopic guidance, 18 gauge trocar needle advanced into the collection. Purulent material could be aspirated. Amplatz wire advanced easily within the collection, its position confirmed on CT. Tract dilated to facilitate placement of a 10 French pigtail drain catheter, placed within the dependent aspect of the collection. 30 mL purulent material were aspirated, sent for Gram stain and culture. Catheter position confirmed on CT. Catheter secured externally with 0 Prolene suture and StatLock and placed to gravity drain bag. The patient tolerated the procedure well. COMPLICATIONS: None immediate FINDINGS: Left lower quadrant complex loculated collection was localized. 10 French pigtail drain catheter placed as above. Sample of the aspirate sent for Gram stain and culture. IMPRESSION: 1. Technically successful CT-guided left lower quadrant peritoneal abscess drain catheter placement. Electronically Signed   By: Lucrezia Europe M.D.   On: 04/25/2019 15:04    Anti-infectives: Anti-infectives (From admission, onward)   Start     Dose/Rate Route Frequency Ordered Stop   04/25/19 2200  vancomycin (VANCOCIN) 1,250 mg in sodium  chloride 0.9 % 250 mL IVPB     1,250 mg 166.7 mL/hr over 90 Minutes Intravenous Every 24 hours  04/25/19 0158     04/25/19 2100  ceFEPIme (MAXIPIME) 2 g in sodium chloride 0.9 % 100 mL IVPB  Status:  Discontinued     2 g 200 mL/hr over 30 Minutes Intravenous Every 24 hours 04/24/19 2213 04/25/19 0900   04/25/19 1400  ceFEPIme (MAXIPIME) 2 g in sodium chloride 0.9 % 100 mL IVPB     2 g 200 mL/hr over 30 Minutes Intravenous Every 12 hours 04/25/19 1341     04/25/19 1000  cefTRIAXone (ROCEPHIN) 2 g in sodium chloride 0.9 % 100 mL IVPB  Status:  Discontinued     2 g 200 mL/hr over 30 Minutes Intravenous Every 24 hours 04/25/19 0841 04/25/19 1335   04/25/19 0015  metroNIDAZOLE (FLAGYL) IVPB 500 mg     500 mg 100 mL/hr over 60 Minutes Intravenous Every 8 hours 04/25/19 0006     04/25/19 0000  vancomycin (VANCOCIN) 1,500 mg in sodium chloride 0.9 % 500 mL IVPB     1,500 mg 250 mL/hr over 120 Minutes Intravenous  Once 04/24/19 2355 04/25/19 0248   04/24/19 2215  ceFEPIme (MAXIPIME) 2 g in sodium chloride 0.9 % 100 mL IVPB  Status:  Discontinued     2 g 200 mL/hr over 30 Minutes Intravenous Every 24 hours 04/24/19 2211 04/24/19 2213   04/24/19 2100  ceFEPIme (MAXIPIME) 2 g in sodium chloride 0.9 % 100 mL IVPB     2 g 200 mL/hr over 30 Minutes Intravenous  Once 04/24/19 2046 04/24/19 2154   04/24/19 2100  metroNIDAZOLE (FLAGYL) IVPB 500 mg     500 mg 100 mL/hr over 60 Minutes Intravenous  Once 04/24/19 2046 04/24/19 2223      Assessment/Plan: Intra-abdominal abscess probably secondary to bowel perforation (more likely small bowel)  Plan exploratory laparotomy/ possible bowel resection today.  Will be able to restart anticoagulation on POD#1 probably.    LOS: 3 days    Calvin Burnett 04/27/2019

## 2019-04-28 ENCOUNTER — Encounter (HOSPITAL_COMMUNITY): Payer: Self-pay | Admitting: Surgery

## 2019-04-28 ENCOUNTER — Inpatient Hospital Stay (HOSPITAL_COMMUNITY): Payer: PPO

## 2019-04-28 LAB — COMPREHENSIVE METABOLIC PANEL
ALT: 25 U/L (ref 0–44)
AST: 33 U/L (ref 15–41)
Albumin: 2 g/dL — ABNORMAL LOW (ref 3.5–5.0)
Alkaline Phosphatase: 90 U/L (ref 38–126)
Anion gap: 11 (ref 5–15)
BUN: 21 mg/dL (ref 8–23)
CO2: 16 mmol/L — ABNORMAL LOW (ref 22–32)
Calcium: 7.4 mg/dL — ABNORMAL LOW (ref 8.9–10.3)
Chloride: 113 mmol/L — ABNORMAL HIGH (ref 98–111)
Creatinine, Ser: 1.34 mg/dL — ABNORMAL HIGH (ref 0.61–1.24)
GFR calc Af Amer: 56 mL/min — ABNORMAL LOW (ref >60)
GFR calc non Af Amer: 49 mL/min — ABNORMAL LOW (ref >60)
Glucose, Bld: 154 mg/dL — ABNORMAL HIGH (ref 70–99)
Potassium: 4.7 mmol/L (ref 3.5–5.1)
Sodium: 140 mmol/L (ref 135–145)
Total Bilirubin: 1.3 mg/dL — ABNORMAL HIGH (ref 0.3–1.2)
Total Protein: 5.1 g/dL — ABNORMAL LOW (ref 6.5–8.1)

## 2019-04-28 LAB — CULTURE, BLOOD (ROUTINE X 2)
Special Requests: ADEQUATE
Special Requests: ADEQUATE

## 2019-04-28 LAB — CBC WITH DIFFERENTIAL/PLATELET
Abs Immature Granulocytes: 0.23 10*3/uL — ABNORMAL HIGH (ref 0.00–0.07)
Basophils Absolute: 0.1 10*3/uL (ref 0.0–0.1)
Basophils Relative: 0 %
Eosinophils Absolute: 0 10*3/uL (ref 0.0–0.5)
Eosinophils Relative: 0 %
HCT: 37.3 % — ABNORMAL LOW (ref 39.0–52.0)
Hemoglobin: 12.7 g/dL — ABNORMAL LOW (ref 13.0–17.0)
Immature Granulocytes: 2 %
Lymphocytes Relative: 11 %
Lymphs Abs: 1.7 10*3/uL (ref 0.7–4.0)
MCH: 31.9 pg (ref 26.0–34.0)
MCHC: 34 g/dL (ref 30.0–36.0)
MCV: 93.7 fL (ref 80.0–100.0)
Monocytes Absolute: 0.8 10*3/uL (ref 0.1–1.0)
Monocytes Relative: 5 %
Neutro Abs: 12.9 10*3/uL — ABNORMAL HIGH (ref 1.7–7.7)
Neutrophils Relative %: 82 %
Platelets: 508 10*3/uL — ABNORMAL HIGH (ref 150–400)
RBC: 3.98 MIL/uL — ABNORMAL LOW (ref 4.22–5.81)
RDW: 14.5 % (ref 11.5–15.5)
WBC: 15.8 10*3/uL — ABNORMAL HIGH (ref 4.0–10.5)
nRBC: 0 % (ref 0.0–0.2)

## 2019-04-28 LAB — GLUCOSE, CAPILLARY
Glucose-Capillary: 129 mg/dL — ABNORMAL HIGH (ref 70–99)
Glucose-Capillary: 132 mg/dL — ABNORMAL HIGH (ref 70–99)
Glucose-Capillary: 147 mg/dL — ABNORMAL HIGH (ref 70–99)
Glucose-Capillary: 202 mg/dL — ABNORMAL HIGH (ref 70–99)

## 2019-04-28 LAB — PHOSPHORUS: Phosphorus: 3.5 mg/dL (ref 2.5–4.6)

## 2019-04-28 LAB — TRIGLYCERIDES: Triglycerides: 73 mg/dL (ref <150)

## 2019-04-28 LAB — PREALBUMIN: Prealbumin: 5.8 mg/dL — ABNORMAL LOW (ref 18–38)

## 2019-04-28 LAB — MAGNESIUM: Magnesium: 1.9 mg/dL (ref 1.7–2.4)

## 2019-04-28 MED ORDER — INSULIN ASPART 100 UNIT/ML ~~LOC~~ SOLN
0.0000 [IU] | Freq: Four times a day (QID) | SUBCUTANEOUS | Status: DC
  Administered 2019-04-28: 1 [IU] via SUBCUTANEOUS
  Administered 2019-04-28: 3 [IU] via SUBCUTANEOUS
  Administered 2019-04-28: 1 [IU] via SUBCUTANEOUS
  Administered 2019-04-29: 07:00:00 3 [IU] via SUBCUTANEOUS

## 2019-04-28 MED ORDER — METOPROLOL TARTRATE 5 MG/5ML IV SOLN
5.0000 mg | Freq: Three times a day (TID) | INTRAVENOUS | Status: DC
  Administered 2019-04-28 – 2019-05-02 (×12): 5 mg via INTRAVENOUS
  Filled 2019-04-28 (×12): qty 5

## 2019-04-28 MED ORDER — TRAVASOL 10 % IV SOLN
INTRAVENOUS | Status: AC
  Administered 2019-04-28: 19:00:00 via INTRAVENOUS
  Filled 2019-04-28: qty 547.2

## 2019-04-28 NOTE — Progress Notes (Signed)
1 Day Post-Op   Subjective/Chief Complaint: Patient awake, alert Reports minimal pain No nausea - NG tube seems to be functioning   Objective: Vital signs in last 24 hours: Temp:  [97.5 F (36.4 C)-97.7 F (36.5 C)] 97.5 F (36.4 C) (10/14 0335) Pulse Rate:  [63-92] 92 (10/14 0335) Resp:  [16-24] 20 (10/14 0335) BP: (123-131)/(71-89) 129/89 (10/14 0335) SpO2:  [94 %-97 %] 97 % (10/14 0335) Last BM Date: 04/21/19  Intake/Output from previous day: 10/13 0701 - 10/14 0700 In: 2879.1 [P.O.:30; I.V.:2599.1; IV Piggyback:250] Out: 1550 [Urine:1200; Emesis/NG output:150; Blood:200] Intake/Output this shift: No intake/output data recorded.  General appearance: alert, cooperative and no distress GI: mildly distended; incisional tenderness Dressing dry - minimal drainage  Lab Results:  Recent Labs    04/27/19 0443 04/28/19 0434  WBC 8.1 15.8*  HGB 11.8* 12.7*  HCT 34.3* 37.3*  PLT 367 508*   BMET Recent Labs    04/27/19 0443 04/28/19 0434  NA 139 140  K 3.5 4.7  CL 110 113*  CO2 17* 16*  GLUCOSE 82 154*  BUN 16 21  CREATININE 1.12 1.34*  CALCIUM 7.1* 7.4*   PT/INR No results for input(s): LABPROT, INR in the last 72 hours. ABG No results for input(s): PHART, HCO3 in the last 72 hours.  Invalid input(s): PCO2, PO2  Studies/Results: [place] Ekg Site Rite  Result Date: 04/27/2019 If Site Rite image not attached, placement could not be confirmed due to current cardiac rhythm.   Anti-infectives: Anti-infectives (From admission, onward)   Start     Dose/Rate Route Frequency Ordered Stop   04/27/19 1700  piperacillin-tazobactam (ZOSYN) IVPB 3.375 g     3.375 g 12.5 mL/hr over 240 Minutes Intravenous Every 8 hours 04/27/19 1640     04/25/19 2200  vancomycin (VANCOCIN) 1,250 mg in sodium chloride 0.9 % 250 mL IVPB  Status:  Discontinued     1,250 mg 166.7 mL/hr over 90 Minutes Intravenous Every 24 hours 04/25/19 0158 04/27/19 1640   04/25/19 2100  ceFEPIme  (MAXIPIME) 2 g in sodium chloride 0.9 % 100 mL IVPB  Status:  Discontinued     2 g 200 mL/hr over 30 Minutes Intravenous Every 24 hours 04/24/19 2213 04/25/19 0900   04/25/19 1400  ceFEPIme (MAXIPIME) 2 g in sodium chloride 0.9 % 100 mL IVPB  Status:  Discontinued     2 g 200 mL/hr over 30 Minutes Intravenous Every 12 hours 04/25/19 1341 04/27/19 1640   04/25/19 1000  cefTRIAXone (ROCEPHIN) 2 g in sodium chloride 0.9 % 100 mL IVPB  Status:  Discontinued     2 g 200 mL/hr over 30 Minutes Intravenous Every 24 hours 04/25/19 0841 04/25/19 1335   04/25/19 0015  metroNIDAZOLE (FLAGYL) IVPB 500 mg  Status:  Discontinued     500 mg 100 mL/hr over 60 Minutes Intravenous Every 8 hours 04/25/19 0006 04/27/19 1640   04/25/19 0000  vancomycin (VANCOCIN) 1,500 mg in sodium chloride 0.9 % 500 mL IVPB     1,500 mg 250 mL/hr over 120 Minutes Intravenous  Once 04/24/19 2355 04/25/19 0248   04/24/19 2215  ceFEPIme (MAXIPIME) 2 g in sodium chloride 0.9 % 100 mL IVPB  Status:  Discontinued     2 g 200 mL/hr over 30 Minutes Intravenous Every 24 hours 04/24/19 2211 04/24/19 2213   04/24/19 2100  ceFEPIme (MAXIPIME) 2 g in sodium chloride 0.9 % 100 mL IVPB     2 g 200 mL/hr over 30 Minutes Intravenous  Once 04/24/19 2046 04/24/19 2154   04/24/19 2100  metroNIDAZOLE (FLAGYL) IVPB 500 mg     500 mg 100 mL/hr over 60 Minutes Intravenous  Once 04/24/19 2046 04/24/19 2223      Assessment/Plan: A fib, onEliquis- on hold Alzheimer's dementia HTN [id number] cancer of hepatic flexure, sigmoid stricture S/P robotic LOA, right colectomy, sigmoidectomy 11/26/17 CKD  Intra-abdominal abscess - probably secondary to SB perforation S/p exploratory laparotomy/ LOA/ SB resection with anastomosis 10/13 - Tamantha Saline  Awaiting return of bowel function NG to suction Foley out   FEN - NPO/ PICC/ TNA until bowel function returns - will likely have prolonged ileus VTE - Eliquis on hold, last dose 10/8 per chart, Lovenox  today ID - Zosyn for 24 hours post-op Encourage ambulation   LOS: 4 days    Maia Petties 04/28/2019

## 2019-04-28 NOTE — Evaluation (Signed)
Occupational Therapy Evaluation Patient Details Name: Calvin Burnett MRN: [id number] DOB: 1934/03/16 Today's Date: 04/28/2019    History of Present Illness  HY SCHUFF is a 85 y.o. male with PMH: a-fib, alxheimer's dementia, diabetes, GERD, hypertension and prostate cancer who was recently admitted with intra-abdominal abscess due to perforated viscus from April 13, 2009 discharged home on antibiotics. PSH: history of right-sided colectomy and sigmoidoscopy in May 2019. Pt admitted for worsening abd pain. Pt underwent abdominal ex lap on10/13.   Clinical Impression   Pt admitted with the above diagnoses and presents with below problem list. Pt will benefit from continued acute OT to address the below listed deficits and maximize independence with basic ADLs prior to d/c home. PTA pt was independent with basic ADLs. Pt is currently min A with functional transfers and LB ADLs, min guard to min A with bed mobility. Session limited by fatigue.       Follow Up Recommendations  Home health OT;Supervision/Assistance - 24 hour    Equipment Recommendations  3 in 1 bedside commode    Recommendations for Other Services       Precautions / Restrictions Precautions Precautions: Fall Restrictions Weight Bearing Restrictions: No      Mobility Bed Mobility Overal bed mobility: Needs Assistance Bed Mobility: Sit to Supine       Sit to supine: Min guard   General bed mobility comments: HOB elevated. min guard for safety.   Transfers Overall transfer level: Needs assistance Equipment used: Rolling walker (2 wheeled) Transfers: Sit to/from [facility] Sit to Stand: Min assist Stand pivot transfers: Min assist       General transfer comment: Assist to steady, powerup and control descent    Balance Overall balance assessment: Mild deficits observed, not formally tested(utilized rw due to s/p abd surgery and weak)                                          ADL either performed or assessed with clinical judgement   ADL Overall ADL's : Needs assistance/impaired Eating/Feeding: NPO   Grooming: Set up;Sitting   Upper Body Bathing: Sitting;Min guard   Lower Body Bathing: Sit to/from stand;Minimal assistance   Upper Body Dressing : Min guard;Sitting   Lower Body Dressing: Minimal assistance;Sit to/from stand   Toilet Transfer: Minimal assistance;Ambulation;BSC;RW   Toileting- Clothing Manipulation and Hygiene: Minimal assistance;Sit to/from stand   Tub/ Shower Transfer: Minimal assistance     General ADL Comments: Pt completed simulated toilet transfer (recliner to EOB) and bed mobility.      Vision Baseline Vision/History: Wears glasses Wears Glasses: At all times       Perception     Praxis      Pertinent Vitals/Pain Pain Assessment: Faces Faces Pain Scale: Hurts little more Pain Location: abdomen with movement Pain Descriptors / Indicators: Grimacing;Guarding;Sore Pain Intervention(s): Limited activity within patient's tolerance;Monitored during session;Repositioned     Hand Dominance Right   Extremity/Trunk Assessment Upper Extremity Assessment Upper Extremity Assessment: Overall WFL for tasks assessed;Generalized weakness   Lower Extremity Assessment Lower Extremity Assessment: Defer to PT evaluation   Cervical / Trunk Assessment Cervical / Trunk Assessment: Other exceptions Cervical / Trunk Exceptions: large abdominal incision coverd by mesh   Communication Communication Communication: No difficulties   Cognition Arousal/Alertness: Awake/alert Behavior During Therapy: WFL for tasks assessed/performed Overall Cognitive Status: History of cognitive impairments - at  baseline                                 General Comments: pt oriented, following commands and demo's appropriate processing. Per chart pt has baseline alzheimers dementia   General Comments       Exercises      Shoulder Instructions      Home Living Family/patient expects to be discharged to:: Private residence Living Arrangements: Alone Available Help at Discharge: Family;Available PRN/intermittently(however states son and dtr can help 24/7 if needed) Type of Home: House Home Access: Stairs to enter [employer] of Steps: 1 Entrance Stairs-Rails: None Home Layout: One level     Bathroom Shower/Tub: Occupational psychologist: Standard     Home Equipment: Environmental consultant - 2 wheels;Cane - single point   Additional Comments: reports having small shower      Prior Functioning/Environment Level of Independence: Independent        Comments: pt was indep and driving, managing bills, walks 3 miles a day        OT Problem List: Decreased strength;Decreased activity tolerance;Impaired balance (sitting and/or standing);Decreased knowledge of use of DME or AE;Decreased knowledge of precautions;Pain      OT Treatment/Interventions: Self-care/ADL training;Therapeutic exercise;Energy conservation;DME and/or AE instruction;Therapeutic activities;Patient/family education;Balance training    OT Goals(Current goals can be found in the care plan section) Acute Rehab OT Goals Patient Stated Goal: to get better and go home OT Goal Formulation: With patient Time For Goal Achievement: 05/12/19 Potential to Achieve Goals: Good ADL Goals Pt Will Perform Lower Body Bathing: with modified independence;sit to/from stand Pt Will Perform Lower Body Dressing: with modified independence;sit to/from stand Pt Will Transfer to Toilet: with modified independence;ambulating Pt Will Perform Toileting - Clothing Manipulation and hygiene: with modified independence;sit to/from stand Pt Will Perform Tub/Shower Transfer: Shower transfer;with modified independence;ambulating;rolling walker  OT Frequency: Min 2X/week   Barriers to D/C:            Co-evaluation              AM-PAC OT "6 Clicks"  Daily Activity     Outcome Measure Help from another person eating meals?: Total(NPO) Help from another person taking care of personal grooming?: None Help from another person toileting, which includes using toliet, bedpan, or urinal?: A Little Help from another person bathing (including washing, rinsing, drying)?: A Little Help from another person to put on and taking off regular upper body clothing?: A Little Help from another person to put on and taking off regular lower body clothing?: A Little 6 Click Score: 17   End of Session Equipment Utilized During Treatment: Gait belt;Rolling walker  Activity Tolerance: Patient tolerated treatment well;Patient limited by fatigue Patient left: in bed;with call bell/phone within reach;with bed alarm set  OT Visit Diagnosis: Unsteadiness on feet (R26.81);Muscle weakness (generalized) (M62.81);Pain                Time: [id number] OT Time Calculation (min): 22 min Charges:  OT General Charges $OT Visit: 1 Visit OT Evaluation $OT Eval Low Complexity: [place], OT Acute Rehabilitation Services Pager: [phone] Office: [phone]   Hortencia Pilar 04/28/2019, 1:13 PM

## 2019-04-28 NOTE — Progress Notes (Signed)
Appears patient canceled appointment after precharting had begun.

## 2019-04-28 NOTE — Progress Notes (Signed)
Initial Nutrition Assessment  DOCUMENTATION CODES:   Not applicable  INTERVENTION:    Monitor for BM- day 7 without   TPN to meet 100% of needs  Once diet advanced- Boost Breeze po TID, each supplement provides 250 kcal and 9 grams of protein  NUTRITION DIAGNOSIS:   Increased nutrient needs related to post-op healing as evidenced by estimated needs.  GOAL:   Patient will meet greater than or equal to 90% of their needs  MONITOR:   Diet advancement, Labs, Skin, Weight trends, I & O's  REASON FOR ASSESSMENT:   Consult New TPN/TNA  ASSESSMENT:   Patient with PMH significant for DM, GERD, HTN, Alzheimer's dementia, s/p colectomy in May 2019, and prostate cancer. Recently admitted with intra-abdominal abscess due to perforated viscus, discharged home on antibiotics. Presents this admission with worsening abdominal pain related to pneumoperitoneum of unclear etiology.   10/11-s/p drainage LLQ abscess 10/13- s/p ex lap, lysis of adhesions, small bowel resection   Pt denies having a loss of appetite PTA. Typically eats three meals daily that consist of B- [employer] bacon/egg/cheese biscuit L- banana sandwich or something that's light D- [employer] meat, vegetable, and grain. Pt does not use supplementation at home. TPN to be started as pt is at risk for prolonged ileus. Awaiting access. Explained diet progression once bowel function returns. Pt willing to try supplementation.   Pt endorses a UBW of 180 lb and an unintentionally wt loss of 2-3 lbs over the last few weeks. Records indicate pt has maintained his weight over the last year (around 185 lb). Pt is very active (walks three miles a day) and has no issues with mobility.   I/O: +6,005 ml since admit UOP: 1,200 ml x 24 hrs  NGT: 150 ml x 24 hrs   Drips: IV abx Medications: SS novolog Labs: CBG 80-164  NUTRITION - FOCUSED PHYSICAL EXAM:    Most Recent Value  Orbital Region  No depletion  Upper Arm Region  Mild depletion   Thoracic and Lumbar Region  Unable to assess  Buccal Region  No depletion  Temple Region  No depletion  Clavicle Bone Region  No depletion  Clavicle and Acromion Bone Region  No depletion  Scapular Bone Region  Unable to assess  Dorsal Hand  No depletion  Patellar Region  No depletion  Anterior Thigh Region  No depletion  Posterior Calf Region  No depletion  Edema (RD Assessment)  None  Hair  Reviewed  Eyes  Reviewed  Mouth  Reviewed  Skin  Reviewed  Nails  Reviewed       Diet Order:   Diet Order            Diet NPO time specified Except for: Ice Chips  Diet effective midnight              EDUCATION NEEDS:   Education needs have been addressed  Skin:  Skin Assessment: Skin Integrity Issues: Skin Integrity Issues:: Incisions Incisions: abdomen  Last BM:  10/7  Height:   Ht Readings from Last 1 Encounters:  04/25/19 5\' 10"  (1.778 m)    Weight:   Wt Readings from Last 1 Encounters:  04/27/19 84.2 kg    Ideal Body Weight:  75.5 kg  BMI:  Body mass index is 26.65 kg/m.  Estimated Nutritional Needs:   Kcal:  2100-2300 kcal  Protein:  105-120 grams  Fluid:  >/= 2.1 L/day  Mariana Single RD, LDN Clinical Nutrition Pager # - [phone]

## 2019-04-28 NOTE — Progress Notes (Signed)
PHARMACY - ADULT TOTAL PARENTERAL NUTRITION CONSULT NOTE  Pharmacy Consult:  TPN Indication: Prolonged Ileus   Patient Measurements: Height: 5\' 10"  (177.8 cm) Weight: 185 lb 11.2 oz (84.2 kg)(scale c) IBW/kg (Calculated) : 73 TPN AdjBW (KG): 72.1 Body mass index is 26.65 kg/m.  [name] in Sept, 72 kg on admit  Assessment:  15 YOM with history T1N0 adenocarcinoma s/p LoA, right colectomy, sigmoidectomy on 11/26/2017 admitted on 04/24/19 with intra-abdominal abscesses secondary to SB perforation/infection.  He is s/p ex-lap with extensive LoA and SBR with primary anastomosis on 04/27/19. Pharmacy consulted to manage TPN for prolonged ileus.  Although patient has had several admissions with abdominal pain and surgery, he denies losing weight and reduced PO intake.  He eats 2-3 meals a day, at [employer] or [employer].  No risk for refeeding based on patient's report.  GI: hx GERD.  BL pre-albumin 5.8, LBM 10/11, NG O/P 182mL - PPI daily Endo: CBGs < 180 Insulin requirements in the past 24 hours: 2 units Lytes: CL elevated, low CO2, others WNL Renal: SCr 1.34, BUN WNL - UOP 0.6 ml/kg/hr, NS at 75 ml/hr, net +6L Pulm: stable on RA Cards: Afib/HTN - VSS, off Eliquis Hepatobil: LFTs WNL, tbili down to 1.3, TG WNL Neuro: Alzheimer - PRN Fentanyl/morphine ID: Zosyn for IAI/SB perf - afebrile, WBC up to 15.8 TPN Access: PICC ordered 10/13, pending placement TPN start date: 04/28/19  Nutritional Goals (RD rec pending): 2100-2300 kCal, 105-120gm protein per day  Current Nutrition: TPN  Plan:  Initiate TPN at 40 ml/hr (goal rate 80 ml/hr) Electrolytes in TPN: standard, max acetate Daily multivitamin and trace elements in TPN Change sensitive SSI to Q6H F/U AM labs  Corie Vavra D. Mina Marble, PharmD, BCPS, [place] 04/28/2019, 11:37 AM

## 2019-04-28 NOTE — Progress Notes (Signed)
[name] PROGRESS NOTE  Calvin Burnett [id number] DOB: February 11, 1934 DOA: 04/24/2019 PCP: Marin Olp, MD  Brief summary   Calvin Burnett is a 85 y.o. male with medical history significant of diabetes, GERD, hypertension and prostate cancer who was recently admitted with intra-abdominal abscess due to perforated viscus from April 13, 2009 discharged home on antibiotics, history of A. fib, currently on Eliquis.  Hypertension GERD and Alzheimer's dementia, history of right-sided colectomy and sigmoidoscopy in May 2019, pneumoperitoneum who presented again with abdominal pain as well as fever.  Patient had temperature up to 102.  He was on Eliquis and no surgery was done previous admission.  His abscess was deemed not drainable at the time.  He was found to have worsening intra-abdominal abscess.  Patient has not been on antibiotics in the last few days.   Upon arrival to ED, his temperature was 100.5 blood pressure 100/67 pulse 98 respiratory 24 oxygen sat 95% on room air.  Sodium 135 potassium 3.3 chloride 103 CO2 22 glucose 142.  Creatinine 1.27 calcium 7.2.  Albumin 2.0.  White count 17.9 platelet lites 406 and hemoglobin 12.8.  COVID-19 screen is negative.  CT abdomen pelvis shows interval development of multiple intra-abdominal abscesses measure approximately 6.2 x 5.2 x 7.1 cm.  Multiple thick-walled loops of small bowel in the mid abdomen small volume pneumoperitoneum which is decreased from previous small bilateral pleural effusions with bladder wall thickening.    He was admitted under [place].  Surgery was consulted.  He underwent intra-abdominal drain placement by IR on 04/25/2019.  He was continued on broad-spectrum IV antibiotics.  Underwent exploratory laparotomy on 04/27/2019.  Assessment/Plan:  Intra-abdominal abscesses: recurrent infections. Ct abd: Interval development of multiple intra-abdominal abscesses as detailed above measuring up to approximately 6.2 x 5.2 by  7.1 cm. -cont iv antibiotics cefepime, vancomycin and Flagyl. F/u culture which are negative so far.status post intra-abdominal drainage placed by IR on 04/25/2019.  He then underwent exploratory laparotomy on 04/27/2019, attending provider switched to general surgery.  Doing fine today with no abdominal pain.  Remains n.p.o. with NG tube to suction.  Management per primary team/neurosurgery.  Diabetes mellitus type II:  Blood sugar controlled, on low normal side.  Continue SSI.  Dementia: Stable.  He is alert and oriented.  Chronic kidney disease stage III: Also at baseline.  Continue monitoring  Paroxysmal atrial fibrillation: Rate is controlled.  In sinus rhythm.  Hold Eliquis.  Would recommend resuming anticoagulation as soon as possible when he is cleared by general surgery.  Hypertension:   Due to being n.p.o., his metoprolol p.o. has been discontinued.  We will start him on Lopressor 5 mg IV every 8 hours scheduled.  Blood pressure stable.  We will also place him on PRN IV hydralazine as a backup for hypertension.  DVT prophylaxis: Lovenox. Code Status: full Family Communication: Plan of care discussed with patient.  He verbalized understanding.  No questions asked. Disposition Plan: TBD.  Per general surgery.   Consultants:  Surgery  IR  Procedures:  Intra-abdominal drain placement on 04/25/2019  Exploratory laparotomy on 04/27/2019  Antibiotics: Anti-infectives (From admission, onward)   Start     Dose/Rate Route Frequency Ordered Stop   04/27/19 1700  piperacillin-tazobactam (ZOSYN) IVPB 3.375 g     3.375 g 12.5 mL/hr over 240 Minutes Intravenous Every 8 hours 04/27/19 1640     04/25/19 2200  vancomycin (VANCOCIN) 1,250 mg in sodium chloride 0.9 % 250 mL IVPB  Status:  Discontinued     1,250 mg 166.7 mL/hr over 90 Minutes Intravenous Every 24 hours 04/25/19 0158 04/27/19 1640   04/25/19 2100  ceFEPIme (MAXIPIME) 2 g in sodium chloride 0.9 % 100 mL IVPB  Status:   Discontinued     2 g 200 mL/hr over 30 Minutes Intravenous Every 24 hours 04/24/19 2213 04/25/19 0900   04/25/19 1400  ceFEPIme (MAXIPIME) 2 g in sodium chloride 0.9 % 100 mL IVPB  Status:  Discontinued     2 g 200 mL/hr over 30 Minutes Intravenous Every 12 hours 04/25/19 1341 04/27/19 1640   04/25/19 1000  cefTRIAXone (ROCEPHIN) 2 g in sodium chloride 0.9 % 100 mL IVPB  Status:  Discontinued     2 g 200 mL/hr over 30 Minutes Intravenous Every 24 hours 04/25/19 0841 04/25/19 1335   04/25/19 0015  metroNIDAZOLE (FLAGYL) IVPB 500 mg  Status:  Discontinued     500 mg 100 mL/hr over 60 Minutes Intravenous Every 8 hours 04/25/19 0006 04/27/19 1640   04/25/19 0000  vancomycin (VANCOCIN) 1,500 mg in sodium chloride 0.9 % 500 mL IVPB     1,500 mg 250 mL/hr over 120 Minutes Intravenous  Once 04/24/19 2355 04/25/19 0248   04/24/19 2215  ceFEPIme (MAXIPIME) 2 g in sodium chloride 0.9 % 100 mL IVPB  Status:  Discontinued     2 g 200 mL/hr over 30 Minutes Intravenous Every 24 hours 04/24/19 2211 04/24/19 2213   04/24/19 2100  ceFEPIme (MAXIPIME) 2 g in sodium chloride 0.9 % 100 mL IVPB     2 g 200 mL/hr over 30 Minutes Intravenous  Once 04/24/19 2046 04/24/19 2154   04/24/19 2100  metroNIDAZOLE (FLAGYL) IVPB 500 mg     500 mg 100 mL/hr over 60 Minutes Intravenous  Once 04/24/19 2046 04/24/19 2223       (indicate start date, and stop date if known)  HPI/Subjective: Patient seen and examined.  No complaints.  No abdominal pain or shortness of breath.  Objective: Vitals:   04/27/19 2025 04/28/19 0335  BP: 130/79 129/89  Pulse: 88 92  Resp: 16 20  Temp: (!) 97.5 F (36.4 C) (!) 97.5 F (36.4 C)  SpO2: 95% 97%    Intake/Output Summary (Last 24 hours) at 04/28/2019 1212 Last data filed at 04/28/2019 0956 Gross per 24 hour  Intake 600 ml  Output 1400 ml  Net -800 ml   Filed Weights   04/25/19 1507 04/26/19 0406 04/27/19 0500  Weight: 72.1 kg 83.1 kg 84.2 kg    Exam:  General  exam: Appears calm and comfortable  Respiratory system: Clear to auscultation. Respiratory effort normal. Cardiovascular system: S1 & S2 heard, RRR. No JVD, murmurs, rubs, gallops or clicks. No pedal edema. Gastrointestinal system: Abdomen is nondistended, soft with abdominal dressing in place, generalized tenderness no organomegaly or masses felt. Normal bowel sounds heard. Central nervous system: Alert and oriented. No focal neurological deficits. Extremities: Symmetric 5 x 5 power. Skin: No rashes, lesions or ulcers.  Psychiatry: Judgement and insight appear poor. Mood & affect appropriate.    Data Reviewed: Basic Metabolic Panel: Recent Labs  Lab 04/24/19 1848 04/25/19 0308 04/26/19 0655 04/27/19 0443 04/28/19 0434  NA 132* 135 138 139 140  K 3.5 3.3* 3.6 3.5 4.7  CL 101 103 110 110 113*  CO2 20* 22 19* 17* 16*  GLUCOSE 173* 142* 117* 82 154*  BUN 12 15 18 16 21   CREATININE 1.29* 1.27* 1.45* 1.12 1.34*  CALCIUM 8.0*  7.2* 6.7* 7.1* 7.4*  MG  --   --   --   --  1.9  PHOS  --   --   --   --  3.5   Liver Function Tests: Recent Labs  Lab 04/24/19 1848 04/25/19 0308 04/28/19 0434  AST 26 19 33  ALT 25 20 25   ALKPHOS 91 81 90  BILITOT 1.1 1.7* 1.3*  PROT 6.1* 5.3* 5.1*  ALBUMIN 2.4* 2.0* 2.0*   Recent Labs  Lab 04/24/19 1848  LIPASE 42   No results for input(s): AMMONIA in the last 168 hours. CBC: Recent Labs  Lab 04/24/19 1848 04/25/19 0308 04/26/19 0655 04/27/19 0443 04/28/19 0434  WBC 17.9* 17.9* 13.2* 8.1 15.8*  NEUTROABS  --   --   --  5.7 12.9*  HGB 14.1 12.8* 11.0* 11.8* 12.7*  HCT 39.8 35.5* 32.2* 34.3* 37.3*  MCV 92.3 93.2 94.4 93.5 93.7  PLT 458* 406* 356 367 508*   Cardiac Enzymes: No results for input(s): CKTOTAL, CKMB, CKMBINDEX, TROPONINI in the last 168 hours. BNP (last 3 results) No results for input(s): BNP in the last 8760 hours.  ProBNP (last 3 results) Recent Labs    01/29/19 0852  PROBNP 165.0*    CBG: Recent Labs  Lab  04/27/19 1700 04/27/19 1729 04/27/19 2145 04/28/19 0601 04/28/19 1100  GLUCAP 164* 164* 159* 129* 132*    Recent Results (from the past 240 hour(s))  Urine culture     Status: Abnormal   Collection Time: 04/24/19  6:53 PM   Specimen: In/Out Cath Urine  Result Value Ref Range Status   Specimen Description IN/OUT CATH URINE  Final   Special Requests   Final    added 2248 Performed at [facility], [place] [address], [place], [place] [address]    Culture 20,000 COLONIES/mL YEAST (A)  Final   Report Status 04/26/2019 FINAL  Final  Culture, blood (routine x 2)     Status: Abnormal   Collection Time: 04/24/19  9:00 PM   Specimen: BLOOD  Result Value Ref Range Status   Specimen Description BLOOD RIGHT ANTECUBITAL  Final   Special Requests   Final    BOTTLES DRAWN AEROBIC AND ANAEROBIC Blood Culture adequate volume   Culture  Setup Time   Final    GRAM POSITIVE COCCI IN CHAINS IN PAIRS IN BOTH AEROBIC AND ANAEROBIC BOTTLES CRITICAL RESULT CALLED TO, READ BACK BY AND VERIFIED WITH: Salli Real ED:[id number] 04/26/2019 Mena Goes Performed at [facility], [place] [address], [place],  [address]    Culture STREPTOCOCCUS CONSTELLATUS (A)  Final   Report Status 04/28/2019 FINAL  Final   Organism ID, Bacteria STREPTOCOCCUS CONSTELLATUS  Final      Susceptibility   Streptococcus constellatus - MIC*    PENICILLIN INTERMEDIATE Intermediate     CEFTRIAXONE 1 SENSITIVE Sensitive     ERYTHROMYCIN <=0.12 SENSITIVE Sensitive     LEVOFLOXACIN 0.5 SENSITIVE Sensitive     VANCOMYCIN 0.5 SENSITIVE Sensitive     * STREPTOCOCCUS CONSTELLATUS  Blood Culture ID Panel (Reflexed)     Status: Abnormal   Collection Time: 04/24/19  9:00 PM  Result Value Ref Range Status   Enterococcus species NOT DETECTED NOT DETECTED Final   Listeria monocytogenes NOT DETECTED NOT DETECTED Final   Staphylococcus species NOT DETECTED NOT DETECTED Final   Staphylococcus aureus (BCID) NOT DETECTED NOT DETECTED  Final   Streptococcus species DETECTED (A) NOT DETECTED Final    Comment:  Not Enterococcus species, Streptococcus agalactiae, Streptococcus pyogenes, or Streptococcus pneumoniae. CRITICAL RESULT CALLED TO, READ BACK BY AND VERIFIED WITH: G. ABBOTT,PHARMD ED:[id number] 04/26/2019 T. TYSOR    Streptococcus agalactiae NOT DETECTED NOT DETECTED Final   Streptococcus pneumoniae NOT DETECTED NOT DETECTED Final   Streptococcus pyogenes NOT DETECTED NOT DETECTED Final   Acinetobacter baumannii NOT DETECTED NOT DETECTED Final   Enterobacteriaceae species NOT DETECTED NOT DETECTED Final   Enterobacter cloacae complex NOT DETECTED NOT DETECTED Final   Escherichia coli NOT DETECTED NOT DETECTED Final   Klebsiella oxytoca NOT DETECTED NOT DETECTED Final   Klebsiella pneumoniae NOT DETECTED NOT DETECTED Final   Proteus species NOT DETECTED NOT DETECTED Final   Serratia marcescens NOT DETECTED NOT DETECTED Final   Haemophilus influenzae NOT DETECTED NOT DETECTED Final   Neisseria meningitidis NOT DETECTED NOT DETECTED Final   Pseudomonas aeruginosa NOT DETECTED NOT DETECTED Final   Candida albicans NOT DETECTED NOT DETECTED Final   Candida glabrata NOT DETECTED NOT DETECTED Final   Candida krusei NOT DETECTED NOT DETECTED Final   Candida parapsilosis NOT DETECTED NOT DETECTED Final   Candida tropicalis NOT DETECTED NOT DETECTED Final    Comment: Performed at [facility], [place] [address] [place], [place] [address]  Culture, blood (routine x 2)     Status: Abnormal   Collection Time: 04/24/19  9:10 PM   Specimen: BLOOD LEFT FOREARM  Result Value Ref Range Status   Specimen Description BLOOD LEFT FOREARM  Final   Special Requests   Final    BOTTLES DRAWN AEROBIC AND ANAEROBIC Blood Culture adequate volume   Culture  Setup Time   Final    GRAM POSITIVE COCCI AEROBIC BOTTLE ONLY CRITICAL VALUE NOTED.  VALUE IS CONSISTENT WITH PREVIOUSLY REPORTED AND CALLED VALUE.    Culture (A)  Final     STREPTOCOCCUS CONSTELLATUS SUSCEPTIBILITIES PERFORMED ON PREVIOUS CULTURE WITHIN THE LAST 5 DAYS. Performed at [facility], [place] [address], [place], [place] [address]    Report Status 04/28/2019 FINAL  Final  SARS CORONAVIRUS 2 (TAT 6-24 HRS) Nasopharyngeal Nasopharyngeal Swab     Status: None   Collection Time: 04/25/19 12:11 AM   Specimen: Nasopharyngeal Swab  Result Value Ref Range Status   SARS Coronavirus 2 NEGATIVE NEGATIVE Final    Comment: (NOTE) SARS-CoV-2 target nucleic acids are NOT DETECTED. The SARS-CoV-2 RNA is generally detectable in upper and lower respiratory specimens during the acute phase of infection. Negative results do not preclude SARS-CoV-2 infection, do not rule out co-infections with other pathogens, and should not be used as the sole basis for treatment or other patient management decisions. Negative results must be combined with clinical observations, patient history, and epidemiological information. The expected result is Negative. Fact Sheet for Patients: SugarRoll.be Fact Sheet for Healthcare Providers: https://www.woods-mathews.com/ This test is not yet approved or cleared by the [place] FDA and  has been authorized for detection and/or diagnosis of SARS-CoV-2 by FDA under an Emergency Use Authorization (EUA). This EUA will remain  in effect (meaning this test can be used) for the duration of the COVID-19 declaration under Section 56 4(b)(1) of the Act, 21 U.S.C. section 360bbb-3(b)(1), unless the authorization is terminated or revoked sooner. Performed at [facility], [place] [address] [place], [place] [address]   Aerobic/Anaerobic Culture (surgical/deep wound)     Status: Abnormal   Collection Time: 04/25/19  2:40 PM   Specimen: Abscess  Result Value Ref Range Status  Specimen Description ABSCESS  Final   Special Requests Normal  Final   Gram Stain   Final    MODERATE WBC PRESENT,  PREDOMINANTLY PMN ABUNDANT GRAM NEGATIVE RODS MODERATE GRAM POSITIVE COCCI Performed at [facility], [place] [address] [place], [place] [address]    Culture (A)  Final    MULTIPLE ORGANISMS PRESENT, NONE PREDOMINANT MIXED ANAEROBIC FLORA PRESENT.  CALL LAB IF FURTHER IID REQUIRED.    Report Status 04/27/2019 FINAL  Final  Surgical pcr screen     Status: None   Collection Time: 04/27/19  9:06 AM   Specimen: Nasal Mucosa; Nasal Swab  Result Value Ref Range Status   MRSA, PCR NEGATIVE NEGATIVE Final   Staphylococcus aureus NEGATIVE NEGATIVE Final    Comment: (NOTE) The Xpert SA Assay (FDA approved for NASAL specimens in patients 48 years of age and older), is one component of a comprehensive surveillance program. It is not intended to diagnose infection nor to guide or monitor treatment. Performed at [facility], [place] [address] [place], [place] [address]      Studies: [place] Ekg Site Rite  Result Date: 04/27/2019 If [facility] image not attached, placement could not be confirmed due to current cardiac rhythm.   Scheduled Meds: . enoxaparin (LOVENOX) injection  40 mg Subcutaneous Q24H  . insulin aspart  0-9 Units Subcutaneous Q6H  . latanoprost  1 drop Both Eyes QHS  . pantoprazole (PROTONIX) IV  40 mg Intravenous Daily   Continuous Infusions: . sodium chloride 75 mL/hr at 04/27/19 2026  . lactated ringers 10 mL/hr at 04/27/19 1016  . methocarbamol (ROBAXIN) IV    . piperacillin-tazobactam (ZOSYN)  IV 3.375 g (04/28/19 0555)  . TPN ADULT (ION)      Principal Problem:   Intra-abdominal abscess ([name]) Active Problems:   Essential hypertension   GERD (gastroesophageal reflux disease)   Type 2 diabetes mellitus ([name])   Prostate cancer ([name])   Hyperlipidemia associated with type 2 diabetes mellitus ([name])   CKD (chronic kidney disease), stage III ([name])   AF (paroxysmal atrial fibrillation) ([place])   Perforation bowel ([place])   Dementia ([place])    Pneumoperitoneum  Time spent: 27 minutes  Raizy Auzenne  [name] If 7PM-7AM, please contact night-coverage at [email or web address], password [facility] 04/28/2019, 12:12 PM  LOS: 4 days

## 2019-04-28 NOTE — Progress Notes (Signed)
Peripherally Inserted Central Catheter/Midline Placement  The IV Nurse has discussed with the patient and/or persons authorized to consent for the patient, the purpose of this procedure and the potential benefits and risks involved with this procedure.  The benefits include less needle sticks, lab draws from the catheter, and the patient may be discharged home with the catheter. Risks include, but not limited to, infection, bleeding, blood clot (thrombus formation), and puncture of an artery; nerve damage and irregular heartbeat and possibility to perform a PICC exchange if needed/ordered by physician.  Alternatives to this procedure were also discussed.  Bard Power PICC patient education guide, fact sheet on infection prevention and patient information card has been provided to patient /or left at bedside.    PICC/Midline Placement Documentation  PICC Double Lumen [id number] PICC Right Basilic 40 cm 0 cm (Active)  Indication for Insertion or Continuance of Line Administration of hyperosmolar/irritating solutions (i.e. TPN, Vancomycin, etc.) 04/28/19 1745  Exposed Catheter (cm) 0 cm 04/28/19 1745  Site Assessment Clean;Dry;Intact 04/28/19 1745  Lumen #1 Status Flushed;Saline locked;Blood return noted 04/28/19 1745  Lumen #2 Status Flushed;Saline locked;Blood return noted 04/28/19 1745  Dressing Type Transparent 04/28/19 1745  Dressing Status Dry;Clean;Intact;Antimicrobial disc in place 04/28/19 1745  Dressing Change Due 05/05/19 04/28/19 1745       Gordan Payment 04/28/2019, 5:49 PM

## 2019-04-28 NOTE — Care Management Important Message (Signed)
Important Message  Patient Details  Name: Calvin Burnett MRN: [id number] Date of Birth: 04/27/1934   Medicare Important Message Given:  Yes     [name] 04/28/2019, 2:29 PM

## 2019-04-28 NOTE — Evaluation (Signed)
Physical Therapy Evaluation Patient Details Name: Calvin Burnett MRN: [id number] DOB: [birth year] Today's Date: 04/28/2019   History of Present Illness   Calvin Burnett is a 85 y.o. male with PMH: a-fib, alxheimer's dementia, diabetes, GERD, hypertension and prostate cancer who was recently admitted with intra-abdominal abscess due to perforated viscus from April 13, 2009 discharged home on antibiotics. PSH: history of right-sided colectomy and sigmoidoscopy in May 2019. Pt admitted for worsening abd pain. Pt underwent abdominal ex lap on10/13.  Clinical Impression  Pt admitted with above. Pt lives alone but reports son and daughter can provide initial 24/7 assist. Pt whoozy today getting up for first time however c/o minimal pain. Anticipate pt to progress quickly to return home with family and HHPT however if pt requires more assist or is unable to follow ambulation progression, may need to consider ST-SNF to achieve safe mod I level of function. Acute PT to cont to follow.     Follow Up Recommendations Home health PT;Supervision/Assistance - 24 hour    Equipment Recommendations  None recommended by PT    Recommendations for Other Services       Precautions / Restrictions Precautions Precautions: Fall Restrictions Weight Bearing Restrictions: No      Mobility  Bed Mobility Overal bed mobility: Needs Assistance Bed Mobility: Supine to Sit     Supine to sit: Min assist     General bed mobility comments: HOB elevated due to surgery yesterday, increased time, minA for trunk elevation, held folded blanket over abdomen  Transfers Overall transfer level: Needs assistance Equipment used: None Transfers: Sit to/from [facility] Sit to Stand: Min assist         General transfer comment: minA to power up and stedy during transition of hands. slow, unsteady steps in walker to chair, pt reports "I am weaker than I thought"  Ambulation/Gait              General Gait Details: pt reports "i feel whoozy in my stomach" limited to transfer to chair only, BP 108/58 upon transfer to chair  Stairs            Wheelchair Mobility    Modified Rankin (Stroke Patients Only)       Balance Overall balance assessment: Mild deficits observed, not formally tested(needs RW for right now due to surgery)                                           Pertinent Vitals/Pain Pain Assessment: No/denies pain    Home Living Family/patient expects to be discharged to:: Private residence Living Arrangements: Alone Available Help at Discharge: Family;Available PRN/intermittently(however states son and dtr can help 24/7 if needed)   Home Access: Stairs to enter Entrance Stairs-Rails: None Entrance Stairs-Number of Steps: 1 Home Layout: One level Home Equipment: Walker - 2 wheels;Cane - single point Additional Comments: reports having small shower    Prior Function Level of Independence: Independent         Comments: pt was indep and driving, managing bills, walks 3 miles a day     Hand Dominance   Dominant Hand: Right    Extremity/Trunk Assessment        Lower Extremity Assessment Lower Extremity Assessment: Generalized weakness    Cervical / Trunk Assessment Cervical / Trunk Assessment: Other exceptions Cervical / Trunk Exceptions: large abdominal incision coverd by mesh  Communication   Communication: No difficulties  Cognition Arousal/Alertness: Awake/alert Behavior During Therapy: WFL for tasks assessed/performed Overall Cognitive Status: History of cognitive impairments - at baseline                                 General Comments: pt oriented, following commands and demo's appropriate processing. Per chart pt has baseline alzheimers dementia      General Comments General comments (skin integrity, edema, etc.): pt incision covered by mesh dressing    Exercises     Assessment/Plan     PT Assessment Patient needs continued PT services  PT Problem List Decreased strength;Decreased cognition       PT Treatment Interventions Gait training;Stair training;Balance training;Therapeutic exercise;Patient/family education;Therapeutic activities    PT Goals (Current goals can be found in the Care Plan section)  Acute Rehab PT Goals PT Goal Formulation: With patient Time For Goal Achievement: 05/12/19 Potential to Achieve Goals: Good    Frequency Min 3X/week   Barriers to discharge        Co-evaluation               AM-PAC PT "6 Clicks" Mobility  Outcome Measure Help needed turning from your back to your side while in a flat bed without using bedrails?: A Little Help needed moving from lying on your back to sitting on the side of a flat bed without using bedrails?: A Little Help needed moving to and from a bed to a chair (including a wheelchair)?: A Little Help needed standing up from a chair using your arms (e.g., wheelchair or bedside chair)?: A Little Help needed to walk in hospital room?: A Little Help needed climbing 3-5 steps with a railing? : A Little 6 Click Score: 18    End of Session   Activity Tolerance: Patient limited by fatigue Patient left: in chair;with call bell/phone within reach Nurse Communication: Mobility status PT Visit Diagnosis: Muscle weakness (generalized) (M62.81)    Time: [id number] PT Time Calculation (min) (ACUTE ONLY): 27 min   Charges:   PT Evaluation $PT Eval Moderate Complexity: 1 Mod PT Treatments $Therapeutic Activity: 8-22 mins        Kittie Plater, PT, DPT Acute Rehabilitation Services Pager #: [phone] Office #: [phone]   Berline Lopes 04/28/2019, 9:39 AM

## 2019-04-28 NOTE — Patient Instructions (Signed)
Health Maintenance Due  Topic Date Due  . OPHTHALMOLOGY EXAM  04/07/2019   Depression screen [facility] 2/9 03/11/2019 12/31/2018 07/07/2018  Decreased Interest 0 0 0  Down, Depressed, Hopeless 0 0 0  PHQ - 2 Score 0 0 0  Altered sleeping - 0 -  Tired, decreased energy - 0 -  Change in appetite - 0 -  Feeling bad or failure about yourself  - 0 -  Trouble concentrating - 0 -  Moving slowly or fidgety/restless - 0 -  Suicidal thoughts - 0 -  PHQ-9 Score - 0 -  Difficult doing work/chores - Not difficult at all -  Some recent data might be hidden

## 2019-04-29 ENCOUNTER — Inpatient Hospital Stay (HOSPITAL_COMMUNITY): Payer: PPO

## 2019-04-29 DIAGNOSIS — I361 Nonrheumatic tricuspid (valve) insufficiency: Secondary | ICD-10-CM

## 2019-04-29 DIAGNOSIS — I351 Nonrheumatic aortic (valve) insufficiency: Secondary | ICD-10-CM

## 2019-04-29 LAB — CBC WITH DIFFERENTIAL/PLATELET
Abs Immature Granulocytes: 0.41 10*3/uL — ABNORMAL HIGH (ref 0.00–0.07)
Basophils Absolute: 0.1 10*3/uL (ref 0.0–0.1)
Basophils Relative: 0 %
Eosinophils Absolute: 0 10*3/uL (ref 0.0–0.5)
Eosinophils Relative: 0 %
HCT: 34.8 % — ABNORMAL LOW (ref 39.0–52.0)
Hemoglobin: 11.7 g/dL — ABNORMAL LOW (ref 13.0–17.0)
Immature Granulocytes: 2 %
Lymphocytes Relative: 8 %
Lymphs Abs: 1.8 10*3/uL (ref 0.7–4.0)
MCH: 31.7 pg (ref 26.0–34.0)
MCHC: 33.6 g/dL (ref 30.0–36.0)
MCV: 94.3 fL (ref 80.0–100.0)
Monocytes Absolute: 1.2 10*3/uL — ABNORMAL HIGH (ref 0.1–1.0)
Monocytes Relative: 5 %
Neutro Abs: 18.2 10*3/uL — ABNORMAL HIGH (ref 1.7–7.7)
Neutrophils Relative %: 85 %
Platelets: 477 10*3/uL — ABNORMAL HIGH (ref 150–400)
RBC: 3.69 MIL/uL — ABNORMAL LOW (ref 4.22–5.81)
RDW: 14.8 % (ref 11.5–15.5)
WBC: 21.6 10*3/uL — ABNORMAL HIGH (ref 4.0–10.5)
nRBC: 0 % (ref 0.0–0.2)

## 2019-04-29 LAB — GLUCOSE, CAPILLARY
Glucose-Capillary: 237 mg/dL — ABNORMAL HIGH (ref 70–99)
Glucose-Capillary: 253 mg/dL — ABNORMAL HIGH (ref 70–99)
Glucose-Capillary: 238 mg/dL — ABNORMAL HIGH (ref 70–99)
Glucose-Capillary: 161 mg/dL — ABNORMAL HIGH (ref 70–99)

## 2019-04-29 LAB — COMPREHENSIVE METABOLIC PANEL
ALT: 17 U/L (ref 0–44)
AST: 21 U/L (ref 15–41)
Albumin: 1.7 g/dL — ABNORMAL LOW (ref 3.5–5.0)
Alkaline Phosphatase: 68 U/L (ref 38–126)
Anion gap: 9 (ref 5–15)
BUN: 21 mg/dL (ref 8–23)
CO2: 21 mmol/L — ABNORMAL LOW (ref 22–32)
Calcium: 7.3 mg/dL — ABNORMAL LOW (ref 8.9–10.3)
Chloride: 109 mmol/L (ref 98–111)
Creatinine, Ser: 1.26 mg/dL — ABNORMAL HIGH (ref 0.61–1.24)
GFR calc Af Amer: >60 mL/min (ref >60)
GFR calc non Af Amer: 52 mL/min — ABNORMAL LOW (ref >60)
Glucose, Bld: 257 mg/dL — ABNORMAL HIGH (ref 70–99)
Potassium: 3.9 mmol/L (ref 3.5–5.1)
Sodium: 139 mmol/L (ref 135–145)
Total Bilirubin: 0.7 mg/dL (ref 0.3–1.2)
Total Protein: 4.6 g/dL — ABNORMAL LOW (ref 6.5–8.1)

## 2019-04-29 LAB — PHOSPHORUS: Phosphorus: 1.7 mg/dL — ABNORMAL LOW (ref 2.5–4.6)

## 2019-04-29 LAB — MAGNESIUM: Magnesium: 2 mg/dL (ref 1.7–2.4)

## 2019-04-29 LAB — SURGICAL PATHOLOGY

## 2019-04-29 MED ORDER — INSULIN ASPART 100 UNIT/ML ~~LOC~~ SOLN
0.0000 [IU] | SUBCUTANEOUS | Status: DC
  Administered 2019-04-29: 8 [IU] via SUBCUTANEOUS
  Administered 2019-04-29: 18:00:00 5 [IU] via SUBCUTANEOUS
  Administered 2019-04-29 – 2019-04-30 (×3): 3 [IU] via SUBCUTANEOUS
  Administered 2019-04-30: 2 [IU] via SUBCUTANEOUS
  Administered 2019-04-30 – 2019-05-01 (×4): 3 [IU] via SUBCUTANEOUS
  Administered 2019-05-01 – 2019-05-02 (×5): 2 [IU] via SUBCUTANEOUS
  Administered 2019-05-02 – 2019-05-04 (×10): 3 [IU] via SUBCUTANEOUS
  Administered 2019-05-04: 2 [IU] via SUBCUTANEOUS
  Administered 2019-05-04: 21:00:00 3 [IU] via SUBCUTANEOUS
  Administered 2019-05-04: 5 [IU] via SUBCUTANEOUS
  Administered 2019-05-05 (×2): 3 [IU] via SUBCUTANEOUS
  Administered 2019-05-05: 2 [IU] via SUBCUTANEOUS
  Administered 2019-05-05: 3 [IU] via SUBCUTANEOUS
  Administered 2019-05-05: 5 [IU] via SUBCUTANEOUS
  Administered 2019-05-05 – 2019-05-06 (×3): 3 [IU] via SUBCUTANEOUS

## 2019-04-29 MED ORDER — INSULIN GLARGINE 100 UNIT/ML ~~LOC~~ SOLN
10.0000 [IU] | Freq: Every day | SUBCUTANEOUS | Status: DC
  Administered 2019-04-29 – 2019-04-30 (×2): 10 [IU] via SUBCUTANEOUS
  Filled 2019-04-29 (×2): qty 0.1

## 2019-04-29 MED ORDER — SODIUM PHOSPHATES 45 MMOLE/15ML IV SOLN
25.0000 mmol | Freq: Once | INTRAVENOUS | Status: AC
  Administered 2019-04-29: 25 mmol via INTRAVENOUS
  Filled 2019-04-29: qty 8.33

## 2019-04-29 MED ORDER — POTASSIUM PHOSPHATES 15 MMOLE/5ML IV SOLN
40.0000 meq | Freq: Once | INTRAVENOUS | Status: AC
  Administered 2019-04-29: 40 meq via INTRAVENOUS
  Filled 2019-04-29: qty 9.09

## 2019-04-29 MED ORDER — TRAVASOL 10 % IV SOLN
INTRAVENOUS | Status: AC
  Administered 2019-04-29: 18:00:00 via INTRAVENOUS
  Filled 2019-04-29: qty 547.2

## 2019-04-29 MED ORDER — METRONIDAZOLE IN NACL 5-0.79 MG/ML-% IV SOLN
500.0000 mg | Freq: Three times a day (TID) | INTRAVENOUS | Status: DC
  Administered 2019-04-29 – 2019-05-01 (×6): 500 mg via INTRAVENOUS
  Filled 2019-04-29 (×6): qty 100

## 2019-04-29 MED ORDER — SODIUM CHLORIDE 0.9 % IV SOLN
2.0000 g | INTRAVENOUS | Status: DC
  Administered 2019-04-29 – 2019-04-30 (×2): 2 g via INTRAVENOUS
  Filled 2019-04-29: qty 20
  Filled 2019-04-29: qty 2
  Filled 2019-04-29: qty 20

## 2019-04-29 MED ORDER — CHLORHEXIDINE GLUCONATE CLOTH 2 % EX PADS
6.0000 | MEDICATED_PAD | Freq: Every day | CUTANEOUS | Status: DC
  Administered 2019-04-29 – 2019-05-10 (×8): 6 via TOPICAL

## 2019-04-29 NOTE — Progress Notes (Signed)
Results for ANTOWAN, VINYARD (MRN [id number]) as of 04/29/2019 15:08  Ref. Range 04/28/2019 11:00 04/28/2019 16:28 04/28/2019 21:20 04/29/2019 06:11 04/29/2019 12:30  Glucose-Capillary Latest Ref Range: 70 - 99 mg/dL 132 (H) 147 (H) 202 (H) 237 (H) 253 (H)  Noted that CBGs have been greater than 180 mg/dl today.  Recommend adding Novolog 3 units every 4 hours with continuous TPN if blood sugars continue to be elevated.   Harvel Ricks RN BSN CDE Diabetes Coordinator Pager: [phone]  8am-5pm

## 2019-04-29 NOTE — Progress Notes (Signed)
PT Cancellation Note  Patient Details Name: Calvin Burnett MRN: [id number] DOB: 23-Mar-1934   Cancelled Treatment:    Reason Eval/Treat Not Completed: Other (comment).  Pt was asleep and family asked PT to let him sleep.  Will try again at another time.   Ramond Dial 04/29/2019, 4:25 PM   Mee Hives, PT MS Acute Rehab Dept. Number: [place] [facility] [place]

## 2019-04-29 NOTE — Progress Notes (Signed)
[name] PROGRESS NOTE  [name] [id number] DOB: 30-Dec-1933 DOA: 04/24/2019 PCP: Marin Olp, MD  Brief summary   Calvin Burnett is a 85 y.o. male with medical history significant of diabetes, GERD, hypertension and prostate cancer who was recently admitted with intra-abdominal abscess due to perforated viscus from April 13, 2009 discharged home on antibiotics, history of A. fib, currently on Eliquis.  Hypertension GERD and Alzheimer's dementia, history of right-sided colectomy and sigmoidoscopy in May 2019, pneumoperitoneum who presented again with abdominal pain as well as fever.  Patient had temperature up to 102.  He was on Eliquis and no surgery was done previous admission.  His abscess was deemed not drainable at the time.  He was found to have worsening intra-abdominal abscess.  Patient has not been on antibiotics in the last few days.   Upon arrival to ED, his temperature was 100.5 blood pressure 100/67 pulse 98 respiratory 24 oxygen sat 95% on room air.  Sodium 135 potassium 3.3 chloride 103 CO2 22 glucose 142.  Creatinine 1.27 calcium 7.2.  Albumin 2.0.  White count 17.9 platelet lites 406 and hemoglobin 12.8.  COVID-19 screen is negative.  CT abdomen pelvis shows interval development of multiple intra-abdominal abscesses measure approximately 6.2 x 5.2 x 7.1 cm.  Multiple thick-walled loops of small bowel in the mid abdomen small volume pneumoperitoneum which is decreased from previous small bilateral pleural effusions with bladder wall thickening.    He was admitted under [place].  Surgery was consulted.  He underwent intra-abdominal drain placement by IR on 04/25/2019.  He was continued on broad-spectrum IV antibiotics.  Underwent exploratory laparotomy on 04/27/2019.  Assessment/Plan:  Intra-abdominal abscesses: recurrent infections. Ct abd: Interval development of multiple intra-abdominal abscesses as detailed above measuring up to approximately 6.2 x 5.2 by  7.1 cm. -cont iv antibiotics cefepime, vancomycin and Flagyl. F/u culture which are negative so far.status post intra-abdominal drainage placed by IR on 04/25/2019.  He then underwent exploratory laparotomy on 04/27/2019, attending provider switched to general surgery.  Doing fine today with no abdominal pain.  Remains n.p.o. with NG tube to suction.  Management per primary team/neurosurgery.  Diabetes mellitus type II:  Blood sugar elevated in 200s range now that he has been started on TPN.  We will start him Lantus 10 units and continue SSI.  Dementia: Stable.  He is alert and oriented.  Chronic kidney disease stage III: Also at baseline.  Continue monitoring  Paroxysmal atrial fibrillation: Rate is controlled.  In sinus rhythm.  Hold Eliquis.  Would recommend resuming anticoagulation as soon as possible when he is cleared by general surgery.  Hypertension:  Blood pressure very well controlled.  Continue current IV Lopressor on telemetry start taking p.o.  We will switch medications then.  DVT prophylaxis: Lovenox. Code Status: full Family Communication: Plan of care discussed with patient.  He verbalized understanding.  No questions asked. Disposition Plan: TBD.  Per general surgery.   Consultants:  Surgery  IR  Procedures:  Intra-abdominal drain placement on 04/25/2019  Exploratory laparotomy on 04/27/2019  Antibiotics: Anti-infectives (From admission, onward)   Start     Dose/Rate Route Frequency Ordered Stop   04/27/19 1700  piperacillin-tazobactam (ZOSYN) IVPB 3.375 g     3.375 g 12.5 mL/hr over 240 Minutes Intravenous Every 8 hours 04/27/19 1640     04/25/19 2200  vancomycin (VANCOCIN) 1,250 mg in sodium chloride 0.9 % 250 mL IVPB  Status:  Discontinued     1,250 mg 166.7  mL/hr over 90 Minutes Intravenous Every 24 hours 04/25/19 0158 04/27/19 1640   04/25/19 2100  ceFEPIme (MAXIPIME) 2 g in sodium chloride 0.9 % 100 mL IVPB  Status:  Discontinued     2 g 200  mL/hr over 30 Minutes Intravenous Every 24 hours 04/24/19 2213 04/25/19 0900   04/25/19 1400  ceFEPIme (MAXIPIME) 2 g in sodium chloride 0.9 % 100 mL IVPB  Status:  Discontinued     2 g 200 mL/hr over 30 Minutes Intravenous Every 12 hours 04/25/19 1341 04/27/19 1640   04/25/19 1000  cefTRIAXone (ROCEPHIN) 2 g in sodium chloride 0.9 % 100 mL IVPB  Status:  Discontinued     2 g 200 mL/hr over 30 Minutes Intravenous Every 24 hours 04/25/19 0841 04/25/19 1335   04/25/19 0015  metroNIDAZOLE (FLAGYL) IVPB 500 mg  Status:  Discontinued     500 mg 100 mL/hr over 60 Minutes Intravenous Every 8 hours 04/25/19 0006 04/27/19 1640   04/25/19 0000  vancomycin (VANCOCIN) 1,500 mg in sodium chloride 0.9 % 500 mL IVPB     1,500 mg 250 mL/hr over 120 Minutes Intravenous  Once 04/24/19 2355 04/25/19 0248   04/24/19 2215  ceFEPIme (MAXIPIME) 2 g in sodium chloride 0.9 % 100 mL IVPB  Status:  Discontinued     2 g 200 mL/hr over 30 Minutes Intravenous Every 24 hours 04/24/19 2211 04/24/19 2213   04/24/19 2100  ceFEPIme (MAXIPIME) 2 g in sodium chloride 0.9 % 100 mL IVPB     2 g 200 mL/hr over 30 Minutes Intravenous  Once 04/24/19 2046 04/24/19 2154   04/24/19 2100  metroNIDAZOLE (FLAGYL) IVPB 500 mg     500 mg 100 mL/hr over 60 Minutes Intravenous  Once 04/24/19 2046 04/24/19 2223       (indicate start date, and stop date if known)  HPI/Subjective: Patient seen and examined.  He has no complaints.  No abdominal pain.  Objective: Vitals:   04/28/19 1944 04/29/19 0447  BP: 119/78 (!) 143/81  Pulse: 99 99  Resp: 20 20  Temp: 97.6 F (36.4 C) 98.1 F (36.7 C)  SpO2: 96% 97%    Intake/Output Summary (Last 24 hours) at 04/29/2019 1105 Last data filed at 04/29/2019 0835 Gross per 24 hour  Intake 2230.14 ml  Output 1325 ml  Net 905.14 ml   Filed Weights   04/25/19 1507 04/26/19 0406 04/27/19 0500  Weight: 72.1 kg 83.1 kg 84.2 kg    Exam:  General exam: Appears calm and comfortable   Respiratory system: Clear to auscultation. Respiratory effort normal. Cardiovascular system: S1 & S2 heard, RRR. No JVD, murmurs, rubs, gallops or clicks. No pedal edema. Gastrointestinal system: Abdomen is nondistended, soft but tender generalized. No organomegaly or masses felt.  Diminished bowel sounds. Central nervous system: Alert and oriented. No focal neurological deficits. Extremities: Symmetric 5 x 5 power. Skin: No rashes, lesions or ulcers.  Psychiatry: Judgement and insight appear poor, mood & affect appropriate.    Data Reviewed: Basic Metabolic Panel: Recent Labs  Lab 04/25/19 0308 04/26/19 0655 04/27/19 0443 04/28/19 0434 04/29/19 0500  NA 135 138 139 140 139  K 3.3* 3.6 3.5 4.7 3.9  CL 103 110 110 113* 109  CO2 22 19* 17* 16* 21*  GLUCOSE 142* 117* 82 154* 257*  BUN 15 18 16 21 21   CREATININE 1.27* 1.45* 1.12 1.34* 1.26*  CALCIUM 7.2* 6.7* 7.1* 7.4* 7.3*  MG  --   --   --  1.9 2.0  PHOS  --   --   --  3.5 1.7*   Liver Function Tests: Recent Labs  Lab 04/24/19 1848 04/25/19 0308 04/28/19 0434 04/29/19 0500  AST 26 19 33 21  ALT 25 20 25 17   ALKPHOS 91 81 90 68  BILITOT 1.1 1.7* 1.3* 0.7  PROT 6.1* 5.3* 5.1* 4.6*  ALBUMIN 2.4* 2.0* 2.0* 1.7*   Recent Labs  Lab 04/24/19 1848  LIPASE 42   No results for input(s): AMMONIA in the last 168 hours. CBC: Recent Labs  Lab 04/25/19 0308 04/26/19 0655 04/27/19 0443 04/28/19 0434 04/29/19 0500  WBC 17.9* 13.2* 8.1 15.8* 21.6*  NEUTROABS  --   --  5.7 12.9* 18.2*  HGB 12.8* 11.0* 11.8* 12.7* 11.7*  HCT 35.5* 32.2* 34.3* 37.3* 34.8*  MCV 93.2 94.4 93.5 93.7 94.3  PLT 406* 356 367 508* 477*   Cardiac Enzymes: No results for input(s): CKTOTAL, CKMB, CKMBINDEX, TROPONINI in the last 168 hours. BNP (last 3 results) No results for input(s): BNP in the last 8760 hours.  ProBNP (last 3 results) Recent Labs    01/29/19 0852  PROBNP 165.0*    CBG: Recent Labs  Lab 04/28/19 0601 04/28/19 1100  04/28/19 1628 04/28/19 2120 04/29/19 0611  GLUCAP 129* 132* 147* 202* 237*    Recent Results (from the past 240 hour(s))  Urine culture     Status: Abnormal   Collection Time: 04/24/19  6:53 PM   Specimen: In/Out Cath Urine  Result Value Ref Range Status   Specimen Description IN/OUT CATH URINE  Final   Special Requests   Final    added 2248 Performed at [facility], [place] [address] [place], [place] [address]    Culture 20,000 COLONIES/mL YEAST (A)  Final   Report Status 04/26/2019 FINAL  Final  Culture, blood (routine x 2)     Status: Abnormal   Collection Time: 04/24/19  9:00 PM   Specimen: BLOOD  Result Value Ref Range Status   Specimen Description BLOOD RIGHT ANTECUBITAL  Final   Special Requests   Final    BOTTLES DRAWN AEROBIC AND ANAEROBIC Blood Culture adequate volume   Culture  Setup Time   Final    GRAM POSITIVE COCCI IN CHAINS IN PAIRS IN BOTH AEROBIC AND ANAEROBIC BOTTLES CRITICAL RESULT CALLED TO, READ BACK BY AND VERIFIED WITH: Salli Real [id number] 04/26/2019 Mena Goes Performed at [facility], [place] [address] [place],  [address]    Culture STREPTOCOCCUS CONSTELLATUS (A)  Final   Report Status 04/28/2019 FINAL  Final   Organism ID, Bacteria STREPTOCOCCUS CONSTELLATUS  Final      Susceptibility   Streptococcus constellatus - MIC*    PENICILLIN INTERMEDIATE Intermediate     CEFTRIAXONE 1 SENSITIVE Sensitive     ERYTHROMYCIN <=0.12 SENSITIVE Sensitive     LEVOFLOXACIN 0.5 SENSITIVE Sensitive     VANCOMYCIN 0.5 SENSITIVE Sensitive     * STREPTOCOCCUS CONSTELLATUS  Blood Culture ID Panel (Reflexed)     Status: Abnormal   Collection Time: 04/24/19  9:00 PM  Result Value Ref Range Status   Enterococcus species NOT DETECTED NOT DETECTED Final   Listeria monocytogenes NOT DETECTED NOT DETECTED Final   Staphylococcus species NOT DETECTED NOT DETECTED Final   Staphylococcus aureus (BCID) NOT DETECTED NOT DETECTED Final   Streptococcus species  DETECTED (A) NOT DETECTED Final    Comment: Not Enterococcus species, Streptococcus agalactiae, Streptococcus pyogenes, or Streptococcus pneumoniae. CRITICAL RESULT CALLED  TO, READ BACK BY AND VERIFIED WITH: G. ABBOTT,PHARMD 0626 04/26/2019 T. TYSOR    Streptococcus agalactiae NOT DETECTED NOT DETECTED Final   Streptococcus pneumoniae NOT DETECTED NOT DETECTED Final   Streptococcus pyogenes NOT DETECTED NOT DETECTED Final   Acinetobacter baumannii NOT DETECTED NOT DETECTED Final   Enterobacteriaceae species NOT DETECTED NOT DETECTED Final   Enterobacter cloacae complex NOT DETECTED NOT DETECTED Final   Escherichia coli NOT DETECTED NOT DETECTED Final   Klebsiella oxytoca NOT DETECTED NOT DETECTED Final   Klebsiella pneumoniae NOT DETECTED NOT DETECTED Final   Proteus species NOT DETECTED NOT DETECTED Final   Serratia marcescens NOT DETECTED NOT DETECTED Final   Haemophilus influenzae NOT DETECTED NOT DETECTED Final   Neisseria meningitidis NOT DETECTED NOT DETECTED Final   Pseudomonas aeruginosa NOT DETECTED NOT DETECTED Final   Candida albicans NOT DETECTED NOT DETECTED Final   Candida glabrata NOT DETECTED NOT DETECTED Final   Candida krusei NOT DETECTED NOT DETECTED Final   Candida parapsilosis NOT DETECTED NOT DETECTED Final   Candida tropicalis NOT DETECTED NOT DETECTED Final    Comment: Performed at [facility], [place] [address], [place], [place] [address]  Culture, blood (routine x 2)     Status: Abnormal   Collection Time: 04/24/19  9:10 PM   Specimen: BLOOD LEFT FOREARM  Result Value Ref Range Status   Specimen Description BLOOD LEFT FOREARM  Final   Special Requests   Final    BOTTLES DRAWN AEROBIC AND ANAEROBIC Blood Culture adequate volume   Culture  Setup Time   Final    GRAM POSITIVE COCCI AEROBIC BOTTLE ONLY CRITICAL VALUE NOTED.  VALUE IS CONSISTENT WITH PREVIOUSLY REPORTED AND CALLED VALUE.    Culture (A)  Final    STREPTOCOCCUS  CONSTELLATUS SUSCEPTIBILITIES PERFORMED ON PREVIOUS CULTURE WITHIN THE LAST 5 DAYS. Performed at [facility], [place] [address] [place], [place] [address]    Report Status 04/28/2019 FINAL  Final  SARS CORONAVIRUS 2 (TAT 6-24 HRS) Nasopharyngeal Nasopharyngeal Swab     Status: None   Collection Time: 04/25/19 12:11 AM   Specimen: Nasopharyngeal Swab  Result Value Ref Range Status   SARS Coronavirus 2 NEGATIVE NEGATIVE Final    Comment: (NOTE) SARS-CoV-2 target nucleic acids are NOT DETECTED. The SARS-CoV-2 RNA is generally detectable in upper and lower respiratory specimens during the acute phase of infection. Negative results do not preclude SARS-CoV-2 infection, do not rule out co-infections with other pathogens, and should not be used as the sole basis for treatment or other patient management decisions. Negative results must be combined with clinical observations, patient history, and epidemiological information. The expected result is Negative. Fact Sheet for Patients: SugarRoll.be Fact Sheet for Healthcare Providers: https://www.woods-mathews.com/ This test is not yet approved or cleared by the [place] FDA and  has been authorized for detection and/or diagnosis of SARS-CoV-2 by FDA under an Emergency Use Authorization (EUA). This EUA will remain  in effect (meaning this test can be used) for the duration of the COVID-19 declaration under Section 56 4(b)(1) of the Act, 21 U.S.C. section 360bbb-3(b)(1), unless the authorization is terminated or revoked sooner. Performed at [facility], [place] [address], [place], [place] [address]   Aerobic/Anaerobic Culture (surgical/deep wound)     Status: Abnormal   Collection Time: 04/25/19  2:40 PM   Specimen: Abscess  Result Value Ref Range Status   Specimen Description ABSCESS  Final   Special Requests Normal  Final  Gram Stain   Final    MODERATE WBC PRESENT, PREDOMINANTLY  PMN ABUNDANT GRAM NEGATIVE RODS MODERATE GRAM POSITIVE COCCI Performed at [facility], [place] [address], [place], [place] [address]    Culture (A)  Final    MULTIPLE ORGANISMS PRESENT, NONE PREDOMINANT MIXED ANAEROBIC FLORA PRESENT.  CALL LAB IF FURTHER IID REQUIRED.    Report Status 04/27/2019 FINAL  Final  Surgical pcr screen     Status: None   Collection Time: 04/27/19  9:06 AM   Specimen: Nasal Mucosa; Nasal Swab  Result Value Ref Range Status   MRSA, PCR NEGATIVE NEGATIVE Final   Staphylococcus aureus NEGATIVE NEGATIVE Final    Comment: (NOTE) The Xpert SA Assay (FDA approved for NASAL specimens in patients 2 years of age and older), is one component of a comprehensive surveillance program. It is not intended to diagnose infection nor to guide or monitor treatment. Performed at [facility], [place] [address] [place], [place] [address]      Studies: Dg Chest Port 1 View  Result Date: 04/28/2019 CLINICAL DATA:  PICC line placement. EXAM: PORTABLE CHEST 1 VIEW COMPARISON:  One-view chest x-ray in/10/20 FINDINGS: A right-sided PICC line is in place. The tip is at the cavoatrial junction. Heart size is upper limits of normal. Atherosclerotic changes are noted at the aortic arch. NG tube courses off the inferior border the film. New lower lobe airspace disease is present bilaterally, right greater than left. Lung volumes are low. IMPRESSION: 1. Satisfactory positioning of right sided PICC line at the cavoatrial junction. 2. New bilateral lower lobe airspace disease, right greater than left. While this may reflect atelectasis, infection is not excluded. 3. Aortic atherosclerosis. Electronically Signed   By: San Morelle M.D.   On: 04/28/2019 18:37   [place] Ekg Site Rite  Result Date: 04/27/2019 If Site Rite image not attached, placement could not be confirmed due to current cardiac rhythm.   Scheduled Meds: . Chlorhexidine Gluconate Cloth  6 each Topical Daily  .  enoxaparin (LOVENOX) injection  40 mg Subcutaneous Q24H  . insulin aspart  0-15 Units Subcutaneous Q4H  . latanoprost  1 drop Both Eyes QHS  . metoprolol tartrate  5 mg Intravenous Q8H  . pantoprazole (PROTONIX) IV  40 mg Intravenous Daily   Continuous Infusions: . sodium chloride 35 mL/hr at 04/29/19 0900  . lactated ringers 10 mL/hr at 04/27/19 1016  . methocarbamol (ROBAXIN) IV    . piperacillin-tazobactam (ZOSYN)  IV 3.375 g (04/29/19 0528)  . potassium PHOSPHATE IVPB (mEq) 40 mEq (04/29/19 1048)  . sodium phosphate  Dextrose 5% IVPB 25 mmol (04/29/19 1033)  . TPN ADULT (ION) 40 mL/hr at 04/28/19 1901  . TPN ADULT (ION)      Principal Problem:   Intra-abdominal abscess ([name]) Active Problems:   Essential hypertension   GERD (gastroesophageal reflux disease)   Type 2 diabetes mellitus ([name])   Prostate cancer ([name])   Hyperlipidemia associated with type 2 diabetes mellitus ([name])   CKD (chronic kidney disease), stage III ([name])   AF (paroxysmal atrial fibrillation) ([place])   Perforation bowel ([place])   Dementia ([place])   Pneumoperitoneum  Time spent: 28 minutes  Doxie Augenstein  [name] If 7PM-7AM, please contact night-coverage at [email or web address], password [facility] 04/29/2019, 11:05 AM  LOS: 5 days

## 2019-04-29 NOTE — Progress Notes (Signed)
PHARMACY - ADULT TOTAL PARENTERAL NUTRITION CONSULT NOTE  Pharmacy Consult:  TPN Indication: Prolonged Ileus   Patient Measurements: Height: 5\' 10"  (177.8 cm) Weight: 185 lb 11.2 oz (84.2 kg)(scale c) IBW/kg (Calculated) : 73 TPN AdjBW (KG): 72.1 Body mass index is 26.65 kg/m.  [name] in Sept, 72 kg on admit  Assessment:  14 YOM with history T1N0 adenocarcinoma s/p LoA, right colectomy, sigmoidectomy on 11/26/2017 admitted on 04/24/19 with intra-abdominal abscesses secondary to SB perforation/infection.  He is s/p ex-lap with extensive LoA and SBR with primary anastomosis on 04/27/19. Pharmacy consulted to manage TPN for prolonged ileus.  Although patient has had several admissions with abdominal pain and surgery, he denies losing weight and reduced PO intake.  He eats 2-3 meals a day, at [employer] or [employer].  No risk for refeeding based on patient's report; however, trending down electrolytes made it possible that he is mildly refeeding.  GI: hx GERD.  BL pre-albumin 5.8, LBM 10/14, NG O/P up to 432mL - PPI daily Endo: DM on metformin PTA - CBGs elevated d/t TPN, 200s Insulin requirements in the past 24 hours: 6 units since TPN started Lytes: K 3.9 (goal >/= 4 for ileus), CO2 improving, Phos 1.7, others WNL Renal: SCr 1.26, BUN WNL - UOP 0.5 ml/kg/hr, NS at 75 ml/hr, net +6.7L Pulm: stable on RA Cards: Afib/HTN - VSS - IV metoprolol, off Eliquis Hepatobil: LFTs / tbili / TG WNL Neuro: Alzheimer - PRN morphine ID: Zosyn (10/13 >> )  for IAI/SB perf - afebrile, WBC up to 21.6 TPN Access: PICC placed 04/28/19 TPN start date: 04/28/19  Nutritional Goals (RD rec 04/28/19): 2100-2300 kCal, 105-120gm protein per day  Current Nutrition: TPN  Plan:  Continue TPN at 40 ml/hr (goal rate 80 ml/hr).  Advance when CBGs and lytes improve. Electrolytes in TPN: increase K / Phos, max acetate  Daily multivitamin and trace elements in TPN Change SSI to moderate Q4H and add 10  units regular insulin in TPN. Reduce NS to 35 ml/hr NaPhos 25 mmol IV x 1 F/U AM labs, CBGs, initiation of enteral diet  Calvin Burnett, PharmD, BCPS, [place] 04/29/2019, 7:58 AM

## 2019-04-29 NOTE — Progress Notes (Signed)
[facility] Progress Note  2 Days Post-Op  Subjective: CC: no complaints Not much abdominal pain. Eating ice chips around NGT. Reports he is passing flatus, BM recorded in the chart.   Objective: Vital signs in last 24 hours: Temp:  [97.6 F (36.4 C)-98.1 F (36.7 C)] 98.1 F (36.7 C) (10/15 0447) Pulse Rate:  [87-99] 99 (10/15 0447) Resp:  [18-25] 20 (10/15 0447) BP: (115-143)/(77-81) 143/81 (10/15 0447) SpO2:  [96 %-98 %] 97 % (10/15 0447) Last BM Date: 04/21/19  Intake/Output from previous day: 10/14 0701 - 10/15 0700 In: 2230.1 [P.O.:100; I.V.:1882.2; IV Piggyback:248] Out: 1475 [Urine:1025; Emesis/NG output:450] Intake/Output this shift: Total I/O In: -  Out: 200 [Urine:200]  PE: Gen:  Alert, NAD, pleasant Card:  Regular rate and rhythm Pulm:  Normal effort, clear to auscultation bilaterally Abd: Soft, non-tender, mod distended, BS hypoactive, honeycomb dressing intact, NGT with bilious drainage Skin: warm and dry, no rashes    Lab Results:  Recent Labs    04/28/19 0434 04/29/19 0500  WBC 15.8* 21.6*  HGB 12.7* 11.7*  HCT 37.3* 34.8*  PLT 508* 477*   BMET Recent Labs    04/28/19 0434 04/29/19 0500  NA 140 139  K 4.7 3.9  CL 113* 109  CO2 16* 21*  GLUCOSE 154* 257*  BUN 21 21  CREATININE 1.34* 1.26*  CALCIUM 7.4* 7.3*   PT/INR No results for input(s): LABPROT, INR in the last 72 hours. CMP     Component Value Date/Time   NA 139 04/29/2019 0500   K 3.9 04/29/2019 0500   CL 109 04/29/2019 0500   CO2 21 (L) 04/29/2019 0500   GLUCOSE 257 (H) 04/29/2019 0500   BUN 21 04/29/2019 0500   CREATININE 1.26 (H) 04/29/2019 0500   CALCIUM 7.3 (L) 04/29/2019 0500   PROT 4.6 (L) 04/29/2019 0500   ALBUMIN 1.7 (L) 04/29/2019 0500   AST 21 04/29/2019 0500   ALT 17 04/29/2019 0500   ALKPHOS 68 04/29/2019 0500   BILITOT 0.7 04/29/2019 0500   GFRNONAA 52 (L) 04/29/2019 0500   GFRAA >60 04/29/2019 0500   Lipase     Component Value  Date/Time   LIPASE 42 04/24/2019 1848       Studies/Results: Dg Chest Port 1 View  Result Date: 04/28/2019 CLINICAL DATA:  PICC line placement. EXAM: PORTABLE CHEST 1 VIEW COMPARISON:  One-view chest x-ray in/10/20 FINDINGS: A right-sided PICC line is in place. The tip is at the cavoatrial junction. Heart size is upper limits of normal. Atherosclerotic changes are noted at the aortic arch. NG tube courses off the inferior border the film. New lower lobe airspace disease is present bilaterally, right greater than left. Lung volumes are low. IMPRESSION: 1. Satisfactory positioning of right sided PICC line at the cavoatrial junction. 2. New bilateral lower lobe airspace disease, right greater than left. While this may reflect atelectasis, infection is not excluded. 3. Aortic atherosclerosis. Electronically Signed   By: San Morelle M.D.   On: 04/28/2019 18:37   [place] Ekg Site Rite  Result Date: 04/27/2019 If Site Rite image not attached, placement could not be confirmed due to current cardiac rhythm.   Anti-infectives: Anti-infectives (From admission, onward)   Start     Dose/Rate Route Frequency Ordered Stop   04/27/19 1700  piperacillin-tazobactam (ZOSYN) IVPB 3.375 g     3.375 g 12.5 mL/hr over 240 Minutes Intravenous Every 8 hours 04/27/19 1640     04/25/19 2200  vancomycin (VANCOCIN) 1,250 mg in  sodium chloride 0.9 % 250 mL IVPB  Status:  Discontinued     1,250 mg 166.7 mL/hr over 90 Minutes Intravenous Every 24 hours 04/25/19 0158 04/27/19 1640   04/25/19 2100  ceFEPIme (MAXIPIME) 2 g in sodium chloride 0.9 % 100 mL IVPB  Status:  Discontinued     2 g 200 mL/hr over 30 Minutes Intravenous Every 24 hours 04/24/19 2213 04/25/19 0900   04/25/19 1400  ceFEPIme (MAXIPIME) 2 g in sodium chloride 0.9 % 100 mL IVPB  Status:  Discontinued     2 g 200 mL/hr over 30 Minutes Intravenous Every 12 hours 04/25/19 1341 04/27/19 1640   04/25/19 1000  cefTRIAXone (ROCEPHIN) 2 g in sodium  chloride 0.9 % 100 mL IVPB  Status:  Discontinued     2 g 200 mL/hr over 30 Minutes Intravenous Every 24 hours 04/25/19 0841 04/25/19 1335   04/25/19 0015  metroNIDAZOLE (FLAGYL) IVPB 500 mg  Status:  Discontinued     500 mg 100 mL/hr over 60 Minutes Intravenous Every 8 hours 04/25/19 0006 04/27/19 1640   04/25/19 0000  vancomycin (VANCOCIN) 1,500 mg in sodium chloride 0.9 % 500 mL IVPB     1,500 mg 250 mL/hr over 120 Minutes Intravenous  Once 04/24/19 2355 04/25/19 0248   04/24/19 2215  ceFEPIme (MAXIPIME) 2 g in sodium chloride 0.9 % 100 mL IVPB  Status:  Discontinued     2 g 200 mL/hr over 30 Minutes Intravenous Every 24 hours 04/24/19 2211 04/24/19 2213   04/24/19 2100  ceFEPIme (MAXIPIME) 2 g in sodium chloride 0.9 % 100 mL IVPB     2 g 200 mL/hr over 30 Minutes Intravenous  Once 04/24/19 2046 04/24/19 2154   04/24/19 2100  metroNIDAZOLE (FLAGYL) IVPB 500 mg     500 mg 100 mL/hr over 60 Minutes Intravenous  Once 04/24/19 2046 04/24/19 2223       Assessment/Plan A fib, onEliquis- on hold Alzheimer's dementia HTN HxT1N0colon cancer of hepatic flexure, sigmoid stricture S/P robotic LOA, right colectomy, sigmoidectomy 11/26/17 CKD  Intra-abdominal abscess - probably secondary to SB perforation S/p exploratory laparotomy/ LOA/ SB resection with anastomosis 10/13 - Tsuei - POD#2 - clamping trials - check abdominal film to look at NGT placement - mobilize  FEN -NPO/ PICC/ TNA until bowel function returns - will likely have prolonged ileus VTE -Eliquis on hold, last dose 10/8 per chart,Lovenox  ID - Zosyn, WBC 20, afebrile ambulation  LOS: 5 days    Brigid Re , [facility] 04/29/2019, 9:37 AM Pager: [phone] Consults: [phone] 7:00 AM - 4:30 PM M, W-F 7:00 AM - 11:30 AM Tues, Sat, Sun

## 2019-04-29 NOTE — Progress Notes (Signed)
  Echocardiogram 2D Echocardiogram has been performed.  Calvin Burnett 04/29/2019, 5:19 PM

## 2019-04-30 LAB — GLUCOSE, CAPILLARY
Glucose-Capillary: 108 mg/dL — ABNORMAL HIGH (ref 70–99)
Glucose-Capillary: 124 mg/dL — ABNORMAL HIGH (ref 70–99)
Glucose-Capillary: 160 mg/dL — ABNORMAL HIGH (ref 70–99)
Glucose-Capillary: 162 mg/dL — ABNORMAL HIGH (ref 70–99)
Glucose-Capillary: 163 mg/dL — ABNORMAL HIGH (ref 70–99)
Glucose-Capillary: 170 mg/dL — ABNORMAL HIGH (ref 70–99)
Glucose-Capillary: 171 mg/dL — ABNORMAL HIGH (ref 70–99)
Glucose-Capillary: 96 mg/dL (ref 70–99)

## 2019-04-30 LAB — BASIC METABOLIC PANEL
Anion gap: 7 (ref 5–15)
BUN: 12 mg/dL (ref 8–23)
CO2: 24 mmol/L (ref 22–32)
Calcium: 7.1 mg/dL — ABNORMAL LOW (ref 8.9–10.3)
Chloride: 107 mmol/L (ref 98–111)
Creatinine, Ser: 0.95 mg/dL (ref 0.61–1.24)
GFR calc Af Amer: >60 mL/min (ref >60)
GFR calc non Af Amer: >60 mL/min (ref >60)
Glucose, Bld: 162 mg/dL — ABNORMAL HIGH (ref 70–99)
Potassium: 3.5 mmol/L (ref 3.5–5.1)
Sodium: 138 mmol/L (ref 135–145)

## 2019-04-30 LAB — CBC WITH DIFFERENTIAL/PLATELET
Abs Immature Granulocytes: 0.75 10*3/uL — ABNORMAL HIGH (ref 0.00–0.07)
Basophils Absolute: 0.1 10*3/uL (ref 0.0–0.1)
Basophils Relative: 1 %
Eosinophils Absolute: 0.1 10*3/uL (ref 0.0–0.5)
Eosinophils Relative: 1 %
HCT: 30.1 % — ABNORMAL LOW (ref 39.0–52.0)
Hemoglobin: 10.6 g/dL — ABNORMAL LOW (ref 13.0–17.0)
Immature Granulocytes: 5 %
Lymphocytes Relative: 12 %
Lymphs Abs: 1.7 10*3/uL (ref 0.7–4.0)
MCH: 33.2 pg (ref 26.0–34.0)
MCHC: 35.2 g/dL (ref 30.0–36.0)
MCV: 94.4 fL (ref 80.0–100.0)
Monocytes Absolute: 0.7 10*3/uL (ref 0.1–1.0)
Monocytes Relative: 5 %
Neutro Abs: 10.6 10*3/uL — ABNORMAL HIGH (ref 1.7–7.7)
Neutrophils Relative %: 76 %
Platelets: 405 10*3/uL — ABNORMAL HIGH (ref 150–400)
RBC: 3.19 MIL/uL — ABNORMAL LOW (ref 4.22–5.81)
RDW: 14.8 % (ref 11.5–15.5)
WBC: 14 10*3/uL — ABNORMAL HIGH (ref 4.0–10.5)
nRBC: 0 % (ref 0.0–0.2)

## 2019-04-30 LAB — MAGNESIUM: Magnesium: 1.7 mg/dL (ref 1.7–2.4)

## 2019-04-30 LAB — PHOSPHORUS: Phosphorus: 1.8 mg/dL — ABNORMAL LOW (ref 2.5–4.6)

## 2019-04-30 MED ORDER — POTASSIUM PHOSPHATES 15 MMOLE/5ML IV SOLN
30.0000 mmol | Freq: Once | INTRAVENOUS | Status: AC
  Administered 2019-04-30: 30 mmol via INTRAVENOUS
  Filled 2019-04-30: qty 10

## 2019-04-30 MED ORDER — POTASSIUM CHLORIDE 10 MEQ/100ML IV SOLN
INTRAVENOUS | Status: AC
  Filled 2019-04-30: qty 100

## 2019-04-30 MED ORDER — BISACODYL 10 MG RE SUPP
10.0000 mg | Freq: Once | RECTAL | Status: AC
  Administered 2019-04-30: 10 mg via RECTAL
  Filled 2019-04-30: qty 1

## 2019-04-30 MED ORDER — TRAVASOL 10 % IV SOLN
INTRAVENOUS | Status: AC
  Administered 2019-04-30: 18:00:00 via INTRAVENOUS
  Filled 2019-04-30: qty 547.2

## 2019-04-30 MED ORDER — MAGNESIUM SULFATE 2 GM/50ML IV SOLN
2.0000 g | Freq: Once | INTRAVENOUS | Status: AC
  Administered 2019-04-30: 2 g via INTRAVENOUS
  Filled 2019-04-30: qty 50

## 2019-04-30 MED ORDER — POTASSIUM CHLORIDE 10 MEQ/50ML IV SOLN
10.0000 meq | INTRAVENOUS | Status: AC
  Administered 2019-04-30 (×2): 10 meq via INTRAVENOUS
  Filled 2019-04-30 (×2): qty 50

## 2019-04-30 NOTE — Progress Notes (Addendum)
[facility] Progress Note  3 Days Post-Op  Subjective: CC: ileus Patient a little more distended this AM and denies flatus. No further BM recorded. Patient denies nausea but does agree that his abdomen feels bloated.   Objective: Vital signs in last 24 hours: Temp:  [97.6 F (36.4 C)-99.8 F (37.7 C)] 98.4 F (36.9 C) (10/16 1139) Pulse Rate:  [88-102] 102 (10/16 1139) Resp:  [17-25] 22 (10/16 1139) BP: (127-150)/(75-94) 127/94 (10/16 1139) SpO2:  [95 %-98 %] 95 % (10/16 1139) Last BM Date: 04/21/19  Intake/Output from previous day: 10/15 0701 - 10/16 0700 In: 1684.6 [I.V.:1384.6; IV Piggyback:300] Out: 1450 [Urine:1400; Emesis/NG output:50] Intake/Output this shift: Total I/O In: -  Out: 525 [Urine:525]  PE: Gen:  Alert, NAD, pleasant Card:  Regular rate and rhythm Pulm:  Normal effort, clear to auscultation bilaterally Abd: Soft, appropriately ttp, distended, BS hypoactive, honeycomb dressing intact, NGT with bilious drainage Skin: warm and dry, no rashes    Lab Results:  Recent Labs    04/29/19 0500 04/30/19 1014  WBC 21.6* 14.0*  HGB 11.7* 10.6*  HCT 34.8* 30.1*  PLT 477* 405*   BMET Recent Labs    04/29/19 0500 04/30/19 0815  NA 139 138  K 3.9 3.5  CL 109 107  CO2 21* 24  GLUCOSE 257* 162*  BUN 21 12  CREATININE 1.26* 0.95  CALCIUM 7.3* 7.1*   PT/INR No results for input(s): LABPROT, INR in the last 72 hours. CMP     Component Value Date/Time   NA 138 04/30/2019 0815   K 3.5 04/30/2019 0815   CL 107 04/30/2019 0815   CO2 24 04/30/2019 0815   GLUCOSE 162 (H) 04/30/2019 0815   BUN 12 04/30/2019 0815   CREATININE 0.95 04/30/2019 0815   CALCIUM 7.1 (L) 04/30/2019 0815   PROT 4.6 (L) 04/29/2019 0500   ALBUMIN 1.7 (L) 04/29/2019 0500   AST 21 04/29/2019 0500   ALT 17 04/29/2019 0500   ALKPHOS 68 04/29/2019 0500   BILITOT 0.7 04/29/2019 0500   GFRNONAA >60 04/30/2019 0815   GFRAA >60 04/30/2019 0815   Lipase      Component Value Date/Time   LIPASE 42 04/24/2019 1848       Studies/Results: Dg Chest Port 1 View  Result Date: 04/28/2019 CLINICAL DATA:  PICC line placement. EXAM: PORTABLE CHEST 1 VIEW COMPARISON:  One-view chest x-ray in/10/20 FINDINGS: A right-sided PICC line is in place. The tip is at the cavoatrial junction. Heart size is upper limits of normal. Atherosclerotic changes are noted at the aortic arch. NG tube courses off the inferior border the film. New lower lobe airspace disease is present bilaterally, right greater than left. Lung volumes are low. IMPRESSION: 1. Satisfactory positioning of right sided PICC line at the cavoatrial junction. 2. New bilateral lower lobe airspace disease, right greater than left. While this may reflect atelectasis, infection is not excluded. 3. Aortic atherosclerosis. Electronically Signed   By: San Morelle M.D.   On: 04/28/2019 18:37   Dg Abd Portable 1v  Result Date: 04/29/2019 CLINICAL DATA:  Nasogastric tube placement. EXAM: PORTABLE ABDOMEN - 1 VIEW COMPARISON:  April 16, 2019. FINDINGS: Distal tip of nasogastric tube is seen in proximal stomach. Status post cholecystectomy. Dilated air-filled colon is noted. Mildly dilated small bowel loops are noted. Midline surgical staples are noted. No abnormal calcifications are noted. IMPRESSION: Distal tip of nasogastric tube seen in proximal stomach. Mildly dilated large and small bowel loops are noted concerning  for ileus or distal small bowel obstruction. Electronically Signed   By: Marijo Conception M.D.   On: 04/29/2019 12:11    Anti-infectives: Anti-infectives (From admission, onward)   Start     Dose/Rate Route Frequency Ordered Stop   04/29/19 1600  cefTRIAXone (ROCEPHIN) 2 g in sodium chloride 0.9 % 100 mL IVPB     2 g 200 mL/hr over 30 Minutes Intravenous Every 24 hours 04/29/19 1557     04/29/19 1600  metroNIDAZOLE (FLAGYL) IVPB 500 mg     500 mg 100 mL/hr over 60 Minutes Intravenous  Every 8 hours 04/29/19 1557     04/27/19 1700  piperacillin-tazobactam (ZOSYN) IVPB 3.375 g  Status:  Discontinued     3.375 g 12.5 mL/hr over 240 Minutes Intravenous Every 8 hours 04/27/19 1640 04/29/19 1557   04/25/19 2200  vancomycin (VANCOCIN) 1,250 mg in sodium chloride 0.9 % 250 mL IVPB  Status:  Discontinued     1,250 mg 166.7 mL/hr over 90 Minutes Intravenous Every 24 hours 04/25/19 0158 04/27/19 1640   04/25/19 2100  ceFEPIme (MAXIPIME) 2 g in sodium chloride 0.9 % 100 mL IVPB  Status:  Discontinued     2 g 200 mL/hr over 30 Minutes Intravenous Every 24 hours 04/24/19 2213 04/25/19 0900   04/25/19 1400  ceFEPIme (MAXIPIME) 2 g in sodium chloride 0.9 % 100 mL IVPB  Status:  Discontinued     2 g 200 mL/hr over 30 Minutes Intravenous Every 12 hours 04/25/19 1341 04/27/19 1640   04/25/19 1000  cefTRIAXone (ROCEPHIN) 2 g in sodium chloride 0.9 % 100 mL IVPB  Status:  Discontinued     2 g 200 mL/hr over 30 Minutes Intravenous Every 24 hours 04/25/19 0841 04/25/19 1335   04/25/19 0015  metroNIDAZOLE (FLAGYL) IVPB 500 mg  Status:  Discontinued     500 mg 100 mL/hr over 60 Minutes Intravenous Every 8 hours 04/25/19 0006 04/27/19 1640   04/25/19 0000  vancomycin (VANCOCIN) 1,500 mg in sodium chloride 0.9 % 500 mL IVPB     1,500 mg 250 mL/hr over 120 Minutes Intravenous  Once 04/24/19 2355 04/25/19 0248   04/24/19 2215  ceFEPIme (MAXIPIME) 2 g in sodium chloride 0.9 % 100 mL IVPB  Status:  Discontinued     2 g 200 mL/hr over 30 Minutes Intravenous Every 24 hours 04/24/19 2211 04/24/19 2213   04/24/19 2100  ceFEPIme (MAXIPIME) 2 g in sodium chloride 0.9 % 100 mL IVPB     2 g 200 mL/hr over 30 Minutes Intravenous  Once 04/24/19 2046 04/24/19 2154   04/24/19 2100  metroNIDAZOLE (FLAGYL) IVPB 500 mg     500 mg 100 mL/hr over 60 Minutes Intravenous  Once 04/24/19 2046 04/24/19 2223       Assessment/Plan A fib, onEliquis- on hold Alzheimer's dementia HTN HxT1N0colon cancer of  hepatic flexure, sigmoid stricture S/P robotic LOA, right colectomy, sigmoidectomy 11/26/17 CKD  Intra-abdominal abscess - probably secondary to SB perforation S/p exploratory laparotomy/ LOA/ SB resection with anastomosis 10/13 - Tsuei - POD#3 - patient may be developing ileus - continue clamping trials - continue TPN - mobilize!!!  FEN -NPO/ PICC/ TNA until bowel function returns - will likely have prolonged ileus VTE -Eliquis on hold, last dose 10/8 per chart,Lovenox  ID - WBC 14, afebrile - stop abx tomorrow   LOS: 6 days    Brigid Re , [facility] 04/30/2019, 11:57 AM Please see Amion for pager number during day  hours 7:00am-4:30pm

## 2019-04-30 NOTE — Progress Notes (Signed)
[name] PROGRESS NOTE  Calvin Burnett [id number] DOB: 09-05-1933 DOA: 04/24/2019 PCP: Marin Olp, MD  Brief summary   Calvin Burnett is a 85 y.o. male with medical history significant of diabetes, GERD, hypertension and prostate cancer who was recently admitted with intra-abdominal abscess due to perforated viscus from April 13, 2009 discharged home on antibiotics, history of A. fib, currently on Eliquis.  Hypertension GERD and Alzheimer's dementia, history of right-sided colectomy and sigmoidoscopy in May 2019, pneumoperitoneum who presented again with abdominal pain as well as fever.  Patient had temperature up to 102.  He was on Eliquis and no surgery was done previous admission.  His abscess was deemed not drainable at the time.  He was found to have worsening intra-abdominal abscess.  Patient has not been on antibiotics in the last few days.   Upon arrival to ED, his temperature was 100.5 blood pressure 100/67 pulse 98 respiratory 24 oxygen sat 95% on room air.  Sodium 135 potassium 3.3 chloride 103 CO2 22 glucose 142.  Creatinine 1.27 calcium 7.2.  Albumin 2.0.  White count 17.9 platelet lites 406 and hemoglobin 12.8.  COVID-19 screen is negative.  CT abdomen pelvis shows interval development of multiple intra-abdominal abscesses measure approximately 6.2 x 5.2 x 7.1 cm.  Multiple thick-walled loops of small bowel in the mid abdomen small volume pneumoperitoneum which is decreased from previous small bilateral pleural effusions with bladder wall thickening.    He was admitted under [facility].  Surgery was consulted.  He underwent intra-abdominal drain placement by IR on 04/25/2019.  He was continued on broad-spectrum IV antibiotics.  Underwent exploratory laparotomy on 04/27/2019.  Patient's initial blood pressure which were drawn on 04/24/2019 turned out to be positive with Streptococcus constellatus on 04/28/2019.  Patient's antibiotics were de-escalated to Rocephin 2 g IV and  Flagyl were continued.  Transthoracic echo obtained on 04/29/2019 did not show any signs of endocarditis.  Assessment/Plan:  Intra-abdominal abscesses/gram-positive bacteremia: recurrent infections. Ct abd: Interval development of multiple intra-abdominal abscesses as detailed above measuring up to approximately 6.2 x 5.2 by 7.1 cm. status post intra-abdominal drainage placed by IR on 04/25/2019.  He then underwent exploratory laparotomy on 04/27/2019, -Blood culture from 04/24/2019+ with Streptococcus constellatus, antibiotics switched to IV Rocephin 2 g daily along with Flagyl which I will continue.  Will order repeat blood culture.  Transthoracic echo negative for any signs of endocarditis.  I consulted ID and spoke to Dr. Graylon Good personally.  She recommended proceeding with TEE.  I called cardiology [facility] and spoke to Buhl who reassured her that patient will be on list for TEE.    Diabetes mellitus type II:  Blood sugar control.  Per dietitian, they are managing patient's electrolytes and Lantus via TPN.  Dementia: Stable.  He is alert and oriented.  Chronic kidney disease stage III: Also at baseline.  Continue monitoring  Paroxysmal atrial fibrillation: Rate is controlled.  In sinus rhythm.  Hold Eliquis.  Would recommend resuming anticoagulation as soon as possible when he is cleared by general surgery.  Hypertension:  Blood pressure very well controlled.  Continue current IV Lopressor on telemetry start taking p.o.  We will switch medications then.  DVT prophylaxis: Lovenox. Code Status: full Family Communication: Plan of care discussed with patient.  He verbalized understanding.  No questions asked. Disposition Plan: TBD. Per general surgery.  Consultants:  Surgery  IR  Procedures:  Intra-abdominal drain placement on 04/25/2019  Exploratory laparotomy on 04/27/2019  Antibiotics: Anti-infectives (From  admission, onward)   Start     Dose/Rate Route Frequency Ordered Stop    04/29/19 1600  cefTRIAXone (ROCEPHIN) 2 g in sodium chloride 0.9 % 100 mL IVPB     2 g 200 mL/hr over 30 Minutes Intravenous Every 24 hours 04/29/19 1557     04/29/19 1600  metroNIDAZOLE (FLAGYL) IVPB 500 mg     500 mg 100 mL/hr over 60 Minutes Intravenous Every 8 hours 04/29/19 1557     04/27/19 1700  piperacillin-tazobactam (ZOSYN) IVPB 3.375 g  Status:  Discontinued     3.375 g 12.5 mL/hr over 240 Minutes Intravenous Every 8 hours 04/27/19 1640 04/29/19 1557   04/25/19 2200  vancomycin (VANCOCIN) 1,250 mg in sodium chloride 0.9 % 250 mL IVPB  Status:  Discontinued     1,250 mg 166.7 mL/hr over 90 Minutes Intravenous Every 24 hours 04/25/19 0158 04/27/19 1640   04/25/19 2100  ceFEPIme (MAXIPIME) 2 g in sodium chloride 0.9 % 100 mL IVPB  Status:  Discontinued     2 g 200 mL/hr over 30 Minutes Intravenous Every 24 hours 04/24/19 2213 04/25/19 0900   04/25/19 1400  ceFEPIme (MAXIPIME) 2 g in sodium chloride 0.9 % 100 mL IVPB  Status:  Discontinued     2 g 200 mL/hr over 30 Minutes Intravenous Every 12 hours 04/25/19 1341 04/27/19 1640   04/25/19 1000  cefTRIAXone (ROCEPHIN) 2 g in sodium chloride 0.9 % 100 mL IVPB  Status:  Discontinued     2 g 200 mL/hr over 30 Minutes Intravenous Every 24 hours 04/25/19 0841 04/25/19 1335   04/25/19 0015  metroNIDAZOLE (FLAGYL) IVPB 500 mg  Status:  Discontinued     500 mg 100 mL/hr over 60 Minutes Intravenous Every 8 hours 04/25/19 0006 04/27/19 1640   04/25/19 0000  vancomycin (VANCOCIN) 1,500 mg in sodium chloride 0.9 % 500 mL IVPB     1,500 mg 250 mL/hr over 120 Minutes Intravenous  Once 04/24/19 2355 04/25/19 0248   04/24/19 2215  ceFEPIme (MAXIPIME) 2 g in sodium chloride 0.9 % 100 mL IVPB  Status:  Discontinued     2 g 200 mL/hr over 30 Minutes Intravenous Every 24 hours 04/24/19 2211 04/24/19 2213   04/24/19 2100  ceFEPIme (MAXIPIME) 2 g in sodium chloride 0.9 % 100 mL IVPB     2 g 200 mL/hr over 30 Minutes Intravenous  Once 04/24/19 2046  04/24/19 2154   04/24/19 2100  metroNIDAZOLE (FLAGYL) IVPB 500 mg     500 mg 100 mL/hr over 60 Minutes Intravenous  Once 04/24/19 2046 04/24/19 2223       (indicate start date, and stop date if known)  HPI/Subjective: Patient seen and examined.  Continues to have no complaints.  Objective: Vitals:   04/30/19 0325 04/30/19 0827  BP: (!) 150/75 (!) 142/93  Pulse: 98 88  Resp: 17 (!) 21  Temp: 98.3 F (36.8 C) 97.6 F (36.4 C)  SpO2: 96% 97%    Intake/Output Summary (Last 24 hours) at 04/30/2019 1013 Last data filed at 04/30/2019 0924 Gross per 24 hour  Intake 1684.59 ml  Output 1775 ml  Net -90.41 ml   Filed Weights   04/25/19 1507 04/26/19 0406 04/27/19 0500  Weight: 72.1 kg 83.1 kg 84.2 kg    Exam:  General exam: Appears calm and comfortable  Respiratory system: Clear to auscultation. Respiratory effort normal. Cardiovascular system: S1 & S2 heard, RRR. No JVD, murmurs, rubs, gallops or clicks. No pedal edema. Gastrointestinal  system: Abdomen is slightly distended, soft and generalized tender.. No organomegaly or masses felt. Normal bowel sounds heard. Central nervous system: Alert and oriented. No focal neurological deficits. Extremities: Symmetric 5 x 5 power. Skin: No rashes, lesions or ulcers.  Psychiatry: Judgement and insight appear poor.  Mood & affect appropriate.   Data Reviewed: Basic Metabolic Panel: Recent Labs  Lab 04/26/19 0655 04/27/19 0443 04/28/19 0434 04/29/19 0500 04/30/19 0815  NA 138 139 140 139 138  K 3.6 3.5 4.7 3.9 3.5  CL 110 110 113* 109 107  CO2 19* 17* 16* 21* 24  GLUCOSE 117* 82 154* 257* 162*  BUN 18 16 21 21 12   CREATININE 1.45* 1.12 1.34* 1.26* 0.95  CALCIUM 6.7* 7.1* 7.4* 7.3* 7.1*  MG  --   --  1.9 2.0 1.7  PHOS  --   --  3.5 1.7* 1.8*   Liver Function Tests: Recent Labs  Lab 04/24/19 1848 04/25/19 0308 04/28/19 0434 04/29/19 0500  AST 26 19 33 21  ALT 25 20 25 17   ALKPHOS 91 81 90 68  BILITOT 1.1 1.7* 1.3*  0.7  PROT 6.1* 5.3* 5.1* 4.6*  ALBUMIN 2.4* 2.0* 2.0* 1.7*   Recent Labs  Lab 04/24/19 1848  LIPASE 42   No results for input(s): AMMONIA in the last 168 hours. CBC: Recent Labs  Lab 04/25/19 0308 04/26/19 0655 04/27/19 0443 04/28/19 0434 04/29/19 0500  WBC 17.9* 13.2* 8.1 15.8* 21.6*  NEUTROABS  --   --  5.7 12.9* 18.2*  HGB 12.8* 11.0* 11.8* 12.7* 11.7*  HCT 35.5* 32.2* 34.3* 37.3* 34.8*  MCV 93.2 94.4 93.5 93.7 94.3  PLT 406* 356 367 508* 477*   Cardiac Enzymes: No results for input(s): CKTOTAL, CKMB, CKMBINDEX, TROPONINI in the last 168 hours. BNP (last 3 results) No results for input(s): BNP in the last 8760 hours.  ProBNP (last 3 results) Recent Labs    01/29/19 0852  PROBNP 165.0*    CBG: Recent Labs  Lab 04/29/19 1621 04/29/19 2031 04/30/19 0023 04/30/19 0328 04/30/19 0746  GLUCAP 238* 161* 124* 108* 170*    Recent Results (from the past 240 hour(s))  Urine culture     Status: Abnormal   Collection Time: 04/24/19  6:53 PM   Specimen: In/Out Cath Urine  Result Value Ref Range Status   Specimen Description IN/OUT CATH URINE  Final   Special Requests   Final    added 2248 Performed at [facility], [place] [address], [place], [place] [address]    Culture 20,000 COLONIES/mL YEAST (A)  Final   Report Status 04/26/2019 FINAL  Final  Culture, blood (routine x 2)     Status: Abnormal   Collection Time: 04/24/19  9:00 PM   Specimen: BLOOD  Result Value Ref Range Status   Specimen Description BLOOD RIGHT ANTECUBITAL  Final   Special Requests   Final    BOTTLES DRAWN AEROBIC AND ANAEROBIC Blood Culture adequate volume   Culture  Setup Time   Final    GRAM POSITIVE COCCI IN CHAINS IN PAIRS IN BOTH AEROBIC AND ANAEROBIC BOTTLES CRITICAL RESULT CALLED TO, READ BACK BY AND VERIFIED WITH: Salli Real ED:[id number] 04/26/2019 Mena Goes Performed at [facility], [place] [address] [place],  [address]    Culture STREPTOCOCCUS CONSTELLATUS (A)   Final   Report Status 04/28/2019 FINAL  Final   Organism ID, Bacteria STREPTOCOCCUS CONSTELLATUS  Final      Susceptibility   Streptococcus constellatus -  MIC*    PENICILLIN INTERMEDIATE Intermediate     CEFTRIAXONE 1 SENSITIVE Sensitive     ERYTHROMYCIN <=0.12 SENSITIVE Sensitive     LEVOFLOXACIN 0.5 SENSITIVE Sensitive     VANCOMYCIN 0.5 SENSITIVE Sensitive     * STREPTOCOCCUS CONSTELLATUS  Blood Culture ID Panel (Reflexed)     Status: Abnormal   Collection Time: 04/24/19  9:00 PM  Result Value Ref Range Status   Enterococcus species NOT DETECTED NOT DETECTED Final   Listeria monocytogenes NOT DETECTED NOT DETECTED Final   Staphylococcus species NOT DETECTED NOT DETECTED Final   Staphylococcus aureus (BCID) NOT DETECTED NOT DETECTED Final   Streptococcus species DETECTED (A) NOT DETECTED Final    Comment: Not Enterococcus species, Streptococcus agalactiae, Streptococcus pyogenes, or Streptococcus pneumoniae. CRITICAL RESULT CALLED TO, READ BACK BY AND VERIFIED WITH: G. ABBOTT,PHARMD ED:[id number] 04/26/2019 T. TYSOR    Streptococcus agalactiae NOT DETECTED NOT DETECTED Final   Streptococcus pneumoniae NOT DETECTED NOT DETECTED Final   Streptococcus pyogenes NOT DETECTED NOT DETECTED Final   Acinetobacter baumannii NOT DETECTED NOT DETECTED Final   Enterobacteriaceae species NOT DETECTED NOT DETECTED Final   Enterobacter cloacae complex NOT DETECTED NOT DETECTED Final   Escherichia coli NOT DETECTED NOT DETECTED Final   Klebsiella oxytoca NOT DETECTED NOT DETECTED Final   Klebsiella pneumoniae NOT DETECTED NOT DETECTED Final   Proteus species NOT DETECTED NOT DETECTED Final   Serratia marcescens NOT DETECTED NOT DETECTED Final   Haemophilus influenzae NOT DETECTED NOT DETECTED Final   Neisseria meningitidis NOT DETECTED NOT DETECTED Final   Pseudomonas aeruginosa NOT DETECTED NOT DETECTED Final   Candida albicans NOT DETECTED NOT DETECTED Final   Candida glabrata NOT DETECTED NOT  DETECTED Final   Candida krusei NOT DETECTED NOT DETECTED Final   Candida parapsilosis NOT DETECTED NOT DETECTED Final   Candida tropicalis NOT DETECTED NOT DETECTED Final    Comment: Performed at [facility], [place] [address], [place], [place] [address]  Culture, blood (routine x 2)     Status: Abnormal   Collection Time: 04/24/19  9:10 PM   Specimen: BLOOD LEFT FOREARM  Result Value Ref Range Status   Specimen Description BLOOD LEFT FOREARM  Final   Special Requests   Final    BOTTLES DRAWN AEROBIC AND ANAEROBIC Blood Culture adequate volume   Culture  Setup Time   Final    GRAM POSITIVE COCCI AEROBIC BOTTLE ONLY CRITICAL VALUE NOTED.  VALUE IS CONSISTENT WITH PREVIOUSLY REPORTED AND CALLED VALUE.    Culture (A)  Final    STREPTOCOCCUS CONSTELLATUS SUSCEPTIBILITIES PERFORMED ON PREVIOUS CULTURE WITHIN THE LAST 5 DAYS. Performed at [facility], [place] [address], [place], [place] [address]    Report Status 04/28/2019 FINAL  Final  SARS CORONAVIRUS 2 (TAT 6-24 HRS) Nasopharyngeal Nasopharyngeal Swab     Status: None   Collection Time: 04/25/19 12:11 AM   Specimen: Nasopharyngeal Swab  Result Value Ref Range Status   SARS Coronavirus 2 NEGATIVE NEGATIVE Final    Comment: (NOTE) SARS-CoV-2 target nucleic acids are NOT DETECTED. The SARS-CoV-2 RNA is generally detectable in upper and lower respiratory specimens during the acute phase of infection. Negative results do not preclude SARS-CoV-2 infection, do not rule out co-infections with other pathogens, and should not be used as the sole basis for treatment or other patient management decisions. Negative results must be combined with clinical observations, patient history, and epidemiological information. The expected result is Negative. Fact Sheet for Patients: SugarRoll.be  Fact Sheet for Healthcare Providers: https://www.woods-mathews.com/ This test is not yet approved or  cleared by the [place] FDA and  has been authorized for detection and/or diagnosis of SARS-CoV-2 by FDA under an Emergency Use Authorization (EUA). This EUA will remain  in effect (meaning this test can be used) for the duration of the COVID-19 declaration under Section 56 4(b)(1) of the Act, 21 U.S.C. section 360bbb-3(b)(1), unless the authorization is terminated or revoked sooner. Performed at [facility], [place] [address], [place], [place] [address]   Aerobic/Anaerobic Culture (surgical/deep wound)     Status: Abnormal   Collection Time: 04/25/19  2:40 PM   Specimen: Abscess  Result Value Ref Range Status   Specimen Description ABSCESS  Final   Special Requests Normal  Final   Gram Stain   Final    MODERATE WBC PRESENT, PREDOMINANTLY PMN ABUNDANT GRAM NEGATIVE RODS MODERATE GRAM POSITIVE COCCI Performed at [facility], [address] [place], [place] [address]    Culture (A)  Final    MULTIPLE ORGANISMS PRESENT, NONE PREDOMINANT MIXED ANAEROBIC FLORA PRESENT.  CALL LAB IF FURTHER IID REQUIRED.    Report Status 04/27/2019 FINAL  Final  Surgical pcr screen     Status: None   Collection Time: 04/27/19  9:06 AM   Specimen: Nasal Mucosa; Nasal Swab  Result Value Ref Range Status   MRSA, PCR NEGATIVE NEGATIVE Final   Staphylococcus aureus NEGATIVE NEGATIVE Final    Comment: (NOTE) The Xpert SA Assay (FDA approved for NASAL specimens in patients 7 years of age and older), is one component of a comprehensive surveillance program. It is not intended to diagnose infection nor to guide or monitor treatment. Performed at [facility], [place] [address], [place], [place] [address]      Studies: Dg Chest Port 1 View  Result Date: 04/28/2019 CLINICAL DATA:  PICC line placement. EXAM: PORTABLE CHEST 1 VIEW COMPARISON:  One-view chest x-ray in/10/20 FINDINGS: A right-sided PICC line is in place. The tip is at the cavoatrial junction. Heart size is upper limits  of normal. Atherosclerotic changes are noted at the aortic arch. NG tube courses off the inferior border the film. New lower lobe airspace disease is present bilaterally, right greater than left. Lung volumes are low. IMPRESSION: 1. Satisfactory positioning of right sided PICC line at the cavoatrial junction. 2. New bilateral lower lobe airspace disease, right greater than left. While this may reflect atelectasis, infection is not excluded. 3. Aortic atherosclerosis. Electronically Signed   By: San Morelle M.D.   On: 04/28/2019 18:37   Dg Abd Portable 1v  Result Date: 04/29/2019 CLINICAL DATA:  Nasogastric tube placement. EXAM: PORTABLE ABDOMEN - 1 VIEW COMPARISON:  April 16, 2019. FINDINGS: Distal tip of nasogastric tube is seen in proximal stomach. Status post cholecystectomy. Dilated air-filled colon is noted. Mildly dilated small bowel loops are noted. Midline surgical staples are noted. No abnormal calcifications are noted. IMPRESSION: Distal tip of nasogastric tube seen in proximal stomach. Mildly dilated large and small bowel loops are noted concerning for ileus or distal small bowel obstruction. Electronically Signed   By: Marijo Conception M.D.   On: 04/29/2019 12:11    Scheduled Meds: . Chlorhexidine Gluconate Cloth  6 each Topical Daily  . enoxaparin (LOVENOX) injection  40 mg Subcutaneous Q24H  . insulin aspart  0-15 Units Subcutaneous Q4H  . latanoprost  1 drop Both Eyes QHS  . metoprolol tartrate  5 mg Intravenous  Q8H  . pantoprazole (PROTONIX) IV  40 mg Intravenous Daily   Continuous Infusions: . sodium chloride 35 mL/hr at 04/30/19 0400  . cefTRIAXone (ROCEPHIN)  IV Stopped (04/29/19 1814)  . lactated ringers 10 mL/hr at 04/27/19 1016  . magnesium sulfate bolus IVPB    . methocarbamol (ROBAXIN) IV    . metronidazole 500 mg (04/30/19 0931)  . potassium chloride    . potassium PHOSPHATE IVPB (in mmol)    . TPN ADULT (ION) 40 mL/hr at 04/30/19 0400  . TPN ADULT (ION)       Principal Problem:   Intra-abdominal abscess ([name]) Active Problems:   Essential hypertension   GERD (gastroesophageal reflux disease)   Type 2 diabetes mellitus ([name])   Prostate cancer ([name])   Hyperlipidemia associated with type 2 diabetes mellitus ([name])   CKD (chronic kidney disease), stage III ([name])   AF (paroxysmal atrial fibrillation) ([place])   Perforation bowel ([place])   Dementia ([place])   Pneumoperitoneum  Time spent: 31 minutes  Mishaal Lansdale  [name] If 7PM-7AM, please contact night-coverage at [email or web address], password [facility] 04/30/2019, 10:13 AM  LOS: 6 days

## 2019-04-30 NOTE — Progress Notes (Signed)
PHARMACY - ADULT TOTAL PARENTERAL NUTRITION CONSULT NOTE  Pharmacy Consult:  TPN Indication: Prolonged Ileus   Patient Measurements: Height: 5\' 10"  (177.8 cm) Weight: 185 lb 11.2 oz (84.2 kg)(scale c) IBW/kg (Calculated) : 73 TPN AdjBW (KG): 72.1 Body mass index is 26.65 kg/m.  [name] in Sept, 72 kg on admit  Assessment:  36 YOM with history T1N0 adenocarcinoma s/p LoA, right colectomy, sigmoidectomy on 11/26/2017 admitted on 04/24/19 with intra-abdominal abscesses secondary to SB perforation/infection.  He is s/p ex-lap with extensive LoA and SBR with primary anastomosis on 04/27/19. Pharmacy consulted to manage TPN for prolonged ileus.  Although patient has had several admissions with abdominal pain and surgery, he denies losing weight and reduced PO intake.  He eats 2-3 meals a day, at [employer] and [employer].  No risk for refeeding based on patient's report; however, trending down electrolytes made it possible that he is refeeding.  GI: hx GERD.  BL pre-albumin 5.8, LBM 10/14, NG O/P up to 474mL - PPI daily Endo: DM on metformin PTA - CBGs improving Insulin requirements in the past 24 hours: 19 units SSI + 10 units in TPN + Lantus 10/d Lytes: K 3.5 (goal >/= 4 for ileus), Phos 1.8 (post NaPhos 25 mmol + KPhos 30 mmol), Mag 1.7 (goal >/= 2 for ileus), others WNL Renal: SCr 1.26, BUN WNL - UOP 0.5 ml/kg/hr, NS at 35 ml/hr, net +6.7L Pulm: stable on RA Cards: Afib/HTN - intermittent high BP/HR - IV metoprolol, off Eliquis Hepatobil: LFTs / tbili / TG WNL Neuro: Alzheimer - PRN morphine ID: Zosyn (10/13) >> CTX/Flagyl (10/15 >> ) for IAI/SB perf - afebrile, WBC up to 21.6 TPN Access: PICC placed 04/28/19 TPN start date: 04/28/19  Nutritional Goals (RD rec 04/28/19): 2100-2300 kCal, 105-120gm protein per day  Current Nutrition: TPN  Plan:  Continue TPN at 40 ml/hr (goal rate 80 ml/hr).  Advance when lytes improve. Electrolytes in TPN: increase K / Phos / Mag, max  acetate  Daily multivitamin and trace elements in TPN Continue moderate Q4H and 10 units regular insulin in TPN. Discussed with MD, D/C Lantus and utilize insulin in TPN to control blood glucose (10/16's dose is already given - so no adjustment in TPN today) NS at 35 ml/hr KPhos 30 mmol IV x 1 KCL x 2 runs Mag sulfate 2gm IV x 1 F/U AM labs, initiation of enteral diet  Tenelle Andreason D. Mina Marble, PharmD, BCPS, [place] 04/30/2019, 9:51 AM

## 2019-04-30 NOTE — Progress Notes (Signed)
Physical Therapy Treatment Patient Details Name: Calvin Burnett MRN: [id number] DOB: [birth year] Today's Date: 04/30/2019    History of Present Illness Calvin Burnett is a 85 y.o. male with PMH: a-fib, alxheimer's dementia, diabetes, GERD, hypertension and prostate cancer who was recently admitted with intra-abdominal abscess due to perforated viscus from April 13, 2009 discharged home on antibiotics. PSH: history of right-sided colectomy and sigmoidoscopy in May 2019. Pt admitted for worsening abd pain. Pt underwent abdominal ex lap on10/13.  Abd xray 04/29/19: concerning for ileusor distal small bowel obstruction.    PT Comments    Pt able to take a few more steps today but remains limited by fatigue.  Continues to need skilled PT for assist with transfers and cues  Follow Up Recommendations  Home health PT;Supervision/Assistance - 24 hour     Equipment Recommendations  Rolling walker with 5" wheels    Recommendations for Other Services       Precautions / Restrictions Precautions Precautions: Fall    Mobility  Bed Mobility Overal bed mobility: Needs Assistance Bed Mobility: Rolling Rolling: Min guard   Supine to sit: Min assist     General bed mobility comments: HOB elevated.; cues for log roll  Transfers Overall transfer level: Needs assistance Equipment used: Rolling walker (2 wheeled) Transfers: Sit to/from Stand Sit to Stand: Min guard         General transfer comment: cues to push up with hands and bed elevated  Ambulation/Gait Ambulation/Gait assistance: Min guard Gait Distance (Feet): 5 Feet Assistive device: Rolling walker (2 wheeled) Gait Pattern/deviations: Shuffle Gait velocity: decreased   General Gait Details: fatigued easily; cued for RW; only tolerated steps to chair; vss   Stairs             Wheelchair Mobility    Modified Rankin (Stroke Patients Only)       Balance Overall balance assessment: Needs assistance    Sitting balance-Leahy Scale: Good       Standing balance-Leahy Scale: Fair                              Cognition Arousal/Alertness: Awake/alert Behavior During Therapy: WFL for tasks assessed/performed Overall Cognitive Status: History of cognitive impairments - at baseline                                 General Comments: pt oriented, following commands and demo's appropriate processing. Per chart pt has baseline alzheimers dementia      Exercises      General Comments        Pertinent Vitals/Pain Pain Assessment: No/denies pain    Home Living                      Prior Function            PT Goals (current goals can now be found in the care plan section) Progress towards PT goals: Progressing toward goals    Frequency    Min 3X/week      PT Plan Current plan remains appropriate    Co-evaluation              AM-PAC PT "6 Clicks" Mobility   Outcome Measure  Help needed turning from your back to your side while in a flat bed without using bedrails?: A Little Help needed moving from lying on your  back to sitting on the side of a flat bed without using bedrails?: A Little Help needed moving to and from a bed to a chair (including a wheelchair)?: A Little Help needed standing up from a chair using your arms (e.g., wheelchair or bedside chair)?: A Little Help needed to walk in hospital room?: A Little Help needed climbing 3-5 steps with a railing? : A Lot 6 Click Score: 17    End of Session Equipment Utilized During Treatment: Gait belt Activity Tolerance: Patient limited by fatigue Patient left: in chair;with call bell/phone within reach Nurse Communication: Mobility status(whiteboard communication) PT Visit Diagnosis: Muscle weakness (generalized) (M62.81);Difficulty in walking, not elsewhere classified (R26.2)     Time: 1020-1040 PT Time Calculation (min) (ACUTE ONLY): 20 min  Charges:  $Gait Training:  8-22 mins                     Calvin Burnett, PT Acute Rehab Services [phone]    Karlton Lemon 04/30/2019, 11:08 AM

## 2019-04-30 NOTE — Progress Notes (Signed)
Occupational Therapy Treatment Patient Details Name: Calvin Burnett MRN: [id number] DOB: 06-Jul-1934 Today's Date: 04/30/2019    History of present illness DAVYON BROSZ is a 85 y.o. male with PMH: a-fib, alxheimer's dementia, diabetes, GERD, hypertension and prostate cancer who was recently admitted with intra-abdominal abscess due to perforated viscus from April 13, 2009 discharged home on antibiotics. PSH: history of right-sided colectomy and sigmoidoscopy in May 2019. Pt admitted for worsening abd pain. Pt underwent abdominal ex lap on10/13.  Abd xray 04/29/19: concerning for ileusor distal small bowel obstruction.   OT comments  Pt making steady progress towards OT goals this session. Session focus on functional transfers as precursor to higher level ADLs. Pt MIN guard sit>stand from recliner with RW; MIN A for stand pivot transfer to EOB with RW for balance and assist with line management. DC plan remains appropriate. Will continue to follow acutely for OT needs.    Follow Up Recommendations  Home health OT;Supervision/Assistance - 24 hour    Equipment Recommendations  3 in 1 bedside commode    Recommendations for Other Services      Precautions / Restrictions Precautions Precautions: Fall Restrictions Weight Bearing Restrictions: No       Mobility Bed Mobility Overal bed mobility: Needs Assistance Bed Mobility: Sit to Supine Rolling: Min guard   Supine to sit: Min assist Sit to supine: Supervision   General bed mobility comments: HOB elevated; supervision for safety for line management  Transfers Overall transfer level: Needs assistance Equipment used: Rolling walker (2 wheeled) Transfers: Sit to/from Stand Sit to Stand: Min guard Stand pivot transfers: Min assist       General transfer comment: cues to push up from seated surface rather than pulling on RW    Balance Overall balance assessment: Needs assistance Sitting-balance support: Feet  supported Sitting balance-Leahy Scale: Good     Standing balance support: Bilateral upper extremity supported Standing balance-Leahy Scale: Poor Standing balance comment: relian ton BUE support during transfer                           ADL either performed or assessed with clinical judgement   ADL Overall ADL's : Needs assistance/impaired Eating/Feeding: NPO                       Toilet Transfer: Stand-pivot;RW;Minimal assistance Toilet Transfer Details (indicate cue type and reason): [place] simulated toilet transfer from recline>EOB; MIN A for balance and to manage lines           General ADL Comments: pt completed simulated toilet transfr ( recliner>EOB) and bed mobility     Vision Baseline Vision/History: Wears glasses Wears Glasses: At all times     Perception     Praxis      Cognition Arousal/Alertness: Awake/alert Behavior During Therapy: WFL for tasks assessed/performed Overall Cognitive Status: History of cognitive impairments - at baseline                                 General Comments: pt oriented, following commands and demo's appropriate processing. Per chart pt has baseline alzheimers dementia        Exercises     Shoulder Instructions       General Comments      Pertinent Vitals/ Pain       Pain Assessment: No/denies pain  Home Living  Prior Functioning/Environment              Frequency  Min 2X/week        Progress Toward Goals  OT Goals(current goals can now be found in the care plan section)  Progress towards OT goals: Progressing toward goals  Acute Rehab OT Goals Patient Stated Goal: to get better and go home OT Goal Formulation: With patient Time For Goal Achievement: 05/12/19 Potential to Achieve Goals: Good  Plan Discharge plan remains appropriate    Co-evaluation                 AM-PAC OT "6 Clicks" Daily  Activity     Outcome Measure   Help from another person eating meals?: Total(NPO) Help from another person taking care of personal grooming?: None Help from another person toileting, which includes using toliet, bedpan, or urinal?: A Little Help from another person bathing (including washing, rinsing, drying)?: A Little Help from another person to put on and taking off regular upper body clothing?: A Little Help from another person to put on and taking off regular lower body clothing?: A Little 6 Click Score: 17    End of Session Equipment Utilized During Treatment: Rolling walker  OT Visit Diagnosis: Unsteadiness on feet (R26.81);Muscle weakness (generalized) (M62.81);Pain   Activity Tolerance Patient tolerated treatment well   Patient Left in bed;with call bell/phone within reach   Nurse Communication Mobility status        Time: [id number] OT Time Calculation (min): 19 min  Charges: OT General Charges $OT Visit: 1 Visit OT Treatments $Therapeutic Activity: 8-22 mins  [place], [place] [phone] [place] 04/30/2019, 1:42 PM

## 2019-05-01 ENCOUNTER — Inpatient Hospital Stay (HOSPITAL_COMMUNITY): Payer: PPO

## 2019-05-01 LAB — CBC WITH DIFFERENTIAL/PLATELET
Abs Immature Granulocytes: 1.04 10*3/uL — ABNORMAL HIGH (ref 0.00–0.07)
Basophils Absolute: 0.1 10*3/uL (ref 0.0–0.1)
Basophils Relative: 1 %
Eosinophils Absolute: 0.2 10*3/uL (ref 0.0–0.5)
Eosinophils Relative: 1 %
HCT: 30.6 % — ABNORMAL LOW (ref 39.0–52.0)
Hemoglobin: 10.4 g/dL — ABNORMAL LOW (ref 13.0–17.0)
Immature Granulocytes: 8 %
Lymphocytes Relative: 16 %
Lymphs Abs: 2.1 10*3/uL (ref 0.7–4.0)
MCH: 31.9 pg (ref 26.0–34.0)
MCHC: 34 g/dL (ref 30.0–36.0)
MCV: 93.9 fL (ref 80.0–100.0)
Monocytes Absolute: 0.9 10*3/uL (ref 0.1–1.0)
Monocytes Relative: 7 %
Neutro Abs: 9.2 10*3/uL — ABNORMAL HIGH (ref 1.7–7.7)
Neutrophils Relative %: 67 %
Platelets: 401 10*3/uL — ABNORMAL HIGH (ref 150–400)
RBC: 3.26 MIL/uL — ABNORMAL LOW (ref 4.22–5.81)
RDW: 14.7 % (ref 11.5–15.5)
WBC: 13.5 10*3/uL — ABNORMAL HIGH (ref 4.0–10.5)
nRBC: 0 % (ref 0.0–0.2)

## 2019-05-01 LAB — BASIC METABOLIC PANEL
Anion gap: 5 (ref 5–15)
BUN: 13 mg/dL (ref 8–23)
CO2: 25 mmol/L (ref 22–32)
Calcium: 7.4 mg/dL — ABNORMAL LOW (ref 8.9–10.3)
Chloride: 108 mmol/L (ref 98–111)
Creatinine, Ser: 0.83 mg/dL (ref 0.61–1.24)
GFR calc Af Amer: >60 mL/min (ref >60)
GFR calc non Af Amer: >60 mL/min (ref >60)
Glucose, Bld: 109 mg/dL — ABNORMAL HIGH (ref 70–99)
Potassium: 4 mmol/L (ref 3.5–5.1)
Sodium: 138 mmol/L (ref 135–145)

## 2019-05-01 LAB — MAGNESIUM: Magnesium: 1.9 mg/dL (ref 1.7–2.4)

## 2019-05-01 LAB — GLUCOSE, CAPILLARY
Glucose-Capillary: 105 mg/dL — ABNORMAL HIGH (ref 70–99)
Glucose-Capillary: 123 mg/dL — ABNORMAL HIGH (ref 70–99)
Glucose-Capillary: 131 mg/dL — ABNORMAL HIGH (ref 70–99)
Glucose-Capillary: 160 mg/dL — ABNORMAL HIGH (ref 70–99)

## 2019-05-01 LAB — PHOSPHORUS: Phosphorus: 2.6 mg/dL (ref 2.5–4.6)

## 2019-05-01 MED ORDER — TRAVASOL 10 % IV SOLN
INTRAVENOUS | Status: AC
  Administered 2019-05-01: 18:00:00 via INTRAVENOUS
  Filled 2019-05-01: qty 820.8

## 2019-05-01 MED ORDER — PHENOL 1.4 % MT LIQD
1.0000 | OROMUCOSAL | Status: DC | PRN
  Administered 2019-05-01: 01:00:00 1 via OROMUCOSAL
  Filled 2019-05-01: qty 177

## 2019-05-01 MED ORDER — MAGNESIUM SULFATE IN D5W 1-5 GM/100ML-% IV SOLN
1.0000 g | Freq: Once | INTRAVENOUS | Status: AC
  Administered 2019-05-01: 1 g via INTRAVENOUS
  Filled 2019-05-01: qty 100

## 2019-05-01 NOTE — Progress Notes (Signed)
Called to patient room for dislodgement of NG tube. Patient had order to remove if no nausea. Patient states he feels fine and without proper placement NG tube removed completely. Patient tolerated well. Son in room with patient. Pt resting with call bell within reach.  Will continue to monitor. Payton Emerald, RN

## 2019-05-01 NOTE — Progress Notes (Signed)
PHARMACY - ADULT TOTAL PARENTERAL NUTRITION CONSULT NOTE  Pharmacy Consult:  TPN Indication: Prolonged Ileus   Patient Measurements: Height: 5\' 10"  (177.8 cm) Weight: 185 lb 11.2 oz (84.2 kg)(scale c) IBW/kg (Calculated) : 73 TPN AdjBW (KG): 72.1 Body mass index is 26.65 kg/m.  [name] in Sept, 72 kg on admit  Assessment:  65 YOM with history T1N0 adenocarcinoma s/p LoA, right colectomy, sigmoidectomy on 11/26/2017 admitted on 04/24/19 with intra-abdominal abscesses secondary to SB perforation/infection.  He is s/p ex-lap with extensive LoA and SBR with primary anastomosis on 04/27/19. Pharmacy consulted to manage TPN for prolonged ileus.  Although patient has had several admissions with abdominal pain and surgery, he denies losing weight and reduced PO intake.  He eats 2-3 meals a day, at [employer] and [employer].  No risk for refeeding based on patient's report; however, trending down electrolytes made it possible that he is refeeding.  GI: hx GERD.  BL pre-albumin 5.8, LBM 10/16, NG O/P 372mL - PPI daily Endo: DM on metformin PTA - CBGs improving Insulin requirements in the past 24 hours: 12 units SSI + 10 units in TPN  Lytes: K 4 (goal >/= 4 for ileus), Phos 2.6, Mag 1.9 (goal >/= 2 for ileus), others WNL Renal: SCr 0.83, BUN WNL - UOP 0.7 ml/kg/hr, NS at 35 ml/hr Pulm: stable on RA Cards: Afib/HTN - intermittent high BP/HR - IV metoprolol, off Eliquis Hepatobil: LFTs / tbili / TG WNL Neuro: Alzheimer - PRN morphine ID: Zosyn 10/13 >>10/15; CTX/Flagyl (10/15 >>10/17 ) for IAI/SB perf - afebrile, WBC 13.5  TPN Access: PICC placed 04/28/19 TPN start date: 04/28/19  Nutritional Goals (RD rec 04/28/19): 2100-2300 kCal, 105-120gm protein per day  Current Nutrition: TPN  Plan:  Increase TPN to 60 ml/hr (goal rate 80 ml/hr).  Advance when lytes improve.  This TPN provides 82 gms of protein, 237 grams of dextrose and 49 grams of lipids which give total of 1625 kcals. This  meets ~80% of patient protein and calorie goals. Electrolytes in TPN: maintain increased K / Phos / Mag, max acetate  Daily multivitamin and trace elements in TPN Continue moderate Q4H and 10 units regular insulin in TPN. Discussed with MD, D/C Lantus and utilize insulin in TPN to control blood glucose - monitor D/C NS at 35 ml/hr when tonight's TPN hung Mag sulfate 1gm IV x 1 F/U AM labs, initiation of enteral diet  Alanda Slim, PharmD, [facility] Clinical Pharmacist Please see AMION for all Pharmacists' Contact Phone Numbers 05/01/2019, 7:23 AM

## 2019-05-01 NOTE — Progress Notes (Signed)
[name] PROGRESS NOTE  PRITHVI MERRIFIELD [id number] DOB: 07-May-1934 DOA: 04/24/2019 PCP: Marin Olp, MD  Brief summary   Calvin Burnett is a 85 y.o. male with medical history significant of diabetes, GERD, hypertension and prostate cancer who was recently admitted with intra-abdominal abscess due to perforated viscus from April 13, 2009 discharged home on antibiotics, history of A. fib, currently on Eliquis.  Hypertension GERD and Alzheimer's dementia, history of right-sided colectomy and sigmoidoscopy in May 2019, pneumoperitoneum who presented again with abdominal pain as well as fever.  Patient had temperature up to 102.  He was on Eliquis and no surgery was done previous admission.  His abscess was deemed not drainable at the time.  He was found to have worsening intra-abdominal abscess.  Patient has not been on antibiotics in the last few days.   Upon arrival to ED, his temperature was 100.5 blood pressure 100/67 pulse 98 respiratory 24 oxygen sat 95% on room air.  Sodium 135 potassium 3.3 chloride 103 CO2 22 glucose 142.  Creatinine 1.27 calcium 7.2.  Albumin 2.0.  White count 17.9 platelet lites 406 and hemoglobin 12.8.  COVID-19 screen is negative.  CT abdomen pelvis shows interval development of multiple intra-abdominal abscesses measure approximately 6.2 x 5.2 x 7.1 cm.  Multiple thick-walled loops of small bowel in the mid abdomen small volume pneumoperitoneum which is decreased from previous small bilateral pleural effusions with bladder wall thickening.    He was admitted under [place].  Surgery was consulted.  He underwent intra-abdominal drain placement by IR on 04/25/2019.  He was continued on broad-spectrum IV antibiotics.  Underwent exploratory laparotomy on 04/27/2019.  Patient's initial blood pressure which were drawn on 04/24/2019 turned out to be positive with Streptococcus constellatus on 04/28/2019.  Patient's antibiotics were de-escalated to Rocephin 2 g IV and  Flagyl were continued.  Transthoracic echo obtained on 04/29/2019 did not show any signs of endocarditis.  Discussed with ID and per their recommendation, spoke to cardiology for TEE next week.  Assessment/Plan:  Intra-abdominal abscesses/gram-positive bacteremia: recurrent infections. Ct abd: Interval development of multiple intra-abdominal abscesses as detailed above measuring up to approximately 6.2 x 5.2 by 7.1 cm. status post intra-abdominal drainage placed by IR on 04/25/2019.  He then underwent exploratory laparotomy on 04/27/2019, -Blood culture from 04/24/2019+ with Streptococcus constellatus, antibiotics switched to IV Rocephin 2 g daily along with Flagyl which I will continue.  Repeat blood culture from 04/30/2019 are negative thus far. Transthoracic echo negative for any signs of endocarditis.  I consulted ID and spoke to Dr. Graylon Good personally.  She recommended proceeding with TEE.  I called cardiology Master on 04/30/2019 and spoke to [name] who reassured her that patient will be on list for TEE.    Diabetes mellitus type II:  Blood sugar control.  Per dietitian, they are managing patient's electrolytes and Lantus via TPN.  Dementia: Stable.  He is alert and oriented.  Chronic kidney disease stage III: Also at baseline.  Continue monitoring  Paroxysmal atrial fibrillation: Rate is controlled.  In sinus rhythm.  Hold Eliquis.  Would recommend resuming anticoagulation as soon as possible when safe from general surgery standpoint.  Hypertension:  Blood pressure very well controlled.  Continue current IV Lopressor on telemetry start taking p.o.  We will switch medications then.  DVT prophylaxis: Lovenox. Code Status: full Family Communication: Plan of care discussed with patient.  He verbalized understanding.  No questions asked. Disposition Plan: TBD. Per general surgery.  Consultants:  Surgery  IR  Procedures:  Intra-abdominal drain placement on 04/25/2019  Exploratory  laparotomy on 04/27/2019  Antibiotics: Anti-infectives (From admission, onward)   Start     Dose/Rate Route Frequency Ordered Stop   04/29/19 1600  cefTRIAXone (ROCEPHIN) 2 g in sodium chloride 0.9 % 100 mL IVPB     2 g 200 mL/hr over 30 Minutes Intravenous Every 24 hours 04/29/19 1557     04/29/19 1600  metroNIDAZOLE (FLAGYL) IVPB 500 mg     500 mg 100 mL/hr over 60 Minutes Intravenous Every 8 hours 04/29/19 1557     04/27/19 1700  piperacillin-tazobactam (ZOSYN) IVPB 3.375 g  Status:  Discontinued     3.375 g 12.5 mL/hr over 240 Minutes Intravenous Every 8 hours 04/27/19 1640 04/29/19 1557   04/25/19 2200  vancomycin (VANCOCIN) 1,250 mg in sodium chloride 0.9 % 250 mL IVPB  Status:  Discontinued     1,250 mg 166.7 mL/hr over 90 Minutes Intravenous Every 24 hours 04/25/19 0158 04/27/19 1640   04/25/19 2100  ceFEPIme (MAXIPIME) 2 g in sodium chloride 0.9 % 100 mL IVPB  Status:  Discontinued     2 g 200 mL/hr over 30 Minutes Intravenous Every 24 hours 04/24/19 2213 04/25/19 0900   04/25/19 1400  ceFEPIme (MAXIPIME) 2 g in sodium chloride 0.9 % 100 mL IVPB  Status:  Discontinued     2 g 200 mL/hr over 30 Minutes Intravenous Every 12 hours 04/25/19 1341 04/27/19 1640   04/25/19 1000  cefTRIAXone (ROCEPHIN) 2 g in sodium chloride 0.9 % 100 mL IVPB  Status:  Discontinued     2 g 200 mL/hr over 30 Minutes Intravenous Every 24 hours 04/25/19 0841 04/25/19 1335   04/25/19 0015  metroNIDAZOLE (FLAGYL) IVPB 500 mg  Status:  Discontinued     500 mg 100 mL/hr over 60 Minutes Intravenous Every 8 hours 04/25/19 0006 04/27/19 1640   04/25/19 0000  vancomycin (VANCOCIN) 1,500 mg in sodium chloride 0.9 % 500 mL IVPB     1,500 mg 250 mL/hr over 120 Minutes Intravenous  Once 04/24/19 2355 04/25/19 0248   04/24/19 2215  ceFEPIme (MAXIPIME) 2 g in sodium chloride 0.9 % 100 mL IVPB  Status:  Discontinued     2 g 200 mL/hr over 30 Minutes Intravenous Every 24 hours 04/24/19 2211 04/24/19 2213   04/24/19  2100  ceFEPIme (MAXIPIME) 2 g in sodium chloride 0.9 % 100 mL IVPB     2 g 200 mL/hr over 30 Minutes Intravenous  Once 04/24/19 2046 04/24/19 2154   04/24/19 2100  metroNIDAZOLE (FLAGYL) IVPB 500 mg     500 mg 100 mL/hr over 60 Minutes Intravenous  Once 04/24/19 2046 04/24/19 2223       (indicate start date, and stop date if known)  HPI/Subjective: Seen and examined.  NG tube in place.  Once again denies any abdominal pain or any other complaint.  Apparently he has had bowel movements and is passing gas.  Objective: Vitals:   05/01/19 0617 05/01/19 0815  BP: 131/81 127/88  Pulse: 92 83  Resp: 17 19  Temp: 98.1 F (36.7 C) 98.1 F (36.7 C)  SpO2: 96% 98%    Intake/Output Summary (Last 24 hours) at 05/01/2019 0834 Last data filed at 05/01/2019 0537 Gross per 24 hour  Intake 1194.46 ml  Output 2010 ml  Net -815.54 ml   Filed Weights   04/25/19 1507 04/26/19 0406 04/27/19 0500  Weight: 72.1 kg 83.1 kg 84.2 kg  Exam:  General exam: Appears calm and comfortable  Respiratory system: Clear to auscultation. Respiratory effort normal. Cardiovascular system: S1 & S2 heard, RRR. No JVD, murmurs, rubs, gallops or clicks. No pedal edema. Gastrointestinal system: Abdomen is slightly distended, soft and generalized tender. No organomegaly or masses felt.  Diminished breath sounds. Central nervous system: Alert and oriented. No focal neurological deficits. Extremities: Symmetric 5 x 5 power. Skin: No rashes, lesions or ulcers.  Psychiatry: Judgement and insight appear normal. Mood & affect appropriate.   Data Reviewed: Basic Metabolic Panel: Recent Labs  Lab 04/27/19 0443 04/28/19 0434 04/29/19 0500 04/30/19 0815 05/01/19 0536  NA 139 140 139 138 138  K 3.5 4.7 3.9 3.5 4.0  CL 110 113* 109 107 108  CO2 17* 16* 21* 24 25  GLUCOSE 82 154* 257* 162* 109*  BUN 16 21 21 12 13   CREATININE 1.12 1.34* 1.26* 0.95 0.83  CALCIUM 7.1* 7.4* 7.3* 7.1* 7.4*  MG  --  1.9 2.0 1.7  1.9  PHOS  --  3.5 1.7* 1.8* 2.6   Liver Function Tests: Recent Labs  Lab 04/24/19 1848 04/25/19 0308 04/28/19 0434 04/29/19 0500  AST 26 19 33 21  ALT 25 20 25 17   ALKPHOS 91 81 90 68  BILITOT 1.1 1.7* 1.3* 0.7  PROT 6.1* 5.3* 5.1* 4.6*  ALBUMIN 2.4* 2.0* 2.0* 1.7*   Recent Labs  Lab 04/24/19 1848  LIPASE 42   No results for input(s): AMMONIA in the last 168 hours. CBC: Recent Labs  Lab 04/27/19 0443 04/28/19 0434 04/29/19 0500 04/30/19 1014 05/01/19 0536  WBC 8.1 15.8* 21.6* 14.0* 13.5*  NEUTROABS 5.7 12.9* 18.2* 10.6* 9.2*  HGB 11.8* 12.7* 11.7* 10.6* 10.4*  HCT 34.3* 37.3* 34.8* 30.1* 30.6*  MCV 93.5 93.7 94.3 94.4 93.9  PLT 367 508* 477* 405* 401*   Cardiac Enzymes: No results for input(s): CKTOTAL, CKMB, CKMBINDEX, TROPONINI in the last 168 hours. BNP (last 3 results) No results for input(s): BNP in the last 8760 hours.  ProBNP (last 3 results) Recent Labs    01/29/19 0852  PROBNP 165.0*    CBG: Recent Labs  Lab 04/30/19 1833 04/30/19 1944 04/30/19 2349 05/01/19 0538 05/01/19 0804  GLUCAP 160* 163* 96 105* 123*    Recent Results (from the past 240 hour(s))  Urine culture     Status: Abnormal   Collection Time: 04/24/19  6:53 PM   Specimen: In/Out Cath Urine  Result Value Ref Range Status   Specimen Description IN/OUT CATH URINE  Final   Special Requests   Final    added 2248 Performed at [facility], [place] [address] [place], [place] [address]    Culture 20,000 COLONIES/mL YEAST (A)  Final   Report Status 04/26/2019 FINAL  Final  Culture, blood (routine x 2)     Status: Abnormal   Collection Time: 04/24/19  9:00 PM   Specimen: BLOOD  Result Value Ref Range Status   Specimen Description BLOOD RIGHT ANTECUBITAL  Final   Special Requests   Final    BOTTLES DRAWN AEROBIC AND ANAEROBIC Blood Culture adequate volume   Culture  Setup Time   Final    GRAM POSITIVE COCCI IN CHAINS IN PAIRS IN BOTH AEROBIC AND ANAEROBIC  BOTTLES CRITICAL RESULT CALLED TO, READ BACK BY AND VERIFIED WITH: Salli Real [id number] 04/26/2019 Mena Goes Performed at [facility], [place] [address] [place], [place] [address]    Culture STREPTOCOCCUS CONSTELLATUS (A)  Final  Report Status 04/28/2019 FINAL  Final   Organism ID, Bacteria STREPTOCOCCUS CONSTELLATUS  Final      Susceptibility   Streptococcus constellatus - MIC*    PENICILLIN INTERMEDIATE Intermediate     CEFTRIAXONE 1 SENSITIVE Sensitive     ERYTHROMYCIN <=0.12 SENSITIVE Sensitive     LEVOFLOXACIN 0.5 SENSITIVE Sensitive     VANCOMYCIN 0.5 SENSITIVE Sensitive     * STREPTOCOCCUS CONSTELLATUS  Blood Culture ID Panel (Reflexed)     Status: Abnormal   Collection Time: 04/24/19  9:00 PM  Result Value Ref Range Status   Enterococcus species NOT DETECTED NOT DETECTED Final   Listeria monocytogenes NOT DETECTED NOT DETECTED Final   Staphylococcus species NOT DETECTED NOT DETECTED Final   Staphylococcus aureus (BCID) NOT DETECTED NOT DETECTED Final   Streptococcus species DETECTED (A) NOT DETECTED Final    Comment: Not Enterococcus species, Streptococcus agalactiae, Streptococcus pyogenes, or Streptococcus pneumoniae. CRITICAL RESULT CALLED TO, READ BACK BY AND VERIFIED WITH: G. ABBOTT,PHARMD ED:[id number] 04/26/2019 T. TYSOR    Streptococcus agalactiae NOT DETECTED NOT DETECTED Final   Streptococcus pneumoniae NOT DETECTED NOT DETECTED Final   Streptococcus pyogenes NOT DETECTED NOT DETECTED Final   Acinetobacter baumannii NOT DETECTED NOT DETECTED Final   Enterobacteriaceae species NOT DETECTED NOT DETECTED Final   Enterobacter cloacae complex NOT DETECTED NOT DETECTED Final   Escherichia coli NOT DETECTED NOT DETECTED Final   Klebsiella oxytoca NOT DETECTED NOT DETECTED Final   Klebsiella pneumoniae NOT DETECTED NOT DETECTED Final   Proteus species NOT DETECTED NOT DETECTED Final   Serratia marcescens NOT DETECTED NOT DETECTED Final   Haemophilus influenzae NOT  DETECTED NOT DETECTED Final   Neisseria meningitidis NOT DETECTED NOT DETECTED Final   Pseudomonas aeruginosa NOT DETECTED NOT DETECTED Final   Candida albicans NOT DETECTED NOT DETECTED Final   Candida glabrata NOT DETECTED NOT DETECTED Final   Candida krusei NOT DETECTED NOT DETECTED Final   Candida parapsilosis NOT DETECTED NOT DETECTED Final   Candida tropicalis NOT DETECTED NOT DETECTED Final    Comment: Performed at [facility], [place] [address], [place], [place] [address]  Culture, blood (routine x 2)     Status: Abnormal   Collection Time: 04/24/19  9:10 PM   Specimen: BLOOD LEFT FOREARM  Result Value Ref Range Status   Specimen Description BLOOD LEFT FOREARM  Final   Special Requests   Final    BOTTLES DRAWN AEROBIC AND ANAEROBIC Blood Culture adequate volume   Culture  Setup Time   Final    GRAM POSITIVE COCCI AEROBIC BOTTLE ONLY CRITICAL VALUE NOTED.  VALUE IS CONSISTENT WITH PREVIOUSLY REPORTED AND CALLED VALUE.    Culture (A)  Final    STREPTOCOCCUS CONSTELLATUS SUSCEPTIBILITIES PERFORMED ON PREVIOUS CULTURE WITHIN THE LAST 5 DAYS. Performed at [facility], [place] [address], [place], [place] [address]    Report Status 04/28/2019 FINAL  Final  SARS CORONAVIRUS 2 (TAT 6-24 HRS) Nasopharyngeal Nasopharyngeal Swab     Status: None   Collection Time: 04/25/19 12:11 AM   Specimen: Nasopharyngeal Swab  Result Value Ref Range Status   SARS Coronavirus 2 NEGATIVE NEGATIVE Final    Comment: (NOTE) SARS-CoV-2 target nucleic acids are NOT DETECTED. The SARS-CoV-2 RNA is generally detectable in upper and lower respiratory specimens during the acute phase of infection. Negative results do not preclude SARS-CoV-2 infection, do not rule out co-infections with other pathogens, and should not be used as the sole basis for treatment or other  patient management decisions. Negative results must be combined with clinical observations, patient history, and epidemiological  information. The expected result is Negative. Fact Sheet for Patients: SugarRoll.be Fact Sheet for Healthcare Providers: https://www.woods-mathews.com/ This test is not yet approved or cleared by the [place] FDA and  has been authorized for detection and/or diagnosis of SARS-CoV-2 by FDA under an Emergency Use Authorization (EUA). This EUA will remain  in effect (meaning this test can be used) for the duration of the COVID-19 declaration under Section 56 4(b)(1) of the Act, 21 U.S.C. section 360bbb-3(b)(1), unless the authorization is terminated or revoked sooner. Performed at [facility], [place] [address], [place], [place] [address]   Aerobic/Anaerobic Culture (surgical/deep wound)     Status: Abnormal   Collection Time: 04/25/19  2:40 PM   Specimen: Abscess  Result Value Ref Range Status   Specimen Description ABSCESS  Final   Special Requests Normal  Final   Gram Stain   Final    MODERATE WBC PRESENT, PREDOMINANTLY PMN ABUNDANT GRAM NEGATIVE RODS MODERATE GRAM POSITIVE COCCI Performed at [facility], [address] [place], [place] [address]    Culture (A)  Final    MULTIPLE ORGANISMS PRESENT, NONE PREDOMINANT MIXED ANAEROBIC FLORA PRESENT.  CALL LAB IF FURTHER IID REQUIRED.    Report Status 04/27/2019 FINAL  Final  Surgical pcr screen     Status: None   Collection Time: 04/27/19  9:06 AM   Specimen: Nasal Mucosa; Nasal Swab  Result Value Ref Range Status   MRSA, PCR NEGATIVE NEGATIVE Final   Staphylococcus aureus NEGATIVE NEGATIVE Final    Comment: (NOTE) The Xpert SA Assay (FDA approved for NASAL specimens in patients 50 years of age and older), is one component of a comprehensive surveillance program. It is not intended to diagnose infection nor to guide or monitor treatment. Performed at [facility], [place] [address], [place], [place] [address]   Culture, blood (routine x 2)     Status: None  (Preliminary result)   Collection Time: 04/30/19  4:32 PM   Specimen: BLOOD LEFT HAND  Result Value Ref Range Status   Specimen Description BLOOD LEFT HAND  Final   Special Requests   Final    BOTTLES DRAWN AEROBIC AND ANAEROBIC Blood Culture adequate volume   Culture   Final    NO GROWTH < 24 HOURS Performed at [facility], [place] [address] [place], [place] [address]    Report Status PENDING  Incomplete  Culture, blood (routine x 2)     Status: None (Preliminary result)   Collection Time: 04/30/19  4:37 PM   Specimen: BLOOD  Result Value Ref Range Status   Specimen Description BLOOD LEFT THUMB  Final   Special Requests   Final    BOTTLES DRAWN AEROBIC AND ANAEROBIC Blood Culture adequate volume   Culture   Final    NO GROWTH < 24 HOURS Performed at [facility], [place] [address], [place], [place] [address]    Report Status PENDING  Incomplete     Studies: Dg Abd Portable 1v  Result Date: 05/01/2019 CLINICAL DATA:  NG tube placement. EXAM: PORTABLE ABDOMEN - 1 VIEW COMPARISON:  Radiograph 04/29/2019 FINDINGS: Tip and side port of the enteric tube below the diaphragm in the stomach. Persistent but decreasing gaseous distention of colon in the central abdomen. Prominent air-filled small bowel loops in the left abdomen. Midline skin staples in place. IMPRESSION: 1. Tip and  side port of the enteric tube below the diaphragm in the stomach. 2. Persistent but decreasing gaseous distention of colon in the central abdomen. Electronically Signed   By: Keith Rake M.D.   On: 05/01/2019 03:05   Dg Abd Portable 1v  Result Date: 04/29/2019 CLINICAL DATA:  Nasogastric tube placement. EXAM: PORTABLE ABDOMEN - 1 VIEW COMPARISON:  April 16, 2019. FINDINGS: Distal tip of nasogastric tube is seen in proximal stomach. Status post cholecystectomy. Dilated air-filled colon is noted. Mildly dilated small bowel loops are noted. Midline surgical staples are noted. No abnormal calcifications  are noted. IMPRESSION: Distal tip of nasogastric tube seen in proximal stomach. Mildly dilated large and small bowel loops are noted concerning for ileus or distal small bowel obstruction. Electronically Signed   By: Marijo Conception M.D.   On: 04/29/2019 12:11    Scheduled Meds: . Chlorhexidine Gluconate Cloth  6 each Topical Daily  . enoxaparin (LOVENOX) injection  40 mg Subcutaneous Q24H  . insulin aspart  0-15 Units Subcutaneous Q4H  . latanoprost  1 drop Both Eyes QHS  . metoprolol tartrate  5 mg Intravenous Q8H  . pantoprazole (PROTONIX) IV  40 mg Intravenous Daily   Continuous Infusions: . sodium chloride 35 mL/hr at 04/30/19 0400  . cefTRIAXone (ROCEPHIN)  IV 2 g (04/30/19 1847)  . lactated ringers 10 mL/hr at 04/27/19 1016  . magnesium sulfate bolus IVPB    . methocarbamol (ROBAXIN) IV    . metronidazole 500 mg (05/01/19 0037)  . TPN ADULT (ION) 40 mL/hr at 04/30/19 1752  . TPN ADULT (ION)      Principal Problem:   Intra-abdominal abscess ([name]) Active Problems:   Essential hypertension   GERD (gastroesophageal reflux disease)   Type 2 diabetes mellitus ([name])   Prostate cancer ([name])   Hyperlipidemia associated with type 2 diabetes mellitus ([name])   CKD (chronic kidney disease), stage III ([name])   AF (paroxysmal atrial fibrillation) ([place])   Perforation bowel ([place])   Dementia ([place])   Pneumoperitoneum  Time spent: 27 minutes  Hawley Pavia  [name] If 7PM-7AM, please contact night-coverage at [email or web address], password [facility] 05/01/2019, 8:34 AM  LOS: 7 days

## 2019-05-01 NOTE — Progress Notes (Signed)
4 Days Post-Op   Subjective/Chief Complaint: PT with BMs yesterday and flatus Worked some with PT   Objective: Vital signs in last 24 hours: Temp:  [98.1 F (36.7 C)-99 F (37.2 C)] 98.1 F (36.7 C) (10/17 0815) Pulse Rate:  [83-102] 83 (10/17 0815) Resp:  [17-23] 19 (10/17 0815) BP: (127-154)/(81-99) 127/88 (10/17 0815) SpO2:  [95 %-98 %] 98 % (10/17 0815) Last BM Date: 04/21/19  Intake/Output from previous day: 10/16 0701 - 10/17 0700 In: 1194.5 [I.V.:994.5; IV Piggyback:200] Out: 2310 [Urine:1410; Emesis/NG output:300; Stool:600] Intake/Output this shift: No intake/output data recorded.  Constitutional: No acute distress, conversant, appears states age. Eyes: Anicteric sclerae, moist conjunctiva, no lid lag Lungs: Clear to auscultation bilaterally, normal respiratory effort CV: regular rate and rhythm, no murmurs, no peripheral edema, pedal pulses 2+ GI: Soft, no masses or hepatosplenomegaly, approp tender to palpation, inc c/d/i Skin: No rashes, palpation reveals normal turgor Psychiatric: appropriate judgment and insight, oriented to person, place, and time   Lab Results:  Recent Labs    04/30/19 1014 05/01/19 0536  WBC 14.0* 13.5*  HGB 10.6* 10.4*  HCT 30.1* 30.6*  PLT 405* 401*   BMET Recent Labs    04/30/19 0815 05/01/19 0536  NA 138 138  K 3.5 4.0  CL 107 108  CO2 24 25  GLUCOSE 162* 109*  BUN 12 13  CREATININE 0.95 0.83  CALCIUM 7.1* 7.4*   PT/INR No results for input(s): LABPROT, INR in the last 72 hours. ABG No results for input(s): PHART, HCO3 in the last 72 hours.  Invalid input(s): PCO2, PO2  Studies/Results: Dg Abd Portable 1v  Result Date: 05/01/2019 CLINICAL DATA:  NG tube placement. EXAM: PORTABLE ABDOMEN - 1 VIEW COMPARISON:  Radiograph 04/29/2019 FINDINGS: Tip and side port of the enteric tube below the diaphragm in the stomach. Persistent but decreasing gaseous distention of colon in the central abdomen. Prominent air-filled  small bowel loops in the left abdomen. Midline skin staples in place. IMPRESSION: 1. Tip and side port of the enteric tube below the diaphragm in the stomach. 2. Persistent but decreasing gaseous distention of colon in the central abdomen. Electronically Signed   By: Keith Rake M.D.   On: 05/01/2019 03:05   Dg Abd Portable 1v  Result Date: 04/29/2019 CLINICAL DATA:  Nasogastric tube placement. EXAM: PORTABLE ABDOMEN - 1 VIEW COMPARISON:  April 16, 2019. FINDINGS: Distal tip of nasogastric tube is seen in proximal stomach. Status post cholecystectomy. Dilated air-filled colon is noted. Mildly dilated small bowel loops are noted. Midline surgical staples are noted. No abnormal calcifications are noted. IMPRESSION: Distal tip of nasogastric tube seen in proximal stomach. Mildly dilated large and small bowel loops are noted concerning for ileus or distal small bowel obstruction. Electronically Signed   By: Marijo Conception M.D.   On: 04/29/2019 12:11    Anti-infectives: Anti-infectives (From admission, onward)   Start     Dose/Rate Route Frequency Ordered Stop   04/29/19 1600  cefTRIAXone (ROCEPHIN) 2 g in sodium chloride 0.9 % 100 mL IVPB     2 g 200 mL/hr over 30 Minutes Intravenous Every 24 hours 04/29/19 1557     04/29/19 1600  metroNIDAZOLE (FLAGYL) IVPB 500 mg     500 mg 100 mL/hr over 60 Minutes Intravenous Every 8 hours 04/29/19 1557     04/27/19 1700  piperacillin-tazobactam (ZOSYN) IVPB 3.375 g  Status:  Discontinued     3.375 g 12.5 mL/hr over 240 Minutes Intravenous Every 8  hours 04/27/19 1640 04/29/19 1557   04/25/19 2200  vancomycin (VANCOCIN) 1,250 mg in sodium chloride 0.9 % 250 mL IVPB  Status:  Discontinued     1,250 mg 166.7 mL/hr over 90 Minutes Intravenous Every 24 hours 04/25/19 0158 04/27/19 1640   04/25/19 2100  ceFEPIme (MAXIPIME) 2 g in sodium chloride 0.9 % 100 mL IVPB  Status:  Discontinued     2 g 200 mL/hr over 30 Minutes Intravenous Every 24 hours 04/24/19  2213 04/25/19 0900   04/25/19 1400  ceFEPIme (MAXIPIME) 2 g in sodium chloride 0.9 % 100 mL IVPB  Status:  Discontinued     2 g 200 mL/hr over 30 Minutes Intravenous Every 12 hours 04/25/19 1341 04/27/19 1640   04/25/19 1000  cefTRIAXone (ROCEPHIN) 2 g in sodium chloride 0.9 % 100 mL IVPB  Status:  Discontinued     2 g 200 mL/hr over 30 Minutes Intravenous Every 24 hours 04/25/19 0841 04/25/19 1335   04/25/19 0015  metroNIDAZOLE (FLAGYL) IVPB 500 mg  Status:  Discontinued     500 mg 100 mL/hr over 60 Minutes Intravenous Every 8 hours 04/25/19 0006 04/27/19 1640   04/25/19 0000  vancomycin (VANCOCIN) 1,500 mg in sodium chloride 0.9 % 500 mL IVPB     1,500 mg 250 mL/hr over 120 Minutes Intravenous  Once 04/24/19 2355 04/25/19 0248   04/24/19 2215  ceFEPIme (MAXIPIME) 2 g in sodium chloride 0.9 % 100 mL IVPB  Status:  Discontinued     2 g 200 mL/hr over 30 Minutes Intravenous Every 24 hours 04/24/19 2211 04/24/19 2213   04/24/19 2100  ceFEPIme (MAXIPIME) 2 g in sodium chloride 0.9 % 100 mL IVPB     2 g 200 mL/hr over 30 Minutes Intravenous  Once 04/24/19 2046 04/24/19 2154   04/24/19 2100  metroNIDAZOLE (FLAGYL) IVPB 500 mg     500 mg 100 mL/hr over 60 Minutes Intravenous  Once 04/24/19 2046 04/24/19 2223      Assessment/Plan: A fib, onEliquis- on hold Alzheimer's dementia HTN HxT1N0colon cancer of hepatic flexure, sigmoid stricture S/P robotic LOA, right colectomy, sigmoidectomy 11/26/17 CKD  Intra-abdominal abscess - probably secondary to SB perforation S/p exploratory laparotomy/ LOA/ SB resection with anastomosis 10/13 - [place] - POD#4 - start FLD, clamp NGT and maybe DC later today if tol - continue TPN - mobilize!!!  FEN -NPO/ PICC/ TNA until bowel function returns  VTE -Eliquis on hold, last dose 10/8 per chart,Lovenox  ID - WBC 14, afebrile - DC abx    LOS: 7 days    Ralene Ok 05/01/2019

## 2019-05-01 NOTE — Progress Notes (Signed)
Occupational Therapy Treatment Patient Details Name: Calvin Burnett MRN: [id number] DOB: 1933-07-17 Today's Date: 05/01/2019    History of present illness Calvin Burnett is a 85 y.o. male with PMH: a-fib, alxheimer's dementia, diabetes, GERD, hypertension and prostate cancer who was recently admitted with intra-abdominal abscess due to perforated viscus from April 13, 2009 discharged home on antibiotics. PSH: history of right-sided colectomy and sigmoidoscopy in May 2019. Pt admitted for worsening abd pain. Pt underwent abdominal ex lap on10/13.  Abd xray 04/29/19: concerning for ileusor distal small bowel obstruction.   OT comments  Pt making steady progress towards OT goals this session. Session focus on functional mobility as precursor to higher level ADLs. Pt MIN guard for sit>stand from recliner with RW; cues to push up from seated surface rather than RW. Pt able to complete functional mobility out into hallway this session with RW and MIN A. Pt limited by decreased activity tolerance but making steady progress. Pt fatigued after ambulation declining further ADLs. Will continue to follow acutely to progress functional mobility and assist with ADL participation. DC plan remains appropriate.     Follow Up Recommendations  Home health OT;Supervision/Assistance - 24 hour    Equipment Recommendations  3 in 1 bedside commode    Recommendations for Other Services      Precautions / Restrictions Precautions Precautions: Fall Restrictions Weight Bearing Restrictions: No       Mobility Bed Mobility               General bed mobility comments: OOB in recliner  Transfers Overall transfer level: Needs assistance Equipment used: Rolling walker (2 wheeled) Transfers: Sit to/from Stand Sit to Stand: Min guard         General transfer comment: cues to push up from seated surface rather than pulling on RW    Balance Overall balance assessment: Needs  assistance Sitting-balance support: Feet supported Sitting balance-Leahy Scale: Good       Standing balance-Leahy Scale: Poor Standing balance comment: reliant on BUE support during transfer                           ADL either performed or assessed with clinical judgement   ADL Overall ADL's : Needs assistance/impaired                 Upper Body Dressing : Sitting;Minimal assistance Upper Body Dressing Details (indicate cue type and reason): MIN A to don hospital gown in sitting     Toilet Transfer: RW;Minimal assistance;Cueing for safety;Ambulation Toilet Transfer Details (indicate cue type and reason): [place] simulated after functional mobility; cues for hand placment, +2 may be helpful for line and NG tube managment         Functional mobility during ADLs: Minimal assistance;Rolling walker General ADL Comments: pt completed functional mobility with RW into hallway with MIN A for safety and line managment; pt declined further  ADLS     Vision Baseline Vision/History: Wears glasses Wears Glasses: At all times     Perception     Praxis      Cognition Arousal/Alertness: Awake/alert Behavior During Therapy: Flat affect Overall Cognitive Status: History of cognitive impairments - at baseline                                 General Comments: pt oriented, following commands and demo's appropriate processing. Per chart pt has baseline  alzheimers dementia        Exercises     Shoulder Instructions       General Comments HR MAX 107    Pertinent Vitals/ Pain       Pain Assessment: No/denies pain  Home Living                                          Prior Functioning/Environment              Frequency  Min 2X/week        Progress Toward Goals  OT Goals(current goals can now be found in the care plan section)  Progress towards OT goals: Progressing toward goals  Acute Rehab OT Goals Patient Stated  Goal: to get better and go home OT Goal Formulation: With patient Time For Goal Achievement: 05/12/19 Potential to Achieve Goals: Good  Plan Discharge plan remains appropriate    Co-evaluation                 AM-PAC OT "6 Clicks" Daily Activity     Outcome Measure   Help from another person eating meals?: Total(NPO) Help from another person taking care of personal grooming?: None Help from another person toileting, which includes using toliet, bedpan, or urinal?: A Little Help from another person bathing (including washing, rinsing, drying)?: A Little Help from another person to put on and taking off regular upper body clothing?: A Little Help from another person to put on and taking off regular lower body clothing?: A Little 6 Click Score: 17    End of Session Equipment Utilized During Treatment: Rolling walker  OT Visit Diagnosis: Unsteadiness on feet (R26.81);Muscle weakness (generalized) (M62.81);Pain   Activity Tolerance Patient tolerated treatment well   Patient Left in chair;with call bell/phone within reach;with nursing/sitter in room   Nurse Communication Mobility status        Time: [id number] OT Time Calculation (min): 21 min  Charges: OT General Charges $OT Visit: 1 Visit OT Treatments $Therapeutic Activity: 8-22 mins  [place], [place] [phone] [place] 05/01/2019, 11:33 AM

## 2019-05-02 LAB — CBC WITH DIFFERENTIAL/PLATELET
Abs Immature Granulocytes: 0.2 10*3/uL — ABNORMAL HIGH (ref 0.00–0.07)
Basophils Absolute: 0.1 10*3/uL (ref 0.0–0.1)
Basophils Relative: 1 %
Eosinophils Absolute: 0 10*3/uL (ref 0.0–0.5)
Eosinophils Relative: 0 %
HCT: 30 % — ABNORMAL LOW (ref 39.0–52.0)
Hemoglobin: 10.3 g/dL — ABNORMAL LOW (ref 13.0–17.0)
Lymphocytes Relative: 9 %
Lymphs Abs: 1 10*3/uL (ref 0.7–4.0)
MCH: 32.3 pg (ref 26.0–34.0)
MCHC: 34.3 g/dL (ref 30.0–36.0)
MCV: 94 fL (ref 80.0–100.0)
Monocytes Absolute: 0.3 10*3/uL (ref 0.1–1.0)
Monocytes Relative: 3 %
Myelocytes: 2 %
Neutro Abs: 9.3 10*3/uL — ABNORMAL HIGH (ref 1.7–7.7)
Neutrophils Relative %: 85 %
Platelets: 386 10*3/uL (ref 150–400)
RBC: 3.19 MIL/uL — ABNORMAL LOW (ref 4.22–5.81)
RDW: 14.6 % (ref 11.5–15.5)
WBC: 10.9 10*3/uL — ABNORMAL HIGH (ref 4.0–10.5)
nRBC: 0 % (ref 0.0–0.2)

## 2019-05-02 LAB — GLUCOSE, CAPILLARY
Glucose-Capillary: 126 mg/dL — ABNORMAL HIGH (ref 70–99)
Glucose-Capillary: 130 mg/dL — ABNORMAL HIGH (ref 70–99)
Glucose-Capillary: 145 mg/dL — ABNORMAL HIGH (ref 70–99)
Glucose-Capillary: 151 mg/dL — ABNORMAL HIGH (ref 70–99)
Glucose-Capillary: 156 mg/dL — ABNORMAL HIGH (ref 70–99)
Glucose-Capillary: 157 mg/dL — ABNORMAL HIGH (ref 70–99)
Glucose-Capillary: 173 mg/dL — ABNORMAL HIGH (ref 70–99)
Glucose-Capillary: 192 mg/dL — ABNORMAL HIGH (ref 70–99)

## 2019-05-02 LAB — BASIC METABOLIC PANEL
Anion gap: 8 (ref 5–15)
BUN: 13 mg/dL (ref 8–23)
CO2: 24 mmol/L (ref 22–32)
Calcium: 7.3 mg/dL — ABNORMAL LOW (ref 8.9–10.3)
Chloride: 105 mmol/L (ref 98–111)
Creatinine, Ser: 0.85 mg/dL (ref 0.61–1.24)
GFR calc Af Amer: >60 mL/min (ref >60)
GFR calc non Af Amer: >60 mL/min (ref >60)
Glucose, Bld: 123 mg/dL — ABNORMAL HIGH (ref 70–99)
Potassium: 4.3 mmol/L (ref 3.5–5.1)
Sodium: 137 mmol/L (ref 135–145)

## 2019-05-02 LAB — MAGNESIUM: Magnesium: 2 mg/dL (ref 1.7–2.4)

## 2019-05-02 LAB — PHOSPHORUS: Phosphorus: 3.1 mg/dL (ref 2.5–4.6)

## 2019-05-02 MED ORDER — APIXABAN 5 MG PO TABS
5.0000 mg | ORAL_TABLET | Freq: Two times a day (BID) | ORAL | Status: DC
  Administered 2019-05-02 – 2019-05-11 (×18): 5 mg via ORAL
  Filled 2019-05-02 (×19): qty 1

## 2019-05-02 MED ORDER — AMLODIPINE BESYLATE 5 MG PO TABS
5.0000 mg | ORAL_TABLET | Freq: Every day | ORAL | Status: DC
  Administered 2019-05-02 – 2019-05-11 (×8): 5 mg via ORAL
  Filled 2019-05-02 (×10): qty 1

## 2019-05-02 MED ORDER — METOPROLOL SUCCINATE ER 25 MG PO TB24
12.5000 mg | ORAL_TABLET | Freq: Every day | ORAL | Status: DC
  Administered 2019-05-02 – 2019-05-11 (×8): 12.5 mg via ORAL
  Filled 2019-05-02 (×10): qty 1

## 2019-05-02 MED ORDER — TRAVASOL 10 % IV SOLN
INTRAVENOUS | Status: AC
  Administered 2019-05-02: 18:00:00 via INTRAVENOUS
  Filled 2019-05-02: qty 1094.4

## 2019-05-02 NOTE — Progress Notes (Signed)
PHARMACY - ADULT TOTAL PARENTERAL NUTRITION CONSULT NOTE  Pharmacy Consult:  TPN Indication: Prolonged Ileus   Patient Measurements: Height: 5\' 10"  (177.8 cm) Weight: 185 lb 11.2 oz (84.2 kg)(scale c) IBW/kg (Calculated) : 73 TPN AdjBW (KG): 72.1 Body mass index is 26.65 kg/m.  [name] in Sept, 72 kg on admit  Assessment:  [name] with history T1N0 adenocarcinoma s/p LoA, right colectomy, sigmoidectomy on 11/26/2017 admitted on 04/24/19 with intra-abdominal abscesses secondary to SB perforation/infection.  He is s/p ex-lap with extensive LoA and SBR with primary anastomosis on 04/27/19. Pharmacy consulted to manage TPN for prolonged ileus.  Although patient has had several admissions with abdominal pain and surgery, he denies losing weight and reduced PO intake.  He eats 2-3 meals a day, at [employer] and [employer].  No risk for refeeding based on patient's report; however, trending down electrolytes made it possible that he is refeeding.  GI: hx GERD.  BL pre-albumin 5.8, LBM 10/16, NG O/P 15mL - PPI daily Endo: DM on metformin PTA - CBGs improving Insulin requirements in the past 24 hours: 14 units SSI + 10 units in TPN  Lytes: K 4.3 (goal >/= 4 for ileus), Phos 3.1, Mag 2 (goal >/= 2 for ileus), others WNL Renal: SCr 0.85, BUN WNL - UOP 0.5 ml/kg/hr Pulm: stable on RA Cards: Afib/HTN - intermittent high BP/HR - IV metoprolol, off Eliquis Hepatobil: LFTs / tbili / TG WNL Neuro: Alzheimer - PRN morphine ID: Zosyn 10/13 >>10/15; CTX/Flagyl 10/15 >>10/17 for IAI/SB perf - afebrile, WBC 10.9  TPN Access: PICC placed 04/28/19 TPN start date: 04/28/19  Nutritional Goals (RD rec 04/28/19): 2100-2300 kCal, 105-120gm protein per day  Current Nutrition: TPN Started on FLD - no intake documented  Plan:  Increase TPN to goal rate of 80 ml/hr.   This TPN provides 109 gms of protein, 317 grams of dextrose and 65 grams of lipids which give total of 2168 kcals. This meets 100% of  patient protein and calorie goals. Electrolytes in TPN: maintain increased K / Phos / Mag, max acetate  Daily multivitamin and trace elements in TPN Continue moderate Q4H and 10 units regular insulin in TPN. Discussed with MD, D/C Lantus and utilize insulin in TPN to control blood glucose - monitor F/U AM labs, CBGs, and initiation of enteral diet  Alanda Slim, PharmD, [facility] Clinical Pharmacist Please see AMION for all Pharmacists' Contact Phone Numbers 05/02/2019, 9:08 AM

## 2019-05-02 NOTE — Progress Notes (Signed)
[name] PROGRESS NOTE  [name] [id number] DOB: 1934/02/01 DOA: 04/24/2019 PCP: Marin Olp, MD  Brief summary   Calvin Burnett is a 85 y.o. male with medical history significant of diabetes, GERD, hypertension and prostate cancer who was recently admitted with intra-abdominal abscess due to perforated viscus from April 13, 2009 discharged home on antibiotics, history of A. fib, currently on Eliquis.  Hypertension GERD and Alzheimer's dementia, history of right-sided colectomy and sigmoidoscopy in May 2019, pneumoperitoneum who presented again with abdominal pain as well as fever.  Patient had temperature up to 102.  He was on Eliquis and no surgery was done previous admission.  His abscess was deemed not drainable at the time.  He was found to have worsening intra-abdominal abscess.  Patient has not been on antibiotics in the last few days.   Upon arrival to ED, his temperature was 100.5 blood pressure 100/67 pulse 98 respiratory 24 oxygen sat 95% on room air.  Sodium 135 potassium 3.3 chloride 103 CO2 22 glucose 142.  Creatinine 1.27 calcium 7.2.  Albumin 2.0.  White count 17.9 platelet lites 406 and hemoglobin 12.8.  COVID-19 screen is negative.  CT abdomen pelvis shows interval development of multiple intra-abdominal abscesses measure approximately 6.2 x 5.2 x 7.1 cm.  Multiple thick-walled loops of small bowel in the mid abdomen small volume pneumoperitoneum which is decreased from previous small bilateral pleural effusions with bladder wall thickening.    He was admitted under [facility].  Surgery was consulted.  He underwent intra-abdominal drain placement by IR on 04/25/2019.  He was continued on broad-spectrum IV antibiotics.  Underwent exploratory laparotomy on 04/27/2019.  Patient's initial blood pressure which were drawn on 04/24/2019 turned out to be positive with Streptococcus constellatus on 04/28/2019.  Patient's antibiotics were de-escalated to Rocephin 2 g IV and  Flagyl were continued.  Transthoracic echo obtained on 04/29/2019 did not show any signs of endocarditis.  Discussed with ID and per their recommendation, spoke to cardiology for TEE next week.  Assessment/Plan:  Intra-abdominal abscesses/gram-positive bacteremia: recurrent infections. Ct abd: Interval development of multiple intra-abdominal abscesses as detailed above measuring up to approximately 6.2 x 5.2 by 7.1 cm. status post intra-abdominal drainage placed by IR on 04/25/2019.  He then underwent exploratory laparotomy on 04/27/2019, -Blood culture from 04/24/2019+ with Streptococcus constellatus, antibiotics switched to IV Rocephin 2 g daily along with Flagyl which I will continue.  Repeat blood culture from 04/30/2019 are negative thus far. Transthoracic echo negative for any signs of endocarditis.  I consulted ID and spoke to Dr. Graylon Good personally.  She recommended proceeding with TEE.  I called cardiology Master on 04/30/2019 and spoke to [place] who reassured her that patient will be on list for TEE.    Diabetes mellitus type II:  Blood sugar control.  Per dietitian, they are managing patient's electrolytes and Lantus via TPN.  Dementia: Stable.  He is alert and oriented.  Chronic kidney disease stage III: Also at baseline.  Continue monitoring  Paroxysmal atrial fibrillation: Rate is controlled.  In sinus rhythm.  Hold Eliquis.  Would recommend resuming anticoagulation as soon as possible when safe from general surgery standpoint.  Hypertension:  Blood pressure very well controlled.  Now that he is on liquid diet and his bowel function seems to have started turning, I will resume his oral amlodipine and beta-blocker and stop IV beta-blocker.  DVT prophylaxis: Lovenox. Code Status: full Family Communication: Plan of care discussed with patient.  He verbalized understanding.  No questions asked. Disposition Plan: TBD. Per general  surgery.  Consultants:  Surgery  IR  Procedures:  Intra-abdominal drain placement on 04/25/2019  Exploratory laparotomy on 04/27/2019  Antibiotics: Anti-infectives (From admission, onward)   Start     Dose/Rate Route Frequency Ordered Stop   04/29/19 1600  cefTRIAXone (ROCEPHIN) 2 g in sodium chloride 0.9 % 100 mL IVPB  Status:  Discontinued     2 g 200 mL/hr over 30 Minutes Intravenous Every 24 hours 04/29/19 1557 05/01/19 0900   04/29/19 1600  metroNIDAZOLE (FLAGYL) IVPB 500 mg  Status:  Discontinued     500 mg 100 mL/hr over 60 Minutes Intravenous Every 8 hours 04/29/19 1557 05/01/19 1341   04/27/19 1700  piperacillin-tazobactam (ZOSYN) IVPB 3.375 g  Status:  Discontinued     3.375 g 12.5 mL/hr over 240 Minutes Intravenous Every 8 hours 04/27/19 1640 04/29/19 1557   04/25/19 2200  vancomycin (VANCOCIN) 1,250 mg in sodium chloride 0.9 % 250 mL IVPB  Status:  Discontinued     1,250 mg 166.7 mL/hr over 90 Minutes Intravenous Every 24 hours 04/25/19 0158 04/27/19 1640   04/25/19 2100  ceFEPIme (MAXIPIME) 2 g in sodium chloride 0.9 % 100 mL IVPB  Status:  Discontinued     2 g 200 mL/hr over 30 Minutes Intravenous Every 24 hours 04/24/19 2213 04/25/19 0900   04/25/19 1400  ceFEPIme (MAXIPIME) 2 g in sodium chloride 0.9 % 100 mL IVPB  Status:  Discontinued     2 g 200 mL/hr over 30 Minutes Intravenous Every 12 hours 04/25/19 1341 04/27/19 1640   04/25/19 1000  cefTRIAXone (ROCEPHIN) 2 g in sodium chloride 0.9 % 100 mL IVPB  Status:  Discontinued     2 g 200 mL/hr over 30 Minutes Intravenous Every 24 hours 04/25/19 0841 04/25/19 1335   04/25/19 0015  metroNIDAZOLE (FLAGYL) IVPB 500 mg  Status:  Discontinued     500 mg 100 mL/hr over 60 Minutes Intravenous Every 8 hours 04/25/19 0006 04/27/19 1640   04/25/19 0000  vancomycin (VANCOCIN) 1,500 mg in sodium chloride 0.9 % 500 mL IVPB     1,500 mg 250 mL/hr over 120 Minutes Intravenous  Once 04/24/19 2355 04/25/19 0248   04/24/19  2215  ceFEPIme (MAXIPIME) 2 g in sodium chloride 0.9 % 100 mL IVPB  Status:  Discontinued     2 g 200 mL/hr over 30 Minutes Intravenous Every 24 hours 04/24/19 2211 04/24/19 2213   04/24/19 2100  ceFEPIme (MAXIPIME) 2 g in sodium chloride 0.9 % 100 mL IVPB     2 g 200 mL/hr over 30 Minutes Intravenous  Once 04/24/19 2046 04/24/19 2154   04/24/19 2100  metroNIDAZOLE (FLAGYL) IVPB 500 mg     500 mg 100 mL/hr over 60 Minutes Intravenous  Once 04/24/19 2046 04/24/19 2223       (indicate start date, and stop date if known)  HPI/Subjective: Patient seen and examined.  Looks comfortable.  No complaint.  Objective: Vitals:   05/01/19 2048 05/02/19 0434  BP: 127/84 137/89  Pulse: 86 94  Resp: 20 (!) 22  Temp: 98.1 F (36.7 C) 97.8 F (36.6 C)  SpO2: 97% 97%    Intake/Output Summary (Last 24 hours) at 05/02/2019 0824 Last data filed at 05/02/2019 0636 Gross per 24 hour  Intake 562.43 ml  Output 1100 ml  Net -537.57 ml   Filed Weights   04/25/19 1507 04/26/19 0406 04/27/19 0500  Weight: 72.1 kg 83.1 kg 84.2 kg  Exam:  General exam: Appears calm and comfortable  Respiratory system: Clear to auscultation. Respiratory effort normal. Cardiovascular system: S1 & S2 heard, irregularly regular rate and rhythm. No JVD, murmurs, rubs, gallops or clicks. No pedal edema. Gastrointestinal system: Abdomen is nondistended, soft and mild generalized tenderness. No organomegaly or masses felt. Normal bowel sounds heard. Central nervous system: Alert and oriented. No focal neurological deficits. Extremities: Symmetric 5 x 5 power. Skin: No rashes, lesions or ulcers.  Psychiatry: Judgement and insight appear poor. Mood & affect appropriate.    Data Reviewed: Basic Metabolic Panel: Recent Labs  Lab 04/28/19 0434 04/29/19 0500 04/30/19 0815 05/01/19 0536 05/02/19 0439  NA 140 139 138 138 137  K 4.7 3.9 3.5 4.0 4.3  CL 113* 109 107 108 105  CO2 16* 21* 24 25 24   GLUCOSE 154* 257*  162* 109* 123*  BUN 21 21 12 13 13   CREATININE 1.34* 1.26* 0.95 0.83 0.85  CALCIUM 7.4* 7.3* 7.1* 7.4* 7.3*  MG 1.9 2.0 1.7 1.9 2.0  PHOS 3.5 1.7* 1.8* 2.6 3.1   Liver Function Tests: Recent Labs  Lab 04/28/19 0434 04/29/19 0500  AST 33 21  ALT 25 17  ALKPHOS 90 68  BILITOT 1.3* 0.7  PROT 5.1* 4.6*  ALBUMIN 2.0* 1.7*   No results for input(s): LIPASE, AMYLASE in the last 168 hours. No results for input(s): AMMONIA in the last 168 hours. CBC: Recent Labs  Lab 04/28/19 0434 04/29/19 0500 04/30/19 1014 05/01/19 0536 05/02/19 0439  WBC 15.8* 21.6* 14.0* 13.5* 10.9*  NEUTROABS 12.9* 18.2* 10.6* 9.2* PENDING  HGB 12.7* 11.7* 10.6* 10.4* 10.3*  HCT 37.3* 34.8* 30.1* 30.6* 30.0*  MCV 93.7 94.3 94.4 93.9 94.0  PLT 508* 477* 405* 401* 386   Cardiac Enzymes: No results for input(s): CKTOTAL, CKMB, CKMBINDEX, TROPONINI in the last 168 hours. BNP (last 3 results) No results for input(s): BNP in the last 8760 hours.  ProBNP (last 3 results) Recent Labs    01/29/19 0852  PROBNP 165.0*    CBG: Recent Labs  Lab 05/01/19 1608 05/01/19 2049 05/02/19 0016 05/02/19 0437 05/02/19 0753  GLUCAP 160* 151* 130* 126* 145*    Recent Results (from the past 240 hour(s))  Urine culture     Status: Abnormal   Collection Time: 04/24/19  6:53 PM   Specimen: In/Out Cath Urine  Result Value Ref Range Status   Specimen Description IN/OUT CATH URINE  Final   Special Requests   Final    added 2248 Performed at [facility], [place] [address] [place], [place] [address]    Culture 20,000 COLONIES/mL YEAST (A)  Final   Report Status 04/26/2019 FINAL  Final  Culture, blood (routine x 2)     Status: Abnormal   Collection Time: 04/24/19  9:00 PM   Specimen: BLOOD  Result Value Ref Range Status   Specimen Description BLOOD RIGHT ANTECUBITAL  Final   Special Requests   Final    BOTTLES DRAWN AEROBIC AND ANAEROBIC Blood Culture adequate volume   Culture  Setup Time   Final    GRAM  POSITIVE COCCI IN CHAINS IN PAIRS IN BOTH AEROBIC AND ANAEROBIC BOTTLES CRITICAL RESULT CALLED TO, READ BACK BY AND VERIFIED WITH: Salli Real ED:[id number] 04/26/2019 Mena Goes Performed at [facility], [place] [address], [place], [place] [address]    Culture STREPTOCOCCUS CONSTELLATUS (A)  Final   Report Status 04/28/2019 FINAL  Final   Organism ID, Bacteria STREPTOCOCCUS CONSTELLATUS  Final      Susceptibility   Streptococcus constellatus - MIC*    PENICILLIN INTERMEDIATE Intermediate     CEFTRIAXONE 1 SENSITIVE Sensitive     ERYTHROMYCIN <=0.12 SENSITIVE Sensitive     LEVOFLOXACIN 0.5 SENSITIVE Sensitive     VANCOMYCIN 0.5 SENSITIVE Sensitive     * STREPTOCOCCUS CONSTELLATUS  Blood Culture ID Panel (Reflexed)     Status: Abnormal   Collection Time: 04/24/19  9:00 PM  Result Value Ref Range Status   Enterococcus species NOT DETECTED NOT DETECTED Final   Listeria monocytogenes NOT DETECTED NOT DETECTED Final   Staphylococcus species NOT DETECTED NOT DETECTED Final   Staphylococcus aureus (BCID) NOT DETECTED NOT DETECTED Final   Streptococcus species DETECTED (A) NOT DETECTED Final    Comment: Not Enterococcus species, Streptococcus agalactiae, Streptococcus pyogenes, or Streptococcus pneumoniae. CRITICAL RESULT CALLED TO, READ BACK BY AND VERIFIED WITH: G. ABBOTT,PHARMD EB:[id number] 04/26/2019 T. TYSOR    Streptococcus agalactiae NOT DETECTED NOT DETECTED Final   Streptococcus pneumoniae NOT DETECTED NOT DETECTED Final   Streptococcus pyogenes NOT DETECTED NOT DETECTED Final   Acinetobacter baumannii NOT DETECTED NOT DETECTED Final   Enterobacteriaceae species NOT DETECTED NOT DETECTED Final   Enterobacter cloacae complex NOT DETECTED NOT DETECTED Final   Escherichia coli NOT DETECTED NOT DETECTED Final   Klebsiella oxytoca NOT DETECTED NOT DETECTED Final   Klebsiella pneumoniae NOT DETECTED NOT DETECTED Final   Proteus species NOT DETECTED NOT DETECTED Final   Serratia marcescens NOT  DETECTED NOT DETECTED Final   Haemophilus influenzae NOT DETECTED NOT DETECTED Final   Neisseria meningitidis NOT DETECTED NOT DETECTED Final   Pseudomonas aeruginosa NOT DETECTED NOT DETECTED Final   Candida albicans NOT DETECTED NOT DETECTED Final   Candida glabrata NOT DETECTED NOT DETECTED Final   Candida krusei NOT DETECTED NOT DETECTED Final   Candida parapsilosis NOT DETECTED NOT DETECTED Final   Candida tropicalis NOT DETECTED NOT DETECTED Final    Comment: Performed at [facility], [place] [address], [place], [place] [address]  Culture, blood (routine x 2)     Status: Abnormal   Collection Time: 04/24/19  9:10 PM   Specimen: BLOOD LEFT FOREARM  Result Value Ref Range Status   Specimen Description BLOOD LEFT FOREARM  Final   Special Requests   Final    BOTTLES DRAWN AEROBIC AND ANAEROBIC Blood Culture adequate volume   Culture  Setup Time   Final    GRAM POSITIVE COCCI AEROBIC BOTTLE ONLY CRITICAL VALUE NOTED.  VALUE IS CONSISTENT WITH PREVIOUSLY REPORTED AND CALLED VALUE.    Culture (A)  Final    STREPTOCOCCUS CONSTELLATUS SUSCEPTIBILITIES PERFORMED ON PREVIOUS CULTURE WITHIN THE LAST 5 DAYS. Performed at [facility], [place] [address] [place], [place] [address]    Report Status 04/28/2019 FINAL  Final  SARS CORONAVIRUS 2 (TAT 6-24 HRS) Nasopharyngeal Nasopharyngeal Swab     Status: None   Collection Time: 04/25/19 12:11 AM   Specimen: Nasopharyngeal Swab  Result Value Ref Range Status   SARS Coronavirus 2 NEGATIVE NEGATIVE Final    Comment: (NOTE) SARS-CoV-2 target nucleic acids are NOT DETECTED. The SARS-CoV-2 RNA is generally detectable in upper and lower respiratory specimens during the acute phase of infection. Negative results do not preclude SARS-CoV-2 infection, do not rule out co-infections with other pathogens, and should not be used as the sole basis for treatment or other patient management decisions. Negative results must be combined with  clinical observations, patient history,  and epidemiological information. The expected result is Negative. Fact Sheet for Patients: SugarRoll.be Fact Sheet for Healthcare Providers: https://www.woods-mathews.com/ This test is not yet approved or cleared by the [place] FDA and  has been authorized for detection and/or diagnosis of SARS-CoV-2 by FDA under an Emergency Use Authorization (EUA). This EUA will remain  in effect (meaning this test can be used) for the duration of the COVID-19 declaration under Section 56 4(b)(1) of the Act, 21 U.S.C. section 360bbb-3(b)(1), unless the authorization is terminated or revoked sooner. Performed at [facility], [place] [address] [place], [place] [address]   Aerobic/Anaerobic Culture (surgical/deep wound)     Status: Abnormal   Collection Time: 04/25/19  2:40 PM   Specimen: Abscess  Result Value Ref Range Status   Specimen Description ABSCESS  Final   Special Requests Normal  Final   Gram Stain   Final    MODERATE WBC PRESENT, PREDOMINANTLY PMN ABUNDANT GRAM NEGATIVE RODS MODERATE GRAM POSITIVE COCCI Performed at [facility], [address], [place], [place] [address]    Culture (A)  Final    MULTIPLE ORGANISMS PRESENT, NONE PREDOMINANT MIXED ANAEROBIC FLORA PRESENT.  CALL LAB IF FURTHER IID REQUIRED.    Report Status 04/27/2019 FINAL  Final  Surgical pcr screen     Status: None   Collection Time: 04/27/19  9:06 AM   Specimen: Nasal Mucosa; Nasal Swab  Result Value Ref Range Status   MRSA, PCR NEGATIVE NEGATIVE Final   Staphylococcus aureus NEGATIVE NEGATIVE Final    Comment: (NOTE) The Xpert SA Assay (FDA approved for NASAL specimens in patients 72 years of age and older), is one component of a comprehensive surveillance program. It is not intended to diagnose infection nor to guide or monitor treatment. Performed at [facility], [place] [address] [place],  [place] [address]   Culture, blood (routine x 2)     Status: None (Preliminary result)   Collection Time: 04/30/19  4:32 PM   Specimen: BLOOD LEFT HAND  Result Value Ref Range Status   Specimen Description BLOOD LEFT HAND  Final   Special Requests   Final    BOTTLES DRAWN AEROBIC AND ANAEROBIC Blood Culture adequate volume   Culture   Final    NO GROWTH 2 DAYS Performed at [facility], [place] [address] [place], [place] [address]    Report Status PENDING  Incomplete  Culture, blood (routine x 2)     Status: None (Preliminary result)   Collection Time: 04/30/19  4:37 PM   Specimen: BLOOD  Result Value Ref Range Status   Specimen Description BLOOD LEFT THUMB  Final   Special Requests   Final    BOTTLES DRAWN AEROBIC AND ANAEROBIC Blood Culture adequate volume   Culture   Final    NO GROWTH 2 DAYS Performed at [facility], [place] [address] [place], [place] [address]    Report Status PENDING  Incomplete     Studies: Dg Abd Portable 1v  Result Date: 05/01/2019 CLINICAL DATA:  NG tube placement. EXAM: PORTABLE ABDOMEN - 1 VIEW COMPARISON:  Radiograph 04/29/2019 FINDINGS: Tip and side port of the enteric tube below the diaphragm in the stomach. Persistent but decreasing gaseous distention of colon in the central abdomen. Prominent air-filled small bowel loops in the left abdomen. Midline skin staples in place. IMPRESSION: 1. Tip and side port of the enteric tube below the diaphragm in the stomach. 2. Persistent but decreasing  gaseous distention of colon in the central abdomen. Electronically Signed   By: Keith Rake M.D.   On: 05/01/2019 03:05    Scheduled Meds: . Chlorhexidine Gluconate Cloth  6 each Topical Daily  . enoxaparin (LOVENOX) injection  40 mg Subcutaneous Q24H  . insulin aspart  0-15 Units Subcutaneous Q4H  . latanoprost  1 drop Both Eyes QHS  . metoprolol tartrate  5 mg Intravenous Q8H  . pantoprazole (PROTONIX) IV  40 mg Intravenous Daily   Continuous  Infusions: . lactated ringers 10 mL/hr at 04/27/19 1016  . methocarbamol (ROBAXIN) IV    . TPN ADULT (ION) 60 mL/hr at 05/02/19 0300    Principal Problem:   Intra-abdominal abscess ([name]) Active Problems:   Essential hypertension   GERD (gastroesophageal reflux disease)   Type 2 diabetes mellitus ([name])   Prostate cancer ([name])   Hyperlipidemia associated with type 2 diabetes mellitus ([place])   CKD (chronic kidney disease), stage III ([name])   AF (paroxysmal atrial fibrillation) ([place])   Perforation bowel ([place])   Dementia ([place])   Pneumoperitoneum  Time spent: 28 minutes  [place]  [name] If 7PM-7AM, please contact night-coverage at [email or web address], password [facility] 05/02/2019, 8:24 AM  LOS: 8 days

## 2019-05-02 NOTE — Progress Notes (Signed)
5 Days Post-Op   Subjective/Chief Complaint: PT tol FLD yesterday NGT removed + flatus, - BMs  Objective: Vital signs in last 24 hours: Temp:  [97.8 F (36.6 C)-98.1 F (36.7 C)] 97.8 F (36.6 C) (10/18 0434) Pulse Rate:  [80-104] 94 (10/18 0434) Resp:  [17-22] 22 (10/18 0434) BP: (125-137)/(79-98) 137/89 (10/18 0434) SpO2:  [95 %-97 %] 97 % (10/18 0434) Last BM Date: 05/01/19  Intake/Output from previous day: 10/17 0701 - 10/18 0700 In: 562.4 [I.V.:562.4] Out: 1100 [Urine:1100] Intake/Output this shift: No intake/output data recorded.  PE: Constitutional: No acute distress, conversant, appears states age. Eyes: Anicteric sclerae, moist conjunctiva, no lid lag Lungs: Clear to auscultation bilaterally, normal respiratory effort CV: regular rate and rhythm, no murmurs, no peripheral edema, pedal pulses 2+ GI: Soft, no masses or hepatosplenomegaly, approp tender to palpation, inc c/d/i Skin: No rashes, palpation reveals normal turgor Psychiatric: appropriate judgment and insight, oriented to person, place, and time  Lab Results:  Recent Labs    05/01/19 0536 05/02/19 0439  WBC 13.5* 10.9*  HGB 10.4* 10.3*  HCT 30.6* 30.0*  PLT 401* 386   BMET Recent Labs    05/01/19 0536 05/02/19 0439  NA 138 137  K 4.0 4.3  CL 108 105  CO2 25 24  GLUCOSE 109* 123*  BUN 13 13  CREATININE 0.83 0.85  CALCIUM 7.4* 7.3*   Studies/Results: Dg Abd Portable 1v  Result Date: 05/01/2019 CLINICAL DATA:  NG tube placement. EXAM: PORTABLE ABDOMEN - 1 VIEW COMPARISON:  Radiograph 04/29/2019 FINDINGS: Tip and side port of the enteric tube below the diaphragm in the stomach. Persistent but decreasing gaseous distention of colon in the central abdomen. Prominent air-filled small bowel loops in the left abdomen. Midline skin staples in place. IMPRESSION: 1. Tip and side port of the enteric tube below the diaphragm in the stomach. 2. Persistent but decreasing gaseous distention of colon in  the central abdomen. Electronically Signed   By: Keith Rake M.D.   On: 05/01/2019 03:05    Anti-infectives: Anti-infectives (From admission, onward)   Start     Dose/Rate Route Frequency Ordered Stop   04/29/19 1600  cefTRIAXone (ROCEPHIN) 2 g in sodium chloride 0.9 % 100 mL IVPB  Status:  Discontinued     2 g 200 mL/hr over 30 Minutes Intravenous Every 24 hours 04/29/19 1557 05/01/19 0900   04/29/19 1600  metroNIDAZOLE (FLAGYL) IVPB 500 mg  Status:  Discontinued     500 mg 100 mL/hr over 60 Minutes Intravenous Every 8 hours 04/29/19 1557 05/01/19 1341   04/27/19 1700  piperacillin-tazobactam (ZOSYN) IVPB 3.375 g  Status:  Discontinued     3.375 g 12.5 mL/hr over 240 Minutes Intravenous Every 8 hours 04/27/19 1640 04/29/19 1557   04/25/19 2200  vancomycin (VANCOCIN) 1,250 mg in sodium chloride 0.9 % 250 mL IVPB  Status:  Discontinued     1,250 mg 166.7 mL/hr over 90 Minutes Intravenous Every 24 hours 04/25/19 0158 04/27/19 1640   04/25/19 2100  ceFEPIme (MAXIPIME) 2 g in sodium chloride 0.9 % 100 mL IVPB  Status:  Discontinued     2 g 200 mL/hr over 30 Minutes Intravenous Every 24 hours 04/24/19 2213 04/25/19 0900   04/25/19 1400  ceFEPIme (MAXIPIME) 2 g in sodium chloride 0.9 % 100 mL IVPB  Status:  Discontinued     2 g 200 mL/hr over 30 Minutes Intravenous Every 12 hours 04/25/19 1341 04/27/19 1640   04/25/19 1000  cefTRIAXone (ROCEPHIN) 2  g in sodium chloride 0.9 % 100 mL IVPB  Status:  Discontinued     2 g 200 mL/hr over 30 Minutes Intravenous Every 24 hours 04/25/19 0841 04/25/19 1335   04/25/19 0015  metroNIDAZOLE (FLAGYL) IVPB 500 mg  Status:  Discontinued     500 mg 100 mL/hr over 60 Minutes Intravenous Every 8 hours 04/25/19 0006 04/27/19 1640   04/25/19 0000  vancomycin (VANCOCIN) 1,500 mg in sodium chloride 0.9 % 500 mL IVPB     1,500 mg 250 mL/hr over 120 Minutes Intravenous  Once 04/24/19 2355 04/25/19 0248   04/24/19 2215  ceFEPIme (MAXIPIME) 2 g in sodium  chloride 0.9 % 100 mL IVPB  Status:  Discontinued     2 g 200 mL/hr over 30 Minutes Intravenous Every 24 hours 04/24/19 2211 04/24/19 2213   04/24/19 2100  ceFEPIme (MAXIPIME) 2 g in sodium chloride 0.9 % 100 mL IVPB     2 g 200 mL/hr over 30 Minutes Intravenous  Once 04/24/19 2046 04/24/19 2154   04/24/19 2100  metroNIDAZOLE (FLAGYL) IVPB 500 mg     500 mg 100 mL/hr over 60 Minutes Intravenous  Once 04/24/19 2046 04/24/19 2223      Assessment/Plan: A fib, onEliquis- on hold Alzheimer's dementia HTN HxT1N0colon cancer of hepatic flexure, sigmoid stricture S/P robotic LOA, right colectomy, sigmoidectomy 11/26/17 CKD  Intra-abdominal abscess - probably secondary to SB perforation S/p exploratory laparotomy/ LOA/ SB resection with anastomosis 10/13 - [place] - POD#5 - con't FLD,  - continue TPN - mobilize  FEN -NPO/ PICC/ TNA until bowel function returns  VTE -will restart Eliquis       LOS: 8 days    Ralene Ok 05/02/2019

## 2019-05-03 LAB — COMPREHENSIVE METABOLIC PANEL
ALT: 16 U/L (ref 0–44)
AST: 29 U/L (ref 15–41)
Albumin: 1.7 g/dL — ABNORMAL LOW (ref 3.5–5.0)
Alkaline Phosphatase: 87 U/L (ref 38–126)
Anion gap: 9 (ref 5–15)
BUN: 15 mg/dL (ref 8–23)
CO2: 24 mmol/L (ref 22–32)
Calcium: 7.3 mg/dL — ABNORMAL LOW (ref 8.9–10.3)
Chloride: 101 mmol/L (ref 98–111)
Creatinine, Ser: 0.97 mg/dL (ref 0.61–1.24)
GFR calc Af Amer: >60 mL/min (ref >60)
GFR calc non Af Amer: >60 mL/min (ref >60)
Glucose, Bld: 150 mg/dL — ABNORMAL HIGH (ref 70–99)
Potassium: 4.6 mmol/L (ref 3.5–5.1)
Sodium: 134 mmol/L — ABNORMAL LOW (ref 135–145)
Total Bilirubin: 0.6 mg/dL (ref 0.3–1.2)
Total Protein: 5 g/dL — ABNORMAL LOW (ref 6.5–8.1)

## 2019-05-03 LAB — GLUCOSE, CAPILLARY
Glucose-Capillary: 159 mg/dL — ABNORMAL HIGH (ref 70–99)
Glucose-Capillary: 159 mg/dL — ABNORMAL HIGH (ref 70–99)
Glucose-Capillary: 189 mg/dL — ABNORMAL HIGH (ref 70–99)
Glucose-Capillary: 182 mg/dL — ABNORMAL HIGH (ref 70–99)
Glucose-Capillary: 187 mg/dL — ABNORMAL HIGH (ref 70–99)

## 2019-05-03 LAB — CBC WITH DIFFERENTIAL/PLATELET
Abs Immature Granulocytes: 0 10*3/uL (ref 0.00–0.07)
Basophils Absolute: 0 10*3/uL (ref 0.0–0.1)
Basophils Relative: 0 %
Eosinophils Absolute: 0.3 10*3/uL (ref 0.0–0.5)
Eosinophils Relative: 2 %
HCT: 32.9 % — ABNORMAL LOW (ref 39.0–52.0)
Hemoglobin: 11 g/dL — ABNORMAL LOW (ref 13.0–17.0)
Lymphocytes Relative: 6 %
Lymphs Abs: 0.8 10*3/uL (ref 0.7–4.0)
MCH: 31.4 pg (ref 26.0–34.0)
MCHC: 33.4 g/dL (ref 30.0–36.0)
MCV: 94 fL (ref 80.0–100.0)
Monocytes Absolute: 0.8 10*3/uL (ref 0.1–1.0)
Monocytes Relative: 6 %
Neutro Abs: 11.6 10*3/uL — ABNORMAL HIGH (ref 1.7–7.7)
Neutrophils Relative %: 86 %
Platelets: 379 10*3/uL (ref 150–400)
RBC: 3.5 MIL/uL — ABNORMAL LOW (ref 4.22–5.81)
RDW: 14.5 % (ref 11.5–15.5)
WBC: 13.5 10*3/uL — ABNORMAL HIGH (ref 4.0–10.5)
nRBC: 0 % (ref 0.0–0.2)
nRBC: 1 /100 WBC — ABNORMAL HIGH

## 2019-05-03 LAB — PHOSPHORUS: Phosphorus: 3.7 mg/dL (ref 2.5–4.6)

## 2019-05-03 LAB — TRIGLYCERIDES: Triglycerides: 55 mg/dL (ref <150)

## 2019-05-03 LAB — MAGNESIUM: Magnesium: 1.8 mg/dL (ref 1.7–2.4)

## 2019-05-03 LAB — PREALBUMIN: Prealbumin: 9 mg/dL — ABNORMAL LOW (ref 18–38)

## 2019-05-03 MED ORDER — TRAMADOL HCL 50 MG PO TABS: 50.0000 mg | ORAL_TABLET | Freq: Four times a day (QID) | ORAL | Status: DC | PRN

## 2019-05-03 MED ORDER — GLUCERNA SHAKE PO LIQD
237.0000 mL | Freq: Two times a day (BID) | ORAL | Status: DC
  Administered 2019-05-03: 09:00:00 237 mL via ORAL

## 2019-05-03 MED ORDER — MAGNESIUM SULFATE 2 GM/50ML IV SOLN
2.0000 g | Freq: Once | INTRAVENOUS | Status: AC
  Administered 2019-05-03: 2 g via INTRAVENOUS
  Filled 2019-05-03: qty 50

## 2019-05-03 MED ORDER — TRAVASOL 10 % IV SOLN
INTRAVENOUS | Status: DC
  Filled 2019-05-03: qty 1094.4

## 2019-05-03 MED ORDER — ENSURE ENLIVE PO LIQD
237.0000 mL | Freq: Three times a day (TID) | ORAL | Status: DC
  Administered 2019-05-03: 237 mL via ORAL

## 2019-05-03 MED ORDER — MORPHINE SULFATE (PF) 2 MG/ML IV SOLN: 1.0000 mg | INTRAVENOUS | Status: DC | PRN

## 2019-05-03 MED ORDER — ACETAMINOPHEN 500 MG PO TABS
1000.0000 mg | ORAL_TABLET | Freq: Four times a day (QID) | ORAL | Status: DC
  Administered 2019-05-03 – 2019-05-11 (×25): 1000 mg via ORAL
  Filled 2019-05-03 (×29): qty 2

## 2019-05-03 MED ORDER — TRAVASOL 10 % IV SOLN
INTRAVENOUS | Status: AC
  Administered 2019-05-03: 18:00:00 via INTRAVENOUS
  Filled 2019-05-03: qty 547.2

## 2019-05-03 MED ORDER — METHOCARBAMOL 500 MG PO TABS: 500.0000 mg | ORAL_TABLET | Freq: Three times a day (TID) | ORAL | Status: DC | PRN

## 2019-05-03 MED ORDER — ENSURE ENLIVE PO LIQD
237.0000 mL | Freq: Four times a day (QID) | ORAL | Status: DC
  Administered 2019-05-03 – 2019-05-11 (×21): 237 mL via ORAL

## 2019-05-03 MED ORDER — PANTOPRAZOLE SODIUM 40 MG PO TBEC
40.0000 mg | DELAYED_RELEASE_TABLET | Freq: Every day | ORAL | Status: DC
  Administered 2019-05-05 – 2019-05-11 (×7): 40 mg via ORAL
  Filled 2019-05-03 (×8): qty 1

## 2019-05-03 NOTE — Progress Notes (Signed)
Nutrition Follow-up  DOCUMENTATION CODES:   Not applicable  INTERVENTION:   -D/C Glucerna    Add Ensure Enlive po TID, each supplement provides 350 kcal and 20 grams of protein  Magic cup BID with meals, each supplement provides 290 kcal and 9 grams of protein  Continue TPN until PO increases  NUTRITION DIAGNOSIS:   Increased nutrient needs related to post-op healing as evidenced by estimated needs.  Ongoing  GOAL:   Patient will meet greater than or equal to 90% of their needs   Addressed via TPN  MONITOR:   Diet advancement, Labs, Skin, Weight trends, I & O's  REASON FOR ASSESSMENT:   Consult New TPN/TNA  ASSESSMENT:   Patient with PMH significant for DM, GERD, HTN, Alzheimer's dementia, s/p colectomy in May 2019, and prostate cancer. Recently admitted with intra-abdominal abscess due to perforated viscus, discharged home on antibiotics. Presents this admission with worsening abdominal pain related to pneumoperitoneum of unclear etiology.   10/11-s/p drainage LLQ abscess 10/13- s/p ex lap, lysis of adhesions, small bowel resection  Pt feeling better today. Appetite slow to progress. Consumed two puddings this am without complication. Has not received Glucerna. TPN to be cut in half at 40 ml/hr to provide 55 g protein and 1084 kcal. Will switch Glucerna to Ensure to provide more kcal/protein. Recommend continuing TPN until PO intake increases.   RD placed calorie count envelope on the patient's door. Nursing to document percent consumed for each item on the patient's meal tray and supplement and keep in envelope.   Admission weight: 72.1 kg  Current weight: 82 kg  I/O: +4,420 ml since admit UOP: 1,825 ml x 24 hrs    Medications: SS novolog Labs: Na 134 (L) CBG 145-192   Diet Order:   Diet Order            DIET SOFT Room service appropriate? Yes; Fluid consistency: Thin  Diet effective now              EDUCATION NEEDS:   Education needs have  been addressed  Skin:  Skin Assessment: Skin Integrity Issues: Skin Integrity Issues:: Incisions Incisions: abdomen  Last BM:  10/18  Height:   Ht Readings from Last 1 Encounters:  05/03/19 5\' 10"  (1.778 m)    Weight:   Wt Readings from Last 1 Encounters:  05/03/19 82 kg    Ideal Body Weight:  75.5 kg  BMI:  Body mass index is 25.94 kg/m.  Estimated Nutritional Needs:   Kcal:  2100-2300 kcal  Protein:  105-120 grams  Fluid:  >/= 2.1 L/day  Mariana Single RD, LDN Clinical Nutrition Pager # - [phone]

## 2019-05-03 NOTE — Progress Notes (Addendum)
PHARMACY - ADULT TOTAL PARENTERAL NUTRITION CONSULT NOTE  Pharmacy Consult:  TPN Indication: Prolonged Ileus   Patient Measurements: Height: 5\' 10"  (177.8 cm) Weight: 180 lb 12.8 oz (82 kg) IBW/kg (Calculated) : 73 TPN AdjBW (KG): 82 Body mass index is 25.94 kg/m.  [name] in Sept, 72 kg on admit  Assessment:  53 YOM with history T1N0 adenocarcinoma s/p LoA, right colectomy, sigmoidectomy on 11/26/2017 admitted on 04/24/19 with intra-abdominal abscesses secondary to SB perforation/infection.  He is s/p ex-lap with extensive LoA and SBR with primary anastomosis on 04/27/19. Pharmacy consulted to manage TPN for prolonged ileus.  Although patient has had several admissions with abdominal pain and surgery, he denies losing weight and reduced PO intake.  He eats 2-3 meals a day, at [employer] and [employer].  No risk for refeeding based on patient's report; however, trending down electrolytes made it possible that he is refeeding.  GI: hx GERD.  Pre-albumin 5.8>>9, LBM 10/16, NG removed - PPI daily Endo: DM on metformin PTA - CBGs 156-192 Insulin requirements in the past 24 hours: 17 units SSI + 10 units in TPN  Lytes: K 4.6 (goal >/= 4 for ileus), Phos 3.7, Mag 1.8 (goal >/= 2 for ileus), Na down 134 others WNL Renal: SCr 0.97, BUN WNL - UOP 0.9 ml/kg/hr Pulm: stable on RA Cards: Afib/HTN - intermittent high BP/HR - IV metoprolol, off Eliquis Hepatobil: LFTs / tbili / TG WNL Neuro: Alzheimer - PRN morphine ID: Zosyn 10/13 >>10/15; CTX/Flagyl 10/15 >>10/17 for IAI/SB perf - afebrile, WBC up 13.5  TPN Access: PICC placed 04/28/19 TPN start date: 04/28/19  Nutritional Goals (RD rec 04/28/19): 2100-2300 kCal, 105-120gm protein per day  Current Nutrition: TPN Started on FLD - no intake documented (Fair appetite charted) / discussed with RN - will document going forward today  Plan:  Reduce TPN to half of goal rate at 40 ml/hr per surgery request.  This TPN provides 55 gms of  protein, 158.4 grams of dextrose and 33 grams of lipids which give total of 1084 kcals. This meets ~50% of patient protein and calorie goals. Electrolytes in TPN: slight decrease in K and Phos as rising; maintain increased Mag, adjust [id number] 1:2. Daily multivitamin and trace elements in TPN Continue moderate Q4H. Remove insulin from TPN with reduction of 50%.  F/U AM labs, CBGs, and toleration of enteral diet  Magnesium 2g IV x1  Sloan Leiter, PharmD, BCPS, BCCCP Clinical Pharmacist Clinical phone 05/03/2019 until [phone] Please refer to [facility] for [facility] [place] numbers 05/03/2019, 8:29 AM

## 2019-05-03 NOTE — Progress Notes (Addendum)
Patient ID: Calvin Burnett, male   DOB: April 23, 1934, 85 y.o.   MRN: [id number]    6 Days Post-Op  Subjective: Denies abdominal pain.  States he is eating some of his full liquids (unclear how much he is really eating though.)  BM yesterday.  No nausea.  Got up to the chair yesterday.  Objective: Vital signs in last 24 hours: Temp:  [97.8 F (36.6 C)-98.6 F (37 C)] 98 F (36.7 C) (10/19 0402) Pulse Rate:  [86-98] 89 (10/19 0840) Resp:  [18-22] 22 (10/19 0500) BP: (112-137)/(78-91) 126/91 (10/19 0840) SpO2:  [94 %-99 %] 96 % (10/19 0500) Weight:  [82 kg] 82 kg (10/19 0402) Last BM Date: 05/02/19  Intake/Output from previous day: 10/18 0701 - 10/19 0700 In: 728.6 [I.V.:728.6] Out: 1825 [Urine:1825] Intake/Output this shift: No intake/output data recorded.  PE: Abd: soft, but still a little bloated, midline incision is c/d/i with staples and honeycomb dressing.  No erythema.  +BS, appropriately tender to palpation  Lab Results:  Recent Labs    05/02/19 0439 05/03/19 0443  WBC 10.9* 13.5*  HGB 10.3* 11.0*  HCT 30.0* 32.9*  PLT 386 379   BMET Recent Labs    05/02/19 0439 05/03/19 0443  NA 137 134*  K 4.3 4.6  CL 105 101  CO2 24 24  GLUCOSE 123* 150*  BUN 13 15  CREATININE 0.85 0.97  CALCIUM 7.3* 7.3*   PT/INR No results for input(s): LABPROT, INR in the last 72 hours. CMP     Component Value Date/Time   NA 134 (L) 05/03/2019 0443   K 4.6 05/03/2019 0443   CL 101 05/03/2019 0443   CO2 24 05/03/2019 0443   GLUCOSE 150 (H) 05/03/2019 0443   BUN 15 05/03/2019 0443   CREATININE 0.97 05/03/2019 0443   CALCIUM 7.3 (L) 05/03/2019 0443   PROT 5.0 (L) 05/03/2019 0443   ALBUMIN 1.7 (L) 05/03/2019 0443   AST 29 05/03/2019 0443   ALT 16 05/03/2019 0443   ALKPHOS 87 05/03/2019 0443   BILITOT 0.6 05/03/2019 0443   GFRNONAA >60 05/03/2019 0443   GFRAA >60 05/03/2019 0443   Lipase     Component Value Date/Time   LIPASE 42 04/24/2019 1848        Studies/Results: No results found.  Anti-infectives: Anti-infectives (From admission, onward)   Start     Dose/Rate Route Frequency Ordered Stop   04/29/19 1600  cefTRIAXone (ROCEPHIN) 2 g in sodium chloride 0.9 % 100 mL IVPB  Status:  Discontinued     2 g 200 mL/hr over 30 Minutes Intravenous Every 24 hours 04/29/19 1557 05/01/19 0900   04/29/19 1600  metroNIDAZOLE (FLAGYL) IVPB 500 mg  Status:  Discontinued     500 mg 100 mL/hr over 60 Minutes Intravenous Every 8 hours 04/29/19 1557 05/01/19 1341   04/27/19 1700  piperacillin-tazobactam (ZOSYN) IVPB 3.375 g  Status:  Discontinued     3.375 g 12.5 mL/hr over 240 Minutes Intravenous Every 8 hours 04/27/19 1640 04/29/19 1557   04/25/19 2200  vancomycin (VANCOCIN) 1,250 mg in sodium chloride 0.9 % 250 mL IVPB  Status:  Discontinued     1,250 mg 166.7 mL/hr over 90 Minutes Intravenous Every 24 hours 04/25/19 0158 04/27/19 1640   04/25/19 2100  ceFEPIme (MAXIPIME) 2 g in sodium chloride 0.9 % 100 mL IVPB  Status:  Discontinued     2 g 200 mL/hr over 30 Minutes Intravenous Every 24 hours 04/24/19 2213 04/25/19 0900   04/25/19  1400  ceFEPIme (MAXIPIME) 2 g in sodium chloride 0.9 % 100 mL IVPB  Status:  Discontinued     2 g 200 mL/hr over 30 Minutes Intravenous Every 12 hours 04/25/19 1341 04/27/19 1640   04/25/19 1000  cefTRIAXone (ROCEPHIN) 2 g in sodium chloride 0.9 % 100 mL IVPB  Status:  Discontinued     2 g 200 mL/hr over 30 Minutes Intravenous Every 24 hours 04/25/19 0841 04/25/19 1335   04/25/19 0015  metroNIDAZOLE (FLAGYL) IVPB 500 mg  Status:  Discontinued     500 mg 100 mL/hr over 60 Minutes Intravenous Every 8 hours 04/25/19 0006 04/27/19 1640   04/25/19 0000  vancomycin (VANCOCIN) 1,500 mg in sodium chloride 0.9 % 500 mL IVPB     1,500 mg 250 mL/hr over 120 Minutes Intravenous  Once 04/24/19 2355 04/25/19 0248   04/24/19 2215  ceFEPIme (MAXIPIME) 2 g in sodium chloride 0.9 % 100 mL IVPB  Status:  Discontinued     2 g 200  mL/hr over 30 Minutes Intravenous Every 24 hours 04/24/19 2211 04/24/19 2213   04/24/19 2100  ceFEPIme (MAXIPIME) 2 g in sodium chloride 0.9 % 100 mL IVPB     2 g 200 mL/hr over 30 Minutes Intravenous  Once 04/24/19 2046 04/24/19 2154   04/24/19 2100  metroNIDAZOLE (FLAGYL) IVPB 500 mg     500 mg 100 mL/hr over 60 Minutes Intravenous  Once 04/24/19 2046 04/24/19 2223       Assessment/Plan A fib, onEliquis Alzheimer's dementia HTN HxT1N0colon cancer of hepatic flexure, sigmoid stricture S/P robotic LOA, right colectomy, sigmoidectomy 11/26/17 CKD  POD 6, S/p exploratory laparotomy/ LOA/ SB resection with anastomosis 10/13 - Tsuei -adv to soft diet and add glucerna for additional supplementation.  Will wean TNA to half rate today to start this process, but doubt he is fully ready to come off of this today -mobilize more. TID ambulation.  PT ordered -transition IV meds to po meds for pain, protonix, etc -WBC slightly up today to 13 from 10.  Cont to follow.  FEN -soft diet, glucerna, half rate TNA today VTE -Eliquis ID - none currently    LOS: 9 days    [name] , [facility] Surgery 05/03/2019, 8:55 AM Please see Amion for pager number during day hours 7:00am-4:30pm

## 2019-05-03 NOTE — Progress Notes (Signed)
Physical Therapy Treatment Patient Details Name: JASMOND BELZER MRN: [id number] DOB: [birth year] Today's Date: 05/03/2019    History of Present Illness Calvin Burnett is a 85 y.o. male with PMH: a-fib, alxheimer's dementia, diabetes, GERD, hypertension and prostate cancer who was recently admitted with intra-abdominal abscess due to perforated viscus from April 13, 2009 discharged home on antibiotics. PSH: history of right-sided colectomy and sigmoidoscopy in May 2019. Pt admitted for worsening abd pain. Pt underwent abdominal ex lap on10/13.  Abd xray 04/29/19: concerning for ileusor distal small bowel obstruction.    PT Comments    Patient seen for mobility progression. Pt is making progress toward PT goals and tolerated increased mobility well. Son present and supportive throughout session.  Current plan remains appropriate.   Follow Up Recommendations  Home health PT;Supervision/Assistance - 24 hour     Equipment Recommendations  Rolling walker with 5" wheels    Recommendations for Other Services       Precautions / Restrictions Precautions Precautions: Fall Restrictions Weight Bearing Restrictions: No    Mobility  Bed Mobility Overal bed mobility: Needs Assistance Bed Mobility: Supine to Sit     Supine to sit: Min guard     General bed mobility comments: min guard for safety; use of rail and HOB elevated  Transfers Overall transfer level: Needs assistance Equipment used: Rolling walker (2 wheeled) Transfers: Sit to/from Stand Sit to Stand: Min guard         General transfer comment: cues for safe hand placement  Ambulation/Gait Ambulation/Gait assistance: Min guard Gait Distance (Feet): 300 Feet Assistive device: Rolling walker (2 wheeled) Gait Pattern/deviations: Decreased stride length;Step-through pattern Gait velocity: decreased   General Gait Details: decreased cadence; grossly steady gait with slight difficulty with use of RW when turning; no  LOB    Stairs             Wheelchair Mobility    Modified Rankin (Stroke Patients Only)       Balance Overall balance assessment: Needs assistance Sitting-balance support: Feet supported Sitting balance-Leahy Scale: Good     Standing balance support: Bilateral upper extremity supported Standing balance-Leahy Scale: Poor                              Cognition Arousal/Alertness: Awake/alert Behavior During Therapy: Flat affect Overall Cognitive Status: History of cognitive impairments - at baseline                                 General Comments: Per chart pt has baseline alzheimers dementia      Exercises      General Comments General comments (skin integrity, edema, etc.): HR up to 123 bpm, RR up to 33, and SpO2 96% on RA while ambulating       Pertinent Vitals/Pain Pain Assessment: Faces Faces Pain Scale: Hurts a little bit Pain Location: abdomen with movement Pain Descriptors / Indicators: Guarding;Discomfort Pain Intervention(s): Limited activity within patient's tolerance;Monitored during session;Repositioned    Home Living                      Prior Function            PT Goals (current goals can now be found in the care plan section) Acute Rehab PT Goals Patient Stated Goal: to get better and go home Progress towards PT goals: Progressing toward  goals    Frequency    Min 3X/week      PT Plan Current plan remains appropriate    Co-evaluation              AM-PAC PT "6 Clicks" Mobility   Outcome Measure  Help needed turning from your back to your side while in a flat bed without using bedrails?: A Little Help needed moving from lying on your back to sitting on the side of a flat bed without using bedrails?: A Little Help needed moving to and from a bed to a chair (including a wheelchair)?: A Little Help needed standing up from a chair using your arms (e.g., wheelchair or bedside chair)?: A  Little Help needed to walk in hospital room?: A Little Help needed climbing 3-5 steps with a railing? : A Lot 6 Click Score: 17    End of Session Equipment Utilized During Treatment: Gait belt Activity Tolerance: Patient tolerated treatment well Patient left: in chair;with call bell/phone within reach;with family/visitor present Nurse Communication: Mobility status(need for clean bed linens) PT Visit Diagnosis: Muscle weakness (generalized) (M62.81);Difficulty in walking, not elsewhere classified (R26.2)     Time: 1349-1410 PT Time Calculation (min) (ACUTE ONLY): 21 min  Charges:  $Gait Training: 8-22 mins                     Earney Navy, PTA Acute Rehabilitation Services Pager: [phone] Office: [phone]     Darliss Cheney 05/03/2019, 3:15 PM

## 2019-05-03 NOTE — Care Management Important Message (Signed)
Important Message  Patient Details  Name: Calvin Burnett MRN: [id number] Date of Birth: [birth year]   Medicare Important Message Given:  Yes     [name] 05/03/2019, 1:29 PM

## 2019-05-03 NOTE — Progress Notes (Signed)
[name] PROGRESS NOTE  Calvin Burnett [id number] DOB: 03-18-1934 DOA: 04/24/2019 PCP: Calvin Olp, MD  Brief summary   Calvin Burnett is a 85 y.o. male with medical history significant of diabetes, GERD, hypertension and prostate cancer who was recently admitted with intra-abdominal abscess due to perforated viscus from April 13, 2009 discharged home on antibiotics, history of A. fib, currently on Eliquis.  Hypertension GERD and Alzheimer's dementia, history of right-sided colectomy and sigmoidoscopy in May 2019, pneumoperitoneum who presented again with abdominal pain as well as fever.  Patient had temperature up to 102.  He was on Eliquis and no surgery was done previous admission.  His abscess was deemed not drainable at the time.  He was found to have worsening intra-abdominal abscess.  Patient has not been on antibiotics in the last few days.   Upon arrival to ED, his temperature was 100.5 blood pressure 100/67 pulse 98 respiratory 24 oxygen sat 95% on room air.  Sodium 135 potassium 3.3 chloride 103 CO2 22 glucose 142.  Creatinine 1.27 calcium 7.2.  Albumin 2.0.  White count 17.9 platelet lites 406 and hemoglobin 12.8.  COVID-19 screen is negative.  CT abdomen pelvis shows interval development of multiple intra-abdominal abscesses measure approximately 6.2 x 5.2 x 7.1 cm.  Multiple thick-walled loops of small bowel in the mid abdomen small volume pneumoperitoneum which is decreased from previous small bilateral pleural effusions with bladder wall thickening.    He was admitted under [place].  Surgery was consulted.  He underwent intra-abdominal drain placement by IR on 04/25/2019.  He was continued on broad-spectrum IV antibiotics.  Underwent exploratory laparotomy on 04/27/2019.  Patient's initial blood pressure which were drawn on 04/24/2019 turned out to be positive with Streptococcus constellatus on 04/28/2019.  Patient's antibiotics were de-escalated to Rocephin 2 g IV and  Flagyl were continued.  Transthoracic echo obtained on 04/29/2019 did not show any signs of endocarditis.  Discussed with ID and per their recommendation, spoke to cardiology for TEE next week.  Assessment/Plan:  Intra-abdominal abscesses/gram-positive bacteremia: recurrent infections. Ct abd: Interval development of multiple intra-abdominal abscesses as detailed above measuring up to approximately 6.2 x 5.2 by 7.1 cm. status post intra-abdominal drainage placed by IR on 04/25/2019.  He then underwent exploratory laparotomy on 04/27/2019, -Blood culture from 04/24/2019+ with Streptococcus constellatus, antibiotics switched to IV Rocephin 2 g daily along with Flagyl which I will continue.  Repeat blood culture from 04/30/2019 are negative thus far. Transthoracic echo negative for any signs of endocarditis.  I consulted ID and spoke to Dr. Graylon Good personally.  She recommended proceeding with TEE.  I called cardiology Master on 04/30/2019 and spoke to [place] who reassured her that patient will be on list for TEE.    Diabetes mellitus type II:  Blood sugar control.  Patient is getting insulin through his TPN.  Dementia: Stable.  He is alert and oriented.  Chronic kidney disease stage III: Also at baseline.  Continue monitoring  Paroxysmal atrial fibrillation: Rate is controlled.  In sinus rhythm.  Eliquis was resumed on 05/02/2019.  Continue that.  Hypertension:  Blood pressure very well controlled.  Continue current regimen.  DVT prophylaxis: Eliquis Code Status: full Family Communication: Plan of care discussed with patient.  He verbalized understanding.  No questions asked. Disposition Plan: TBD. Per general surgery.  Consultants:  Surgery  IR  Procedures:  Intra-abdominal drain placement on 04/25/2019  Exploratory laparotomy on 04/27/2019  Antibiotics: Anti-infectives (From admission, onward)   Start  Dose/Rate Route Frequency Ordered Stop   04/29/19 1600  cefTRIAXone  (ROCEPHIN) 2 g in sodium chloride 0.9 % 100 mL IVPB  Status:  Discontinued     2 g 200 mL/hr over 30 Minutes Intravenous Every 24 hours 04/29/19 1557 05/01/19 0900   04/29/19 1600  metroNIDAZOLE (FLAGYL) IVPB 500 mg  Status:  Discontinued     500 mg 100 mL/hr over 60 Minutes Intravenous Every 8 hours 04/29/19 1557 05/01/19 1341   04/27/19 1700  piperacillin-tazobactam (ZOSYN) IVPB 3.375 g  Status:  Discontinued     3.375 g 12.5 mL/hr over 240 Minutes Intravenous Every 8 hours 04/27/19 1640 04/29/19 1557   04/25/19 2200  vancomycin (VANCOCIN) 1,250 mg in sodium chloride 0.9 % 250 mL IVPB  Status:  Discontinued     1,250 mg 166.7 mL/hr over 90 Minutes Intravenous Every 24 hours 04/25/19 0158 04/27/19 1640   04/25/19 2100  ceFEPIme (MAXIPIME) 2 g in sodium chloride 0.9 % 100 mL IVPB  Status:  Discontinued     2 g 200 mL/hr over 30 Minutes Intravenous Every 24 hours 04/24/19 2213 04/25/19 0900   04/25/19 1400  ceFEPIme (MAXIPIME) 2 g in sodium chloride 0.9 % 100 mL IVPB  Status:  Discontinued     2 g 200 mL/hr over 30 Minutes Intravenous Every 12 hours 04/25/19 1341 04/27/19 1640   04/25/19 1000  cefTRIAXone (ROCEPHIN) 2 g in sodium chloride 0.9 % 100 mL IVPB  Status:  Discontinued     2 g 200 mL/hr over 30 Minutes Intravenous Every 24 hours 04/25/19 0841 04/25/19 1335   04/25/19 0015  metroNIDAZOLE (FLAGYL) IVPB 500 mg  Status:  Discontinued     500 mg 100 mL/hr over 60 Minutes Intravenous Every 8 hours 04/25/19 0006 04/27/19 1640   04/25/19 0000  vancomycin (VANCOCIN) 1,500 mg in sodium chloride 0.9 % 500 mL IVPB     1,500 mg 250 mL/hr over 120 Minutes Intravenous  Once 04/24/19 2355 04/25/19 0248   04/24/19 2215  ceFEPIme (MAXIPIME) 2 g in sodium chloride 0.9 % 100 mL IVPB  Status:  Discontinued     2 g 200 mL/hr over 30 Minutes Intravenous Every 24 hours 04/24/19 2211 04/24/19 2213   04/24/19 2100  ceFEPIme (MAXIPIME) 2 g in sodium chloride 0.9 % 100 mL IVPB     2 g 200 mL/hr over 30  Minutes Intravenous  Once 04/24/19 2046 04/24/19 2154   04/24/19 2100  metroNIDAZOLE (FLAGYL) IVPB 500 mg     500 mg 100 mL/hr over 60 Minutes Intravenous  Once 04/24/19 2046 04/24/19 2223       (indicate start date, and stop date if known)  HPI/Subjective: Patient seen and examined.  Sitting in the chair.  Feeling and looking much better.  No complaints.  He states that he has been passing gas intermittently.  Objective: Vitals:   05/03/19 0500 05/03/19 0840  BP:  (!) 126/91  Pulse: 88 89  Resp: (!) 22   Temp:    SpO2: 96%     Intake/Output Summary (Last 24 hours) at 05/03/2019 0856 Last data filed at 05/03/2019 0600 Gross per 24 hour  Intake 728.56 ml  Output 1825 ml  Net -1096.44 ml   Filed Weights   04/26/19 0406 04/27/19 0500 05/03/19 0402  Weight: 83.1 kg 84.2 kg 82 kg    Exam:  General exam: Appears calm and comfortable  Respiratory system: Clear to auscultation. Respiratory effort normal. Cardiovascular system: S1 & S2 heard, RRR. No  JVD, murmurs, rubs, gallops or clicks. No pedal edema. Gastrointestinal system: Abdomen is nondistended, soft and generalized tenderness. No organomegaly or masses felt. Normal bowel sounds heard. Central nervous system: Alert and oriented. No focal neurological deficits. Extremities: Symmetric 5 x 5 power. Skin: No rashes, lesions or ulcers.  Psychiatry: Judgement and insight appear poor. Mood & affect appropriate.    Data Reviewed: Basic Metabolic Panel: Recent Labs  Lab 04/29/19 0500 04/30/19 0815 05/01/19 0536 05/02/19 0439 05/03/19 0443  NA 139 138 138 137 134*  K 3.9 3.5 4.0 4.3 4.6  CL 109 107 108 105 101  CO2 21* 24 25 24 24   GLUCOSE 257* 162* 109* 123* 150*  BUN 21 12 13 13 15   CREATININE 1.26* 0.95 0.83 0.85 0.97  CALCIUM 7.3* 7.1* 7.4* 7.3* 7.3*  MG 2.0 1.7 1.9 2.0 1.8  PHOS 1.7* 1.8* 2.6 3.1 3.7   Liver Function Tests: Recent Labs  Lab 04/28/19 0434 04/29/19 0500 05/03/19 0443  AST 33 21 29  ALT  25 17 16   ALKPHOS 90 68 87  BILITOT 1.3* 0.7 0.6  PROT 5.1* 4.6* 5.0*  ALBUMIN 2.0* 1.7* 1.7*   No results for input(s): LIPASE, AMYLASE in the last 168 hours. No results for input(s): AMMONIA in the last 168 hours. CBC: Recent Labs  Lab 04/29/19 0500 04/30/19 1014 05/01/19 0536 05/02/19 0439 05/03/19 0443  WBC 21.6* 14.0* 13.5* 10.9* 13.5*  NEUTROABS 18.2* 10.6* 9.2* 9.3* 11.6*  HGB 11.7* 10.6* 10.4* 10.3* 11.0*  HCT 34.8* 30.1* 30.6* 30.0* 32.9*  MCV 94.3 94.4 93.9 94.0 94.0  PLT 477* 405* 401* 386 379   Cardiac Enzymes: No results for input(s): CKTOTAL, CKMB, CKMBINDEX, TROPONINI in the last 168 hours. BNP (last 3 results) No results for input(s): BNP in the last 8760 hours.  ProBNP (last 3 results) Recent Labs    01/29/19 0852  PROBNP 165.0*    CBG: Recent Labs  Lab 05/02/19 1655 05/02/19 2026 05/02/19 2330 05/03/19 0409 05/03/19 0837  GLUCAP 157* 192* 156* 159* 159*    Recent Results (from the past 240 hour(s))  Urine culture     Status: Abnormal   Collection Time: 04/24/19  6:53 PM   Specimen: In/Out Cath Urine  Result Value Ref Range Status   Specimen Description IN/OUT CATH URINE  Final   Special Requests   Final    added 2248 Performed at [facility], [place] [address], [place], [place] [address]    Culture 20,000 COLONIES/mL YEAST (A)  Final   Report Status 04/26/2019 FINAL  Final  Culture, blood (routine x 2)     Status: Abnormal   Collection Time: 04/24/19  9:00 PM   Specimen: BLOOD  Result Value Ref Range Status   Specimen Description BLOOD RIGHT ANTECUBITAL  Final   Special Requests   Final    BOTTLES DRAWN AEROBIC AND ANAEROBIC Blood Culture adequate volume   Culture  Setup Time   Final    GRAM POSITIVE COCCI IN CHAINS IN PAIRS IN BOTH AEROBIC AND ANAEROBIC BOTTLES CRITICAL RESULT CALLED TO, READ BACK BY AND VERIFIED WITH: Salli Real ED:[id number] 04/26/2019 Mena Goes Performed at [facility], [place] [address], [place],  [place] [address]    Culture STREPTOCOCCUS CONSTELLATUS (A)  Final   Report Status 04/28/2019 FINAL  Final   Organism ID, Bacteria STREPTOCOCCUS CONSTELLATUS  Final      Susceptibility   Streptococcus constellatus - MIC*    PENICILLIN INTERMEDIATE Intermediate  CEFTRIAXONE 1 SENSITIVE Sensitive     ERYTHROMYCIN <=0.12 SENSITIVE Sensitive     LEVOFLOXACIN 0.5 SENSITIVE Sensitive     VANCOMYCIN 0.5 SENSITIVE Sensitive     * STREPTOCOCCUS CONSTELLATUS  Blood Culture ID Panel (Reflexed)     Status: Abnormal   Collection Time: 04/24/19  9:00 PM  Result Value Ref Range Status   Enterococcus species NOT DETECTED NOT DETECTED Final   Listeria monocytogenes NOT DETECTED NOT DETECTED Final   Staphylococcus species NOT DETECTED NOT DETECTED Final   Staphylococcus aureus (BCID) NOT DETECTED NOT DETECTED Final   Streptococcus species DETECTED (A) NOT DETECTED Final    Comment: Not Enterococcus species, Streptococcus agalactiae, Streptococcus pyogenes, or Streptococcus pneumoniae. CRITICAL RESULT CALLED TO, READ BACK BY AND VERIFIED WITH: G. ABBOTT,PHARMD ED:[id number] 04/26/2019 T. TYSOR    Streptococcus agalactiae NOT DETECTED NOT DETECTED Final   Streptococcus pneumoniae NOT DETECTED NOT DETECTED Final   Streptococcus pyogenes NOT DETECTED NOT DETECTED Final   Acinetobacter baumannii NOT DETECTED NOT DETECTED Final   Enterobacteriaceae species NOT DETECTED NOT DETECTED Final   Enterobacter cloacae complex NOT DETECTED NOT DETECTED Final   Escherichia coli NOT DETECTED NOT DETECTED Final   Klebsiella oxytoca NOT DETECTED NOT DETECTED Final   Klebsiella pneumoniae NOT DETECTED NOT DETECTED Final   Proteus species NOT DETECTED NOT DETECTED Final   Serratia marcescens NOT DETECTED NOT DETECTED Final   Haemophilus influenzae NOT DETECTED NOT DETECTED Final   Neisseria meningitidis NOT DETECTED NOT DETECTED Final   Pseudomonas aeruginosa NOT DETECTED NOT DETECTED Final   Candida albicans NOT DETECTED NOT  DETECTED Final   Candida glabrata NOT DETECTED NOT DETECTED Final   Candida krusei NOT DETECTED NOT DETECTED Final   Candida parapsilosis NOT DETECTED NOT DETECTED Final   Candida tropicalis NOT DETECTED NOT DETECTED Final    Comment: Performed at [facility], [place] [address] [place],  [address]  Culture, blood (routine x 2)     Status: Abnormal   Collection Time: 04/24/19  9:10 PM   Specimen: BLOOD LEFT FOREARM  Result Value Ref Range Status   Specimen Description BLOOD LEFT FOREARM  Final   Special Requests   Final    BOTTLES DRAWN AEROBIC AND ANAEROBIC Blood Culture adequate volume   Culture  Setup Time   Final    GRAM POSITIVE COCCI AEROBIC BOTTLE ONLY CRITICAL VALUE NOTED.  VALUE IS CONSISTENT WITH PREVIOUSLY REPORTED AND CALLED VALUE.    Culture (A)  Final    STREPTOCOCCUS CONSTELLATUS SUSCEPTIBILITIES PERFORMED ON PREVIOUS CULTURE WITHIN THE LAST 5 DAYS. Performed at [facility], [place] [address], [place],  [address]    Report Status 04/28/2019 FINAL  Final  SARS CORONAVIRUS 2 (TAT 6-24 HRS) Nasopharyngeal Nasopharyngeal Swab     Status: None   Collection Time: 04/25/19 12:11 AM   Specimen: Nasopharyngeal Swab  Result Value Ref Range Status   SARS Coronavirus 2 NEGATIVE NEGATIVE Final    Comment: (NOTE) SARS-CoV-2 target nucleic acids are NOT DETECTED. The SARS-CoV-2 RNA is generally detectable in upper and lower respiratory specimens during the acute phase of infection. Negative results do not preclude SARS-CoV-2 infection, do not rule out co-infections with other pathogens, and should not be used as the sole basis for treatment or other patient management decisions. Negative results must be combined with clinical observations, patient history, and epidemiological information. The expected result is Negative. Fact Sheet for Patients: SugarRoll.be Fact Sheet for Healthcare  Providers: https://www.woods-mathews.com/ This test is not  yet approved or cleared by the [place] and  has been authorized for detection and/or diagnosis of SARS-CoV-2 by FDA under an Emergency Use Authorization (EUA). This EUA will remain  in effect (meaning this test can be used) for the duration of the COVID-19 declaration under Section 56 4(b)(1) of the Act, 21 U.S.C. section 360bbb-3(b)(1), unless the authorization is terminated or revoked sooner. Performed at [facility], [place] [address] [place], [place] [address]   Aerobic/Anaerobic Culture (surgical/deep wound)     Status: Abnormal   Collection Time: 04/25/19  2:40 PM   Specimen: Abscess  Result Value Ref Range Status   Specimen Description ABSCESS  Final   Special Requests Normal  Final   Gram Stain   Final    MODERATE WBC PRESENT, PREDOMINANTLY PMN ABUNDANT GRAM NEGATIVE RODS MODERATE GRAM POSITIVE COCCI Performed at [facility], [address], [place], [place] [address]    Culture (A)  Final    MULTIPLE ORGANISMS PRESENT, NONE PREDOMINANT MIXED ANAEROBIC FLORA PRESENT.  CALL LAB IF FURTHER IID REQUIRED.    Report Status 04/27/2019 FINAL  Final  Surgical pcr screen     Status: None   Collection Time: 04/27/19  9:06 AM   Specimen: Nasal Mucosa; Nasal Swab  Result Value Ref Range Status   MRSA, PCR NEGATIVE NEGATIVE Final   Staphylococcus aureus NEGATIVE NEGATIVE Final    Comment: (NOTE) The Xpert SA Assay (FDA approved for NASAL specimens in patients 98 years of age and older), is one component of a comprehensive surveillance program. It is not intended to diagnose infection nor to guide or monitor treatment. Performed at [facility], [place] [address] [place], [place] [address]   Culture, blood (routine x 2)     Status: None (Preliminary result)   Collection Time: 04/30/19  4:32 PM   Specimen: BLOOD LEFT HAND  Result Value Ref Range Status   Specimen Description BLOOD  LEFT HAND  Final   Special Requests   Final    BOTTLES DRAWN AEROBIC AND ANAEROBIC Blood Culture adequate volume   Culture   Final    NO GROWTH 2 DAYS Performed at [facility], [place] [address] [place], [place] [address]    Report Status PENDING  Incomplete  Culture, blood (routine x 2)     Status: None (Preliminary result)   Collection Time: 04/30/19  4:37 PM   Specimen: BLOOD  Result Value Ref Range Status   Specimen Description BLOOD LEFT THUMB  Final   Special Requests   Final    BOTTLES DRAWN AEROBIC AND ANAEROBIC Blood Culture adequate volume   Culture   Final    NO GROWTH 2 DAYS Performed at [facility], [place] [address], [place], [place] [address]    Report Status PENDING  Incomplete     Studies: No results found.  Scheduled Meds: . acetaminophen  1,000 mg Oral Q6H  . amLODipine  5 mg Oral Daily  . apixaban  5 mg Oral BID  . Chlorhexidine Gluconate Cloth  6 each Topical Daily  . feeding supplement (GLUCERNA SHAKE)  237 mL Oral BID BM  . insulin aspart  0-15 Units Subcutaneous Q4H  . latanoprost  1 drop Both Eyes QHS  . metoprolol succinate  12.5 mg Oral Daily  . pantoprazole  40 mg Oral Daily   Continuous Infusions: . lactated ringers 10 mL/hr at 04/27/19 1016  . magnesium sulfate bolus IVPB    .  TPN ADULT (ION) 80 mL/hr at 05/03/19 0300  . TPN ADULT (ION)      Principal Problem:   Intra-abdominal abscess ([name]) Active Problems:   Essential hypertension   GERD (gastroesophageal reflux disease)   Type 2 diabetes mellitus ([name])   Prostate cancer ([name])   Hyperlipidemia associated with type 2 diabetes mellitus ([name])   CKD (chronic kidney disease), stage III ([name])   AF (paroxysmal atrial fibrillation) ([place])   Perforation bowel ([place])   Dementia ([place])   Pneumoperitoneum  Time spent: 27 minutes  Sarika Baldini  [name] If 7PM-7AM, please contact night-coverage at [email or web address], password [facility] 05/03/2019, 8:56 AM  LOS: 9 days

## 2019-05-04 ENCOUNTER — Inpatient Hospital Stay (HOSPITAL_COMMUNITY): Payer: PPO

## 2019-05-04 ENCOUNTER — Encounter (HOSPITAL_COMMUNITY): Admission: EM | Disposition: A | Discharge status: Home Health | Payer: Self-pay | Source: Home / Self Care

## 2019-05-04 ENCOUNTER — Ambulatory Visit: Payer: PPO | Admitting: Physician Assistant

## 2019-05-04 DIAGNOSIS — K651 Peritoneal abscess: Principal | ICD-10-CM

## 2019-05-04 LAB — BASIC METABOLIC PANEL
Anion gap: 7 (ref 5–15)
BUN: 20 mg/dL (ref 8–23)
CO2: 25 mmol/L (ref 22–32)
Calcium: 7.6 mg/dL — ABNORMAL LOW (ref 8.9–10.3)
Chloride: 101 mmol/L (ref 98–111)
Creatinine, Ser: 1.08 mg/dL (ref 0.61–1.24)
GFR calc Af Amer: >60 mL/min (ref >60)
GFR calc non Af Amer: >60 mL/min (ref >60)
Glucose, Bld: 127 mg/dL — ABNORMAL HIGH (ref 70–99)
Potassium: 4.8 mmol/L (ref 3.5–5.1)
Sodium: 133 mmol/L — ABNORMAL LOW (ref 135–145)

## 2019-05-04 LAB — CBC WITH DIFFERENTIAL/PLATELET
Abs Immature Granulocytes: 0.1 10*3/uL — ABNORMAL HIGH (ref 0.00–0.07)
Basophils Absolute: 0 10*3/uL (ref 0.0–0.1)
Basophils Relative: 0 %
Eosinophils Absolute: 0 10*3/uL (ref 0.0–0.5)
Eosinophils Relative: 0 %
HCT: 28.7 % — ABNORMAL LOW (ref 39.0–52.0)
Hemoglobin: 9.4 g/dL — ABNORMAL LOW (ref 13.0–17.0)
Lymphocytes Relative: 9 %
Lymphs Abs: 1.2 10*3/uL (ref 0.7–4.0)
MCH: 31 pg (ref 26.0–34.0)
MCHC: 32.8 g/dL (ref 30.0–36.0)
MCV: 94.7 fL (ref 80.0–100.0)
Monocytes Absolute: 0.5 10*3/uL (ref 0.1–1.0)
Monocytes Relative: 4 %
Myelocytes: 1 %
Neutro Abs: 11.5 10*3/uL — ABNORMAL HIGH (ref 1.7–7.7)
Neutrophils Relative %: 86 %
Platelets: 328 10*3/uL (ref 150–400)
RBC: 3.03 MIL/uL — ABNORMAL LOW (ref 4.22–5.81)
RDW: 14.6 % (ref 11.5–15.5)
WBC: 13.4 10*3/uL — ABNORMAL HIGH (ref 4.0–10.5)
nRBC: 0 % (ref 0.0–0.2)
nRBC: 0 /100 WBC

## 2019-05-04 LAB — GLUCOSE, CAPILLARY
Glucose-Capillary: 132 mg/dL — ABNORMAL HIGH (ref 70–99)
Glucose-Capillary: 140 mg/dL — ABNORMAL HIGH (ref 70–99)
Glucose-Capillary: 152 mg/dL — ABNORMAL HIGH (ref 70–99)
Glucose-Capillary: 154 mg/dL — ABNORMAL HIGH (ref 70–99)
Glucose-Capillary: 168 mg/dL — ABNORMAL HIGH (ref 70–99)
Glucose-Capillary: 170 mg/dL — ABNORMAL HIGH (ref 70–99)
Glucose-Capillary: 179 mg/dL — ABNORMAL HIGH (ref 70–99)
Glucose-Capillary: 202 mg/dL — ABNORMAL HIGH (ref 70–99)

## 2019-05-04 LAB — MAGNESIUM: Magnesium: 1.9 mg/dL (ref 1.7–2.4)

## 2019-05-04 LAB — PHOSPHORUS: Phosphorus: 3.9 mg/dL (ref 2.5–4.6)

## 2019-05-04 SURGERY — INVASIVE LAB ABORTED CASE

## 2019-05-04 MED ORDER — FUROSEMIDE 10 MG/ML IJ SOLN
20.0000 mg | Freq: Once | INTRAMUSCULAR | Status: AC
  Administered 2019-05-04: 20 mg via INTRAVENOUS
  Filled 2019-05-04: qty 2

## 2019-05-04 MED ORDER — TRAVASOL 10 % IV SOLN
INTRAVENOUS | Status: AC
  Administered 2019-05-04: 17:00:00 via INTRAVENOUS
  Filled 2019-05-04: qty 547.2

## 2019-05-04 MED ORDER — SODIUM CHLORIDE 0.9 % IV SOLN
INTRAVENOUS | Status: AC | PRN
  Administered 2019-05-04: 500 mL via INTRAMUSCULAR

## 2019-05-04 MED ORDER — METRONIDAZOLE IN NACL 5-0.79 MG/ML-% IV SOLN
500.0000 mg | Freq: Three times a day (TID) | INTRAVENOUS | Status: DC
  Administered 2019-05-04 – 2019-05-11 (×22): 500 mg via INTRAVENOUS
  Filled 2019-05-04 (×23): qty 100

## 2019-05-04 MED ORDER — SODIUM CHLORIDE 0.9 % IV SOLN
2.0000 g | INTRAVENOUS | Status: AC
  Administered 2019-05-04 – 2019-05-11 (×8): 2 g via INTRAVENOUS
  Filled 2019-05-04: qty 20
  Filled 2019-05-04 (×2): qty 2
  Filled 2019-05-04 (×2): qty 20
  Filled 2019-05-04 (×3): qty 2

## 2019-05-04 MED ORDER — DIPHENHYDRAMINE HCL 50 MG/ML IJ SOLN
INTRAMUSCULAR | Status: AC
  Filled 2019-05-04: qty 1

## 2019-05-04 MED ORDER — MAGNESIUM SULFATE IN D5W 1-5 GM/100ML-% IV SOLN
1.0000 g | Freq: Once | INTRAVENOUS | Status: AC
  Administered 2019-05-04: 1 g via INTRAVENOUS
  Filled 2019-05-04: qty 100

## 2019-05-04 MED ORDER — LIDOCAINE VISCOUS HCL 2 % MT SOLN
OROMUCOSAL | Status: DC | PRN
  Administered 2019-05-04: 20 mL via OROMUCOSAL

## 2019-05-04 MED ORDER — MIDAZOLAM HCL (PF) 5 MG/ML IJ SOLN
INTRAMUSCULAR | Status: AC
  Filled 2019-05-04: qty 2

## 2019-05-04 MED ORDER — LIDOCAINE VISCOUS HCL 2 % MT SOLN
OROMUCOSAL | Status: AC
  Filled 2019-05-04: qty 15

## 2019-05-04 MED ORDER — MIDAZOLAM HCL (PF) 5 MG/ML IJ SOLN
INTRAMUSCULAR | Status: DC | PRN
  Administered 2019-05-04 (×2): 2 mg via INTRAVENOUS

## 2019-05-04 MED ORDER — FENTANYL CITRATE (PF) 100 MCG/2ML IJ SOLN
INTRAMUSCULAR | Status: AC
  Filled 2019-05-04: qty 2

## 2019-05-04 MED ORDER — FENTANYL CITRATE (PF) 100 MCG/2ML IJ SOLN
INTRAMUSCULAR | Status: DC | PRN
  Administered 2019-05-04 (×2): 25 ug via INTRAVENOUS

## 2019-05-04 NOTE — Progress Notes (Signed)
Occupational Therapy Treatment Patient Details Name: Calvin Burnett MRN: [id number] DOB: [birth year] Today's Date: 05/04/2019    History of present illness Calvin Burnett is a 85 y.o. male with PMH: a-fib, alxheimer's dementia, diabetes, GERD, hypertension and prostate cancer who was recently admitted with intra-abdominal abscess due to perforated viscus from April 13, 2009 discharged home on antibiotics. PSH: history of right-sided colectomy and sigmoidoscopy in May 2019. Pt admitted for worsening abd pain. Pt underwent abdominal ex lap on10/13.  Abd xray 04/29/19: concerning for ileusor distal small bowel obstruction.   OT comments  Pt making steady progress towards OT goals this session. Session focus on toilet transfer and standing grooming at sink. Pt complete toilet transfer to<>from bathroom with supervision and RW; pt required MINA to sit<>stand from low toilet surface; MAX A for posterior pericare after BM d/t balance deficits. Pt able to complete standing grooming at sink this session with supervision for safety. DC plan remains appropriate. Will continue to follow acutely for OT needs.    Follow Up Recommendations  Home health OT;Supervision/Assistance - 24 hour    Equipment Recommendations  3 in 1 bedside commode    Recommendations for Other Services      Precautions / Restrictions Precautions Precautions: Fall       Mobility Bed Mobility Overal bed mobility: Needs Assistance Bed Mobility: Supine to Sit     Supine to sit: Supervision     General bed mobility comments: supervision for safety with use of bed features  Transfers Overall transfer level: Needs assistance Equipment used: Rolling walker (2 wheeled)   Sit to Stand: Supervision         General transfer comment: cues for safe hand placement    Balance Overall balance assessment: Needs assistance Sitting-balance support: Feet supported Sitting balance-Leahy Scale: Good     Standing balance  support: Single extremity supported;During functional activity Standing balance-Leahy Scale: Poor Standing balance comment: reliant on BUE support during functional mobility; reports he feels like he needs RW for ambulation; at least 1 extremity supported during standing grooming                           ADL either performed or assessed with clinical judgement   ADL       Grooming: Wash/dry hands;Standing;Supervision/safety           Upper Body Dressing : Sitting;Minimal assistance Upper Body Dressing Details (indicate cue type and reason): MIN A to don hospital gown in sitting     Toilet Transfer: RW;Ambulation;Min guard;Regular Toilet;Grab bars Toilet Transfer Details (indicate cue type and reason): MIN A to sit>stand from low regular toilet seat; pt noted to use grab bars during transfer Mobile and Hygiene: Maximal assistance;Sit to/from stand Toileting - Clothing Manipulation Details (indicate cue type and reason): MAX A for posterior pericare after BM     Functional mobility during ADLs: Supervision/safety;Rolling walker General ADL Comments: pt completed functional mobility with RW into hallway with supervision; pt with minor balance deficits observed requiring MAX A to complete standing posterior pericare after BM. Pt able to complete standing grooming at sink with session with supervision for safety with RW     Vision Baseline Vision/History: Wears glasses Wears Glasses: At all times     Perception     Praxis      Cognition Arousal/Alertness: Awake/alert Behavior During Therapy: [facility] for tasks assessed/performed Overall Cognitive Status: History of cognitive impairments - at baseline  General Comments: Per chart pt has baseline alzheimers dementia; pt more conversational this session        Exercises     Shoulder Instructions       General Comments HR MAX 120 after  functional mobility; O2 WNL after functional mobility    Pertinent Vitals/ Pain       Pain Assessment: No/denies pain  Home Living                                          Prior Functioning/Environment              Frequency  Min 2X/week        Progress Toward Goals  OT Goals(current goals can now be found in the care plan section)  Progress towards OT goals: Progressing toward goals  Acute Rehab OT Goals Patient Stated Goal: to get better and go home OT Goal Formulation: With patient Time For Goal Achievement: 05/12/19 Potential to Achieve Goals: Good  Plan Discharge plan remains appropriate    Co-evaluation    PT/OT/SLP Co-Evaluation/Treatment: Yes Reason for Co-Treatment: For patient/therapist safety;To address functional/ADL transfers   OT goals addressed during session: ADL's and self-care;Proper use of Adaptive equipment and DME      AM-PAC OT "6 Clicks" Daily Activity     Outcome Measure   Help from another person eating meals?: None Help from another person taking care of personal grooming?: None Help from another person toileting, which includes using toliet, bedpan, or urinal?: A Little Help from another person bathing (including washing, rinsing, drying)?: A Little Help from another person to put on and taking off regular upper body clothing?: A Little Help from another person to put on and taking off regular lower body clothing?: A Little 6 Click Score: 20    End of Session Equipment Utilized During Treatment: Rolling walker;Gait belt  OT Visit Diagnosis: Unsteadiness on feet (R26.81);Muscle weakness (generalized) (M62.81);Pain   Activity Tolerance Patient tolerated treatment well   Patient Left in chair;with call bell/phone within reach;with chair alarm set   Nurse Communication Mobility status;Other (comment)(spoke with NT about BM in toilet)        Time: [id number] OT Time Calculation (min): 34 min  Charges: OT  General Charges $OT Visit: 1 Visit OT Treatments $Self Care/Home Management : 8-22 mins  [place], [place] [phone] [place] 05/04/2019, 3:27 PM

## 2019-05-04 NOTE — Progress Notes (Signed)
Calorie Count Note  48 hour calorie count ordered.  Diet: SOFT diet  Day 1 Lunch: 311 kcal, 13 g protein  Dinner: Did not order  Breakfast: NPO for TEE Supplements: 700 kcal, 40 g protein  Total intake: 1011 kcal (48% of minimum estimated needs)  53 protein (50% of minimum estimated needs)  Estimated Nutritional Needs:  Kcal:  2100-2300 kcal Protein:  105-120 grams Fluid:  >/= 2.1 L/day  Nutrition Dx: Increased nutrient needs related to post-op healing as evidenced by estimated needs. -Ongoing  Goal: Patient will meet greater than or equal to 90% of their needs. -Progressing  Intervention:   Ensure Enlive po TID, each supplement provides 350 kcal and 20 grams of protein  Magic cup BID with meals, each supplement provides 290 kcal and 9 grams of protein  Continue TPN at half rate- supplement intake increasing, consider d/c within next day    Mariana Single RD, LDN Clinical Nutrition Pager # [phone]

## 2019-05-04 NOTE — TOC Progression Note (Signed)
Transition of Care (TOC) - Progression Note  Marvetta Gibbons RN, BSN Transitions of Care Unit 4E- RN Case Manager [phone]   Patient Details  Name: CHAS BRANAN MRN: [id number] Date of Birth: 11/21/1933  Transition of Care [facility]) CM/SW Contact  Dahlia Client, Romeo Rabon, RN Phone Number: 05/04/2019, 2:11 PM  Clinical Narrative:    Spoke with son Ronalee Belts regarding transition of care needs per conversation, Ronalee Belts states he has spoken with HTA for [facility] needs and in network providers- family preference for choice would be [facility] first, then [place] followed by [facility]. Explained how skilled [facility] services worked vs family wanting someone to stay with pt during day while they work- which would be private duty- son states they do have an agency in mind that they are looking at for Private duty needs. Pt has RW at home will follow for any other DME needs prior to discharge. Pt will need HH orders placed for discharge and then Cm will make referral per pt/family choice. CM to continue to follow for transition of care needs.    Expected Discharge Plan: [place] Barriers to Discharge: Continued Medical Work up  Expected Discharge Plan and Services Expected Discharge Plan: [place] In-house Referral: Clinical Social Work Discharge Planning Services: CM Consult   Living arrangements for the past 2 months: Single Family Home                                       Social Determinants of Health (SDOH) Interventions    Readmission Risk Interventions No flowsheet data found.

## 2019-05-04 NOTE — CV Procedure (Signed)
    TRANSESOPHAGEAL ECHOCARDIOGRAM   NAME:  DEAVONTE THOR    MRN: [id number] DOB:  1934-06-16    ADMIT DATE: 04/24/2019  INDICATIONS: Bacteremia   PROCEDURE:   Informed consent was obtained prior to the procedure. The risks, benefits and alternatives for the procedure were discussed and the patient comprehended these risks.  Risks include, but are not limited to, cough, sore throat, vomiting, nausea, somnolence, esophageal and stomach trauma or perforation, bleeding, low blood pressure, aspiration, pneumonia, infection, trauma to the teeth and death.    Procedural time out performed. The oropharynx was anesthetized with topical 1% benzocaine.    The procedure was aborted due to inability to pass the TEE probe into the esophagus despite 4 attempts. He received 4 mg versed and 50 mg fentanyl. There were no immediate complications.   COMPLICATIONS:    There were no immediate complications.  KEY FINDINGS:  Unable to complete TEE due to inability to pass TEE probe. Would recommend a barium swallow evaluation to look for any stricture to explain why the probe is unable to pass into the esophagus before further attempts.   [name] T. Audie Box, Cash  [address], [place], [place] [address] [phone]  10:03 AM

## 2019-05-04 NOTE — Progress Notes (Signed)
Physical Therapy Treatment Patient Details Name: Calvin Burnett MRN: [id number] DOB: [birth year] Today's Date: 05/04/2019    History of Present Illness Calvin Burnett is a 85 y.o. male with PMH: a-fib, alxheimer's dementia, diabetes, GERD, hypertension and prostate cancer who was recently admitted with intra-abdominal abscess due to perforated viscus from April 13, 2009 discharged home on antibiotics. PSH: history of right-sided colectomy and sigmoidoscopy in May 2019. Pt admitted for worsening abd pain. Pt underwent abdominal ex lap on10/13.  Abd xray 04/29/19: concerning for ileusor distal small bowel obstruction.    PT Comments    Patient seen for mobility progression. Encouraged to pt to attempt gait without AD however pt reports feeling like he needs it still. Pt requires min guard/min A for gait training with instability noted while turning with RW. HR noted to be up to 122 bpm while mobilizing. Current plan remains appropriate.    Follow Up Recommendations  Home health PT;Supervision/Assistance - 24 hour     Equipment Recommendations  Rolling walker with 5" wheels    Recommendations for Other Services       Precautions / Restrictions Precautions Precautions: Fall Restrictions Weight Bearing Restrictions: No    Mobility  Bed Mobility Overal bed mobility: Needs Assistance Bed Mobility: Supine to Sit     Supine to sit: Supervision     General bed mobility comments: supervision for safety with use of bed features  Transfers Overall transfer level: Needs assistance Equipment used: Rolling walker (2 wheeled) Transfers: Sit to/from Stand Sit to Stand: Supervision         General transfer comment: cues for safe hand placement  Ambulation/Gait Ambulation/Gait assistance: Min guard Gait Distance (Feet): 400 Feet Assistive device: Rolling walker (2 wheeled) Gait Pattern/deviations: Decreased stride length;Step-through pattern Gait velocity: decreased    General Gait Details: min A for turning around in hallway; decreased cadence; grossly steady gait with slight difficulty with use of RW when turning; no LOB    Stairs             Wheelchair Mobility    Modified Rankin (Stroke Patients Only)       Balance Overall balance assessment: Needs assistance Sitting-balance support: Feet supported Sitting balance-Leahy Scale: Good     Standing balance support: Single extremity supported;During functional activity Standing balance-Leahy Scale: Poor Standing balance comment: reliant on BUE support during functional mobility; reports he feels like he needs RW for ambulation; at least 1 extremity supported during standing grooming                            Cognition Arousal/Alertness: Awake/alert Behavior During Therapy: WFL for tasks assessed/performed Overall Cognitive Status: History of cognitive impairments - at baseline Area of Impairment: Orientation                 Orientation Level: Time             General Comments: Per chart pt has baseline alzheimers dementia; pt more conversational this session      Exercises      General Comments General comments (skin integrity, edema, etc.): HR MAX 120 after functional mobility; O2 WNL after functional mobility      Pertinent Vitals/Pain Pain Assessment: No/denies pain    Home Living                      Prior Function  PT Goals (current goals can now be found in the care plan section) Acute Rehab PT Goals Patient Stated Goal: to get better and go home Progress towards PT goals: Progressing toward goals    Frequency    Min 3X/week      PT Plan Current plan remains appropriate    Co-evaluation PT/OT/SLP Co-Evaluation/Treatment: Yes Reason for Co-Treatment: For patient/therapist safety;Other (comment)(pt tolerance) PT goals addressed during session: Mobility/safety with mobility OT goals addressed during session:  ADL's and self-care;Proper use of Adaptive equipment and DME      AM-PAC PT "6 Clicks" Mobility   Outcome Measure  Help needed turning from your back to your side while in a flat bed without using bedrails?: A Little Help needed moving from lying on your back to sitting on the side of a flat bed without using bedrails?: A Little Help needed moving to and from a bed to a chair (including a wheelchair)?: A Little Help needed standing up from a chair using your arms (e.g., wheelchair or bedside chair)?: A Little Help needed to walk in hospital room?: A Little Help needed climbing 3-5 steps with a railing? : A Little 6 Click Score: 18    End of Session Equipment Utilized During Treatment: Gait belt Activity Tolerance: Patient tolerated treatment well Patient left: in chair;with call bell/phone within reach;with chair alarm set Nurse Communication: Mobility status PT Visit Diagnosis: Muscle weakness (generalized) (M62.81);Difficulty in walking, not elsewhere classified (R26.2)     Time: [id number] PT Time Calculation (min) (ACUTE ONLY): 34 min  Charges:  $Gait Training: 8-22 mins                     Earney Navy, PTA Acute Rehabilitation Services Pager: [phone] Office: [phone]     Darliss Cheney 05/04/2019, 4:57 PM

## 2019-05-04 NOTE — Progress Notes (Signed)
    [facility] has been requested to perform a transesophageal echocardiogram on 10/20 for bacteremia.  After careful review of history and examination, the risks and benefits of transesophageal echocardiogram have been explained including risks of esophageal damage, perforation (1:10,000 risk), bleeding, pharyngeal hematoma as well as other potential complications associated with conscious sedation including aspiration, arrhythmia, respiratory failure and death. Alternatives to treatment were discussed, questions were answered. Patient is willing to proceed.   Rosaria Ferries, PA-C 05/04/2019 9:01 AM

## 2019-05-04 NOTE — Progress Notes (Signed)
Patient ID: Calvin Burnett, male   DOB: January 12, 1934, 85 y.o.   MRN: [id number]    Day of Surgery  Subjective: Patient states he overall feels well today.  No abdominal pain.  Ate some of his food yesterday he thinks.  Had BM yesterday and today.  Going for TEE due to blood cx positive for strep constellatus  Objective: Vital signs in last 24 hours: Temp:  [97.4 F (36.3 C)-98.4 F (36.9 C)] 97.8 F (36.6 C) (10/20 0800) Pulse Rate:  [77-98] 79 (10/20 0919) Resp:  [19-22] 19 (10/20 0919) BP: (105-137)/(72-85) 137/79 (10/20 0919) SpO2:  [96 %-99 %] 98 % (10/20 0919) Last BM Date: 05/02/19  Intake/Output from previous day: 10/19 0701 - 10/20 0700 In: 973.8 [P.O.:600; I.V.:373.8] Out: 1329 [Urine:1325; Stool:4] Intake/Output this shift: No intake/output data recorded.  PE: Abd: soft, minimally tender, +BS, minimally bloated Ext: BLE with +2 edema  Lab Results:  Recent Labs    05/03/19 0443 05/04/19 0413  WBC 13.5* 13.4*  HGB 11.0* 9.4*  HCT 32.9* 28.7*  PLT 379 328   BMET Recent Labs    05/03/19 0443 05/04/19 0413  NA 134* 133*  K 4.6 4.8  CL 101 101  CO2 24 25  GLUCOSE 150* 127*  BUN 15 20  CREATININE 0.97 1.08  CALCIUM 7.3* 7.6*   PT/INR No results for input(s): LABPROT, INR in the last 72 hours. CMP     Component Value Date/Time   NA 133 (L) 05/04/2019 0413   K 4.8 05/04/2019 0413   CL 101 05/04/2019 0413   CO2 25 05/04/2019 0413   GLUCOSE 127 (H) 05/04/2019 0413   BUN 20 05/04/2019 0413   CREATININE 1.08 05/04/2019 0413   CALCIUM 7.6 (L) 05/04/2019 0413   PROT 5.0 (L) 05/03/2019 0443   ALBUMIN 1.7 (L) 05/03/2019 0443   AST 29 05/03/2019 0443   ALT 16 05/03/2019 0443   ALKPHOS 87 05/03/2019 0443   BILITOT 0.6 05/03/2019 0443   GFRNONAA >60 05/04/2019 0413   GFRAA >60 05/04/2019 0413   Lipase     Component Value Date/Time   LIPASE 42 04/24/2019 1848       Studies/Results: No results found.  Anti-infectives: Anti-infectives (From  admission, onward)   Start     Dose/Rate Route Frequency Ordered Stop   05/04/19 0830  metroNIDAZOLE (FLAGYL) IVPB 500 mg     500 mg 100 mL/hr over 60 Minutes Intravenous Every 8 hours 05/04/19 0740     05/04/19 0800  cefTRIAXone (ROCEPHIN) 2 g in sodium chloride 0.9 % 100 mL IVPB     2 g 200 mL/hr over 30 Minutes Intravenous Every 24 hours 05/04/19 0740     04/29/19 1600  cefTRIAXone (ROCEPHIN) 2 g in sodium chloride 0.9 % 100 mL IVPB  Status:  Discontinued     2 g 200 mL/hr over 30 Minutes Intravenous Every 24 hours 04/29/19 1557 05/01/19 0900   04/29/19 1600  metroNIDAZOLE (FLAGYL) IVPB 500 mg  Status:  Discontinued     500 mg 100 mL/hr over 60 Minutes Intravenous Every 8 hours 04/29/19 1557 05/01/19 1341   04/27/19 1700  piperacillin-tazobactam (ZOSYN) IVPB 3.375 g  Status:  Discontinued     3.375 g 12.5 mL/hr over 240 Minutes Intravenous Every 8 hours 04/27/19 1640 04/29/19 1557   04/25/19 2200  vancomycin (VANCOCIN) 1,250 mg in sodium chloride 0.9 % 250 mL IVPB  Status:  Discontinued     1,250 mg 166.7 mL/hr over 90 Minutes Intravenous  Every 24 hours 04/25/19 0158 04/27/19 1640   04/25/19 2100  ceFEPIme (MAXIPIME) 2 g in sodium chloride 0.9 % 100 mL IVPB  Status:  Discontinued     2 g 200 mL/hr over 30 Minutes Intravenous Every 24 hours 04/24/19 2213 04/25/19 0900   04/25/19 1400  ceFEPIme (MAXIPIME) 2 g in sodium chloride 0.9 % 100 mL IVPB  Status:  Discontinued     2 g 200 mL/hr over 30 Minutes Intravenous Every 12 hours 04/25/19 1341 04/27/19 1640   04/25/19 1000  cefTRIAXone (ROCEPHIN) 2 g in sodium chloride 0.9 % 100 mL IVPB  Status:  Discontinued     2 g 200 mL/hr over 30 Minutes Intravenous Every 24 hours 04/25/19 0841 04/25/19 1335   04/25/19 0015  metroNIDAZOLE (FLAGYL) IVPB 500 mg  Status:  Discontinued     500 mg 100 mL/hr over 60 Minutes Intravenous Every 8 hours 04/25/19 0006 04/27/19 1640   04/25/19 0000  vancomycin (VANCOCIN) 1,500 mg in sodium chloride 0.9 % 500  mL IVPB     1,500 mg 250 mL/hr over 120 Minutes Intravenous  Once 04/24/19 2355 04/25/19 0248   04/24/19 2215  ceFEPIme (MAXIPIME) 2 g in sodium chloride 0.9 % 100 mL IVPB  Status:  Discontinued     2 g 200 mL/hr over 30 Minutes Intravenous Every 24 hours 04/24/19 2211 04/24/19 2213   04/24/19 2100  ceFEPIme (MAXIPIME) 2 g in sodium chloride 0.9 % 100 mL IVPB     2 g 200 mL/hr over 30 Minutes Intravenous  Once 04/24/19 2046 04/24/19 2154   04/24/19 2100  metroNIDAZOLE (FLAGYL) IVPB 500 mg     500 mg 100 mL/hr over 60 Minutes Intravenous  Once 04/24/19 2046 04/24/19 2223       Assessment/Plan A fib, onEliquis Alzheimer's dementia HTN HxT1N0colon cancer of hepatic flexure, sigmoid stricture S/P robotic LOA, right colectomy, sigmoidectomy 11/26/17 CKD Strep Constellatus bacteremia - for TEE today  POD 7, S/p exploratory laparotomy/ LOA/ SB resection with anastomosis 10/13 - [place] -soft diet and ensure for additional supplementation.  half rate TNA, but would like to get this off soon due to some component of some fluid overload in his LEs -give a dose of lasix today, +3-4L for entirety of stay -mobilize more. TID ambulation.  PT ordered -transition IV meds to po meds for pain, protonix, etc -WBC stable at 13K.  FEN -soft diet, glucerna, half rate TNA today VTE -Eliquis ID - Rocephin/Flagyl   LOS: 10 days    [name] , [facility] Surgery 05/04/2019, 9:27 AM Please see Amion for pager number during day hours 7:00am-4:30pm

## 2019-05-04 NOTE — Progress Notes (Signed)
[name] PROGRESS NOTE  JAXYN BULGER [id number] DOB: Jun 14, 1934 DOA: 04/24/2019 PCP: Marin Olp, MD  Brief summary   Calvin Burnett is a 85 y.o. male with medical history significant of diabetes, GERD, hypertension and prostate cancer who was recently admitted with intra-abdominal abscess due to perforated viscus from April 13, 2009 discharged home on antibiotics, history of A. fib, currently on Eliquis.  Hypertension GERD and Alzheimer's dementia, history of right-sided colectomy and sigmoidoscopy in May 2019, pneumoperitoneum who presented again with abdominal pain as well as fever.  Patient had temperature up to 102.  He was on Eliquis and no surgery was done previous admission.  His abscess was deemed not drainable at the time.  He was found to have worsening intra-abdominal abscess.  Patient has not been on antibiotics in the last few days.   Upon arrival to ED, his temperature was 100.5 blood pressure 100/67 pulse 98 respiratory 24 oxygen sat 95% on room air.  Sodium 135 potassium 3.3 chloride 103 CO2 22 glucose 142.  Creatinine 1.27 calcium 7.2.  Albumin 2.0.  White count 17.9 platelet lites 406 and hemoglobin 12.8.  COVID-19 screen is negative.  CT abdomen pelvis shows interval development of multiple intra-abdominal abscesses measure approximately 6.2 x 5.2 x 7.1 cm.  Multiple thick-walled loops of small bowel in the mid abdomen small volume pneumoperitoneum which is decreased from previous small bilateral pleural effusions with bladder wall thickening.    He was admitted under [facility].  Surgery was consulted.  He underwent intra-abdominal drain placement by IR on 04/25/2019.  He was continued on broad-spectrum IV antibiotics.  Underwent exploratory laparotomy on 04/27/2019.  Patient's initial blood pressure which were drawn on 04/24/2019 turned out to be positive with Streptococcus constellatus on 04/28/2019.  Patient's antibiotics were de-escalated to Rocephin 2 g IV and  Flagyl were continued.  Transthoracic echo obtained on 04/29/2019 did not show any signs of endocarditis.  Discussed with ID and per their recommendation, spoke to cardiology.  TEE planned today.  Assessment/Plan:  Intra-abdominal abscesses/gram-positive bacteremia: recurrent infections. Ct abd: Interval development of multiple intra-abdominal abscesses as detailed above measuring up to approximately 6.2 x 5.2 by 7.1 cm. status post intra-abdominal drainage placed by IR on 04/25/2019.  He then underwent exploratory laparotomy on 04/27/2019, -Blood culture from 04/24/2019+ with Streptococcus constellatus, antibiotics switched to IV Rocephin 2 g daily along with Flagyl which I will continue.  Repeat blood culture from 04/30/2019 are negative thus far. Transthoracic echo negative for any signs of endocarditis.  I consulted ID and spoke to Dr. Graylon Good personally.  She recommended proceeding with TEE.  I called cardiology Master on 04/30/2019 and spoke to Lester.  Plan for TEE today.  Per pharmacy, his antibiotics were discontinued on 04/30/2017.  Asked pharmacy to resume his Rocephin and Flagyl today.  Diabetes mellitus type II:  Blood sugar control.  Patient is getting insulin through his TPN.  Dementia: Stable.  He is alert and oriented.  Chronic kidney disease stage III: Also at baseline.  Continue monitoring  Paroxysmal atrial fibrillation: Rate is controlled.  In sinus rhythm.  Eliquis was resumed on 05/02/2019.  Continue that.  Hypertension:  Blood pressure very well controlled.  Continue current regimen.  DVT prophylaxis: Eliquis Code Status: full Family Communication: Plan of care discussed with patient.  He verbalized understanding.  No questions asked. Disposition Plan: TBD. Per general surgery.  Consultants:  Surgery  IR  Procedures:  Intra-abdominal drain placement on 04/25/2019  Exploratory laparotomy on  04/27/2019  Antibiotics: Anti-infectives (From admission, onward)    Start     Dose/Rate Route Frequency Ordered Stop   05/04/19 0830  metroNIDAZOLE (FLAGYL) IVPB 500 mg     500 mg 100 mL/hr over 60 Minutes Intravenous Every 8 hours 05/04/19 0740     05/04/19 0800  cefTRIAXone (ROCEPHIN) 2 g in sodium chloride 0.9 % 100 mL IVPB     2 g 200 mL/hr over 30 Minutes Intravenous Every 24 hours 05/04/19 0740     04/29/19 1600  cefTRIAXone (ROCEPHIN) 2 g in sodium chloride 0.9 % 100 mL IVPB  Status:  Discontinued     2 g 200 mL/hr over 30 Minutes Intravenous Every 24 hours 04/29/19 1557 05/01/19 0900   04/29/19 1600  metroNIDAZOLE (FLAGYL) IVPB 500 mg  Status:  Discontinued     500 mg 100 mL/hr over 60 Minutes Intravenous Every 8 hours 04/29/19 1557 05/01/19 1341   04/27/19 1700  piperacillin-tazobactam (ZOSYN) IVPB 3.375 g  Status:  Discontinued     3.375 g 12.5 mL/hr over 240 Minutes Intravenous Every 8 hours 04/27/19 1640 04/29/19 1557   04/25/19 2200  vancomycin (VANCOCIN) 1,250 mg in sodium chloride 0.9 % 250 mL IVPB  Status:  Discontinued     1,250 mg 166.7 mL/hr over 90 Minutes Intravenous Every 24 hours 04/25/19 0158 04/27/19 1640   04/25/19 2100  ceFEPIme (MAXIPIME) 2 g in sodium chloride 0.9 % 100 mL IVPB  Status:  Discontinued     2 g 200 mL/hr over 30 Minutes Intravenous Every 24 hours 04/24/19 2213 04/25/19 0900   04/25/19 1400  ceFEPIme (MAXIPIME) 2 g in sodium chloride 0.9 % 100 mL IVPB  Status:  Discontinued     2 g 200 mL/hr over 30 Minutes Intravenous Every 12 hours 04/25/19 1341 04/27/19 1640   04/25/19 1000  cefTRIAXone (ROCEPHIN) 2 g in sodium chloride 0.9 % 100 mL IVPB  Status:  Discontinued     2 g 200 mL/hr over 30 Minutes Intravenous Every 24 hours 04/25/19 0841 04/25/19 1335   04/25/19 0015  metroNIDAZOLE (FLAGYL) IVPB 500 mg  Status:  Discontinued     500 mg 100 mL/hr over 60 Minutes Intravenous Every 8 hours 04/25/19 0006 04/27/19 1640   04/25/19 0000  vancomycin (VANCOCIN) 1,500 mg in sodium chloride 0.9 % 500 mL IVPB     1,500  mg 250 mL/hr over 120 Minutes Intravenous  Once 04/24/19 2355 04/25/19 0248   04/24/19 2215  ceFEPIme (MAXIPIME) 2 g in sodium chloride 0.9 % 100 mL IVPB  Status:  Discontinued     2 g 200 mL/hr over 30 Minutes Intravenous Every 24 hours 04/24/19 2211 04/24/19 2213   04/24/19 2100  ceFEPIme (MAXIPIME) 2 g in sodium chloride 0.9 % 100 mL IVPB     2 g 200 mL/hr over 30 Minutes Intravenous  Once 04/24/19 2046 04/24/19 2154   04/24/19 2100  metroNIDAZOLE (FLAGYL) IVPB 500 mg     500 mg 100 mL/hr over 60 Minutes Intravenous  Once 04/24/19 2046 04/24/19 2223       (indicate start date, and stop date if known)  HPI/Subjective: Seen and examined.  No complaints.  Abdominal pain improved.  Objective: Vitals:   05/04/19 0410 05/04/19 0800  BP: 105/77 125/80  Pulse: 77   Resp: (!) 22 19  Temp: 97.6 F (36.4 C) 97.8 F (36.6 C)  SpO2: 96% 97%    Intake/Output Summary (Last 24 hours) at 05/04/2019 0900 Last data filed at  05/04/2019 0529 Gross per 24 hour  Intake 973.81 ml  Output 1079 ml  Net -105.19 ml   Filed Weights   04/26/19 0406 04/27/19 0500 05/03/19 0402  Weight: 83.1 kg 84.2 kg 82 kg    Exam:  General exam: Appears calm and comfortable  Respiratory system: Clear to auscultation. Respiratory effort normal. Cardiovascular system: S1 & S2 heard, RRR. No JVD, murmurs, rubs, gallops or clicks. No pedal edema. Gastrointestinal system: Abdomen is nondistended, soft and minimal generalized tenderness. No organomegaly or masses felt. Normal bowel sounds heard. Central nervous system: Alert and oriented. No focal neurological deficits. Extremities: Symmetric 5 x 5 power. Skin: No rashes, lesions or ulcers.  Psychiatry: Judgement and insight appear poor. Mood & affect appropriate.     Data Reviewed: Basic Metabolic Panel: Recent Labs  Lab 04/30/19 0815 05/01/19 0536 05/02/19 0439 05/03/19 0443 05/04/19 0413  NA 138 138 137 134* 133*  K 3.5 4.0 4.3 4.6 4.8  CL 107  108 105 101 101  CO2 24 25 24 24 25   GLUCOSE 162* 109* 123* 150* 127*  BUN 12 13 13 15 20   CREATININE 0.95 0.83 0.85 0.97 1.08  CALCIUM 7.1* 7.4* 7.3* 7.3* 7.6*  MG 1.7 1.9 2.0 1.8 1.9  PHOS 1.8* 2.6 3.1 3.7 3.9   Liver Function Tests: Recent Labs  Lab 04/28/19 0434 04/29/19 0500 05/03/19 0443  AST 33 21 29  ALT 25 17 16   ALKPHOS 90 68 87  BILITOT 1.3* 0.7 0.6  PROT 5.1* 4.6* 5.0*  ALBUMIN 2.0* 1.7* 1.7*   No results for input(s): LIPASE, AMYLASE in the last 168 hours. No results for input(s): AMMONIA in the last 168 hours. CBC: Recent Labs  Lab 04/30/19 1014 05/01/19 0536 05/02/19 0439 05/03/19 0443 05/04/19 0413  WBC 14.0* 13.5* 10.9* 13.5* 13.4*  NEUTROABS 10.6* 9.2* 9.3* 11.6* 11.5*  HGB 10.6* 10.4* 10.3* 11.0* 9.4*  HCT 30.1* 30.6* 30.0* 32.9* 28.7*  MCV 94.4 93.9 94.0 94.0 94.7  PLT 405* 401* 386 379 328   Cardiac Enzymes: No results for input(s): CKTOTAL, CKMB, CKMBINDEX, TROPONINI in the last 168 hours. BNP (last 3 results) No results for input(s): BNP in the last 8760 hours.  ProBNP (last 3 results) Recent Labs    01/29/19 0852  PROBNP 165.0*    CBG: Recent Labs  Lab 05/03/19 1209 05/03/19 1607 05/03/19 2013 05/04/19 0016 05/04/19 0411  GLUCAP 189* 182* 187* 202* 132*    Recent Results (from the past 240 hour(s))  Urine culture     Status: Abnormal   Collection Time: 04/24/19  6:53 PM   Specimen: In/Out Cath Urine  Result Value Ref Range Status   Specimen Description IN/OUT CATH URINE  Final   Special Requests   Final    added 2248 Performed at [facility], [place] [address] [place], [place] [address]    Culture 20,000 COLONIES/mL YEAST (A)  Final   Report Status 04/26/2019 FINAL  Final  Culture, blood (routine x 2)     Status: Abnormal   Collection Time: 04/24/19  9:00 PM   Specimen: BLOOD  Result Value Ref Range Status   Specimen Description BLOOD RIGHT ANTECUBITAL  Final   Special Requests   Final    BOTTLES DRAWN  AEROBIC AND ANAEROBIC Blood Culture adequate volume   Culture  Setup Time   Final    GRAM POSITIVE COCCI IN CHAINS IN PAIRS IN BOTH AEROBIC AND ANAEROBIC BOTTLES CRITICAL RESULT CALLED TO, READ BACK BY AND  VERIFIED WITH: Salli Real [id number] 04/26/2019 Mena Goes Performed at [facility], [place] [address] [place], [place] [address]    Culture STREPTOCOCCUS CONSTELLATUS (A)  Final   Report Status 04/28/2019 FINAL  Final   Organism ID, Bacteria STREPTOCOCCUS CONSTELLATUS  Final      Susceptibility   Streptococcus constellatus - MIC*    PENICILLIN INTERMEDIATE Intermediate     CEFTRIAXONE 1 SENSITIVE Sensitive     ERYTHROMYCIN <=0.12 SENSITIVE Sensitive     LEVOFLOXACIN 0.5 SENSITIVE Sensitive     VANCOMYCIN 0.5 SENSITIVE Sensitive     * STREPTOCOCCUS CONSTELLATUS  Blood Culture ID Panel (Reflexed)     Status: Abnormal   Collection Time: 04/24/19  9:00 PM  Result Value Ref Range Status   Enterococcus species NOT DETECTED NOT DETECTED Final   Listeria monocytogenes NOT DETECTED NOT DETECTED Final   Staphylococcus species NOT DETECTED NOT DETECTED Final   Staphylococcus aureus (BCID) NOT DETECTED NOT DETECTED Final   Streptococcus species DETECTED (A) NOT DETECTED Final    Comment: Not Enterococcus species, Streptococcus agalactiae, Streptococcus pyogenes, or Streptococcus pneumoniae. CRITICAL RESULT CALLED TO, READ BACK BY AND VERIFIED WITH: G. ABBOTT,PHARMD ED:[id number] 04/26/2019 T. TYSOR    Streptococcus agalactiae NOT DETECTED NOT DETECTED Final   Streptococcus pneumoniae NOT DETECTED NOT DETECTED Final   Streptococcus pyogenes NOT DETECTED NOT DETECTED Final   Acinetobacter baumannii NOT DETECTED NOT DETECTED Final   Enterobacteriaceae species NOT DETECTED NOT DETECTED Final   Enterobacter cloacae complex NOT DETECTED NOT DETECTED Final   Escherichia coli NOT DETECTED NOT DETECTED Final   Klebsiella oxytoca NOT DETECTED NOT DETECTED Final   Klebsiella pneumoniae NOT DETECTED NOT  DETECTED Final   Proteus species NOT DETECTED NOT DETECTED Final   Serratia marcescens NOT DETECTED NOT DETECTED Final   Haemophilus influenzae NOT DETECTED NOT DETECTED Final   Neisseria meningitidis NOT DETECTED NOT DETECTED Final   Pseudomonas aeruginosa NOT DETECTED NOT DETECTED Final   Candida albicans NOT DETECTED NOT DETECTED Final   Candida glabrata NOT DETECTED NOT DETECTED Final   Candida krusei NOT DETECTED NOT DETECTED Final   Candida parapsilosis NOT DETECTED NOT DETECTED Final   Candida tropicalis NOT DETECTED NOT DETECTED Final    Comment: Performed at [facility], [place] [address] [place], [place] [address]  Culture, blood (routine x 2)     Status: Abnormal   Collection Time: 04/24/19  9:10 PM   Specimen: BLOOD LEFT FOREARM  Result Value Ref Range Status   Specimen Description BLOOD LEFT FOREARM  Final   Special Requests   Final    BOTTLES DRAWN AEROBIC AND ANAEROBIC Blood Culture adequate volume   Culture  Setup Time   Final    GRAM POSITIVE COCCI AEROBIC BOTTLE ONLY CRITICAL VALUE NOTED.  VALUE IS CONSISTENT WITH PREVIOUSLY REPORTED AND CALLED VALUE.    Culture (A)  Final    STREPTOCOCCUS CONSTELLATUS SUSCEPTIBILITIES PERFORMED ON PREVIOUS CULTURE WITHIN THE LAST 5 DAYS. Performed at [facility], [place] [address], [place], [place] [address]    Report Status 04/28/2019 FINAL  Final  SARS CORONAVIRUS 2 (TAT 6-24 HRS) Nasopharyngeal Nasopharyngeal Swab     Status: None   Collection Time: 04/25/19 12:11 AM   Specimen: Nasopharyngeal Swab  Result Value Ref Range Status   SARS Coronavirus 2 NEGATIVE NEGATIVE Final    Comment: (NOTE) SARS-CoV-2 target nucleic acids are NOT DETECTED. The SARS-CoV-2 RNA is generally detectable in upper and lower respiratory specimens during the  acute phase of infection. Negative results do not preclude SARS-CoV-2 infection, do not rule out co-infections with other pathogens, and should not be used as the sole basis for  treatment or other patient management decisions. Negative results must be combined with clinical observations, patient history, and epidemiological information. The expected result is Negative. Fact Sheet for Patients: SugarRoll.be Fact Sheet for Healthcare Providers: https://www.woods-mathews.com/ This test is not yet approved or cleared by the [place] FDA and  has been authorized for detection and/or diagnosis of SARS-CoV-2 by FDA under an Emergency Use Authorization (EUA). This EUA will remain  in effect (meaning this test can be used) for the duration of the COVID-19 declaration under Section 56 4(b)(1) of the Act, 21 U.S.C. section 360bbb-3(b)(1), unless the authorization is terminated or revoked sooner. Performed at [facility], [place] [address], [place], [place] [address]   Aerobic/Anaerobic Culture (surgical/deep wound)     Status: Abnormal   Collection Time: 04/25/19  2:40 PM   Specimen: Abscess  Result Value Ref Range Status   Specimen Description ABSCESS  Final   Special Requests Normal  Final   Gram Stain   Final    MODERATE WBC PRESENT, PREDOMINANTLY PMN ABUNDANT GRAM NEGATIVE RODS MODERATE GRAM POSITIVE COCCI Performed at [facility], [address], [place], [place] [address]    Culture (A)  Final    MULTIPLE ORGANISMS PRESENT, NONE PREDOMINANT MIXED ANAEROBIC FLORA PRESENT.  CALL LAB IF FURTHER IID REQUIRED.    Report Status 04/27/2019 FINAL  Final  Surgical pcr screen     Status: None   Collection Time: 04/27/19  9:06 AM   Specimen: Nasal Mucosa; Nasal Swab  Result Value Ref Range Status   MRSA, PCR NEGATIVE NEGATIVE Final   Staphylococcus aureus NEGATIVE NEGATIVE Final    Comment: (NOTE) The Xpert SA Assay (FDA approved for NASAL specimens in patients 16 years of age and older), is one component of a comprehensive surveillance program. It is not intended to diagnose infection nor to guide or  monitor treatment. Performed at [facility], [place] [address], [place], [place] [address]   Culture, blood (routine x 2)     Status: None (Preliminary result)   Collection Time: 04/30/19  4:32 PM   Specimen: BLOOD LEFT HAND  Result Value Ref Range Status   Specimen Description BLOOD LEFT HAND  Final   Special Requests   Final    BOTTLES DRAWN AEROBIC AND ANAEROBIC Blood Culture adequate volume   Culture   Final    NO GROWTH 4 DAYS Performed at [facility], [place] [address], [place], [place] [address]    Report Status PENDING  Incomplete  Culture, blood (routine x 2)     Status: None (Preliminary result)   Collection Time: 04/30/19  4:37 PM   Specimen: BLOOD  Result Value Ref Range Status   Specimen Description BLOOD LEFT THUMB  Final   Special Requests   Final    BOTTLES DRAWN AEROBIC AND ANAEROBIC Blood Culture adequate volume   Culture   Final    NO GROWTH 4 DAYS Performed at [facility], [place] [address] [place],  [address]    Report Status PENDING  Incomplete     Studies: No results found.  Scheduled Meds: . acetaminophen  1,000 mg Oral Q6H  . amLODipine  5 mg Oral Daily  . apixaban  5 mg Oral BID  . Chlorhexidine Gluconate Cloth  6 each Topical  Daily  . feeding supplement (ENSURE ENLIVE)  237 mL Oral QID  . insulin aspart  0-15 Units Subcutaneous Q4H  . latanoprost  1 drop Both Eyes QHS  . metoprolol succinate  12.5 mg Oral Daily  . pantoprazole  40 mg Oral Daily   Continuous Infusions: . cefTRIAXone (ROCEPHIN)  IV    . lactated ringers 10 mL/hr at 04/27/19 1016  . metronidazole    . TPN ADULT (ION) 40 mL/hr at 05/04/19 0309    Principal Problem:   Intra-abdominal abscess [facility]) Active Problems:   Essential hypertension   GERD (gastroesophageal reflux disease)   Type 2 diabetes mellitus ([name])   Prostate cancer ([place])   Hyperlipidemia associated with type 2 diabetes mellitus ([place])   CKD (chronic kidney disease), stage III ([name])   AF  (paroxysmal atrial fibrillation) ([place])   Perforation bowel ([place])   Dementia ([place])   Pneumoperitoneum  Time spent: 26 minutes  Neyah Ellerman  [name] If 7PM-7AM, please contact night-coverage at [email or web address], password [email or web address] 05/04/2019, 9:00 AM  LOS: 10 days

## 2019-05-04 NOTE — Progress Notes (Signed)
Aborted TEE. Unable to pass probe after multiple attempted.

## 2019-05-04 NOTE — Progress Notes (Signed)
PHARMACY - ADULT TOTAL PARENTERAL NUTRITION CONSULT NOTE  Pharmacy Consult:  TPN Indication: Prolonged Ileus   Patient Measurements: Height: 5\' 10"  (177.8 cm) Weight: 180 lb 12.8 oz (82 kg) IBW/kg (Calculated) : 73 TPN AdjBW (KG): 82 Body mass index is 25.94 kg/m.  [name] in Sept, 72 kg on admit  Assessment:  69 YOM with history T1N0 adenocarcinoma s/p LoA, right colectomy, sigmoidectomy on 11/26/2017 admitted on 04/24/19 with intra-abdominal abscesses secondary to SB perforation/infection.  He is s/p ex-lap with extensive LoA and SBR with primary anastomosis on 04/27/19. Pharmacy consulted to manage TPN for prolonged ileus.  Although patient has had several admissions with abdominal pain and surgery, he denies losing weight and reduced PO intake.  He eats 2-3 meals a day, at [employer] and [employer].  No risk for refeeding based on patient's report; however, trending down electrolytes made it possible that he is refeeding.  GI: hx GERD.  Pre-albumin 5.8>>9, LBM 10/18, NG removed - PPI daily Endo: DM on metformin PTA - CBGs 127-202 Insulin requirements in the past 24 hours: 18 units SSI   Lytes: K up 4.8 (goal >/= 4 for ileus), Phos 3.9, Mag 1.9 after 2g yesterday (goal >/= 2 for ileus), Na down 133, others WNL Renal: SCr 1.08, BUN WNL -slight trend up, UOP 0.7 ml/kg/hr; Lasix 20mg  IV x1 today Pulm: stable on RA Cards: Afib/HTN - intermittent high BP/HR - IV metoprolol, off Eliquis Hepatobil: LFTs / tbili / TG WNL Neuro: Alzheimer - PRN morphine ID: Zosyn 10/13 >>10/15; CTX/Flagyl 10/15 >>10/17, resumed 10/20 for IAI/SB perf - afebrile, WBC up 13.4  TPN Access: PICC placed 04/28/19 TPN start date: 04/28/19  Nutritional Goals (RD rec 05/03/19): Kcal:  2100-2300 kcal Protein:  105-120 grams Fluid:  >/= 2.1 L/day  Current Nutrition: TPN Advanced Soft diet Ensure Enlive QID (each provides )- 2 yesterday   Plan:  Continue TPN at half of goal rate at 40 ml/hr. This  TPN provides 55 gms of protein, 158.4 grams of dextrose and 33 grams of lipids which give total of 1084 kcals. This meets ~50% of patient protein and calorie goals. Electrolytes in TPN: slight decrease in K and Phos as rising; further increase Mag, maintain [id number] 1:2. Daily multivitamin and trace elements in TPN Continue moderate SSI Q4H F/U AM labs, CBGs, and toleration of enteral diet -- Hopefully can wean off TPN tomorrow or the next day  Magnesium 1g IV x1  Sloan Leiter, PharmD, BCPS, BCCCP Clinical Pharmacist Clinical phone 05/04/2019 until [phone] Please refer to [facility] for [place] numbers 05/04/2019, 7:09 AM

## 2019-05-04 NOTE — H&P (View-Only) (Signed)
    [facility] has been requested to perform a transesophageal echocardiogram on 10/20 for bacteremia.  After careful review of history and examination, the risks and benefits of transesophageal echocardiogram have been explained including risks of esophageal damage, perforation (1:10,000 risk), bleeding, pharyngeal hematoma as well as other potential complications associated with conscious sedation including aspiration, arrhythmia, respiratory failure and death. Alternatives to treatment were discussed, questions were answered. Patient is willing to proceed.   Rosaria Ferries, PA-C 05/04/2019 9:01 AM

## 2019-05-04 NOTE — Plan of Care (Signed)
Poc progressing.  

## 2019-05-04 NOTE — Interval H&P Note (Signed)
History and Physical Interval Note:  05/04/2019 9:42 AM  Calvin Burnett  has presented today for surgery, with the diagnosis of bacteremia.  The various methods of treatment have been discussed with the patient and family. After consideration of risks, benefits and other options for treatment, the patient has consented to  Procedure(s): TRANSESOPHAGEAL ECHOCARDIOGRAM (TEE) (N/A) as a surgical intervention.  The patient's history has been reviewed, patient examined, no change in status, stable for surgery.  I have reviewed the patient's chart and labs.  Questions were answered to the patient's satisfaction.     [name] T. Audie Box, [place]  [address] [place], [place] [address] [phone]  9:43 AM

## 2019-05-04 NOTE — Progress Notes (Signed)
Pharmacy note - antibiotic stewardship  Physician notes reviewed and discussed with Dr. Doristine Bosworth.  He would like to continue ceftriaxone and metronidazole until TEE is obtained to rule out endocarditis from streptococcus constellatus.    Heide Guile, PharmD, BCPS-AQ ID Clinical Pharmacist [phone]

## 2019-05-05 LAB — CBC WITH DIFFERENTIAL/PLATELET
Abs Immature Granulocytes: 0.57 10*3/uL — ABNORMAL HIGH (ref 0.00–0.07)
Basophils Absolute: 0.1 10*3/uL (ref 0.0–0.1)
Basophils Relative: 1 %
Eosinophils Absolute: 0.2 10*3/uL (ref 0.0–0.5)
Eosinophils Relative: 2 %
HCT: 27.2 % — ABNORMAL LOW (ref 39.0–52.0)
Hemoglobin: 9.2 g/dL — ABNORMAL LOW (ref 13.0–17.0)
Immature Granulocytes: 5 %
Lymphocytes Relative: 15 %
Lymphs Abs: 1.8 10*3/uL (ref 0.7–4.0)
MCH: 31.8 pg (ref 26.0–34.0)
MCHC: 33.8 g/dL (ref 30.0–36.0)
MCV: 94.1 fL (ref 80.0–100.0)
Monocytes Absolute: 0.9 10*3/uL (ref 0.1–1.0)
Monocytes Relative: 7 %
Neutro Abs: 8.3 10*3/uL — ABNORMAL HIGH (ref 1.7–7.7)
Neutrophils Relative %: 70 %
Platelets: 304 10*3/uL (ref 150–400)
RBC: 2.89 MIL/uL — ABNORMAL LOW (ref 4.22–5.81)
RDW: 14.6 % (ref 11.5–15.5)
WBC: 11.8 10*3/uL — ABNORMAL HIGH (ref 4.0–10.5)
nRBC: 0 % (ref 0.0–0.2)

## 2019-05-05 LAB — GLUCOSE, CAPILLARY
Glucose-Capillary: 175 mg/dL — ABNORMAL HIGH (ref 70–99)
Glucose-Capillary: 131 mg/dL — ABNORMAL HIGH (ref 70–99)
Glucose-Capillary: 202 mg/dL — ABNORMAL HIGH (ref 70–99)
Glucose-Capillary: 167 mg/dL — ABNORMAL HIGH (ref 70–99)
Glucose-Capillary: 169 mg/dL — ABNORMAL HIGH (ref 70–99)

## 2019-05-05 LAB — CULTURE, BLOOD (ROUTINE X 2)
Culture: NO GROWTH
Culture: NO GROWTH
Special Requests: ADEQUATE
Special Requests: ADEQUATE

## 2019-05-05 LAB — BASIC METABOLIC PANEL
Anion gap: 7 (ref 5–15)
BUN: 21 mg/dL (ref 8–23)
CO2: 26 mmol/L (ref 22–32)
Calcium: 7.6 mg/dL — ABNORMAL LOW (ref 8.9–10.3)
Chloride: 104 mmol/L (ref 98–111)
Creatinine, Ser: 1.15 mg/dL (ref 0.61–1.24)
GFR calc Af Amer: >60 mL/min (ref >60)
GFR calc non Af Amer: 59 mL/min — ABNORMAL LOW (ref >60)
Glucose, Bld: 190 mg/dL — ABNORMAL HIGH (ref 70–99)
Potassium: 4.5 mmol/L (ref 3.5–5.1)
Sodium: 137 mmol/L (ref 135–145)

## 2019-05-05 NOTE — Plan of Care (Signed)
Poc progressing.  

## 2019-05-05 NOTE — Progress Notes (Signed)
PROGRESS NOTE    Calvin Burnett  [id number] DOB: 11-05-1933 DOA: 04/24/2019 PCP: Marin Olp, MD   Brief Narrative: Calvin Miltimore Turneris Calvin Burnett 85 y.o.malewith medical history significant ofdiabetes, GERD, hypertension and prostate cancer who was recently admitted with intra-abdominal abscess due to perforated viscus from April 13, 2009 discharged home on antibiotics, history of Calvin Burnett. fib, currently on Eliquis. Hypertension GERD and Alzheimer's dementia, history of right-sided colectomy and sigmoidoscopy in May 2019, pneumoperitoneum who presented again with abdominal pain as well as fever. Patient had temperature up to 102. He was on Eliquis and no surgery was done previous admission. His abscess was deemed not drainable at the time. He was found to have worsening intra-abdominal abscess. Patient has not been on antibiotics in the last few days.   Upon arrival to ED, his temperature was 100.5 blood pressure 100/67 pulse 98 respiratory 24 oxygen sat 95% on room air. Sodium 135 potassium 3.3 chloride 103 CO2 22 glucose 142. Creatinine 1.27 calcium 7.2. Albumin 2.0. White count 17.9 platelet lites 406 and hemoglobin 12.8. COVID-19 screen is negative. CT abdomen pelvis shows interval development of multiple intra-abdominal abscesses measure approximately 6.2 x 5.2 x 7.1 cm. Multiple thick-walled loops of small bowel in the mid abdomen small volume pneumoperitoneum which is decreased from previous small bilateral pleural effusions with bladder wall thickening.   He was admitted under [place].  Surgery was consulted.  He underwent intra-abdominal drain placement by IR on 04/25/2019.  He was continued on broad-spectrum IV antibiotics.  Underwent exploratory laparotomy on 04/27/2019.  Patient's initial blood pressure which were drawn on 04/24/2019 turned out to be positive with Streptococcus constellatus on 04/28/2019.  Patient's antibiotics were de-escalated to Rocephin 2 g IV and Flagyl were  continued.  Transthoracic echo obtained on 04/29/2019 did not show any signs of endocarditis.  Discussed with ID and per their recommendation, spoke to cardiology.  TEE planned for 10/20, but failed.  Assessment & Plan:   Principal Problem:   Intra-abdominal abscess ([place]) Active Problems:   Essential hypertension   GERD (gastroesophageal reflux disease)   Type 2 diabetes mellitus ([name])   Prostate cancer ([place])   Hyperlipidemia associated with type 2 diabetes mellitus ([name])   CKD (chronic kidney disease), stage III ([name])   AF (paroxysmal atrial fibrillation) ([place])   Perforation bowel ([name])   Dementia ([name])   Pneumoperitoneum  Intra-abdominal abscesses/gram-positive bacteremia:recurrent infections. Ct abd: Interval development of multiple intra-abdominal abscesses as detailed above measuring up to approximately 6.2 x 5.2 by 7.1 cm. status post intra-abdominal drainage placed by IR on 04/25/2019.  He then underwent exploratory laparotomy on 04/27/2019. - Blood cx 10/10 -> strep constellatus  - repeat blood cx from 10/16 NGTD - Surgical cx with mixed anaerobic flora, multiple organisms - TEE failed on 10/20, unable to pass TEE probe - will follow with barium swallow - Discussed with ID, Dr. Baxter Flattery, TTE without evidence of vegetations, recommended 14 days abx therapy for strep constellatus bacteremia (additional 5 days of ceftriaxone - of note, pt was off abx for ~2 days for unclear reason) - Continue ceftriaxone and flagyl - TNA d/c'd, pt now on soft diet  Diabetes mellitus type II: Follow blood sugars.  Continue SSI.  Dementia:Stable.  He is alert and oriented.  Chronic kidney disease stage [id number] at baseline. Continue monitoring  Paroxysmal atrial fibrillation:Rate is controlled. In sinus rhythm.  Eliquis was resumed on 05/02/2019.  Continue that.  Hypertension: Blood pressure very well controlled.  Continue current regimen.  DVT  prophylaxis: eliquis Code Status:  full code Family Communication: none at bedside Disposition Plan: pending further imrpovement, completion of abx therapy   Consultants:   Surgery  ID over phone  IR  Procedures:   Intra abdominal drain placement 10/11  Ex lap on 10/13  Antimicrobials:  Anti-infectives (From admission, onward)   Start     Dose/Rate Route Frequency Ordered Stop   05/04/19 0830  metroNIDAZOLE (FLAGYL) IVPB 500 mg     500 mg 100 mL/hr over 60 Minutes Intravenous Every 8 hours 05/04/19 0740     05/04/19 0800  cefTRIAXone (ROCEPHIN) 2 g in sodium chloride 0.9 % 100 mL IVPB     2 g 200 mL/hr over 30 Minutes Intravenous Every 24 hours 05/04/19 0740     04/29/19 1600  cefTRIAXone (ROCEPHIN) 2 g in sodium chloride 0.9 % 100 mL IVPB  Status:  Discontinued     2 g 200 mL/hr over 30 Minutes Intravenous Every 24 hours 04/29/19 1557 05/01/19 0900   04/29/19 1600  metroNIDAZOLE (FLAGYL) IVPB 500 mg  Status:  Discontinued     500 mg 100 mL/hr over 60 Minutes Intravenous Every 8 hours 04/29/19 1557 05/01/19 1341   04/27/19 1700  piperacillin-tazobactam (ZOSYN) IVPB 3.375 g  Status:  Discontinued     3.375 g 12.5 mL/hr over 240 Minutes Intravenous Every 8 hours 04/27/19 1640 04/29/19 1557   04/25/19 2200  vancomycin (VANCOCIN) 1,250 mg in sodium chloride 0.9 % 250 mL IVPB  Status:  Discontinued     1,250 mg 166.7 mL/hr over 90 Minutes Intravenous Every 24 hours 04/25/19 0158 04/27/19 1640   04/25/19 2100  ceFEPIme (MAXIPIME) 2 g in sodium chloride 0.9 % 100 mL IVPB  Status:  Discontinued     2 g 200 mL/hr over 30 Minutes Intravenous Every 24 hours 04/24/19 2213 04/25/19 0900   04/25/19 1400  ceFEPIme (MAXIPIME) 2 g in sodium chloride 0.9 % 100 mL IVPB  Status:  Discontinued     2 g 200 mL/hr over 30 Minutes Intravenous Every 12 hours 04/25/19 1341 04/27/19 1640   04/25/19 1000  cefTRIAXone (ROCEPHIN) 2 g in sodium chloride 0.9 % 100 mL IVPB  Status:  Discontinued     2 g 200 mL/hr over 30 Minutes  Intravenous Every 24 hours 04/25/19 0841 04/25/19 1335   04/25/19 0015  metroNIDAZOLE (FLAGYL) IVPB 500 mg  Status:  Discontinued     500 mg 100 mL/hr over 60 Minutes Intravenous Every 8 hours 04/25/19 0006 04/27/19 1640   04/25/19 0000  vancomycin (VANCOCIN) 1,500 mg in sodium chloride 0.9 % 500 mL IVPB     1,500 mg 250 mL/hr over 120 Minutes Intravenous  Once 04/24/19 2355 04/25/19 0248   04/24/19 2215  ceFEPIme (MAXIPIME) 2 g in sodium chloride 0.9 % 100 mL IVPB  Status:  Discontinued     2 g 200 mL/hr over 30 Minutes Intravenous Every 24 hours 04/24/19 2211 04/24/19 2213   04/24/19 2100  ceFEPIme (MAXIPIME) 2 g in sodium chloride 0.9 % 100 mL IVPB     2 g 200 mL/hr over 30 Minutes Intravenous  Once 04/24/19 2046 04/24/19 2154   04/24/19 2100  metroNIDAZOLE (FLAGYL) IVPB 500 mg     500 mg 100 mL/hr over 60 Minutes Intravenous  Once 04/24/19 2046 04/24/19 2223     Subjective: Feels weak and tired Otherwise, no specific complaints - some abdominal soreness  Objective: Vitals:   05/05/19 0436 05/05/19 0842 05/05/19 0845 05/05/19 1106  BP: 109/70 124/75  116/77  Pulse: 94 99 100 94  Resp: 20 16  20   Temp: 98.1 F (36.7 C) 97.9 F (36.6 C)  98.5 F (36.9 C)  TempSrc: Oral Oral  Oral  SpO2: 98% 99%  100%  Weight:      Height:        Intake/Output Summary (Last 24 hours) at 05/05/2019 1603 Last data filed at 05/05/2019 1500 Gross per 24 hour  Intake 2061.4 ml  Output 1525 ml  Net 536.4 ml   Filed Weights   04/26/19 0406 04/27/19 0500 05/03/19 0402  Weight: 83.1 kg 84.2 kg 82 kg    Examination:  General exam: Appears calm and comfortable  Respiratory system: Clear to auscultation. Respiratory effort normal. Cardiovascular system: S1 & S2 heard, RRR Gastrointestinal system: Abdomen is nondistended, soft and mildly TTP.  Abdominal staples. Central nervous system: Alert and oriented. No focal neurological deficits. Extremities: no lee Skin: No rashes, lesions or  ulcers Psychiatry: Judgement and insight appear normal. Mood & affect appropriate.     Data Reviewed: I have personally reviewed following labs and imaging studies  CBC: Recent Labs  Lab 05/01/19 0536 05/02/19 0439 05/03/19 0443 05/04/19 0413 05/05/19 0500  WBC 13.5* 10.9* 13.5* 13.4* 11.8*  NEUTROABS 9.2* 9.3* 11.6* 11.5* 8.3*  HGB 10.4* 10.3* 11.0* 9.4* 9.2*  HCT 30.6* 30.0* 32.9* 28.7* 27.2*  MCV 93.9 94.0 94.0 94.7 94.1  PLT 401* [phone] [id number]   Basic Metabolic Panel: Recent Labs  Lab 04/30/19 0815 05/01/19 0536 05/02/19 0439 05/03/19 0443 05/04/19 0413 05/05/19 0500  NA 138 138 137 134* 133* 137  K 3.5 4.0 4.3 4.6 4.8 4.5  CL 107 108 105 101 101 104  CO2 24 25 24 24 25 26   GLUCOSE 162* 109* 123* 150* 127* 190*  BUN 12 13 13 15 20 21   CREATININE 0.95 0.83 0.85 0.97 1.08 1.15  CALCIUM 7.1* 7.4* 7.3* 7.3* 7.6* 7.6*  MG 1.7 1.9 2.0 1.8 1.9  --   PHOS 1.8* 2.6 3.1 3.7 3.9  --    GFR: Estimated Creatinine Clearance: 50.3 mL/min (by C-G formula based on SCr of 1.15 mg/dL). Liver Function Tests: Recent Labs  Lab 04/29/19 0500 05/03/19 0443  AST 21 29  ALT 17 16  ALKPHOS 68 87  BILITOT 0.7 0.6  PROT 4.6* 5.0*  ALBUMIN 1.7* 1.7*   No results for input(s): LIPASE, AMYLASE in the last 168 hours. No results for input(s): AMMONIA in the last 168 hours. Coagulation Profile: No results for input(s): INR, PROTIME in the last 168 hours. Cardiac Enzymes: No results for input(s): CKTOTAL, CKMB, CKMBINDEX, TROPONINI in the last 168 hours. BNP (last 3 results) Recent Labs    01/29/19 0852  PROBNP 165.0*   HbA1C: No results for input(s): HGBA1C in the last 72 hours. CBG: Recent Labs  Lab 05/04/19 2042 05/04/19 2350 05/05/19 0509 05/05/19 0801 05/05/19 1152  GLUCAP 170* 152* 175* 131* 202*   Lipid Profile: Recent Labs    05/03/19 0443  TRIG 55   Thyroid Function Tests: No results for input(s): TSH, T4TOTAL, FREET4, T3FREE, THYROIDAB in the last 72  hours. Anemia Panel: No results for input(s): VITAMINB12, FOLATE, FERRITIN, TIBC, IRON, RETICCTPCT in the last 72 hours. Sepsis Labs: No results for input(s): PROCALCITON, LATICACIDVEN in the last 168 hours.  Recent Results (from the past 240 hour(s))  Surgical pcr screen     Status: None   Collection Time: 04/27/19  9:06 AM  Specimen: Nasal Mucosa; Nasal Swab  Result Value Ref Range Status   MRSA, PCR NEGATIVE NEGATIVE Final   Staphylococcus aureus NEGATIVE NEGATIVE Final    Comment: (NOTE) The Xpert SA Assay (FDA approved for NASAL specimens in patients 84 years of age and older), is one component of [facility] [place] comprehensive surveillance program. It is not intended to diagnose infection nor to guide or monitor treatment. Performed at [facility], [place] [address] [place], [place] [address]   Culture, blood (routine x 2)     Status: None   Collection Time: 04/30/19  4:32 PM   Specimen: BLOOD LEFT HAND  Result Value Ref Range Status   Specimen Description BLOOD LEFT HAND  Final   Special Requests   Final    BOTTLES DRAWN AEROBIC AND ANAEROBIC Blood Culture adequate volume   Culture   Final    NO GROWTH 5 DAYS Performed at [facility], [place] [address] [place], [place] [address]    Report Status 05/05/2019 FINAL  Final  Culture, blood (routine x 2)     Status: None   Collection Time: 04/30/19  4:37 PM   Specimen: BLOOD  Result Value Ref Range Status   Specimen Description BLOOD LEFT THUMB  Final   Special Requests   Final    BOTTLES DRAWN AEROBIC AND ANAEROBIC Blood Culture adequate volume   Culture   Final    NO GROWTH 5 DAYS Performed at [facility], [place] [address], [place],  [address]    Report Status 05/05/2019 FINAL  Final         Radiology Studies: No results found.      Scheduled Meds:  acetaminophen  1,000 mg Oral Q6H   amLODipine  5 mg Oral Daily   apixaban  5 mg Oral BID   Chlorhexidine Gluconate Cloth  6 each Topical Daily     feeding supplement (ENSURE ENLIVE)  237 mL Oral QID   insulin aspart  0-15 Units Subcutaneous Q4H   latanoprost  1 drop Both Eyes QHS   metoprolol succinate  12.5 mg Oral Daily   pantoprazole  40 mg Oral Daily   Continuous Infusions:  cefTRIAXone (ROCEPHIN)  IV 2 g (05/05/19 0849)   lactated ringers 10 mL/hr at 04/27/19 1016   metronidazole 500 mg (05/05/19 1401)   TPN ADULT (ION) Stopped (05/05/19 1405)     LOS: 11 days    Time spent: over 30 min    Fayrene Helper, MD [name] Pager AMION  If 7PM-7AM, please contact night-coverage [email or web address] Password [email or web address] 05/05/2019, 4:03 PM

## 2019-05-05 NOTE — Progress Notes (Signed)
Spoke to CSW about COVID test. Will not test patient yet because pt has not picked SNF and insurance Josem Kaufmann has not been started at this time. Will do COVID test closer to d/c to prevent having to repeat test.  Clyde Canterbury, RN

## 2019-05-05 NOTE — Progress Notes (Signed)
I spoke to Ronalee Belts, the patient's son, and he can provide some assistance but not 24 hr assistance.  The patient lives on his own.  He would like for [place] to look into SNF for short term until the patient can get stronger and return home with at a Mod I level.  Will consult SW.  [name] 11:20 AM 05/05/2019

## 2019-05-05 NOTE — Progress Notes (Signed)
Calorie Count Note  48 hour calorie count ordered.  Diet: SOFT diet  Day 2 Lunch: no meal ticket or charting Dinner: no meal ticket or charting Breakfast: 600 kcal, 15 g Supplements: 990 kcal, 49 g protein  Total intake: 1590 kcal (76% of minimum estimated needs)  64 protein (61% of minimum estimated needs)  Estimated Nutritional Needs:  Kcal:  2100-2300 kcal Protein:  105-120 grams Fluid:  >/= 2.1 L/day  Nutrition Dx: Increased nutrient needs related to post-op healing as evidenced by estimated needs. -Ongoing  Goal: Patient will meet greater than or equal to 90% of their needs. -Progressing  Intervention:   Ensure Enlive po TID, each supplement provides 350 kcal and 20 grams of protein  Magic cup BID with meals, each supplement provides 290 kcal and 9 grams of protein  D/C TPN-intake progressing   Mariana Single RD, LDN Clinical Nutrition Pager # [phone]

## 2019-05-05 NOTE — TOC Progression Note (Signed)
Transition of Care [facility]) - Progression Note    Patient Details  Name: Calvin Burnett MRN: [id number] Date of Birth: 20-Sep-1933  Transition of Care [facility]) CM/SW [place], [place] Phone Number: 05/05/2019, 5:17 PM  Clinical Narrative:     Spoke with patient's daughter,Amy- CSW introduced self and explained role. CSW explained the SNF process and patient's insurance co-pay for SNF. Patient is walking 454ft, CSW advised to consider back up plan if insurance denies SNF placement.   Thurmond Butts, MSW, [facility] Clinical Social Worker [phone]   Expected Discharge Plan: [place] Barriers to Discharge: Continued Medical Work up  Expected Discharge Plan and Services Expected Discharge Plan: [place] In-house Referral: Clinical Social Work Discharge Planning Services: CM Consult   Living arrangements for the past 2 months: [place]                 DME Arranged: 3-N-1, [place] rolling DME Agency: [employer]       HH Arranged: PT, OT HH Agency: [facility] [employer] Date [place]: 05/05/19 Time [place]: 1300 Representative spoke with at [place]: [place] ([place]) Interventions    Readmission Risk Interventions No flowsheet data found.

## 2019-05-05 NOTE — Progress Notes (Signed)
PHARMACY - ADULT TOTAL PARENTERAL NUTRITION CONSULT NOTE  Pharmacy Consult:  TPN Indication: Prolonged Ileus   Patient Measurements: Height: 5\' 10"  (177.8 cm) Weight: 180 lb 12.8 oz (82 kg) IBW/kg (Calculated) : 73 TPN AdjBW (KG): 82 Body mass index is 25.94 kg/m.  [name] in Sept, 72 kg on admit  Assessment:  58 YOM with history T1N0 adenocarcinoma s/p LoA, right colectomy, sigmoidectomy on 11/26/2017 admitted on 04/24/19 with intra-abdominal abscesses secondary to SB perforation/infection.  He is s/p ex-lap with extensive LoA and SBR with primary anastomosis on 04/27/19. Pharmacy consulted to manage TPN for prolonged ileus.  Although patient has had several admissions with abdominal pain and surgery, he denies losing weight and reduced PO intake.  He eats 2-3 meals a day, at [employer] and [employer].  No risk for refeeding based on patient's report; however, trending down electrolytes made it possible that he is refeeding.  GI: hx GERD.  Pre-albumin 5.8>>9, LBM 10/121 - PPI PO Endo: DM on metformin PTA - CBGs controlled Insulin requirements in the past 24 hours: 13 units SSI   Lytes: all WNL Renal: SCr 1.15, BUN WNL - UOP 0.9 ml/kg/hr; Lasix 20mg  IV on 10/20 Pulm: stable on RA Cards: Afib/HTN - VSS - Eliquis, Norvasc, Toprol Hepatobil: LFTs / tbili / TG WNL Neuro: Alzheimer - PRN morphine, scheduled APAP ID: Zosyn 10/13 >>10/15; CTX/Flagyl 10/15 >>10/17, resumed 10/20 for IAI/SB perf - afebrile, WBC down to 11.8 TPN Access: PICC placed 04/28/19 TPN start date: 04/28/19  Nutritional Goals (RD rec 05/03/19): 2100-2300 kCal, 105-120 gm protein per day  Current Nutrition: TPN Soft diet Ensure Enlive QID - 3 charted given yesterday Calorie count - meeting > 60% of needs  Plan:  Reduce TPN to 20 ml/hr x 2 hrs, then stop D/C TPN labs and nursing care orders  Shemeika Starzyk D. Mina Marble, PharmD, BCPS, [address] 05/05/2019, 9:14 AM

## 2019-05-05 NOTE — TOC Progression Note (Signed)
Transition of Care [facility]) - Progression Note    Patient Details  Name: LEONCE JETT MRN: [id number] Date of Birth: 05-05-1934  Transition of Care [facility]) CM/SW [place], [place] Phone Number: 05/05/2019, 4:30 PM  Clinical Narrative:     CSW visit with the patient at bedside. Patient states he lives home alone and is willing to go to rehab prior to discharging home. CSW explained the SNF process and advised his insurance has to approve SNF placement. Patient states he understands.   CSW called patient's son,Mike,- left voice message to return call.  Thurmond Butts, MSW, [facility] Clinical Social Worker [phone]   Expected Discharge Plan: [place] Barriers to Discharge: Continued Medical Work up  Expected Discharge Plan and Services Expected Discharge Plan: [place] In-house Referral: Clinical Social Work Discharge Planning Services: CM Consult   Living arrangements for the past 2 months: Single Family Home                                       Social Determinants of Health (SDOH) Interventions    Readmission Risk Interventions No flowsheet data found.

## 2019-05-05 NOTE — Progress Notes (Addendum)
Patient ID: MARLONE PARADISO, male   DOB: 1933/09/22, 85 y.o.   MRN: [id number]    1 Day Post-Op  Subjective: Patient doing well.  Sitting up in bed and has cleaned his entire plate of food and eating his magic cup.  Had a BM last night and needs to have one right now.  No nausea.    Objective: Vital signs in last 24 hours: Temp:  [97.8 F (36.6 C)-98.1 F (36.7 C)] 97.9 F (36.6 C) (10/21 0842) Pulse Rate:  [76-100] 100 (10/21 0845) Resp:  [14-26] 16 (10/21 0842) BP: (96-138)/(59-99) 124/75 (10/21 0842) SpO2:  [94 %-100 %] 99 % (10/21 0842) Last BM Date: 05/05/19  Intake/Output from previous day: 10/20 0701 - 10/21 0700 In: 1002.1 [P.O.:474; I.V.:428.1; IV Piggyback:100] Out: 1850 [Urine:1850] Intake/Output this shift: Total I/O In: -  Out: 375 [Urine:375]  PE: Abd: soft, appropriately tender, +BS, ND, incision is c/d/i with staples  Lab Results:  Recent Labs    05/04/19 0413 05/05/19 0500  WBC 13.4* 11.8*  HGB 9.4* 9.2*  HCT 28.7* 27.2*  PLT 328 304   BMET Recent Labs    05/04/19 0413 05/05/19 0500  NA 133* 137  K 4.8 4.5  CL 101 104  CO2 25 26  GLUCOSE 127* 190*  BUN 20 21  CREATININE 1.08 1.15  CALCIUM 7.6* 7.6*   PT/INR No results for input(s): LABPROT, INR in the last 72 hours. CMP     Component Value Date/Time   NA 137 05/05/2019 0500   K 4.5 05/05/2019 0500   CL 104 05/05/2019 0500   CO2 26 05/05/2019 0500   GLUCOSE 190 (H) 05/05/2019 0500   BUN 21 05/05/2019 0500   CREATININE 1.15 05/05/2019 0500   CALCIUM 7.6 (L) 05/05/2019 0500   PROT 5.0 (L) 05/03/2019 0443   ALBUMIN 1.7 (L) 05/03/2019 0443   AST 29 05/03/2019 0443   ALT 16 05/03/2019 0443   ALKPHOS 87 05/03/2019 0443   BILITOT 0.6 05/03/2019 0443   GFRNONAA 59 (L) 05/05/2019 0500   GFRAA >60 05/05/2019 0500   Lipase     Component Value Date/Time   LIPASE 42 04/24/2019 1848       Studies/Results: No results found.  Anti-infectives: Anti-infectives (From admission,  onward)   Start     Dose/Rate Route Frequency Ordered Stop   05/04/19 0830  metroNIDAZOLE (FLAGYL) IVPB 500 mg     500 mg 100 mL/hr over 60 Minutes Intravenous Every 8 hours 05/04/19 0740     05/04/19 0800  cefTRIAXone (ROCEPHIN) 2 g in sodium chloride 0.9 % 100 mL IVPB     2 g 200 mL/hr over 30 Minutes Intravenous Every 24 hours 05/04/19 0740     04/29/19 1600  cefTRIAXone (ROCEPHIN) 2 g in sodium chloride 0.9 % 100 mL IVPB  Status:  Discontinued     2 g 200 mL/hr over 30 Minutes Intravenous Every 24 hours 04/29/19 1557 05/01/19 0900   04/29/19 1600  metroNIDAZOLE (FLAGYL) IVPB 500 mg  Status:  Discontinued     500 mg 100 mL/hr over 60 Minutes Intravenous Every 8 hours 04/29/19 1557 05/01/19 1341   04/27/19 1700  piperacillin-tazobactam (ZOSYN) IVPB 3.375 g  Status:  Discontinued     3.375 g 12.5 mL/hr over 240 Minutes Intravenous Every 8 hours 04/27/19 1640 04/29/19 1557   04/25/19 2200  vancomycin (VANCOCIN) 1,250 mg in sodium chloride 0.9 % 250 mL IVPB  Status:  Discontinued     1,250 mg 166.7  mL/hr over 90 Minutes Intravenous Every 24 hours 04/25/19 0158 04/27/19 1640   04/25/19 2100  ceFEPIme (MAXIPIME) 2 g in sodium chloride 0.9 % 100 mL IVPB  Status:  Discontinued     2 g 200 mL/hr over 30 Minutes Intravenous Every 24 hours 04/24/19 2213 04/25/19 0900   04/25/19 1400  ceFEPIme (MAXIPIME) 2 g in sodium chloride 0.9 % 100 mL IVPB  Status:  Discontinued     2 g 200 mL/hr over 30 Minutes Intravenous Every 12 hours 04/25/19 1341 04/27/19 1640   04/25/19 1000  cefTRIAXone (ROCEPHIN) 2 g in sodium chloride 0.9 % 100 mL IVPB  Status:  Discontinued     2 g 200 mL/hr over 30 Minutes Intravenous Every 24 hours 04/25/19 0841 04/25/19 1335   04/25/19 0015  metroNIDAZOLE (FLAGYL) IVPB 500 mg  Status:  Discontinued     500 mg 100 mL/hr over 60 Minutes Intravenous Every 8 hours 04/25/19 0006 04/27/19 1640   04/25/19 0000  vancomycin (VANCOCIN) 1,500 mg in sodium chloride 0.9 % 500 mL IVPB      1,500 mg 250 mL/hr over 120 Minutes Intravenous  Once 04/24/19 2355 04/25/19 0248   04/24/19 2215  ceFEPIme (MAXIPIME) 2 g in sodium chloride 0.9 % 100 mL IVPB  Status:  Discontinued     2 g 200 mL/hr over 30 Minutes Intravenous Every 24 hours 04/24/19 2211 04/24/19 2213   04/24/19 2100  ceFEPIme (MAXIPIME) 2 g in sodium chloride 0.9 % 100 mL IVPB     2 g 200 mL/hr over 30 Minutes Intravenous  Once 04/24/19 2046 04/24/19 2154   04/24/19 2100  metroNIDAZOLE (FLAGYL) IVPB 500 mg     500 mg 100 mL/hr over 60 Minutes Intravenous  Once 04/24/19 2046 04/24/19 2223       Assessment/Plan A fib, onEliquis Alzheimer's dementia HTN HxT1N0colon cancer of hepatic flexure, sigmoid stricture S/P robotic LOA, right colectomy, sigmoidectomy 11/26/17 CKD Strep Constellatus bacteremia - unable to do TEE, defer to medicine on abx needs.  Can certainly switch to oral abx from our standpoint as patient is almost surgically stable for DC home.  POD 8,S/p exploratory laparotomy/ LOA/ SB resection with anastomosis 10/13 - [place] -soft diet and ensure for additional supplementation.  -DC TNA today -mobilize more. TID ambulation. will order HH needs per therapies request -transition IV meds to po meds for pain, protonix, etc -WBC down 11 today  FEN -soft diet, ensure, DC TNA VTE -Eliquis ID - Rocephin/Flagyl, ? Convert to oral for transition to home soon?  Attempted to contact son, Ronalee Belts, but went to voicemail.  Left a voicemail.   LOS: 11 days    [name] , [facility] 05/05/2019, 9:08 AM Please see Amion for pager number during day hours 7:00am-4:30pm

## 2019-05-06 ENCOUNTER — Ambulatory Visit: Payer: PPO | Admitting: Family Medicine

## 2019-05-06 ENCOUNTER — Inpatient Hospital Stay (HOSPITAL_COMMUNITY): Payer: PPO

## 2019-05-06 LAB — GLUCOSE, CAPILLARY
Glucose-Capillary: 102 mg/dL — ABNORMAL HIGH (ref 70–99)
Glucose-Capillary: 109 mg/dL — ABNORMAL HIGH (ref 70–99)
Glucose-Capillary: 151 mg/dL — ABNORMAL HIGH (ref 70–99)
Glucose-Capillary: 168 mg/dL — ABNORMAL HIGH (ref 70–99)
Glucose-Capillary: 179 mg/dL — ABNORMAL HIGH (ref 70–99)
Glucose-Capillary: 97 mg/dL (ref 70–99)

## 2019-05-06 LAB — COMPREHENSIVE METABOLIC PANEL
ALT: 17 U/L (ref 0–44)
AST: 24 U/L (ref 15–41)
Albumin: 1.8 g/dL — ABNORMAL LOW (ref 3.5–5.0)
Alkaline Phosphatase: 109 U/L (ref 38–126)
Anion gap: 7 (ref 5–15)
BUN: 22 mg/dL (ref 8–23)
CO2: 25 mmol/L (ref 22–32)
Calcium: 7.5 mg/dL — ABNORMAL LOW (ref 8.9–10.3)
Chloride: 104 mmol/L (ref 98–111)
Creatinine, Ser: 1.2 mg/dL (ref 0.61–1.24)
GFR calc Af Amer: >60 mL/min (ref >60)
GFR calc non Af Amer: 56 mL/min — ABNORMAL LOW (ref >60)
Glucose, Bld: 116 mg/dL — ABNORMAL HIGH (ref 70–99)
Potassium: 4.7 mmol/L (ref 3.5–5.1)
Sodium: 136 mmol/L (ref 135–145)
Total Bilirubin: 0.5 mg/dL (ref 0.3–1.2)
Total Protein: 5 g/dL — ABNORMAL LOW (ref 6.5–8.1)

## 2019-05-06 LAB — CBC
HCT: 27.8 % — ABNORMAL LOW (ref 39.0–52.0)
Hemoglobin: 9 g/dL — ABNORMAL LOW (ref 13.0–17.0)
MCH: 30.9 pg (ref 26.0–34.0)
MCHC: 32.4 g/dL (ref 30.0–36.0)
MCV: 95.5 fL (ref 80.0–100.0)
Platelets: 332 10*3/uL (ref 150–400)
RBC: 2.91 MIL/uL — ABNORMAL LOW (ref 4.22–5.81)
RDW: 14.6 % (ref 11.5–15.5)
WBC: 10.6 10*3/uL — ABNORMAL HIGH (ref 4.0–10.5)
nRBC: 0 % (ref 0.0–0.2)

## 2019-05-06 LAB — MAGNESIUM: Magnesium: 1.8 mg/dL (ref 1.7–2.4)

## 2019-05-06 MED ORDER — INSULIN ASPART 100 UNIT/ML ~~LOC~~ SOLN
0.0000 [IU] | Freq: Three times a day (TID) | SUBCUTANEOUS | Status: DC
  Administered 2019-05-07: 2 [IU] via SUBCUTANEOUS
  Administered 2019-05-07: 3 [IU] via SUBCUTANEOUS
  Administered 2019-05-08: 18:00:00 2 [IU] via SUBCUTANEOUS
  Administered 2019-05-08: 3 [IU] via SUBCUTANEOUS
  Administered 2019-05-08 – 2019-05-09 (×2): 2 [IU] via SUBCUTANEOUS
  Administered 2019-05-09 – 2019-05-11 (×4): 3 [IU] via SUBCUTANEOUS

## 2019-05-06 NOTE — TOC Progression Note (Addendum)
Transition of Care [facility]) - Progression Note    Patient Details  Name: MACEN CARRANO MRN: [id number] Date of Birth: [birth year]  Transition of Care [facility]) CM/SW [place], [place] Phone Number: 05/06/2019, 11:12 AM  Clinical Narrative:     Patient's daughter, Amy, called and left voice message- the family has decided it is best for the patient to discharge home. She advised he will move in the home with his daughter. All of his children will rotate caring for him.   Thurmond Butts, MSW, [facility] Clinical Social Worker [phone]   Expected Discharge Plan: [place] Barriers to Discharge: Continued Medical Work up  Expected Discharge Plan and Services Expected Discharge Plan: [place] In-house Referral: Clinical Social Work Discharge Planning Services: CM Consult   Living arrangements for the past 2 months: [place]                 DME Arranged: 3-N-1, [place] rolling DME Agency: [employer]       HH Arranged: PT, OT HH Agency: [facility] [employer]) Date [place]: 05/05/19 Time [place]: 1300 Representative spoke with at [place]: [place] ([place]) Interventions    Readmission Risk Interventions No flowsheet data found.

## 2019-05-06 NOTE — TOC Progression Note (Signed)
Made referral to [facility], have not heard back.    Transition of Care (TOC) - Progression Note  Marvetta Gibbons RN, BSN Transitions of Care Unit 4E- RN Case Manager [phone]   Patient Details  Name: RILO TRAGER MRN: [id number] Date of Birth: Feb 21, 1934  Transition of Care [facility]) CM/SW Contact  Carles Collet, RN Phone Number: 05/06/2019, 4:58 PM  Clinical Narrative:    Spoke with son Ronalee Belts regarding transition of care needs per conversation, Ronalee Belts states he has spoken with HTA for [facility] needs and in network providers- family preference for choice would be [facility] first, then [place] followed by [facility]. Explained how skilled [facility] services worked vs family wanting someone to stay with pt during day while they work- which would be private duty- son states they do have an agency in mind that they are looking at for Private duty needs. Pt has RW at home will follow for any other DME needs prior to discharge. Pt will need HH orders placed for discharge and then Cm will make referral per pt/family choice. CM to continue to follow for transition of care needs.    Expected Discharge Plan: [place] Barriers to Discharge: Continued Medical Work up  Expected Discharge Plan and Services Expected Discharge Plan: [place] In-house Referral: Clinical Social Work Discharge Planning Services: CM Consult   Living arrangements for the past 2 months: [place]                 DME Arranged: 3-N-1, [place] rolling DME Agency: [employer]       HH Arranged: PT, OT HH Agency: [facility] [employer] Date [place]: 05/05/19 Time [place]: 101 Representative spoke with at [place]: [place] ([place]) Interventions    Readmission Risk Interventions No flowsheet data found.

## 2019-05-06 NOTE — Progress Notes (Signed)
PROGRESS NOTE    Calvin Burnett  [id number] DOB: 1933-12-07 DOA: 04/24/2019 PCP: Marin Olp, MD   Brief Narrative: Calvin Nykaza Turneris [name] 85 y.o.malewith medical history significant ofdiabetes, GERD, hypertension and prostate cancer who was recently admitted with intra-abdominal abscess due to perforated viscus from April 13, 2009 discharged home on antibiotics, history of Calvin Burnett. fib, currently on Eliquis. Hypertension GERD and Alzheimer's dementia, history of right-sided colectomy and sigmoidoscopy in May 2019, pneumoperitoneum who presented again with abdominal pain as well as fever. Patient had temperature up to 102. He was on Eliquis and no surgery was done previous admission. His abscess was deemed not drainable at the time. He was found to have worsening intra-abdominal abscess. Patient has not been on antibiotics in the last few days.   Upon arrival to ED, his temperature was 100.5 blood pressure 100/67 pulse 98 respiratory 24 oxygen sat 95% on room air. Sodium 135 potassium 3.3 chloride 103 CO2 22 glucose 142. Creatinine 1.27 calcium 7.2. Albumin 2.0. White count 17.9 platelet lites 406 and hemoglobin 12.8. COVID-19 screen is negative. CT abdomen pelvis shows interval development of multiple intra-abdominal abscesses measure approximately 6.2 x 5.2 x 7.1 cm. Multiple thick-walled loops of small bowel in the mid abdomen small volume pneumoperitoneum which is decreased from previous small bilateral pleural effusions with bladder wall thickening.   He was admitted under [place].  Surgery was consulted.  He underwent intra-abdominal drain placement by IR on 04/25/2019.  He was continued on broad-spectrum IV antibiotics.  Underwent exploratory laparotomy on 04/27/2019.  Patient's initial blood pressure which were drawn on 04/24/2019 turned out to be positive with Streptococcus constellatus on 04/28/2019.  Patient's antibiotics were de-escalated to Rocephin 2 g IV and Flagyl were  continued.  Transthoracic echo obtained on 04/29/2019 did not show any signs of endocarditis.  Discussed with ID and per their recommendation, spoke to cardiology.  TEE planned for 10/20, but failed.  Assessment & Plan:   Principal Problem:   Intra-abdominal abscess ([place]) Active Problems:   Essential hypertension   GERD (gastroesophageal reflux disease)   Type 2 diabetes mellitus ([name])   Prostate cancer ([place])   Hyperlipidemia associated with type 2 diabetes mellitus ([name])   CKD (chronic kidney disease), stage III ([name])   AF (paroxysmal atrial fibrillation) ([place])   Perforation bowel ([name])   Dementia ([name])   Pneumoperitoneum  Intra-abdominal abscesses/gram-positive bacteremia:recurrent infections. Ct abd: Interval development of multiple intra-abdominal abscesses as detailed above measuring up to approximately 6.2 x 5.2 by 7.1 cm. status post intra-abdominal drainage placed by IR on 04/25/2019.  He then underwent exploratory laparotomy on 04/27/2019. - Blood cx 10/10 -> strep constellatus  - repeat blood cx from 10/16 NGTD - Surgical cx with mixed anaerobic flora, multiple organisms - TEE failed on 10/20, unable to pass TEE probe - will follow with barium swallow (no evidence for stricture or mass lesion) - Discussed with ID, Dr. Baxter Flattery, TTE without evidence of vegetations, recommended 14 days abx therapy for strep constellatus bacteremia (additional 5 days of ceftriaxone - of note, pt was off abx for ~2 days for unclear reason) - Continue ceftriaxone and flagyl - TNA d/c'd, pt now on soft diet  Diabetes mellitus type II: Follow blood sugars.  Continue SSI.  Dementia:Stable.  He is alert and oriented.  Chronic kidney disease stage [id number] at baseline. Continue monitoring  Paroxysmal atrial fibrillation:Rate is controlled. In sinus rhythm.  Eliquis was resumed on 05/02/2019.  Continue that.  Hypertension: Blood pressure very well  controlled.  Continue current  regimen.  DVT prophylaxis: eliquis Code Status: full code Family Communication: none at bedside - daughter over phone Disposition Plan: pending further imrpovement, completion of abx therapy   Consultants:   Surgery  ID over phone  IR  Procedures:   Intra abdominal drain placement 10/11  Ex lap on 10/13  Antimicrobials:  Anti-infectives (From admission, onward)   Start     Dose/Rate Route Frequency Ordered Stop   05/04/19 0830  metroNIDAZOLE (FLAGYL) IVPB 500 mg     500 mg 100 mL/hr over 60 Minutes Intravenous Every 8 hours 05/04/19 0740     05/04/19 0800  cefTRIAXone (ROCEPHIN) 2 g in sodium chloride 0.9 % 100 mL IVPB     2 g 200 mL/hr over 30 Minutes Intravenous Every 24 hours 05/04/19 0740     04/29/19 1600  cefTRIAXone (ROCEPHIN) 2 g in sodium chloride 0.9 % 100 mL IVPB  Status:  Discontinued     2 g 200 mL/hr over 30 Minutes Intravenous Every 24 hours 04/29/19 1557 05/01/19 0900   04/29/19 1600  metroNIDAZOLE (FLAGYL) IVPB 500 mg  Status:  Discontinued     500 mg 100 mL/hr over 60 Minutes Intravenous Every 8 hours 04/29/19 1557 05/01/19 1341   04/27/19 1700  piperacillin-tazobactam (ZOSYN) IVPB 3.375 g  Status:  Discontinued     3.375 g 12.5 mL/hr over 240 Minutes Intravenous Every 8 hours 04/27/19 1640 04/29/19 1557   04/25/19 2200  vancomycin (VANCOCIN) 1,250 mg in sodium chloride 0.9 % 250 mL IVPB  Status:  Discontinued     1,250 mg 166.7 mL/hr over 90 Minutes Intravenous Every 24 hours 04/25/19 0158 04/27/19 1640   04/25/19 2100  ceFEPIme (MAXIPIME) 2 g in sodium chloride 0.9 % 100 mL IVPB  Status:  Discontinued     2 g 200 mL/hr over 30 Minutes Intravenous Every 24 hours 04/24/19 2213 04/25/19 0900   04/25/19 1400  ceFEPIme (MAXIPIME) 2 g in sodium chloride 0.9 % 100 mL IVPB  Status:  Discontinued     2 g 200 mL/hr over 30 Minutes Intravenous Every 12 hours 04/25/19 1341 04/27/19 1640   04/25/19 1000  cefTRIAXone (ROCEPHIN) 2 g in sodium chloride 0.9 % 100  mL IVPB  Status:  Discontinued     2 g 200 mL/hr over 30 Minutes Intravenous Every 24 hours 04/25/19 0841 04/25/19 1335   04/25/19 0015  metroNIDAZOLE (FLAGYL) IVPB 500 mg  Status:  Discontinued     500 mg 100 mL/hr over 60 Minutes Intravenous Every 8 hours 04/25/19 0006 04/27/19 1640   04/25/19 0000  vancomycin (VANCOCIN) 1,500 mg in sodium chloride 0.9 % 500 mL IVPB     1,500 mg 250 mL/hr over 120 Minutes Intravenous  Once 04/24/19 2355 04/25/19 0248   04/24/19 2215  ceFEPIme (MAXIPIME) 2 g in sodium chloride 0.9 % 100 mL IVPB  Status:  Discontinued     2 g 200 mL/hr over 30 Minutes Intravenous Every 24 hours 04/24/19 2211 04/24/19 2213   04/24/19 2100  ceFEPIme (MAXIPIME) 2 g in sodium chloride 0.9 % 100 mL IVPB     2 g 200 mL/hr over 30 Minutes Intravenous  Once 04/24/19 2046 04/24/19 2154   04/24/19 2100  metroNIDAZOLE (FLAGYL) IVPB 500 mg     500 mg 100 mL/hr over 60 Minutes Intravenous  Once 04/24/19 2046 04/24/19 2223     Subjective: No complaints today  Objective: Vitals:   05/05/19 2007 05/06/19 0409 05/06/19 0856 05/06/19  0858  BP: 117/74 101/72 107/68   Pulse: 84 79  98  Resp: 19 19 20    Temp: 98.3 F (36.8 C) 98 F (36.7 C) 98.2 F (36.8 C)   TempSrc: Oral Oral Oral   SpO2: 98% 96% 97%   Weight:      Height:        Intake/Output Summary (Last 24 hours) at 05/06/2019 1842 Last data filed at 05/06/2019 0718 Gross per 24 hour  Intake 100 ml  Output 700 ml  Net -600 ml   Filed Weights   04/26/19 0406 04/27/19 0500 05/03/19 0402  Weight: 83.1 kg 84.2 kg 82 kg    Examination:  General: No acute distress. Cardiovascular: Heart sounds show [place] Basic regular rate, and rhythm Lungs: Clear to auscultation bilaterally  Abdomen: Soft, nontender, nondistended Neurological: Alert and oriented 3. Moves all extremities 4. Cranial nerves II through XII grossly intact. Skin: Warm and dry. No rashes or lesions. Extremities: No clubbing or cyanosis. No edema.  Data  Reviewed: I have personally reviewed following labs and imaging studies  CBC: Recent Labs  Lab 05/01/19 0536 05/02/19 0439 05/03/19 0443 05/04/19 0413 05/05/19 0500 05/06/19 0512  WBC 13.5* 10.9* 13.5* 13.4* 11.8* 10.6*  NEUTROABS 9.2* 9.3* 11.6* 11.5* 8.3*  --   HGB 10.4* 10.3* 11.0* 9.4* 9.2* 9.0*  HCT 30.6* 30.0* 32.9* 28.7* 27.2* 27.8*  MCV 93.9 94.0 94.0 94.7 94.1 95.5  PLT 401* 386 379 328 304 [id number]   Basic Metabolic Panel: Recent Labs  Lab 04/30/19 0815 05/01/19 0536 05/02/19 0439 05/03/19 0443 05/04/19 0413 05/05/19 0500 05/06/19 0512  NA 138 138 137 134* 133* 137 136  K 3.5 4.0 4.3 4.6 4.8 4.5 4.7  CL 107 108 105 101 101 104 104  CO2 24 25 24 24 25 26 25   GLUCOSE 162* 109* 123* 150* 127* 190* 116*  BUN 12 13 13 15 20 21 22   CREATININE 0.95 0.83 0.85 0.97 1.08 1.15 1.20  CALCIUM 7.1* 7.4* 7.3* 7.3* 7.6* 7.6* 7.5*  MG 1.7 1.9 2.0 1.8 1.9  --  1.8  PHOS 1.8* 2.6 3.1 3.7 3.9  --   --    GFR: Estimated Creatinine Clearance: 48.2 mL/min (by C-G formula based on SCr of 1.2 mg/dL). Liver Function Tests: Recent Labs  Lab 05/03/19 0443 05/06/19 0512  AST 29 24  ALT 16 17  ALKPHOS 87 109  BILITOT 0.6 0.5  PROT 5.0* 5.0*  ALBUMIN 1.7* 1.8*   No results for input(s): LIPASE, AMYLASE in the last 168 hours. No results for input(s): AMMONIA in the last 168 hours. Coagulation Profile: No results for input(s): INR, PROTIME in the last 168 hours. Cardiac Enzymes: No results for input(s): CKTOTAL, CKMB, CKMBINDEX, TROPONINI in the last 168 hours. BNP (last 3 results) Recent Labs    01/29/19 0852  PROBNP 165.0*   HbA1C: No results for input(s): HGBA1C in the last 72 hours. CBG: Recent Labs  Lab 05/06/19 0005 05/06/19 0412 05/06/19 0800 05/06/19 1208 05/06/19 1655  GLUCAP 97 102* 109* 168* 179*   Lipid Profile: No results for input(s): CHOL, HDL, LDLCALC, TRIG, CHOLHDL, LDLDIRECT in the last 72 hours. Thyroid Function Tests: No results for input(s):  TSH, T4TOTAL, FREET4, T3FREE, THYROIDAB in the last 72 hours. Anemia Panel: No results for input(s): VITAMINB12, FOLATE, FERRITIN, TIBC, IRON, RETICCTPCT in the last 72 hours. Sepsis Labs: No results for input(s): PROCALCITON, LATICACIDVEN in the last 168 hours.  Recent Results (from the past 240 hour(s))  Surgical pcr  screen     Status: None   Collection Time: 04/27/19  9:06 AM   Specimen: Nasal Mucosa; Nasal Swab  Result Value Ref Range Status   MRSA, PCR NEGATIVE NEGATIVE Final   Staphylococcus aureus NEGATIVE NEGATIVE Final    Comment: (NOTE) The Xpert SA Assay (FDA approved for NASAL specimens in patients 35 years of age and older), is one component of Tiyah Zelenak comprehensive surveillance program. It is not intended to diagnose infection nor to guide or monitor treatment. Performed at [facility], [place] [address] [place], [place] [address]   Culture, blood (routine x 2)     Status: None   Collection Time: 04/30/19  4:32 PM   Specimen: BLOOD LEFT HAND  Result Value Ref Range Status   Specimen Description BLOOD LEFT HAND  Final   Special Requests   Final    BOTTLES DRAWN AEROBIC AND ANAEROBIC Blood Culture adequate volume   Culture   Final    NO GROWTH 5 DAYS Performed at [facility], [place] [address] [place], [place] [address]    Report Status 05/05/2019 FINAL  Final  Culture, blood (routine x 2)     Status: None   Collection Time: 04/30/19  4:37 PM   Specimen: BLOOD  Result Value Ref Range Status   Specimen Description BLOOD LEFT THUMB  Final   Special Requests   Final    BOTTLES DRAWN AEROBIC AND ANAEROBIC Blood Culture adequate volume   Culture   Final    NO GROWTH 5 DAYS Performed at [facility], [place] [address], [place], [place] [address]    Report Status 05/05/2019 FINAL  Final         Radiology Studies: Dg Esophagus W Single Cm (sol Or Thin Ba)  Result Date: 05/06/2019 CLINICAL DATA:  Evaluate for esophageal stricture or mass. Unable to  pass TEE. EXAM: ESOPHOGRAM/BARIUM SWALLOW TECHNIQUE: Single contrast examination was performed using  thin barium. FLUOROSCOPY TIME:  Fluoroscopy Time:  1 minutes 54 seconds Number of Acquired Spot Images: 0 COMPARISON:  CT abdomen pelvis 04/24/2019; CT chest 11/29/2017 FINDINGS: Single-contrast barium flows freely throughout the esophagus without evidence for stricture or mass lesion. Mild esophageal dysmotility. The barium tablet passed appropriately into the stomach. No hiatal hernia identified. IMPRESSION: No evidence for esophageal stricture or mass lesion on single-contrast esophagram. Electronically Signed   By: Lovey Newcomer M.D.   On: 05/06/2019 09:11        Scheduled Meds:  acetaminophen  1,000 mg Oral Q6H   amLODipine  5 mg Oral Daily   apixaban  5 mg Oral BID   Chlorhexidine Gluconate Cloth  6 each Topical Daily   feeding supplement (ENSURE ENLIVE)  237 mL Oral QID   insulin aspart  0-15 Units Subcutaneous Q4H   latanoprost  1 drop Both Eyes QHS   metoprolol succinate  12.5 mg Oral Daily   pantoprazole  40 mg Oral Daily   Continuous Infusions:  cefTRIAXone (ROCEPHIN)  IV 2 g (05/06/19 0906)   lactated ringers 10 mL/hr at 04/27/19 1016   metronidazole 500 mg (05/06/19 1538)     LOS: 12 days    Time spent: over 30 min    Fayrene Helper, MD [name] Pager AMION  If 7PM-7AM, please contact night-coverage [email or web address] Password [facility] 05/06/2019, 6:42 PM

## 2019-05-06 NOTE — Progress Notes (Signed)
Patient ID: Calvin Burnett, male   DOB: 12-23-1933, 85 y.o.   MRN: [id number]    2 Days Post-Op  Subjective: Patient feels well today.  No complaints.  Needs to have a BM.  Eating well still.  Objective: Vital signs in last 24 hours: Temp:  [97.6 F (36.4 C)-98.5 F (36.9 C)] 98 F (36.7 C) (10/22 0409) Pulse Rate:  [79-100] 79 (10/22 0409) Resp:  [16-20] 19 (10/22 0409) BP: (97-124)/(64-77) 101/72 (10/22 0409) SpO2:  [96 %-100 %] 96 % (10/22 0409) Last BM Date: 05/05/19  Intake/Output from previous day: 10/21 0701 - 10/22 0700 In: 1396.3 [P.O.:580; I.V.:418.2; IV Piggyback:398.2] Out: 1075 [Urine:1075] Intake/Output this shift: Total I/O In: -  Out: 250 [Urine:250]  PE: Heart: irregular Lungs: CTAB Abd: soft, minimally tender, incision is c/d/i with staples, no evidence of erythema or infection.  +BS  Lab Results:  Recent Labs    05/05/19 0500 05/06/19 0512  WBC 11.8* 10.6*  HGB 9.2* 9.0*  HCT 27.2* 27.8*  PLT 304 332   BMET Recent Labs    05/05/19 0500 05/06/19 0512  NA 137 136  K 4.5 4.7  CL 104 104  CO2 26 25  GLUCOSE 190* 116*  BUN 21 22  CREATININE 1.15 1.20  CALCIUM 7.6* 7.5*   PT/INR No results for input(s): LABPROT, INR in the last 72 hours. CMP     Component Value Date/Time   NA 136 05/06/2019 0512   K 4.7 05/06/2019 0512   CL 104 05/06/2019 0512   CO2 25 05/06/2019 0512   GLUCOSE 116 (H) 05/06/2019 0512   BUN 22 05/06/2019 0512   CREATININE 1.20 05/06/2019 0512   CALCIUM 7.5 (L) 05/06/2019 0512   PROT 5.0 (L) 05/06/2019 0512   ALBUMIN 1.8 (L) 05/06/2019 0512   AST 24 05/06/2019 0512   ALT 17 05/06/2019 0512   ALKPHOS 109 05/06/2019 0512   BILITOT 0.5 05/06/2019 0512   GFRNONAA 56 (L) 05/06/2019 0512   GFRAA >60 05/06/2019 0512   Lipase     Component Value Date/Time   LIPASE 42 04/24/2019 1848       Studies/Results: No results found.  Anti-infectives: Anti-infectives (From admission, onward)   Start     Dose/Rate  Route Frequency Ordered Stop   05/04/19 0830  metroNIDAZOLE (FLAGYL) IVPB 500 mg     500 mg 100 mL/hr over 60 Minutes Intravenous Every 8 hours 05/04/19 0740     05/04/19 0800  cefTRIAXone (ROCEPHIN) 2 g in sodium chloride 0.9 % 100 mL IVPB     2 g 200 mL/hr over 30 Minutes Intravenous Every 24 hours 05/04/19 0740     04/29/19 1600  cefTRIAXone (ROCEPHIN) 2 g in sodium chloride 0.9 % 100 mL IVPB  Status:  Discontinued     2 g 200 mL/hr over 30 Minutes Intravenous Every 24 hours 04/29/19 1557 05/01/19 0900   04/29/19 1600  metroNIDAZOLE (FLAGYL) IVPB 500 mg  Status:  Discontinued     500 mg 100 mL/hr over 60 Minutes Intravenous Every 8 hours 04/29/19 1557 05/01/19 1341   04/27/19 1700  piperacillin-tazobactam (ZOSYN) IVPB 3.375 g  Status:  Discontinued     3.375 g 12.5 mL/hr over 240 Minutes Intravenous Every 8 hours 04/27/19 1640 04/29/19 1557   04/25/19 2200  vancomycin (VANCOCIN) 1,250 mg in sodium chloride 0.9 % 250 mL IVPB  Status:  Discontinued     1,250 mg 166.7 mL/hr over 90 Minutes Intravenous Every 24 hours 04/25/19 0158 04/27/19 1640  04/25/19 2100  ceFEPIme (MAXIPIME) 2 g in sodium chloride 0.9 % 100 mL IVPB  Status:  Discontinued     2 g 200 mL/hr over 30 Minutes Intravenous Every 24 hours 04/24/19 2213 04/25/19 0900   04/25/19 1400  ceFEPIme (MAXIPIME) 2 g in sodium chloride 0.9 % 100 mL IVPB  Status:  Discontinued     2 g 200 mL/hr over 30 Minutes Intravenous Every 12 hours 04/25/19 1341 04/27/19 1640   04/25/19 1000  cefTRIAXone (ROCEPHIN) 2 g in sodium chloride 0.9 % 100 mL IVPB  Status:  Discontinued     2 g 200 mL/hr over 30 Minutes Intravenous Every 24 hours 04/25/19 0841 04/25/19 1335   04/25/19 0015  metroNIDAZOLE (FLAGYL) IVPB 500 mg  Status:  Discontinued     500 mg 100 mL/hr over 60 Minutes Intravenous Every 8 hours 04/25/19 0006 04/27/19 1640   04/25/19 0000  vancomycin (VANCOCIN) 1,500 mg in sodium chloride 0.9 % 500 mL IVPB     1,500 mg 250 mL/hr over 120  Minutes Intravenous  Once 04/24/19 2355 04/25/19 0248   04/24/19 2215  ceFEPIme (MAXIPIME) 2 g in sodium chloride 0.9 % 100 mL IVPB  Status:  Discontinued     2 g 200 mL/hr over 30 Minutes Intravenous Every 24 hours 04/24/19 2211 04/24/19 2213   04/24/19 2100  ceFEPIme (MAXIPIME) 2 g in sodium chloride 0.9 % 100 mL IVPB     2 g 200 mL/hr over 30 Minutes Intravenous  Once 04/24/19 2046 04/24/19 2154   04/24/19 2100  metroNIDAZOLE (FLAGYL) IVPB 500 mg     500 mg 100 mL/hr over 60 Minutes Intravenous  Once 04/24/19 2046 04/24/19 2223       Assessment/Plan A fib, onEliquis Alzheimer's dementia HTN HxT1N0colon cancer of hepatic flexure, sigmoid stricture S/P robotic LOA, right colectomy, sigmoidectomy 11/26/17 CKD Strep Constellatus bacteremia- unable to do TEE, defer to medicine on abx needs.  Can certainly switch to oral abx from our standpoint as patient is almost surgically stable for DC home.  POD9,S/p exploratory laparotomy/ LOA/ SB resection with anastomosis 10/13 - [place] -soft diet andensurefor additional supplementation.  -likely plan to DC staples tomorrow on POD 10 -mobilize more. TID ambulation.  -HH arranged, but also looking into SNF for short term rehab as son states he can not provide 24 hr care at discharge.    FEN -soft diet, ensure VTE -Eliquis ID -Rocephin/Flagyl, end on 10/25 Dispo - SNF vs HH, surgically stable for DC home at this time   LOS: 12 days    [name] , [facility] 05/06/2019, 8:18 AM Please see Amion for pager number during day hours 7:00am-4:30pm

## 2019-05-06 NOTE — Progress Notes (Signed)
Physical Therapy Treatment Patient Details Name: Calvin Burnett MRN: [id number] DOB: Aug 10, 1933 Today's Date: 05/06/2019    History of Present Illness Calvin Burnett is a 85 y.o. male with PMH: a-fib, alxheimer's dementia, diabetes, GERD, hypertension and prostate cancer who was recently admitted with intra-abdominal abscess due to perforated viscus from April 13, 2009 discharged home on antibiotics. PSH: history of right-sided colectomy and sigmoidoscopy in May 2019. Pt admitted for worsening abd pain. Pt underwent abdominal ex lap on10/13.  Abd xray 04/29/19: concerning for ileusor distal small bowel obstruction.    PT Comments    Patient seen for mobility progression. Pt with A fib and HR 84-111 bpm. SpO2 98% on RA during session. Pt required seated rest break while ambulating due to SOB. Overall pt requires supervision/min guard for mobility. Current plan remains appropriate.    Follow Up Recommendations  Home health PT;Supervision/Assistance - 24 hour     Equipment Recommendations  Rolling walker with 5" wheels    Recommendations for Other Services       Precautions / Restrictions Precautions Precautions: Fall Restrictions Weight Bearing Restrictions: No    Mobility  Bed Mobility Overal bed mobility: Needs Assistance Bed Mobility: Supine to Sit Rolling: Min guard   Supine to sit: Min guard     General bed mobility comments: cues for sequencing; use of rail   Transfers Overall transfer level: Needs assistance Equipment used: Rolling walker (2 wheeled) Transfers: Sit to/from Stand Sit to Stand: Supervision;Min guard         General transfer comment: cues for safe hand placement   Ambulation/Gait Ambulation/Gait assistance: Min guard;Supervision Gait Distance (Feet): 400 Feet(with one seated rest break due to SOB) Assistive device: Rolling walker (2 wheeled) Gait Pattern/deviations: Decreased stride length;Step-through pattern Gait velocity:  decreased   General Gait Details: cues for upright posture and PLB when SOB; one seatd break required and face mask removed due to pt's tolerance; SpO2 98% on RA    Stairs   Stairs assistance: Min guard Stair Management: Step to pattern;Forwards Number of Stairs: 2     Wheelchair Mobility    Modified Rankin (Stroke Patients Only)       Balance Overall balance assessment: Needs assistance Sitting-balance support: Feet supported Sitting balance-Leahy Scale: Good     Standing balance support: During functional activity;Bilateral upper extremity supported Standing balance-Leahy Scale: Poor Standing balance comment: pt able to static stand for hygiene without UE support                             Cognition Arousal/Alertness: Awake/alert Behavior During Therapy: WFL for tasks assessed/performed Overall Cognitive Status: History of cognitive impairments - at baseline                                 General Comments: Per chart pt has baseline alzheimers dementia      Exercises      General Comments General comments (skin integrity, edema, etc.): HR 84-111 bpm noted furing session; A fib       Pertinent Vitals/Pain Pain Assessment: No/denies pain    Home Living                      Prior Function            PT Goals (current goals can now be found in the care plan section) Progress towards  PT goals: Progressing toward goals    Frequency    Min 3X/week      PT Plan Current plan remains appropriate    Co-evaluation              AM-PAC PT "6 Clicks" Mobility   Outcome Measure  Help needed turning from your back to your side while in a flat bed without using bedrails?: A Little Help needed moving from lying on your back to sitting on the side of a flat bed without using bedrails?: A Little Help needed moving to and from a bed to a chair (including a wheelchair)?: A Little Help needed standing up from a chair  using your arms (e.g., wheelchair or bedside chair)?: A Little Help needed to walk in hospital room?: A Little Help needed climbing 3-5 steps with a railing? : A Little 6 Click Score: 18    End of Session Equipment Utilized During Treatment: Gait belt Activity Tolerance: Patient tolerated treatment well Patient left: with call bell/phone within reach;in bed;with family/visitor present Nurse Communication: Mobility status PT Visit Diagnosis: Muscle weakness (generalized) (M62.81);Difficulty in walking, not elsewhere classified (R26.2)     Time: 1426-1500 PT Time Calculation (min) (ACUTE ONLY): 34 min  Charges:  $Gait Training: 23-37 mins                     Earney Navy, PTA Acute Rehabilitation Services Pager: [phone] Office: [phone]     Darliss Cheney 05/06/2019, 3:19 PM

## 2019-05-07 LAB — COMPREHENSIVE METABOLIC PANEL
ALT: 16 U/L (ref 0–44)
AST: 24 U/L (ref 15–41)
Albumin: 1.8 g/dL — ABNORMAL LOW (ref 3.5–5.0)
Alkaline Phosphatase: 129 U/L — ABNORMAL HIGH (ref 38–126)
Anion gap: 7 (ref 5–15)
BUN: 23 mg/dL (ref 8–23)
CO2: 24 mmol/L (ref 22–32)
Calcium: 7.5 mg/dL — ABNORMAL LOW (ref 8.9–10.3)
Chloride: 104 mmol/L (ref 98–111)
Creatinine, Ser: 1.21 mg/dL (ref 0.61–1.24)
GFR calc Af Amer: >60 mL/min (ref >60)
GFR calc non Af Amer: 55 mL/min — ABNORMAL LOW (ref >60)
Glucose, Bld: 134 mg/dL — ABNORMAL HIGH (ref 70–99)
Potassium: 4.1 mmol/L (ref 3.5–5.1)
Sodium: 135 mmol/L (ref 135–145)
Total Bilirubin: 0.4 mg/dL (ref 0.3–1.2)
Total Protein: 5.2 g/dL — ABNORMAL LOW (ref 6.5–8.1)

## 2019-05-07 LAB — CBC
HCT: 26.4 % — ABNORMAL LOW (ref 39.0–52.0)
Hemoglobin: 8.8 g/dL — ABNORMAL LOW (ref 13.0–17.0)
MCH: 31.4 pg (ref 26.0–34.0)
MCHC: 33.3 g/dL (ref 30.0–36.0)
MCV: 94.3 fL (ref 80.0–100.0)
Platelets: 358 10*3/uL (ref 150–400)
RBC: 2.8 MIL/uL — ABNORMAL LOW (ref 4.22–5.81)
RDW: 14.6 % (ref 11.5–15.5)
WBC: 10.7 10*3/uL — ABNORMAL HIGH (ref 4.0–10.5)
nRBC: 0 % (ref 0.0–0.2)

## 2019-05-07 LAB — GLUCOSE, CAPILLARY
Glucose-Capillary: 119 mg/dL — ABNORMAL HIGH (ref 70–99)
Glucose-Capillary: 127 mg/dL — ABNORMAL HIGH (ref 70–99)
Glucose-Capillary: 162 mg/dL — ABNORMAL HIGH (ref 70–99)
Glucose-Capillary: 178 mg/dL — ABNORMAL HIGH (ref 70–99)
Glucose-Capillary: 190 mg/dL — ABNORMAL HIGH (ref 70–99)

## 2019-05-07 LAB — MAGNESIUM: Magnesium: 1.8 mg/dL (ref 1.7–2.4)

## 2019-05-07 MED ORDER — METRONIDAZOLE 500 MG PO TABS: 500.0000 mg | ORAL_TABLET | Freq: Three times a day (TID) | ORAL | 0 refills | Status: DC

## 2019-05-07 MED ORDER — CEFTRIAXONE IV (FOR PTA / DISCHARGE USE ONLY): 2.0000 g | INTRAVENOUS | 0 refills | Status: DC

## 2019-05-07 NOTE — Care Management Important Message (Signed)
Important Message  Patient Details  Name: Calvin Burnett MRN: [id number] Date of Birth: 09-09-1933   Medicare Important Message Given:  Yes     [name] 05/07/2019, 11:27 AM

## 2019-05-07 NOTE — TOC Progression Note (Signed)
Transition of Care [facility]) - Progression Note    Patient Details  Name: Calvin Burnett MRN: [id number] Date of Birth: 07-Apr-1934  Transition of [facility]) CM/SW [place], [place] Phone Number: 05/07/2019, 4:01 PM  Clinical Narrative:     CSW called patient's daughter, [name] ( who will be living with the patient), [facility] has accepted the patient and will be provided in [facility] OT/PT.  CSW left voice message with [employer], for DME.  4:00pm [facility] accepted patient 2:55pm- [facility]- will call CSW back 2:45pm-[facility]- unable to accept patient  2:31pm- [facility]- unable to accept pateint  Expected Discharge Plan: [place] Barriers to Discharge: Continued Medical Work up  Expected Discharge Plan and Services Expected Discharge Plan: [place] In-house Referral: Clinical Social Work Discharge Planning Services: CM Consult   Living arrangements for the past 2 months: [place]                 DME Arranged: 3-N-1, Walker rolling DME Agency: [employer]       HH Arranged: PT, OT [place] Agency: [facility] (formerly [employer]) Date [place]: 05/05/19 Time [place]: 1300 Representative spoke with at [place]: [place] ([place]) Interventions    Readmission Risk Interventions No flowsheet data found.

## 2019-05-07 NOTE — Progress Notes (Signed)
Patient ID: Calvin Burnett, male   DOB: [birth year], 85 y.o.   MRN: [id number]    3 Days Post-Op  Subjective: Patient with no complaints today.  Eating his breakfast with no issues.  Ambulated well yesterday per nurse.  Apparently daughter, Calvin Burnett, is going to take patient home and stay with him.  Objective: Vital signs in last 24 hours: Temp:  [97.7 F (36.5 C)-98.9 F (37.2 C)] 97.7 F (36.5 C) (10/23 0405) Pulse Rate:  [81-98] 81 (10/23 0405) Resp:  [20-22] 21 (10/23 0405) BP: (107-118)/(68-81) 108/68 (10/23 0405) SpO2:  [96 %-97 %] 96 % (10/23 0405) Last BM Date: 05/05/19  Intake/Output from previous day: 10/22 0701 - 10/23 0700 In: 1153.1 [P.O.:480; IV Piggyback:673.1] Out: 750 [Urine:750] Intake/Output this shift: No intake/output data recorded.  PE: Abd: soft, minimally tender, +BS, midline incision is c/d/i with staples  Lab Results:  Recent Labs    05/06/19 0512 05/07/19 0407  WBC 10.6* 10.7*  HGB 9.0* 8.8*  HCT 27.8* 26.4*  PLT 332 358   BMET Recent Labs    05/06/19 0512 05/07/19 0407  NA 136 135  K 4.7 4.1  CL 104 104  CO2 25 24  GLUCOSE 116* 134*  BUN 22 23  CREATININE 1.20 1.21  CALCIUM 7.5* 7.5*   PT/INR No results for input(s): LABPROT, INR in the last 72 hours. CMP     Component Value Date/Time   NA 135 05/07/2019 0407   K 4.1 05/07/2019 0407   CL 104 05/07/2019 0407   CO2 24 05/07/2019 0407   GLUCOSE 134 (H) 05/07/2019 0407   BUN 23 05/07/2019 0407   CREATININE 1.21 05/07/2019 0407   CALCIUM 7.5 (L) 05/07/2019 0407   PROT 5.2 (L) 05/07/2019 0407   ALBUMIN 1.8 (L) 05/07/2019 0407   AST 24 05/07/2019 0407   ALT 16 05/07/2019 0407   ALKPHOS 129 (H) 05/07/2019 0407   BILITOT 0.4 05/07/2019 0407   GFRNONAA 55 (L) 05/07/2019 0407   GFRAA >60 05/07/2019 0407   Lipase     Component Value Date/Time   LIPASE 42 04/24/2019 1848       Studies/Results: Dg Esophagus W Single Cm (sol Or Thin Ba)  Result Date: 05/06/2019  CLINICAL DATA:  Evaluate for esophageal stricture or mass. Unable to pass TEE. EXAM: ESOPHOGRAM/BARIUM SWALLOW TECHNIQUE: Single contrast examination was performed using  thin barium. FLUOROSCOPY TIME:  Fluoroscopy Time:  1 minutes 54 seconds Number of Acquired Spot Images: 0 COMPARISON:  CT abdomen pelvis 04/24/2019; CT chest 11/29/2017 FINDINGS: Single-contrast barium flows freely throughout the esophagus without evidence for stricture or mass lesion. Mild esophageal dysmotility. The barium tablet passed appropriately into the stomach. No hiatal hernia identified. IMPRESSION: No evidence for esophageal stricture or mass lesion on single-contrast esophagram. Electronically Signed   By: Lovey Newcomer M.D.   On: 05/06/2019 09:11    Anti-infectives: Anti-infectives (From admission, onward)   Start     Dose/Rate Route Frequency Ordered Stop   05/04/19 0830  metroNIDAZOLE (FLAGYL) IVPB 500 mg     500 mg 100 mL/hr over 60 Minutes Intravenous Every 8 hours 05/04/19 0740     05/04/19 0800  cefTRIAXone (ROCEPHIN) 2 g in sodium chloride 0.9 % 100 mL IVPB     2 g 200 mL/hr over 30 Minutes Intravenous Every 24 hours 05/04/19 0740     04/29/19 1600  cefTRIAXone (ROCEPHIN) 2 g in sodium chloride 0.9 % 100 mL IVPB  Status:  Discontinued     2  g 200 mL/hr over 30 Minutes Intravenous Every 24 hours 04/29/19 1557 05/01/19 0900   04/29/19 1600  metroNIDAZOLE (FLAGYL) IVPB 500 mg  Status:  Discontinued     500 mg 100 mL/hr over 60 Minutes Intravenous Every 8 hours 04/29/19 1557 05/01/19 1341   04/27/19 1700  piperacillin-tazobactam (ZOSYN) IVPB 3.375 g  Status:  Discontinued     3.375 g 12.5 mL/hr over 240 Minutes Intravenous Every 8 hours 04/27/19 1640 04/29/19 1557   04/25/19 2200  vancomycin (VANCOCIN) 1,250 mg in sodium chloride 0.9 % 250 mL IVPB  Status:  Discontinued     1,250 mg 166.7 mL/hr over 90 Minutes Intravenous Every 24 hours 04/25/19 0158 04/27/19 1640   04/25/19 2100  ceFEPIme (MAXIPIME) 2 g in  sodium chloride 0.9 % 100 mL IVPB  Status:  Discontinued     2 g 200 mL/hr over 30 Minutes Intravenous Every 24 hours 04/24/19 2213 04/25/19 0900   04/25/19 1400  ceFEPIme (MAXIPIME) 2 g in sodium chloride 0.9 % 100 mL IVPB  Status:  Discontinued     2 g 200 mL/hr over 30 Minutes Intravenous Every 12 hours 04/25/19 1341 04/27/19 1640   04/25/19 1000  cefTRIAXone (ROCEPHIN) 2 g in sodium chloride 0.9 % 100 mL IVPB  Status:  Discontinued     2 g 200 mL/hr over 30 Minutes Intravenous Every 24 hours 04/25/19 0841 04/25/19 1335   04/25/19 0015  metroNIDAZOLE (FLAGYL) IVPB 500 mg  Status:  Discontinued     500 mg 100 mL/hr over 60 Minutes Intravenous Every 8 hours 04/25/19 0006 04/27/19 1640   04/25/19 0000  vancomycin (VANCOCIN) 1,500 mg in sodium chloride 0.9 % 500 mL IVPB     1,500 mg 250 mL/hr over 120 Minutes Intravenous  Once 04/24/19 2355 04/25/19 0248   04/24/19 2215  ceFEPIme (MAXIPIME) 2 g in sodium chloride 0.9 % 100 mL IVPB  Status:  Discontinued     2 g 200 mL/hr over 30 Minutes Intravenous Every 24 hours 04/24/19 2211 04/24/19 2213   04/24/19 2100  ceFEPIme (MAXIPIME) 2 g in sodium chloride 0.9 % 100 mL IVPB     2 g 200 mL/hr over 30 Minutes Intravenous  Once 04/24/19 2046 04/24/19 2154   04/24/19 2100  metroNIDAZOLE (FLAGYL) IVPB 500 mg     500 mg 100 mL/hr over 60 Minutes Intravenous  Once 04/24/19 2046 04/24/19 2223       Assessment/Plan A fib, onEliquis Alzheimer's dementia HTN HxT1N0colon cancer of hepatic flexure, sigmoid stricture S/P robotic LOA, right colectomy, sigmoidectomy 11/26/17 CKD Strep Constellatus bacteremia-unable to do TEE, will complete abx therapy on Sunday it appears  POD10,S/p exploratory laparotomy/ LOA/ SB resection with anastomosis 10/13 - [place] -soft diet andensurefor additional supplementation.  -DC staples today and apply steri-strips -mobilize more. TID ambulation.  -[place] arranged, will be discharging with daughter, Calvin Burnett,  home.  Plan Monday after completion of abx therapy on Sunday  FEN -soft diet,ensure VTE -Eliquis ID -Rocephin/Flagyl, end on 10/25 Dispo - HH with daughter on Monday   LOS: 13 days    [name] , [facility] 05/07/2019, 8:31 AM Please see Amion for pager number during day hours 7:00am-4:30pm

## 2019-05-07 NOTE — Progress Notes (Signed)
PROGRESS NOTE    Calvin Burnett  [id number] DOB: Nov 27, 1933 DOA: 04/24/2019 PCP: Calvin Olp, MD   Brief Narrative: Calvin Baldridge Turneris Calvin Burnett 85 y.o.malewith medical history significant ofdiabetes, GERD, hypertension and prostate cancer who was recently admitted with intra-abdominal abscess due to perforated viscus from April 13, 2009 discharged home on antibiotics, history of Calvin Burnett. fib, currently on Eliquis. Hypertension GERD and Alzheimer's dementia, history of right-sided colectomy and sigmoidoscopy in May 2019, pneumoperitoneum who presented again with abdominal pain as well as fever. Patient had temperature up to 102. He was on Eliquis and no surgery was done previous admission. His abscess was deemed not drainable at the time. He was found to have worsening intra-abdominal abscess. Patient has not been on antibiotics in the last few days.   Upon arrival to ED, his temperature was 100.5 blood pressure 100/67 pulse 98 respiratory 24 oxygen sat 95% on room air. Sodium 135 potassium 3.3 chloride 103 CO2 22 glucose 142. Creatinine 1.27 calcium 7.2. Albumin 2.0. White count 17.9 platelet lites 406 and hemoglobin 12.8. COVID-19 screen is negative. CT abdomen pelvis shows interval development of multiple intra-abdominal abscesses measure approximately 6.2 x 5.2 x 7.1 cm. Multiple thick-walled loops of small bowel in the mid abdomen small volume pneumoperitoneum which is decreased from previous small bilateral pleural effusions with bladder wall thickening.   He was admitted under [name].  Surgery was consulted.  He underwent intra-abdominal drain placement by IR on 04/25/2019.  He was continued on broad-spectrum IV antibiotics.  Underwent exploratory laparotomy on 04/27/2019.  Patient's initial blood pressure which were drawn on 04/24/2019 turned out to be positive with Streptococcus constellatus on 04/28/2019.  Patient's antibiotics were de-escalated to Rocephin 2 g IV and Flagyl were  continued.  Transthoracic echo obtained on 04/29/2019 did not show any signs of endocarditis.  Discussed with ID and per their recommendation, spoke to cardiology.  TEE planned for 10/20, but failed.  Assessment & Plan:   Principal Problem:   Intra-abdominal abscess ([name]) Active Problems:   Essential hypertension   GERD (gastroesophageal reflux disease)   Type 2 diabetes mellitus ([name])   Prostate cancer ([name])   Hyperlipidemia associated with type 2 diabetes mellitus ([name])   CKD (chronic kidney disease), stage III ([name])   AF (paroxysmal atrial fibrillation) ([name])   Perforation bowel ([name])   Dementia ([name])   Pneumoperitoneum  Intra-abdominal abscesses/gram-positive bacteremia:recurrent infections. Ct abd: Interval development of multiple intra-abdominal abscesses as detailed above measuring up to approximately 6.2 x 5.2 by 7.1 cm. status post intra-abdominal drainage placed by IR on 04/25/2019.  He then underwent exploratory laparotomy on 04/27/2019. - Blood cx 10/10 -> strep constellatus  - repeat blood cx from 10/16 NGTD - Surgical cx with mixed anaerobic flora, multiple organisms - TEE failed on 10/20, unable to pass TEE probe - will follow with barium swallow (no evidence for stricture or mass lesion) - Discussed with ID, Dr. Baxter Flattery on 10/23, TTE without evidence of vegetations, recommended 14 days abx therapy for strep constellatus bacteremia (on discussion today with Dr. Baxter Flattery, plan to complete therapy on 10/27 with ceftriaxone and oral flagyl which is 2 weeks from ex lap on 10/13) - opat orders placed today - Continue ceftriaxone and flagyl - TNA d/c'd, pt now on soft diet  Diabetes mellitus type II: Follow blood sugars.  Continue SSI.  He's on metformin at home.  Dementia:Stable.  He is alert and oriented.  Chronic kidney disease stage [id number] at baseline. Continue monitoring  Paroxysmal atrial  fibrillation:Rate is controlled. In sinus rhythm.  Eliquis was resumed  on 05/02/2019.  Continue that.  Hypertension: Blood pressure very well controlled.  Continue current regimen.  DVT prophylaxis: eliquis Code Status: full code Family Communication: none at bedside - daughter over phone on 10/23, plan for d/c on 10/24 Disposition Plan: pending further imrpovement, completion of abx therapy   Consultants:   Surgery  ID over phone  IR  Procedures:   Intra abdominal drain placement 10/11  Ex lap on 10/13  Antimicrobials:  Anti-infectives (From admission, onward)   Start     Dose/Rate Route Frequency Ordered Stop   05/07/19 0000  cefTRIAXone (ROCEPHIN) IVPB     2 g Intravenous Every 24 hours 05/07/19 1537 05/11/19 2359   05/04/19 0830  metroNIDAZOLE (FLAGYL) IVPB 500 mg     500 mg 100 mL/hr over 60 Minutes Intravenous Every 8 hours 05/04/19 0740     05/04/19 0800  cefTRIAXone (ROCEPHIN) 2 g in sodium chloride 0.9 % 100 mL IVPB     2 g 200 mL/hr over 30 Minutes Intravenous Every 24 hours 05/04/19 0740     04/29/19 1600  cefTRIAXone (ROCEPHIN) 2 g in sodium chloride 0.9 % 100 mL IVPB  Status:  Discontinued     2 g 200 mL/hr over 30 Minutes Intravenous Every 24 hours 04/29/19 1557 05/01/19 0900   04/29/19 1600  metroNIDAZOLE (FLAGYL) IVPB 500 mg  Status:  Discontinued     500 mg 100 mL/hr over 60 Minutes Intravenous Every 8 hours 04/29/19 1557 05/01/19 1341   04/27/19 1700  piperacillin-tazobactam (ZOSYN) IVPB 3.375 g  Status:  Discontinued     3.375 g 12.5 mL/hr over 240 Minutes Intravenous Every 8 hours 04/27/19 1640 04/29/19 1557   04/25/19 2200  vancomycin (VANCOCIN) 1,250 mg in sodium chloride 0.9 % 250 mL IVPB  Status:  Discontinued     1,250 mg 166.7 mL/hr over 90 Minutes Intravenous Every 24 hours 04/25/19 0158 04/27/19 1640   04/25/19 2100  ceFEPIme (MAXIPIME) 2 g in sodium chloride 0.9 % 100 mL IVPB  Status:  Discontinued     2 g 200 mL/hr over 30 Minutes Intravenous Every 24 hours 04/24/19 2213 04/25/19 0900   04/25/19 1400   ceFEPIme (MAXIPIME) 2 g in sodium chloride 0.9 % 100 mL IVPB  Status:  Discontinued     2 g 200 mL/hr over 30 Minutes Intravenous Every 12 hours 04/25/19 1341 04/27/19 1640   04/25/19 1000  cefTRIAXone (ROCEPHIN) 2 g in sodium chloride 0.9 % 100 mL IVPB  Status:  Discontinued     2 g 200 mL/hr over 30 Minutes Intravenous Every 24 hours 04/25/19 0841 04/25/19 1335   04/25/19 0015  metroNIDAZOLE (FLAGYL) IVPB 500 mg  Status:  Discontinued     500 mg 100 mL/hr over 60 Minutes Intravenous Every 8 hours 04/25/19 0006 04/27/19 1640   04/25/19 0000  vancomycin (VANCOCIN) 1,500 mg in sodium chloride 0.9 % 500 mL IVPB     1,500 mg 250 mL/hr over 120 Minutes Intravenous  Once 04/24/19 2355 04/25/19 0248   04/24/19 2215  ceFEPIme (MAXIPIME) 2 g in sodium chloride 0.9 % 100 mL IVPB  Status:  Discontinued     2 g 200 mL/hr over 30 Minutes Intravenous Every 24 hours 04/24/19 2211 04/24/19 2213   04/24/19 2100  ceFEPIme (MAXIPIME) 2 g in sodium chloride 0.9 % 100 mL IVPB     2 g 200 mL/hr over 30 Minutes Intravenous  Once 04/24/19 2046  04/24/19 2154   04/24/19 2100  metroNIDAZOLE (FLAGYL) IVPB 500 mg     500 mg 100 mL/hr over 60 Minutes Intravenous  Once 04/24/19 2046 04/24/19 2223     Subjective: No complaints, discussed d/c plan  Objective: Vitals:   05/06/19 2000 05/07/19 0405 05/07/19 0835 05/07/19 1155  BP:  108/68 (!) 113/59 114/76  Pulse:  81 87 75  Resp: 20 (!) 21  19  Temp:  97.7 F (36.5 C)  98.1 F (36.7 C)  TempSrc:  Oral  Oral  SpO2:  96%  96%  Weight:      Height:        Intake/Output Summary (Last 24 hours) at 05/07/2019 1616 Last data filed at 05/07/2019 1600 Gross per 24 hour  Intake 1633.08 ml  Output 1000 ml  Net 633.08 ml   Filed Weights   04/26/19 0406 04/27/19 0500 05/03/19 0402  Weight: 83.1 kg 84.2 kg 82 kg    Examination:  General: No acute distress. Cardiovascular: Heart sounds show Tabrina Esty regular rate, and rhythm. No gallops or rubs. No murmurs. No  JVD. Lungs: Clear to auscultation bilaterally with good air movement. No rales, rhonchi or wheezes. Abdomen: Soft, nontender, nondistended.  Incision with staples. Neurological: Alert and oriented 3. Moves all extremities 4 with equal strength. Cranial nerves II through XII grossly intact. Skin: Warm and dry. No rashes or lesions. Extremities: No clubbing or cyanosis. No edema.   Data Reviewed: I have personally reviewed following labs and imaging studies  CBC: Recent Labs  Lab 05/01/19 0536 05/02/19 0439 05/03/19 0443 05/04/19 0413 05/05/19 0500 05/06/19 0512 05/07/19 0407  WBC 13.5* 10.9* 13.5* 13.4* 11.8* 10.6* 10.7*  NEUTROABS 9.2* 9.3* 11.6* 11.5* 8.3*  --   --   HGB 10.4* 10.3* 11.0* 9.4* 9.2* 9.0* 8.8*  HCT 30.6* 30.0* 32.9* 28.7* 27.2* 27.8* 26.4*  MCV 93.9 94.0 94.0 94.7 94.1 95.5 94.3  PLT 401* 386 379 328 304 332 [id number]   Basic Metabolic Panel: Recent Labs  Lab 05/01/19 0536 05/02/19 0439 05/03/19 0443 05/04/19 0413 05/05/19 0500 05/06/19 0512 05/07/19 0407  NA 138 137 134* 133* 137 136 135  K 4.0 4.3 4.6 4.8 4.5 4.7 4.1  CL 108 105 101 101 104 104 104  CO2 25 24 24 25 26 25 24   GLUCOSE 109* 123* 150* 127* 190* 116* 134*  BUN 13 13 15 20 21 22 23   CREATININE 0.83 0.85 0.97 1.08 1.15 1.20 1.21  CALCIUM 7.4* 7.3* 7.3* 7.6* 7.6* 7.5* 7.5*  MG 1.9 2.0 1.8 1.9  --  1.8 1.8  PHOS 2.6 3.1 3.7 3.9  --   --   --    GFR: Estimated Creatinine Clearance: 47.8 mL/min (by C-G formula based on SCr of 1.21 mg/dL). Liver Function Tests: Recent Labs  Lab 05/03/19 0443 05/06/19 0512 05/07/19 0407  AST 29 24 24   ALT 16 17 16   ALKPHOS 87 109 129*  BILITOT 0.6 0.5 0.4  PROT 5.0* 5.0* 5.2*  ALBUMIN 1.7* 1.8* 1.8*   No results for input(s): LIPASE, AMYLASE in the last 168 hours. No results for input(s): AMMONIA in the last 168 hours. Coagulation Profile: No results for input(s): INR, PROTIME in the last 168 hours. Cardiac Enzymes: No results for input(s):  CKTOTAL, CKMB, CKMBINDEX, TROPONINI in the last 168 hours. BNP (last 3 results) Recent Labs    01/29/19 0852  PROBNP 165.0*   HbA1C: No results for input(s): HGBA1C in the last 72 hours. CBG: Recent Labs  Lab 05/06/19 1655 05/06/19 2111 05/07/19 0031 05/07/19 0613 05/07/19 1151  GLUCAP 179* 151* 178* 119* 127*   Lipid Profile: No results for input(s): CHOL, HDL, LDLCALC, TRIG, CHOLHDL, LDLDIRECT in the last 72 hours. Thyroid Function Tests: No results for input(s): TSH, T4TOTAL, FREET4, T3FREE, THYROIDAB in the last 72 hours. Anemia Panel: No results for input(s): VITAMINB12, FOLATE, FERRITIN, TIBC, IRON, RETICCTPCT in the last 72 hours. Sepsis Labs: No results for input(s): PROCALCITON, LATICACIDVEN in the last 168 hours.  Recent Results (from the past 240 hour(s))  Culture, blood (routine x 2)     Status: None   Collection Time: 04/30/19  4:32 PM   Specimen: BLOOD LEFT HAND  Result Value Ref Range Status   Specimen Description BLOOD LEFT HAND  Final   Special Requests   Final    BOTTLES DRAWN AEROBIC AND ANAEROBIC Blood Culture adequate volume   Culture   Final    NO GROWTH 5 DAYS Performed at [facility], [address] [place], [place] [address]    Report Status 05/05/2019 FINAL  Final  Culture, blood (routine x 2)     Status: None   Collection Time: 04/30/19  4:37 PM   Specimen: BLOOD  Result Value Ref Range Status   Specimen Description BLOOD LEFT THUMB  Final   Special Requests   Final    BOTTLES DRAWN AEROBIC AND ANAEROBIC Blood Culture adequate volume   Culture   Final    NO GROWTH 5 DAYS Performed at [facility], [place] [address], [place], [place] [address]    Report Status 05/05/2019 FINAL  Final         Radiology Studies: Dg Esophagus W Single Cm (sol Or Thin Ba)  Result Date: 05/06/2019 CLINICAL DATA:  Evaluate for esophageal stricture or mass. Unable to pass TEE. EXAM: ESOPHOGRAM/BARIUM SWALLOW TECHNIQUE: Single contrast  examination was performed using  thin barium. FLUOROSCOPY TIME:  Fluoroscopy Time:  1 minutes 54 seconds Number of Acquired Spot Images: 0 COMPARISON:  CT abdomen pelvis 04/24/2019; CT chest 11/29/2017 FINDINGS: Single-contrast barium flows freely throughout the esophagus without evidence for stricture or mass lesion. Mild esophageal dysmotility. The barium tablet passed appropriately into the stomach. No hiatal hernia identified. IMPRESSION: No evidence for esophageal stricture or mass lesion on single-contrast esophagram. Electronically Signed   By: Lovey Newcomer M.D.   On: 05/06/2019 09:11        Scheduled Meds:  acetaminophen  1,000 mg Oral Q6H   amLODipine  5 mg Oral Daily   apixaban  5 mg Oral BID   Chlorhexidine Gluconate Cloth  6 each Topical Daily   feeding supplement (ENSURE ENLIVE)  237 mL Oral QID   insulin aspart  0-15 Units Subcutaneous TID WC   latanoprost  1 drop Both Eyes QHS   metoprolol succinate  12.5 mg Oral Daily   pantoprazole  40 mg Oral Daily   Continuous Infusions:  cefTRIAXone (ROCEPHIN)  IV 2 g (05/07/19 0837)   lactated ringers 10 mL/hr at 04/27/19 1016   metronidazole 500 mg (05/07/19 1336)     LOS: 13 days    Time spent: over 30 min    Fayrene Helper, MD [name] Pager AMION  If 7PM-7AM, please contact night-coverage [email or web address] Password [email or web address] 05/07/2019, 4:16 PM

## 2019-05-07 NOTE — Discharge Summary (Addendum)
Patient ID: Calvin Burnett [id number] December 28, 1933 85 y.o.  Admit date: 04/24/2019 Discharge date: 05/11/2019  Admitting Diagnosis: intra-abdominal abscesses diabetes pneumoperitoneum history of prostate cancer dementia chronic kidney disease stage III paroxysmal atrial fibrillation hypertension GERD  Discharge Diagnosis Patient Active Problem List   Diagnosis Date Noted  . Intra-abdominal abscess ([place]) 04/24/2019  . Perforation of viscus   . Pneumoperitoneum   . Perforation bowel ([place]) 04/14/2019  . Hyperlipidemia 04/14/2019  . History of prostate cancer 04/14/2019  . History of colon cancer 04/14/2019  . Dementia ([place]) 04/14/2019  . Aortic atherosclerosis ([place]) 01/29/2019  . Pneumoperitoneum of unknown etiology 06/03/2018  . B12 deficiency 12/04/2017  . Stricture of sigmoid s/p robotic sigmoidectomy 11/26/2017 11/26/2017  . Chronic anticoagulation 11/26/2017  . pT1pN0 colon cancer s/p robotic right colectomy 11/26/2017 10/02/2017  . Right inguinal hernia 10/02/2017  . AF (paroxysmal atrial fibrillation) ([place]) 04/09/2017  . Night sweats 09/16/2016  . Major depressive disorder with single episode, in full remission ([place]) 07/29/2016  . History of adenomatous polyp of colon 10/27/2014  . Bowel habit changes 10/14/2014  . Rectal pain 10/14/2014  . Syncope 09/27/2014  . Bradycardia 09/27/2014  . CKD (chronic kidney disease), stage III ([place]) 06/01/2014  . Hyperlipidemia associated with type 2 diabetes mellitus ([place]) 03/01/2014  . Prostate cancer ([place]) 08/12/2012  . Type 2 diabetes mellitus ([place]) 07/31/2010  . LEG CRAMPS, NOCTURNAL 04/30/2010  . Basal cell carcinoma 05/03/2009  . Vascular dementia ([place]) 07/27/2008  . Cervical spondylosis without myelopathy 12/11/2007  . GERD (gastroesophageal reflux disease) 11/05/2007  . Essential hypertension 01/30/2007  s/p ex lap with SBR  Consultants General surgery IR  Reason for Admission: Calvin Burnett is a 85 y.o.  male with medical history significant of diabetes, GERD, hypertension and prostate cancer who was recently admitted with intra-abdominal abscess due to perforated viscus from April 13, 2009 discharged home on antibiotics, history of A. fib, currently on Eliquis.  Hypertension GERD and Alzheimer's dementia, history of right-sided colectomy and sigmoidoscopy in May 2019, pneumoperitoneum dry on last admission who presented again with abdominal pain as well as fever.  Patient had temperature up to 102.  He was on Eliquis and no surgery was done previous admission.  His abscess was deemed not drainable at the time.  He was reevaluated today and found to have worsening intra-abdominal abscess.  Patient has not been on antibiotics in the last few days.  He is being readmitted to the hospital due to worsening intra-abdominal abscess...  ED Course: Temperature 100.5 blood pressure 100/67 pulse 98 respiratory 24 oxygen sat 95% on room air.  Sodium 135 potassium 3.3 chloride 103 CO2 22 glucose 142.  Creatinine 1.27 calcium 7.2.  Albumin 2.0.  White count 17.9 platelet lites 406 and hemoglobin 12.8.  COVID-19 screen is negative.  CT abdomen pelvis shows interval development of multiple intra-abdominal abscesses measure approximately 6.2 x 5.2 x 7.1 cm.  Multiple thick-walled loops of small bowel in the mid abdomen small volume pneumoperitoneum which is decreased from previous small bilateral pleural effusions with bladder wall thickening.  Surgery is consulted and patient is being admitted to the hospital  Procedures CT guided placement of percutaneous abdominal drain 10/11 - IR Exploratory laparotomy with LOA, SBR - Calvin Burnett, 04/27/19  Hospital Course:  A fib The patient's eliquis was held on admission for procedures.  Once these procedures were completed it was resumed with no intermediate issues.    Alzheimer's dementia No acute issues during his stay.  HTN He remained on his normal home dose of  Norvasc and metoprolol during his stay.  His blood pressure remained well controlled.  CKD Stable during admission.  No acute renal issues  Strep Constellatus bacteremia The patient was noted to have bacteremia on his initial blood cultures.  He was treated with Rocephin/Flagyl for the above issue as well as his post surgical needs for intra-abdominal abscess.  He had follow up blood cultures that were negative.  He completed all abx therapy prior to discharge. Cardiology was consulted for a TEE, but the scope was unable to be passed for unknown reasons.  He then had a barium swallow which revealed no acute findings.  No further work up was completed.  He was afebrile and had a normal WBC.  POD14,S/p exploratory laparotomy/ LOA/ SB resection with anastomosis 10/13 - Calvin Burnett The patient was noted to have an IAA on admission along with persistent pneumoperitoneum.  He had an IR drain placed which revealed enteric contents.  Because of this, surgery was then pursued.  He underwent the above procedure.  He tolerated this well.  Postoperatively, his bowel function returned and his diet was able to be advanced as tolerated.  His TNA that was initiated post-operatively was able to be weaned and turned off.  He took nutritional supplements as well that helped.  On POD 10, his staples were removed.  He was otherwise stable at this time for DC home once [facility] was arranged for PT/OT.  Physical Exam: Gen: alert, NAD, pleasant Lungs: normal effort, CTAB GI: Abd: soft, minimally tender, +BS, midline incision is c/d/i with steri-strips  Allergies as of 05/08/2019      Reactions   Kcentra [prothrombin Complex Conc Human] Anaphylaxis   Patient immediately became short of breath and bright red. Started improving minutes after infusion stopped.      Medication List    TAKE these medications   acetaminophen 500 MG tablet Commonly known as: TYLENOL Take 500 mg by mouth daily as needed for moderate pain or  headache.   amLODipine 5 MG tablet Commonly known as: NORVASC Take 5 mg by mouth daily.   apixaban 5 MG Tabs tablet Commonly known as: Eliquis Take 1 tablet (5 mg total) by mouth 2 (two) times daily.   cefTRIAXone  IVPB Commonly known as: ROCEPHIN Inject 2 g into the vein daily for 4 days. Indication:  bacteremia Last Day of Therapy: 05/11/2019 Labs - Once weekly:  CBC/D and BMP, Labs - Every other week:  ESR and CRP   latanoprost 0.005 % ophthalmic solution Commonly known as: XALATAN Place 1 drop into both eyes at bedtime.   magnesium chloride 64 MG Tbec SR tablet Commonly known as: SLOW-MAG Take 1 tablet (64 mg total) by mouth daily for 2 days.   memantine 28 MG Cp24 24 hr capsule Commonly known as: NAMENDA XR TAKE 1 CAPSULE (28 MG TOTAL) BY MOUTH DAILY.   metFORMIN 500 MG 24 hr tablet Commonly known as: GLUCOPHAGE-XR TAKE 1 TABLET BY MOUTH EVERY DAY WITH BREAKFAST What changed: See the new instructions.   metoprolol succinate 25 MG 24 hr tablet Commonly known as: TOPROL-XL TAKE 1/2 TABLET BY MOUTH EVERY DAY   metroNIDAZOLE 500 MG tablet Commonly known as: Flagyl Take 1 tablet (500 mg total) by mouth 3 (three) times daily for 4 days.            Home Infusion Instuctions  (From admission, onward)         Start  Ordered   05/07/19 0000  Home infusion instructions [facility] May follow [place] Dosing Protocol; May administer Cathflo as needed to maintain patency of vascular access device.; Flushing of vascular access device: per [facility] Protocol: 0.9% NaCl pre/post medica...    Question Answer Comment  Instructions May follow [place] Dosing Protocol   Instructions May administer Cathflo as needed to maintain patency of vascular access device.   Instructions Flushing of vascular access device: per [facility] Protocol: 0.9% NaCl pre/post medication administration and prn patency; Heparin 100 u/ml, 36m for implanted ports and Heparin 10u/ml, 524mfor all  other central venous catheters.   Instructions May follow [facility] Anaphylaxis Protocol for First Dose Administration in the home: 0.9% NaCl at 25-50 ml/hr to maintain IV access for protocol meds. Epinephrine 0.3 ml IV/IM PRN and Benadryl 25-50 IV/IM PRN s/s of anaphylaxis.   Instructions [facility] Infusion Coordinator (RN) to assist per patient IV care needs in the home PRN.      05/07/19 15[place](From admission, onward)         Start     Ordered   05/05/19 0916  For home use only DME Walker rolling  Once    Question:  Patient needs a walker to treat with the following condition  Answer:  Physical deconditioning   05/05/19 0915   05/05/19 0916  For home use only DME 3 n 1  Once     05/05/19 0915           Follow-up Information    TsDonnie MesaMD Follow up on 05/24/2019.   Specialty: General Surgery Why: 9:10am, arrive by 8:40am for paperwork and check in.  please bring insurance card and photo ID Contact information: [address] [place] [phone]         Signed: KeHenreitta Cea0:12 AM 05/11/2019

## 2019-05-07 NOTE — Discharge Instructions (Signed)
[facility]      [facility] °[phone] ° °OPEN ABDOMINAL SURGERY: POST OP INSTRUCTIONS ° °Always review your discharge instruction sheet given to you by the facility where your surgery was performed. ° °IF YOU HAVE DISABILITY OR FAMILY LEAVE FORMS, YOU MUST BRING THEM TO THE OFFICE FOR PROCESSING.  PLEASE DO NOT GIVE THEM TO YOUR DOCTOR. ° °1. A prescription for pain medication may be given to you upon discharge.  Take your pain medication as prescribed, if needed.  If narcotic pain medicine is not needed, then you may take acetaminophen (Tylenol) or ibuprofen (Advil) as needed. °2. Take your usually prescribed medications unless otherwise directed. °3. If you need a refill on your pain medication, please contact your pharmacy. They will contact our office to request authorization.  Prescriptions will not be filled after 5pm or on week-ends. °4. You should follow a light diet the first few days after arrival home, such as soup and crackers, pudding, etc.unless your doctor has advised otherwise. A high-fiber, low fat diet can be resumed as tolerated.   Be sure to include lots of fluids daily. Most patients will experience some swelling and bruising on the chest and neck area.  Ice packs will help.  Swelling and bruising can take several days to resolve °5. Most patients will experience some swelling and bruising in the area of the incision. Ice pack will help. Swelling and bruising can take several days to resolve..  °6. It is common to experience some constipation if taking pain medication after surgery.  Increasing fluid intake and taking a stool softener will usually help or prevent this problem from occurring.  A mild laxative (Milk of Magnesia or Miralax) should be taken according to package directions if there are no bowel movements after 48 hours. °7.  You may have steri-strips (small skin tapes) in place directly over the incision.  These strips should be left on the skin for 7-10 days.  If your  surgeon used skin glue on the incision, you may shower in 24 hours.  The glue will flake off over the next 2-3 weeks.  Any sutures or staples will be removed at the office during your follow-up visit. You may find that a light gauze bandage over your incision may keep your staples from being rubbed or pulled. You may shower and replace the bandage daily. °8. ACTIVITIES:  You may resume regular (light) daily activities beginning the next day--such as daily self-care, walking, climbing stairs--gradually increasing activities as tolerated.  You may have sexual intercourse when it is comfortable.  Refrain from any heavy lifting or straining until approved by your doctor. °a. You may drive when you no longer are taking prescription pain medication, you can comfortably wear a seatbelt, and you can safely maneuver your car and apply brakes °b. Return to Work: ___________________________________ °9. You should see your doctor in the office for a follow-up appointment approximately two weeks after your surgery.  Make sure that you call for this appointment within a day or two after you arrive home to insure a convenient appointment time. °OTHER INSTRUCTIONS:  °_____________________________________________________________ °_____________________________________________________________ ° °WHEN TO CALL YOUR DOCTOR: °1. Fever over 101.0 °2. Inability to urinate °3. Nausea and/or vomiting °4. Extreme swelling or bruising °5. Continued bleeding from incision. °6. Increased pain, redness, or drainage from the incision. °7. Difficulty swallowing or breathing °8. Muscle cramping or spasms. °9. Numbness or tingling in hands or feet or around lips. ° °The clinic staff is available to   answer your questions during regular business hours.  Please dont hesitate to call and ask to speak to one of the nurses if you have concerns.  For further questions, please visit [email or web address]   PICC Home Care Guide  A peripherally  inserted central catheter (PICC) is a form of IV access that allows medicines and IV fluids to be quickly distributed throughout the body. The PICC is a long, thin, flexible tube (catheter) that is inserted into a vein in the upper arm. The catheter ends in a large vein in the chest (superior vena cava, or SVC). After the PICC is inserted, a chest X-ray may be done to make sure that it is in the correct place. A PICC may be placed for different reasons, such as: To give medicines and liquid nutrition. To give IV fluids and blood products. If there is trouble placing a peripheral intravenous (PIV) catheter. If taken care of properly, a PICC can remain in place for several months. Having a PICC can also allow a person to go home from the hospital sooner. Medicine and PICC care can be managed at home by a family member, caregiver, or home health care team. What are the risks? Generally, having a PICC is safe. However, problems may occur, including: A blood clot (thrombus) forming in or at the tip of the PICC. A blood clot forming in a vein (deep vein thrombosis) or traveling to the lung (pulmonary embolism). Inflammation of the vein (phlebitis) in which the PICC is placed. Infection. Central line associated blood stream infection (CLABSI) is a serious infection that often requires hospitalization. PICC movement (malposition). The PICC tip may move from its original position due to excessive physical activity, forceful coughing, sneezing, or vomiting. A break or cut in the PICC. It is important not to use scissors near the PICC. Nerve or tendon irritation or injury during PICC insertion. How to take care of your PICC Preventing problems You and any caregivers should wash your hands often with soap. Wash hands: Before touching the PICC line or the infusion device. Before changing a bandage (dressing). Flush the PICC as told by your health care provider. Let your health care provider know right away if  the PICC is hard to flush or does not flush. Do not use force to flush the PICC. Do not use a syringe that is less than 10 mL to flush the PICC. Avoid blood pressure checks on the arm in which the PICC is placed. Never pull or tug on the PICC. Do not take the PICC out yourself. Only a trained clinical professional should remove the PICC. Use clean and sterile supplies only. Keep the supplies in a dry place. Do not reuse needles, syringes, or any other supplies. Doing that can lead to infection. Keep pets and children away from your PICC line. Check the PICC insertion site every day for signs of infection. Check for: Leakage. Redness, swelling, or pain. Fluid or blood. Warmth. Pus or a bad smell. PICC dressing care Keep your PICC bandage (dressing) clean and dry to prevent infection. Do not take baths, swim, or use a hot tub until your health care provider approves. Ask your health care provider if you can take showers. You may only be allowed to take sponge baths for bathing. When you are allowed to shower: Ask your health care provider to teach you how to wrap the PICC line. Cover the PICC line with clear plastic wrap and tape to keep it dry while showering. Follow instructions  from your health care provider about how to take care of your insertion site and dressing. Make sure you: Wash your hands with soap and water before you change your bandage (dressing). If soap and water are not available, use hand sanitizer. Change your dressing as told by your health care provider. Leave stitches (sutures), skin glue, or adhesive strips in place. These skin closures may need to stay in place for 2 weeks or longer. If adhesive strip edges start to loosen and curl up, you may trim the loose edges. Do not remove adhesive strips completely unless your health care provider tells you to do that. Change your PICC dressing if it becomes loose or wet. General instructions  Carry your PICC identification card  or wear a medical alert bracelet at all times. Keep the tube clamped at all times, unless it is being used. Carry a smooth-edge clamp with you at all times to place on the tube if it breaks. Do not use scissors or sharp objects near the tube. You may bend your arm and move it freely. If your PICC is near or at the bend of your elbow, avoid activity with repeated motion at the elbow. Avoid lifting heavy objects as told by your health care provider. Keep all follow-up visits as told by your health care provider. This is important. Disposal of supplies Throw away any syringes in a disposal container that is meant for sharp items (sharps container). You can buy a sharps container from a pharmacy, or you can make one by using an empty hard plastic bottle with a cover. Place any used dressings or infusion bags into a plastic bag. Throw that bag in the trash. Contact a health care provider if: You have pain in your arm, ear, face, or teeth. You have a fever or chills. You have redness, swelling, or pain around the insertion site. You have fluid or blood coming from the insertion site. Your insertion site feels warm to the touch. You have pus or a bad smell coming from the insertion site. Your skin feels hard and raised around the insertion site. Get help right away if: Your PICC is accidentally pulled all the way out. If this happens, cover the insertion site with a bandage or gauze dressing. Do not throw the PICC away. Your health care provider will need to check it. Your PICC was tugged or pulled and has partially come out. Do not  push the PICC back in. You cannot flush the PICC, it is hard to flush, or the PICC leaks around the insertion site when it is flushed. You hear a "flushing" sound when the PICC is flushed. You feel your heart racing or skipping beats. There is a hole or tear in the PICC. You have swelling in the arm in which the PICC was inserted. You have a red streak going up your  arm from where the PICC was inserted. Summary A peripherally inserted central catheter (PICC) is a long, thin, flexible tube (catheter) that is inserted into a vein in the upper arm. The PICC is inserted using a sterile technique by a specially trained nurse or physician. Only a trained clinical professional should remove it. Keep your PICC identification card with you at all times. Avoid blood pressure checks on the arm in which the PICC is placed. If cared for properly, a PICC can remain in place for several months. Having a PICC can also allow a person to go home from the hospital sooner. This information is  not intended to replace advice given to you by your health care provider. Make sure you discuss any questions you have with your health care provider. Document Released: 01/05/2003 Document Revised: 06/13/2017 Document Reviewed: 08/03/2016 Elsevier Patient Education  2020 [employer].

## 2019-05-07 NOTE — Progress Notes (Signed)
PHARMACY CONSULT NOTE FOR:  OUTPATIENT  PARENTERAL ANTIBIOTIC THERAPY (OPAT)  Indication: Streptococcus constellatus bacteremia Regimen: Ceftriaxone 2g IV q 24 hrs End date: 05/11/2019  IV antibiotic discharge orders are pended. To discharging provider:  please sign these orders via discharge navigator,  Select New Orders & click on the button choice - Manage This Unsigned Work.     Thank you for allowing pharmacy to be a part of this patient's care.  Calvin Burnett 05/07/2019, 10:12 AM

## 2019-05-07 NOTE — TOC Progression Note (Addendum)
Transition of Care [facility]) - Progression Note    Patient Details  Name: KAMARI KOPACK MRN: [id number] Date of Birth: 01-Dec-1933  Transition of Care [facility]) CM/SW [place], [place] Phone Number: 05/07/2019, 1:20 PM  Clinical Narrative:     1:43pm- patient's daughter,Amy returned call- she confirmed patient's daughter is moving in with him. She confirmed HH choice is [facility]. CSW contacted [facility]-left voice message to contact CSW.  1:20pm-CSW called patient's daughter, Amy, left ,message to return call.  Thurmond Butts, MSW, [facility] Clinical Social Worker [phone]   Expected Discharge Plan: [place] Barriers to Discharge: Continued Medical Work up  Expected Discharge Plan and Services Expected Discharge Plan: [place] In-house Referral: Clinical Social Work Discharge Planning Services: CM Consult   Living arrangements for the past 2 months: [place]                 DME Arranged: 3-N-1, Walker rolling DME Agency: [facility]       HH Arranged: PT, OT HH Agency: [facility] ([facility] [employer]) Date [place]: 05/05/19 Time [place]: 1300 Representative spoke with at [place]: [place] ([place]) Interventions    Readmission Risk Interventions No flowsheet data found.

## 2019-05-08 LAB — COMPREHENSIVE METABOLIC PANEL
ALT: 20 U/L (ref 0–44)
AST: 33 U/L (ref 15–41)
Albumin: 1.9 g/dL — ABNORMAL LOW (ref 3.5–5.0)
Alkaline Phosphatase: 180 U/L — ABNORMAL HIGH (ref 38–126)
Anion gap: 11 (ref 5–15)
BUN: 20 mg/dL (ref 8–23)
CO2: 22 mmol/L (ref 22–32)
Calcium: 5.9 mg/dL — CL (ref 8.9–10.3)
Chloride: 102 mmol/L (ref 98–111)
Creatinine, Ser: 1.15 mg/dL (ref 0.61–1.24)
GFR calc Af Amer: >60 mL/min (ref >60)
GFR calc non Af Amer: 59 mL/min — ABNORMAL LOW (ref >60)
Glucose, Bld: 207 mg/dL — ABNORMAL HIGH (ref 70–99)
Potassium: 5.3 mmol/L — ABNORMAL HIGH (ref 3.5–5.1)
Sodium: 135 mmol/L (ref 135–145)
Total Bilirubin: 0.5 mg/dL (ref 0.3–1.2)
Total Protein: 5.7 g/dL — ABNORMAL LOW (ref 6.5–8.1)

## 2019-05-08 LAB — GLUCOSE, CAPILLARY
Glucose-Capillary: 148 mg/dL — ABNORMAL HIGH (ref 70–99)
Glucose-Capillary: 167 mg/dL — ABNORMAL HIGH (ref 70–99)
Glucose-Capillary: 127 mg/dL — ABNORMAL HIGH (ref 70–99)
Glucose-Capillary: 127 mg/dL — ABNORMAL HIGH (ref 70–99)

## 2019-05-08 LAB — CBC
HCT: 28.3 % — ABNORMAL LOW (ref 39.0–52.0)
Hemoglobin: 9.5 g/dL — ABNORMAL LOW (ref 13.0–17.0)
MCH: 31.3 pg (ref 26.0–34.0)
MCHC: 33.6 g/dL (ref 30.0–36.0)
MCV: 93.1 fL (ref 80.0–100.0)
Platelets: 455 10*3/uL — ABNORMAL HIGH (ref 150–400)
RBC: 3.04 MIL/uL — ABNORMAL LOW (ref 4.22–5.81)
RDW: 14.9 % (ref 11.5–15.5)
WBC: 11.3 10*3/uL — ABNORMAL HIGH (ref 4.0–10.5)
nRBC: 0 % (ref 0.0–0.2)

## 2019-05-08 LAB — MAGNESIUM
Magnesium: 1.3 mg/dL — ABNORMAL LOW (ref 1.7–2.4)
Magnesium: 1.9 mg/dL (ref 1.7–2.4)

## 2019-05-08 MED ORDER — CALCIUM GLUCONATE-NACL 2-0.675 GM/100ML-% IV SOLN
2.0000 g | Freq: Once | INTRAVENOUS | Status: AC
  Administered 2019-05-08: 2000 mg via INTRAVENOUS
  Filled 2019-05-08: qty 100

## 2019-05-08 MED ORDER — MAGNESIUM CHLORIDE 64 MG PO TBEC
1.0000 | DELAYED_RELEASE_TABLET | Freq: Every day | ORAL | Status: AC
  Administered 2019-05-08 – 2019-05-10 (×3): 64 mg via ORAL
  Filled 2019-05-08 (×3): qty 1

## 2019-05-08 MED ORDER — MAGNESIUM SULFATE 4 GM/100ML IV SOLN
4.0000 g | Freq: Once | INTRAVENOUS | Status: DC
  Filled 2019-05-08: qty 100

## 2019-05-08 MED ORDER — MAGNESIUM CHLORIDE 64 MG PO TBEC: 1.0000 | DELAYED_RELEASE_TABLET | Freq: Every day | ORAL | 0 refills | Status: AC

## 2019-05-08 NOTE — Progress Notes (Signed)
Patient ambulated 470 feet with nursing staff and walker. Patient tolerated well gait stead. Back in room call bell with in reach will monitor patient. Nimesh Riolo, Bettina Gavia RN

## 2019-05-08 NOTE — Progress Notes (Signed)
Spoke with RN case manager about IV antibiotics at discharge. This RN explained that patient would be discharged now with IV antibiotics at home. RN case manager stated she would check with the [facility] agency to ensure this was a possibility. Will monitor patient. Isabel Freese, Bettina Gavia RN

## 2019-05-08 NOTE — Progress Notes (Signed)
Occupational Therapy Treatment Patient Details Name: Calvin Burnett MRN: [id number] DOB: 03-Sep-1933 Today's Date: 05/08/2019    History of present illness Calvin Burnett is a 85 y.o. male with PMH: a-fib, alxheimer's dementia, diabetes, GERD, hypertension and prostate cancer who was recently admitted with intra-abdominal abscess due to perforated viscus from April 13, 2009 discharged home on antibiotics. PSH: history of right-sided colectomy and sigmoidoscopy in May 2019. Pt admitted for worsening abd pain. Pt underwent abdominal ex lap on10/13.  Abd xray 04/29/19: concerning for ileusor distal small bowel obstruction.   OT comments  Pt demonstrated ability to perform toileting, standing grooming and UB dressing with supervision/set up. Needing assist to set up meal tray. Ambulated with RW in room with supervision. Educated pt in multiple uses of 3 in1. Pt to return home with his daughter's 24 hour assist later today. HHOT continues to be appropriate.  Follow Up Recommendations  Home health OT;Supervision/Assistance - 24 hour    Equipment Recommendations  3 in 1 bedside commode    Recommendations for Other Services      Precautions / Restrictions Precautions Precautions: Fall Restrictions Weight Bearing Restrictions: No       Mobility Bed Mobility               General bed mobility comments: pt received OOB  Transfers Overall transfer level: Needs assistance Equipment used: Rolling walker (2 wheeled) Transfers: Sit to/from Stand Sit to Stand: Supervision         General transfer comment: good technique with RW    Balance Overall balance assessment: Needs assistance   Sitting balance-Leahy Scale: Good       Standing balance-Leahy Scale: Fair Standing balance comment: statically                           ADL either performed or assessed with clinical judgement   ADL Overall ADL's : Needs assistance/impaired Eating/Feeding: Minimal  assistance;Sitting Eating/Feeding Details (indicate cue type and reason): assist to cut food, pt attempting to eat french toast with fingers Grooming: Wash/dry hands;Wash/dry face;Supervision/safety;Standing           Upper Body Dressing : Set up;Sitting       Toilet Transfer: Supervision/safety;Ambulation;RW;BSC   Toileting- Water quality scientist and Hygiene: Supervision/safety;Sit to/from stand       Functional mobility during ADLs: Supervision/safety;Rolling walker       Vision       Perception     Praxis      Cognition Arousal/Alertness: Awake/alert Behavior During Therapy: WFL for tasks assessed/performed Overall Cognitive Status: History of cognitive impairments - at baseline                                          Exercises     Shoulder Instructions       General Comments      Pertinent Vitals/ Pain       Pain Assessment: No/denies pain  Home Living Family/patient expects to be discharged to:: Private residence Living Arrangements: Alone Available Help at Discharge: Family;Available PRN/intermittently                                    Prior Functioning/Environment              Frequency  Min 2X/week  Progress Toward Goals  OT Goals(current goals can now be found in the care plan section)  Progress towards OT goals: Progressing toward goals  Acute Rehab OT Goals Patient Stated Goal: to get better and go home OT Goal Formulation: With patient Time For Goal Achievement: 05/12/19 Potential to Achieve Goals: Good  Plan Discharge plan remains appropriate    Co-evaluation                 AM-PAC OT "6 Clicks" Daily Activity     Outcome Measure   Help from another person eating meals?: A Little Help from another person taking care of personal grooming?: A Little Help from another person toileting, which includes using toliet, bedpan, or urinal?: A Little Help from another person  bathing (including washing, rinsing, drying)?: A Little Help from another person to put on and taking off regular upper body clothing?: None Help from another person to put on and taking off regular lower body clothing?: A Little 6 Click Score: 19    End of Session Equipment Utilized During Treatment: Rolling walker;Gait belt  OT Visit Diagnosis: Unsteadiness on feet (R26.81);Muscle weakness (generalized) (M62.81);Pain   Activity Tolerance Patient tolerated treatment well   Patient Left in chair;with call bell/phone within reach;with chair alarm set   Nurse Communication          Time: [id number] OT Time Calculation (min): 15 min  Charges: OT General Charges $OT Visit: 1 Visit OT Treatments $Self Care/Home Management : 8-22 mins  Nestor Lewandowsky, OTR/L Acute Rehabilitation Services Pager: [phone] Office: [phone]   Malka So 05/08/2019, 8:29 AM

## 2019-05-08 NOTE — Progress Notes (Signed)
Patient BP 91/73 and 95/68 respectively this AM, up on recheck BP 99/67. Dr. Florene Glen Made aware and stated OK to hold Norvasc and Metoprolol this AM. Will monitor patient. Keeleigh Terris, Bettina Gavia RN

## 2019-05-08 NOTE — Progress Notes (Addendum)
Spoke with RN Case manager, she stated that patient would need to stay in hospital for course of IV antibiotics which will complete 10/27, due to inability of getting home health set up at this time. Doctor on call with [facility] made aware through answering service. Will await a call back. Miosha Behe, Bettina Gavia RN

## 2019-05-08 NOTE — Plan of Care (Signed)
  Problem: Education: Goal: Knowledge of General Education information will improve Description: Including pain rating scale, medication(s)/side effects and non-pharmacologic comfort measures Outcome: Progressing   Problem: Clinical Measurements: Goal: Ability to maintain clinical measurements within normal limits will improve Outcome: Progressing   

## 2019-05-08 NOTE — Plan of Care (Signed)
  Problem: Clinical Measurements: Goal: Will remain free from infection Outcome: Progressing   Problem: Nutrition: Goal: Adequate nutrition will be maintained Outcome: Progressing   

## 2019-05-08 NOTE — Progress Notes (Signed)
Critical: Calcium 5.9 - received from lab at 0520

## 2019-05-08 NOTE — TOC Transition Note (Addendum)
Transition of Care [facility]) - CM/SW Discharge Note   Patient Details  Name: Calvin Burnett MRN: [id number] Date of Birth: 03/23/1934  Transition [facility]) CM/SW Contact:  Claudie Leach, RN Phone Number: 05/08/2019, 3:15 PM   Clinical Narrative:    DC order placed today with orders for home IV antibiotics .  IV antibiotics/HHRN had not been arranged as it was thought patient would stay until IV antibiotics complete on 10/27.  Home health is limited for patient due to insurance.  PT/OT arranged with [facility] as a 3rd choice.  [facility] is [facility] provider of choice for patient's insurance and is not able to take patient for nursing due to staffing. D/W Pollie Meyer, RN for [facility].  5 other [place] agencies contacted today without success.   [facility]- no [facility]- No [facility] no [facility]- no [facility]- No [facility]- no [facility]- no  [facility] could provide medication today but patient would require RN visit tomorrow since no education had been done.  Because [facility] unavailable, patient may have to complete IV antibiotics here (will be complete 10/27).  Will continue to follow and advise of any changes.      Barriers to Discharge: Continued Medical Work up   Patient Goals and CMS Choice Patient states their goals for this hospitalization and ongoing recovery are:: to return home with family(per son Ronalee Belts) CMS Medicare.gov Compare Post Acute Care list provided to:: Patient Represenative (must comment) Choice offered to / list presented to : Patient, Adult Children   Discharge Plan and Services In-house Referral: Clinical Social Work Discharge Planning Services: CM Consult            DME Arranged: 3-N-1, Walker rolling DME Agency: [facility]       HH Arranged: PT, OT [place] Agency: [facility] Date [place]: 05/05/19 Time [place]: 1300 Representative spoke with at [place]: Jonelle Sidle

## 2019-05-08 NOTE — Progress Notes (Addendum)
PROGRESS NOTE    Calvin Burnett  [id number] DOB: November 11, 1933 DOA: 04/24/2019 PCP: Marin Olp, MD   Brief Narrative: Calvin Burnett 85 y.o.malewith medical history significant ofdiabetes, GERD, hypertension and prostate cancer who was recently admitted with intra-abdominal abscess due to perforated viscus from April 13, 2009 discharged home on antibiotics, history of [place]. fib, currently on Eliquis. Hypertension GERD and Alzheimer's dementia, history of right-sided colectomy and sigmoidoscopy in May 2019, pneumoperitoneum who presented again with abdominal pain as well as fever. Patient had temperature up to 102. He was on Eliquis and no surgery was done previous admission. His abscess was deemed not drainable at the time. He was found to have worsening intra-abdominal abscess. Patient has not been on antibiotics in the last few days.   Upon arrival to ED, his temperature was 100.5 blood pressure 100/67 pulse 98 respiratory 24 oxygen sat 95% on room air. Sodium 135 potassium 3.3 chloride 103 CO2 22 glucose 142. Creatinine 1.27 calcium 7.2. Albumin 2.0. White count 17.9 platelet lites 406 and hemoglobin 12.8. COVID-19 screen is negative. CT abdomen pelvis shows interval development of multiple intra-abdominal abscesses measure approximately 6.2 x 5.2 x 7.1 cm. Multiple thick-walled loops of small bowel in the mid abdomen small volume pneumoperitoneum which is decreased from previous small bilateral pleural effusions with bladder wall thickening.   He was admitted under [place].  Surgery was consulted.  He underwent intra-abdominal drain placement by IR on 04/25/2019.  He was continued on broad-spectrum IV antibiotics.  Underwent exploratory laparotomy on 04/27/2019.  Patient's initial blood pressure which were drawn on 04/24/2019 turned out to be positive with Streptococcus constellatus on 04/28/2019.  Patient's antibiotics were de-escalated to Rocephin 2 g IV and Flagyl were  continued.  Transthoracic echo obtained on 04/29/2019 did not show any signs of endocarditis.  Discussed with ID and per their recommendation, spoke to cardiology.  TEE planned for 10/20, but failed.  Assessment & Plan:   Principal Problem:   Intra-abdominal abscess ([place]) Active Problems:   Essential hypertension   GERD (gastroesophageal reflux disease)   Type 2 diabetes mellitus ([name])   Prostate cancer ([place])   Hyperlipidemia associated with type 2 diabetes mellitus ([name])   CKD (chronic kidney disease), stage III ([name])   AF (paroxysmal atrial fibrillation) ([place])   Perforation bowel ([name])   Dementia ([name])   Pneumoperitoneum  Intra-abdominal abscesses/gram-positive bacteremia:recurrent infections. Ct abd: Interval development of multiple intra-abdominal abscesses as detailed above measuring up to approximately 6.2 x 5.2 by 7.1 cm. status post intra-abdominal drainage placed by IR on 04/25/2019.  He then underwent exploratory laparotomy on 04/27/2019. - Blood cx 10/10 -> strep constellatus  - repeat blood cx from 10/16 NGTD - Surgical cx with mixed anaerobic flora, multiple organisms - TEE failed on 10/20, unable to pass TEE probe - will follow with barium swallow (no evidence for stricture or mass lesion) - Discussed with ID, Dr. Baxter Flattery on 10/23, TTE without evidence of vegetations, recommended 14 days abx therapy for strep constellatus bacteremia (on discussion today with Dr. Baxter Flattery, plan to complete therapy on 10/27 with ceftriaxone and oral flagyl which is 2 weeks from ex lap on 10/13).  PICC should be removed when therapy is complete. - opat orders placed today - Continue ceftriaxone and flagyl - TNA d/c'd, pt now on soft diet  Diabetes mellitus type II: Follow blood sugars, ok today.  Continue SSI.  He's on metformin at home.  Dementia:Stable.  He is alert and oriented.  Chronic  kidney disease stage [id number] at baseline. Continue monitoring  Paroxysmal atrial  fibrillation:Rate is controlled. In sinus rhythm.  Eliquis was resumed on 05/02/2019.  Continue that.  Hypertension: Blood pressure very well controlled.  Continue current regimen.  SBP in 90's on day of discharge.  Will hold BP meds today, he's asx.  Discussed with nursing, will add instructions to d/c information for pt to hold amlodipine until follow up with PCP and follow BP's at home.  Will plan to continue metoprolol at home.  Electrolyte Abnormalities: suspect this was lab error (may have been dilute with IVF).  Repeat WNL.   DVT prophylaxis: eliquis Code Status: full code Family Communication: none at bedside - daughter over phone on 10/23, plan for d/c on 10/24 Disposition Plan: pending further imrpovement, completion of abx therapy   Consultants:   Surgery  ID over phone  IR  Procedures:   Intra abdominal drain placement 10/11  Ex lap on 10/13  Antimicrobials:  Anti-infectives (From admission, onward)   Start     Dose/Rate Route Frequency Ordered Stop   05/07/19 0000  cefTRIAXone (ROCEPHIN) IVPB     2 g Intravenous Every 24 hours 05/07/19 1537 05/11/19 2359   05/07/19 0000  metroNIDAZOLE (FLAGYL) 500 MG tablet     500 mg Oral 3 times daily 05/07/19 1619 05/11/19 2359   05/04/19 0830  metroNIDAZOLE (FLAGYL) IVPB 500 mg     500 mg 100 mL/hr over 60 Minutes Intravenous Every 8 hours 05/04/19 0740     05/04/19 0800  cefTRIAXone (ROCEPHIN) 2 g in sodium chloride 0.9 % 100 mL IVPB     2 g 200 mL/hr over 30 Minutes Intravenous Every 24 hours 05/04/19 0740     04/29/19 1600  cefTRIAXone (ROCEPHIN) 2 g in sodium chloride 0.9 % 100 mL IVPB  Status:  Discontinued     2 g 200 mL/hr over 30 Minutes Intravenous Every 24 hours 04/29/19 1557 05/01/19 0900   04/29/19 1600  metroNIDAZOLE (FLAGYL) IVPB 500 mg  Status:  Discontinued     500 mg 100 mL/hr over 60 Minutes Intravenous Every 8 hours 04/29/19 1557 05/01/19 1341   04/27/19 1700  piperacillin-tazobactam (ZOSYN) IVPB  3.375 g  Status:  Discontinued     3.375 g 12.5 mL/hr over 240 Minutes Intravenous Every 8 hours 04/27/19 1640 04/29/19 1557   04/25/19 2200  vancomycin (VANCOCIN) 1,250 mg in sodium chloride 0.9 % 250 mL IVPB  Status:  Discontinued     1,250 mg 166.7 mL/hr over 90 Minutes Intravenous Every 24 hours 04/25/19 0158 04/27/19 1640   04/25/19 2100  ceFEPIme (MAXIPIME) 2 g in sodium chloride 0.9 % 100 mL IVPB  Status:  Discontinued     2 g 200 mL/hr over 30 Minutes Intravenous Every 24 hours 04/24/19 2213 04/25/19 0900   04/25/19 1400  ceFEPIme (MAXIPIME) 2 g in sodium chloride 0.9 % 100 mL IVPB  Status:  Discontinued     2 g 200 mL/hr over 30 Minutes Intravenous Every 12 hours 04/25/19 1341 04/27/19 1640   04/25/19 1000  cefTRIAXone (ROCEPHIN) 2 g in sodium chloride 0.9 % 100 mL IVPB  Status:  Discontinued     2 g 200 mL/hr over 30 Minutes Intravenous Every 24 hours 04/25/19 0841 04/25/19 1335   04/25/19 0015  metroNIDAZOLE (FLAGYL) IVPB 500 mg  Status:  Discontinued     500 mg 100 mL/hr over 60 Minutes Intravenous Every 8 hours 04/25/19 0006 04/27/19 1640   04/25/19 0000  vancomycin (  VANCOCIN) 1,500 mg in sodium chloride 0.9 % 500 mL IVPB     1,500 mg 250 mL/hr over 120 Minutes Intravenous  Once 04/24/19 2355 04/25/19 0248   04/24/19 2215  ceFEPIme (MAXIPIME) 2 g in sodium chloride 0.9 % 100 mL IVPB  Status:  Discontinued     2 g 200 mL/hr over 30 Minutes Intravenous Every 24 hours 04/24/19 2211 04/24/19 2213   04/24/19 2100  ceFEPIme (MAXIPIME) 2 g in sodium chloride 0.9 % 100 mL IVPB     2 g 200 mL/hr over 30 Minutes Intravenous  Once 04/24/19 2046 04/24/19 2154   04/24/19 2100  metroNIDAZOLE (FLAGYL) IVPB 500 mg     500 mg 100 mL/hr over 60 Minutes Intravenous  Once 04/24/19 2046 04/24/19 2223     Subjective: No complaints today  Objective: Vitals:   05/08/19 0333 05/08/19 0929 05/08/19 0930 05/08/19 1049  BP: 112/75 91/73 95/68  99/67  Pulse: 94 84 87   Resp: (!) 23 20 (!) 21  18  Temp: 99.3 F (37.4 C)     TempSrc: Oral     SpO2: 98% 99% 96%   Weight:      Height:        Intake/Output Summary (Last 24 hours) at 05/08/2019 1054 Last data filed at 05/08/2019 0334 Gross per 24 hour  Intake 568.67 ml  Output 700 ml  Net -131.33 ml   Filed Weights   04/26/19 0406 04/27/19 0500 05/03/19 0402  Weight: 83.1 kg 84.2 kg 82 kg    Examination:  General: No acute distress. Cardiovascular: Heart sounds show Calvin Burnett regular rate, and rhythm.  Lungs: Clear to auscultation bilaterally  Abdomen: Soft, nontender, nondistended.  Staples removed, incision well appearing. Neurological: Alert and oriented 3. Moves all extremities 4. Cranial nerves II through XII grossly intact. Skin: Warm and dry. No rashes or lesions. Extremities: No clubbing or cyanosis. No edema.   Data Reviewed: I have personally reviewed following labs and imaging studies  CBC: Recent Labs  Lab 05/02/19 0439 05/03/19 0443 05/04/19 0413 05/05/19 0500 05/06/19 0512 05/07/19 0407 05/08/19 0415  WBC 10.9* 13.5* 13.4* 11.8* 10.6* 10.7* 11.3*  NEUTROABS 9.3* 11.6* 11.5* 8.3*  --   --   --   HGB 10.3* 11.0* 9.4* 9.2* 9.0* 8.8* 9.5*  HCT 30.0* 32.9* 28.7* 27.2* 27.8* 26.4* 28.3*  MCV 94.0 94.0 94.7 94.1 95.5 94.3 93.1  PLT 386 379 328 304 332 358 [id number]*   Basic Metabolic Panel: Recent Labs  Lab 05/02/19 0439 05/03/19 0443 05/04/19 0413 05/05/19 0500 05/06/19 0512 05/07/19 0407 05/08/19 0415 05/08/19 0933  NA 137 134* 133* 137 136 135 135 136  K 4.3 4.6 4.8 4.5 4.7 4.1 5.3* 4.1  CL 105 101 101 104 104 104 102 103  CO2 24 24 25 26 25 24 22 23   GLUCOSE 123* 150* 127* 190* 116* 134* 207* 132*  BUN 13 15 20 21 22 23 20 20   CREATININE 0.85 0.97 1.08 1.15 1.20 1.21 1.15 0.98  CALCIUM 7.3* 7.3* 7.6* 7.6* 7.5* 7.5* 5.9* 8.7*  MG 2.0 1.8 1.9  --  1.8 1.8 1.3* 1.9  PHOS 3.1 3.7 3.9  --   --   --   --   --    GFR: Estimated Creatinine Clearance: 59 mL/min (by C-G formula based on SCr of 0.98  mg/dL). Liver Function Tests: Recent Labs  Lab 05/03/19 0443 05/06/19 0512 05/07/19 0407 05/08/19 0415  AST 29 24 24  33  ALT 16 17 16  20  ALKPHOS 87 109 129* 180*  BILITOT 0.6 0.5 0.4 0.5  PROT 5.0* 5.0* 5.2* 5.7*  ALBUMIN 1.7* 1.8* 1.8* 1.9*   No results for input(s): LIPASE, AMYLASE in the last 168 hours. No results for input(s): AMMONIA in the last 168 hours. Coagulation Profile: No results for input(s): INR, PROTIME in the last 168 hours. Cardiac Enzymes: No results for input(s): CKTOTAL, CKMB, CKMBINDEX, TROPONINI in the last 168 hours. BNP (last 3 results) Recent Labs    01/29/19 0852  PROBNP 165.0*   HbA1C: No results for input(s): HGBA1C in the last 72 hours. CBG: Recent Labs  Lab 05/07/19 0613 05/07/19 1151 05/07/19 1721 05/07/19 2126 05/08/19 0629  GLUCAP 119* 127* 162* 190* 148*   Lipid Profile: No results for input(s): CHOL, HDL, LDLCALC, TRIG, CHOLHDL, LDLDIRECT in the last 72 hours. Thyroid Function Tests: No results for input(s): TSH, T4TOTAL, FREET4, T3FREE, THYROIDAB in the last 72 hours. Anemia Panel: No results for input(s): VITAMINB12, FOLATE, FERRITIN, TIBC, IRON, RETICCTPCT in the last 72 hours. Sepsis Labs: No results for input(s): PROCALCITON, LATICACIDVEN in the last 168 hours.  Recent Results (from the past 240 hour(s))  Culture, blood (routine x 2)     Status: None   Collection Time: 04/30/19  4:32 PM   Specimen: BLOOD LEFT HAND  Result Value Ref Range Status   Specimen Description BLOOD LEFT HAND  Final   Special Requests   Final    BOTTLES DRAWN AEROBIC AND ANAEROBIC Blood Culture adequate volume   Culture   Final    NO GROWTH 5 DAYS Performed at [facility], [address] [place], [place] [address]    Report Status 05/05/2019 FINAL  Final  Culture, blood (routine x 2)     Status: None   Collection Time: 04/30/19  4:37 PM   Specimen: BLOOD  Result Value Ref Range Status   Specimen Description BLOOD LEFT THUMB   Final   Special Requests   Final    BOTTLES DRAWN AEROBIC AND ANAEROBIC Blood Culture adequate volume   Culture   Final    NO GROWTH 5 DAYS Performed at [facility], [place] [address] [place], [place] [address]    Report Status 05/05/2019 FINAL  Final         Radiology Studies: No results found.      Scheduled Meds:  acetaminophen  1,000 mg Oral Q6H   amLODipine  5 mg Oral Daily   apixaban  5 mg Oral BID   Chlorhexidine Gluconate Cloth  6 each Topical Daily   feeding supplement (ENSURE ENLIVE)  237 mL Oral QID   insulin aspart  0-15 Units Subcutaneous TID WC   latanoprost  1 drop Both Eyes QHS   magnesium chloride  1 tablet Oral Daily   metoprolol succinate  12.5 mg Oral Daily   pantoprazole  40 mg Oral Daily   Continuous Infusions:  cefTRIAXone (ROCEPHIN)  IV 2 g (05/08/19 0950)   lactated ringers 10 mL/hr at 04/27/19 1016   magnesium sulfate bolus IVPB     metronidazole 500 mg (05/08/19 0645)     LOS: 14 days    Time spent: over 64 min    Fayrene Helper, MD [name] Pager AMION  If 7PM-7AM, please contact night-coverage [email or web address] Password [email or web address] 05/08/2019, 10:54 AM

## 2019-05-09 LAB — GLUCOSE, CAPILLARY
Glucose-Capillary: 119 mg/dL — ABNORMAL HIGH (ref 70–99)
Glucose-Capillary: 178 mg/dL — ABNORMAL HIGH (ref 70–99)
Glucose-Capillary: 136 mg/dL — ABNORMAL HIGH (ref 70–99)
Glucose-Capillary: 140 mg/dL — ABNORMAL HIGH (ref 70–99)

## 2019-05-09 LAB — BASIC METABOLIC PANEL
Anion gap: 10 (ref 5–15)
BUN: 20 mg/dL (ref 8–23)
CO2: 23 mmol/L (ref 22–32)
Calcium: 8.7 mg/dL — ABNORMAL LOW (ref 8.9–10.3)
Chloride: 103 mmol/L (ref 98–111)
Creatinine, Ser: 0.98 mg/dL (ref 0.61–1.24)
GFR calc Af Amer: >60 mL/min (ref >60)
GFR calc non Af Amer: >60 mL/min (ref >60)
Glucose, Bld: 132 mg/dL — ABNORMAL HIGH (ref 70–99)
Potassium: 4.1 mmol/L (ref 3.5–5.1)
Sodium: 136 mmol/L (ref 135–145)

## 2019-05-09 NOTE — Plan of Care (Signed)
  Problem: Education: Goal: Knowledge of General Education information will improve Description: Including pain rating scale, medication(s)/side effects and non-pharmacologic comfort measures Outcome: Progressing   Problem: Clinical Measurements: Goal: Ability to maintain clinical measurements within normal limits will improve Outcome: Progressing   

## 2019-05-09 NOTE — Progress Notes (Signed)
Patient ambulated in hallway with nursing staff and walker. 450 feet. Patient tolerated well patient to restroom after walk will monitor patient. Nkenge Sonntag, Bettina Gavia RN

## 2019-05-09 NOTE — Progress Notes (Signed)
[facility] Progress Note  5 Days Post-Op  Subjective: CC: no complaints Patient denies abdominal pain. Tolerating diet and having bowel function. HH unable to be arranged for yesterday so patient will remain admitted while finishing abx.   Objective: Vital signs in last 24 hours: Temp:  [97.5 F (36.4 C)-98.7 F (37.1 C)] 98.2 F (36.8 C) (10/25 0526) Pulse Rate:  [84-102] 102 (10/25 0526) Resp:  [16-22] 16 (10/25 0526) BP: (91-134)/(55-81) 134/73 (10/25 0526) SpO2:  [96 %-100 %] 99 % (10/25 0526) Weight:  [75.2 kg] 75.2 kg (10/25 0526) Last BM Date: 05/07/19  Intake/Output from previous day: 10/24 0701 - 10/25 0700 In: 353.2 [P.O.:120; IV Piggyback:233.2] Out: 800 [Urine:800] Intake/Output this shift: No intake/output data recorded.  PE: Gen: alert, NAD, pleasant Cardiac: RRR Lungs: normal effort, CTAB GI: Abd: soft, minimally tender, +BS, midline incision is c/d/i with steri-strips  Lab Results:  Recent Labs    05/07/19 0407 05/08/19 0415  WBC 10.7* 11.3*  HGB 8.8* 9.5*  HCT 26.4* 28.3*  PLT 358 455*   BMET Recent Labs    05/08/19 0415 05/08/19 0933  NA 135 136  K 5.3* 4.1  CL 102 103  CO2 22 23  GLUCOSE 207* 132*  BUN 20 20  CREATININE 1.15 0.98  CALCIUM 5.9* 8.7*   PT/INR No results for input(s): LABPROT, INR in the last 72 hours. CMP     Component Value Date/Time   NA 136 05/08/2019 0933   K 4.1 05/08/2019 0933   CL 103 05/08/2019 0933   CO2 23 05/08/2019 0933   GLUCOSE 132 (H) 05/08/2019 0933   BUN 20 05/08/2019 0933   CREATININE 0.98 05/08/2019 0933   CALCIUM 8.7 (L) 05/08/2019 0933   PROT 5.7 (L) 05/08/2019 0415   ALBUMIN 1.9 (L) 05/08/2019 0415   AST 33 05/08/2019 0415   ALT 20 05/08/2019 0415   ALKPHOS 180 (H) 05/08/2019 0415   BILITOT 0.5 05/08/2019 0415   GFRNONAA >60 05/08/2019 0933   GFRAA >60 05/08/2019 0933   Lipase     Component Value Date/Time   LIPASE 42 04/24/2019 1848       Studies/Results: No  results found.  Anti-infectives: Anti-infectives (From admission, onward)   Start     Dose/Rate Route Frequency Ordered Stop   05/07/19 0000  cefTRIAXone (ROCEPHIN) IVPB     2 g Intravenous Every 24 hours 05/07/19 1537 05/11/19 2359   05/07/19 0000  metroNIDAZOLE (FLAGYL) 500 MG tablet     500 mg Oral 3 times daily 05/07/19 1619 05/11/19 2359   05/04/19 0830  metroNIDAZOLE (FLAGYL) IVPB 500 mg     500 mg 100 mL/hr over 60 Minutes Intravenous Every 8 hours 05/04/19 0740     05/04/19 0800  cefTRIAXone (ROCEPHIN) 2 g in sodium chloride 0.9 % 100 mL IVPB     2 g 200 mL/hr over 30 Minutes Intravenous Every 24 hours 05/04/19 0740     04/29/19 1600  cefTRIAXone (ROCEPHIN) 2 g in sodium chloride 0.9 % 100 mL IVPB  Status:  Discontinued     2 g 200 mL/hr over 30 Minutes Intravenous Every 24 hours 04/29/19 1557 05/01/19 0900   04/29/19 1600  metroNIDAZOLE (FLAGYL) IVPB 500 mg  Status:  Discontinued     500 mg 100 mL/hr over 60 Minutes Intravenous Every 8 hours 04/29/19 1557 05/01/19 1341   04/27/19 1700  piperacillin-tazobactam (ZOSYN) IVPB 3.375 g  Status:  Discontinued     3.375 g 12.5 mL/hr over 240  Minutes Intravenous Every 8 hours 04/27/19 1640 04/29/19 1557   04/25/19 2200  vancomycin (VANCOCIN) 1,250 mg in sodium chloride 0.9 % 250 mL IVPB  Status:  Discontinued     1,250 mg 166.7 mL/hr over 90 Minutes Intravenous Every 24 hours 04/25/19 0158 04/27/19 1640   04/25/19 2100  ceFEPIme (MAXIPIME) 2 g in sodium chloride 0.9 % 100 mL IVPB  Status:  Discontinued     2 g 200 mL/hr over 30 Minutes Intravenous Every 24 hours 04/24/19 2213 04/25/19 0900   04/25/19 1400  ceFEPIme (MAXIPIME) 2 g in sodium chloride 0.9 % 100 mL IVPB  Status:  Discontinued     2 g 200 mL/hr over 30 Minutes Intravenous Every 12 hours 04/25/19 1341 04/27/19 1640   04/25/19 1000  cefTRIAXone (ROCEPHIN) 2 g in sodium chloride 0.9 % 100 mL IVPB  Status:  Discontinued     2 g 200 mL/hr over 30 Minutes Intravenous Every 24  hours 04/25/19 0841 04/25/19 1335   04/25/19 0015  metroNIDAZOLE (FLAGYL) IVPB 500 mg  Status:  Discontinued     500 mg 100 mL/hr over 60 Minutes Intravenous Every 8 hours 04/25/19 0006 04/27/19 1640   04/25/19 0000  vancomycin (VANCOCIN) 1,500 mg in sodium chloride 0.9 % 500 mL IVPB     1,500 mg 250 mL/hr over 120 Minutes Intravenous  Once 04/24/19 2355 04/25/19 0248   04/24/19 2215  ceFEPIme (MAXIPIME) 2 g in sodium chloride 0.9 % 100 mL IVPB  Status:  Discontinued     2 g 200 mL/hr over 30 Minutes Intravenous Every 24 hours 04/24/19 2211 04/24/19 2213   04/24/19 2100  ceFEPIme (MAXIPIME) 2 g in sodium chloride 0.9 % 100 mL IVPB     2 g 200 mL/hr over 30 Minutes Intravenous  Once 04/24/19 2046 04/24/19 2154   04/24/19 2100  metroNIDAZOLE (FLAGYL) IVPB 500 mg     500 mg 100 mL/hr over 60 Minutes Intravenous  Once 04/24/19 2046 04/24/19 2223       Assessment/Plan A fib, onEliquis Alzheimer's dementia HTN HxT1N0colon cancer of hepatic flexure, sigmoid stricture S/P robotic LOA, right colectomy, sigmoidectomy 11/26/17 CKD Strep Constellatus bacteremia-unable to do TEE, will complete abx therapy on Sunday it appears  POD12,S/p exploratory laparotomy/ LOA/ SB resection with anastomosis 10/13 - [place] -soft diet andensurefor additional supplementation.  -DC staples today and apply steri-strips -mobilize more. TID ambulation. -[facility] arranged, will be discharging with daughter, [name], home.  Plan Monday after completion of abx therapy on Sunday  FEN -soft diet,ensure VTE -Eliquis ID -Rocephin/Flagyl,end on 10/27 Dispo - HH with daughter this week  LOS: 15 days    Brigid Re , [facility] Surgery 05/09/2019, 9:04 AM Please see Amion for pager number during day hours 7:00am-4:30pm

## 2019-05-09 NOTE — Progress Notes (Addendum)
PROGRESS NOTE    Calvin Burnett  [id number] DOB: [birth year] DOA: 04/24/2019 PCP: Marin Olp, MD   Brief Narrative: Calvin Burnett 85 y.o.malewith medical history significant ofdiabetes, GERD, hypertension and prostate cancer who was recently admitted with intra-abdominal abscess due to perforated viscus from April 13, 2009 discharged home on antibiotics, history of Calvin Burnett. fib, currently on Eliquis. Hypertension GERD and Alzheimer's dementia, history of right-sided colectomy and sigmoidoscopy in May 2019, pneumoperitoneum who presented again with abdominal pain as well as fever. Patient had temperature up to 102. He was on Eliquis and no surgery was done previous admission. His abscess was deemed not drainable at the time. He was found to have worsening intra-abdominal abscess. Patient has not been on antibiotics in the last few days.   Upon arrival to ED, his temperature was 100.5 blood pressure 100/67 pulse 98 respiratory 24 oxygen sat 95% on room air. Sodium 135 potassium 3.3 chloride 103 CO2 22 glucose 142. Creatinine 1.27 calcium 7.2. Albumin 2.0. White count 17.9 platelet lites 406 and hemoglobin 12.8. COVID-19 screen is negative. CT abdomen pelvis shows interval development of multiple intra-abdominal abscesses measure approximately 6.2 x 5.2 x 7.1 cm. Multiple thick-walled loops of small bowel in the mid abdomen small volume pneumoperitoneum which is decreased from previous small bilateral pleural effusions with bladder wall thickening.   He was admitted under Calvin Burnett.  Surgery was consulted.  He underwent intra-abdominal drain placement by IR on 04/25/2019.  He was continued on broad-spectrum IV antibiotics.  Underwent exploratory laparotomy on 04/27/2019.  Patient's initial blood pressure which were drawn on 04/24/2019 turned out to be positive with Streptococcus constellatus on 04/28/2019.  Patient's antibiotics were de-escalated to Rocephin 2 g IV and Flagyl were  continued.  Transthoracic echo obtained on 04/29/2019 did not show any signs of endocarditis.  Discussed with ID and per their recommendation, spoke to cardiology.  TEE planned for 10/20, but failed.  Assessment & Plan:   Principal Problem:   Intra-abdominal abscess ([place]) Active Problems:   Essential hypertension   GERD (gastroesophageal reflux disease)   Type 2 diabetes mellitus ([name])   Prostate cancer ([place])   Hyperlipidemia associated with type 2 diabetes mellitus ([place])   CKD (chronic kidney disease), stage III ([name])   AF (paroxysmal atrial fibrillation) ([place])   Perforation bowel ([place])   Dementia ([place])   Pneumoperitoneum  Plan for d/c on 10/24, but home health unable to be set up unfortunately - complete abx in house.   Intra-abdominal abscesses/gram-positive bacteremia:recurrent infections. Ct abd: Interval development of multiple intra-abdominal abscesses as detailed above measuring up to approximately 6.2 x 5.2 by 7.1 cm. status post intra-abdominal drainage placed by IR on 04/25/2019.  He then underwent exploratory laparotomy on 04/27/2019. - Blood cx 10/10 -> strep constellatus  - repeat blood cx from 10/16 NGTD - Surgical cx with mixed anaerobic flora, multiple organisms - TEE failed on 10/20, unable to pass TEE probe - will follow with barium swallow (no evidence for stricture or mass lesion) - Discussed with ID, Dr. Baxter Burnett on 10/23, TTE without evidence of vegetations, recommended 14 days abx therapy for strep constellatus bacteremia (on discussion with Dr. Baxter Burnett, plan to complete therapy on 10/27 with ceftriaxone and oral flagyl which is 2 weeks from ex lap on 10/13).  PICC should be removed when therapy is complete. - opat orders placed today - Continue ceftriaxone and flagyl - TNA d/c'd, pt now on soft diet  Diabetes mellitus type II: Follow blood sugars, ok  today.  Continue SSI.  He's on metformin at home.  Dementia:Stable.  He is alert and oriented.  Chronic  kidney disease stage [id number] at baseline. Continue monitoring  Paroxysmal atrial fibrillation:Rate is controlled. In sinus rhythm.  Eliquis was resumed on 05/02/2019.  Continue that.  Hypertension: Blood pressure very well controlled.  Continue current regimen.  SBP in 90's on day of discharge.  BP fluctuating, but pt asx.  Continue to monitor.  Amlodipine continued at this time in house (d/c'd from d/c meds yesterday when we thought he was to d/c yesterday, but will continue to monitor.  Will likely be able to tolerate this and can resume on d/c, but will follow).  Electrolyte Abnormalities: suspect this was lab error (may have been dilute with IVF).  Repeat WNL.   DVT prophylaxis: eliquis Code Status: full code Family Communication: none at bedside - daughter over phone on 10/23, plan for d/c on 10/24 Disposition Plan: pending further imrpovement, completion of abx therapy   Consultants:   Surgery  ID over phone  IR  Procedures:   Intra abdominal drain placement 10/11  Ex lap on 10/13  Antimicrobials:  Anti-infectives (From admission, onward)   Start     Dose/Rate Route Frequency Ordered Stop   05/07/19 0000  cefTRIAXone (ROCEPHIN) IVPB     2 g Intravenous Every 24 hours 05/07/19 1537 05/11/19 2359   05/07/19 0000  metroNIDAZOLE (FLAGYL) 500 MG tablet     500 mg Oral 3 times daily 05/07/19 1619 05/11/19 2359   05/04/19 0830  metroNIDAZOLE (FLAGYL) IVPB 500 mg     500 mg 100 mL/hr over 60 Minutes Intravenous Every 8 hours 05/04/19 0740     05/04/19 0800  cefTRIAXone (ROCEPHIN) 2 g in sodium chloride 0.9 % 100 mL IVPB     2 g 200 mL/hr over 30 Minutes Intravenous Every 24 hours 05/04/19 0740     04/29/19 1600  cefTRIAXone (ROCEPHIN) 2 g in sodium chloride 0.9 % 100 mL IVPB  Status:  Discontinued     2 g 200 mL/hr over 30 Minutes Intravenous Every 24 hours 04/29/19 1557 05/01/19 0900   04/29/19 1600  metroNIDAZOLE (FLAGYL) IVPB 500 mg  Status:  Discontinued      500 mg 100 mL/hr over 60 Minutes Intravenous Every 8 hours 04/29/19 1557 05/01/19 1341   04/27/19 1700  piperacillin-tazobactam (ZOSYN) IVPB 3.375 g  Status:  Discontinued     3.375 g 12.5 mL/hr over 240 Minutes Intravenous Every 8 hours 04/27/19 1640 04/29/19 1557   04/25/19 2200  vancomycin (VANCOCIN) 1,250 mg in sodium chloride 0.9 % 250 mL IVPB  Status:  Discontinued     1,250 mg 166.7 mL/hr over 90 Minutes Intravenous Every 24 hours 04/25/19 0158 04/27/19 1640   04/25/19 2100  ceFEPIme (MAXIPIME) 2 g in sodium chloride 0.9 % 100 mL IVPB  Status:  Discontinued     2 g 200 mL/hr over 30 Minutes Intravenous Every 24 hours 04/24/19 2213 04/25/19 0900   04/25/19 1400  ceFEPIme (MAXIPIME) 2 g in sodium chloride 0.9 % 100 mL IVPB  Status:  Discontinued     2 g 200 mL/hr over 30 Minutes Intravenous Every 12 hours 04/25/19 1341 04/27/19 1640   04/25/19 1000  cefTRIAXone (ROCEPHIN) 2 g in sodium chloride 0.9 % 100 mL IVPB  Status:  Discontinued     2 g 200 mL/hr over 30 Minutes Intravenous Every 24 hours 04/25/19 0841 04/25/19 1335   04/25/19 0015  metroNIDAZOLE (FLAGYL) IVPB  500 mg  Status:  Discontinued     500 mg 100 mL/hr over 60 Minutes Intravenous Every 8 hours 04/25/19 0006 04/27/19 1640   04/25/19 0000  vancomycin (VANCOCIN) 1,500 mg in sodium chloride 0.9 % 500 mL IVPB     1,500 mg 250 mL/hr over 120 Minutes Intravenous  Once 04/24/19 2355 04/25/19 0248   04/24/19 2215  ceFEPIme (MAXIPIME) 2 g in sodium chloride 0.9 % 100 mL IVPB  Status:  Discontinued     2 g 200 mL/hr over 30 Minutes Intravenous Every 24 hours 04/24/19 2211 04/24/19 2213   04/24/19 2100  ceFEPIme (MAXIPIME) 2 g in sodium chloride 0.9 % 100 mL IVPB     2 g 200 mL/hr over 30 Minutes Intravenous  Once 04/24/19 2046 04/24/19 2154   04/24/19 2100  metroNIDAZOLE (FLAGYL) IVPB 500 mg     500 mg 100 mL/hr over 60 Minutes Intravenous  Once 04/24/19 2046 04/24/19 2223     Subjective: No complaints.    Objective: Vitals:   05/09/19 0017 05/09/19 0526 05/09/19 0942 05/09/19 1148  BP: 131/81 134/73 120/79 90/69  Pulse: 88 (!) 102 (!) 101 84  Resp: 18 16 17 20   Temp: (!) 97.5 F (36.4 C) 98.2 F (36.8 C)  98 F (36.7 C)  TempSrc: Oral Oral  Oral  SpO2: 100% 99%  98%  Weight:  75.2 kg    Height:        Intake/Output Summary (Last 24 hours) at 05/09/2019 1556 Last data filed at 05/09/2019 0949 Gross per 24 hour  Intake 466.71 ml  Output 900 ml  Net -433.29 ml   Filed Weights   04/27/19 0500 05/03/19 0402 05/09/19 0526  Weight: 84.2 kg 82 kg 75.2 kg    Examination:  General: No acute distress. Cardiovascular: Heart sounds show Sharene Krikorian regular rate, and rhythm Lungs: Clear to auscultation bilaterally Abdomen: Soft, nontender, nondistended  Neurological: Alert and oriented 3. Moves all extremities 4. Cranial nerves II through XII grossly intact. Skin: Warm and dry. No rashes or lesions. Extremities: No clubbing or cyanosis. No edema.  Data Reviewed: I have personally reviewed following labs and imaging studies  CBC: Recent Labs  Lab 05/03/19 0443 05/04/19 0413 05/05/19 0500 05/06/19 0512 05/07/19 0407 05/08/19 0415  WBC 13.5* 13.4* 11.8* 10.6* 10.7* 11.3*  NEUTROABS 11.6* 11.5* 8.3*  --   --   --   HGB 11.0* 9.4* 9.2* 9.0* 8.8* 9.5*  HCT 32.9* 28.7* 27.2* 27.8* 26.4* 28.3*  MCV 94.0 94.7 94.1 95.5 94.3 93.1  PLT 379 328 304 332 358 [id number]*   Basic Metabolic Panel: Recent Labs  Lab 05/03/19 0443 05/04/19 0413 05/05/19 0500 05/06/19 0512 05/07/19 0407 05/08/19 0415 05/08/19 0933  NA 134* 133* 137 136 135 135 136  K 4.6 4.8 4.5 4.7 4.1 5.3* 4.1  CL 101 101 104 104 104 102 103  CO2 24 25 26 25 24 22 23   GLUCOSE 150* 127* 190* 116* 134* 207* 132*  BUN 15 20 21 22 23 20 20   CREATININE 0.97 1.08 1.15 1.20 1.21 1.15 0.98  CALCIUM 7.3* 7.6* 7.6* 7.5* 7.5* 5.9* 8.7*  MG 1.8 1.9  --  1.8 1.8 1.3* 1.9  PHOS 3.7 3.9  --   --   --   --   --    GFR: Estimated  Creatinine Clearance: 59 mL/min (by C-G formula based on SCr of 0.98 mg/dL). Liver Function Tests: Recent Labs  Lab 05/03/19 [id number] 05/06/19 [id number] 05/07/19 0407 05/08/19 0415  AST 29 24 24  33  ALT 16 17 16 20   ALKPHOS 87 109 129* 180*  BILITOT 0.6 0.5 0.4 0.5  PROT 5.0* 5.0* 5.2* 5.7*  ALBUMIN 1.7* 1.8* 1.8* 1.9*   No results for input(s): LIPASE, AMYLASE in the last 168 hours. No results for input(s): AMMONIA in the last 168 hours. Coagulation Profile: No results for input(s): INR, PROTIME in the last 168 hours. Cardiac Enzymes: No results for input(s): CKTOTAL, CKMB, CKMBINDEX, TROPONINI in the last 168 hours. BNP (last 3 results) Recent Labs    01/29/19 0852  PROBNP 165.0*   HbA1C: No results for input(s): HGBA1C in the last 72 hours. CBG: Recent Labs  Lab 05/08/19 1205 05/08/19 1711 05/08/19 2144 05/09/19 0608 05/09/19 1146  GLUCAP 167* 127* 127* 119* 178*   Lipid Profile: No results for input(s): CHOL, HDL, LDLCALC, TRIG, CHOLHDL, LDLDIRECT in the last 72 hours. Thyroid Function Tests: No results for input(s): TSH, T4TOTAL, FREET4, T3FREE, THYROIDAB in the last 72 hours. Anemia Panel: No results for input(s): VITAMINB12, FOLATE, FERRITIN, TIBC, IRON, RETICCTPCT in the last 72 hours. Sepsis Labs: No results for input(s): PROCALCITON, LATICACIDVEN in the last 168 hours.  Recent Results (from the past 240 hour(s))  Culture, blood (routine x 2)     Status: None   Collection Time: 04/30/19  4:32 PM   Specimen: BLOOD LEFT HAND  Result Value Ref Range Status   Specimen Description BLOOD LEFT HAND  Final   Special Requests   Final    BOTTLES DRAWN AEROBIC AND ANAEROBIC Blood Culture adequate volume   Culture   Final    NO GROWTH 5 DAYS Performed at [facility], [address], [place], [place] [address]    Report Status 05/05/2019 FINAL  Final  Culture, blood (routine x 2)     Status: None   Collection Time: 04/30/19  4:37 PM   Specimen: BLOOD  Result  Value Ref Range Status   Specimen Description BLOOD LEFT THUMB  Final   Special Requests   Final    BOTTLES DRAWN AEROBIC AND ANAEROBIC Blood Culture adequate volume   Culture   Final    NO GROWTH 5 DAYS Performed at [facility], [place] [address], [place], [place] [address]    Report Status 05/05/2019 FINAL  Final         Radiology Studies: No results found.      Scheduled Meds:  acetaminophen  1,000 mg Oral Q6H   amLODipine  5 mg Oral Daily   apixaban  5 mg Oral BID   Chlorhexidine Gluconate Cloth  6 each Topical Daily   feeding supplement (ENSURE ENLIVE)  237 mL Oral QID   insulin aspart  0-15 Units Subcutaneous TID WC   latanoprost  1 drop Both Eyes QHS   magnesium chloride  1 tablet Oral Daily   metoprolol succinate  12.5 mg Oral Daily   pantoprazole  40 mg Oral Daily   Continuous Infusions:  cefTRIAXone (ROCEPHIN)  IV 2 g (05/09/19 0949)   lactated ringers 10 mL/hr at 04/27/19 1016   magnesium sulfate bolus IVPB     metronidazole 500 mg (05/09/19 1355)     LOS: 15 days    Time spent: over 30 min    Fayrene Helper, MD [name] Pager AMION  If 7PM-7AM, please contact night-coverage [email or web address] Password [facility] 05/09/2019, 3:56 PM

## 2019-05-09 NOTE — Progress Notes (Signed)
Patient ambulated in hallway with nursing staff. Patient ambulated 450 feet. With rolling walker. Back in bed call bell within reach. Will monitor patient. Dulce Martian, Bettina Gavia RN

## 2019-05-10 LAB — BASIC METABOLIC PANEL
Anion gap: 8 (ref 5–15)
BUN: 19 mg/dL (ref 8–23)
CO2: 24 mmol/L (ref 22–32)
Calcium: 7.9 mg/dL — ABNORMAL LOW (ref 8.9–10.3)
Chloride: 104 mmol/L (ref 98–111)
Creatinine, Ser: 1.02 mg/dL (ref 0.61–1.24)
GFR calc Af Amer: >60 mL/min (ref >60)
GFR calc non Af Amer: >60 mL/min (ref >60)
Glucose, Bld: 114 mg/dL — ABNORMAL HIGH (ref 70–99)
Potassium: 4 mmol/L (ref 3.5–5.1)
Sodium: 136 mmol/L (ref 135–145)

## 2019-05-10 LAB — GLUCOSE, CAPILLARY
Glucose-Capillary: 102 mg/dL — ABNORMAL HIGH (ref 70–99)
Glucose-Capillary: 190 mg/dL — ABNORMAL HIGH (ref 70–99)
Glucose-Capillary: 159 mg/dL — ABNORMAL HIGH (ref 70–99)
Glucose-Capillary: 130 mg/dL — ABNORMAL HIGH (ref 70–99)

## 2019-05-10 LAB — CBC
HCT: 27.5 % — ABNORMAL LOW (ref 39.0–52.0)
Hemoglobin: 9.5 g/dL — ABNORMAL LOW (ref 13.0–17.0)
MCH: 31.9 pg (ref 26.0–34.0)
MCHC: 34.5 g/dL (ref 30.0–36.0)
MCV: 92.3 fL (ref 80.0–100.0)
Platelets: 454 10*3/uL — ABNORMAL HIGH (ref 150–400)
RBC: 2.98 MIL/uL — ABNORMAL LOW (ref 4.22–5.81)
RDW: 14.8 % (ref 11.5–15.5)
WBC: 8.8 10*3/uL (ref 4.0–10.5)
nRBC: 0 % (ref 0.0–0.2)

## 2019-05-10 NOTE — Progress Notes (Signed)
Patient ID: Calvin Burnett, male   DOB: August 03, 1933, 85 y.o.   MRN: [id number]    6 Days Post-Op  Subjective: No new issues.  Eating diet and moving bowels.  No pain  Objective: Vital signs in last 24 hours: Temp:  [97.4 F (36.3 C)-99.5 F (37.5 C)] 97.4 F (36.3 C) (10/26 0404) Pulse Rate:  [76-119] 116 (10/26 0825) Resp:  [17-21] 20 (10/26 0825) BP: (90-133)/(69-87) 118/75 (10/26 0825) SpO2:  [91 %-100 %] 91 % (10/26 0825) Last BM Date: 05/10/19  Intake/Output from previous day: 10/25 0701 - 10/26 0700 In: 666.7 [P.O.:200; IV Piggyback:466.7] Out: 1100 [Urine:1100] Intake/Output this shift: Total I/O In: -  Out: 150 [Urine:150]  PE: Abd: soft, NT, Nd, incision is healing well with steri-strips in place.  Lab Results:  Recent Labs    05/08/19 0415 05/10/19 0530  WBC 11.3* 8.8  HGB 9.5* 9.5*  HCT 28.3* 27.5*  PLT 455* 454*   BMET Recent Labs    05/08/19 0933 05/10/19 0530  NA 136 136  K 4.1 4.0  CL 103 104  CO2 23 24  GLUCOSE 132* 114*  BUN 20 19  CREATININE 0.98 1.02  CALCIUM 8.7* 7.9*   PT/INR No results for input(s): LABPROT, INR in the last 72 hours. CMP     Component Value Date/Time   NA 136 05/10/2019 0530   K 4.0 05/10/2019 0530   CL 104 05/10/2019 0530   CO2 24 05/10/2019 0530   GLUCOSE 114 (H) 05/10/2019 0530   BUN 19 05/10/2019 0530   CREATININE 1.02 05/10/2019 0530   CALCIUM 7.9 (L) 05/10/2019 0530   PROT 5.7 (L) 05/08/2019 0415   ALBUMIN 1.9 (L) 05/08/2019 0415   AST 33 05/08/2019 0415   ALT 20 05/08/2019 0415   ALKPHOS 180 (H) 05/08/2019 0415   BILITOT 0.5 05/08/2019 0415   GFRNONAA >60 05/10/2019 0530   GFRAA >60 05/10/2019 0530   Lipase     Component Value Date/Time   LIPASE 42 04/24/2019 1848       Studies/Results: No results found.  Anti-infectives: Anti-infectives (From admission, onward)   Start     Dose/Rate Route Frequency Ordered Stop   05/07/19 0000  cefTRIAXone (ROCEPHIN) IVPB     2 g Intravenous  Every 24 hours 05/07/19 1537 05/11/19 2359   05/07/19 0000  metroNIDAZOLE (FLAGYL) 500 MG tablet     500 mg Oral 3 times daily 05/07/19 1619 05/11/19 2359   05/04/19 0830  metroNIDAZOLE (FLAGYL) IVPB 500 mg     500 mg 100 mL/hr over 60 Minutes Intravenous Every 8 hours 05/04/19 0740     05/04/19 0800  cefTRIAXone (ROCEPHIN) 2 g in sodium chloride 0.9 % 100 mL IVPB     2 g 200 mL/hr over 30 Minutes Intravenous Every 24 hours 05/04/19 0740     04/29/19 1600  cefTRIAXone (ROCEPHIN) 2 g in sodium chloride 0.9 % 100 mL IVPB  Status:  Discontinued     2 g 200 mL/hr over 30 Minutes Intravenous Every 24 hours 04/29/19 1557 05/01/19 0900   04/29/19 1600  metroNIDAZOLE (FLAGYL) IVPB 500 mg  Status:  Discontinued     500 mg 100 mL/hr over 60 Minutes Intravenous Every 8 hours 04/29/19 1557 05/01/19 1341   04/27/19 1700  piperacillin-tazobactam (ZOSYN) IVPB 3.375 g  Status:  Discontinued     3.375 g 12.5 mL/hr over 240 Minutes Intravenous Every 8 hours 04/27/19 1640 04/29/19 1557   04/25/19 2200  vancomycin (VANCOCIN) 1,250 mg  in sodium chloride 0.9 % 250 mL IVPB  Status:  Discontinued     1,250 mg 166.7 mL/hr over 90 Minutes Intravenous Every 24 hours 04/25/19 0158 04/27/19 1640   04/25/19 2100  ceFEPIme (MAXIPIME) 2 g in sodium chloride 0.9 % 100 mL IVPB  Status:  Discontinued     2 g 200 mL/hr over 30 Minutes Intravenous Every 24 hours 04/24/19 2213 04/25/19 0900   04/25/19 1400  ceFEPIme (MAXIPIME) 2 g in sodium chloride 0.9 % 100 mL IVPB  Status:  Discontinued     2 g 200 mL/hr over 30 Minutes Intravenous Every 12 hours 04/25/19 1341 04/27/19 1640   04/25/19 1000  cefTRIAXone (ROCEPHIN) 2 g in sodium chloride 0.9 % 100 mL IVPB  Status:  Discontinued     2 g 200 mL/hr over 30 Minutes Intravenous Every 24 hours 04/25/19 0841 04/25/19 1335   04/25/19 0015  metroNIDAZOLE (FLAGYL) IVPB 500 mg  Status:  Discontinued     500 mg 100 mL/hr over 60 Minutes Intravenous Every 8 hours 04/25/19 0006  04/27/19 1640   04/25/19 0000  vancomycin (VANCOCIN) 1,500 mg in sodium chloride 0.9 % 500 mL IVPB     1,500 mg 250 mL/hr over 120 Minutes Intravenous  Once 04/24/19 2355 04/25/19 0248   04/24/19 2215  ceFEPIme (MAXIPIME) 2 g in sodium chloride 0.9 % 100 mL IVPB  Status:  Discontinued     2 g 200 mL/hr over 30 Minutes Intravenous Every 24 hours 04/24/19 2211 04/24/19 2213   04/24/19 2100  ceFEPIme (MAXIPIME) 2 g in sodium chloride 0.9 % 100 mL IVPB     2 g 200 mL/hr over 30 Minutes Intravenous  Once 04/24/19 2046 04/24/19 2154   04/24/19 2100  metroNIDAZOLE (FLAGYL) IVPB 500 mg     500 mg 100 mL/hr over 60 Minutes Intravenous  Once 04/24/19 2046 04/24/19 2223       Assessment/Plan A fib, onEliquis Alzheimer's dementia HTN HxT1N0colon cancer of hepatic flexure, sigmoid stricture S/P robotic LOA, right colectomy, sigmoidectomy 11/26/17 CKD Strep Constellatus bacteremia-unable to do TEE,will complete abx therapy on Sunday it appears  POD13,S/p exploratory laparotomy/ LOA/ SB resection with anastomosis 10/13 - [place] -soft diet andensurefor additional supplementation.  -finish abx tomorrow and plan for home at that time -surgically stable  FEN -soft diet,ensure VTE -Eliquis ID -Rocephin/Flagyl,end on 10/27 Dispo -HH with daughter this week   LOS: 16 days    [name] , [facility] Surgery 05/10/2019, 9:02 AM Please see Amion for pager number during day hours 7:00am-4:30pm

## 2019-05-10 NOTE — Progress Notes (Signed)
Occupational Therapy Treatment Patient Details Name: Calvin Burnett MRN: [id number] DOB: 06-15-1934 Today's Date: 05/10/2019    History of present illness Calvin Burnett is a 85 y.o. male with PMH: a-fib, alxheimer's dementia, diabetes, GERD, hypertension and prostate cancer who was recently admitted with intra-abdominal abscess due to perforated viscus from April 13, 2009 discharged home on antibiotics. PSH: history of right-sided colectomy and sigmoidoscopy in May 2019. Pt admitted for worsening abd pain. Pt underwent abdominal ex lap on10/13.  Abd xray 04/29/19: concerning for ileusor distal small bowel obstruction.   OT comments  Patient progressing well.  Reviewed shower transfers with min guard, using [place] for bathing seated for safety simulated supervision.  Supervision for toileting, toilet transfers and in room mobility. Reviewed energy conservation techniques as pt fatigues easily.  Will have support of family at dc.  Eager to [employer] home. Will follow acutely.   Follow Up Recommendations  Home health OT;Supervision/Assistance - 24 hour    Equipment Recommendations  3 in 1 bedside commode    Recommendations for Other Services      Precautions / Restrictions Precautions Precautions: Fall Precaution Comments: soft BP Restrictions Weight Bearing Restrictions: No       Mobility Bed Mobility               General bed mobility comments: OOB in recliner upon entry  Transfers Overall transfer level: Needs assistance Equipment used: Rolling walker (2 wheeled) Transfers: Sit to/from Stand Sit to Stand: Supervision         General transfer comment: supervision for safety    Balance Overall balance assessment: Needs assistance Sitting-balance support: Feet supported;No upper extremity supported Sitting balance-Leahy Scale: Good     Standing balance support: Bilateral upper extremity supported;During functional activity Standing balance-Leahy Scale: Fair Standing  balance comment: supervision for safety                           ADL either performed or assessed with clinical judgement   ADL Overall ADL's : Needs assistance/impaired     Grooming: Supervision/safety;Standing;Wash/dry hands       Lower Body Bathing: Supervison/ safety;Sit to/from stand Lower Body Bathing Details (indicate cue type and reason): reviewed safety and bathing seated, able to figure 4 technique to simulate LB bathing seated         Toilet Transfer: Supervision/safety;Ambulation;RW Toilet Transfer Details (indicate cue type and reason): supervision using grabbars to commode  Toileting- Clothing Manipulation and Hygiene: Supervision/safety;Sit to/from stand Toileting - Clothing Manipulation Details (indicate cue type and reason): supervision for safety  Tub/ Shower Transfer: Min guard;Cueing for safety;Cueing for sequencing;Ambulation;3 in 1;Rolling walker Tub/Shower Transfer Details (indicate cue type and reason): educated on safety with bathing seated on 3:1 in walk in shower, patient simulated reverse step transfer into shower with min guard for safety  Functional mobility during ADLs: Supervision/safety;Rolling walker General ADL Comments: educated on energy conservation techniques as pt fatigues easily; daughter present during session      Vision       Perception     Praxis      Cognition Arousal/Alertness: Awake/alert Behavior During Therapy: WFL for tasks assessed/performed Overall Cognitive Status: History of cognitive impairments - at baseline                                 General Comments: Per chart pt has baseline alzheimers dementia.  Exercises     Shoulder Instructions       General Comments VSS, HR upto 113 with activity     Pertinent Vitals/ Pain       Pain Assessment: No/denies pain  Home Living                                          Prior Functioning/Environment               Frequency  Min 2X/week        Progress Toward Goals  OT Goals(current goals can now be found in the care plan section)  Progress towards OT goals: Progressing toward goals  Acute Rehab OT Goals Patient Stated Goal: to get better and go home OT Goal Formulation: With patient  Plan Discharge plan remains appropriate;Frequency remains appropriate    Co-evaluation                 AM-PAC OT "6 Clicks" Daily Activity     Outcome Measure   Help from another person eating meals?: A Little Help from another person taking care of personal grooming?: A Little Help from another person toileting, which includes using toliet, bedpan, or urinal?: A Little Help from another person bathing (including washing, rinsing, drying)?: A Little Help from another person to put on and taking off regular upper body clothing?: None Help from another person to put on and taking off regular lower body clothing?: A Little 6 Click Score: 19    End of Session Equipment Utilized During Treatment: Rolling walker  OT Visit Diagnosis: Unsteadiness on feet (R26.81);Muscle weakness (generalized) (M62.81);Pain   Activity Tolerance Patient tolerated treatment well   Patient Left in chair;with call bell/phone within reach;with family/visitor present   Nurse Communication Mobility status        Time: [id number] OT Time Calculation (min): 19 min  Charges: OT General Charges $OT Visit: 1 Visit OT Treatments $Self Care/Home Management : 8-22 mins  Delight Stare, [place] Pager [phone] Office [phone]     Delight Stare 05/10/2019, 4:06 PM

## 2019-05-10 NOTE — Progress Notes (Signed)
PROGRESS NOTE    Calvin Burnett  [id number] DOB: [birth year] DOA: 04/24/2019 PCP: Marin Olp, MD   Brief Narrative: Calvin Ditter Turneris Calvin Burnett 85 y.o.malewith medical history significant ofdiabetes, GERD, hypertension and prostate cancer who was recently admitted with intra-abdominal abscess due to perforated viscus from April 13, 2009 discharged home on antibiotics, history of Calvin Burnett. fib, currently on Eliquis. Hypertension GERD and Alzheimer's dementia, history of right-sided colectomy and sigmoidoscopy in May 2019, pneumoperitoneum who presented again with abdominal pain as well as fever. Patient had temperature up to 102. He was on Eliquis and no surgery was done previous admission. His abscess was deemed not drainable at the time. He was found to have worsening intra-abdominal abscess. Patient has not been on antibiotics in the last few days.   Upon arrival to ED, his temperature was 100.5 blood pressure 100/67 pulse 98 respiratory 24 oxygen sat 95% on room air. Sodium 135 potassium 3.3 chloride 103 CO2 22 glucose 142. Creatinine 1.27 calcium 7.2. Albumin 2.0. White count 17.9 platelet lites 406 and hemoglobin 12.8. COVID-19 screen is negative. CT abdomen pelvis shows interval development of multiple intra-abdominal abscesses measure approximately 6.2 x 5.2 x 7.1 cm. Multiple thick-walled loops of small bowel in the mid abdomen small volume pneumoperitoneum which is decreased from previous small bilateral pleural effusions with bladder wall thickening.   He was admitted under [facility].  Surgery was consulted.  He underwent intra-abdominal drain placement by IR on 04/25/2019.  He was continued on broad-spectrum IV antibiotics.  Underwent exploratory laparotomy on 04/27/2019.  Patient's initial blood pressure which were drawn on 04/24/2019 turned out to be positive with Streptococcus constellatus on 04/28/2019.  Patient's antibiotics were de-escalated to Rocephin 2 g IV and Flagyl were  continued.  Transthoracic echo obtained on 04/29/2019 did not show any signs of endocarditis.  Discussed with ID and per their recommendation, spoke to cardiology.  TEE planned for 10/20, but failed.  Assessment & Plan:   Principal Problem:   Intra-abdominal abscess ([place]) Active Problems:   Essential hypertension   GERD (gastroesophageal reflux disease)   Type 2 diabetes mellitus ([name])   Prostate cancer ([place])   Hyperlipidemia associated with type 2 diabetes mellitus ([place])   CKD (chronic kidney disease), stage III ([name])   AF (paroxysmal atrial fibrillation) ([place])   Perforation bowel ([place])   Dementia ([place])   Pneumoperitoneum  Plan for d/c on 10/24, but home health unable to be set up unfortunately - complete abx in house.   Intra-abdominal abscesses/gram-positive bacteremia:recurrent infections. Ct abd: Interval development of multiple intra-abdominal abscesses as detailed above measuring up to approximately 6.2 x 5.2 by 7.1 cm. status post intra-abdominal drainage placed by IR on 04/25/2019.  He then underwent exploratory laparotomy on 04/27/2019. - Blood cx 10/10 -> strep constellatus  - repeat blood cx from 10/16 NGTD - Surgical cx with mixed anaerobic flora, multiple organisms - TEE failed on 10/20, unable to pass TEE probe - will follow with barium swallow (no evidence for stricture or mass lesion) - Discussed with ID, Dr. Baxter Flattery on 10/23, TTE without evidence of vegetations, recommended 14 days abx therapy for strep constellatus bacteremia (on discussion with Dr. Baxter Flattery, plan to complete therapy on 10/27 with ceftriaxone and oral flagyl which is 2 weeks from ex lap on 10/13).  PICC should be removed when therapy is complete. - opat orders placed today - Continue ceftriaxone and flagyl - TNA d/c'd, pt now on soft diet  Diabetes mellitus type II: Follow blood sugars, stable.  Continue SSI.  He's on metformin at home.  Dementia:Stable.  He is alert and oriented.  Chronic  kidney disease stage [id number] at baseline. Continue monitoring  Paroxysmal atrial fibrillation:Rate is controlled (90's and low 100's today). In sinus rhythm.  Eliquis was resumed on 05/02/2019.  Continue metoprolol.  Hypertension: Blood pressure very well controlled.  Continue current regimen.  SBP in 90's on day of discharge.  BP fluctuating, but pt asx.  Continue to monitor.  Amlodipine was held on 10/24, but it's been continued since then (d/c'd from d/c meds yesterday when we thought he was to d/c at that time.  Will likely be able to tolerate this and can resume on d/c, but will follow).  Electrolyte Abnormalities: suspect this was lab error (may have been dilute with IVF).  Repeat WNL.   DVT prophylaxis: eliquis Code Status: full code Family Communication: none at bedside - daughter over phone on 10/23  Disposition Plan: pending further imrpovement, completion of abx therapy - 10/27   Consultants:   Surgery  ID over phone  IR  Procedures:   Intra abdominal drain placement 10/11  Ex lap on 10/13  Antimicrobials:  Anti-infectives (From admission, onward)   Start     Dose/Rate Route Frequency Ordered Stop   05/07/19 0000  cefTRIAXone (ROCEPHIN) IVPB     2 g Intravenous Every 24 hours 05/07/19 1537 05/11/19 2359   05/07/19 0000  metroNIDAZOLE (FLAGYL) 500 MG tablet     500 mg Oral 3 times daily 05/07/19 1619 05/11/19 2359   05/04/19 0830  metroNIDAZOLE (FLAGYL) IVPB 500 mg     500 mg 100 mL/hr over 60 Minutes Intravenous Every 8 hours 05/04/19 0740     05/04/19 0800  cefTRIAXone (ROCEPHIN) 2 g in sodium chloride 0.9 % 100 mL IVPB     2 g 200 mL/hr over 30 Minutes Intravenous Every 24 hours 05/04/19 0740     04/29/19 1600  cefTRIAXone (ROCEPHIN) 2 g in sodium chloride 0.9 % 100 mL IVPB  Status:  Discontinued     2 g 200 mL/hr over 30 Minutes Intravenous Every 24 hours 04/29/19 1557 05/01/19 0900   04/29/19 1600  metroNIDAZOLE (FLAGYL) IVPB 500 mg  Status:   Discontinued     500 mg 100 mL/hr over 60 Minutes Intravenous Every 8 hours 04/29/19 1557 05/01/19 1341   04/27/19 1700  piperacillin-tazobactam (ZOSYN) IVPB 3.375 g  Status:  Discontinued     3.375 g 12.5 mL/hr over 240 Minutes Intravenous Every 8 hours 04/27/19 1640 04/29/19 1557   04/25/19 2200  vancomycin (VANCOCIN) 1,250 mg in sodium chloride 0.9 % 250 mL IVPB  Status:  Discontinued     1,250 mg 166.7 mL/hr over 90 Minutes Intravenous Every 24 hours 04/25/19 0158 04/27/19 1640   04/25/19 2100  ceFEPIme (MAXIPIME) 2 g in sodium chloride 0.9 % 100 mL IVPB  Status:  Discontinued     2 g 200 mL/hr over 30 Minutes Intravenous Every 24 hours 04/24/19 2213 04/25/19 0900   04/25/19 1400  ceFEPIme (MAXIPIME) 2 g in sodium chloride 0.9 % 100 mL IVPB  Status:  Discontinued     2 g 200 mL/hr over 30 Minutes Intravenous Every 12 hours 04/25/19 1341 04/27/19 1640   04/25/19 1000  cefTRIAXone (ROCEPHIN) 2 g in sodium chloride 0.9 % 100 mL IVPB  Status:  Discontinued     2 g 200 mL/hr over 30 Minutes Intravenous Every 24 hours 04/25/19 0841 04/25/19 1335   04/25/19 0015  metroNIDAZOLE (  FLAGYL) IVPB 500 mg  Status:  Discontinued     500 mg 100 mL/hr over 60 Minutes Intravenous Every 8 hours 04/25/19 0006 04/27/19 1640   04/25/19 0000  vancomycin (VANCOCIN) 1,500 mg in sodium chloride 0.9 % 500 mL IVPB     1,500 mg 250 mL/hr over 120 Minutes Intravenous  Once 04/24/19 2355 04/25/19 0248   04/24/19 2215  ceFEPIme (MAXIPIME) 2 g in sodium chloride 0.9 % 100 mL IVPB  Status:  Discontinued     2 g 200 mL/hr over 30 Minutes Intravenous Every 24 hours 04/24/19 2211 04/24/19 2213   04/24/19 2100  ceFEPIme (MAXIPIME) 2 g in sodium chloride 0.9 % 100 mL IVPB     2 g 200 mL/hr over 30 Minutes Intravenous  Once 04/24/19 2046 04/24/19 2154   04/24/19 2100  metroNIDAZOLE (FLAGYL) IVPB 500 mg     500 mg 100 mL/hr over 60 Minutes Intravenous  Once 04/24/19 2046 04/24/19 2223     Subjective: Feels weaker than  he thought he would No other complaints  Objective: Vitals:   05/09/19 1937 05/10/19 0404 05/10/19 0819 05/10/19 0825  BP: 116/73 116/73  118/75  Pulse: 88 76 (!) 119 (!) 116  Resp: 20 (!) 21  20  Temp: 99.5 F (37.5 C) (!) 97.4 F (36.3 C)    TempSrc: Oral Oral    SpO2: 100% 96%  91%  Weight:      Height:        Intake/Output Summary (Last 24 hours) at 05/10/2019 0916 Last data filed at 05/10/2019 0818 Gross per 24 hour  Intake 666.71 ml  Output 1250 ml  Net -583.29 ml   Filed Weights   04/27/19 0500 05/03/19 0402 05/09/19 0526  Weight: 84.2 kg 82 kg 75.2 kg    Examination:  General: No acute distress.  Sitting up eating breakfast. Cardiovascular: Heart sounds show Anamaria Dusenbury irregularly irregular rhythm Lungs: Clear to auscultation bilaterally Abdomen: Soft, nontender, nondistended  Neurological: Alert and oriented 3. Moves all extremities 4. Cranial nerves II through XII grossly intact. Skin: Warm and dry. No rashes or lesions. Extremities: No clubbing or cyanosis. No edema.   Data Reviewed: I have personally reviewed following labs and imaging studies  CBC: Recent Labs  Lab 05/04/19 0413 05/05/19 0500 05/06/19 0512 05/07/19 0407 05/08/19 0415 05/10/19 0530  WBC 13.4* 11.8* 10.6* 10.7* 11.3* 8.8  NEUTROABS 11.5* 8.3*  --   --   --   --   HGB 9.4* 9.2* 9.0* 8.8* 9.5* 9.5*  HCT 28.7* 27.2* 27.8* 26.4* 28.3* 27.5*  MCV 94.7 94.1 95.5 94.3 93.1 92.3  PLT 328 304 332 358 455* [id number]*   Basic Metabolic Panel: Recent Labs  Lab 05/04/19 0413  05/06/19 0512 05/07/19 0407 05/08/19 0415 05/08/19 0933 05/10/19 0530  NA 133*   < > 136 135 135 136 136  K 4.8   < > 4.7 4.1 5.3* 4.1 4.0  CL 101   < > 104 104 102 103 104  CO2 25   < > 25 24 22 23 24   GLUCOSE 127*   < > 116* 134* 207* 132* 114*  BUN 20   < > 22 23 20 20 19   CREATININE 1.08   < > 1.20 1.21 1.15 0.98 1.02  CALCIUM 7.6*   < > 7.5* 7.5* 5.9* 8.7* 7.9*  MG 1.9  --  1.8 1.8 1.3* 1.9  --   PHOS 3.9  --    --   --   --   --   --    < > =  values in this interval not displayed.   GFR: Estimated Creatinine Clearance: 56.7 mL/min (by C-G formula based on SCr of 1.02 mg/dL). Liver Function Tests: Recent Labs  Lab 05/06/19 0512 05/07/19 0407 05/08/19 0415  AST 24 24 33  ALT 17 16 20   ALKPHOS 109 129* 180*  BILITOT 0.5 0.4 0.5  PROT 5.0* 5.2* 5.7*  ALBUMIN 1.8* 1.8* 1.9*   No results for input(s): LIPASE, AMYLASE in the last 168 hours. No results for input(s): AMMONIA in the last 168 hours. Coagulation Profile: No results for input(s): INR, PROTIME in the last 168 hours. Cardiac Enzymes: No results for input(s): CKTOTAL, CKMB, CKMBINDEX, TROPONINI in the last 168 hours. BNP (last 3 results) Recent Labs    01/29/19 0852  PROBNP 165.0*   HbA1C: No results for input(s): HGBA1C in the last 72 hours. CBG: Recent Labs  Lab 05/09/19 0608 05/09/19 1146 05/09/19 1657 05/09/19 2131 05/10/19 0629  GLUCAP 119* 178* 136* 140* 102*   Lipid Profile: No results for input(s): CHOL, HDL, LDLCALC, TRIG, CHOLHDL, LDLDIRECT in the last 72 hours. Thyroid Function Tests: No results for input(s): TSH, T4TOTAL, FREET4, T3FREE, THYROIDAB in the last 72 hours. Anemia Panel: No results for input(s): VITAMINB12, FOLATE, FERRITIN, TIBC, IRON, RETICCTPCT in the last 72 hours. Sepsis Labs: No results for input(s): PROCALCITON, LATICACIDVEN in the last 168 hours.  Recent Results (from the past 240 hour(s))  Culture, blood (routine x 2)     Status: None   Collection Time: 04/30/19  4:32 PM   Specimen: BLOOD LEFT HAND  Result Value Ref Range Status   Specimen Description BLOOD LEFT HAND  Final   Special Requests   Final    BOTTLES DRAWN AEROBIC AND ANAEROBIC Blood Culture adequate volume   Culture   Final    NO GROWTH 5 DAYS Performed at [facility], [address], [place], [place] [address]    Report Status 05/05/2019 FINAL  Final  Culture, blood (routine x 2)     Status: None    Collection Time: 04/30/19  4:37 PM   Specimen: BLOOD  Result Value Ref Range Status   Specimen Description BLOOD LEFT THUMB  Final   Special Requests   Final    BOTTLES DRAWN AEROBIC AND ANAEROBIC Blood Culture adequate volume   Culture   Final    NO GROWTH 5 DAYS Performed at [facility], [place] [address] [place], [place] [address]    Report Status 05/05/2019 FINAL  Final         Radiology Studies: No results found.      Scheduled Meds:  acetaminophen  1,000 mg Oral Q6H   amLODipine  5 mg Oral Daily   apixaban  5 mg Oral BID   Chlorhexidine Gluconate Cloth  6 each Topical Daily   feeding supplement (ENSURE ENLIVE)  237 mL Oral QID   insulin aspart  0-15 Units Subcutaneous TID WC   latanoprost  1 drop Both Eyes QHS   magnesium chloride  1 tablet Oral Daily   metoprolol succinate  12.5 mg Oral Daily   pantoprazole  40 mg Oral Daily   Continuous Infusions:  cefTRIAXone (ROCEPHIN)  IV 2 g (05/10/19 0815)   lactated ringers 10 mL/hr at 04/27/19 1016   magnesium sulfate bolus IVPB     metronidazole 500 mg (05/10/19 0522)     LOS: 16 days    Time spent: over 30 min    Fayrene Helper, MD [name] Pager AMION  If 7PM-7AM, please contact night-coverage [email or web address] Password [email or web address] 05/10/2019, 9:16 AM

## 2019-05-10 NOTE — Progress Notes (Signed)
Physical Therapy Treatment Patient Details Name: Calvin Burnett MRN: [id number] DOB: 1934-06-11 Today's Date: 05/10/2019    History of Present Illness Calvin Burnett is a 85 y.o. male with PMH: a-fib, alxheimer's dementia, diabetes, GERD, hypertension and prostate cancer who was recently admitted with intra-abdominal abscess due to perforated viscus from April 13, 2009 discharged home on antibiotics. PSH: history of right-sided colectomy and sigmoidoscopy in May 2019. Pt admitted for worsening abd pain. Pt underwent abdominal ex lap on10/13.  Abd xray 04/29/19: concerning for ileusor distal small bowel obstruction.    PT Comments    Patient progressing well towards PT goals. Tolerated gait training without use of RW today and required close Min guard for balance/safety. Balance and endurance improved with use of assistive device. Agreed to use RW for mobility at home to decrease fall risk. Encouraged walking with nursing daily. HR 80s-110 bpm during session. Sitting BP 86/63, standing BP 85/66, post activity BP 102/71. Pt asymptomatic and no dizziness. Will continue to follow.    Follow Up Recommendations  Home health PT;Supervision/Assistance - 24 hour     Equipment Recommendations  Rolling walker with 5" wheels    Recommendations for Other Services       Precautions / Restrictions Precautions Precautions: Fall Precaution Comments: soft BP Restrictions Weight Bearing Restrictions: No    Mobility  Bed Mobility               General bed mobility comments: pt received OOB  Transfers Overall transfer level: Needs assistance Equipment used: Rolling walker (2 wheeled) Transfers: Sit to/from Stand Sit to Stand: Supervision         General transfer comment: Supervision for safety. Stood from Youth worker. Good technique with use of RW, slow to rise.  Ambulation/Gait Ambulation/Gait assistance: Min guard;Supervision Gait Distance (Feet): 300 Feet Assistive device:  Rolling walker (2 wheeled);None Gait Pattern/deviations: Decreased stride length;Step-through pattern Gait velocity: decreased   General Gait Details: Slow, steady gait with use of RW; walked without RW and needed close Min guard for safety. Balance better with use of RW. 2/4 DOE. Sp02 96% on RA. No rest breaks needed.   Stairs             Wheelchair Mobility    Modified Rankin (Stroke Patients Only)       Balance Overall balance assessment: Needs assistance Sitting-balance support: Feet supported;No upper extremity supported Sitting balance-Leahy Scale: Good     Standing balance support: During functional activity Standing balance-Leahy Scale: Fair Standing balance comment: Able to walk without use of RW and needed close Min guard.                            Cognition Arousal/Alertness: Awake/alert Behavior During Therapy: WFL for tasks assessed/performed Overall Cognitive Status: History of cognitive impairments - at baseline Area of Impairment: Orientation                 Orientation Level: Time             General Comments: Per chart pt has baseline alzheimers dementia.      Exercises      General Comments General comments (skin integrity, edema, etc.): HR 80s-110 bpm during session. Sitting BP 86/63, standing BP 85/66, post activity BP 102/71.      Pertinent Vitals/Pain Pain Assessment: No/denies pain    Home Living  Prior Function            PT Goals (current goals can now be found in the care plan section) Progress towards PT goals: Progressing toward goals    Frequency    Min 3X/week      PT Plan Current plan remains appropriate    Co-evaluation              AM-PAC PT "6 Clicks" Mobility   Outcome Measure  Help needed turning from your back to your side while in a flat bed without using bedrails?: A Little Help needed moving from lying on your back to sitting on the side of  a flat bed without using bedrails?: A Little Help needed moving to and from a bed to a chair (including a wheelchair)?: A Little Help needed standing up from a chair using your arms (e.g., wheelchair or bedside chair)?: A Little Help needed to walk in hospital room?: A Little Help needed climbing 3-5 steps with a railing? : A Little 6 Click Score: 18    End of Session Equipment Utilized During Treatment: Gait belt Activity Tolerance: Patient tolerated treatment well Patient left: with call bell/phone within reach;in chair Nurse Communication: Mobility status PT Visit Diagnosis: Muscle weakness (generalized) (M62.81);Difficulty in walking, not elsewhere classified (R26.2)     Time: [id number] PT Time Calculation (min) (ACUTE ONLY): 19 min  Charges:  $Gait Training: 8-22 mins                     Wray Kearns, PT, DPT Acute Rehabilitation Services Pager [phone] Office [place] 05/10/2019, 11:10 AM

## 2019-05-10 NOTE — Progress Notes (Signed)
Patient ambulated in hallway with nursing staff and rolling walker. Patient tolerated well. Back to chair call bell within reach. Will monitor patient. Chaunte Hornbeck, Bettina Gavia RN

## 2019-05-10 NOTE — Care Management Important Message (Signed)
Important Message  Patient Details  Name: Calvin Burnett MRN: [id number] Date of Birth: January 02, 1934   Medicare Important Message Given:  Yes     [name] 05/10/2019, 1:18 PM

## 2019-05-11 LAB — GLUCOSE, CAPILLARY
Glucose-Capillary: 109 mg/dL — ABNORMAL HIGH (ref 70–99)
Glucose-Capillary: 174 mg/dL — ABNORMAL HIGH (ref 70–99)

## 2019-05-11 MED ORDER — METRONIDAZOLE 500 MG PO TABS: 500.0000 mg | ORAL_TABLET | Freq: Three times a day (TID) | ORAL | 0 refills | Status: AC

## 2019-05-11 NOTE — Progress Notes (Signed)
PROGRESS NOTE    Calvin Burnett  [id number] DOB: April 14, 1934 DOA: 04/24/2019 PCP: Marin Olp, MD   Brief Narrative: Calvin Burnett Calvin Burnett 85 y.o.malewith medical history significant ofdiabetes, GERD, hypertension and prostate cancer who was recently admitted with intra-abdominal abscess due to perforated viscus from April 13, 2009 discharged home on antibiotics, history of Calvin Burnett. fib, currently on Eliquis. Hypertension GERD and Alzheimer's dementia, history of right-sided colectomy and sigmoidoscopy in May 2019, pneumoperitoneum who presented again with abdominal pain as well as fever. Patient had temperature up to 102. He was on Eliquis and no surgery was done previous admission. His abscess was deemed not drainable at the time. He was found to have worsening intra-abdominal abscess. Patient has not been on antibiotics in the last few days.   Upon arrival to ED, his temperature was 100.5 blood pressure 100/67 pulse 98 respiratory 24 oxygen sat 95% on room air. Sodium 135 potassium 3.3 chloride 103 CO2 22 glucose 142. Creatinine 1.27 calcium 7.2. Albumin 2.0. White count 17.9 platelet lites 406 and hemoglobin 12.8. COVID-19 screen is negative. CT abdomen pelvis shows interval development of multiple intra-abdominal abscesses measure approximately 6.2 x 5.2 x 7.1 cm. Multiple thick-walled loops of small bowel in the mid abdomen small volume pneumoperitoneum which is decreased from previous small bilateral pleural effusions with bladder wall thickening.   He was admitted under [place].  Surgery was consulted.  He underwent intra-abdominal drain placement by IR on 04/25/2019.  He was continued on broad-spectrum IV antibiotics.  Underwent exploratory laparotomy on 04/27/2019.  Patient's initial blood pressure which were drawn on 04/24/2019 turned out to be positive with Streptococcus constellatus on 04/28/2019.  Patient's antibiotics were de-escalated to Rocephin 2 g IV and Flagyl were  continued.  Transthoracic echo obtained on 04/29/2019 did not show any signs of endocarditis.  Discussed with ID and per their recommendation, spoke to cardiology.  TEE planned for 10/20, but failed.  Assessment & Plan:   Principal Problem:   Intra-abdominal abscess ([place]) Active Problems:   Essential hypertension   GERD (gastroesophageal reflux disease)   Type 2 diabetes mellitus ([name])   Prostate cancer ([place])   Hyperlipidemia associated with type 2 diabetes mellitus ([place])   CKD (chronic kidney disease), stage III ([name])   AF (paroxysmal atrial fibrillation) ([place])   Perforation bowel ([place])   Dementia ([place])   Pneumoperitoneum  Plan for d/c on 10/24, but home health unable to be set up unfortunately - complete abx in house.   Intra-abdominal abscesses/gram-positive bacteremia:recurrent infections. Ct abd: Interval development of multiple intra-abdominal abscesses as detailed above measuring up to approximately 6.2 x 5.2 by 7.1 cm. status post intra-abdominal drainage placed by IR on 04/25/2019.  He then underwent exploratory laparotomy on 04/27/2019. - Blood cx 10/10 -> strep constellatus  - repeat blood cx from 10/16 NGTD - Surgical cx with mixed anaerobic flora, multiple organisms - TEE failed on 10/20, unable to pass TEE probe - will follow with barium swallow (no evidence for stricture or mass lesion) - Discussed with ID, Dr. Baxter Flattery on 10/23, TTE without evidence of vegetations, recommended 14 days abx therapy for strep constellatus bacteremia (on discussion with Dr. Baxter Flattery, plan to complete therapy on 10/27 with ceftriaxone and oral flagyl (which is 2 weeks from ex lap on 10/13).  He's completed ceftriaxone.  Can be discharged with 2 doses of oral flagyl to complete today).  PICC should be removed when therapy is complete. - opat orders placed today - TNA d/c'd, pt now  on soft diet  Diabetes mellitus type II: Follow blood sugars, stable.  Continue SSI.  He's on metformin at home,  resume at discharge.  Dementia:Stable.  He is alert and oriented.  Chronic kidney disease stage [id number] at baseline. Continue monitoring  Paroxysmal atrial fibrillation:Rate is controlled.  Eliquis was resumed on 05/02/2019.  Continue metoprolol.  Hypertension: Blood pressure very well controlled.  Continue current regimen.  SBP in 90's on day of discharge.  BP appropriate at time of discharge.  Continue amlodipine and metoprolol.  Electrolyte Abnormalities: suspect this was lab error (may have been dilute with IVF).  Repeat WNL.   DVT prophylaxis: eliquis Code Status: full code Family Communication: none at bedside - daughter over phone on 10/23  Disposition Plan: pending further imrpovement, completion of abx therapy - 10/27   Consultants:   Surgery  ID over phone  IR  Procedures:   Intra abdominal drain placement 10/11  Ex lap on 10/13  Antimicrobials:  Anti-infectives (From admission, onward)   Start     Dose/Rate Route Frequency Ordered Stop   05/07/19 0000  cefTRIAXone (ROCEPHIN) IVPB  Status:  Discontinued     2 g Intravenous Every 24 hours 05/07/19 1537 05/11/19    05/07/19 0000  metroNIDAZOLE (FLAGYL) 500 MG tablet  Status:  Discontinued     500 mg Oral 3 times daily 05/07/19 1619 05/11/19    05/04/19 0830  metroNIDAZOLE (FLAGYL) IVPB 500 mg     500 mg 100 mL/hr over 60 Minutes Intravenous Every 8 hours 05/04/19 0740 05/11/19 2359   05/04/19 0800  cefTRIAXone (ROCEPHIN) 2 g in sodium chloride 0.9 % 100 mL IVPB     2 g 200 mL/hr over 30 Minutes Intravenous Every 24 hours 05/04/19 0740 05/11/19 0928   04/29/19 1600  cefTRIAXone (ROCEPHIN) 2 g in sodium chloride 0.9 % 100 mL IVPB  Status:  Discontinued     2 g 200 mL/hr over 30 Minutes Intravenous Every 24 hours 04/29/19 1557 05/01/19 0900   04/29/19 1600  metroNIDAZOLE (FLAGYL) IVPB 500 mg  Status:  Discontinued     500 mg 100 mL/hr over 60 Minutes Intravenous Every 8 hours 04/29/19 1557 05/01/19  1341   04/27/19 1700  piperacillin-tazobactam (ZOSYN) IVPB 3.375 g  Status:  Discontinued     3.375 g 12.5 mL/hr over 240 Minutes Intravenous Every 8 hours 04/27/19 1640 04/29/19 1557   04/25/19 2200  vancomycin (VANCOCIN) 1,250 mg in sodium chloride 0.9 % 250 mL IVPB  Status:  Discontinued     1,250 mg 166.7 mL/hr over 90 Minutes Intravenous Every 24 hours 04/25/19 0158 04/27/19 1640   04/25/19 2100  ceFEPIme (MAXIPIME) 2 g in sodium chloride 0.9 % 100 mL IVPB  Status:  Discontinued     2 g 200 mL/hr over 30 Minutes Intravenous Every 24 hours 04/24/19 2213 04/25/19 0900   04/25/19 1400  ceFEPIme (MAXIPIME) 2 g in sodium chloride 0.9 % 100 mL IVPB  Status:  Discontinued     2 g 200 mL/hr over 30 Minutes Intravenous Every 12 hours 04/25/19 1341 04/27/19 1640   04/25/19 1000  cefTRIAXone (ROCEPHIN) 2 g in sodium chloride 0.9 % 100 mL IVPB  Status:  Discontinued     2 g 200 mL/hr over 30 Minutes Intravenous Every 24 hours 04/25/19 0841 04/25/19 1335   04/25/19 0015  metroNIDAZOLE (FLAGYL) IVPB 500 mg  Status:  Discontinued     500 mg 100 mL/hr over 60 Minutes Intravenous Every 8 hours 04/25/19 0006  04/27/19 1640   04/25/19 0000  vancomycin (VANCOCIN) 1,500 mg in sodium chloride 0.9 % 500 mL IVPB     1,500 mg 250 mL/hr over 120 Minutes Intravenous  Once 04/24/19 2355 04/25/19 0248   04/24/19 2215  ceFEPIme (MAXIPIME) 2 g in sodium chloride 0.9 % 100 mL IVPB  Status:  Discontinued     2 g 200 mL/hr over 30 Minutes Intravenous Every 24 hours 04/24/19 2211 04/24/19 2213   04/24/19 2100  ceFEPIme (MAXIPIME) 2 g in sodium chloride 0.9 % 100 mL IVPB     2 g 200 mL/hr over 30 Minutes Intravenous  Once 04/24/19 2046 04/24/19 2154   04/24/19 2100  metroNIDAZOLE (FLAGYL) IVPB 500 mg     500 mg 100 mL/hr over 60 Minutes Intravenous  Once 04/24/19 2046 04/24/19 2223     Subjective: No complaints.   Objective: Vitals:   05/10/19 1004 05/10/19 1514 05/10/19 2102 05/11/19 0604  BP: 102/71 129/66  108/69 (!) 134/92  Pulse:   78 85  Resp: 18 (!) 23 20 (!) 21  Temp:   98 F (36.7 C) 98.6 F (37 C)  TempSrc:   Oral Oral  SpO2:   98% 96%  Weight:      Height:        Intake/Output Summary (Last 24 hours) at 05/11/2019 1028 Last data filed at 05/11/2019 0859 Gross per 24 hour  Intake 740.03 ml  Output 1450 ml  Net -709.97 ml   Filed Weights   04/27/19 0500 05/03/19 0402 05/09/19 0526  Weight: 84.2 kg 82 kg 75.2 kg    Examination:  General: No acute distress. Cardiovascular: Heart sounds show Calvin Burnett regular rate, and rhythm.  Lungs: Clear to auscultation bilaterally Abdomen: Soft, nontender, nondistended.  Incision well appearing with steri strips. Neurological: Alert and oriented 3. Moves all extremities 4 with equal strength. Cranial nerves II through XII grossly intact. Skin: Warm and dry. No rashes or lesions. Extremities: No clubbing or cyanosis. No edema.   Data Reviewed: I have personally reviewed following labs and imaging studies  CBC: Recent Labs  Lab 05/05/19 0500 05/06/19 0512 05/07/19 0407 05/08/19 0415 05/10/19 0530  WBC 11.8* 10.6* 10.7* 11.3* 8.8  NEUTROABS 8.3*  --   --   --   --   HGB 9.2* 9.0* 8.8* 9.5* 9.5*  HCT 27.2* 27.8* 26.4* 28.3* 27.5*  MCV 94.1 95.5 94.3 93.1 92.3  PLT 304 332 358 455* [id number]*   Basic Metabolic Panel: Recent Labs  Lab 05/06/19 0512 05/07/19 0407 05/08/19 0415 05/08/19 0933 05/10/19 0530  NA 136 135 135 136 136  K 4.7 4.1 5.3* 4.1 4.0  CL 104 104 102 103 104  CO2 25 24 22 23 24   GLUCOSE 116* 134* 207* 132* 114*  BUN 22 23 20 20 19   CREATININE 1.20 1.21 1.15 0.98 1.02  CALCIUM 7.5* 7.5* 5.9* 8.7* 7.9*  MG 1.8 1.8 1.3* 1.9  --    GFR: Estimated Creatinine Clearance: 56.7 mL/min (by C-G formula based on SCr of 1.02 mg/dL). Liver Function Tests: Recent Labs  Lab 05/06/19 0512 05/07/19 0407 05/08/19 0415  AST 24 24 33  ALT 17 16 20   ALKPHOS 109 129* 180*  BILITOT 0.5 0.4 0.5  PROT 5.0* 5.2* 5.7*  ALBUMIN  1.8* 1.8* 1.9*   No results for input(s): LIPASE, AMYLASE in the last 168 hours. No results for input(s): AMMONIA in the last 168 hours. Coagulation Profile: No results for input(s): INR, PROTIME in the last 168  hours. Cardiac Enzymes: No results for input(s): CKTOTAL, CKMB, CKMBINDEX, TROPONINI in the last 168 hours. BNP (last 3 results) Recent Labs    01/29/19 0852  PROBNP 165.0*   HbA1C: No results for input(s): HGBA1C in the last 72 hours. CBG: Recent Labs  Lab 05/10/19 0629 05/10/19 1213 05/10/19 1622 05/10/19 2126 05/11/19 0620  GLUCAP 102* 190* 159* 130* 109*   Lipid Profile: No results for input(s): CHOL, HDL, LDLCALC, TRIG, CHOLHDL, LDLDIRECT in the last 72 hours. Thyroid Function Tests: No results for input(s): TSH, T4TOTAL, FREET4, T3FREE, THYROIDAB in the last 72 hours. Anemia Panel: No results for input(s): VITAMINB12, FOLATE, FERRITIN, TIBC, IRON, RETICCTPCT in the last 72 hours. Sepsis Labs: No results for input(s): PROCALCITON, LATICACIDVEN in the last 168 hours.  No results found for this or any previous visit (from the past 240 hour(s)).       Radiology Studies: No results found.      Scheduled Meds:  acetaminophen  1,000 mg Oral Q6H   amLODipine  5 mg Oral Daily   apixaban  5 mg Oral BID   Chlorhexidine Gluconate Cloth  6 each Topical Daily   feeding supplement (ENSURE ENLIVE)  237 mL Oral QID   insulin aspart  0-15 Units Subcutaneous TID WC   latanoprost  1 drop Both Eyes QHS   metoprolol succinate  12.5 mg Oral Daily   pantoprazole  40 mg Oral Daily   Continuous Infusions:  lactated ringers 10 mL/hr at 04/27/19 1016   magnesium sulfate bolus IVPB     metronidazole 500 mg (05/11/19 0608)     LOS: 17 days    Time spent: over 69 min    Fayrene Helper, MD [name] Pager [email or web address]  If 7PM-7AM, please contact night-coverage [email or web address] Password [email or web address] 05/11/2019, 10:28 AM

## 2019-05-11 NOTE — Progress Notes (Signed)
Patient ID: Calvin Burnett, male   DOB: 1934/04/30, 85 y.o.   MRN: [id number]    7 Days Post-Op  Subjective: No new issues.  Eating diet and moving bowels.  No pain  Objective: Vital signs in last 24 hours: Temp:  [98 F (36.7 C)-98.6 F (37 C)] 98.6 F (37 C) (10/27 0604) Pulse Rate:  [78-119] 85 (10/27 0604) Resp:  [18-23] 21 (10/27 0604) BP: (102-134)/(66-92) 134/92 (10/27 0604) SpO2:  [91 %-98 %] 96 % (10/27 0604) Last BM Date: 05/10/19  Intake/Output from previous day: 10/26 0701 - 10/27 0700 In: 500 [IV Piggyback:500] Out: 1300 [Urine:1300] Intake/Output this shift: No intake/output data recorded.  PE: Abd: soft, NT, Nd, incision is healing well with steri-strips in place.  Lab Results:  Recent Labs    05/10/19 0530  WBC 8.8  HGB 9.5*  HCT 27.5*  PLT 454*   BMET Recent Labs    05/08/19 0933 05/10/19 0530  NA 136 136  K 4.1 4.0  CL 103 104  CO2 23 24  GLUCOSE 132* 114*  BUN 20 19  CREATININE 0.98 1.02  CALCIUM 8.7* 7.9*   PT/INR No results for input(s): LABPROT, INR in the last 72 hours. CMP     Component Value Date/Time   NA 136 05/10/2019 0530   K 4.0 05/10/2019 0530   CL 104 05/10/2019 0530   CO2 24 05/10/2019 0530   GLUCOSE 114 (H) 05/10/2019 0530   BUN 19 05/10/2019 0530   CREATININE 1.02 05/10/2019 0530   CALCIUM 7.9 (L) 05/10/2019 0530   PROT 5.7 (L) 05/08/2019 0415   ALBUMIN 1.9 (L) 05/08/2019 0415   AST 33 05/08/2019 0415   ALT 20 05/08/2019 0415   ALKPHOS 180 (H) 05/08/2019 0415   BILITOT 0.5 05/08/2019 0415   GFRNONAA >60 05/10/2019 0530   GFRAA >60 05/10/2019 0530   Lipase     Component Value Date/Time   LIPASE 42 04/24/2019 1848       Studies/Results: No results found.  Anti-infectives: Anti-infectives (From admission, onward)   Start     Dose/Rate Route Frequency Ordered Stop   05/07/19 0000  cefTRIAXone (ROCEPHIN) IVPB     2 g Intravenous Every 24 hours 05/07/19 1537 05/11/19 2359   05/07/19 0000   metroNIDAZOLE (FLAGYL) 500 MG tablet     500 mg Oral 3 times daily 05/07/19 1619 05/11/19 2359   05/04/19 0830  metroNIDAZOLE (FLAGYL) IVPB 500 mg     500 mg 100 mL/hr over 60 Minutes Intravenous Every 8 hours 05/04/19 0740     05/04/19 0800  cefTRIAXone (ROCEPHIN) 2 g in sodium chloride 0.9 % 100 mL IVPB     2 g 200 mL/hr over 30 Minutes Intravenous Every 24 hours 05/04/19 0740     04/29/19 1600  cefTRIAXone (ROCEPHIN) 2 g in sodium chloride 0.9 % 100 mL IVPB  Status:  Discontinued     2 g 200 mL/hr over 30 Minutes Intravenous Every 24 hours 04/29/19 1557 05/01/19 0900   04/29/19 1600  metroNIDAZOLE (FLAGYL) IVPB 500 mg  Status:  Discontinued     500 mg 100 mL/hr over 60 Minutes Intravenous Every 8 hours 04/29/19 1557 05/01/19 1341   04/27/19 1700  piperacillin-tazobactam (ZOSYN) IVPB 3.375 g  Status:  Discontinued     3.375 g 12.5 mL/hr over 240 Minutes Intravenous Every 8 hours 04/27/19 1640 04/29/19 1557   04/25/19 2200  vancomycin (VANCOCIN) 1,250 mg in sodium chloride 0.9 % 250 mL IVPB  Status:  Discontinued     1,250 mg 166.7 mL/hr over 90 Minutes Intravenous Every 24 hours 04/25/19 0158 04/27/19 1640   04/25/19 2100  ceFEPIme (MAXIPIME) 2 g in sodium chloride 0.9 % 100 mL IVPB  Status:  Discontinued     2 g 200 mL/hr over 30 Minutes Intravenous Every 24 hours 04/24/19 2213 04/25/19 0900   04/25/19 1400  ceFEPIme (MAXIPIME) 2 g in sodium chloride 0.9 % 100 mL IVPB  Status:  Discontinued     2 g 200 mL/hr over 30 Minutes Intravenous Every 12 hours 04/25/19 1341 04/27/19 1640   04/25/19 1000  cefTRIAXone (ROCEPHIN) 2 g in sodium chloride 0.9 % 100 mL IVPB  Status:  Discontinued     2 g 200 mL/hr over 30 Minutes Intravenous Every 24 hours 04/25/19 0841 04/25/19 1335   04/25/19 0015  metroNIDAZOLE (FLAGYL) IVPB 500 mg  Status:  Discontinued     500 mg 100 mL/hr over 60 Minutes Intravenous Every 8 hours 04/25/19 0006 04/27/19 1640   04/25/19 0000  vancomycin (VANCOCIN) 1,500 mg in  sodium chloride 0.9 % 500 mL IVPB     1,500 mg 250 mL/hr over 120 Minutes Intravenous  Once 04/24/19 2355 04/25/19 0248   04/24/19 2215  ceFEPIme (MAXIPIME) 2 g in sodium chloride 0.9 % 100 mL IVPB  Status:  Discontinued     2 g 200 mL/hr over 30 Minutes Intravenous Every 24 hours 04/24/19 2211 04/24/19 2213   04/24/19 2100  ceFEPIme (MAXIPIME) 2 g in sodium chloride 0.9 % 100 mL IVPB     2 g 200 mL/hr over 30 Minutes Intravenous  Once 04/24/19 2046 04/24/19 2154   04/24/19 2100  metroNIDAZOLE (FLAGYL) IVPB 500 mg     500 mg 100 mL/hr over 60 Minutes Intravenous  Once 04/24/19 2046 04/24/19 2223       Assessment/Plan A fib, onEliquis Alzheimer's dementia HTN HxT1N0colon cancer of hepatic flexure, sigmoid stricture S/P robotic LOA, right colectomy, sigmoidectomy 11/26/17 CKD Strep Constellatus bacteremia-unable to do TEE, completing abx course as per ID and medicine  POD13,S/p exploratory laparotomy/ LOA/ SB resection with anastomosis 10/13 - [place] -soft diet andensurefor additional supplementation.  -finish abx tomorrow and plan for home at that time -surgically stable  FEN -soft diet,ensure VTE -Eliquis ID -Rocephin/Flagyl,end on 10/27 Dispo -HH with daughter this week - clear for discharge from our standpoint if ok with ID   LOS: 34 days   Sharon Mt. Dema Severin, M.D. [facility]

## 2019-05-11 NOTE — TOC Transition Note (Signed)
Transition of Care [facility]) - CM/SW Discharge Note Marvetta Gibbons RN, BSN Transitions of Care Unit 4E- RN Case Manager [phone]   Patient Details  Name: Calvin Burnett MRN: [id number] Date of Birth: 1934/04/14  Transition of Care [facility]) CM/SW Contact:  Dawayne Patricia, RN Phone Number: 05/11/2019, 12:02 PM   Clinical Narrative:    Pt stable for transition home after completing IV abx today. Spoke with [facility] PA- Michel Bickers. Regarding HH needs- pt will not need RN at this time only PT/OT- HH orders modified to reflect current needs for PT/OT with verbal order. Have updated Cassie with [facility] needs- [facility] can accept referral at this time for PT/OT. Per bedside RN- DME- 3n1 and RW has been delivered to bedside for discharge home.    Final next level of care: [place] Barriers to Discharge: Barriers Resolved   Patient Goals and CMS Choice Patient states their goals for this hospitalization and ongoing recovery are:: to return home with family(per son Ronalee Belts) CMS Medicare.gov Compare Post Acute Care list provided to:: Patient Represenative (must comment) Choice offered to / list presented to : Patient, Adult Children  Discharge Placement             Home with [facility]          Discharge Plan and Services In-house Referral: Clinical Social Work Discharge Planning Services: CM Consult            DME Arranged: 3-N-1, [place] rolling DME Agency: [employer]       HH Arranged: PT, OT HH Agency: [facility] Date [place]: 05/11/19 Time [place]: 1202 Representative spoke with at [place] ([place]) Interventions     Readmission Risk Interventions Readmission Risk Prevention Plan 05/11/2019  Transportation Screening Complete  PCP or Specialist Appt within 3-5 Days Complete  HRI or [place] Complete  Social Work Consult for [place] Planning/Counseling Complete   Palliative Care Screening Not Applicable  Medication Review Press photographer) Complete  Some recent data might be hidden

## 2019-05-11 NOTE — Progress Notes (Signed)
Discharge instructions given to patient. Medications and wound care reviewed. All questions answered. IV removed, clean, dry and intact. Patient to be escorted home with daughter.  Arletta Bale, RN

## 2019-05-12 ENCOUNTER — Telehealth: Payer: Self-pay | Admitting: Family Medicine

## 2019-05-12 NOTE — Telephone Encounter (Signed)
Not sure where you would like me to put?

## 2019-05-12 NOTE — Telephone Encounter (Signed)
Pt called back, requesting call back from scheduling dept for TCM and soonest options

## 2019-05-12 NOTE — Telephone Encounter (Signed)
Copied from [place] [id number]. Topic: General - Other >> May 12, 2019 11:27 AM Jodie Echevaria wrote: Reason for CRM: Patient daughter Lillia Carmel called to schedule him an appointment for a hospital follow up patient was just released from hospital on 05/11/2019 but first available is 06/14/2019. She is asking for something earlier or if Dr Yong Channel will be ok with him seeing a different provider for his follow up. Please call [name] at Ph# [phone]

## 2019-05-13 DIAGNOSIS — M6281 Muscle weakness (generalized): Secondary | ICD-10-CM | POA: Diagnosis not present

## 2019-05-13 DIAGNOSIS — K651 Peritoneal abscess: Secondary | ICD-10-CM | POA: Diagnosis not present

## 2019-05-13 NOTE — Telephone Encounter (Signed)
See below

## 2019-05-13 NOTE — Telephone Encounter (Signed)
Called and left message for patient or daughter to return call to discuss appointment and hospital discharge.

## 2019-05-13 NOTE — Telephone Encounter (Signed)
Patient's daughter calling to inquire when patient is supposed to come in tomorrow. She states she spoke with nurse who told her that his appointment would be sometime tomorrow with [name].

## 2019-05-13 NOTE — Telephone Encounter (Signed)
1:20 today

## 2019-05-13 NOTE — Telephone Encounter (Signed)
Called and l/m to let her know arrival time is 430.

## 2019-05-13 NOTE — Telephone Encounter (Signed)
Patient's wife is returning a call to Dr. Ansel Bong nurse that she missed this morning.  Please call back at [phone]

## 2019-05-13 NOTE — Telephone Encounter (Signed)
Spoke to daughter app made.

## 2019-05-14 ENCOUNTER — Inpatient Hospital Stay: Payer: PPO | Admitting: Family Medicine

## 2019-05-17 ENCOUNTER — Ambulatory Visit (INDEPENDENT_AMBULATORY_CARE_PROVIDER_SITE_OTHER): Payer: PPO | Admitting: Family Medicine

## 2019-05-17 ENCOUNTER — Encounter: Payer: Self-pay | Admitting: Family Medicine

## 2019-05-17 ENCOUNTER — Other Ambulatory Visit: Payer: Self-pay

## 2019-05-17 VITALS — BP 110/80 | HR 94 | Temp 98.0°F | Ht 70.0 in | Wt 154.0 lb

## 2019-05-17 DIAGNOSIS — I10 Essential (primary) hypertension: Secondary | ICD-10-CM

## 2019-05-17 DIAGNOSIS — E1122 Type 2 diabetes mellitus with diabetic chronic kidney disease: Secondary | ICD-10-CM

## 2019-05-17 DIAGNOSIS — I480 Paroxysmal atrial fibrillation: Secondary | ICD-10-CM | POA: Diagnosis not present

## 2019-05-17 DIAGNOSIS — R7881 Bacteremia: Secondary | ICD-10-CM

## 2019-05-17 DIAGNOSIS — K651 Peritoneal abscess: Secondary | ICD-10-CM

## 2019-05-17 DIAGNOSIS — K668 Other specified disorders of peritoneum: Secondary | ICD-10-CM

## 2019-05-17 DIAGNOSIS — F0150 Vascular dementia without behavioral disturbance: Secondary | ICD-10-CM

## 2019-05-17 DIAGNOSIS — N183 Chronic kidney disease, stage 3 unspecified: Secondary | ICD-10-CM

## 2019-05-17 LAB — CBC WITH DIFFERENTIAL/PLATELET
Basophils Absolute: 0 10*3/uL (ref 0.0–0.1)
Basophils Relative: 0.3 % (ref 0.0–3.0)
Eosinophils Absolute: 0 10*3/uL (ref 0.0–0.7)
Eosinophils Relative: 0.3 % (ref 0.0–5.0)
HCT: 35.1 % — ABNORMAL LOW (ref 39.0–52.0)
Hemoglobin: 11.6 g/dL — ABNORMAL LOW (ref 13.0–17.0)
Lymphocytes Relative: 33.1 % (ref 12.0–46.0)
Lymphs Abs: 3.4 10*3/uL (ref 0.7–4.0)
MCHC: 33.2 g/dL (ref 30.0–36.0)
MCV: 90.9 fl (ref 78.0–100.0)
Monocytes Absolute: 0.4 10*3/uL (ref 0.1–1.0)
Monocytes Relative: 4.3 % (ref 3.0–12.0)
Neutro Abs: 6.3 10*3/uL (ref 1.4–7.7)
Neutrophils Relative %: 62 % (ref 43.0–77.0)
Platelets: 551 10*3/uL — ABNORMAL HIGH (ref 150.0–400.0)
RBC: 3.86 Mil/uL — ABNORMAL LOW (ref 4.22–5.81)
RDW: 15.6 % — ABNORMAL HIGH (ref 11.5–15.5)
WBC: 10.1 10*3/uL (ref 4.0–10.5)

## 2019-05-17 LAB — COMPREHENSIVE METABOLIC PANEL
ALT: 28 U/L (ref 0–53)
AST: 45 U/L — ABNORMAL HIGH (ref 0–37)
Albumin: 3.2 g/dL — ABNORMAL LOW (ref 3.5–5.2)
Alkaline Phosphatase: 149 U/L — ABNORMAL HIGH (ref 39–117)
BUN: 18 mg/dL (ref 6–23)
CO2: 26 mEq/L (ref 19–32)
Calcium: 8.3 mg/dL — ABNORMAL LOW (ref 8.4–10.5)
Chloride: 100 mEq/L (ref 96–112)
Creatinine, Ser: 1.08 mg/dL (ref 0.40–1.50)
GFR: 65.22 mL/min (ref >60.00)
Glucose, Bld: 126 mg/dL — ABNORMAL HIGH (ref 70–99)
Potassium: 3.7 mEq/L (ref 3.5–5.1)
Sodium: 136 mEq/L (ref 135–145)
Total Bilirubin: 0.5 mg/dL (ref 0.2–1.2)
Total Protein: 6.8 g/dL (ref 6.0–8.3)

## 2019-05-17 NOTE — Progress Notes (Signed)
Phone [phone]  Subjective:  In person visit Calvin Burnett is a 85 y.o. year old very pleasant male patient who presents for/with See problem oriented charting Chief Complaint  Patient presents with  . Follow-up  . hospital-intra-abdominal abscess     ROS-no fever/chills.  Some fatigue but improving.  No nausea or vomiting.  Able to tolerate p.o.  Abdominal pain improving  Past Medical History-  Patient Active Problem List   Diagnosis Date Noted  . Intra-abdominal abscess ([place]) 04/24/2019    Priority: High  . B12 deficiency 12/04/2017    Priority: High  . pT1pN0 colon cancer s/p robotic right colectomy 11/26/2017 10/02/2017    Priority: High  . AF (paroxysmal atrial fibrillation) ([place]) 04/09/2017    Priority: High  . Night sweats 09/16/2016    Priority: High  . Major depressive disorder with single episode, in full remission ([place]) 07/29/2016    Priority: High  . Rectal pain 10/14/2014    Priority: High  . Prostate cancer ([place]) 08/12/2012    Priority: High  . Type 2 diabetes mellitus ([place]) 07/31/2010    Priority: High  . Vascular dementia ([place]) 07/27/2008    Priority: High  . History of adenomatous polyp of colon 10/27/2014    Priority: Medium  . Syncope 09/27/2014    Priority: Medium  . CKD (chronic kidney disease), stage III ([place]) 06/01/2014    Priority: Medium  . Hyperlipidemia associated with type 2 diabetes mellitus ([place]) 03/01/2014    Priority: Medium  . Basal cell carcinoma 05/03/2009    Priority: Medium  . Essential hypertension 01/30/2007    Priority: Medium  . Aortic atherosclerosis ([place]) 01/29/2019    Priority: Low  . Stricture of sigmoid s/p robotic sigmoidectomy 11/26/2017 11/26/2017    Priority: Low  . Chronic anticoagulation 11/26/2017    Priority: Low  . Right inguinal hernia 10/02/2017    Priority: Low  . Bowel habit changes 10/14/2014    Priority: Low  . Bradycardia 09/27/2014    Priority: Low  . LEG CRAMPS, NOCTURNAL 04/30/2010     Priority: Low  . Cervical spondylosis without myelopathy 12/11/2007    Priority: Low  . GERD (gastroesophageal reflux disease) 11/05/2007    Priority: Low    Medications- reviewed and updated Current Outpatient Medications  Medication Sig Dispense Refill  . acetaminophen (TYLENOL) 500 MG tablet Take 500 mg by mouth daily as needed for moderate pain or headache.    Marland Kitchen amLODipine (NORVASC) 5 MG tablet Take 5 mg by mouth daily.    Marland Kitchen apixaban (ELIQUIS) 5 MG TABS tablet Take 1 tablet (5 mg total) by mouth 2 (two) times daily. 60 tablet 11  . latanoprost (XALATAN) 0.005 % ophthalmic solution Place 1 drop into both eyes at bedtime.   12  . memantine (NAMENDA XR) 28 MG CP24 24 hr capsule TAKE 1 CAPSULE (28 MG TOTAL) BY MOUTH DAILY. 90 capsule 3  . metFORMIN (GLUCOPHAGE-XR) 500 MG 24 hr tablet TAKE 1 TABLET BY MOUTH EVERY DAY WITH BREAKFAST (Patient taking differently: Take 500 mg by mouth daily with breakfast. ) 30 tablet 5  . metoprolol succinate (TOPROL-XL) 25 MG 24 hr tablet TAKE 1/2 TABLET BY MOUTH EVERY DAY (Patient taking differently: Take 12.5 mg by mouth daily. ) 45 tablet 1   No current facility-administered medications for this visit.      Objective:  BP 110/80   Pulse 94   Temp 98 F (36.7 C)   Ht 5\' 10"  (1.778 m)   Wt  154 lb (69.9 kg)   SpO2 96%   BMI 22.10 kg/m  Gen: NAD, resting comfortably CV: RRR no murmurs rubs or gallops Lungs: CTAB no crackles, wheeze, rhonchi Abdomen: soft/nontender/nondistended/normal bowel sounds. No rebound or guarding.  Healing midline wound from exploratory laparotomy.  Mild erythema right at line of boxers but without warmth (appears to be healing well but advised him to keep an eye on this portion) Ext: no edema Skin: warm, dry Neuro: needs some assist to stand and to get on to table. Walks slowly but is stable withotu support.     Assessment and Plan    #Hospital follow-up-prolonged 17-day hospitalization for intra-abdominal abscess and  bacteremia S: Patient has a history of right-sided colectomy for colon cancer in May 2019.  He had recently been admitted for pneumoperitoneum with intra-abdominal abscess which at that time was thought not to be drainable in late September-was discharged home but came back when he was found to have fever to 102.  He was readmitted with worsening intra-abdominal abscess.  CT of the abdomen pelvis showed multiple intra-abdominal abscesses measuring approximately 6.2 x 5.2 x 7.1 cm.  Patient also had multiple thick-walled loops of small bowel in the midabdomen as well as small volume pneumoperitoneum but overall pneumoperitoneum have decreased in size from prior CT.  Patient also had elevated white count to 17.9 thousand and mild anemia to 12.8 thousand.  COVID-19 screen was negative.  Surgery was consulted. Patient had exploratory laparotomy with small bowel resection and anastomosis on October 13 after IR drain placed revealed enteric contents.  Postoperatively patient did well and bowel function returned to normal and diet was advanced.  He was sent home with home health physical therapy and Occupational Therapy- have only had PT so far per daughter. Initial plan was SNF but they were able to avoid that with family support and took him home.   Initial blood cultures grew strep Constellatus bacteremia.  He was treated with ceftriaxone/Flagyl.  Cardiology was consulted for TEE but there was difficulty passing the scope and this was not able to be completed.  Patient had a barium swallow study which showed no acute findings.  White blood cells normalized and patient was afebrile by discharge.  Patient completed course of ceftriaxone on 10/27 including 2 doses at home.   Good family support Daughter is working with him- has taken time off work to help. Has been able to walk 3 blocks as of yesterday. He is dressing himself, bathing. Appetite good. Still some weakness.   Chronic conditions were stable while  hospitalized 1.  Atrial fibrillation-Eliquis was held during hospitalization due to procedures but restarted upon discharge.  He was rate controlled on metoprolol  2.  Vascular dementia-no issues during hospitalization.  Patient was continued on Namenda 3.  Hypertension-patient stayed on Norvasc and metoprolol during hospitalization and well controlled  4.  CKD stage III-stable during hospitalization, update today  5.  Diabetes-patient was discharged on Metformin.  A1c in acceptable range during hospitalization Lab Results  Component Value Date   HGBA1C 6.7 (H) 04/14/2019   HGBA1C 6.7 (H) 12/31/2018   HGBA1C 8.1 (H) 06/22/2018  A/P:   Intra-abdominal abscess ([name])-patient appears to be healing well.  Wound appears to be healing and he also has another visit next week with surgery  Bacteremia-TEE was unsuccessful.  Patient completed 2-week course of ceftriaxone and also Flagyl  Vascular dementia without behavioral disturbance ([name])-stable-continue Namenda  Type 2 diabetes mellitus with chronic kidney disease, without long-term  current use of insulin, unspecified CKD stage ([name])-well controlled during hospitalization.  His Metformin is not on recall.  Stable-continue current medication with A1c under 7.  Advised 58-month follow-up  AF (paroxysmal atrial fibrillation) ([name])-anticoagulated with Eliquis and rate controlled on metoprolol  Essential hypertension-well controlled today-continue current medications  Stage 3 chronic kidney disease, unspecified whether stage 3a or 3b CKD-likely stable.  Update CMP today.  Recommended follow up: 59-month diabetes follow-up or sooner if needed  Lab/Order associations:   ICD-10-CM   1. Intra-abdominal abscess ([place])  K65.1 CBC with Differential/Platelet    Comprehensive metabolic panel  2. Bacteremia  R78.81   3. Vascular dementia without behavioral disturbance ([name])  F01.50   4. Type 2 diabetes mellitus with chronic kidney disease, without long-term  current use of insulin, unspecified CKD stage ([name])  E11.22   5. AF (paroxysmal atrial fibrillation) ([name])  I48.0   6. Essential hypertension  I10   7. Stage 3 chronic kidney disease, unspecified whether stage 3a or 3b CKD  N18.30   8. Pneumoperitoneum  K66.8    Return precautions advised.  [name], MD

## 2019-05-17 NOTE — Patient Instructions (Addendum)
Health Maintenance Due  Topic Date Due  . OPHTHALMOLOGY EXAM - you are now due for eye exam- have them send [place] a copy after you get this done 04/07/2019   Thrilled you are doing so well! Please let us know if you need anything but I think you are going in right direction and glad you have visit next week with the surgeon   Please stop by lab before you go If you do not have mychart- we will call you about results within 5 business days of [place] receiving them.  If you have mychart- we will send your results within 3 business days of [place] receiving them.  If abnormal or we want to clarify a result, we will call or mychart you to make sure you receive the message.  If you have questions or concerns or don't hear within 5-7 days, please send [place] a message or call us.

## 2019-05-18 DIAGNOSIS — K651 Peritoneal abscess: Secondary | ICD-10-CM | POA: Diagnosis not present

## 2019-05-18 DIAGNOSIS — M6281 Muscle weakness (generalized): Secondary | ICD-10-CM | POA: Diagnosis not present

## 2019-05-19 ENCOUNTER — Telehealth: Payer: Self-pay | Admitting: Family Medicine

## 2019-05-19 NOTE — Telephone Encounter (Signed)
Returned the call to [place] and gave VO.

## 2019-05-19 NOTE — Telephone Encounter (Signed)
Bryan OT with [employer] is calling need verbal orders   2X2, 1x1 for  endurance and balance training

## 2019-05-19 NOTE — Telephone Encounter (Signed)
See below

## 2019-06-27 ENCOUNTER — Other Ambulatory Visit: Payer: Self-pay | Admitting: Family Medicine

## 2019-06-28 DIAGNOSIS — C61 Malignant neoplasm of prostate: Secondary | ICD-10-CM | POA: Diagnosis not present

## 2019-07-03 ENCOUNTER — Other Ambulatory Visit: Payer: Self-pay | Admitting: Family Medicine

## 2019-07-05 DIAGNOSIS — C61 Malignant neoplasm of prostate: Secondary | ICD-10-CM | POA: Diagnosis not present

## 2019-07-05 DIAGNOSIS — R338 Other retention of urine: Secondary | ICD-10-CM | POA: Diagnosis not present

## 2019-08-13 ENCOUNTER — Other Ambulatory Visit: Payer: Self-pay | Admitting: Family Medicine

## 2019-09-07 ENCOUNTER — Ambulatory Visit: Payer: PPO

## 2019-09-15 ENCOUNTER — Other Ambulatory Visit: Payer: Self-pay

## 2019-09-15 ENCOUNTER — Encounter: Payer: Self-pay | Admitting: Family Medicine

## 2019-09-15 ENCOUNTER — Ambulatory Visit (INDEPENDENT_AMBULATORY_CARE_PROVIDER_SITE_OTHER): Payer: PPO | Admitting: Family Medicine

## 2019-09-15 VITALS — BP 100/62 | HR 69 | Temp 98.7°F | Ht 70.0 in | Wt 153.4 lb

## 2019-09-15 DIAGNOSIS — R5383 Other fatigue: Secondary | ICD-10-CM | POA: Diagnosis not present

## 2019-09-15 DIAGNOSIS — C183 Malignant neoplasm of hepatic flexure: Secondary | ICD-10-CM

## 2019-09-15 DIAGNOSIS — E1122 Type 2 diabetes mellitus with diabetic chronic kidney disease: Secondary | ICD-10-CM

## 2019-09-15 DIAGNOSIS — I480 Paroxysmal atrial fibrillation: Secondary | ICD-10-CM | POA: Diagnosis not present

## 2019-09-15 DIAGNOSIS — F325 Major depressive disorder, single episode, in full remission: Secondary | ICD-10-CM

## 2019-09-15 DIAGNOSIS — I700 Atherosclerosis of aorta: Secondary | ICD-10-CM

## 2019-09-15 LAB — COMPREHENSIVE METABOLIC PANEL
ALT: 16 U/L (ref 0–53)
AST: 19 U/L (ref 0–37)
Albumin: 3.6 g/dL (ref 3.5–5.2)
Alkaline Phosphatase: 104 U/L (ref 39–117)
BUN: 22 mg/dL (ref 6–23)
CO2: 31 mEq/L (ref 19–32)
Calcium: 9.5 mg/dL (ref 8.4–10.5)
Chloride: 102 mEq/L (ref 96–112)
Creatinine, Ser: 1.44 mg/dL (ref 0.40–1.50)
GFR: 46.76 mL/min — ABNORMAL LOW (ref >60.00)
Glucose, Bld: 99 mg/dL (ref 70–99)
Potassium: 4.2 mEq/L (ref 3.5–5.1)
Sodium: 141 mEq/L (ref 135–145)
Total Bilirubin: 0.4 mg/dL (ref 0.2–1.2)
Total Protein: 7.3 g/dL (ref 6.0–8.3)

## 2019-09-15 LAB — CBC WITH DIFFERENTIAL/PLATELET
Basophils Absolute: 0.1 10*3/uL (ref 0.0–0.1)
Basophils Relative: 0.8 % (ref 0.0–3.0)
Eosinophils Absolute: 0.3 10*3/uL (ref 0.0–0.7)
Eosinophils Relative: 3.1 % (ref 0.0–5.0)
HCT: 36.8 % — ABNORMAL LOW (ref 39.0–52.0)
Hemoglobin: 12.1 g/dL — ABNORMAL LOW (ref 13.0–17.0)
Lymphocytes Relative: 27.1 % (ref 12.0–46.0)
Lymphs Abs: 2.2 10*3/uL (ref 0.7–4.0)
MCHC: 32.7 g/dL (ref 30.0–36.0)
MCV: 82 fl (ref 78.0–100.0)
Monocytes Absolute: 0.5 10*3/uL (ref 0.1–1.0)
Monocytes Relative: 6 % (ref 3.0–12.0)
Neutro Abs: 5.2 10*3/uL (ref 1.4–7.7)
Neutrophils Relative %: 63 % (ref 43.0–77.0)
Platelets: 399 10*3/uL (ref 150.0–400.0)
RBC: 4.49 Mil/uL (ref 4.22–5.81)
RDW: 18.1 % — ABNORMAL HIGH (ref 11.5–15.5)
WBC: 8.3 10*3/uL (ref 4.0–10.5)

## 2019-09-15 LAB — TSH: TSH: 2.49 u[IU]/mL (ref 0.35–4.50)

## 2019-09-15 LAB — LDL CHOLESTEROL, DIRECT: Direct LDL: 83 mg/dL

## 2019-09-15 LAB — HEMOGLOBIN A1C: Hgb A1c MFr Bld: 7 % — ABNORMAL HIGH (ref 4.6–6.5)

## 2019-09-15 NOTE — Patient Instructions (Addendum)
Health Maintenance Due  Topic Date Due  . OPHTHALMOLOGY EXAM has not had in last year.  04/07/2019   With lightheadedness with standing please stop the hydrochlorothiazide 12.5mg  since your blood pressure looks so good. Lets check back in with a visit in about a month.    Please stop by lab before you go If you do not have mychart- we will call you about results within 5 business days of [place] receiving them.  If you have mychart- we will send your results within 3 business days of [place] receiving them.  If abnormal or we want to clarify a result, we will call or mychart you to make sure you receive the message.  If you have questions or concerns or don't hear within 5 business days, please send [place] a message or call us.

## 2019-09-15 NOTE — Progress Notes (Signed)
Phone [phone] In person visit   Subjective:   Calvin Burnett is a 86 y.o. year old very pleasant male patient who presents for/with See problem oriented charting Chief Complaint  Patient presents with  . Follow-up  . Hypertension  . Diabetes    This visit occurred during the SARS-CoV-2 public health emergency.  Safety protocols were in place, including screening questions prior to the visit, additional usage of staff PPE, and extensive cleaning of exam room while observing appropriate contact time as indicated for disinfecting solutions.   Past Medical History-  Patient Active Problem List   Diagnosis Date Noted  . Intra-abdominal abscess ([place]) 04/24/2019    Priority: High  . B12 deficiency 12/04/2017    Priority: High  . pT1pN0 colon cancer s/p robotic right colectomy 11/26/2017 10/02/2017    Priority: High  . AF (paroxysmal atrial fibrillation) ([place]) 04/09/2017    Priority: High  . Night sweats 09/16/2016    Priority: High  . Major depressive disorder with single episode, in full remission ([place]) 07/29/2016    Priority: High  . Rectal pain 10/14/2014    Priority: High  . Prostate cancer ([place]) 08/12/2012    Priority: High  . Type 2 diabetes mellitus ([place]) 07/31/2010    Priority: High  . Vascular dementia ([place]) 07/27/2008    Priority: High  . History of adenomatous polyp of colon 10/27/2014    Priority: Medium  . Syncope 09/27/2014    Priority: Medium  . CKD (chronic kidney disease), stage III ([place]) 06/01/2014    Priority: Medium  . Hyperlipidemia associated with type 2 diabetes mellitus ([place]) 03/01/2014    Priority: Medium  . Basal cell carcinoma 05/03/2009    Priority: Medium  . Essential hypertension 01/30/2007    Priority: Medium  . Aortic atherosclerosis ([place]) 01/29/2019    Priority: Low  . Stricture of sigmoid s/p robotic sigmoidectomy 11/26/2017 11/26/2017    Priority: Low  . Chronic anticoagulation 11/26/2017    Priority: Low  . Right inguinal  hernia 10/02/2017    Priority: Low  . Bowel habit changes 10/14/2014    Priority: Low  . Bradycardia 09/27/2014    Priority: Low  . LEG CRAMPS, NOCTURNAL 04/30/2010    Priority: Low  . Cervical spondylosis without myelopathy 12/11/2007    Priority: Low  . GERD (gastroesophageal reflux disease) 11/05/2007    Priority: Low    Medications- reviewed and updated Current Outpatient Medications  Medication Sig Dispense Refill  . acetaminophen (TYLENOL) 500 MG tablet Take 500 mg by mouth daily as needed for moderate pain or headache.    Marland Kitchen amLODipine (NORVASC) 5 MG tablet TAKE 1 TABLET BY MOUTH EVERY DAY 30 tablet 5  . ELIQUIS 5 MG TABS tablet TAKE 1 TABLET BY MOUTH TWICE A DAY 60 tablet 11  . latanoprost (XALATAN) 0.005 % ophthalmic solution Place 1 drop into both eyes at bedtime.   12  . memantine (NAMENDA XR) 28 MG CP24 24 hr capsule TAKE 1 CAPSULE (28 MG TOTAL) BY MOUTH DAILY. 90 capsule 3  . metFORMIN (GLUCOPHAGE-XR) 500 MG 24 hr tablet TAKE 1 TABLET BY MOUTH EVERY DAY WITH BREAKFAST 30 tablet 5  . metoprolol succinate (TOPROL-XL) 25 MG 24 hr tablet TAKE 1/2 TABLET BY MOUTH EVERY DAY 45 tablet 1   No current facility-administered medications for this visit.     Objective:  BP 100/62   Pulse 69   Temp 98.7 F (37.1 C) (Temporal)   Ht 5\' 10"  (1.778 m)  Wt 153 lb 6.4 oz (69.6 kg)   SpO2 99%   BMI 22.01 kg/m  Gen: NAD, resting comfortably CV: RRR no murmurs rubs or gallops Lungs: CTAB no crackles, wheeze, rhonchi Abdomen: soft/nontender/nondistended/normal bowel sounds. No rebound or guarding. Surgical scar healing well Ext: no edema Skin: warm, dry  Diabetic Foot Exam - Simple   Simple Foot Form Diabetic Foot exam was performed with the following findings: Yes 09/15/2019 10:54 AM  Visual Inspection No deformities, no ulcerations, no other skin breakdown bilaterally: Yes Sensation Testing See comments: Yes Pulse Check Posterior Tibialis and Dorsalis pulse intact  bilaterally: Yes Comments Limited sensation on right heel only         Assessment and Plan  # Type 2 diabetes mellitus S:currently on Metformin xr 500mg  daily.  He is walking most days where he can walk inside somewhere- near a k+w- otherwise walks outside if weather ok Lab Results  Component Value Date   HGBA1C 6.7 (H) 04/14/2019   HGBA1C 6.7 (H) 12/31/2018   HGBA1C 8.1 (H) 06/22/2018  A/P: hopefully controlled update a1c today   # Hypertension  S:Currently on Norvasc 5mg , HCTZ 12.5mg  and Toprol xl 25mg  (1/2 tab daily)  Not checking blood pressures at home. Patient denies any chest pain, shortness of breath, changes or changes in vision. Does not add salt to food. Tries to maintain a heart healthy diet.   He is having some orthostatic hypotension BP Readings from Last 3 Encounters:  09/15/19 100/62  05/17/19 110/80  05/11/19 125/79   A/P: overcontrolled. From avs "With lightheadedness with standing please stop the hydrochlorothiazide 12.5mg  since your blood pressure looks so good. Lets check back in with a visit in about a month. "  -has some fatigue and I think may be related to blood pressure but we will also check a tsh   #Atrial fibrillation S:Rate controlled on metoprolol.  Anticoagulated with Eliquis. A/P: Paroxysmal atrial fibrillation may have been because of prior stroke.  Appropriately anticoagulated and rate controlled-continue current medication  #hyperlipidemia/Aortic atherosclerosis ([place]), Chronic S: Not currently on statin but LDL has been under 70 Lab Results  Component Value Date   CHOL 128 12/31/2018   HDL 38.90 (L) 12/31/2018   LDLCALC 60 12/31/2018   TRIG 55 05/03/2019   CHOLHDL 3 12/31/2018   A/P: hopefully remains controlled without medicine- if LDL gets above 70 consider once weekly cholesterol medicine due to memory changes  #Vascular dementia S: Patient remains on Namenda 28 mg daily. He denies recent worsening A/P: stable- continue current  medicine   #history of Prostate cancer-following closely with Dr. Diona Fanti- PSAs have remained low with last visit in December 2020   #Intra-abdominal abscess history -patient with extended hospitalization in October 2020.  He has continued to heal well   Major depressive disorder with single episode, in full remission ([place]), Chronic- remains in full remission without medicine. 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 Fuller Plan 02/10/2019 stated no further colonoscopy  Recommended follow up: Return in about 6 months (around 03/17/2020) for physical or sooner if needed.  Lab/Order associations:   ICD-10-CM   1. Type 2 diabetes mellitus with chronic kidney disease, without long-term current use of insulin, unspecified CKD stage ([name])  E11.22 CBC with Differential/Platelet    Comprehensive metabolic panel    Hemoglobin A1c    LDL cholesterol, direct  2. Major depressive disorder with single episode, in full remission ([place]) Chronic F32.5   3. Primary cancer of hepatic flexure of colon ([name]) Chronic C18.3   4. Aortic atherosclerosis ([name]) Chronic I70.0   5. AF (paroxysmal atrial fibrillation) ([name]) Chronic I48.0   6. Fatigue, unspecified type  R53.83 TSH   Return precautions advised.  [name], MD

## 2019-09-16 ENCOUNTER — Other Ambulatory Visit: Payer: Self-pay

## 2019-09-16 MED ORDER — ROSUVASTATIN CALCIUM 20 MG PO TABS: 20.0000 mg | ORAL_TABLET | ORAL | 3 refills | Status: DC

## 2019-10-17 ENCOUNTER — Emergency Department (HOSPITAL_COMMUNITY): Payer: PPO

## 2019-10-17 ENCOUNTER — Encounter (HOSPITAL_COMMUNITY): Payer: Self-pay | Admitting: Emergency Medicine

## 2019-10-17 ENCOUNTER — Other Ambulatory Visit: Payer: Self-pay

## 2019-10-17 ENCOUNTER — Emergency Department (HOSPITAL_COMMUNITY)
Admission: EM | Admit: 2019-10-17 | Discharge: 2019-10-17 | Disposition: A | Discharge status: Home / Self Care | Payer: PPO | Attending: Emergency Medicine | Admitting: Emergency Medicine

## 2019-10-17 DIAGNOSIS — Z7984 Long term (current) use of oral hypoglycemic drugs: Secondary | ICD-10-CM | POA: Diagnosis not present

## 2019-10-17 DIAGNOSIS — Z7901 Long term (current) use of anticoagulants: Secondary | ICD-10-CM | POA: Diagnosis not present

## 2019-10-17 DIAGNOSIS — N183 Chronic kidney disease, stage 3 unspecified: Secondary | ICD-10-CM | POA: Diagnosis not present

## 2019-10-17 DIAGNOSIS — Z8546 Personal history of malignant neoplasm of prostate: Secondary | ICD-10-CM | POA: Diagnosis not present

## 2019-10-17 DIAGNOSIS — I129 Hypertensive chronic kidney disease with stage 1 through stage 4 chronic kidney disease, or unspecified chronic kidney disease: Secondary | ICD-10-CM | POA: Diagnosis not present

## 2019-10-17 DIAGNOSIS — N281 Cyst of kidney, acquired: Secondary | ICD-10-CM | POA: Diagnosis not present

## 2019-10-17 DIAGNOSIS — I4891 Unspecified atrial fibrillation: Secondary | ICD-10-CM | POA: Insufficient documentation

## 2019-10-17 DIAGNOSIS — E1122 Type 2 diabetes mellitus with diabetic chronic kidney disease: Secondary | ICD-10-CM | POA: Diagnosis not present

## 2019-10-17 DIAGNOSIS — Z87891 Personal history of nicotine dependence: Secondary | ICD-10-CM | POA: Diagnosis not present

## 2019-10-17 DIAGNOSIS — Z79899 Other long term (current) drug therapy: Secondary | ICD-10-CM | POA: Diagnosis not present

## 2019-10-17 DIAGNOSIS — R319 Hematuria, unspecified: Secondary | ICD-10-CM | POA: Insufficient documentation

## 2019-10-17 LAB — BASIC METABOLIC PANEL
Anion gap: 12 (ref 5–15)
BUN: 23 mg/dL (ref 8–23)
CO2: 20 mmol/L — ABNORMAL LOW (ref 22–32)
Calcium: 8.6 mg/dL — ABNORMAL LOW (ref 8.9–10.3)
Chloride: 106 mmol/L (ref 98–111)
Creatinine, Ser: 1.34 mg/dL — ABNORMAL HIGH (ref 0.61–1.24)
GFR calc Af Amer: 56 mL/min — ABNORMAL LOW (ref >60)
GFR calc non Af Amer: 49 mL/min — ABNORMAL LOW (ref >60)
Glucose, Bld: 122 mg/dL — ABNORMAL HIGH (ref 70–99)
Potassium: 5 mmol/L (ref 3.5–5.1)
Sodium: 138 mmol/L (ref 135–145)

## 2019-10-17 LAB — URINALYSIS, ROUTINE W REFLEX MICROSCOPIC

## 2019-10-17 LAB — CBC
HCT: 34.6 % — ABNORMAL LOW (ref 39.0–52.0)
Hemoglobin: 10.9 g/dL — ABNORMAL LOW (ref 13.0–17.0)
MCH: 27.7 pg (ref 26.0–34.0)
MCHC: 31.5 g/dL (ref 30.0–36.0)
MCV: 87.8 fL (ref 80.0–100.0)
Platelets: 257 10*3/uL (ref 150–400)
RBC: 3.94 MIL/uL — ABNORMAL LOW (ref 4.22–5.81)
RDW: 19.2 % — ABNORMAL HIGH (ref 11.5–15.5)
WBC: 6.8 10*3/uL (ref 4.0–10.5)
nRBC: 0 % (ref 0.0–0.2)

## 2019-10-17 LAB — URINALYSIS, MICROSCOPIC (REFLEX)
RBC / HPF: >50 RBC/hpf (ref 0–5)
Squamous Epithelial / HPF: NONE SEEN (ref 0–5)

## 2019-10-17 LAB — PROTIME-INR
INR: 1.4 — ABNORMAL HIGH (ref 0.8–1.2)
Prothrombin Time: 17.3 s — ABNORMAL HIGH (ref 11.4–15.2)

## 2019-10-17 MED ORDER — SODIUM CHLORIDE 0.9 % IV SOLN
2.0000 g | Freq: Once | INTRAVENOUS | Status: AC
  Administered 2019-10-17: 2 g via INTRAVENOUS
  Filled 2019-10-17: qty 20

## 2019-10-17 MED ORDER — CEPHALEXIN 500 MG PO CAPS: 500.0000 mg | ORAL_CAPSULE | Freq: Four times a day (QID) | ORAL | 0 refills | Status: DC
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
## 2019-10-17 NOTE — Discharge Instructions (Signed)
Stop Eliquis until reseen by urologist Take Keflex as prescribed Call Dr. Diona Fanti Monday for recheck

## 2019-10-17 NOTE — ED Triage Notes (Signed)
Reports hematuria since this morning.  Denies pain.

## 2019-10-17 NOTE — ED Notes (Signed)
Patient verbalizes understanding of discharge instructions. Opportunity for questioning and answers were provided. Armband removed by staff, pt discharged from ED ambulatory.   

## 2019-10-18 ENCOUNTER — Telehealth: Payer: Self-pay | Admitting: Family Medicine

## 2019-10-18 DIAGNOSIS — C61 Malignant neoplasm of prostate: Secondary | ICD-10-CM | POA: Diagnosis not present

## 2019-10-18 DIAGNOSIS — R310 Gross hematuria: Secondary | ICD-10-CM | POA: Diagnosis not present

## 2019-10-18 LAB — URINE CULTURE: Culture: NO GROWTH

## 2019-10-18 NOTE — Telephone Encounter (Signed)
Since this was held due to [facility] call Dr. Dahlstedt/[facility] and see if it is okay for [place] to restart Eliquis at this time

## 2019-10-18 NOTE — Telephone Encounter (Signed)
Calvin Burnett was following up with patient in regard to ED visit over the weekend.  States patient was advised to hold his eliquis 5mg .  Patient was not advised as to when to start this medication back.  Patient has a follow up visit with Dr. [name] scheduled on 4/15.  States patient seen his urologist today.  There was no discussion in regard to eliquis.  Is requesting Dr. [name] team to give patient and/or her a call in regards to when or if he needs to start the eliquis back, given history of Afib.

## 2019-10-18 NOTE — Telephone Encounter (Signed)
Please advise when to start back

## 2019-10-19 NOTE — Telephone Encounter (Signed)
Received call back from [place] requesting that we call patient to give information. I have reached out to patient and informed. Will call if any issues or questions.

## 2019-10-19 NOTE — Telephone Encounter (Signed)
Called back gave information to [place]. They will let us know if any questions.

## 2019-10-26 ENCOUNTER — Encounter (HOSPITAL_COMMUNITY): Payer: Self-pay | Admitting: Emergency Medicine

## 2019-10-26 ENCOUNTER — Other Ambulatory Visit: Payer: Self-pay

## 2019-10-26 ENCOUNTER — Emergency Department (HOSPITAL_COMMUNITY)
Admission: EM | Admit: 2019-10-26 | Discharge: 2019-10-27 | Disposition: A | Discharge status: Home / Self Care | Payer: PPO | Attending: Emergency Medicine | Admitting: Emergency Medicine

## 2019-10-26 DIAGNOSIS — Z8546 Personal history of malignant neoplasm of prostate: Secondary | ICD-10-CM | POA: Diagnosis not present

## 2019-10-26 DIAGNOSIS — Z79899 Other long term (current) drug therapy: Secondary | ICD-10-CM | POA: Insufficient documentation

## 2019-10-26 DIAGNOSIS — I10 Essential (primary) hypertension: Secondary | ICD-10-CM | POA: Insufficient documentation

## 2019-10-26 DIAGNOSIS — Z7901 Long term (current) use of anticoagulants: Secondary | ICD-10-CM | POA: Insufficient documentation

## 2019-10-26 DIAGNOSIS — I4891 Unspecified atrial fibrillation: Secondary | ICD-10-CM | POA: Diagnosis not present

## 2019-10-26 DIAGNOSIS — Z85828 Personal history of other malignant neoplasm of skin: Secondary | ICD-10-CM | POA: Insufficient documentation

## 2019-10-26 DIAGNOSIS — M7989 Other specified soft tissue disorders: Secondary | ICD-10-CM | POA: Diagnosis not present

## 2019-10-26 DIAGNOSIS — R600 Localized edema: Secondary | ICD-10-CM | POA: Insufficient documentation

## 2019-10-26 DIAGNOSIS — Z87891 Personal history of nicotine dependence: Secondary | ICD-10-CM | POA: Insufficient documentation

## 2019-10-26 DIAGNOSIS — Z7984 Long term (current) use of oral hypoglycemic drugs: Secondary | ICD-10-CM | POA: Insufficient documentation

## 2019-10-26 DIAGNOSIS — E119 Type 2 diabetes mellitus without complications: Secondary | ICD-10-CM | POA: Diagnosis not present

## 2019-10-26 DIAGNOSIS — R609 Edema, unspecified: Secondary | ICD-10-CM

## 2019-10-26 LAB — CBC WITH DIFFERENTIAL/PLATELET
Abs Immature Granulocytes: 0.04 10*3/uL (ref 0.00–0.07)
Basophils Absolute: 0.1 10*3/uL (ref 0.0–0.1)
Basophils Relative: 1 %
Eosinophils Absolute: 0.5 10*3/uL (ref 0.0–0.5)
Eosinophils Relative: 7 %
HCT: 34.5 % — ABNORMAL LOW (ref 39.0–52.0)
Hemoglobin: 10.9 g/dL — ABNORMAL LOW (ref 13.0–17.0)
Immature Granulocytes: 1 %
Lymphocytes Relative: 27 %
Lymphs Abs: 1.8 10*3/uL (ref 0.7–4.0)
MCH: 27.5 pg (ref 26.0–34.0)
MCHC: 31.6 g/dL (ref 30.0–36.0)
MCV: 86.9 fL (ref 80.0–100.0)
Monocytes Absolute: 0.5 10*3/uL (ref 0.1–1.0)
Monocytes Relative: 8 %
Neutro Abs: 3.9 10*3/uL (ref 1.7–7.7)
Neutrophils Relative %: 56 %
Platelets: 186 10*3/uL (ref 150–400)
RBC: 3.97 MIL/uL — ABNORMAL LOW (ref 4.22–5.81)
RDW: 19.3 % — ABNORMAL HIGH (ref 11.5–15.5)
WBC: 6.8 10*3/uL (ref 4.0–10.5)
nRBC: 0 % (ref 0.0–0.2)

## 2019-10-26 LAB — COMPREHENSIVE METABOLIC PANEL
ALT: 21 U/L (ref 0–44)
AST: 27 U/L (ref 15–41)
Albumin: 3.3 g/dL — ABNORMAL LOW (ref 3.5–5.0)
Alkaline Phosphatase: 107 U/L (ref 38–126)
Anion gap: 10 (ref 5–15)
BUN: 21 mg/dL (ref 8–23)
CO2: 23 mmol/L (ref 22–32)
Calcium: 8.7 mg/dL — ABNORMAL LOW (ref 8.9–10.3)
Chloride: 102 mmol/L (ref 98–111)
Creatinine, Ser: 1.48 mg/dL — ABNORMAL HIGH (ref 0.61–1.24)
GFR calc Af Amer: 50 mL/min — ABNORMAL LOW (ref >60)
GFR calc non Af Amer: 43 mL/min — ABNORMAL LOW (ref >60)
Glucose, Bld: 160 mg/dL — ABNORMAL HIGH (ref 70–99)
Potassium: 3.8 mmol/L (ref 3.5–5.1)
Sodium: 135 mmol/L (ref 135–145)
Total Bilirubin: 0.8 mg/dL (ref 0.3–1.2)
Total Protein: 7.2 g/dL (ref 6.5–8.1)

## 2019-10-26 NOTE — ED Triage Notes (Signed)
Patient reports increasing edema at bilateral lower legs onset this week more at right side , denies SOB , no cough or fever .

## 2019-10-27 ENCOUNTER — Telehealth: Payer: Self-pay | Admitting: Family Medicine

## 2019-10-27 ENCOUNTER — Ambulatory Visit (HOSPITAL_BASED_OUTPATIENT_CLINIC_OR_DEPARTMENT_OTHER)
Admission: RE | Admit: 2019-10-27 | Discharge: 2019-10-27 | Disposition: A | Discharge status: Home / Self Care | Payer: PPO | Source: Ambulatory Visit | Attending: Emergency Medicine | Admitting: Emergency Medicine

## 2019-10-27 DIAGNOSIS — M7989 Other specified soft tissue disorders: Secondary | ICD-10-CM

## 2019-10-27 NOTE — Progress Notes (Signed)
Lower extremity venous has been completed.   Preliminary results in CV Proc.   Abram Sander 10/27/2019 8:56 AM

## 2019-10-27 NOTE — Discharge Instructions (Addendum)
I recommend wearing compression stockings and decreasing your salt intake to help with your leg swelling.  We have ordered ultrasound of both of your legs to look for blood clots that will be done this morning.  Please return to the emergency department if you begin having chest pain or shortness of breath.  I recommend close follow-up with your primary care doctor.

## 2019-10-27 NOTE — Telephone Encounter (Signed)
Chief Complaint Leg Swelling And Edema Reason for Call Symptomatic / Request for Health Information Initial Comment Caller states she is calling on behalf of her father. He has swelling in his legs and feet. It goes midway up his leg. He has an appointment on Thursday. He has put his legs up and that helped a little bit. He states he can hardly get his shoes on. Translation No Nurse Assessment Nurse: Toribio Harbour, RN, Joelene Millin Date/Time (Eastern Time): 10/26/2019 7:36:17 PM Confirm and document reason for call. If symptomatic, describe symptoms. ---Caller states father is having swelling in his legs and feet. She is in a different city. He has an appt on Thursday. He is unable to get his shoes on. Offered to call patient directly and caller agrees. Has the patient had close contact with a person known or suspected to have the novel coronavirus illness OR traveled / lives in area with major community spread (including international travel) in the last 14 days from the onset of symptoms? * If Asymptomatic, screen for exposure and travel within the last 14 days. ---No Does the patient have any new or worsening symptoms? ---Yes Will a triage be completed? ---Yes Related visit to physician within the last 2 weeks? ---No Does the PT have any chronic conditions? (i.e. diabetes, asthma, this includes High risk factors for pregnancy, etc.) ---Yes List chronic conditions. ---Diabetes Is this a behavioral health or substance abuse call? ---No Guidelines Guideline Title Affirmed Question Affirmed Notes Nurse Date/Time Eilene Ghazi Time) Leg Swelling and Edema [1] Thigh, calf, or ankle swelling AND [2] Toribio Harbour, RNJoelene Millin 10/26/2019 7:44:24 PMPLEASE NOTE: All timestamps contained within this report are represented as [place] Standard Time. CONFIDENTIALTY NOTICE: This fax transmission is intended only for the addressee. It contains information that is legally privileged, confidential or otherwise protected  from use or disclosure. If you are not the intended recipient, you are strictly prohibited from reviewing, disclosing, copying using or disseminating any of this information or taking any action in reliance on or regarding this information. If you have received this fax in error, please notify us immediately by telephone so that we can arrange for its return to [place]. Phone: [phone], Toll-Free: [phone], Fax: [phone] Page: 2 of 2 Call Id: [id number] Guidelines Guideline Title Affirmed Question Affirmed Notes Nurse Date/Time Eilene Ghazi Time) bilateral AND [3] 1 side is more swollen Disp. Time Eilene Ghazi Time) Disposition Final User 10/26/2019 7:48:52 PM See HCP within 4 Hours (or PCP triage) Yes Toribio Harbour, RN, Renea Ee Disagree/Comply Disagree Caller Understands Yes PreDisposition InappropriateToAsk Care Advice Given Per Guideline SEE HCP WITHIN 4 HOURS (OR PCP TRIAGE): * IF OFFICE WILL BE OPEN: You need to be seen within the next 3 or 4 hours. Call your doctor (or NP/PA) now or as soon as the office opens. CALL EMS IF: * Chest pain or shortness of breath occurs. CARE ADVICE given per Leg Swelling and Edema (Adult) guideline. Comments User: Devoria Albe, RN Date/Time Eilene Ghazi Time): 10/26/2019 7:51:20 PM Caller states he doesn't think there is much difference between the swelling of his legs and he will wait and call the doctor tomorrow. He states he will go in if it gets worse. Referrals [facility]
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
## 2019-10-27 NOTE — Telephone Encounter (Signed)
Noted, appointment Thursday.

## 2019-10-28 ENCOUNTER — Ambulatory Visit (INDEPENDENT_AMBULATORY_CARE_PROVIDER_SITE_OTHER): Payer: PPO | Admitting: Family Medicine

## 2019-10-28 ENCOUNTER — Encounter: Payer: Self-pay | Admitting: Family Medicine

## 2019-10-28 ENCOUNTER — Other Ambulatory Visit: Payer: Self-pay

## 2019-10-28 VITALS — BP 122/80 | HR 98 | Temp 97.7°F | Ht 70.0 in | Wt 164.4 lb

## 2019-10-28 DIAGNOSIS — N183 Chronic kidney disease, stage 3 unspecified: Secondary | ICD-10-CM | POA: Diagnosis not present

## 2019-10-28 DIAGNOSIS — E1122 Type 2 diabetes mellitus with diabetic chronic kidney disease: Secondary | ICD-10-CM | POA: Diagnosis not present

## 2019-10-28 DIAGNOSIS — F0150 Vascular dementia without behavioral disturbance: Secondary | ICD-10-CM | POA: Diagnosis not present

## 2019-10-28 DIAGNOSIS — I10 Essential (primary) hypertension: Secondary | ICD-10-CM

## 2019-10-28 DIAGNOSIS — M109 Gout, unspecified: Secondary | ICD-10-CM | POA: Insufficient documentation

## 2019-10-28 DIAGNOSIS — I480 Paroxysmal atrial fibrillation: Secondary | ICD-10-CM | POA: Diagnosis not present

## 2019-10-28 DIAGNOSIS — R609 Edema, unspecified: Secondary | ICD-10-CM

## 2019-10-28 LAB — POC URINALSYSI DIPSTICK (AUTOMATED)
Bilirubin, UA: POSITIVE
Blood, UA: POSITIVE
Glucose, UA: NEGATIVE
Ketones, UA: NEGATIVE
Leukocytes, UA: NEGATIVE
Nitrite, UA: NEGATIVE
Protein, UA: POSITIVE — AB
Spec Grav, UA: >=1.03 — AB (ref 1.010–1.025)
Urobilinogen, UA: 0.2 E.U./dL
pH, UA: 6 (ref 5.0–8.0)

## 2019-10-28 MED ORDER — AMLODIPINE BESYLATE 2.5 MG PO TABS: 2.5000 mg | ORAL_TABLET | Freq: Every day | ORAL | 3 refills | Status: DC

## 2019-10-28 MED ORDER — HYDROCHLOROTHIAZIDE 25 MG PO TABS: 25.0000 mg | ORAL_TABLET | Freq: Every day | ORAL | 3 refills | Status: DC

## 2019-10-28 NOTE — Progress Notes (Signed)
Notes from Dr. Algie Coffer were dehydrated-especially with [place] adding hydrochlorothiazide I want you to make sure to remain well-hydrated.There was some protein and blood in the urine-team I believe I sent a CCed chart on this but I want to make sure patient went back and had cystoscopy with urology as planned. No obvious infection on urine

## 2019-10-28 NOTE — Patient Instructions (Addendum)
Health Maintenance Due  Topic Date Due  . OPHTHALMOLOGY EXAM  Patient declined at this time. Ideally we would get this updated with your diabetes history 04/07/2019   1.  Make sure to use compression stockings regularly. Prop your legs up when you are at home but ok to get out and walk 2.  Try to reduce high salt foods.  Eating at home is generally healthy other than eating out. 3.  Reduce amlodipine from 5 mg to 2.5 mg.  I sent in a new prescription for you of 2.5 mg but you can start with cutting your current pill in half. 4.  Increase hydrochlorothiazide to 25 mg-this is a diuretic blood pressure medicine.  5.  Lets follow-up sometime between 2 and 4 weeks for recheck to see how you are doing  Check with the front desk about how to get signed up for proxy access for his MyChart account-he would have to create a MyChart account to get started   Please stop by lab before you go If you do not have mychart- we will call you about results within 5 business days of [place] receiving them.  If you have mychart- we will send your results within 3 business days of [place] receiving them.  If abnormal or we want to clarify a result, we will call or mychart you to make sure you receive the message.  If you have questions or concerns or don't hear within 5 business days, please send [place] a message or call us.     Recommended follow up: Return in about 3 weeks (around 11/18/2019) for follow up- or sooner if needed.

## 2019-10-28 NOTE — Progress Notes (Signed)
Phone [phone] In person visit   Subjective:   Calvin Burnett is a 86 y.o. year old very pleasant male patient who presents for/with See problem oriented charting Chief Complaint  Patient presents with   Follow-up   Diabetes    This visit occurred during the SARS-CoV-2 public health emergency.  Safety protocols were in place, including screening questions prior to the visit, additional usage of staff PPE, and extensive cleaning of exam room while observing appropriate contact time as indicated for disinfecting solutions.   Past Medical History-  Patient Active Problem List   Diagnosis Date Noted   Intra-abdominal abscess ([place]) 04/24/2019    Priority: High   B12 deficiency 12/04/2017    Priority: High   pT1pN0 colon cancer s/p robotic right colectomy 11/26/2017 10/02/2017    Priority: High   AF (paroxysmal atrial fibrillation) ([place]) 04/09/2017    Priority: High   Night sweats 09/16/2016    Priority: High   Major depressive disorder with single episode, in full remission ([place]) 07/29/2016    Priority: High   Rectal pain 10/14/2014    Priority: High   Prostate cancer ([place]) 08/12/2012    Priority: High   Type 2 diabetes mellitus ([place]) 07/31/2010    Priority: High   Vascular dementia ([place]) 07/27/2008    Priority: High   History of adenomatous polyp of colon 10/27/2014    Priority: Medium   Syncope 09/27/2014    Priority: Medium   CKD (chronic kidney disease), stage III ([place]) 06/01/2014    Priority: Medium   Hyperlipidemia associated with type 2 diabetes mellitus ([place]) 03/01/2014    Priority: Medium   Basal cell carcinoma 05/03/2009    Priority: Medium   Essential hypertension 01/30/2007    Priority: Medium   Aortic atherosclerosis ([place]) 01/29/2019    Priority: Low   Stricture of sigmoid s/p robotic sigmoidectomy 11/26/2017 11/26/2017    Priority: Low   Chronic anticoagulation 11/26/2017    Priority: Low   Right inguinal hernia 10/02/2017   Priority: Low   Bowel habit changes 10/14/2014    Priority: Low   Bradycardia 09/27/2014    Priority: Low   LEG CRAMPS, NOCTURNAL 04/30/2010    Priority: Low   Cervical spondylosis without myelopathy 12/11/2007    Priority: Low   GERD (gastroesophageal reflux disease) 11/05/2007    Priority: Low   Gout 10/28/2019    Medications- reviewed and updated Current Outpatient Medications  Medication Sig Dispense Refill   acetaminophen (TYLENOL) 500 MG tablet Take 500 mg by mouth every 8 (eight) hours as needed for mild pain or headache.      amLODipine (NORVASC) 2.5 MG tablet Take 1 tablet (2.5 mg total) by mouth daily. 90 tablet 3   cephALEXin (KEFLEX) 500 MG capsule Take 1 capsule (500 mg total) by mouth 4 (four) times daily. 20 capsule 0   ELIQUIS 5 MG TABS tablet TAKE 1 TABLET BY MOUTH TWICE A DAY (Patient taking differently: Take 5 mg by mouth 2 (two) times daily. ) 60 tablet 11   hydrochlorothiazide (HYDRODIURIL) 25 MG tablet Take 1 tablet (25 mg total) by mouth daily. 90 tablet 3   latanoprost (XALATAN) 0.005 % ophthalmic solution Place 1 drop into both eyes at bedtime.   12   memantine (NAMENDA XR) 28 MG CP24 24 hr capsule TAKE 1 CAPSULE (28 MG TOTAL) BY MOUTH DAILY. 90 capsule 3   metFORMIN (GLUCOPHAGE-XR) 500 MG 24 hr tablet TAKE 1 TABLET BY MOUTH EVERY DAY WITH BREAKFAST (Patient taking  differently: Take 500 mg by mouth daily with breakfast. ) 30 tablet 5   metoprolol succinate (TOPROL-XL) 25 MG 24 hr tablet TAKE 1/2 TABLET BY MOUTH EVERY DAY (Patient taking differently: Take 12.5 mg by mouth daily. ) 45 tablet 1   rosuvastatin (CRESTOR) 20 MG tablet Take 1 tablet (20 mg total) by mouth once a week. 13 tablet 3   No current facility-administered medications for this visit.     Objective:  BP 122/80    Pulse 98    Temp 97.7 F (36.5 C) (Temporal)    Ht 5\' 10"  (1.778 m)    Wt 164 lb 6.4 oz (74.6 kg)    SpO2 98%    BMI 23.59 kg/m  Gen: NAD, resting comfortably CV:  RRR no murmurs rubs or gallops Lungs: CTAB no crackles, wheeze, rhonchi Abdomen: soft/nontender/nondistended/normal bowel sounds.  Ext: 1+ edema Skin: warm, dry    Assessment and Plan   # gout- History of gout when he was much younger- a few episodes. Daughter would like to check uric acid to see if contributing to kidney issues.  She had a family friend strongly suggest this .   # Edema  S:B/L swelling on both legs from Knee down starting easter. Right greater than left.  Started about 1 week. Was seen in ED on 10/26/2019. Denies any SOB or chest pain. He does have improvement wit elevation. Had negative U/s to rule out DVT on 10/27/2019.   Work-up largely reassuring so far-Slightly low albumin.  Patient is on amlodipine which could cause edema. Does eat out a fair amount which could contribute to edema  Recommended compression stockings in the past but patient is not using at present A/P: I suspect edema is multifactorial-chronic kidney disease stage III now, venous insufficiency not using compression stockings, amlodipine usage. -Start regular compression stockings -Reduce amlodipine from 5 to 2.5 mg and increase HCTZ from 12.5 to 25 mg.  I will plan to monitor renal function at follow-up-has had some worsening of renal function in the past and wondered if hydrochlorothiazide could contribute but with edema and how patient is distressed about this I think it is reasonable to try -We will check blood work including BNP-if BNP is elevated likely get echocardiogram  #Gross hematuria S:Patient was seen at ED on 10/17/2019. Has been followed by urology and has no other symptoms of this.   This was an episode of gross hematuria.  CT was obtained which did not show kidney stones or other cause of hematuria.  They held Eliquis and recommended he follow-up with Dr. Ronn Melena saw Dr. Diona Fanti and no obvious cause was found.  No further work-up planned at this time per daughter. Urine culture  negative A/P: Has begun gross hematuria work-up.  After visit with patient I reviewed note from Dr. Diona Fanti on October 18, 2019-appears there was some concern for infection (only microscopic hematuria at that time) and plan was to treat with antibiotics and have patient return for cystoscopy-I am going to have my team reach out to patient and patient's daughter to make sure they have follow-up scheduled.  There was some concern this could be radiation related in addition to the Eliquis  From message to [facility] team "I was reviewing notes after visit from urology.  On October 18, 2019 Dr. Diona Fanti wanted patient to take antibiotic and follow-up in 2 weeks for cystoscopy which is where they look into the bladder.  We did not specifically discuss today whether patient had cystoscopy-I just  want to make sure this happened.  If it did not happen I want to make sure he follows up with urology."   #Vascular dementia S: Patient is compliant with memantine/Namenda.  Patient has been on memantine for many years.  In the past patient has declined neurology work-up as of 2020-we need to check again in 2021 A/P: Largely stable-thankfully daughter is able to help patient and is supportive in with him today.  We will continue current medication  #Atrial fibrillation S: Patient is anticoagulated with Eliquis 5 mg twice daily as well as rate controlled with metoprolol 25 mg extended release A/P: Appropriately rate controlled and anticoagulated-continue current medication  # Diabetes S: Compliant withMetformin 500 mg daily with breakfast  A/P: Hemoglobin A1c under 2 months reasonably controlled-continue current medication   #hyperlipidemia/history of stroke-may have been atrial fibrillation related S: compliant with rosuvastatin 20 mg A/P: Reasonably controlled with LDL under 70 on last check-continue current medications   #hypertension/CKD stage III S: compliant with  metoprolol 12.5 mg extended release.   Amlodipine 5 mg, hydrochlorothiazide 12.5 mg   CKD stage III with creatinine ranging from 1.2-1.5 for the most part.  Night sweats in the past on ramipril and urine microalbumin to creatinine ratio has not been elevated so we have not restarted ACE inhibitor A/P: Hypertension well-controlled but with significant edema-decreased amlodipine from 5 to 2.5 mg and increase hydrochlorothiazide from 12.5 to 25 mg due to edema issues  Hoping CKD stage III is stable-update CMP today  Recommended follow up: Return in about 3 weeks (around 11/18/2019) for follow up- or sooner if needed.  Close follow-up to check on edema and blood pressure Future Appointments  Date Time Provider [place]  11/18/2019  2:40 PM Marin Olp, MD [facility]    Lab/Order associations:   ICD-10-CM   1. Type 2 diabetes mellitus with chronic kidney disease, without long-term current use of insulin, unspecified CKD stage ([name])  E11.22   2. Gout, unspecified cause, unspecified chronicity, unspecified site  M10.9   3. Edema, unspecified type  R60.9 Uric acid    POCT Urinalysis Dipstick (Automated)    TSH    B Nat Peptide    CBC with Differential/Platelet    Comprehensive metabolic panel  4. Essential hypertension  I10 TSH    CBC with Differential/Platelet    Comprehensive metabolic panel    Meds ordered this encounter  Medications   amLODipine (NORVASC) 2.5 MG tablet    Sig: Take 1 tablet (2.5 mg total) by mouth daily.    Dispense:  90 tablet    Refill:  3   hydrochlorothiazide (HYDRODIURIL) 25 MG tablet    Sig: Take 1 tablet (25 mg total) by mouth daily.    Dispense:  90 tablet    Refill:  3   Return precautions advised.  [name], MD

## 2019-10-28 NOTE — Assessment & Plan Note (Signed)
S: compliant with  metoprolol 12.5 mg extended release.  Amlodipine 5 mg, hydrochlorothiazide 12.5 mg   CKD stage III with creatinine ranging from 1.2-1.5 for the most part.  Night sweats in the past on ramipril and urine microalbumin to creatinine ratio has not been elevated so we have not restarted ACE inhibitor A/P: Hypertension well-controlled but with significant edema-decreased amlodipine from 5 to 2.5 mg and increase hydrochlorothiazide from 12.5 to 25 mg due to edema issues  Hoping CKD stage III is stable-update CMP today

## 2019-10-29 LAB — CBC WITH DIFFERENTIAL/PLATELET
Basophils Absolute: 0.1 10*3/uL (ref 0.0–0.1)
Basophils Relative: 1.2 % (ref 0.0–3.0)
Eosinophils Absolute: 0.5 10*3/uL (ref 0.0–0.7)
Eosinophils Relative: 6.1 % — ABNORMAL HIGH (ref 0.0–5.0)
HCT: 36.7 % — ABNORMAL LOW (ref 39.0–52.0)
Hemoglobin: 12.1 g/dL — ABNORMAL LOW (ref 13.0–17.0)
Lymphocytes Relative: 26.7 % (ref 12.0–46.0)
Lymphs Abs: 2.3 10*3/uL (ref 0.7–4.0)
MCHC: 33 g/dL (ref 30.0–36.0)
MCV: 85.3 fl (ref 78.0–100.0)
Monocytes Absolute: 0.6 10*3/uL (ref 0.1–1.0)
Monocytes Relative: 7.2 % (ref 3.0–12.0)
Neutro Abs: 5 10*3/uL (ref 1.4–7.7)
Neutrophils Relative %: 58.8 % (ref 43.0–77.0)
Platelets: 210 10*3/uL (ref 150.0–400.0)
RBC: 4.31 Mil/uL (ref 4.22–5.81)
RDW: 20.5 % — ABNORMAL HIGH (ref 11.5–15.5)
WBC: 8.5 10*3/uL (ref 4.0–10.5)

## 2019-10-29 LAB — URIC ACID: Uric Acid, Serum: 5.7 mg/dL (ref 4.0–7.8)

## 2019-10-29 LAB — COMPREHENSIVE METABOLIC PANEL
ALT: 17 U/L (ref 0–53)
AST: 23 U/L (ref 0–37)
Albumin: 4 g/dL (ref 3.5–5.2)
Alkaline Phosphatase: 108 U/L (ref 39–117)
BUN: 25 mg/dL — ABNORMAL HIGH (ref 6–23)
CO2: 27 mEq/L (ref 19–32)
Calcium: 9.1 mg/dL (ref 8.4–10.5)
Chloride: 104 mEq/L (ref 96–112)
Creatinine, Ser: 1.47 mg/dL (ref 0.40–1.50)
GFR: 45.65 mL/min — ABNORMAL LOW (ref >60.00)
Glucose, Bld: 125 mg/dL — ABNORMAL HIGH (ref 70–99)
Potassium: 4.2 mEq/L (ref 3.5–5.1)
Sodium: 139 mEq/L (ref 135–145)
Total Bilirubin: 0.7 mg/dL (ref 0.2–1.2)
Total Protein: 7.7 g/dL (ref 6.0–8.3)

## 2019-10-29 LAB — TSH: TSH: 3.83 u[IU]/mL (ref 0.35–4.50)

## 2019-10-29 NOTE — Addendum Note (Signed)
Addended by: Francis Dowse T on: 10/29/2019 10:07 AM   Modules accepted: Orders

## 2019-10-29 NOTE — Addendum Note (Signed)
Addended by: Francis Dowse T on: 10/29/2019 03:01 PM   Modules accepted: Orders

## 2019-11-01 ENCOUNTER — Telehealth: Payer: Self-pay | Admitting: Family Medicine

## 2019-11-01 NOTE — Telephone Encounter (Signed)
Please advise 

## 2019-11-01 NOTE — Telephone Encounter (Signed)
You can hold/stop amlodipine.  Have him check blood pressure daily and make a list and schedule a visit for a week for follow-up since we are coming off the medicine completely

## 2019-11-01 NOTE — Telephone Encounter (Signed)
Patient daughter states that he does not want to stop meds now. Gave message if he does he will give [place] a call for f/u app .

## 2019-11-01 NOTE — Telephone Encounter (Signed)
Chief Complaint Leg Swelling And Edema Reason for Call Symptomatic / Request for Health Information Initial Comment Caller's father is on a blood pressure medication called Amoldpine . Caller states he was on 5 mg and it was cut to 2.5 mg. Caller states his legs and feet are swelling. Caller wants to know if he can be taken off the medication. Translation No Nurse Assessment Nurse: White, RN, Sharen Hint Date/Time (Eastern Time): 10/29/2019 7:41:21 PM Confirm and document reason for call. If symptomatic, describe symptoms. ---Caller's father is on a blood pressure medication called Amlodipine . Caller states he was on 5 mg and it was cut to 2.5 mg. Caller states his legs and feet are swelling. Caller wants to know if he can be taken off the medication. Swelling began yesterday when dosage was changed. No SOB or CP. Has the patient had close contact with a person known or suspected to have the novel coronavirus illness OR traveled / lives in area with major community spread (including international travel) in the last 14 days from the onset of symptoms? * If Asymptomatic, screen for exposure and travel within the last 14 days. ---No Does the patient have any new or worsening symptoms? ---Yes Will a triage be completed? ---Yes Related visit to physician within the last 2 weeks? ---Yes Does the PT have any chronic conditions? (i.e. diabetes, asthma, this includes High risk factors for pregnancy, etc.) ---Yes List chronic conditions. ---HTN, On Eliquis, DM II Is this a behavioral health or substance abuse call? ---NoPLEASE NOTE: All timestamps contained within this report are represented as [place] Standard Time. CONFIDENTIALTY NOTICE: This fax transmission is intended only for the addressee. It contains information that is legally privileged, confidential or otherwise protected from use or disclosure. If you are not the intended recipient, you are strictly prohibited from reviewing,  disclosing, copying using or disseminating any of this information or taking any action in reliance on or regarding this information. If you have received this fax in error, please notify us immediately by telephone so that we can arrange for its return to [place]. Phone: [phone], Toll-Free: [phone], Fax: [phone] Page: 2 of 2 Call Id: [id number] Guidelines Guideline Title Affirmed Question Affirmed Notes Nurse Date/Time Eilene Ghazi Time) Leg Swelling and Edema [1] MODERATE leg swelling (e.g., swelling extends up to knees) AND [2] new onset or worsening Meredeth Ide 10/29/2019 7:44:16 PM Disp. Time Eilene Ghazi Time) Disposition Final User 10/29/2019 7:52:44 PM See PCP within 24 Hours Yes Dema Severin, RN, Sharen Hint Caller Disagree/Comply Comply Caller Understands Yes PreDisposition Did not know what to do Care Advice Given Per Guideline SEE PCP WITHIN 24 HOURS: * IF OFFICE WILL BE OPEN: You need to be seen within the next 24 hours. Call your doctor (or NP/PA) when the office opens and make an appointment. LEG SWELLING OR EDEMA: * Elevate your legs or try to lay down once or twice daily for 20 minutes. * Avoid socks with an elastic band at the top. Wear comfortable shoes. CALL BACK IF: * Breathing difficulty or chest pain occurs * You become worse. CARE ADVICE given per Leg Swelling and Edema (Adult) guideline. Referrals REFERRED TO PCP OFFIC

## 2019-11-02 ENCOUNTER — Telehealth: Payer: Self-pay

## 2019-11-02 ENCOUNTER — Other Ambulatory Visit: Payer: PPO

## 2019-11-02 ENCOUNTER — Other Ambulatory Visit: Payer: Self-pay

## 2019-11-02 LAB — BRAIN NATRIURETIC PEPTIDE: Pro B Natriuretic peptide (BNP): 173 pg/mL — ABNORMAL HIGH (ref 0.0–100.0)

## 2019-11-02 NOTE — Telephone Encounter (Signed)
Sorry to clarify come off amlodipine completely-remove this from his medication list.  Please remind him that he is still on hydrochlorothiazide 25 mg for blood pressure.  He has a visit on May 6

## 2019-11-02 NOTE — Telephone Encounter (Signed)
Lets have him come off of medication and see what his blood pressure does.  If he is regularly getting readings over 140/90 I can send something else in

## 2019-11-08 NOTE — Telephone Encounter (Signed)
LM tcb.

## 2019-11-16 ENCOUNTER — Telehealth: Payer: Self-pay | Admitting: Family Medicine

## 2019-11-16 NOTE — Telephone Encounter (Signed)
I left a message asking the patient to call and schedule Medicare AWV with Loma Sousa ([place]) on 11/18/2019 after seeing [name].  I'm waiting for a call back to either confirm or decline the appointment. If patient calls back, please update appointment notes. Last AWV 03/11/2019 (HTA calendar year) VDM ([place])

## 2019-11-18 ENCOUNTER — Ambulatory Visit (INDEPENDENT_AMBULATORY_CARE_PROVIDER_SITE_OTHER): Payer: PPO | Admitting: Family Medicine

## 2019-11-18 ENCOUNTER — Other Ambulatory Visit: Payer: Self-pay

## 2019-11-18 ENCOUNTER — Ambulatory Visit: Payer: PPO

## 2019-11-18 ENCOUNTER — Encounter: Payer: Self-pay | Admitting: Family Medicine

## 2019-11-18 VITALS — BP 108/60 | HR 82 | Temp 98.6°F | Ht 70.0 in | Wt 154.0 lb

## 2019-11-18 VITALS — BP 108/60 | HR 82 | Temp 98.6°F | Ht 70.0 in | Wt 154.1 lb

## 2019-11-18 DIAGNOSIS — I10 Essential (primary) hypertension: Secondary | ICD-10-CM | POA: Diagnosis not present

## 2019-11-18 DIAGNOSIS — I480 Paroxysmal atrial fibrillation: Secondary | ICD-10-CM

## 2019-11-18 DIAGNOSIS — R600 Localized edema: Secondary | ICD-10-CM

## 2019-11-18 DIAGNOSIS — N183 Chronic kidney disease, stage 3 unspecified: Secondary | ICD-10-CM | POA: Diagnosis not present

## 2019-11-18 DIAGNOSIS — E1122 Type 2 diabetes mellitus with diabetic chronic kidney disease: Secondary | ICD-10-CM | POA: Diagnosis not present

## 2019-11-18 DIAGNOSIS — Z0000 Encounter for general adult medical examination without abnormal findings: Secondary | ICD-10-CM

## 2019-11-18 MED ORDER — MEMANTINE HCL ER 28 MG PO CP24: 28.0000 mg | ORAL_CAPSULE | Freq: Every day | ORAL | 3 refills | Status: DC
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
## 2019-11-18 NOTE — Patient Instructions (Signed)
Calvin Burnett , Thank you for taking time to come for your Medicare Wellness Visit. I appreciate your ongoing commitment to your health goals. Please review the following plan we discussed and let me know if I can assist you in the future.   Screening recommendations/referrals: Colorectal Screening: up to date; last colonoscopy 02/10/19  Vision and Dental Exams: Recommended annual ophthalmology exams for early detection of glaucoma and other disorders of the eye Recommended annual dental exams for proper oral hygiene  Diabetic Exams: Diabetic Eye Exam: recommended yearly Diabetic Foot Exam: recommended yearly; up to date   Vaccinations: Influenza vaccine: completed 04/19/19 Pneumococcal vaccine: up to date; last 05/30/15 Tdap vaccine: up to date; last 01/28/19  Shingles vaccine: Shingrix completed Covid vaccine: Completed   Advanced directives: Please bring a copy of your POA (Power of [place]) and/or Living Will to your next appointment.  Goals: Recommend to drink at least 6-8 8oz glasses of water per day and consume a balanced diet rich in fresh fruits and vegetables.   Next appointment: Please schedule your Annual Wellness Visit with your Nurse Health Advisor in one year.  Preventive Care 43 Years and Older, Male Preventive care refers to lifestyle choices and visits with your health care provider that can promote health and wellness. What does preventive care include?  A yearly physical exam. This is also called an annual well check.  Dental exams once or twice a year.  Routine eye exams. Ask your health care provider how often you should have your eyes checked.  Personal lifestyle choices, including:  Daily care of your teeth and gums.  Regular physical activity.  Eating a healthy diet.  Avoiding tobacco and drug use.  Limiting alcohol use.  Practicing safe sex.  Taking low doses of aspirin every day if recommended by your health care provider..  Taking vitamin  and mineral supplements as recommended by your health care provider. What happens during an annual well check? The services and screenings done by your health care provider during your annual well check will depend on your age, overall health, lifestyle risk factors, and family history of disease. Counseling  Your health care provider may ask you questions about your:  Alcohol use.  Tobacco use.  Drug use.  Emotional well-being.  Home and relationship well-being.  Sexual activity.  Eating habits.  History of falls.  Memory and ability to understand (cognition).  Work and work Statistician. Screening  You may have the following tests or measurements:  Height, weight, and BMI.  Blood pressure.  Lipid and cholesterol levels. These may be checked every 5 years, or more frequently if you are over 61 years old.  Skin check.  Lung cancer screening. You may have this screening every year starting at age 15 if you have a 30-pack-year history of smoking and currently smoke or have quit within the past 15 years.  Fecal occult blood test (FOBT) of the stool. You may have this test every year starting at age 56.  Flexible sigmoidoscopy or colonoscopy. You may have a sigmoidoscopy every 5 years or a colonoscopy every 10 years starting at age 81.  Prostate cancer screening. Recommendations will vary depending on your family history and other risks.  Hepatitis C blood test.  Hepatitis B blood test.  Sexually transmitted disease (STD) testing.  Diabetes screening. This is done by checking your blood sugar (glucose) after you have not eaten for a while (fasting). You may have this done every 1-3 years.  Abdominal aortic aneurysm (AAA)  screening. You may need this if you are a current or former smoker.  Osteoporosis. You may be screened starting at age 54 if you are at high risk. Talk with your health care provider about your test results, treatment options, and if necessary, the  need for more tests. Vaccines  Your health care provider may recommend certain vaccines, such as:  Influenza vaccine. This is recommended every year.  Tetanus, diphtheria, and acellular pertussis (Tdap, Td) vaccine. You may need a Td booster every 10 years.  Zoster vaccine. You may need this after age 28.  Pneumococcal 13-valent conjugate (PCV13) vaccine. One dose is recommended after age 59.  Pneumococcal polysaccharide (PPSV23) vaccine. One dose is recommended after age 82. Talk to your health care provider about which screenings and vaccines you need and how often you need them. This information is not intended to replace advice given to you by your health care provider. Make sure you discuss any questions you have with your health care provider. Document Released: 07/28/2015 Document Revised: 03/20/2016 Document Reviewed: 05/02/2015 Elsevier Interactive Patient Education  2017 [place] Prevention in the Home Falls can cause injuries. They can happen to people of all ages. There are many things you can do to make your home safe and to help prevent falls. What can I do on the outside of my home?  Regularly fix the edges of walkways and driveways and fix any cracks.  Remove anything that might make you trip as you walk through a door, such as a raised step or threshold.  Trim any bushes or trees on the path to your home.  Use bright outdoor lighting.  Clear any walking paths of anything that might make someone trip, such as rocks or tools.  Regularly check to see if handrails are loose or broken. Make sure that both sides of any steps have handrails.  Any raised decks and porches should have guardrails on the edges.  Have any leaves, snow, or ice cleared regularly.  Use sand or salt on walking paths during winter.  Clean up any spills in your garage right away. This includes oil or grease spills. What can I do in the bathroom?  Use night lights.  Install grab  bars by the toilet and in the tub and shower. Do not use towel bars as grab bars.  Use non-skid mats or decals in the tub or shower.  If you need to sit down in the shower, use a plastic, non-slip stool.  Keep the floor dry. Clean up any water that spills on the floor as soon as it happens.  Remove soap buildup in the tub or shower regularly.  Attach bath mats securely with double-sided non-slip rug tape.  Do not have throw rugs and other things on the floor that can make you trip. What can I do in the bedroom?  Use night lights.  Make sure that you have a light by your bed that is easy to reach.  Do not use any sheets or blankets that are too big for your bed. They should not hang down onto the floor.  Have a firm chair that has side arms. You can use this for support while you get dressed.  Do not have throw rugs and other things on the floor that can make you trip. What can I do in the kitchen?  Clean up any spills right away.  Avoid walking on wet floors.  Keep items that you use a lot in easy-to-reach  places.  If you need to reach something above you, use a strong step stool that has a grab bar.  Keep electrical cords out of the way.  Do not use floor polish or wax that makes floors slippery. If you must use wax, use non-skid floor wax.  Do not have throw rugs and other things on the floor that can make you trip. What can I do with my stairs?  Do not leave any items on the stairs.  Make sure that there are handrails on both sides of the stairs and use them. Fix handrails that are broken or loose. Make sure that handrails are as long as the stairways.  Check any carpeting to make sure that it is firmly attached to the stairs. Fix any carpet that is loose or worn.  Avoid having throw rugs at the top or bottom of the stairs. If you do have throw rugs, attach them to the floor with carpet tape.  Make sure that you have a light switch at the top of the stairs and the  bottom of the stairs. If you do not have them, ask someone to add them for you. What else can I do to help prevent falls?  Wear shoes that:  Do not have high heels.  Have rubber bottoms.  Are comfortable and fit you well.  Are closed at the toe. Do not wear sandals.  If you use a stepladder:  Make sure that it is fully opened. Do not climb a closed stepladder.  Make sure that both sides of the stepladder are locked into place.  Ask someone to hold it for you, if possible.  Clearly mark and make sure that you can see:  Any grab bars or handrails.  First and last steps.  Where the edge of each step is.  Use tools that help you move around (mobility aids) if they are needed. These include:  Canes.  Walkers.  Scooters.  Crutches.  Turn on the lights when you go into a dark area. Replace any light bulbs as soon as they burn out.  Set up your furniture so you have a clear path. Avoid moving your furniture around.  If any of your floors are uneven, fix them.  If there are any pets around you, be aware of where they are.  Review your medicines with your doctor. Some medicines can make you feel dizzy. This can increase your chance of falling. Ask your doctor what other things that you can do to help prevent falls. This information is not intended to replace advice given to you by your health care provider. Make sure you discuss any questions you have with your health care provider. Document Released: 04/27/2009 Document Revised: 12/07/2015 Document Reviewed: 08/05/2014 Elsevier Interactive Patient Education  2017 [employer].

## 2019-11-18 NOTE — Patient Instructions (Addendum)
Health Maintenance Due  Topic Date Due  . OPHTHALMOLOGY EXAM Call for appointment  04/07/2019  . URINE MICROALBUMIN will order today  12/31/2019    Stay on all your blood pressure medications. If you have any changes in blood pressure let our office know.   If you start having changes in memory let our office know. We can talk about setting you up with Neurology.   Make sure you keep your appointment with urology.     We will call you after lab work is reviewed to let you know if we need to change any medications.

## 2019-11-18 NOTE — Progress Notes (Signed)
Phone [phone] In person visit   Subjective:   TWAIN POLLAN is a 86 y.o. year old very pleasant male patient who presents for/with See problem oriented charting Chief Complaint  Patient presents with  . Hypertension  . Diabetes   This visit occurred during the SARS-CoV-2 public health emergency.  Safety protocols were in place, including screening questions prior to the visit, additional usage of staff PPE, and extensive cleaning of exam room while observing appropriate contact time as indicated for disinfecting solutions.   Past Medical History-  Patient Active Problem List   Diagnosis Date Noted  . Intra-abdominal abscess ([place]) 04/24/2019    Priority: High  . B12 deficiency 12/04/2017    Priority: High  . pT1pN0 colon cancer s/p robotic right colectomy 11/26/2017 10/02/2017    Priority: High  . AF (paroxysmal atrial fibrillation) ([place]) 04/09/2017    Priority: High  . Night sweats 09/16/2016    Priority: High  . Major depressive disorder with single episode, in full remission ([place]) 07/29/2016    Priority: High  . Rectal pain 10/14/2014    Priority: High  . Prostate cancer ([place]) 08/12/2012    Priority: High  . Type 2 diabetes mellitus ([place]) 07/31/2010    Priority: High  . Vascular dementia ([place]) 07/27/2008    Priority: High  . History of adenomatous polyp of colon 10/27/2014    Priority: Medium  . Syncope 09/27/2014    Priority: Medium  . CKD (chronic kidney disease), stage III ([place]) 06/01/2014    Priority: Medium  . Hyperlipidemia associated with type 2 diabetes mellitus ([place]) 03/01/2014    Priority: Medium  . Basal cell carcinoma 05/03/2009    Priority: Medium  . Essential hypertension 01/30/2007    Priority: Medium  . Aortic atherosclerosis ([place]) 01/29/2019    Priority: Low  . Stricture of sigmoid s/p robotic sigmoidectomy 11/26/2017 11/26/2017    Priority: Low  . Chronic anticoagulation 11/26/2017    Priority: Low  . Right inguinal hernia 10/02/2017    Priority: Low  . Bowel habit changes 10/14/2014    Priority: Low  . Bradycardia 09/27/2014    Priority: Low  . LEG CRAMPS, NOCTURNAL 04/30/2010    Priority: Low  . Cervical spondylosis without myelopathy 12/11/2007    Priority: Low  . GERD (gastroesophageal reflux disease) 11/05/2007    Priority: Low  . Gout 10/28/2019    Medications- reviewed and updated Current Outpatient Medications  Medication Sig Dispense Refill  . acetaminophen (TYLENOL) 500 MG tablet Take 500 mg by mouth every 8 (eight) hours as needed for mild pain or headache.     Marland Kitchen amLODipine (NORVASC) 2.5 MG tablet Take 1 tablet (2.5 mg total) by mouth daily. 90 tablet 3  . cephALEXin (KEFLEX) 500 MG capsule Take 1 capsule (500 mg total) by mouth 4 (four) times daily. 20 capsule 0  . ELIQUIS 5 MG TABS tablet TAKE 1 TABLET BY MOUTH TWICE A DAY (Patient taking differently: Take 5 mg by mouth 2 (two) times daily. ) 60 tablet 11  . hydrochlorothiazide (HYDRODIURIL) 25 MG tablet Take 1 tablet (25 mg total) by mouth daily. 90 tablet 3  . latanoprost (XALATAN) 0.005 % ophthalmic solution Place 1 drop into both eyes at bedtime.   12  . memantine (NAMENDA XR) 28 MG CP24 24 hr capsule TAKE 1 CAPSULE (28 MG TOTAL) BY MOUTH DAILY. 90 capsule 3  . metFORMIN (GLUCOPHAGE-XR) 500 MG 24 hr tablet TAKE 1 TABLET BY MOUTH EVERY DAY WITH BREAKFAST (Patient taking  differently: Take 500 mg by mouth daily with breakfast. ) 30 tablet 5  . metoprolol succinate (TOPROL-XL) 25 MG 24 hr tablet TAKE 1/2 TABLET BY MOUTH EVERY DAY (Patient taking differently: Take 12.5 mg by mouth daily. ) 45 tablet 1  . rosuvastatin (CRESTOR) 20 MG tablet Take 1 tablet (20 mg total) by mouth once a week. 13 tablet 3   No current facility-administered medications for this visit.     Objective:  BP 108/60   Pulse 82   Temp 98.6 F (37 C) (Temporal)   Ht 5\' 10"  (1.778 m)   Wt 154 lb (69.9 kg)   SpO2 97%   BMI 22.10 kg/m  Gen: NAD, resting comfortably CV:  Irregularly irregular no murmurs rubs or gallops Lungs: CTAB no crackles, wheeze, rhonchi Abdomen: soft/nontender/nondistended/normal bowel sounds. No rebound or guarding.  Ext: Trace edema improved from 1+ edema Skin: warm, dry Neuro: grossly normal, moves all extremities.  Does look the daughter for some answers     Assessment and Plan   #Vascular dementia S: Patient is compliant with memantine/Namenda.  Patient has been on memantine for many years.  In the past patient has declined neurology work-up as of 2020-declines again today  Lives alone still. Daughter helping with appointments now though.  A/P: Patient seems to be doing reasonably well living independently.  He is getting support from family for doctor appointments which I think is great. -We are going to update a wellness visit this afternoon to evaluate functional status  #Atrial fibrillation S: Patient is anticoagulated with Eliquis 5 mg twice daily as well as rate controlled with metoprolol 25 mg extended release A/P: Patient is appropriately anticoagulated and rate controlled.  Appears to be in atrial fibrillation today.  We discussed we may have to reduce Eliquis if creatinine gets above 1.5 consistently-I may also adjust hydrochlorothiazide down   #hypertension/CKD stage III S: compliant with  metoprolol 12.5 mg extended release.  On October 28, 2019 decreased amlodipine from 5 to 2.5 mg and increase hydrochlorothiazide from 12.5 to 25 mg due to edema issues BP Readings from Last 3 Encounters:  11/18/19 108/60  11/18/19 108/60  10/28/19 122/80   In regards to CKD stage III-creatinine ranging from 1.2-1.5 for the most part.  Night sweats in the past on ramipril and urine microalbumin to creatinine ratio has not been elevated so we have not restarted ACE inhibitor A/P: Excellent control of blood pressure-patient denies lightheadedness/dizziness.  I consider decreasing hydrochlorothiazide today but frequent medication  changes can be difficult on patient and he wanted to hold off on making any changes unless renal function had worsened-if it has worsened I would likely go down to hydrochlorothiazide 12.5 mg daily and continue amlodipine 2.5 mg and metoprolol  #Diabetes-needs updated urine microalbumin test  Recommended follow up:  3 month physical or sooner if needed Future Appointments  Date Time Provider [place]  03/10/2020  2:40 PM Marin Olp, MD [facility]   Lab/Order associations:   ICD-10-CM   1. Type 2 diabetes mellitus with chronic kidney disease, without long-term current use of insulin, unspecified CKD stage ([name])  E11.22 Microalbumin / creatinine urine ratio  2. Stage 3 chronic kidney disease, unspecified whether stage 3a or 3b CKD  [id number] Basic metabolic panel  3. Essential hypertension  I10   4. AF (paroxysmal atrial fibrillation) ([name])  I48.0     Meds ordered this encounter  Medications  . memantine (NAMENDA XR) 28 MG CP24 24 hr capsule  Sig: Take 1 capsule (28 mg total) by mouth daily.    Dispense:  90 capsule    Refill:  3   Return precautions advised.  [name], MD

## 2019-11-19 ENCOUNTER — Other Ambulatory Visit: Payer: Self-pay | Admitting: Family Medicine

## 2019-11-19 ENCOUNTER — Encounter: Payer: Self-pay | Admitting: Family Medicine

## 2019-11-19 LAB — MICROALBUMIN / CREATININE URINE RATIO
Creatinine,U: 180.4 mg/dL
Microalb Creat Ratio: 5.5 mg/g (ref 0.0–30.0)
Microalb, Ur: 10 mg/dL — ABNORMAL HIGH (ref 0.0–1.9)

## 2019-11-19 LAB — BASIC METABOLIC PANEL
BUN: 29 mg/dL — ABNORMAL HIGH (ref 6–23)
CO2: 30 mEq/L (ref 19–32)
Calcium: 9.1 mg/dL (ref 8.4–10.5)
Chloride: 101 mEq/L (ref 96–112)
Creatinine, Ser: 1.59 mg/dL — ABNORMAL HIGH (ref 0.40–1.50)
GFR: 41.69 mL/min — ABNORMAL LOW (ref >60.00)
Glucose, Bld: 128 mg/dL — ABNORMAL HIGH (ref 70–99)
Potassium: 4 mEq/L (ref 3.5–5.1)
Sodium: 138 mEq/L (ref 135–145)

## 2019-11-19 MED ORDER — HYDROCHLOROTHIAZIDE 12.5 MG PO TABS: 12.5000 mg | ORAL_TABLET | Freq: Every day | ORAL | 3 refills | Status: DC

## 2019-12-14 ENCOUNTER — Emergency Department (HOSPITAL_COMMUNITY): Payer: PPO

## 2019-12-14 ENCOUNTER — Other Ambulatory Visit: Payer: Self-pay

## 2019-12-14 ENCOUNTER — Telehealth: Payer: Self-pay | Admitting: Family Medicine

## 2019-12-14 ENCOUNTER — Emergency Department (HOSPITAL_COMMUNITY)
Admission: EM | Admit: 2019-12-14 | Discharge: 2019-12-15 | Disposition: A | Discharge status: Home / Self Care | Payer: PPO | Attending: Emergency Medicine | Admitting: Emergency Medicine

## 2019-12-14 DIAGNOSIS — Z7901 Long term (current) use of anticoagulants: Secondary | ICD-10-CM | POA: Insufficient documentation

## 2019-12-14 DIAGNOSIS — Z8546 Personal history of malignant neoplasm of prostate: Secondary | ICD-10-CM | POA: Diagnosis not present

## 2019-12-14 DIAGNOSIS — I10 Essential (primary) hypertension: Secondary | ICD-10-CM | POA: Insufficient documentation

## 2019-12-14 DIAGNOSIS — E119 Type 2 diabetes mellitus without complications: Secondary | ICD-10-CM | POA: Insufficient documentation

## 2019-12-14 DIAGNOSIS — Z79899 Other long term (current) drug therapy: Secondary | ICD-10-CM | POA: Insufficient documentation

## 2019-12-14 DIAGNOSIS — I4891 Unspecified atrial fibrillation: Secondary | ICD-10-CM | POA: Insufficient documentation

## 2019-12-14 DIAGNOSIS — Z7984 Long term (current) use of oral hypoglycemic drugs: Secondary | ICD-10-CM | POA: Diagnosis not present

## 2019-12-14 DIAGNOSIS — R519 Headache, unspecified: Secondary | ICD-10-CM | POA: Diagnosis not present

## 2019-12-14 DIAGNOSIS — R42 Dizziness and giddiness: Secondary | ICD-10-CM

## 2019-12-14 LAB — COMPREHENSIVE METABOLIC PANEL
ALT: 23 U/L (ref 0–44)
AST: 27 U/L (ref 15–41)
Albumin: 3.2 g/dL — ABNORMAL LOW (ref 3.5–5.0)
Alkaline Phosphatase: 111 U/L (ref 38–126)
Anion gap: 10 (ref 5–15)
BUN: 28 mg/dL — ABNORMAL HIGH (ref 8–23)
CO2: 27 mmol/L (ref 22–32)
Calcium: 8.8 mg/dL — ABNORMAL LOW (ref 8.9–10.3)
Chloride: 101 mmol/L (ref 98–111)
Creatinine, Ser: 1.71 mg/dL — ABNORMAL HIGH (ref 0.61–1.24)
GFR calc Af Amer: 42 mL/min — ABNORMAL LOW (ref >60)
GFR calc non Af Amer: 36 mL/min — ABNORMAL LOW (ref >60)
Glucose, Bld: 230 mg/dL — ABNORMAL HIGH (ref 70–99)
Potassium: 3.7 mmol/L (ref 3.5–5.1)
Sodium: 138 mmol/L (ref 135–145)
Total Bilirubin: 0.8 mg/dL (ref 0.3–1.2)
Total Protein: 6.9 g/dL (ref 6.5–8.1)

## 2019-12-14 LAB — PROTIME-INR
INR: 1.3 — ABNORMAL HIGH (ref 0.8–1.2)
Prothrombin Time: 16 seconds — ABNORMAL HIGH (ref 11.4–15.2)

## 2019-12-14 LAB — DIFFERENTIAL
Abs Immature Granulocytes: 0.02 10*3/uL (ref 0.00–0.07)
Basophils Absolute: 0.1 10*3/uL (ref 0.0–0.1)
Basophils Relative: 1 %
Eosinophils Absolute: 0.3 10*3/uL (ref 0.0–0.5)
Eosinophils Relative: 5 %
Immature Granulocytes: 0 %
Lymphocytes Relative: 45 %
Lymphs Abs: 2.4 10*3/uL (ref 0.7–4.0)
Monocytes Absolute: 0.3 10*3/uL (ref 0.1–1.0)
Monocytes Relative: 6 %
Neutro Abs: 2.2 10*3/uL (ref 1.7–7.7)
Neutrophils Relative %: 43 %

## 2019-12-14 LAB — CBC
HCT: 36.5 % — ABNORMAL LOW (ref 39.0–52.0)
Hemoglobin: 11.8 g/dL — ABNORMAL LOW (ref 13.0–17.0)
MCH: 27.8 pg (ref 26.0–34.0)
MCHC: 32.3 g/dL (ref 30.0–36.0)
MCV: 86.1 fL (ref 80.0–100.0)
Platelets: 203 10*3/uL (ref 150–400)
RBC: 4.24 MIL/uL (ref 4.22–5.81)
RDW: 16.5 % — ABNORMAL HIGH (ref 11.5–15.5)
WBC: 5.2 10*3/uL (ref 4.0–10.5)
nRBC: 0 % (ref 0.0–0.2)

## 2019-12-14 LAB — APTT: aPTT: 40 seconds — ABNORMAL HIGH (ref 24–36)

## 2019-12-14 MED ORDER — SODIUM CHLORIDE 0.9% FLUSH
3.0000 mL | Freq: Once | INTRAVENOUS | Status: DC
Start: 2019-12-14 — End: 2019-12-15

## 2019-12-14 NOTE — Telephone Encounter (Signed)
Needs to be seen-I am not sure I have any availability tomorrow-perhaps a colleague could see him if any availability.  New or worsening symptoms he should seek care

## 2019-12-14 NOTE — ED Notes (Signed)
During repeat V/S pt's pule was 40's-50's. On  arrival pulse rate was 97. Triage RN notified and repeat EKG was completed.

## 2019-12-14 NOTE — Telephone Encounter (Signed)
Returned [employer], she is a Therapist, sports that does housecalls with [employer] and she states she went to the pt home today and pt was c/o dizziness and he was holding on to the walls and furniture to move around the house. Vitals were normal and she is concerned that pt may be experiencing vertigo. She states the pt stated the back of his head and his forehead "just dosen't feel right". She states pt denies SOB, chest discomfort, HA, Nausea/vomitting. He states his head just dosen't feel right and she wanted to bring this to your attenttion.

## 2019-12-14 NOTE — ED Triage Notes (Signed)
Pt was seen by a CMA today and left a note about the pt being dizzy and holding onto the walls. Pt states he had a HA earlier today but has resolved. Pt states he does not recall being dizzy earlier.

## 2019-12-14 NOTE — Telephone Encounter (Signed)
Raymond Gurney from [facility] called and wanted to relay how her visit with the patient went. Please call her at [phone].

## 2019-12-14 NOTE — ED Notes (Addendum)
Calvin Burnett, son, was called and is able to sit in lobby with pt due to his dementia. If son isn't able to make it right away please call son to drive him home because pt is unable to see at night to drive.

## 2019-12-15 ENCOUNTER — Emergency Department (HOSPITAL_COMMUNITY): Payer: PPO

## 2019-12-15 ENCOUNTER — Encounter (HOSPITAL_COMMUNITY): Payer: Self-pay | Admitting: *Deleted

## 2019-12-15 DIAGNOSIS — R42 Dizziness and giddiness: Secondary | ICD-10-CM | POA: Diagnosis not present

## 2019-12-15 MED ORDER — MECLIZINE HCL 25 MG PO TABS
25.0000 mg | ORAL_TABLET | Freq: Three times a day (TID) | ORAL | 0 refills | Status: DC | PRN
Start: 2019-12-15 — End: 2020-03-10

## 2019-12-15 MED ORDER — MECLIZINE HCL 25 MG PO TABS
25.0000 mg | ORAL_TABLET | Freq: Once | ORAL | Status: AC
  Administered 2019-12-15: 25 mg via ORAL
  Filled 2019-12-15: qty 1

## 2019-12-15 NOTE — ED Notes (Signed)
Patient verbalizes understanding of discharge instructions . Opportunity for questions and answers were provided . Armband removed by staff ,Pt discharged from ED. W/C  offered at D/C  and Declined W/C at D/C and was escorted to lobby by RN.  

## 2019-12-15 NOTE — ED Provider Notes (Signed)
Patient seen/examined in the Emergency Department in conjunction with Advanced Practice Provider  Patient reports dizziness.  Denies HA at this time Exam : awake/alert, no distress, no arm/leg drift Plan: pt reports dizziness and difficult historian.  Will proceed with MRI Brain    Ripley Fraise, MD 12/15/19 [phone]

## 2019-12-15 NOTE — ED Notes (Signed)
Pt transported to MRI 

## 2019-12-15 NOTE — Discharge Instructions (Signed)
Please drink plenty of fluids Take Meclizine as needed up to three times daily for dizziness Follow up with your doctor Return if worsening
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
## 2019-12-15 NOTE — Telephone Encounter (Signed)
Called and lm on Calvin Burnett's vm that pt needs to be seen in office.

## 2019-12-15 NOTE — Telephone Encounter (Signed)
LVM for patient to call back and schedule appt.  Patient was seen at the ED on 12/14/19

## 2019-12-16 LAB — I-STAT CHEM 8, ED
BUN: 28 mg/dL — ABNORMAL HIGH (ref 8–23)
Calcium, Ion: 1.19 mmol/L (ref 1.15–1.40)
Chloride: 103 mmol/L (ref 98–111)
Creatinine, Ser: 1.7 mg/dL — ABNORMAL HIGH (ref 0.61–1.24)
Glucose, Bld: 230 mg/dL — ABNORMAL HIGH (ref 70–99)
HCT: 36 % — ABNORMAL LOW (ref 39.0–52.0)
Hemoglobin: 12.2 g/dL — ABNORMAL LOW (ref 13.0–17.0)
Potassium: 3.8 mmol/L (ref 3.5–5.1)
Sodium: 141 mmol/L (ref 135–145)
TCO2: 26 mmol/L (ref 22–32)

## 2019-12-17 ENCOUNTER — Other Ambulatory Visit: Payer: Self-pay

## 2019-12-17 ENCOUNTER — Encounter: Payer: Self-pay | Admitting: Family Medicine

## 2019-12-17 ENCOUNTER — Ambulatory Visit (INDEPENDENT_AMBULATORY_CARE_PROVIDER_SITE_OTHER): Payer: PPO | Admitting: Family Medicine

## 2019-12-17 VITALS — BP 108/60 | HR 75 | Temp 98.2°F | Ht 69.0 in | Wt 151.0 lb

## 2019-12-17 DIAGNOSIS — H8111 Benign paroxysmal vertigo, right ear: Secondary | ICD-10-CM | POA: Diagnosis not present

## 2019-12-17 DIAGNOSIS — E785 Hyperlipidemia, unspecified: Secondary | ICD-10-CM

## 2019-12-17 DIAGNOSIS — I480 Paroxysmal atrial fibrillation: Secondary | ICD-10-CM

## 2019-12-17 DIAGNOSIS — H8110 Benign paroxysmal vertigo, unspecified ear: Secondary | ICD-10-CM | POA: Diagnosis not present

## 2019-12-17 DIAGNOSIS — E1169 Type 2 diabetes mellitus with other specified complication: Secondary | ICD-10-CM

## 2019-12-17 DIAGNOSIS — E1122 Type 2 diabetes mellitus with diabetic chronic kidney disease: Secondary | ICD-10-CM

## 2019-12-17 DIAGNOSIS — I10 Essential (primary) hypertension: Secondary | ICD-10-CM | POA: Diagnosis not present

## 2019-12-17 LAB — CBC WITH DIFFERENTIAL/PLATELET
Basophils Absolute: 0.1 10*3/uL (ref 0.0–0.1)
Basophils Relative: 0.8 % (ref 0.0–3.0)
Eosinophils Absolute: 0.3 10*3/uL (ref 0.0–0.7)
Eosinophils Relative: 3.9 % (ref 0.0–5.0)
HCT: 37.8 % — ABNORMAL LOW (ref 39.0–52.0)
Hemoglobin: 12.6 g/dL — ABNORMAL LOW (ref 13.0–17.0)
Lymphocytes Relative: 34.4 % (ref 12.0–46.0)
Lymphs Abs: 2.7 10*3/uL (ref 0.7–4.0)
MCHC: 33.3 g/dL (ref 30.0–36.0)
MCV: 85.4 fl (ref 78.0–100.0)
Monocytes Absolute: 0.6 10*3/uL (ref 0.1–1.0)
Monocytes Relative: 7.6 % (ref 3.0–12.0)
Neutro Abs: 4.1 10*3/uL (ref 1.4–7.7)
Neutrophils Relative %: 53.3 % (ref 43.0–77.0)
Platelets: 199 10*3/uL (ref 150.0–400.0)
RBC: 4.43 Mil/uL (ref 4.22–5.81)
RDW: 17.7 % — ABNORMAL HIGH (ref 11.5–15.5)
WBC: 7.7 10*3/uL (ref 4.0–10.5)

## 2019-12-17 LAB — BASIC METABOLIC PANEL
BUN: 25 mg/dL — ABNORMAL HIGH (ref 6–23)
CO2: 27 mEq/L (ref 19–32)
Calcium: 8.9 mg/dL (ref 8.4–10.5)
Chloride: 102 mEq/L (ref 96–112)
Creatinine, Ser: 1.58 mg/dL — ABNORMAL HIGH (ref 0.40–1.50)
GFR: 41.99 mL/min — ABNORMAL LOW (ref >60.00)
Glucose, Bld: 94 mg/dL (ref 70–99)
Potassium: 4 mEq/L (ref 3.5–5.1)
Sodium: 136 mEq/L (ref 135–145)

## 2019-12-17 LAB — LIPID PANEL
Cholesterol: 105 mg/dL (ref 0–200)
HDL: 32 mg/dL — ABNORMAL LOW (ref >39.00)
LDL Cholesterol: 57 mg/dL (ref 0–99)
NonHDL: 73.28
Total CHOL/HDL Ratio: 3
Triglycerides: 79 mg/dL (ref 0.0–149.0)
VLDL: 15.8 mg/dL (ref 0.0–40.0)

## 2019-12-17 LAB — HEMOGLOBIN A1C: Hgb A1c MFr Bld: 6.6 % — ABNORMAL HIGH (ref 4.6–6.5)

## 2019-12-17 NOTE — Assessment & Plan Note (Signed)
#   Vertigo/dizziness S: Patient was seen in ED on 12/14/2019- actually went in on [facility] but had prolonged wait. Complained of imbalance and dizziness- concerning for vertigo. He and family were worried about stroke as was EDP and both CT and MRI when at ED. These showed remote right superficial temporal infarct stable from 2019 exam.    He has been taking the Meclizine as directed. When he takes it is helpful. He has not had any falls. He describes the sensation as side to side motion with no nausea or vomiting. Mild improvement but did have some recurrent symptoms this AM  A/P: This appears to be benign paroxysmal positional vertigo to me.  Positive Dix-Hallpike maneuver on the right.  Meclizine is reasonable but we discussed can cause confusion and family should monitor closely.  We will refer to vestibular rehab for next week-they were unable to go this afternoon.

## 2019-12-17 NOTE — Patient Instructions (Addendum)
Health Maintenance Due  Topic Date Due  . OPHTHALMOLOGY EXAM  Has app made  04/07/2019    Vertigo -  Continue with the meclizine. Continue to work on Designer, fashion/clothing. We are going to set you up for Physical therapy to help with the Vertigo.   The following is a link to some exercises that you can try once a day to see if it will improve vertigo over the weekend.  Richrd Sox, MD Vertigo Treatment Oct 11 [email or web address]  Blood Pressure:  we are going to have you hold the Hydrochlorothiazide for now. The concern is that your blood pressure is getting to low and causing strain on your kidneys   Please pick up a blood pressure cuff in order to monitor readings at home. We would like for your readings to be between 120/60 and 130/70. If readings are out of this range either low or high let our office know.   Omron series 3 blood pressure cuff is a good option for home use that is reasonably priced. You can get this at any pharmacy or [facility].

## 2019-12-17 NOTE — Progress Notes (Signed)
Phone [phone] In person visit   Subjective:   Calvin Burnett is a 86 y.o. year old very pleasant male patient who presents for/with See problem oriented charting Chief Complaint  Patient presents with  . Follow-up  . ED f/u   This visit occurred during the SARS-CoV-2 public health emergency.  Safety protocols were in place, including screening questions prior to the visit, additional usage of staff PPE, and extensive cleaning of exam room while observing appropriate contact time as indicated for disinfecting solutions.   Past Medical History-  Patient Active Problem List   Diagnosis Date Noted  . Intra-abdominal abscess ([place]) 04/24/2019    Priority: High  . B12 deficiency 12/04/2017    Priority: High  . pT1pN0 colon cancer s/p robotic right colectomy 11/26/2017 10/02/2017    Priority: High  . AF (paroxysmal atrial fibrillation) ([place]) 04/09/2017    Priority: High  . Night sweats 09/16/2016    Priority: High  . Major depressive disorder with single episode, in full remission ([place]) 07/29/2016    Priority: High  . Rectal pain 10/14/2014    Priority: High  . Prostate cancer ([place]) 08/12/2012    Priority: High  . Type 2 diabetes mellitus ([place]) 07/31/2010    Priority: High  . Vascular dementia ([place]) 07/27/2008    Priority: High  . BPPV (benign paroxysmal positional vertigo) 12/17/2019    Priority: Medium  . History of adenomatous polyp of colon 10/27/2014    Priority: Medium  . Syncope 09/27/2014    Priority: Medium  . CKD (chronic kidney disease), stage III ([place]) 06/01/2014    Priority: Medium  . Hyperlipidemia associated with type 2 diabetes mellitus ([place]) 03/01/2014    Priority: Medium  . Basal cell carcinoma 05/03/2009    Priority: Medium  . Essential hypertension 01/30/2007    Priority: Medium  . Aortic atherosclerosis ([place]) 01/29/2019    Priority: Low  . Stricture of sigmoid s/p robotic sigmoidectomy 11/26/2017 11/26/2017    Priority: Low  . Chronic  anticoagulation 11/26/2017    Priority: Low  . Right inguinal hernia 10/02/2017    Priority: Low  . Bowel habit changes 10/14/2014    Priority: Low  . Bradycardia 09/27/2014    Priority: Low  . LEG CRAMPS, NOCTURNAL 04/30/2010    Priority: Low  . Cervical spondylosis without myelopathy 12/11/2007    Priority: Low  . GERD (gastroesophageal reflux disease) 11/05/2007    Priority: Low  . Gout 10/28/2019    Medications- reviewed and updated Current Outpatient Medications  Medication Sig Dispense Refill  . acetaminophen (TYLENOL) 500 MG tablet Take 500 mg by mouth every 8 (eight) hours as needed for mild pain or headache.     Marland Kitchen amLODipine (NORVASC) 2.5 MG tablet Take 1 tablet (2.5 mg total) by mouth daily. 90 tablet 3  . ELIQUIS 5 MG TABS tablet TAKE 1 TABLET BY MOUTH TWICE A DAY (Patient taking differently: Take 5 mg by mouth 2 (two) times daily. ) 60 tablet 11  . latanoprost (XALATAN) 0.005 % ophthalmic solution Place 1 drop into both eyes at bedtime.   12  . meclizine (ANTIVERT) 25 MG tablet Take 1 tablet (25 mg total) by mouth 3 (three) times daily as needed for dizziness. 30 tablet 0  . memantine (NAMENDA XR) 28 MG CP24 24 hr capsule Take 1 capsule (28 mg total) by mouth daily. 90 capsule 3  . metFORMIN (GLUCOPHAGE-XR) 500 MG 24 hr tablet TAKE 1 TABLET BY MOUTH EVERY DAY WITH BREAKFAST (Patient taking  differently: Take 500 mg by mouth daily with breakfast. ) 30 tablet 5  . metoprolol succinate (TOPROL-XL) 25 MG 24 hr tablet TAKE 1/2 TABLET BY MOUTH EVERY DAY (Patient taking differently: Take 12.5 mg by mouth daily. ) 45 tablet 1  . rosuvastatin (CRESTOR) 20 MG tablet Take 1 tablet (20 mg total) by mouth once a week. 13 tablet 3   No current facility-administered medications for this visit.     Objective:  BP 108/60   Pulse 75   Temp 98.2 F (36.8 C) (Temporal)   Ht 5\' 9"  (1.753 m)   Wt 151 lb (68.5 kg)   SpO2 98%   BMI 22.30 kg/m  Gen: NAD, resting comfortably CV: RRR no  murmurs rubs or gallops Lungs: CTAB no crackles, wheeze, rhonchi Abdomen: soft/nontender/nondistended/normal bowel sounds.  Ext: no edema Skin: warm, dry Neuro: CN II-XII intact, sensation and reflexes normal throughout, 5/5 muscle strength in bilateral upper and lower extremities. Normal finger to nose. Normal rapid alternating movements. No pronator drift.  Normal gait.  dix hallpike positive on the left for nystagmus and recurrent symptoms    Assessment and Plan   # Vertigo/dizziness S: Patient was seen in ED on 12/14/2019- actually went in on [facility] but had prolonged wait. Complained of imbalance and dizziness- concerning for vertigo. He and family were worried about stroke as was EDP and both CT and MRI when at ED. These showed remote right superficial temporal infarct stable from 2019 exam.    He has been taking the Meclizine as directed. When he takes it is helpful. He has not had any falls. He describes the sensation as side to side motion with no nausea or vomiting. Mild improvement but did have some recurrent symptoms this AM  A/P: This appears to be benign paroxysmal positional vertigo to me.  Positive Dix-Hallpike maneuver on the right.  Meclizine is reasonable but we discussed can cause confusion and family should monitor closely.  We will refer to vestibular rehab for next week-they were unable to go this afternoon.  Can try this over the weekend Richrd Sox, MD Vertigo Treatment Oct 11 [email or web address]  #hypertension #CKD stage III S: medication: amlodipine 2.5 mg, metoprolol 12.5 mg extended release, hctz 12.5mg  Home readings #s: does not check- agrees to get a cuff  Recent creatinine up to 1.7 in the emergency room but may have been somewhat dehydrated as waited in the emergency room for prolonged period BP Readings from Last 3 Encounters:  12/17/19 108/60  12/15/19 115/70  11/18/19 108/60  A/P: Good control of blood pressure-with recent  dizziness/vertigo and with how low blood pressure is in with recent worsening of kidney function with creatinine up to 1.7 opted to stop hydrochlorothiazide-if kidney function does not significantly improve on labs today I may repeat again shortly-we are planning on 1 month recheck of blood pressure and may check at that time  Hoping CKD stage III is stable-as above stop hydrochlorothiazide to see if that is helpful  # Diabetes S: Medication: metformin 500mg  daily XR Lab Results  Component Value Date   HGBA1C 7.0 (H) 09/15/2019   HGBA1C 6.7 (H) 04/14/2019   HGBA1C 6.7 (H) 12/31/2018   A/P: Since we are updating blood work we will go ahead and check A1c today-hopefully controlled and continue Metformin  #hyperlipidemia S: Medication: Rosuvastatin 20 mg once a week Lab Results  Component Value Date   CHOL 128 12/31/2018   HDL 38.90 (L) 12/31/2018   LDLCALC 60 12/31/2018  LDLDIRECT 83.0 09/15/2019   TRIG 55 05/03/2019   CHOLHDL 3 12/31/2018   A/P: Excellent control last check-very close to years we will go ahead and update lipid panel  # Atrial fibrillation S: Rate controlled with metoprolol 25 mg extended release-only takes 12.5 mg Anticoagulated with Eliquis 5 mg twice daily A/P: Appropriately rate controlled.  Properly anticoagulated-that we may have to decrease Eliquis to 2.5 mg twice daily if creatinine remains above 1.5  Recommended follow up: 1 to 41-month blood pressure check Future Appointments  Date Time Provider [place]  02/17/2020  3:40 PM Marin Olp, MD [facility]  03/10/2020  2:40 PM Yong Channel Brayton Mars, MD [facility]    Lab/Order associations:   ICD-10-CM   1. Benign paroxysmal positional vertigo, unspecified laterality  H81.10 PT Vestibular Evaluation  2. Type 2 diabetes mellitus with chronic kidney disease, without long-term current use of insulin, unspecified CKD stage ([name])  E11.22 Hemoglobin A1c  3. Essential hypertension  I10 CBC with  Differential/Platelet    Basic metabolic panel  4. Hyperlipidemia associated with type 2 diabetes mellitus ([name])  E11.69 Lipid panel   E78.5   5. Benign paroxysmal positional vertigo of right ear  H81.11   6. AF (paroxysmal atrial fibrillation) ([place])  I48.0     Return precautions advised.  [name], MD

## 2019-12-17 NOTE — Assessment & Plan Note (Signed)
#  hypertension #CKD stage III S: medication: amlodipine 2.5 mg, metoprolol 25 mg, hctz 12.5mg  Home readings #s: does not check- agrees to get a cuff  Recent creatinine up to 1.7 in the emergency room but may have been somewhat dehydrated as waited in the emergency room for prolonged period BP Readings from Last 3 Encounters:  12/17/19 108/60  12/15/19 115/70  11/18/19 108/60  A/P: Good control of blood pressure-with recent dizziness/vertigo and with how low blood pressure is in with recent worsening of kidney function with creatinine up to 1.7 opted to stop hydrochlorothiazide-if kidney function does not significantly improve on labs today I may repeat again shortly  Hoping CKD stage III is stable-as above stop hydrochlorothiazide to see if that is helpful

## 2020-01-10 ENCOUNTER — Other Ambulatory Visit: Payer: Self-pay | Admitting: Family Medicine

## 2020-01-19 DIAGNOSIS — Z961 Presence of intraocular lens: Secondary | ICD-10-CM | POA: Diagnosis not present

## 2020-01-19 DIAGNOSIS — E119 Type 2 diabetes mellitus without complications: Secondary | ICD-10-CM | POA: Diagnosis not present

## 2020-01-19 DIAGNOSIS — H401132 Primary open-angle glaucoma, bilateral, moderate stage: Secondary | ICD-10-CM | POA: Diagnosis not present

## 2020-01-19 DIAGNOSIS — H18453 Nodular corneal degeneration, bilateral: Secondary | ICD-10-CM | POA: Diagnosis not present

## 2020-01-19 LAB — HM DIABETES EYE EXAM

## 2020-01-25 ENCOUNTER — Other Ambulatory Visit: Payer: Self-pay | Admitting: Family Medicine

## 2020-02-17 ENCOUNTER — Ambulatory Visit (INDEPENDENT_AMBULATORY_CARE_PROVIDER_SITE_OTHER): Payer: PPO | Admitting: Family Medicine

## 2020-02-17 ENCOUNTER — Encounter: Payer: Self-pay | Admitting: Family Medicine

## 2020-02-17 ENCOUNTER — Other Ambulatory Visit: Payer: Self-pay

## 2020-02-17 VITALS — BP 118/78 | HR 51 | Temp 98.6°F | Ht 69.0 in | Wt 159.0 lb

## 2020-02-17 DIAGNOSIS — E1122 Type 2 diabetes mellitus with diabetic chronic kidney disease: Secondary | ICD-10-CM | POA: Diagnosis not present

## 2020-02-17 DIAGNOSIS — I480 Paroxysmal atrial fibrillation: Secondary | ICD-10-CM

## 2020-02-17 DIAGNOSIS — E1169 Type 2 diabetes mellitus with other specified complication: Secondary | ICD-10-CM

## 2020-02-17 DIAGNOSIS — F0150 Vascular dementia without behavioral disturbance: Secondary | ICD-10-CM | POA: Diagnosis not present

## 2020-02-17 DIAGNOSIS — I10 Essential (primary) hypertension: Secondary | ICD-10-CM

## 2020-02-17 DIAGNOSIS — N183 Chronic kidney disease, stage 3 unspecified: Secondary | ICD-10-CM

## 2020-02-17 DIAGNOSIS — E785 Hyperlipidemia, unspecified: Secondary | ICD-10-CM

## 2020-02-17 LAB — BASIC METABOLIC PANEL WITH GFR
BUN/Creatinine Ratio: 13 (calc) (ref 6–22)
BUN: 22 mg/dL (ref 7–25)
CO2: 29 mmol/L (ref 20–32)
Calcium: 8.8 mg/dL (ref 8.6–10.3)
Chloride: 104 mmol/L (ref 98–110)
Creat: 1.69 mg/dL — ABNORMAL HIGH (ref 0.70–1.11)
GFR, Est African American: 42 mL/min/{1.73_m2} — ABNORMAL LOW (ref >=60)
GFR, Est Non African American: 36 mL/min/{1.73_m2} — ABNORMAL LOW (ref >=60)
Glucose, Bld: 103 mg/dL — ABNORMAL HIGH (ref 65–99)
Potassium: 4 mmol/L (ref 3.5–5.3)
Sodium: 138 mmol/L (ref 135–146)

## 2020-02-17 NOTE — Progress Notes (Signed)
Phone [phone] In person visit   Subjective:   Calvin Burnett is a 86 y.o. year old very pleasant male patient who presents for/with See problem oriented charting Chief Complaint  Patient presents with  . Follow-up  . Hypertension   This visit occurred during the SARS-CoV-2 public health emergency.  Safety protocols were in place, including screening questions prior to the visit, additional usage of staff PPE, and extensive cleaning of exam room while observing appropriate contact time as indicated for disinfecting solutions.   Past Medical History-  Patient Active Problem List   Diagnosis Date Noted  . Intra-abdominal abscess ([place]) 04/24/2019    Priority: High  . B12 deficiency 12/04/2017    Priority: High  . pT1pN0 colon cancer s/p robotic right colectomy 11/26/2017 10/02/2017    Priority: High  . AF (paroxysmal atrial fibrillation) ([place]) 04/09/2017    Priority: High  . Night sweats 09/16/2016    Priority: High  . Major depressive disorder with single episode, in full remission ([place]) 07/29/2016    Priority: High  . Rectal pain 10/14/2014    Priority: High  . Prostate cancer ([place]) 08/12/2012    Priority: High  . Type 2 diabetes mellitus ([place]) 07/31/2010    Priority: High  . Vascular dementia ([place]) 07/27/2008    Priority: High  . BPPV (benign paroxysmal positional vertigo) 12/17/2019    Priority: Medium  . History of adenomatous polyp of colon 10/27/2014    Priority: Medium  . Syncope 09/27/2014    Priority: Medium  . CKD (chronic kidney disease), stage III ([place]) 06/01/2014    Priority: Medium  . Hyperlipidemia associated with type 2 diabetes mellitus ([place]) 03/01/2014    Priority: Medium  . Basal cell carcinoma 05/03/2009    Priority: Medium  . Essential hypertension 01/30/2007    Priority: Medium  . Aortic atherosclerosis ([place]) 01/29/2019    Priority: Low  . Stricture of sigmoid s/p robotic sigmoidectomy 11/26/2017 11/26/2017    Priority: Low  . Chronic  anticoagulation 11/26/2017    Priority: Low  . Right inguinal hernia 10/02/2017    Priority: Low  . Bowel habit changes 10/14/2014    Priority: Low  . Bradycardia 09/27/2014    Priority: Low  . LEG CRAMPS, NOCTURNAL 04/30/2010    Priority: Low  . Cervical spondylosis without myelopathy 12/11/2007    Priority: Low  . GERD (gastroesophageal reflux disease) 11/05/2007    Priority: Low  . Gout 10/28/2019    Medications- reviewed and updated Current Outpatient Medications  Medication Sig Dispense Refill  . acetaminophen (TYLENOL) 500 MG tablet Take 500 mg by mouth every 8 (eight) hours as needed for mild pain or headache.     Marland Kitchen amLODipine (NORVASC) 2.5 MG tablet Take 1 tablet (2.5 mg total) by mouth daily. 90 tablet 3  . ELIQUIS 5 MG TABS tablet TAKE 1 TABLET BY MOUTH TWICE A DAY (Patient taking differently: Take 5 mg by mouth 2 (two) times daily. ) 60 tablet 11  . latanoprost (XALATAN) 0.005 % ophthalmic solution Place 1 drop into both eyes at bedtime.   12  . meclizine (ANTIVERT) 25 MG tablet Take 1 tablet (25 mg total) by mouth 3 (three) times daily as needed for dizziness. 30 tablet 0  . memantine (NAMENDA XR) 28 MG CP24 24 hr capsule Take 1 capsule (28 mg total) by mouth daily. 90 capsule 3  . metFORMIN (GLUCOPHAGE-XR) 500 MG 24 hr tablet TAKE 1 TABLET BY MOUTH EVERY DAY WITH BREAKFAST 30 tablet 5  .  metoprolol succinate (TOPROL-XL) 25 MG 24 hr tablet TAKE 1/2 TABLET BY MOUTH EVERY DAY 45 tablet 1  . rosuvastatin (CRESTOR) 20 MG tablet Take 1 tablet (20 mg total) by mouth once a week. 13 tablet 3   No current facility-administered medications for this visit.     Objective:  BP 118/78   Pulse (!) 51   Temp 98.6 F (37 C)   Ht 5\' 9"  (1.753 m)   Wt 159 lb (72.1 kg)   SpO2 96%   BMI 23.48 kg/m  Gen: NAD, resting comfortably CV: Bradycardic Lungs: CTAB no crackles, wheeze, rhonchi Ext: no edema Skin: warm, dry Neuro: Defers multiple answers to his son    Assessment and  Plan   #Vascular dementia S: Patient is compliant with memantine/Namenda.  Patient has been on memantine for many years.   A/P:  In the past patient has declined neurology work-up as of 2020-we need to check again in 2021- asked today and he would like to see them for their opinion  #Atrial fibrillation S: Patient is anticoagulated with Eliquis 5 mg twice daily as well as rate controlled with metoprolol 12.5 mg extended release. No chest pain, sob, palpitaitons A/P: Stable. Continue current medications.    # Diabetes S: Compliant withMetformin 500 mg daily with breakfast  Still walking a mile a day Lab Results  Component Value Date   HGBA1C 6.6 (H) 12/17/2019  A/P: too early to check a1c- suspect controlled- continue current meds   #hyperlipidemia/history of stroke-may have been atrial fibrillation related S: compliant with rosuvastatin 20 mg Lab Results  Component Value Date   CHOL 105 12/17/2019   HDL 32.00 (L) 12/17/2019   LDLCALC 57 12/17/2019   LDLDIRECT 83.0 09/15/2019   TRIG 79.0 12/17/2019   CHOLHDL 3 12/17/2019  A/P: Stable. Continue current medications.     #hypertension/CKD stage III S: compliant with  metoprolol 12.5 mg extended release, amlodipine 2.5mg .  - hctz stopped 12/17/19 visit and no more dizziness and feeling well overal.   CKD stage III with creatinine ranging from 1.2-1.5 for the most part- slightly higher after hospitalization.  Night sweats in the past on ramipril and urine microalbumin to creatinine ratio has not been elevated so we have not restarted ACE inhibitor BP Readings from Last 3 Encounters:  02/17/20 118/78  12/17/19 108/60  12/15/19 115/70  A/P: Blood pressure looks excellent today-continue current medication  Chronic kidney disease stage III hopefully stable-update BMP with labs today  Recommended follow up: Return in about 14 weeks (around 05/25/2020) for physical or sooner if needed. Future Appointments  Date Time Provider  [place]  03/10/2020  2:40 PM Marin Olp, MD [facility]    Lab/Order associations:   ICD-10-CM   1. Essential hypertension  I10   2. Vascular dementia without behavioral disturbance ([place])  F01.50 Ambulatory referral to Neurology  3. Type 2 diabetes mellitus with chronic kidney disease, without long-term current use of insulin, unspecified CKD stage ([name])  E11.22   4. AF (paroxysmal atrial fibrillation) ([name])  I48.0   5. Hyperlipidemia associated with type 2 diabetes mellitus ([name])  E11.69    E78.5   6. Stage 3 chronic kidney disease, unspecified whether stage 3a or 3b CKD  N18.30    Return precautions advised.  [name], MD

## 2020-02-17 NOTE — Patient Instructions (Addendum)
Health Maintenance Due  Topic Date Due  . OPHTHALMOLOGY EXAM -needs to schedule 04/07/2019  . INFLUENZA VACCINE-recommend a flu shot in the fall-we should have an office within 1 to 2 months but also can get at [facility] or any other pharmacy 02/13/2020   Check with [facility] believe you need your final Shingrix shot-have them send me a copy  Please stop by lab before you go If you have mychart- we will send your results within 3 business days of [place] receiving them.  If you do not have mychart- we will call you about results within 5 business days of [place] receiving them.  *please note we are currently using [facility] which has a longer processing time than [facility] typically so labs may not come back as quickly as in the past *please also note that you will see labs on mychart as soon as they post. I will later go in and write notes on them- will say "notes from [name]"  Cancel august visit before you go and schedule physical at least 14 weeks out- mid nov to early december

## 2020-02-18 ENCOUNTER — Other Ambulatory Visit: Payer: Self-pay

## 2020-02-18 ENCOUNTER — Encounter: Payer: Self-pay | Admitting: Neurology

## 2020-02-18 MED ORDER — APIXABAN 2.5 MG PO TABS: 5.0000 mg | ORAL_TABLET | Freq: Two times a day (BID) | ORAL | 3 refills | Status: DC

## 2020-02-21 ENCOUNTER — Other Ambulatory Visit: Payer: Self-pay

## 2020-02-21 MED ORDER — APIXABAN 2.5 MG PO TABS: 5.0000 mg | ORAL_TABLET | Freq: Two times a day (BID) | ORAL | 3 refills | Status: DC
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
## 2020-03-09 NOTE — Patient Instructions (Addendum)
Health Maintenance Due  Topic Date Due  . OPHTHALMOLOGY EXAM - Sign release of information at the check out desk for copy of last eye exam 04/07/2019  . INFLUENZA VACCINE - - will complete later in flu season (please let us know if you get this at another location so we can update your chart) . We should have vaccination here in 1-2 months - can call back for an appointment.   02/13/2020   Get plugged back into your dentist  Definitely get some new glasses. Could try zenni online- there are a lot of other options too- you just need prescription from your eye doctor

## 2020-03-10 ENCOUNTER — Ambulatory Visit (INDEPENDENT_AMBULATORY_CARE_PROVIDER_SITE_OTHER): Payer: PPO | Admitting: Family Medicine

## 2020-03-10 ENCOUNTER — Other Ambulatory Visit: Payer: Self-pay

## 2020-03-10 ENCOUNTER — Encounter: Payer: Self-pay | Admitting: Family Medicine

## 2020-03-10 VITALS — BP 115/80 | HR 68 | Ht 69.0 in | Wt 156.2 lb

## 2020-03-10 DIAGNOSIS — E1169 Type 2 diabetes mellitus with other specified complication: Secondary | ICD-10-CM

## 2020-03-10 DIAGNOSIS — E538 Deficiency of other specified B group vitamins: Secondary | ICD-10-CM

## 2020-03-10 DIAGNOSIS — M109 Gout, unspecified: Secondary | ICD-10-CM

## 2020-03-10 DIAGNOSIS — I10 Essential (primary) hypertension: Secondary | ICD-10-CM

## 2020-03-10 DIAGNOSIS — K219 Gastro-esophageal reflux disease without esophagitis: Secondary | ICD-10-CM

## 2020-03-10 DIAGNOSIS — E1122 Type 2 diabetes mellitus with diabetic chronic kidney disease: Secondary | ICD-10-CM | POA: Diagnosis not present

## 2020-03-10 DIAGNOSIS — E785 Hyperlipidemia, unspecified: Secondary | ICD-10-CM | POA: Diagnosis not present

## 2020-03-10 DIAGNOSIS — Z0000 Encounter for general adult medical examination without abnormal findings: Secondary | ICD-10-CM | POA: Diagnosis not present

## 2020-03-10 MED ORDER — BLOOD GLUCOSE MONITOR KIT: PACK | 0 refills | Status: DC

## 2020-03-13 DIAGNOSIS — H524 Presbyopia: Secondary | ICD-10-CM | POA: Diagnosis not present

## 2020-03-21 ENCOUNTER — Telehealth: Payer: Self-pay | Admitting: Family Medicine

## 2020-03-21 NOTE — Telephone Encounter (Signed)
See below, from your last note " Patient is anticoagulated with Eliquis 2.5 mg twice daily as well". Is he supposed to be splitting this?

## 2020-03-21 NOTE — Telephone Encounter (Signed)
Patient is requesting a call.  States he picked up his eliquis today from the pharmacy and the instructions were different that what he remembered.   States he was previously taking one pill (did not know the milligram),  splitting the pill and taking one half in the morning and one half in the evening.    States that the instructions are to take 2 tablets twice daily.    Patient would like to know how he needs to continue taking meds.   Patient states he will take meds as directed on the bottle for today.

## 2020-03-22 ENCOUNTER — Other Ambulatory Visit: Payer: Self-pay

## 2020-03-22 MED ORDER — APIXABAN 2.5 MG PO TABS: 2.5000 mg | ORAL_TABLET | Freq: Two times a day (BID) | ORAL | 3 refills | Status: DC

## 2020-03-22 MED ORDER — APIXABAN 2.5 MG PO TABS: 2.5000 mg | ORAL_TABLET | Freq: Every day | ORAL | 3 refills | Status: DC

## 2020-03-22 NOTE — Telephone Encounter (Signed)
Should be Eliquis 2.5 mg twice daily.  He should only be taking 1 pill of the Eliquis 2.5 mg twice daily.  It looks like when I reordered this it defaulted to take 2 pills-please apologize to patient-I updated the prescription

## 2020-03-22 NOTE — Telephone Encounter (Signed)
Called and spoke with pt daughter and gave below message.  

## 2020-03-22 NOTE — Addendum Note (Signed)
Addended by: Marin Olp on: 03/22/2020 02:18 PM   Modules accepted: Orders

## 2020-03-28 ENCOUNTER — Telehealth: Payer: Self-pay | Admitting: Family Medicine

## 2020-03-28 NOTE — Telephone Encounter (Signed)
Patient is scheduled for [facility]

## 2020-03-28 NOTE — Telephone Encounter (Signed)
Please call and schedule OV for pt.  °

## 2020-03-28 NOTE — Telephone Encounter (Signed)
Relationship To Patient Son Return Phone Number [phone] (Primary) Chief Complaint Back Injury Reason for Call Symptomatic / Request for Health Information Initial Comment Caller states father having back pain. Dad was walking up a hill yesterday and thinks might have pulled a muscle. Dad grimaces in pain when he tries to get up. When he tries to get up from a sitting position he has a lot of pain in the lower back area right above the buttocks. Translation No Disp. Time Eilene Ghazi Time) Disposition Final User 03/27/2020 9:04:12 PM Attempt made - message left Tristan Schroeder 03/27/2020 9:24:13 PM FINAL ATTEMPT MADE - message left Yes Dalton, RN, [place]

## 2020-03-29 ENCOUNTER — Ambulatory Visit (INDEPENDENT_AMBULATORY_CARE_PROVIDER_SITE_OTHER): Payer: PPO | Admitting: Family Medicine

## 2020-03-29 ENCOUNTER — Other Ambulatory Visit: Payer: Self-pay

## 2020-03-29 ENCOUNTER — Encounter: Payer: Self-pay | Admitting: Family Medicine

## 2020-03-29 ENCOUNTER — Ambulatory Visit (INDEPENDENT_AMBULATORY_CARE_PROVIDER_SITE_OTHER)
Admission: RE | Admit: 2020-03-29 | Discharge: 2020-03-29 | Disposition: A | Discharge status: Home / Self Care | Payer: PPO | Source: Ambulatory Visit | Attending: Family Medicine | Admitting: Family Medicine

## 2020-03-29 VITALS — BP 100/60 | HR 50 | Temp 98.2°F | Ht 69.0 in | Wt 153.4 lb

## 2020-03-29 DIAGNOSIS — M545 Low back pain, unspecified: Secondary | ICD-10-CM

## 2020-03-29 DIAGNOSIS — M47816 Spondylosis without myelopathy or radiculopathy, lumbar region: Secondary | ICD-10-CM | POA: Diagnosis not present

## 2020-03-29 DIAGNOSIS — M461 Sacroiliitis, not elsewhere classified: Secondary | ICD-10-CM

## 2020-03-29 DIAGNOSIS — M4186 Other forms of scoliosis, lumbar region: Secondary | ICD-10-CM | POA: Diagnosis not present

## 2020-03-29 DIAGNOSIS — I700 Atherosclerosis of aorta: Secondary | ICD-10-CM | POA: Diagnosis not present

## 2020-03-29 MED ORDER — PREDNISONE 20 MG PO TABS: 40.0000 mg | ORAL_TABLET | Freq: Every day | ORAL | 0 refills | Status: DC

## 2020-03-29 NOTE — Patient Instructions (Addendum)
I have ordered xrays for you. At this time we do not have xrays in our clinic. You will have to go to our [facility] clinic. The address is [address]  xray is located in the basement.  Hours of operation are M-F 8:30am to 5:00pm.  Closed for lunch between 12:30 and 1:00pm.    -get voltaren gel over the counter and can put on low back four times a day. Exercises given.  -sports med referral done, they will call you with this.  -steroid burst x 5 days -may need PT, but would like to get xrays back first  -if worsening symptoms will need to get MRI, but lets see xrays and see what sports medicine thinks as well.   Very nice to meet you! [name]

## 2020-03-29 NOTE — Progress Notes (Signed)
Patient: Calvin Burnett MRN: [id number] DOB: 07-Jun-1934 PCP: Marin Olp, MD     Subjective:  Chief Complaint  Patient presents with  . Back Pain    HPI: The patient is a 86 y.o. male who presents today for back pain that started about 4 days ago. His son says that he was walking up a hill on Saturday, when it began. He has tried  hot/cold compresses, icy hot, and OTC Tylenol which has not helped. Pain is entire lower back. Pain is rated as a 8/10 and is sharp in in nature. More so in the right side. Pain does not radiate. He denies any radicular symptoms going down legs specifically numbness, tingling, weakness. He has no pain with sitting or lying flat. He has really bad pain with trying to stand up from sitting. He doesn't have pain with walking and hasn't tried walking up a hill again. He has CKD, can not take NSAIDs. Chronically anti-coagulated. Hx of prostate cancer 10 years ago. He got seeds, no surgery.   Review of Systems  Respiratory: Negative for shortness of breath and wheezing.   Genitourinary: Negative for flank pain.  Musculoskeletal: Positive for back pain. Negative for gait problem, neck pain and neck stiffness.    Allergies Patient is allergic to [place] [prothrombin complex conc human].  Past Medical History Patient  has a past medical history of Diabetes mellitus, ED (erectile dysfunction), GERD (gastroesophageal reflux disease), History of basal cell carcinoma excision, History of cerebral parenchymal hemorrhage, Hypertension, and Prostate cancer ([place]) (08/12/12).  Surgical History Patient  has a past surgical history that includes Prostate biopsy (08/12/12); Appendectomy (age 65); Cholecystectomy (1980); Cataract extraction w/ intraocular lens  implant, bilateral; Radioactive seed implant (N/A, 12/03/2012); XI robotic assisted lower anterior resection (N/A, 11/26/2017); Proctoscopy (N/A, 11/26/2017); Colonoscopy (N/A, 11/26/2017); laparotomy (N/A, 04/27/2019); and  Colon resection (N/A, 04/27/2019).  Family History Pateint's family history includes COPD in his mother; Cancer in his daughter; Diabetes in his daughter and sister; Emphysema in his mother; Lung cancer in his father.  Social History Patient  reports that he quit smoking about 29 years ago. His smoking use included cigars. He has a 2.00 pack-year smoking history. He has never used smokeless tobacco. He reports that he does not drink alcohol and does not use drugs.    Objective: Vitals:   03/29/20 1126  BP: 100/60  Pulse: (!) 50  Temp: 98.2 F (36.8 C)  TempSrc: Temporal  SpO2: 98%  Weight: 153 lb 6.4 oz (69.6 kg)  Height: 5\' 9"  (1.753 m)    Body mass index is 22.65 kg/m.  Physical Exam Vitals reviewed.  Constitutional:      Appearance: Normal appearance. He is normal weight.  HENT:     Head: Normocephalic and atraumatic.  Pulmonary:     Effort: Pulmonary effort is normal.     Breath sounds: Normal breath sounds.  Musculoskeletal:     Comments: TTP over bilateral SI joints. +straight leg test on right. Strength intact bilaterally, slightly weaker on the right due to pain (4/5). DTR intact. Gait is normal, but when gets up from sitting has a few steps of limping.   Neurological:     General: No focal deficit present.     Mental Status: He is alert and oriented to person, place, and time.    [facility]bar xray pending [facility]     Assessment/plan: 1. Acute bilateral low back pain without sciatica Sacroiliitis vs. Strain. Vs. Possible disc disease. Hx of  prostate cancer-needs xray. Will get this done today. Referral to sports med and handout given on SI exercises. Burst of steroids. Can not do nsaids and do not want to use any muscle relaxer that could make him drowsy or increase fall risk with already impaired memory.  Recommended trial voltaren gel. If xray looks okay will also refer to PT. Discussed if not getting better after 2 weeks or radicular symptoms will need to get  MRI. precautions given for urinary incontinence, saddle paraesthesias, foot drop to go to ER.   2. Sacroiliitis ([place])  - DG Lumbar Spine 2-3 Views; Future - Ambulatory referral to Sports Medicine  This visit occurred during the SARS-CoV-2 public health emergency.  Safety protocols were in place, including screening questions prior to the visit, additional usage of staff PPE, and extensive cleaning of exam room while observing appropriate contact time as indicated for disinfecting solutions.    Return if symptoms worsen or fail to improve.     Orma Flaming, MD [place]   03/29/2020

## 2020-03-31 ENCOUNTER — Other Ambulatory Visit: Payer: Self-pay | Admitting: Family Medicine

## 2020-03-31 DIAGNOSIS — M461 Sacroiliitis, not elsewhere classified: Secondary | ICD-10-CM

## 2020-03-31 DIAGNOSIS — M545 Low back pain, unspecified: Secondary | ICD-10-CM

## 2020-04-04 ENCOUNTER — Ambulatory Visit: Payer: PPO | Admitting: Family Medicine

## 2020-04-04 ENCOUNTER — Encounter: Payer: Self-pay | Admitting: Family Medicine

## 2020-04-04 ENCOUNTER — Other Ambulatory Visit: Payer: Self-pay

## 2020-04-04 VITALS — BP 102/72 | HR 61 | Ht 69.0 in | Wt 157.6 lb

## 2020-04-04 DIAGNOSIS — S39012A Strain of muscle, fascia and tendon of lower back, initial encounter: Secondary | ICD-10-CM | POA: Diagnosis not present

## 2020-04-04 NOTE — Progress Notes (Signed)
Subjective:    I'm seeing this patient as a consultation for:  [name] and [name]. Note will be routed back to referring provider/PCP.  CC: Acute low back pain  I, Molly Weber, LAT, ATC, am serving as scribe for Dr. Lynne Leader.  HPI: Pt is an 86 y/o male presenting w/ c/o LBP since 03/25/20 that began when he was walking up a hill.  He has been seen by primary care and was prescribed prednisone and referred to PT and Sports Medicine.  He locates his pain to his R lower back.  He rates his improvement at 75% since his symptoms began.  Radiating pain: No LE radicular symptoms: No Aggravating factors: forward flexion;  Treatments tried: steroid dose pack; ice/heat; Icy Hot; Tylenol;   Diagnostic testing: L-spine XR- 03/29/20  Past medical history, Surgical history, Family history, Social history, Allergies, and medications have been entered into the medical record, reviewed.   Review of Systems: No new headache, visual changes, nausea, vomiting, diarrhea, constipation, dizziness, abdominal pain, skin rash, fevers, chills, night sweats, weight loss, swollen lymph nodes, body aches, joint swelling, muscle aches, chest pain, shortness of breath, mood changes, visual or auditory hallucinations.   Objective:    Vitals:   04/04/20 1249  BP: 102/72  Pulse: 61  SpO2: 98%   General: Well Developed, well nourished, and in no acute distress.  Neuro/Psych: Alert and oriented x3, extra-ocular muscles intact, able to move all 4 extremities, sensation grossly intact. Skin: Warm and dry, no rashes noted.  Respiratory: Not using accessory muscles, speaking in full sentences, trachea midline.  Cardiovascular: Pulses palpable, no extremity edema. Abdomen: Does not appear distended. MSK: L-spine normal-appearing Nontender midline.  Tender palpation right lumbar paraspinal musculature and right SI joint. Decreased lumbar motion. Lower extremity strength is intact. Mild antalgic gait  Lab  and Radiology Results DG Lumbar Spine 2-3 Views  Result Date: 03/30/2020 CLINICAL DATA:  Back pain 4 days.  No injury EXAM: LUMBAR SPINE - 2-3 VIEW COMPARISON:  None. FINDINGS: Mild dextroscoliosis. Normal sagittal alignment. Negative for fracture. Mild disc degeneration is present throughout the lumbar spine. No skeletal lesion or pars defect. Surgical clips in the right upper quadrant. Mild atherosclerotic aorta. IMPRESSION: Scoliosis and mild degenerative change throughout the lumbar spine. No acute abnormality Electronically Signed   By: Franchot Gallo M.D.   On: 03/30/2020 15:51   I, Lynne Leader, personally (independently) visualized and performed the interpretation of the images attached in this note.  Degenerative change and scoliosis present.  No acute fractures.  Impression and Recommendations:    Assessment and Plan: 86 y.o. male with right low back pain. Pain due to lumbosacral spasm and dysfunction most likely at quadratus lumborum musculature. He does have degenerative changes in his lumbar spine but did not have a lot of pain up until just recently.  Additionally he may have some sacroiliitis as well as contributing to pain but I think that the lesser issue as well.  Plan for physical therapy which already has been ordered but not yet scheduled.  Provided patient with phone number to previously ordered physical therapy.  Discussed heating pad and TENS unit.  Also discussed muscle relaxer but would like to not use it if possible given his mild memory impairment I am worried about making him worse or increasing risk of fall or confusion.  If not improving recheck in clinic.  Likely will proceed with SI joint injection.  Recheck 4 to 6 weeks.   Discussed  warning signs or symptoms. Please see discharge instructions. Patient expresses understanding.   The above documentation has been reviewed and is accurate and complete Lynne Leader, M.D.

## 2020-04-04 NOTE — Patient Instructions (Addendum)
Thank you for coming in today.  I've referred you to Physical Therapy.  Let us know if you don't hear from them in one week. Phone: [phone]  TENS UNIT: This is helpful for muscle pain and spasm.   Search and Purchase a TENS 7000 2nd edition at  www.tenspros.com or www.[employer].com It should be less than $30.     TENS unit instructions: Do not shower or bathe with the unit on . Turn the unit off before removing electrodes or batteries . If the electrodes lose stickiness add a drop of water to the electrodes after they are disconnected from the unit and place on plastic sheet. If you continued to have difficulty, call the TENS unit company to purchase more electrodes. . Do not apply lotion on the skin area prior to use. Make sure the skin is clean and dry as this will help prolong the life of the electrodes. . After use, always check skin for unusual red areas, rash or other skin difficulties. If there are any skin problems, does not apply electrodes to the same area. . Never remove the electrodes from the unit by pulling the wires. . Do not use the TENS unit or electrodes other than as directed. . Do not change electrode placement without consultating your therapist or physician. Marland Kitchen Keep 2 fingers with between each electrode. . Wear time ratio is 2:1, on to off times.    For example on for 30 minutes off for 15 minutes and then on for 30 minutes off for 15 minutes   Recheck in 4-6 weeks.  Let me know sooner if you have a problem.  We can do in injection here in clinic.

## 2020-04-11 ENCOUNTER — Ambulatory Visit (INDEPENDENT_AMBULATORY_CARE_PROVIDER_SITE_OTHER): Payer: PPO | Admitting: Physical Therapy

## 2020-04-11 ENCOUNTER — Other Ambulatory Visit: Payer: Self-pay

## 2020-04-11 ENCOUNTER — Encounter: Payer: Self-pay | Admitting: Physical Therapy

## 2020-04-11 DIAGNOSIS — M545 Low back pain, unspecified: Secondary | ICD-10-CM

## 2020-04-11 NOTE — Patient Instructions (Signed)
Access Code: [email or web address] URL: [email or web address] Date: 04/11/2020 Prepared by: Lyndee Hensen  Exercises Supine Posterior Pelvic Tilt - 2 x daily - 2 sets - 10 reps Supine Single Knee to Chest Stretch - 2 x daily - 3 reps - 30 hold Seated Hamstring Stretch - 2 x daily - 3 reps - 30 hold

## 2020-04-13 ENCOUNTER — Encounter: Payer: Self-pay | Admitting: Physical Therapy
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
## 2020-04-18 ENCOUNTER — Other Ambulatory Visit: Payer: Self-pay

## 2020-04-18 ENCOUNTER — Encounter: Payer: Self-pay | Admitting: Physical Therapy

## 2020-04-18 ENCOUNTER — Ambulatory Visit (INDEPENDENT_AMBULATORY_CARE_PROVIDER_SITE_OTHER): Payer: PPO | Admitting: Physical Therapy

## 2020-04-18 DIAGNOSIS — M545 Low back pain, unspecified: Secondary | ICD-10-CM

## 2020-04-19 ENCOUNTER — Encounter: Payer: Self-pay | Admitting: Physical Therapy
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
## 2020-04-25 ENCOUNTER — Encounter: Payer: PPO | Admitting: Physical Therapy

## 2020-05-02 ENCOUNTER — Encounter: Payer: Self-pay | Admitting: Physical Therapy

## 2020-05-02 ENCOUNTER — Ambulatory Visit: Payer: PPO | Admitting: Physical Therapy

## 2020-05-02 ENCOUNTER — Other Ambulatory Visit: Payer: Self-pay

## 2020-05-02 DIAGNOSIS — M545 Low back pain, unspecified: Secondary | ICD-10-CM
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
## 2020-05-02 NOTE — Patient Instructions (Signed)
Access Code: [email or web address] URL: [email or web address] Date: 05/02/2020 Prepared by: Lyndee Hensen  Exercises Supine Single Knee to Chest Stretch - 2 x daily - 3 reps - 30 hold Supine Lower Trunk Rotation - 1 x daily - 5 reps - 10 hold Seated Hamstring Stretch - 2 x daily - 3 reps - 30 hold Heel rises with counter support - 1 x daily - 2 sets - 10 reps Standing March with Counter Support - 1 x daily - 2 sets - 10 reps Standing Hip Abduction with Counter Support - 1 x daily - 2 sets - 10 reps Side Stepping with Counter Support - 1 x daily - 2 sets - 10 reps

## 2020-05-16 ENCOUNTER — Telehealth: Payer: Self-pay | Admitting: Family Medicine

## 2020-05-16 NOTE — Telephone Encounter (Signed)
Left message for patient to call back and schedule Medicare Annual Wellness Visit (AWV) either virtually/audio only OR in office. Whatever the patients preference is.  Last AWV 03/11/19; please schedule at anytime with [facility]-Nurse Health Advisor at [facility].  This should be a 45 minute visit.

## 2020-05-25 ENCOUNTER — Ambulatory Visit: Payer: PPO | Admitting: Neurology

## 2020-05-25 ENCOUNTER — Other Ambulatory Visit: Payer: Self-pay

## 2020-05-25 ENCOUNTER — Encounter: Payer: Self-pay | Admitting: Neurology

## 2020-05-25 VITALS — BP 119/75 | HR 56 | Ht 70.0 in | Wt 160.6 lb

## 2020-05-25 DIAGNOSIS — G3184 Mild cognitive impairment, so stated: Secondary | ICD-10-CM
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
## 2020-05-25 NOTE — Patient Instructions (Signed)
Good to meet you. Continue all your medications. Follow-up in 6-8 months, call for any changes.   FALL PRECAUTIONS: Be cautious when walking. Scan the area for obstacles that may increase the risk of trips and falls. When getting up in the mornings, sit up at the edge of the bed for a few minutes before getting out of bed. Consider elevating the bed at the head end to avoid drop of blood pressure when getting up. Walk always in a well-lit room (use night lights in the walls). Avoid area rugs or power cords from appliances in the middle of the walkways. Use a walker or a cane if necessary and consider physical therapy for balance exercise. Get your eyesight checked regularly.  FINANCIAL OVERSIGHT: Supervision, especially oversight when making financial decisions or transactions is also recommended as difficulties arise.  HOME SAFETY: Consider the safety of the kitchen when operating appliances like stoves, microwave oven, and blender. Consider having supervision and share cooking responsibilities until no longer able to participate in those. Accidents with firearms and other hazards in the house should be identified and addressed as well.  DRIVING: Regarding driving, in patients with progressive memory problems, driving will be impaired. We advise to have someone else do the driving if trouble finding directions or if minor accidents are reported. Independent driving assessment is available to determine safety of driving.  ABILITY TO BE LEFT ALONE: If patient is unable to contact 911 operator, consider using LifeLine, or when the need is there, arrange for someone to stay with patients. Smoking is a fire hazard, consider supervision or cessation. Risk of wandering should be assessed by caregiver and if detected at any point, supervision and safe proof recommendations should be instituted.  MEDICATION SUPERVISION: Inability to self-administer medication needs to be constantly addressed. Implement a  mechanism to ensure safe administration of the medications.  RECOMMENDATIONS FOR ALL PATIENTS WITH MEMORY PROBLEMS: 1. Continue to exercise (Recommend 30 minutes of walking everyday, or 3 hours every week) 2. Increase social interactions - continue going to [place] and enjoy social gatherings with friends and family 3. Eat healthy, avoid fried foods and eat more fruits and vegetables 4. Maintain adequate blood pressure, blood sugar, and blood cholesterol level. Reducing the risk of stroke and cardiovascular disease also helps promoting better memory. 5. Avoid stressful situations. Live a simple life and avoid aggravations. Organize your time and prepare for the next day in anticipation. 6. Sleep well, avoid any interruptions of sleep and avoid any distractions in the bedroom that may interfere with adequate sleep quality 7. Avoid sugar, avoid sweets as there is a strong link between excessive sugar intake, diabetes, and cognitive impairment We discussed the Mediterranean diet, which has been shown to help patients reduce the risk of progressive memory disorders and reduces cardiovascular risk. This includes eating fish, eat fruits and green leafy vegetables, nuts like almonds and hazelnuts, walnuts, and also use olive oil. Avoid fast foods and fried foods as much as possible. Avoid sweets and sugar as sugar use has been linked to worsening of memory function.
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
## 2020-06-13 ENCOUNTER — Telehealth (INDEPENDENT_AMBULATORY_CARE_PROVIDER_SITE_OTHER): Payer: PPO | Admitting: General Practice

## 2020-06-13 ENCOUNTER — Encounter: Payer: Self-pay | Admitting: General Practice

## 2020-06-13 VITALS — Wt 164.0 lb

## 2020-06-13 DIAGNOSIS — E1169 Type 2 diabetes mellitus with other specified complication: Secondary | ICD-10-CM

## 2020-06-13 DIAGNOSIS — I482 Chronic atrial fibrillation, unspecified: Secondary | ICD-10-CM

## 2020-06-13 DIAGNOSIS — E785 Hyperlipidemia, unspecified: Secondary | ICD-10-CM

## 2020-06-13 DIAGNOSIS — I10 Essential (primary) hypertension: Secondary | ICD-10-CM

## 2020-06-13 NOTE — Patient Instructions (Signed)
Medication Instructions:  The current medical regimen is effective;  continue present plan and medications as directed. Please refer to the Current Medication list given to you today.  *If you need a refill on your cardiac medications before your next appointment, please call your pharmacy*  Lab Work:   Testing/Procedures:  NONE    NONE  Special Instructions PURCHASE A BLOOD PRESSURE CUFF-PLEASE PURCHASE AN UPPER ARM BLOOD PRESSURE CUFF  TAKE AND LOG YOUR BLOOD PRESSURE WEEKLY  PLEASE READ AND FOLLOW SALTY 6-ATTACHED-1,800mg  daily  PLEASE MAINTAIN YOUR PHYSICAL ACTIVITY AS TOLERATED  Follow-Up: Your next appointment:  12 month(s) In Person with You may see Kirk Ruths, MD or one of the following Advanced Practice Providers on your designated Care Team:  Kerin Ransom, PA-C  [place], [place]  Coletta Memos, FNP  Please call our office 2 months in advance(SEPTEMBER 2022) to schedule this appointment   At [facility], you and your health needs are our priority.  As part of our continuing mission to provide you with exceptional heart care, we have created designated Provider Care Teams.  These Care Teams include your primary Cardiologist (physician) and Advanced Practice Providers (APPs -  Physician Assistants and Nurse Practitioners) who all work together to provide you with the care you need, when you need it.            6 SALTY THINGS TO AVOID     1,800MG  DAILY

## 2020-06-19 ENCOUNTER — Other Ambulatory Visit: Payer: Self-pay | Admitting: Family Medicine

## 2020-06-21 DIAGNOSIS — H0288A Meibomian gland dysfunction right eye, upper and lower eyelids: Secondary | ICD-10-CM | POA: Diagnosis not present

## 2020-06-21 DIAGNOSIS — Z961 Presence of intraocular lens: Secondary | ICD-10-CM | POA: Diagnosis not present

## 2020-06-21 DIAGNOSIS — H401132 Primary open-angle glaucoma, bilateral, moderate stage: Secondary | ICD-10-CM | POA: Diagnosis not present

## 2020-06-21 DIAGNOSIS — H0288B Meibomian gland dysfunction left eye, upper and lower eyelids: Secondary | ICD-10-CM | POA: Diagnosis not present

## 2020-06-21 DIAGNOSIS — H18523 Epithelial (juvenile) corneal dystrophy, bilateral: Secondary | ICD-10-CM | POA: Diagnosis not present

## 2020-06-21 LAB — HM DIABETES EYE EXAM

## 2020-06-28 ENCOUNTER — Telehealth: Payer: Self-pay | Admitting: Family Medicine

## 2020-06-28 NOTE — Progress Notes (Signed)
  Chronic Care Management   Outreach Note  06/28/2020 Name: MANLY NESTLE MRN: [id number] DOB: 15-Apr-1934  Referred by: Marin Olp, MD Reason for referral : No chief complaint on file.   An unsuccessful telephone outreach was attempted today. The patient was referred to the pharmacist for assistance with care management and care coordination.   Follow Up Plan:   Lauretta Grill Upstream Scheduler

## 2020-07-05 ENCOUNTER — Other Ambulatory Visit: Payer: Self-pay | Admitting: Family Medicine

## 2020-07-06 ENCOUNTER — Other Ambulatory Visit: Payer: Self-pay | Admitting: Family Medicine

## 2020-07-19 NOTE — Patient Instructions (Incomplete)
Health Maintenance Due  Topic Date Due  . OPHTHALMOLOGY EXAM -We will call you within two weeks about your referral to eye doctor. If you do not hear within 3 weeks, give [place] a call.   04/07/2019   Please stop by lab before you go If you have mychart- we will send your results within 3 business days of [place] receiving them.  If you do not have mychart- we will call you about results within 5 business days of [place] receiving them.  *please also note that you will see labs on mychart as soon as they post. I will later go in and write notes on them- will say "notes from Dr. Durene Cal"  Lets call [facility] back and schedule follow up visit- they mentioned potential scope of bladder after last visit. im glad you have not had more blood in urine- id still like their opinion.   Can schedule annual wellness visit with our nurse specialist Inetta Fermo if you would like before you leave

## 2020-07-19 NOTE — Progress Notes (Signed)
Phone [phone] In person visit   Subjective:   Calvin Burnett is a 87 y.o. year old very pleasant male patient who presents for/with See problem oriented charting Chief Complaint  Patient presents with  . Atrial Fibrillation  . Diabetes  . Hyperlipidemia   This visit occurred during the SARS-CoV-2 public health emergency.  Safety protocols were in place, including screening questions prior to the visit, additional usage of staff PPE, and extensive cleaning of exam room while observing appropriate contact time as indicated for disinfecting solutions.   Past Medical History-  Patient Active Problem List   Diagnosis Date Noted  . Intra-abdominal abscess ([place]) 04/24/2019    Priority: High  . B12 deficiency 12/04/2017    Priority: High  . pT1pN0 colon cancer s/p robotic right colectomy 11/26/2017 10/02/2017    Priority: High  . AF (paroxysmal atrial fibrillation) ([place]) 04/09/2017    Priority: High  . Night sweats 09/16/2016    Priority: High  . Major depressive disorder with single episode, in full remission ([place]) 07/29/2016    Priority: High  . Rectal pain 10/14/2014    Priority: High  . Prostate cancer ([place]) 08/12/2012    Priority: High  . Type 2 diabetes mellitus ([place]) 07/31/2010    Priority: High  . Vascular dementia ([place]) 07/27/2008    Priority: High  . BPPV (benign paroxysmal positional vertigo) 12/17/2019    Priority: Medium  . History of adenomatous polyp of colon 10/27/2014    Priority: Medium  . Syncope 09/27/2014    Priority: Medium  . CKD (chronic kidney disease), stage III ([place]) 06/01/2014    Priority: Medium  . Hyperlipidemia associated with type 2 diabetes mellitus ([place]) 03/01/2014    Priority: Medium  . Basal cell carcinoma 05/03/2009    Priority: Medium  . Essential hypertension 01/30/2007    Priority: Medium  . Aortic atherosclerosis ([place]) 01/29/2019    Priority: Low  . Stricture of sigmoid s/p robotic sigmoidectomy 11/26/2017 11/26/2017     Priority: Low  . Chronic anticoagulation 11/26/2017    Priority: Low  . Right inguinal hernia 10/02/2017    Priority: Low  . Bowel habit changes 10/14/2014    Priority: Low  . Bradycardia 09/27/2014    Priority: Low  . LEG CRAMPS, NOCTURNAL 04/30/2010    Priority: Low  . Cervical spondylosis without myelopathy 12/11/2007    Priority: Low  . GERD (gastroesophageal reflux disease) 11/05/2007    Priority: Low  . Gout 10/28/2019    Medications- reviewed and updated Current Outpatient Medications  Medication Sig Dispense Refill  . amLODipine (NORVASC) 2.5 MG tablet Take 1 tablet (2.5 mg total) by mouth daily. 90 tablet 3  . apixaban (ELIQUIS) 2.5 MG TABS tablet Take 1 tablet (2.5 mg total) by mouth 2 (two) times daily. 180 tablet 3  . blood glucose meter kit and supplies KIT Dispense based on patient and insurance preference. Use up to four times daily as directed. Dx E11.9 1 each 0  . latanoprost (XALATAN) 0.005 % ophthalmic solution Place 1 drop into both eyes at bedtime.   12  . memantine (NAMENDA XR) 28 MG CP24 24 hr capsule Take 1 capsule (28 mg total) by mouth daily. 90 capsule 3  . metFORMIN (GLUCOPHAGE-XR) 500 MG 24 hr tablet TAKE 1 TABLET BY MOUTH EVERY DAY WITH BREAKFAST 30 tablet 5  . metoprolol succinate (TOPROL-XL) 25 MG 24 hr tablet TAKE 1/2 TABLET BY MOUTH EVERY DAY 45 tablet 1  . rosuvastatin (CRESTOR) 20 MG tablet  TAKE 1 TABLET (20 MG TOTAL) BY MOUTH ONCE A WEEK. 13 tablet 3   No current facility-administered medications for this visit.     Objective:  BP 122/70   Pulse (!) 51   Temp 98 F (36.7 C) (Temporal)   Resp 18   Ht 5' 10"  (1.778 m)   Wt 155 lb 12.8 oz (70.7 kg)   SpO2 98%   BMI 22.35 kg/m  Gen: NAD, resting comfortably CV: bradycardic no murmurs rubs or gallops Lungs: CTAB no crackles, wheeze, rhonchi Abdomen: soft/nontender/nondistended/normal bowel sounds. Ext: no edema Skin: warm, dry    Assessment and Plan   #History of prostate  cancer-saw urology April 2021 for gross hematuria.  Plan with antibiotics and 2-week cystoscopy.  Last PSA was 2019  #Vascular dementia- follows with Dr. Delice Lesch S: Patient is compliant with memantine/Namenda-has follow-up with Dr. Delice Lesch later this year.  Patient has been on memantine for many years.  A/P: Stable. Continue current medications. Continue Dr. Delice Lesch follow up    #Atrial fibrillation S: Patient is anticoagulated with Eliquis 2.5 mg twice daily as well as rate controlled with metoprolol 12.5 mg extended release A/P: Appropriately anticoagulated.  Rate controlled with metoprolol-continue current medication  # Diabetes S: Compliant withMetformin 500 mg daily with breakfast CBGs- doesn't check Exercise and diet- doing his walking 16 minutes everyday but Sunday  Lab Results  Component Value Date   HGBA1C 6.6 (H) 12/17/2019   HGBA1C 7.0 (H) 09/15/2019   HGBA1C 6.7 (H) 04/14/2019  A/P: Reasonable control last visit-update labs today. Continue current medicine   #hyperlipidemia/history of stroke-may have been atrial fibrillation related #aortic atherosclerosis S: compliant with rosuvastatin 20 mg once a week Lab Results  Component Value Date   CHOL 105 12/17/2019   HDL 32.00 (L) 12/17/2019   LDLCALC 57 12/17/2019   LDLDIRECT 83.0 09/15/2019   TRIG 79.0 12/17/2019   CHOLHDL 3 12/17/2019   A/P: Thankfully LDL has been at goal even with once a week statin-continue current medication  For aortic atherosclerosis-risk factors have been appropriately modified-continue to monitor   #hypertension/CKD stage III S: compliant with  metoprolol 12.5 mg extended release, amlodipine 2.10m - stopped hctz 12.529mdue to loewr BP and dizziness.   CKD stage III with creatinine ranging from 1.2-1.5 for the most part.  Night sweats in the past on ramipril and urine microalbumin to creatinine ratio has not been elevated so we have not restarted ACE inhibitor  BP Readings from Last 3 Encounters:   07/20/20 122/70  05/25/20 119/75  04/04/20 102/72   A/P: Blood pressure well controlled today-continue current medication  CKD has been stable- update today  #Dr. StFuller Planeviewed patient's prior colonoscopy being free of polyps-no further colonoscopy recommended -has had some stable to improving anemia  # Depression S: Medication:none  [facility] 07/20/2020 03/10/2020 02/17/2020  Decreased Interest 0 0 0  Down, Depressed, Hopeless 0 0 0  PHQ - 2 Score 0 0 0  Altered sleeping 0 0 0  Tired, decreased energy 0 0 0  Change in appetite 0 0 0  Feeling bad or failure about yourself  0 0 0  Trouble concentrating 0 0 0  Moving slowly or fidgety/restless 0 0 0  Suicidal thoughts 0 0 0  PHQ-9 Score 0 0 0  Difficult doing work/chores - Not difficult at all Not difficult at all  Some recent data might be hidden   A/P: full remission- continue to monitor  #b12 deficiency- has not been  taking b12 lately- we will update levels today  Recommended follow up: Return in about 4 months (around 11/17/2020) for follow up- or sooner if needed. Future Appointments  Date Time Provider [place]  02/08/2021  8:30 AM Cameron Sprang, MD [facility] None    Lab/Order associations:   ICD-10-CM   1. Essential hypertension  I10   2. Gastroesophageal reflux disease without esophagitis  K21.9   3. Hyperlipidemia associated with type 2 diabetes mellitus ([place])  E11.69    E78.5   4. Type 2 diabetes mellitus with chronic kidney disease, without long-term current use of insulin, unspecified CKD stage ([name])  E11.22 Hemoglobin A1c    Comprehensive metabolic panel    CBC with Differential/Platelet    Ambulatory referral to Ophthalmology  5. B12 deficiency  E53.8   6. Gout, unspecified cause, unspecified chronicity, unspecified site  M10.9   7. pT1pN0 colon cancer s/p robotic right colectomy 11/26/2017  C18.3   8. Prostate cancer ([place])  C61 PSA  9. Vascular dementia without behavioral disturbance  ([name])  F01.50   10. Major depressive disorder with single episode, in full remission ([place])  F32.5   11. AF (paroxysmal atrial fibrillation) ([name])  I48.0   12. Stage 3 chronic kidney disease, unspecified whether stage 3a or 3b CKD ([name])  N18.30   13. Aortic atherosclerosis ([name])  I70.0      No orders of the defined types were placed in this encounter.  Return precautions advised.  [name], MD

## 2020-07-20 ENCOUNTER — Other Ambulatory Visit: Payer: Self-pay

## 2020-07-20 ENCOUNTER — Ambulatory Visit (INDEPENDENT_AMBULATORY_CARE_PROVIDER_SITE_OTHER): Payer: PPO | Admitting: Family Medicine

## 2020-07-20 ENCOUNTER — Encounter: Payer: Self-pay | Admitting: Family Medicine

## 2020-07-20 VITALS — BP 122/70 | HR 51 | Temp 98.0°F | Resp 18 | Ht 70.0 in | Wt 155.8 lb

## 2020-07-20 DIAGNOSIS — E1122 Type 2 diabetes mellitus with diabetic chronic kidney disease: Secondary | ICD-10-CM

## 2020-07-20 DIAGNOSIS — N183 Chronic kidney disease, stage 3 unspecified: Secondary | ICD-10-CM

## 2020-07-20 DIAGNOSIS — E785 Hyperlipidemia, unspecified: Secondary | ICD-10-CM | POA: Diagnosis not present

## 2020-07-20 DIAGNOSIS — F0150 Vascular dementia without behavioral disturbance: Secondary | ICD-10-CM | POA: Diagnosis not present

## 2020-07-20 DIAGNOSIS — M109 Gout, unspecified: Secondary | ICD-10-CM

## 2020-07-20 DIAGNOSIS — C61 Malignant neoplasm of prostate: Secondary | ICD-10-CM | POA: Diagnosis not present

## 2020-07-20 DIAGNOSIS — C183 Malignant neoplasm of hepatic flexure: Secondary | ICD-10-CM | POA: Diagnosis not present

## 2020-07-20 DIAGNOSIS — K219 Gastro-esophageal reflux disease without esophagitis: Secondary | ICD-10-CM

## 2020-07-20 DIAGNOSIS — I480 Paroxysmal atrial fibrillation: Secondary | ICD-10-CM | POA: Diagnosis not present

## 2020-07-20 DIAGNOSIS — E538 Deficiency of other specified B group vitamins: Secondary | ICD-10-CM | POA: Diagnosis not present

## 2020-07-20 DIAGNOSIS — E1169 Type 2 diabetes mellitus with other specified complication: Secondary | ICD-10-CM

## 2020-07-20 DIAGNOSIS — F325 Major depressive disorder, single episode, in full remission: Secondary | ICD-10-CM

## 2020-07-20 DIAGNOSIS — I700 Atherosclerosis of aorta: Secondary | ICD-10-CM | POA: Diagnosis not present

## 2020-07-20 DIAGNOSIS — I10 Essential (primary) hypertension: Secondary | ICD-10-CM | POA: Diagnosis not present

## 2020-07-20 LAB — CBC WITH DIFFERENTIAL/PLATELET
Basophils Absolute: 0.1 10*3/uL (ref 0.0–0.1)
Basophils Relative: 0.8 % (ref 0.0–3.0)
Eosinophils Absolute: 0.3 10*3/uL (ref 0.0–0.7)
Eosinophils Relative: 4.7 % (ref 0.0–5.0)
HCT: 40.5 % (ref 39.0–52.0)
Hemoglobin: 13.8 g/dL (ref 13.0–17.0)
Lymphocytes Relative: 32.5 % (ref 12.0–46.0)
Lymphs Abs: 2 10*3/uL (ref 0.7–4.0)
MCHC: 34.1 g/dL (ref 30.0–36.0)
MCV: 91 fl (ref 78.0–100.0)
Monocytes Absolute: 0.4 10*3/uL (ref 0.1–1.0)
Monocytes Relative: 6.2 % (ref 3.0–12.0)
Neutro Abs: 3.4 10*3/uL (ref 1.4–7.7)
Neutrophils Relative %: 55.8 % (ref 43.0–77.0)
Platelets: 174 10*3/uL (ref 150.0–400.0)
RBC: 4.45 Mil/uL (ref 4.22–5.81)
RDW: 15.4 % (ref 11.5–15.5)
WBC: 6.1 10*3/uL (ref 4.0–10.5)

## 2020-07-20 LAB — COMPREHENSIVE METABOLIC PANEL
ALT: 16 U/L (ref 0–53)
AST: 21 U/L (ref 0–37)
Albumin: 4 g/dL (ref 3.5–5.2)
Alkaline Phosphatase: 114 U/L (ref 39–117)
BUN: 22 mg/dL (ref 6–23)
CO2: 29 mEq/L (ref 19–32)
Calcium: 8.8 mg/dL (ref 8.4–10.5)
Chloride: 102 mEq/L (ref 96–112)
Creatinine, Ser: 1.39 mg/dL (ref 0.40–1.50)
GFR: 46.51 mL/min — ABNORMAL LOW (ref >60.00)
Glucose, Bld: 119 mg/dL — ABNORMAL HIGH (ref 70–99)
Potassium: 3.5 mEq/L (ref 3.5–5.1)
Sodium: 139 mEq/L (ref 135–145)
Total Bilirubin: 0.7 mg/dL (ref 0.2–1.2)
Total Protein: 7.2 g/dL (ref 6.0–8.3)

## 2020-07-20 LAB — VITAMIN B12: Vitamin B-12: 214 pg/mL (ref 211–911)

## 2020-07-20 LAB — PSA: PSA: 0.01 ng/mL — ABNORMAL LOW (ref 0.10–4.00)

## 2020-07-20 LAB — HEMOGLOBIN A1C: Hgb A1c MFr Bld: 6 % (ref 4.6–6.5)

## 2020-09-16 IMAGING — CT CT CORE BIOPSY RENAL
1 of 2 series · 14 of 32 positions shown, 18 images · non-contrast
Comparison: none

CLINICAL DATA: Previous right partial colectomy November 2017,
pneumoperitoneum May 2018 and again [facility] common
now with worsening abdominal pain, CT demonstrating multiple
loculated fluid collections suggesting abscesses, large 7.1 cm left
lower quadrant.

[Series 2: i-spiral 5.0 b40f · axial · 0.78mm/px · z∈[+896,+1120]mm · 14 of 73 slices shown, 18 images]
[im 6/73  soft-tissue]
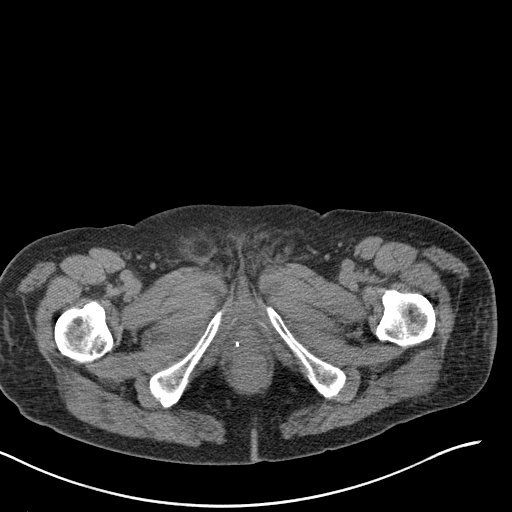
[im 6/73  bone]
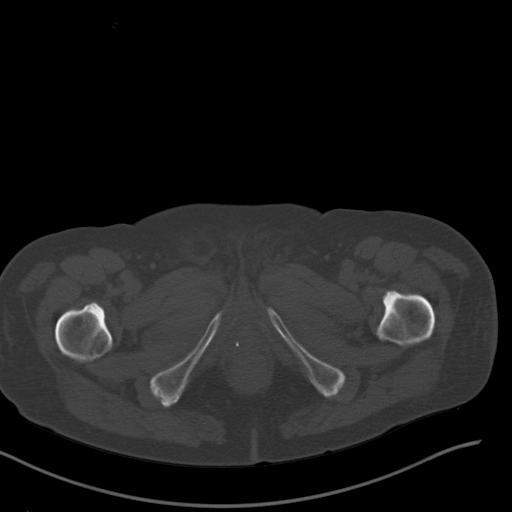
[im 12/73  soft-tissue]
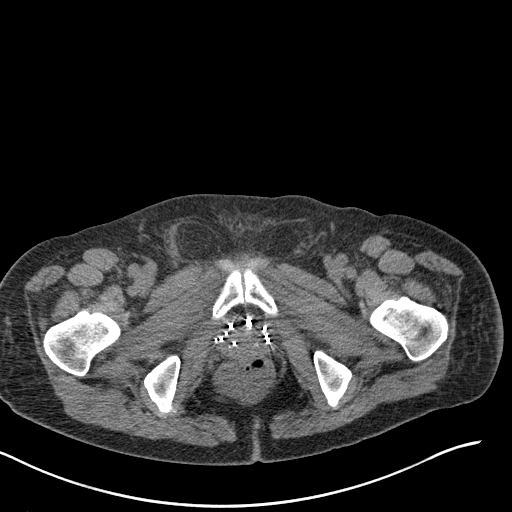
[im 17/73  soft-tissue]
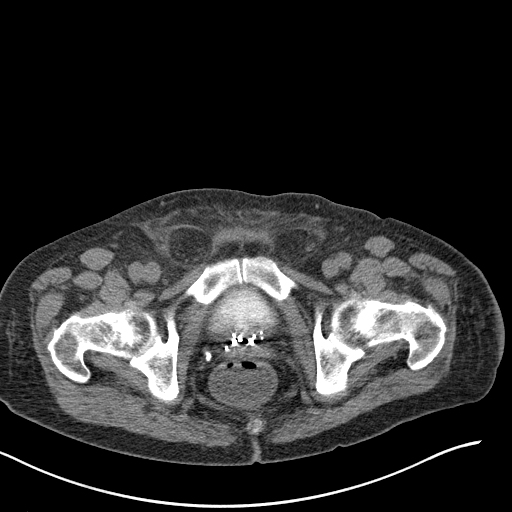
[im 23/73  soft-tissue]
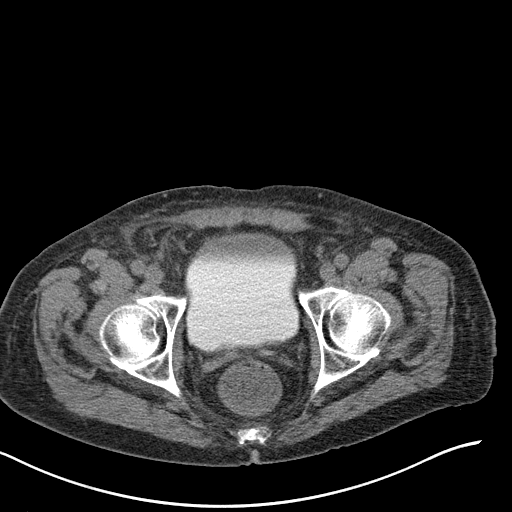
[im 28/73  soft-tissue]
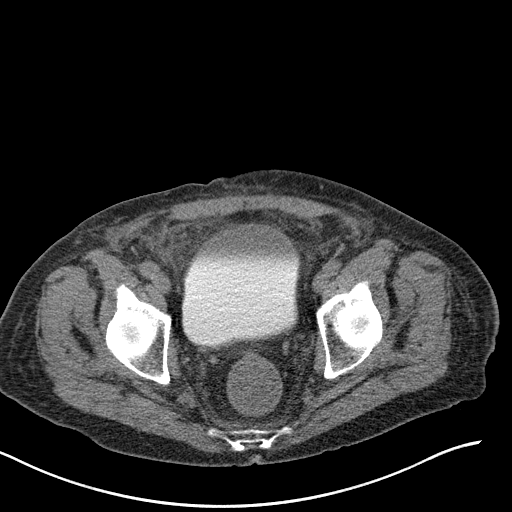
[im 34/73  soft-tissue]
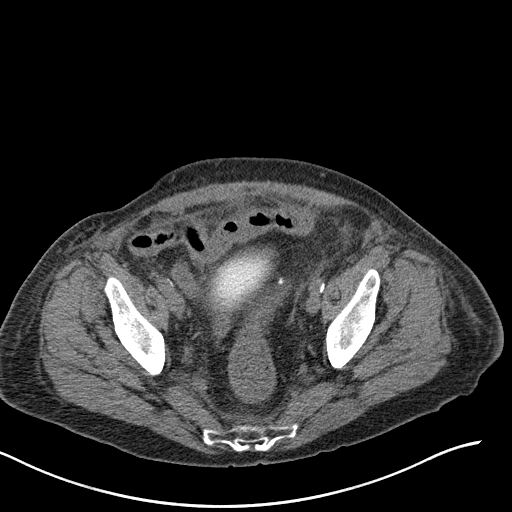
[im 39/73  soft-tissue]
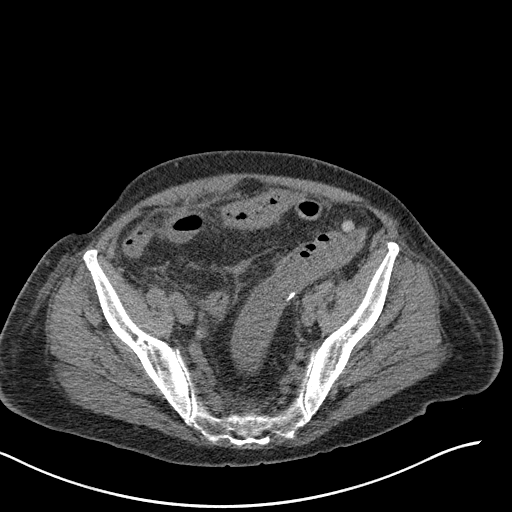
[im 45/73  soft-tissue]
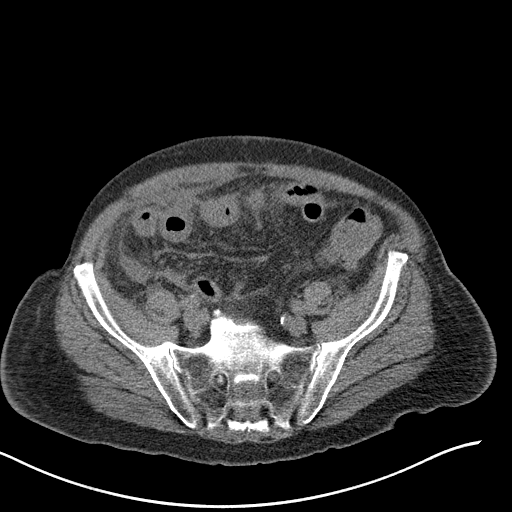
[im 50/73  soft-tissue]
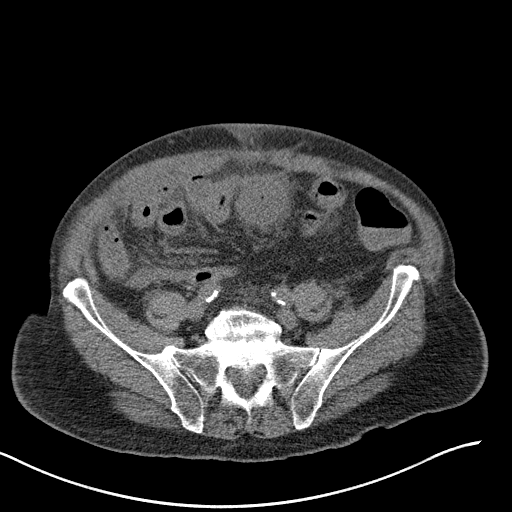
[im 50/73  bone]
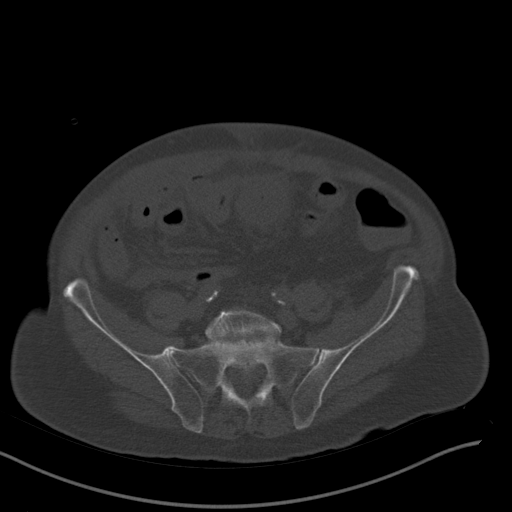
[im 56/73  soft-tissue]
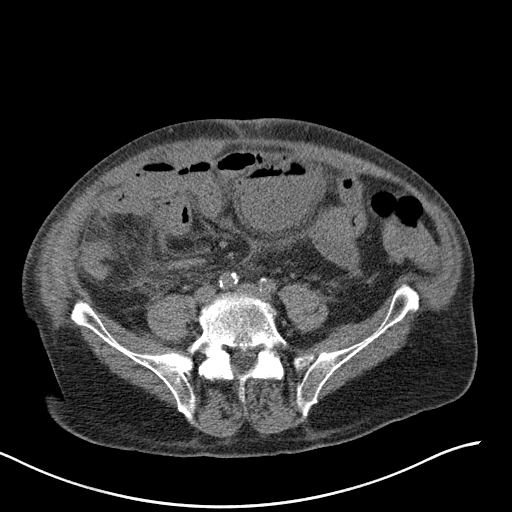
[im 61/73  soft-tissue]
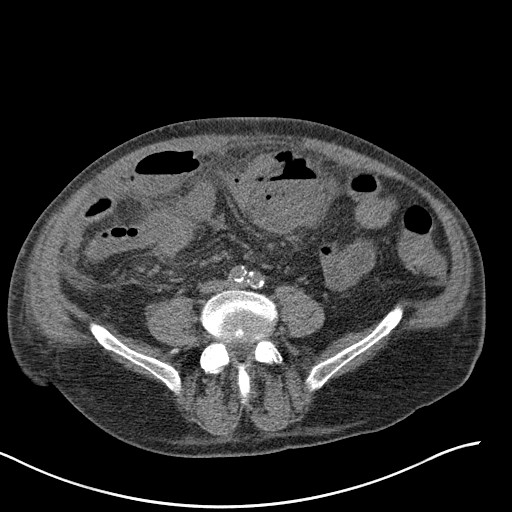
[im 61/73  lung]
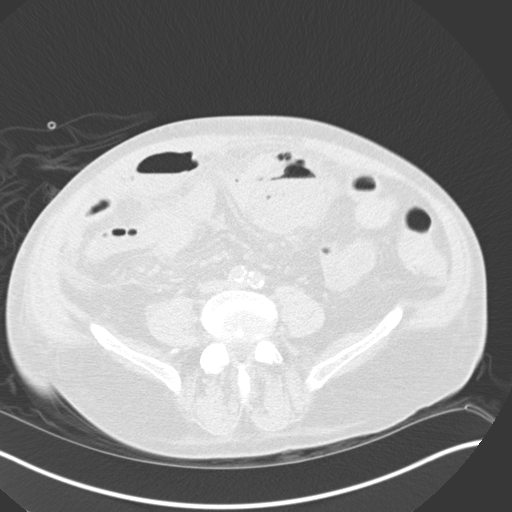
[im 64/73  lung]
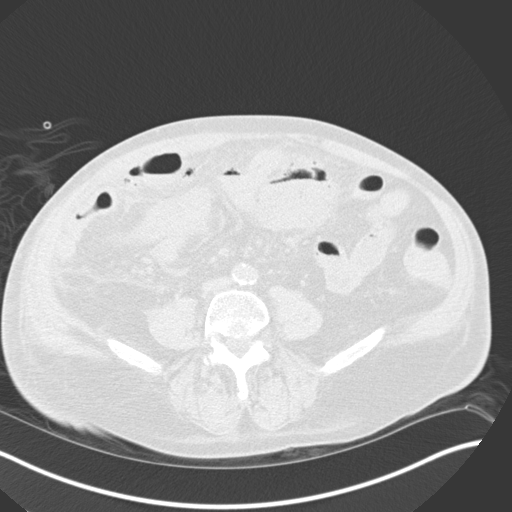
[im 67/73  soft-tissue]
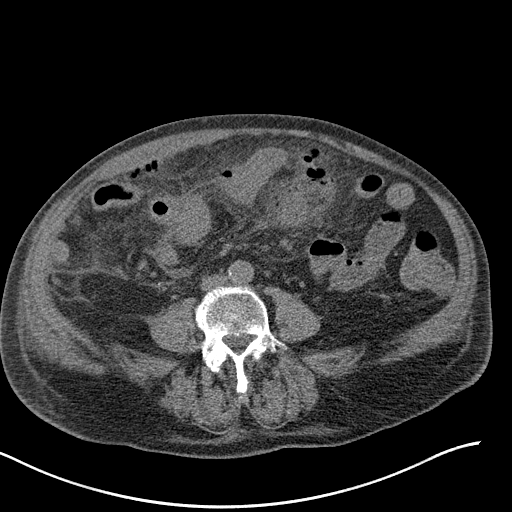
[im 67/73  lung]
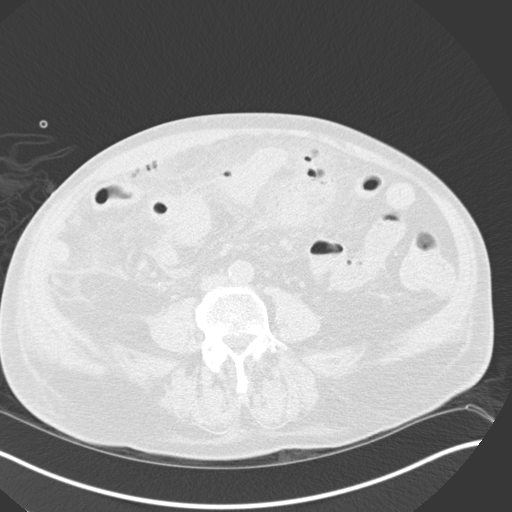
[im 70/73  lung]
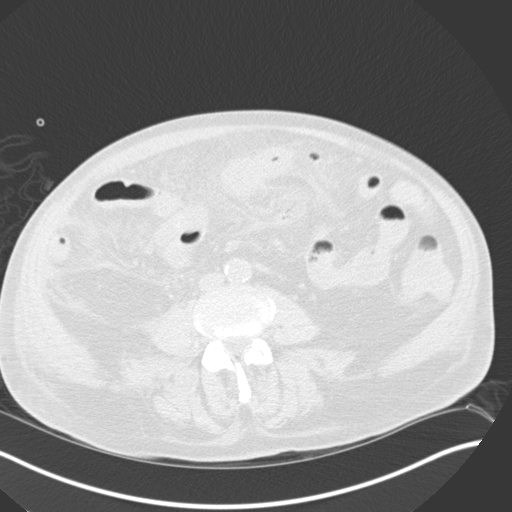

[14 of 32 positions shown; findings below may reference images not displayed]

EXAM:
CT GUIDED DRAINAGE OF PERITONEAL ABSCESS

ANESTHESIA/SEDATION:
Intravenous Fentanyl 02mcg and Versed 1.5mg were administered as
conscious sedation during continuous monitoring of the patient's
level of consciousness and physiological / cardiorespiratory status
by the radiology RN, with a total moderate sedation time of 11
minutes.

PROCEDURE:
The procedure, risks, benefits, and alternatives were explained to
the patient. Questions regarding the procedure were encouraged and
answered. The patient understands and consents to the procedure.

Select axial scans through the lower abdomen were obtained. The
dominant left lower quadrant collection was localized and an
appropriate skin entry site was determined and marked.

The operative field was prepped with chlorhexidinein a sterile
fashion, and a sterile drape was applied covering the operative
field. A sterile gown and sterile gloves were used for the
procedure. Local anesthesia was provided with 1% Lidocaine.

Under CT fluoroscopic guidance, 18 gauge trocar needle advanced into
the collection. Purulent material could be aspirated. Amplatz wire
advanced easily within the collection, its position confirmed on CT.
Tract dilated to facilitate placement of a 10 French pigtail drain
catheter, placed within the dependent aspect of the collection. 30
mL purulent material were aspirated, sent for Gram stain and
culture. Catheter position confirmed on CT. Catheter secured
externally with 0 Prolene suture and StatLock and placed to gravity
drain bag. The patient tolerated the procedure well.

COMPLICATIONS:
None immediate
FINDINGS: Left lower quadrant complex loculated collection was localized. 10
French pigtail drain catheter placed as above. Sample of the
aspirate sent for Gram stain and culture.
IMPRESSION: 1. Technically successful CT-guided left lower quadrant peritoneal
abscess drain catheter placement.

## 2020-09-19 IMAGING — DX DG CHEST 1V PORT
1 series · 1 of 1 positions shown · non-contrast
Comparison: One-view chest x-ray in/[DATE]

CLINICAL DATA: PICC line placement.

EXAM:
PORTABLE CHEST 1 VIEW

[chest ap]
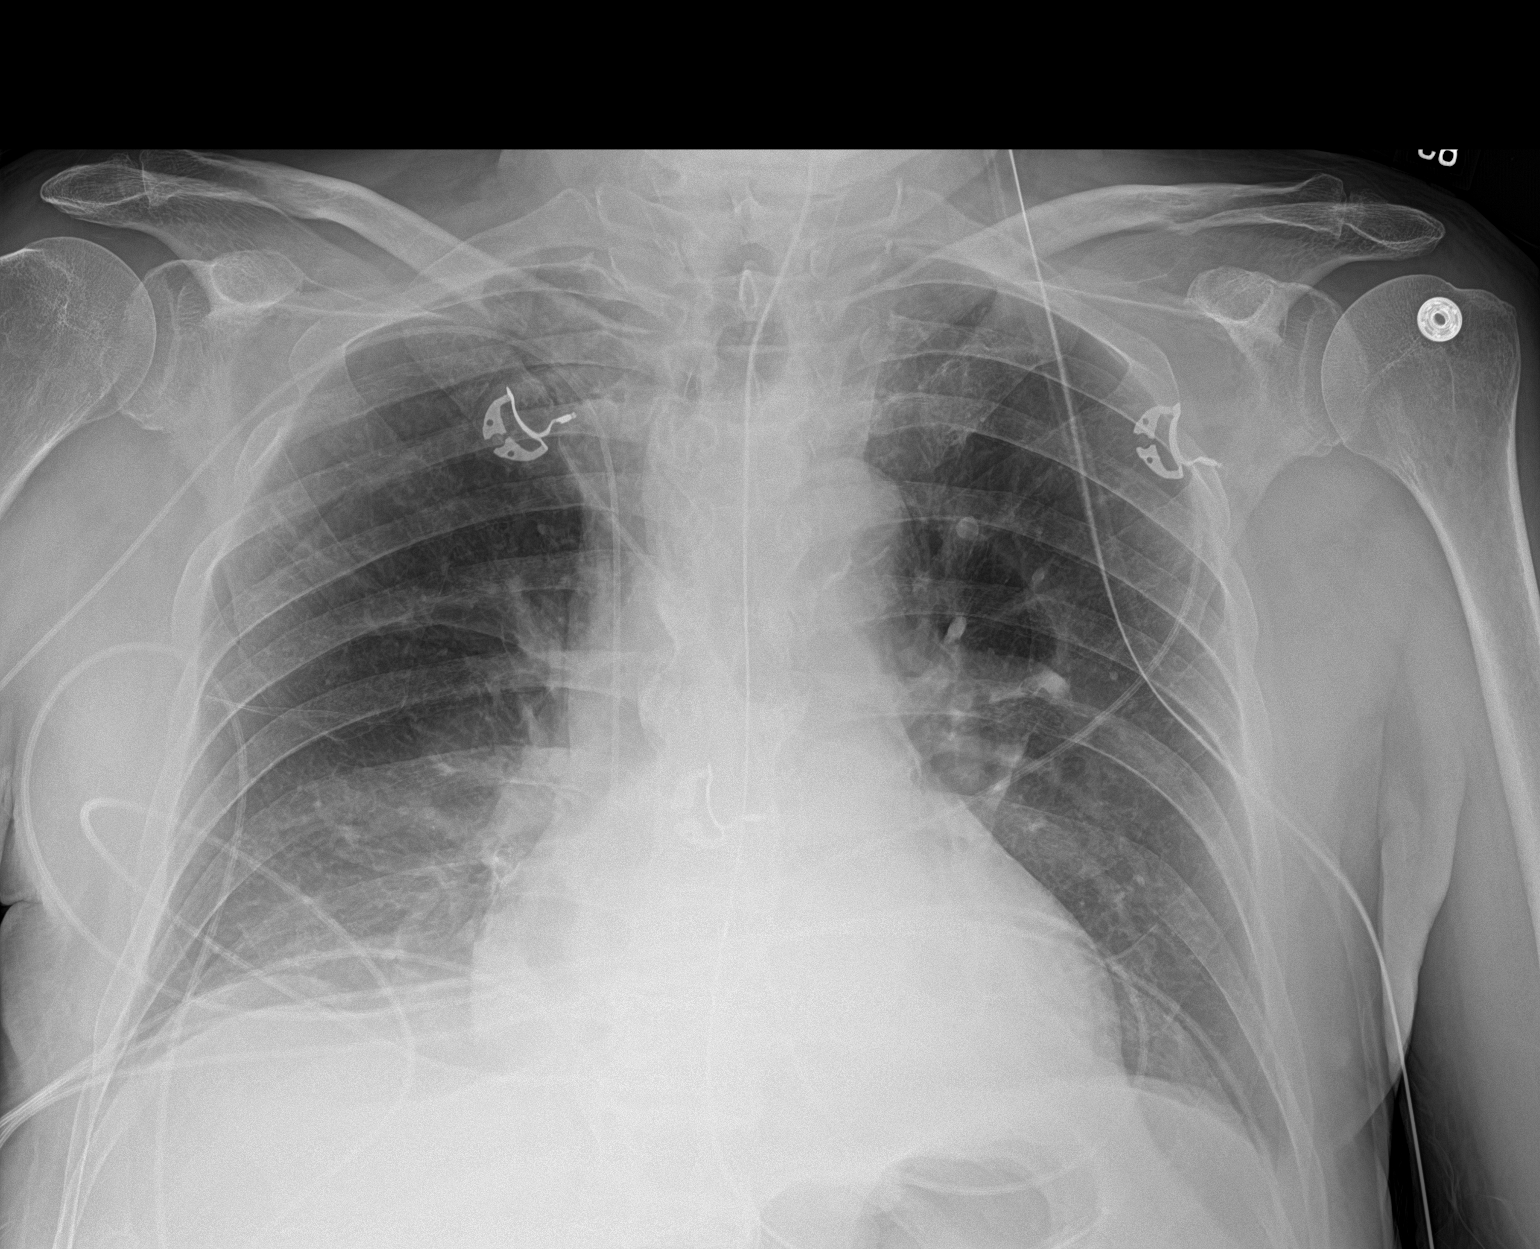

[1 of 1 positions shown; findings below may reference images not displayed]

FINDINGS: A right-sided PICC line is in place. The tip is at the cavoatrial
junction.

Heart size is upper limits of normal. Atherosclerotic changes are
noted at the aortic arch. NG tube courses off the inferior border
the film.

New lower lobe airspace disease is present bilaterally, right
greater than left. Lung volumes are low.
IMPRESSION: 1. Satisfactory positioning of right sided PICC line at the
cavoatrial junction.
2. New bilateral lower lobe airspace disease, right greater than
left. While this may reflect atelectasis, infection is not excluded.
3. Aortic atherosclerosis.

## 2020-09-20 IMAGING — DX DG ABD PORTABLE 1V
1 series · 1 of 1 positions shown · non-contrast
Comparison: April 16, 2019.

CLINICAL DATA: Nasogastric tube placement.

EXAM:
PORTABLE ABDOMEN - 1 VIEW

[abdomen kub]
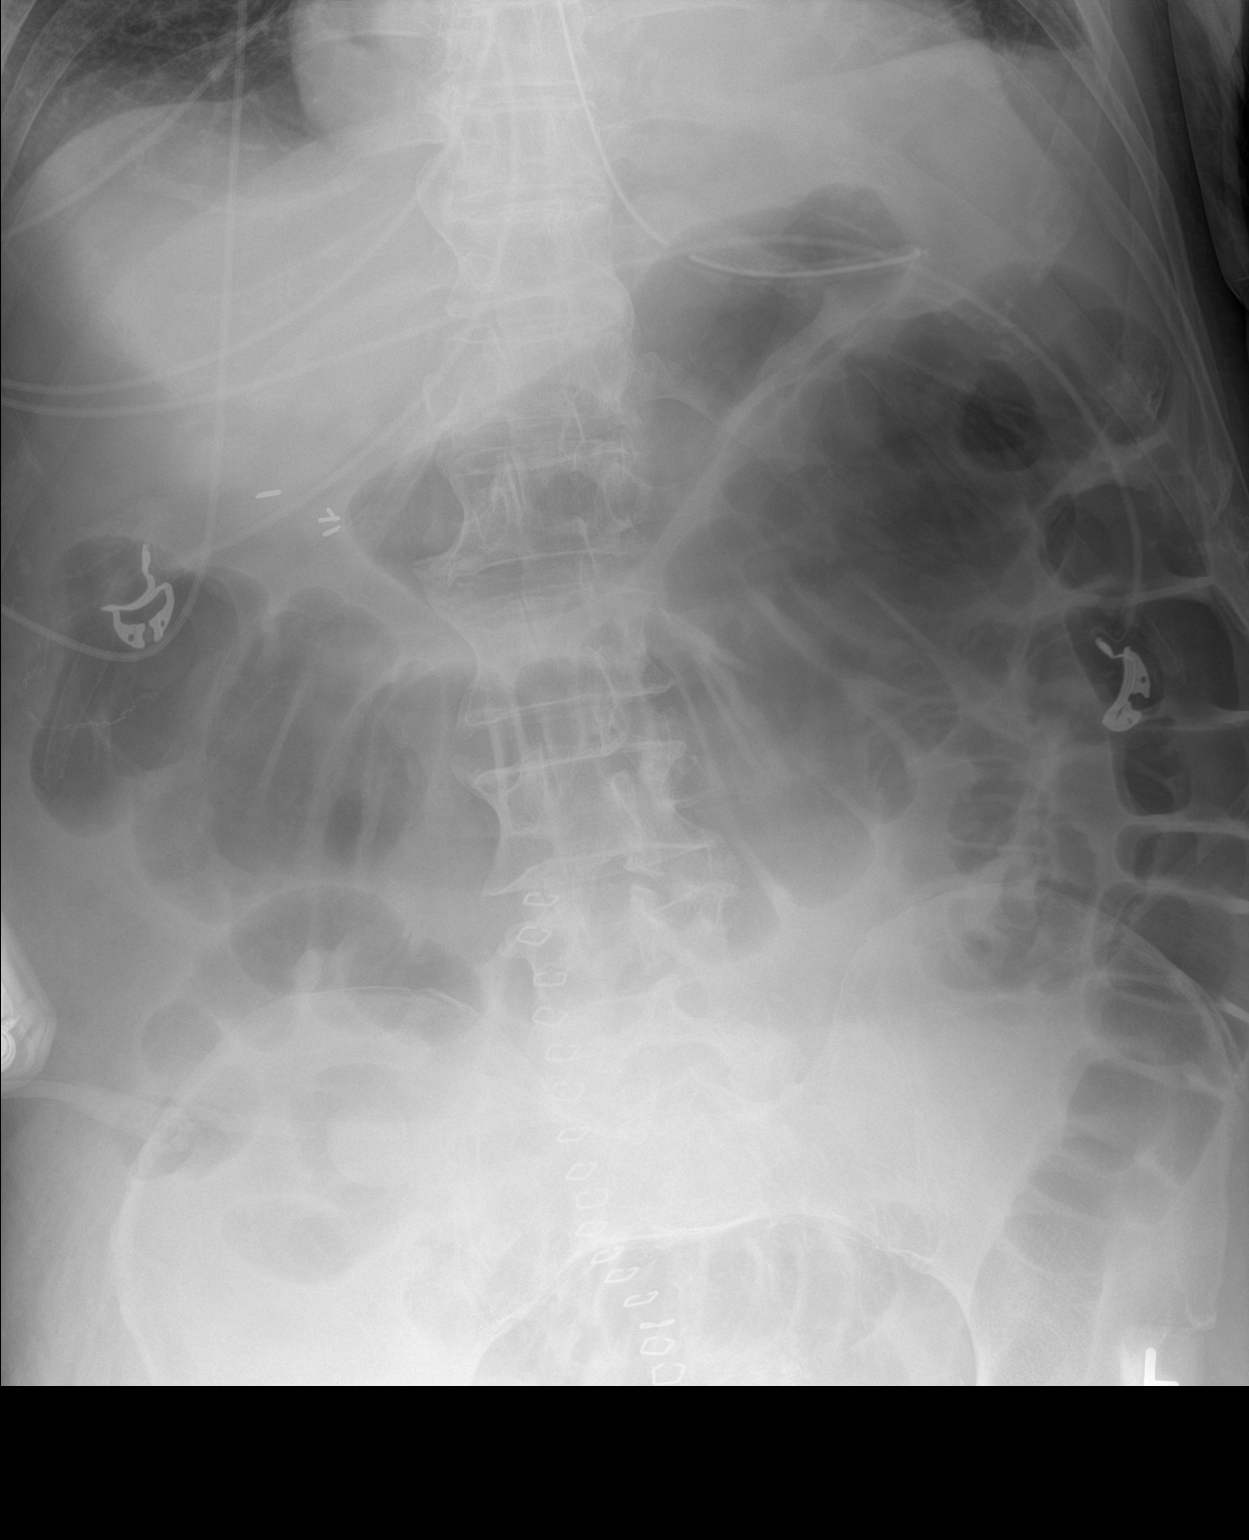

[1 of 1 positions shown; findings below may reference images not displayed]

FINDINGS: Distal tip of nasogastric tube is seen in proximal stomach. Status
post cholecystectomy. Dilated air-filled colon is noted. Mildly
dilated small bowel loops are noted. Midline surgical staples are
noted. No abnormal calcifications are noted.
IMPRESSION: Distal tip of nasogastric tube seen in proximal stomach. Mildly
dilated large and small bowel loops are noted concerning for ileus
or distal small bowel obstruction.

## 2020-09-22 IMAGING — DX DG ABD PORTABLE 1V
1 series · 1 of 1 positions shown · non-contrast
Comparison: Radiograph 04/29/2019

CLINICAL DATA: NG tube placement.

EXAM:
PORTABLE ABDOMEN - 1 VIEW

[abdomen]
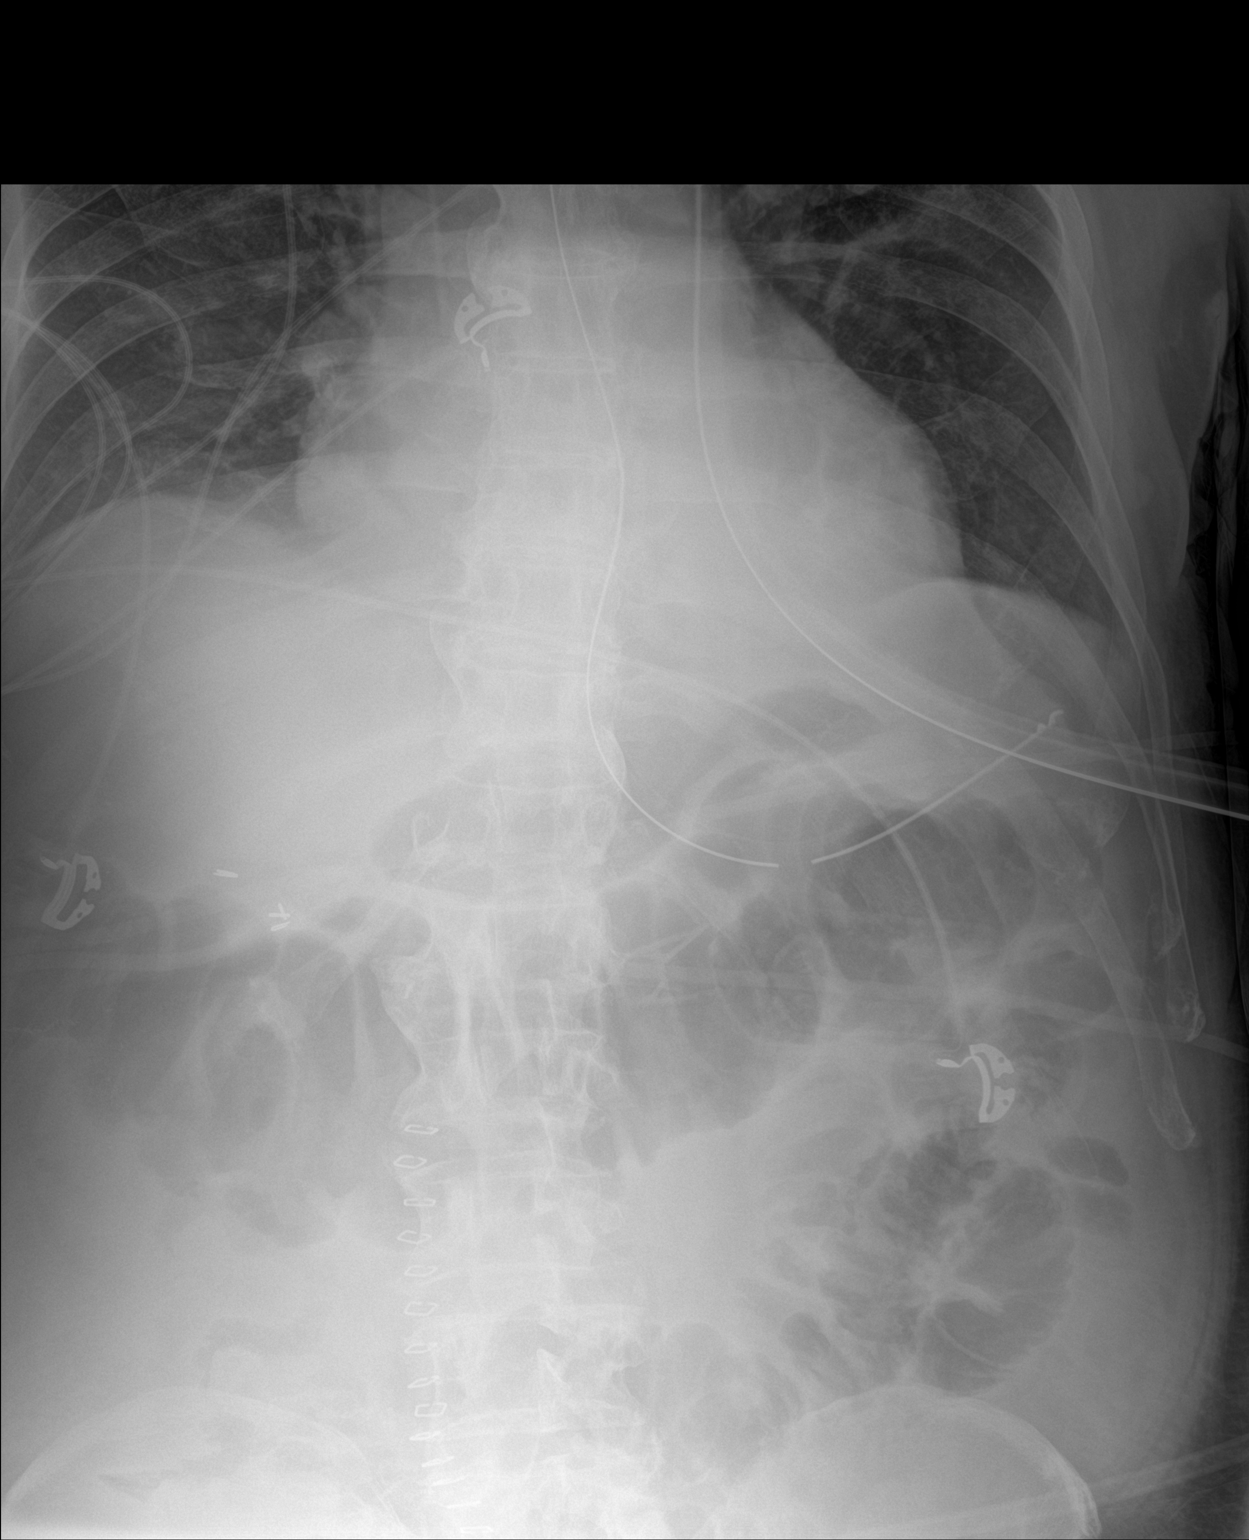

[1 of 1 positions shown; findings below may reference images not displayed]

FINDINGS: Tip and side port of the enteric tube below the diaphragm in the
stomach. Persistent but decreasing gaseous distention of colon in
the central abdomen. Prominent air-filled small bowel loops in the
left abdomen. Midline skin staples in place.
IMPRESSION: 1. Tip and side port of the enteric tube below the diaphragm in the
stomach.
2. Persistent but decreasing gaseous distention of colon in the
central abdomen.

## 2020-10-02 ENCOUNTER — Telehealth: Payer: Self-pay | Admitting: Family Medicine

## 2020-10-02 NOTE — Progress Notes (Signed)
  Chronic Care Management   Outreach Note  10/02/2020 Name: STAR CHEESE MRN: [id number] DOB: 1934-06-12  Referred by: Marin Olp, MD Reason for referral : No chief complaint on file.   An unsuccessful telephone outreach was attempted today. The patient was referred to the pharmacist for assistance with care management and care coordination.   Follow Up Plan:   Lauretta Grill Upstream Scheduler

## 2020-10-09 ENCOUNTER — Telehealth: Payer: Self-pay | Admitting: Family Medicine

## 2020-10-09 NOTE — Chronic Care Management (AMB) (Signed)
  Chronic Care Management   Outreach Note  10/09/2020 Name: Calvin Burnett MRN: [id number] DOB: December 31, 1933  Referred by: Marin Olp, MD Reason for referral : No chief complaint on file.   A second unsuccessful telephone outreach was attempted today. The patient was referred to pharmacist for assistance with care management and care coordination.  Follow Up Plan:   Lauretta Grill Upstream Scheduler

## 2020-10-19 ENCOUNTER — Other Ambulatory Visit: Payer: Self-pay | Admitting: Family Medicine

## 2020-10-19 ENCOUNTER — Telehealth: Payer: Self-pay | Admitting: Family Medicine

## 2020-10-19 NOTE — Chronic Care Management (AMB) (Signed)
  Chronic Care Management   Outreach Note  10/19/2020 Name: Calvin Burnett MRN: [id number] DOB: 1934-03-26  Referred by: Marin Olp, MD Reason for referral : No chief complaint on file.   Third unsuccessful telephone outreach was attempted today. The patient was referred to the pharmacist for assistance with care management and care coordination.   Follow Up Plan:   Lauretta Grill Upstream Scheduler

## 2020-11-10 ENCOUNTER — Other Ambulatory Visit: Payer: Self-pay | Admitting: Family Medicine

## 2020-11-22 NOTE — Patient Instructions (Addendum)
  Health Maintenance Due  Topic Date Due  . OPHTHALMOLOGY EXAM Sign a release form at check out. Patient states he has an appointment scheduled.  04/07/2019  . COVID-19 Vaccine (4 - Booster for [employer] series) Patient states that he will get this scheduled.  08/01/2020   Please stop by lab before you go If you have mychart- we will send your results within 3 business days of [place] receiving them.  If you do not have mychart- we will call you about results within 5 business days of [place] receiving them.  *please also note that you will see labs on mychart as soon as they post. I will later go in and write notes on them- will say "notes from [name]"   Recommended follow up: Return in about 4 months (around 03/26/2021) for physical or sooner if needed.

## 2020-11-23 ENCOUNTER — Encounter: Payer: Self-pay | Admitting: Family Medicine

## 2020-11-23 ENCOUNTER — Ambulatory Visit (INDEPENDENT_AMBULATORY_CARE_PROVIDER_SITE_OTHER): Payer: PPO | Admitting: Family Medicine

## 2020-11-23 ENCOUNTER — Other Ambulatory Visit: Payer: Self-pay

## 2020-11-23 VITALS — BP 116/82 | HR 44 | Temp 98.2°F | Ht 70.0 in | Wt 159.6 lb

## 2020-11-23 DIAGNOSIS — E1122 Type 2 diabetes mellitus with diabetic chronic kidney disease: Secondary | ICD-10-CM | POA: Diagnosis not present

## 2020-11-23 DIAGNOSIS — E538 Deficiency of other specified B group vitamins: Secondary | ICD-10-CM

## 2020-11-23 DIAGNOSIS — M109 Gout, unspecified: Secondary | ICD-10-CM

## 2020-11-23 DIAGNOSIS — E1169 Type 2 diabetes mellitus with other specified complication: Secondary | ICD-10-CM

## 2020-11-23 DIAGNOSIS — I10 Essential (primary) hypertension: Secondary | ICD-10-CM

## 2020-11-23 DIAGNOSIS — N183 Chronic kidney disease, stage 3 unspecified: Secondary | ICD-10-CM | POA: Diagnosis not present

## 2020-11-23 DIAGNOSIS — E785 Hyperlipidemia, unspecified: Secondary | ICD-10-CM

## 2020-11-23 LAB — COMPREHENSIVE METABOLIC PANEL
ALT: 22 U/L (ref 0–53)
AST: 26 U/L (ref 0–37)
Albumin: 4 g/dL (ref 3.5–5.2)
Alkaline Phosphatase: 110 U/L (ref 39–117)
BUN: 23 mg/dL (ref 6–23)
CO2: 29 mEq/L (ref 19–32)
Calcium: 9 mg/dL (ref 8.4–10.5)
Chloride: 104 mEq/L (ref 96–112)
Creatinine, Ser: 1.38 mg/dL (ref 0.40–1.50)
GFR: 46.8 mL/min — ABNORMAL LOW (ref >60.00)
Glucose, Bld: 129 mg/dL — ABNORMAL HIGH (ref 70–99)
Potassium: 4.1 mEq/L (ref 3.5–5.1)
Sodium: 140 mEq/L (ref 135–145)
Total Bilirubin: 0.7 mg/dL (ref 0.2–1.2)
Total Protein: 7.3 g/dL (ref 6.0–8.3)

## 2020-11-23 LAB — HEMOGLOBIN A1C: Hgb A1c MFr Bld: 6.7 % — ABNORMAL HIGH (ref 4.6–6.5)

## 2020-11-23 LAB — MICROALBUMIN / CREATININE URINE RATIO
Creatinine,U: 100.3 mg/dL
Microalb Creat Ratio: 21.4 mg/g (ref 0.0–30.0)
Microalb, Ur: 21.5 mg/dL — ABNORMAL HIGH (ref 0.0–1.9)

## 2020-11-23 LAB — LDL CHOLESTEROL, DIRECT: Direct LDL: 57 mg/dL

## 2020-11-23 NOTE — Progress Notes (Signed)
Phone [phone] In person visit   Subjective:   Calvin Burnett is a 87 y.o. year old very pleasant male patient who presents for/with See problem oriented charting Chief Complaint  Patient presents with  . Hypertension  . Hyperlipidemia  . Diabetes  . Depression   This visit occurred during the SARS-CoV-2 public health emergency.  Safety protocols were in place, including screening questions prior to the visit, additional usage of staff PPE, and extensive cleaning of exam room while observing appropriate contact time as indicated for disinfecting solutions.   Past Medical History-  Patient Active Problem List   Diagnosis Date Noted  . Intra-abdominal abscess ([place]) 04/24/2019    Priority: High  . B12 deficiency 12/04/2017    Priority: High  . pT1pN0 colon cancer s/p robotic right colectomy 11/26/2017 10/02/2017    Priority: High  . AF (paroxysmal atrial fibrillation) ([place]) 04/09/2017    Priority: High  . Night sweats 09/16/2016    Priority: High  . Major depressive disorder with single episode, in full remission ([place]) 07/29/2016    Priority: High  . Rectal pain 10/14/2014    Priority: High  . Prostate cancer ([place]) 08/12/2012    Priority: High  . Type 2 diabetes mellitus ([place]) 07/31/2010    Priority: High  . Vascular dementia ([place]) 07/27/2008    Priority: High  . BPPV (benign paroxysmal positional vertigo) 12/17/2019    Priority: Medium  . History of adenomatous polyp of colon 10/27/2014    Priority: Medium  . Syncope 09/27/2014    Priority: Medium  . CKD (chronic kidney disease), stage III ([place]) 06/01/2014    Priority: Medium  . Hyperlipidemia associated with type 2 diabetes mellitus ([place]) 03/01/2014    Priority: Medium  . Basal cell carcinoma 05/03/2009    Priority: Medium  . Essential hypertension 01/30/2007    Priority: Medium  . Aortic atherosclerosis ([place]) 01/29/2019    Priority: Low  . Stricture of sigmoid s/p robotic sigmoidectomy 11/26/2017  11/26/2017    Priority: Low  . Chronic anticoagulation 11/26/2017    Priority: Low  . Right inguinal hernia 10/02/2017    Priority: Low  . Bowel habit changes 10/14/2014    Priority: Low  . Bradycardia 09/27/2014    Priority: Low  . LEG CRAMPS, NOCTURNAL 04/30/2010    Priority: Low  . Cervical spondylosis without myelopathy 12/11/2007    Priority: Low  . GERD (gastroesophageal reflux disease) 11/05/2007    Priority: Low  . Gout 10/28/2019    Medications- reviewed and updated Current Outpatient Medications  Medication Sig Dispense Refill  . amLODipine (NORVASC) 2.5 MG tablet TAKE 1 TABLET BY MOUTH EVERY DAY 90 tablet 3  . apixaban (ELIQUIS) 2.5 MG TABS tablet Take 1 tablet (2.5 mg total) by mouth 2 (two) times daily. 180 tablet 3  . blood glucose meter kit and supplies KIT Dispense based on patient and insurance preference. Use up to four times daily as directed. Dx E11.9 1 each 0  . cyanocobalamin 1000 MCG tablet Take 1,000 mcg by mouth daily.    Marland Kitchen latanoprost (XALATAN) 0.005 % ophthalmic solution Place 1 drop into both eyes at bedtime.   12  . memantine (NAMENDA XR) 28 MG CP24 24 hr capsule Take 1 capsule (28 mg total) by mouth daily. 90 capsule 3  . metFORMIN (GLUCOPHAGE-XR) 500 MG 24 hr tablet TAKE 1 TABLET BY MOUTH EVERY DAY WITH BREAKFAST 30 tablet 5  . metoprolol succinate (TOPROL-XL) 25 MG 24 hr tablet TAKE  1/2 TABLET BY MOUTH EVERY DAY 45 tablet 1  . rosuvastatin (CRESTOR) 20 MG tablet TAKE 1 TABLET (20 MG TOTAL) BY MOUTH ONCE A WEEK. 13 tablet 3   No current facility-administered medications for this visit.     Objective:  BP 116/82   Pulse (!) 44   Temp 98.2 F (36.8 C) (Temporal)   Ht 5\' 10"  (1.778 m)   Wt 159 lb 9.6 oz (72.4 kg)   SpO2 97%   BMI 22.90 kg/m  Gen: NAD, resting comfortably CV: RRR no murmurs rubs or gallops Lungs: CTAB no crackles, wheeze, rhonchi Abdomen: soft/nontender/nondistended/normal bowel sounds. No rebound or guarding.  Ext: no  edema Skin: warm, dry   Diabetic Foot Exam - Simple   Simple Foot Form Diabetic Foot exam was performed with the following findings: Yes   Visual Inspection No deformities, no ulcerations, no other skin breakdown bilaterally: Yes Sensation Testing Intact to touch and monofilament testing bilaterally: Yes Pulse Check Posterior Tibialis and Dorsalis pulse intact bilaterally: Yes Comments        Assessment and Plan   #Vascular dementia- Dr. Delice Lesch S: Patient is compliant with memantine/Namenda.  Patient has been on memantine for many years. last visit with Dr. Delice Lesch 05/25/20 and moca score 20/30.  A/P: largely stable recently- daughter and patient states he is doing reasonably well- continue current meds and continue to monitor/neuro follow up    #Atrial fibrillation S: Patient is anticoagulated with Eliquis 2.5 mg twice daily as well as rate controlled with metoprolol 12.5 mg extended release A/P:  Stable. Continue current medications.     # Diabetes S: Compliant with Metformin 500 mg daily with breakfast. His daughter reports that he has lost weight and she has some concerns.  Exercise and diet- He walks 1 mile daily, but he used to walk 3 miles.   Lab Results  Component Value Date   HGBA1C 6.0 07/20/2020   HGBA1C 6.6 (H) 12/17/2019   HGBA1C 7.0 (H) 09/15/2019  A/P: Good control last visit-if A1c goes any lower I may consider stopping metformin    #hyperlipidemia/history of stroke-may have been atrial fibrillation related S: compliant with rosuvastatin 20 mg, also on Eliquis-not on aspirin as stroke thought to be related to atrial fibrillation A/P: LDL goal under 70-update direct LDL today   #hypertension/CKD stage III S: compliant with metoprolol 12.5 mg extended release, amlodipine 2.5mg  - stopped hctz 12.5mg  due to lower BP and dizziness.  Hydrochlorothiazide was on medication list today for some reason-we remove this  CKD stage III with creatinine ranging from  1.2-1.5 for the most part.  Night sweats in the past on ramipril and urine microalbumin to creatinine ratio has not been elevated so we have not restarted ACE inhibitor  BP Readings from Last 3 Encounters:  11/23/20 116/82  07/20/20 122/70  05/25/20 119/75   A/P: For hypertension -stabe conitnue medications CKD stage III- Will update CMP    #B12 low normal delivery 2022- He is taking his oral B12 as recommended-I asked team Elleni Mozingo to add this back to the medication list  Recommended follow TK:PTWSFK in about 4 months (around 03/26/2021) for physical or sooner if needed. Future Appointments  Date Time Provider [place]  02/08/2021  8:30 AM Cameron Sprang, MD [facility] None    Lab/Order associations:   ICD-10-CM   1. Essential hypertension  I10   2. Type 2 diabetes mellitus with chronic kidney disease, without long-term current use of insulin, unspecified CKD stage ([place])  E11.22 Microalbumin / creatinine urine ratio    Hemoglobin A1c    Comprehensive metabolic panel  3. Hyperlipidemia associated with type 2 diabetes mellitus ([name])  E11.69 LDL cholesterol, direct   E78.5   4. Stage 3 chronic kidney disease, unspecified whether stage 3a or 3b CKD ([name])  N18.30   5. B12 deficiency  E53.8    I,Alexis Bryant,acting as a scribe for [name], MD.,have documented all relevant documentation on the behalf of [name], MD,as directed by  [name], MD while in the presence of [name], MD.   I, [name], MD, have reviewed all documentation for this visit. The documentation on 11/23/20 for the exam, diagnosis, procedures, and orders are all accurate and complete.   Return precautions advised.  [name], MD

## 2020-11-23 NOTE — Addendum Note (Signed)
Addended by: Brandy Hale on: 11/23/2020 10:17 AM   Modules accepted: Orders

## 2020-12-07 ENCOUNTER — Other Ambulatory Visit: Payer: Self-pay | Admitting: Family Medicine

## 2020-12-13 ENCOUNTER — Other Ambulatory Visit: Payer: Self-pay | Admitting: Family Medicine

## 2021-01-05 ENCOUNTER — Other Ambulatory Visit: Payer: Self-pay | Admitting: Family Medicine

## 2021-01-29 ENCOUNTER — Telehealth: Payer: Self-pay | Admitting: *Deleted

## 2021-01-29 NOTE — Telephone Encounter (Signed)
Pt presented to the office c/o dizziness and swelling in lower extremities x 1 week. Denies chest pain or SOB, no blurred vision. Pt said he was in [place] when symptoms started. Bp: 140/80, pulse: 54, O2: 97.  Discussed pt with Dr. Jerline Pain said he needed to be seen today or tomorrow for evaluation.  Told pt Dr. Jerline Pain said he wants you to be seen. IAlso do not take blood pressure medicine Amlodipine today. I was able to get you an appt scheduled for tomorrow at 1:30 [facility] office with Maximiano Coss, NP. Directions given to pt too. Pt verbalized understanding. Told pt to drink plenty of water today and elevate legs. Pt verbalized understanding.

## 2021-01-30 ENCOUNTER — Encounter: Payer: Self-pay | Admitting: Registered Nurse

## 2021-01-30 ENCOUNTER — Ambulatory Visit (INDEPENDENT_AMBULATORY_CARE_PROVIDER_SITE_OTHER): Payer: PPO | Admitting: Registered Nurse

## 2021-01-30 ENCOUNTER — Other Ambulatory Visit: Payer: Self-pay

## 2021-01-30 VITALS — BP 141/67 | HR 100 | Temp 98.2°F | Resp 18 | Ht 70.0 in | Wt 159.0 lb

## 2021-01-30 DIAGNOSIS — R350 Frequency of micturition: Secondary | ICD-10-CM

## 2021-01-30 DIAGNOSIS — I499 Cardiac arrhythmia, unspecified: Secondary | ICD-10-CM | POA: Diagnosis not present

## 2021-01-30 NOTE — Patient Instructions (Addendum)
Mr. Calvin Burnett -   Stop taking amlodipine - I think this is making your blood pressure go a little low and causing some dizziness.  Labs today will check for damage to heart tissue and potential for a clot. I will call you with any urgent results.   I will communicate with Drs. Hunter and Crenshaw to let them know what's happening and to coordinate any follow up if they would like  Thank you  Calvin Burnett     If you have lab work done today you will be contacted with your lab results within the next 2 weeks.  If you have not heard from [place] then please contact us. The fastest way to get your results is to register for My Chart.   IF you received an x-ray today, you will receive an invoice from [facility]. Please contact [facility] at [phone] with questions or concerns regarding your invoice.   IF you received labwork today, you will receive an invoice from [facility]. Please contact [facility] at [phone] with questions or concerns regarding your invoice.   Our billing staff will not be able to assist you with questions regarding bills from these companies.  You will be contacted with the lab results as soon as they are available. The fastest way to get your results is to activate your My Chart account. Instructions are located on the last page of this paperwork. If you have not heard from [place] regarding the results in 2 weeks, please contact this office.
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
## 2021-01-31 LAB — URINALYSIS, ROUTINE W REFLEX MICROSCOPIC
Bilirubin Urine: NEGATIVE
Ketones, ur: NEGATIVE
Leukocytes,Ua: NEGATIVE
Nitrite: NEGATIVE
Specific Gravity, Urine: 1.02 (ref 1.000–1.030)
Total Protein, Urine: 30 — AB
Urine Glucose: NEGATIVE
Urobilinogen, UA: 0.2 (ref 0.0–1.0)
pH: 5.5 (ref 5.0–8.0)

## 2021-01-31 LAB — CBC WITH DIFFERENTIAL/PLATELET
Basophils Absolute: 0.1 10*3/uL (ref 0.0–0.1)
Basophils Relative: 1 % (ref 0.0–3.0)
Eosinophils Absolute: 0.2 10*3/uL (ref 0.0–0.7)
Eosinophils Relative: 2.8 % (ref 0.0–5.0)
HCT: 42.1 % (ref 39.0–52.0)
Hemoglobin: 14.4 g/dL (ref 13.0–17.0)
Lymphocytes Relative: 30.5 % (ref 12.0–46.0)
Lymphs Abs: 1.9 10*3/uL (ref 0.7–4.0)
MCHC: 34.2 g/dL (ref 30.0–36.0)
MCV: 91 fl (ref 78.0–100.0)
Monocytes Absolute: 0.5 10*3/uL (ref 0.1–1.0)
Monocytes Relative: 7.9 % (ref 3.0–12.0)
Neutro Abs: 3.6 10*3/uL (ref 1.4–7.7)
Neutrophils Relative %: 57.8 % (ref 43.0–77.0)
Platelets: 171 10*3/uL (ref 150.0–400.0)
RBC: 4.62 Mil/uL (ref 4.22–5.81)
RDW: 15.8 % — ABNORMAL HIGH (ref 11.5–15.5)
WBC: 6.2 10*3/uL (ref 4.0–10.5)

## 2021-01-31 LAB — COMPREHENSIVE METABOLIC PANEL
ALT: 22 U/L (ref 0–53)
AST: 29 U/L (ref 0–37)
Albumin: 4.3 g/dL (ref 3.5–5.2)
Alkaline Phosphatase: 101 U/L (ref 39–117)
BUN: 22 mg/dL (ref 6–23)
CO2: 29 mEq/L (ref 19–32)
Calcium: 9.3 mg/dL (ref 8.4–10.5)
Chloride: 101 mEq/L (ref 96–112)
Creatinine, Ser: 1.43 mg/dL (ref 0.40–1.50)
GFR: 44.78 mL/min — ABNORMAL LOW (ref >60.00)
Glucose, Bld: 94 mg/dL (ref 70–99)
Potassium: 3.8 mEq/L (ref 3.5–5.1)
Sodium: 140 mEq/L (ref 135–145)
Total Bilirubin: 0.9 mg/dL (ref 0.2–1.2)
Total Protein: 7.8 g/dL (ref 6.0–8.3)

## 2021-01-31 LAB — D-DIMER, QUANTITATIVE: D-Dimer, Quant: 0.59 mcg/mL FEU — ABNORMAL HIGH (ref <0.50)

## 2021-01-31 LAB — BRAIN NATRIURETIC PEPTIDE: Pro B Natriuretic peptide (BNP): 161 pg/mL — ABNORMAL HIGH (ref 0.0–100.0)

## 2021-02-08 ENCOUNTER — Other Ambulatory Visit: Payer: Self-pay

## 2021-02-08 ENCOUNTER — Ambulatory Visit (INDEPENDENT_AMBULATORY_CARE_PROVIDER_SITE_OTHER): Payer: PPO | Admitting: Neurology

## 2021-02-08 ENCOUNTER — Encounter: Payer: Self-pay | Admitting: Neurology

## 2021-02-08 VITALS — BP 144/78 | HR 71 | Resp 18 | Ht 70.0 in | Wt 159.0 lb

## 2021-02-08 DIAGNOSIS — G3184 Mild cognitive impairment, so stated: Secondary | ICD-10-CM | POA: Diagnosis not present

## 2021-02-08 NOTE — Patient Instructions (Signed)
Good to see you!! Continue Namenda XR '28mg'$  daily. Follow-up in 6 months, call for any changes   FALL PRECAUTIONS: Be cautious when walking. Scan the area for obstacles that may increase the risk of trips and falls. When getting up in the mornings, sit up at the edge of the bed for a few minutes before getting out of bed. Consider elevating the bed at the head end to avoid drop of blood pressure when getting up. Walk always in a well-lit room (use night lights in the walls). Avoid area rugs or power cords from appliances in the middle of the walkways. Use a walker or a cane if necessary and consider physical therapy for balance exercise. Get your eyesight checked regularly.  FINANCIAL OVERSIGHT: Supervision, especially oversight when making financial decisions or transactions is also recommended as difficulties arise.  HOME SAFETY: Consider the safety of the kitchen when operating appliances like stoves, microwave oven, and blender. Consider having supervision and share cooking responsibilities until no longer able to participate in those. Accidents with firearms and other hazards in the house should be identified and addressed as well.  DRIVING: Regarding driving, in patients with progressive memory problems, driving will be impaired. We advise to have someone else do the driving if trouble finding directions or if minor accidents are reported. Independent driving assessment is available to determine safety of driving.  ABILITY TO BE LEFT ALONE: If patient is unable to contact 911 operator, consider using LifeLine, or when the need is there, arrange for someone to stay with patients. Smoking is a fire hazard, consider supervision or cessation. Risk of wandering should be assessed by caregiver and if detected at any point, supervision and safe proof recommendations should be instituted.  MEDICATION SUPERVISION: Inability to self-administer medication needs to be constantly addressed. Implement a  mechanism to ensure safe administration of the medications.  RECOMMENDATIONS FOR ALL PATIENTS WITH MEMORY PROBLEMS: 1. Continue to exercise (Recommend 30 minutes of walking everyday, or 3 hours every week) 2. Increase social interactions - continue going to [place] and enjoy social gatherings with friends and family 3. Eat healthy, avoid fried foods and eat more fruits and vegetables 4. Maintain adequate blood pressure, blood sugar, and blood cholesterol level. Reducing the risk of stroke and cardiovascular disease also helps promoting better memory. 5. Avoid stressful situations. Live a simple life and avoid aggravations. Organize your time and prepare for the next day in anticipation. 6. Sleep well, avoid any interruptions of sleep and avoid any distractions in the bedroom that may interfere with adequate sleep quality 7. Avoid sugar, avoid sweets as there is a strong link between excessive sugar intake, diabetes, and cognitive impairment We discussed the Mediterranean diet, which has been shown to help patients reduce the risk of progressive memory disorders and reduces cardiovascular risk. This includes eating fish, eat fruits and green leafy vegetables, nuts like almonds and hazelnuts, walnuts, and also use olive oil. Avoid fast foods and fried foods as much as possible. Avoid sweets and sugar as sugar use has been linked to worsening of memory function.  There is always a concern of gradual progression of memory problems. If this is the case, then we may need to adjust level of care according to patient needs. Support, both to the patient and caregiver, should then be put into place.
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
## 2021-03-28 ENCOUNTER — Encounter: Payer: Self-pay | Admitting: Dermatology

## 2021-03-28 ENCOUNTER — Ambulatory Visit: Payer: PPO | Admitting: Dermatology

## 2021-03-28 ENCOUNTER — Other Ambulatory Visit: Payer: Self-pay

## 2021-03-28 DIAGNOSIS — C44719 Basal cell carcinoma of skin of left lower limb, including hip: Secondary | ICD-10-CM

## 2021-03-28 DIAGNOSIS — Z1283 Encounter for screening for malignant neoplasm of skin: Secondary | ICD-10-CM | POA: Diagnosis not present

## 2021-03-28 DIAGNOSIS — C44319 Basal cell carcinoma of skin of other parts of face: Secondary | ICD-10-CM

## 2021-03-28 DIAGNOSIS — C4491 Basal cell carcinoma of skin, unspecified: Secondary | ICD-10-CM

## 2021-03-28 DIAGNOSIS — L821 Other seborrheic keratosis: Secondary | ICD-10-CM | POA: Diagnosis not present

## 2021-03-28 DIAGNOSIS — D0439 Carcinoma in situ of skin of other parts of face: Secondary | ICD-10-CM

## 2021-03-28 DIAGNOSIS — D1800 Hemangioma unspecified site: Secondary | ICD-10-CM

## 2021-03-28 DIAGNOSIS — D485 Neoplasm of uncertain behavior of skin: Secondary | ICD-10-CM

## 2021-03-28 DIAGNOSIS — Z85828 Personal history of other malignant neoplasm of skin: Secondary | ICD-10-CM

## 2021-03-28 DIAGNOSIS — C4492 Squamous cell carcinoma of skin, unspecified: Secondary | ICD-10-CM

## 2021-03-28 HISTORY — DX: Squamous cell carcinoma of skin, unspecified: C44.92

## 2021-03-28 HISTORY — DX: Basal cell carcinoma of skin, unspecified: C44.91

## 2021-03-28 NOTE — Patient Instructions (Signed)

## 2021-04-03 ENCOUNTER — Telehealth: Payer: Self-pay

## 2021-04-03 NOTE — Telephone Encounter (Signed)
-----   Message from [name], MD sent at 04/03/2021 12:49 PM EDT ----- Four skin cancers, likely require several treatments which I will discuss with Mr. Calvin Burnett at his surgical appointment follow-up.

## 2021-04-03 NOTE — Telephone Encounter (Signed)
Phone call to patient with his pathology result. Voicemail left for patient to give the office a call back.  

## 2021-04-05 ENCOUNTER — Ambulatory Visit (INDEPENDENT_AMBULATORY_CARE_PROVIDER_SITE_OTHER): Payer: PPO | Admitting: Family Medicine

## 2021-04-05 ENCOUNTER — Other Ambulatory Visit: Payer: Self-pay

## 2021-04-05 ENCOUNTER — Encounter: Payer: Self-pay | Admitting: Family Medicine

## 2021-04-05 VITALS — BP 126/80 | HR 46 | Temp 98.0°F | Ht 70.0 in | Wt 160.8 lb

## 2021-04-05 DIAGNOSIS — E1169 Type 2 diabetes mellitus with other specified complication: Secondary | ICD-10-CM | POA: Diagnosis not present

## 2021-04-05 DIAGNOSIS — N183 Chronic kidney disease, stage 3 unspecified: Secondary | ICD-10-CM

## 2021-04-05 DIAGNOSIS — I10 Essential (primary) hypertension: Secondary | ICD-10-CM | POA: Diagnosis not present

## 2021-04-05 DIAGNOSIS — I350 Nonrheumatic aortic (valve) stenosis: Secondary | ICD-10-CM | POA: Diagnosis not present

## 2021-04-05 DIAGNOSIS — Z0000 Encounter for general adult medical examination without abnormal findings: Secondary | ICD-10-CM | POA: Diagnosis not present

## 2021-04-05 DIAGNOSIS — E1122 Type 2 diabetes mellitus with diabetic chronic kidney disease: Secondary | ICD-10-CM | POA: Diagnosis not present

## 2021-04-05 DIAGNOSIS — Z23 Encounter for immunization: Secondary | ICD-10-CM | POA: Diagnosis not present

## 2021-04-05 DIAGNOSIS — E785 Hyperlipidemia, unspecified: Secondary | ICD-10-CM | POA: Diagnosis not present

## 2021-04-05 DIAGNOSIS — Z79899 Other long term (current) drug therapy: Secondary | ICD-10-CM | POA: Diagnosis not present

## 2021-04-05 LAB — CBC WITH DIFFERENTIAL/PLATELET
Basophils Absolute: 0 10*3/uL (ref 0.0–0.1)
Basophils Relative: 0.7 % (ref 0.0–3.0)
Eosinophils Absolute: 0.2 10*3/uL (ref 0.0–0.7)
Eosinophils Relative: 2.7 % (ref 0.0–5.0)
HCT: 42.7 % (ref 39.0–52.0)
Hemoglobin: 14.4 g/dL (ref 13.0–17.0)
Lymphocytes Relative: 30.2 % (ref 12.0–46.0)
Lymphs Abs: 1.9 10*3/uL (ref 0.7–4.0)
MCHC: 33.6 g/dL (ref 30.0–36.0)
MCV: 94.9 fl (ref 78.0–100.0)
Monocytes Absolute: 0.5 10*3/uL (ref 0.1–1.0)
Monocytes Relative: 7.6 % (ref 3.0–12.0)
Neutro Abs: 3.8 10*3/uL (ref 1.4–7.7)
Neutrophils Relative %: 58.8 % (ref 43.0–77.0)
Platelets: 160 10*3/uL (ref 150.0–400.0)
RBC: 4.5 Mil/uL (ref 4.22–5.81)
RDW: 14.5 % (ref 11.5–15.5)
WBC: 6.4 10*3/uL (ref 4.0–10.5)

## 2021-04-05 LAB — COMPREHENSIVE METABOLIC PANEL
ALT: 30 U/L (ref 0–53)
AST: 32 U/L (ref 0–37)
Albumin: 4.1 g/dL (ref 3.5–5.2)
Alkaline Phosphatase: 120 U/L — ABNORMAL HIGH (ref 39–117)
BUN: 23 mg/dL (ref 6–23)
CO2: 30 mEq/L (ref 19–32)
Calcium: 9.4 mg/dL (ref 8.4–10.5)
Chloride: 103 mEq/L (ref 96–112)
Creatinine, Ser: 1.58 mg/dL — ABNORMAL HIGH (ref 0.40–1.50)
GFR: 39.68 mL/min — ABNORMAL LOW (ref >60.00)
Glucose, Bld: 121 mg/dL — ABNORMAL HIGH (ref 70–99)
Potassium: 4.5 mEq/L (ref 3.5–5.1)
Sodium: 140 mEq/L (ref 135–145)
Total Bilirubin: 0.8 mg/dL (ref 0.2–1.2)
Total Protein: 7.5 g/dL (ref 6.0–8.3)

## 2021-04-05 LAB — LIPID PANEL
Cholesterol: 113 mg/dL (ref 0–200)
HDL: 37.7 mg/dL — ABNORMAL LOW (ref >39.00)
LDL Cholesterol: 57 mg/dL (ref 0–99)
NonHDL: 75.27
Total CHOL/HDL Ratio: 3
Triglycerides: 91 mg/dL (ref 0.0–149.0)
VLDL: 18.2 mg/dL (ref 0.0–40.0)

## 2021-04-05 LAB — VITAMIN B12: Vitamin B-12: 886 pg/mL (ref 211–911)

## 2021-04-05 LAB — HEMOGLOBIN A1C: Hgb A1c MFr Bld: 6.6 % — ABNORMAL HIGH (ref 4.6–6.5)

## 2021-04-05 MED ORDER — APIXABAN 2.5 MG PO TABS: 2.5000 mg | ORAL_TABLET | Freq: Two times a day (BID) | ORAL | 3 refills | Status: DC

## 2021-04-05 MED ORDER — ROSUVASTATIN CALCIUM 20 MG PO TABS: 20.0000 mg | ORAL_TABLET | ORAL | 3 refills | Status: DC

## 2021-04-05 NOTE — Telephone Encounter (Signed)
-----   Message from [name], MD sent at 04/03/2021 12:49 PM EDT ----- Four skin cancers, likely require several treatments which I will discuss with Mr. Hazelip at his surgical appointment follow-up.

## 2021-04-05 NOTE — Telephone Encounter (Signed)
Left message to call needs ov

## 2021-04-05 NOTE — Patient Instructions (Addendum)
3Health Maintenance Due  Topic Date Due   OPHTHALMOLOGY EXAM   Sign release of information at the check out desk for last diabetic eye exam - we will try to get a copy of this.  04/07/2019   COVID-19 Vaccine (4 - Booster for [employer] series)  Recommend getting Omicron variant specific booster only at your local pharmacy! Please let us know the date of when you have received this.  07/24/2020   INFLUENZA VACCINE   High Dose Flu shot done today.   02/12/2021   Please stop by lab before you go If you have mychart- we will send your results within 3 business days of [place] receiving them.  If you do not have mychart- we will call you about results within 5 business days of [place] receiving them.  *please also note that you will see labs on mychart as soon as they post. I will later go in and write notes on them- will say "notes from [name]"  If your scabs/lesions worsen over time, please schedule a follow-up with your dermatologist - for the time being, I suggest trying to put Vaseline over those areas before bed to help lubricate them. Could try in morning as well if not improving  Please call and schedule a visit with your dentist.  Recommended follow up: Return in about 4 months (around 08/05/2021) for a follow-up or sooner if needed.
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
## 2021-04-09 ENCOUNTER — Encounter: Payer: Self-pay | Admitting: Dermatology

## 2021-04-09 NOTE — Progress Notes (Signed)
Follow-Up Visit   Subjective  Calvin Burnett is a 87 y.o. male who presents for the following: Annual Exam (Scaly/rough spots on nose & left lower leg x 20months- think they are healing).  Several new nonhealing spots on face and left leg.  History of nonmelanoma skin cancer. Location:  Duration:  Quality:  Associated Signs/Symptoms: Modifying Factors:  Severity:  Timing: Context:   Objective  Well appearing patient in no apparent distress; mood and affect are within normal limits. Torso - Posterior (Back) General skin examination, no atypical pigmented lesions suspicious for melanoma.  Left Upper Back Brown textured flattopped 6 mm papule; dermoscopy typical  Chest - Medial (Center) Multiple smooth 1 to 4 mm red dermal papules  Left Lower Leg - Anterior Centrally eroded pearly pink-brown 8 mm crust       Left Temple Pearly 1 cm nodule       Mid Tip of Nose 6 mm pink crust, rule out superficial carcinoma       Right Ala Nasi 5 mm pink crust rule out superficial carcinoma       Left Lower Leg - Anterior Patient states that site of [facility] 2019 is stable; examination does show some crusting at upper margin of hyperpigmented scar.    A full examination was performed including scalp, head, eyes, ears, nose, lips, neck, chest, axillae, abdomen, back, buttocks, bilateral upper extremities, bilateral lower extremities, hands, feet, fingers, toes, fingernails, and toenails. All findings within normal limits unless otherwise noted below.  Areas beneath undergarments not fully examined.   Assessment & Plan    Encounter for screening for malignant neoplasm of skin Torso - Posterior (Back)  Annual skin examination.  Seborrheic keratosis Left Upper Back  May leave if stable  Hemangioma, unspecified site Chest - Medial (Center)  No intervention necessary  Neoplasm of uncertain behavior of skin (4) Left Lower Leg - Anterior  Skin / nail  biopsy Type of biopsy: tangential   Informed consent: discussed and consent obtained   Timeout: patient name, date of birth, surgical site, and procedure verified   Procedure prep:  Patient was prepped and draped in usual sterile fashion (Non sterile) Prep type:  Chlorhexidine Anesthesia: the lesion was anesthetized in a standard fashion   Anesthetic:  1% lidocaine w/ epinephrine 1-100,000 local infiltration Instrument used: flexible razor blade   Outcome: patient tolerated procedure well   Post-procedure details: wound care instructions given    Specimen 1 - Surgical pathology Differential Diagnosis: bcc vs scc  Check Margins: No  Left Temple  Skin / nail biopsy Type of biopsy: tangential   Informed consent: discussed and consent obtained   Timeout: patient name, date of birth, surgical site, and procedure verified   Procedure prep:  Patient was prepped and draped in usual sterile fashion (Non sterile) Prep type:  Chlorhexidine Anesthesia: the lesion was anesthetized in a standard fashion   Anesthetic:  1% lidocaine w/ epinephrine 1-100,000 local infiltration Instrument used: flexible razor blade   Outcome: patient tolerated procedure well   Post-procedure details: wound care instructions given    Specimen 2 - Surgical pathology Differential Diagnosis: bcc vs scc  Check Margins: No  Mid Tip of Nose  Skin / nail biopsy Type of biopsy: tangential   Informed consent: discussed and consent obtained   Timeout: patient name, date of birth, surgical site, and procedure verified   Procedure prep:  Patient was prepped and draped in usual sterile fashion (Non sterile) Prep type:  Chlorhexidine Anesthesia:  the lesion was anesthetized in a standard fashion   Anesthetic:  1% lidocaine w/ epinephrine 1-100,000 local infiltration Instrument used: flexible razor blade   Outcome: patient tolerated procedure well   Post-procedure details: wound care instructions given    Specimen 3 -  Surgical pathology Differential Diagnosis: bcc vs scc  Check Margins: No  Right Ala Nasi  Skin / nail biopsy Type of biopsy: tangential   Informed consent: discussed and consent obtained   Timeout: patient name, date of birth, surgical site, and procedure verified   Procedure prep:  Patient was prepped and draped in usual sterile fashion (Non sterile) Prep type:  Chlorhexidine Anesthesia: the lesion was anesthetized in a standard fashion   Anesthetic:  1% lidocaine w/ epinephrine 1-100,000 local infiltration Instrument used: flexible razor blade   Outcome: patient tolerated procedure well   Post-procedure details: wound care instructions given    Specimen 4 - Surgical pathology Differential Diagnosis: bcc vs scc  Check Margins: No  Personal history of skin cancer Left Lower Leg - Anterior  For now we will continue to observe as long as it is historically stable.      I, [name], MD, have reviewed all documentation for this visit.  The documentation on 04/09/21 for the exam, diagnosis, procedures, and orders are all accurate and complete.

## 2021-04-12 ENCOUNTER — Telehealth: Payer: Self-pay

## 2021-04-12 NOTE — Telephone Encounter (Signed)
-----   Message from [name], MD sent at 04/03/2021 12:49 PM EDT ----- Four skin cancers, likely require several treatments which I will discuss with Mr. Gudino at his surgical appointment follow-up.

## 2021-04-12 NOTE — Telephone Encounter (Signed)
Path to patient 10/3 work in to get started on the many scc and bcc

## 2021-04-16 ENCOUNTER — Ambulatory Visit (INDEPENDENT_AMBULATORY_CARE_PROVIDER_SITE_OTHER): Payer: PPO | Admitting: Dermatology

## 2021-04-16 ENCOUNTER — Encounter: Payer: Self-pay | Admitting: Dermatology

## 2021-04-16 ENCOUNTER — Other Ambulatory Visit: Payer: Self-pay

## 2021-04-16 DIAGNOSIS — D0439 Carcinoma in situ of skin of other parts of face: Secondary | ICD-10-CM

## 2021-04-16 DIAGNOSIS — D099 Carcinoma in situ, unspecified: Secondary | ICD-10-CM

## 2021-04-16 NOTE — Patient Instructions (Signed)

## 2021-05-01 ENCOUNTER — Encounter: Payer: Self-pay | Admitting: Dermatology

## 2021-05-01 NOTE — Progress Notes (Signed)
   Follow-Up Visit   Subjective  Calvin Burnett is a 87 y.o. male who presents for the following: Procedure (Bcc x2 scc x2 ).  Multiple nonmelanoma skin cancers, biopsy-proven, we will treat to nasal lesions today Location:  Duration:  Quality:  Associated Signs/Symptoms: Modifying Factors:  Severity:  Timing: Context:   Objective  Well appearing patient in no apparent distress; mood and affect are within normal limits. Right Ala Nasi Lesion identified by Dr.Bleu Moisan and nurse in room.    Mid Tip of Nose Lesion identified by Dr.Braelin Brosch and nurse in room.      A focused examination was performed including head and neck. Relevant physical exam findings are noted in the Assessment and Plan.   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  Destruction of lesion Complexity: simple   Destruction method: electrodesiccation and curettage   Informed consent: discussed and consent obtained   Timeout:  patient name, date of birth, surgical site, and procedure verified Anesthesia: the lesion was anesthetized in a standard fashion   Anesthetic:  1% lidocaine w/ epinephrine 1-100,000 local infiltration Curettage performed in three different directions: Yes   Curettage cycles:  3 Lesion length (cm):  1 Lesion width (cm):  1 Margin per side (cm):  0 Final wound size (cm):  1 Hemostasis achieved with:  ferric subsulfate Outcome: patient tolerated procedure well with no complications   Post-procedure details: sterile dressing applied and wound care instructions given   Dressing type: bandage and petrolatum   Additional details:  Wound innoculated with 5 fluorouracil solution.  Mid Tip of Nose  Destruction of lesion Complexity: simple   Destruction method: electrodesiccation and curettage   Informed consent: discussed and consent obtained   Timeout:  patient name, date of birth, surgical site, and procedure verified Anesthesia: the lesion was  anesthetized in a standard fashion   Anesthetic:  1% lidocaine w/ epinephrine 1-100,000 local infiltration Curettage performed in three different directions: Yes   Curettage cycles:  3 Lesion length (cm):  1.1 Lesion width (cm):  1.1 Margin per side (cm):  0 Final wound size (cm):  1.1 Outcome: patient tolerated procedure well with no complications   Post-procedure details: sterile dressing applied and wound care instructions given   Dressing type: bandage and petrolatum   Additional details:  Wound inoculated with 5% fluorouracil solution      I, [name], MD, have reviewed all documentation for this visit.  The documentation on 05/01/21 for the exam, diagnosis, procedures, and orders are all accurate and complete.

## 2021-05-21 ENCOUNTER — Ambulatory Visit: Payer: PPO | Admitting: Dermatology

## 2021-05-23 DIAGNOSIS — H18523 Epithelial (juvenile) corneal dystrophy, bilateral: Secondary | ICD-10-CM | POA: Diagnosis not present

## 2021-05-23 DIAGNOSIS — H0288A Meibomian gland dysfunction right eye, upper and lower eyelids: Secondary | ICD-10-CM | POA: Diagnosis not present

## 2021-05-23 DIAGNOSIS — Z961 Presence of intraocular lens: Secondary | ICD-10-CM | POA: Diagnosis not present

## 2021-05-23 DIAGNOSIS — H401132 Primary open-angle glaucoma, bilateral, moderate stage: Secondary | ICD-10-CM | POA: Diagnosis not present

## 2021-05-23 DIAGNOSIS — H0288B Meibomian gland dysfunction left eye, upper and lower eyelids: Secondary | ICD-10-CM | POA: Diagnosis not present

## 2021-05-23 LAB — HM DIABETES EYE EXAM

## 2021-05-24 ENCOUNTER — Ambulatory Visit (INDEPENDENT_AMBULATORY_CARE_PROVIDER_SITE_OTHER): Payer: PPO | Admitting: Dermatology

## 2021-05-24 ENCOUNTER — Other Ambulatory Visit: Payer: Self-pay

## 2021-05-24 DIAGNOSIS — C44719 Basal cell carcinoma of skin of left lower limb, including hip: Secondary | ICD-10-CM | POA: Diagnosis not present

## 2021-05-24 DIAGNOSIS — D485 Neoplasm of uncertain behavior of skin: Secondary | ICD-10-CM

## 2021-05-24 NOTE — Patient Instructions (Signed)

## 2021-06-11 ENCOUNTER — Other Ambulatory Visit: Payer: Self-pay | Admitting: Family Medicine

## 2021-06-17 ENCOUNTER — Encounter: Payer: Self-pay | Admitting: Dermatology

## 2021-06-17 NOTE — Progress Notes (Signed)
   Follow-Up Visit   Subjective  Calvin Burnett is a 87 y.o. male who presents for the following: Procedure (Here for treatment. ).  Biopsy-proven skin cancer leg plus new lesion on same side Location:  Duration:  Quality:  Associated Signs/Symptoms: Modifying Factors:  Severity:  Timing: Context:   Objective  Well appearing patient in no apparent distress; mood and affect are within normal limits. Left Lower Leg - Anterior Lesion identified in room by Dr.Jude Linck and nurse [phone]  Left Lower Leg - Anterior Waxy pink 1.2 cm slightly raised nodule in          Legs examined.   Assessment & Plan    Basal cell carcinoma (BCC) of skin of left lower extremity including hip (2) Left Lower Leg - Anterior  Destruction of lesion Complexity: simple   Destruction method: electrodesiccation and curettage   Informed consent: discussed and consent obtained   Timeout:  patient name, date of birth, surgical site, and procedure verified Anesthesia: the lesion was anesthetized in a standard fashion   Anesthetic:  1% lidocaine w/ epinephrine 1-100,000 local infiltration Curettage performed in three different directions: Yes   Curettage cycles:  3 Lesion length (cm):  1.5 Lesion width (cm):  1.5 Margin per side (cm):  0 Final wound size (cm):  1.5 Hemostasis achieved with:  ferric subsulfate Outcome: patient tolerated procedure well with no complications   Post-procedure details: wound care instructions given   Additional details:  Wound innoculated with 5 fluorouracil solution.  Left Lower Leg - Anterior  Skin / nail biopsy Type of biopsy: tangential   Informed consent: discussed and consent obtained   Timeout: patient name, date of birth, surgical site, and procedure verified   Anesthesia: the lesion was anesthetized in a standard fashion   Anesthetic:  1% lidocaine w/ epinephrine 1-100,000 local infiltration Instrument used: flexible razor blade   Hemostasis  achieved with: ferric subsulfate   Outcome: patient tolerated procedure well   Post-procedure details: wound care instructions given    Destruction of lesion Complexity: simple   Destruction method: electrodesiccation and curettage   Informed consent: discussed and consent obtained   Timeout:  patient name, date of birth, surgical site, and procedure verified Anesthesia: the lesion was anesthetized in a standard fashion   Anesthetic:  1% lidocaine w/ epinephrine 1-100,000 local infiltration Curettage performed in three different directions: Yes   Curettage cycles:  3 Lesion length (cm):  2 Lesion width (cm):  2 Margin per side (cm):  0 Final wound size (cm):  2 Hemostasis achieved with:  aluminum chloride Outcome: patient tolerated procedure well with no complications   Post-procedure details: wound care instructions given    Specimen 2 - Surgical pathology Differential Diagnosis: scc vs bcc  Check Margins: No  After shave biopsy the base of the lesion was treated with curettage plus 5-fluorouracil.      I, [name], MD, have reviewed all documentation for this visit.  The documentation on 06/17/21 for the exam, diagnosis, procedures, and orders are all accurate and complete.

## 2021-06-26 DIAGNOSIS — Z7984 Long term (current) use of oral hypoglycemic drugs: Secondary | ICD-10-CM | POA: Diagnosis not present

## 2021-06-26 DIAGNOSIS — E1122 Type 2 diabetes mellitus with diabetic chronic kidney disease: Secondary | ICD-10-CM | POA: Diagnosis not present

## 2021-06-26 DIAGNOSIS — N183 Chronic kidney disease, stage 3 unspecified: Secondary | ICD-10-CM | POA: Diagnosis not present

## 2021-07-11 ENCOUNTER — Other Ambulatory Visit: Payer: Self-pay | Admitting: Family Medicine

## 2021-07-17 ENCOUNTER — Encounter: Payer: Self-pay | Admitting: Family Medicine

## 2021-07-17 ENCOUNTER — Ambulatory Visit (INDEPENDENT_AMBULATORY_CARE_PROVIDER_SITE_OTHER): Payer: PPO | Admitting: Family Medicine

## 2021-07-17 VITALS — BP 122/80 | HR 86 | Temp 97.9°F | Ht 70.0 in | Wt 161.2 lb

## 2021-07-17 DIAGNOSIS — R310 Gross hematuria: Secondary | ICD-10-CM

## 2021-07-17 DIAGNOSIS — N183 Chronic kidney disease, stage 3 unspecified: Secondary | ICD-10-CM | POA: Diagnosis not present

## 2021-07-17 DIAGNOSIS — I480 Paroxysmal atrial fibrillation: Secondary | ICD-10-CM | POA: Diagnosis not present

## 2021-07-17 DIAGNOSIS — I10 Essential (primary) hypertension: Secondary | ICD-10-CM

## 2021-07-17 DIAGNOSIS — E1122 Type 2 diabetes mellitus with diabetic chronic kidney disease: Secondary | ICD-10-CM | POA: Diagnosis not present

## 2021-07-17 LAB — COMPREHENSIVE METABOLIC PANEL
ALT: 23 U/L (ref 0–53)
AST: 30 U/L (ref 0–37)
Albumin: 3.7 g/dL (ref 3.5–5.2)
Alkaline Phosphatase: 73 U/L (ref 39–117)
BUN: 40 mg/dL — ABNORMAL HIGH (ref 6–23)
CO2: 26 mEq/L (ref 19–32)
Calcium: 9.2 mg/dL (ref 8.4–10.5)
Chloride: 100 mEq/L (ref 96–112)
Creatinine, Ser: 1.91 mg/dL — ABNORMAL HIGH (ref 0.40–1.50)
GFR: 31.54 mL/min — ABNORMAL LOW (ref >60.00)
Glucose, Bld: 147 mg/dL — ABNORMAL HIGH (ref 70–99)
Potassium: 4 mEq/L (ref 3.5–5.1)
Sodium: 136 mEq/L (ref 135–145)
Total Bilirubin: 1.1 mg/dL (ref 0.2–1.2)
Total Protein: 7.7 g/dL (ref 6.0–8.3)

## 2021-07-17 LAB — POC URINALSYSI DIPSTICK (AUTOMATED)
Bilirubin, UA: POSITIVE
Blood, UA: POSITIVE
Glucose, UA: POSITIVE — AB
Ketones, UA: POSITIVE
Nitrite, UA: POSITIVE
Protein, UA: POSITIVE — AB
Spec Grav, UA: 1.025 (ref 1.010–1.025)
Urobilinogen, UA: 2 E.U./dL — AB
pH, UA: 5 (ref 5.0–8.0)

## 2021-07-17 LAB — CBC WITH DIFFERENTIAL/PLATELET
Basophils Absolute: 0 10*3/uL (ref 0.0–0.1)
Basophils Relative: 0.4 % (ref 0.0–3.0)
Eosinophils Absolute: 0.1 10*3/uL (ref 0.0–0.7)
Eosinophils Relative: 0.9 % (ref 0.0–5.0)
HCT: 42 % (ref 39.0–52.0)
Hemoglobin: 14 g/dL (ref 13.0–17.0)
Lymphocytes Relative: 25.3 % (ref 12.0–46.0)
Lymphs Abs: 1.9 10*3/uL (ref 0.7–4.0)
MCHC: 33.2 g/dL (ref 30.0–36.0)
MCV: 95 fl (ref 78.0–100.0)
Monocytes Absolute: 0.8 10*3/uL (ref 0.1–1.0)
Monocytes Relative: 10.5 % (ref 3.0–12.0)
Neutro Abs: 4.7 10*3/uL (ref 1.4–7.7)
Neutrophils Relative %: 62.9 % (ref 43.0–77.0)
Platelets: 183 10*3/uL (ref 150.0–400.0)
RBC: 4.42 Mil/uL (ref 4.22–5.81)
RDW: 14.2 % (ref 11.5–15.5)
WBC: 7.5 10*3/uL (ref 4.0–10.5)

## 2021-07-17 LAB — URINALYSIS, MICROSCOPIC ONLY

## 2021-07-17 LAB — HEMOGLOBIN A1C: Hgb A1c MFr Bld: 6.5 % (ref 4.6–6.5)

## 2021-07-17 NOTE — Patient Instructions (Addendum)
Lets hold off on eliquis short term- this does increase risk of stroke but want bleeding to stop and you will likely need to have a peek in the bladder which would want you off of eliquis ideally for. Lets hold until you see urology unless its going to be more than 2 weeks.   Urgent urology referral- team will try to update you before you leave  Please stop by lab before you go If you have mychart- we will send your results within 3 business days of [place] receiving them.  If you do not have mychart- we will call you about results within 5 business days of [place] receiving them.  *please also note that you will see labs on mychart as soon as they post. I will later go in and write notes on them- will say "notes from [name]"  Recommended follow up: Return for as needed for new, worsening, persistent symptoms.

## 2021-07-17 NOTE — Progress Notes (Signed)
Phone [phone] In person visit   Subjective:   Calvin Burnett is a 88 y.o. year old very pleasant male patient who presents for/with See problem oriented charting Chief Complaint  Patient presents with   blood in urine    Pt noticed blood in urine on 07/15/21. Pt denies pain.   This visit occurred during the SARS-CoV-2 public health emergency.  Safety protocols were in place, including screening questions prior to the visit, additional usage of staff PPE, and extensive cleaning of exam room while observing appropriate contact time as indicated for disinfecting solutions.   Past Medical History-  Patient Active Problem List   Diagnosis Date Noted   Intra-abdominal abscess ([place]) 04/24/2019    Priority: High   B12 deficiency 12/04/2017    Priority: High   pT1pN0 colon cancer s/p robotic right colectomy 11/26/2017 10/02/2017    Priority: High   AF (paroxysmal atrial fibrillation) ([place]) 04/09/2017    Priority: High   Night sweats 09/16/2016    Priority: High   Major depressive disorder with single episode, in full remission ([place]) 07/29/2016    Priority: High   Rectal pain 10/14/2014    Priority: High   Prostate cancer ([place]) 08/12/2012    Priority: High   Type 2 diabetes mellitus ([place]) 07/31/2010    Priority: High   Vascular dementia ([place]) 07/27/2008    Priority: High   Mild aortic stenosis 04/05/2021    Priority: Medium    BPPV (benign paroxysmal positional vertigo) 12/17/2019    Priority: Medium    History of adenomatous polyp of colon 10/27/2014    Priority: Medium    Syncope 09/27/2014    Priority: Medium    CKD (chronic kidney disease), stage III ([place]) 06/01/2014    Priority: Medium    Hyperlipidemia associated with type 2 diabetes mellitus ([place]) 03/01/2014    Priority: Medium    Basal cell carcinoma 05/03/2009    Priority: Medium    Essential hypertension 01/30/2007    Priority: Medium    Gout 10/28/2019    Priority: Low   Aortic atherosclerosis ([place])  01/29/2019    Priority: Low   Stricture of sigmoid s/p robotic sigmoidectomy 11/26/2017 11/26/2017    Priority: Low   Chronic anticoagulation 11/26/2017    Priority: Low   Right inguinal hernia 10/02/2017    Priority: Low   Bowel habit changes 10/14/2014    Priority: Low   Bradycardia 09/27/2014    Priority: Low   LEG CRAMPS, NOCTURNAL 04/30/2010    Priority: Low   Cervical spondylosis without myelopathy 12/11/2007    Priority: Low   GERD (gastroesophageal reflux disease) 11/05/2007    Priority: Low    Medications- reviewed and updated Current Outpatient Medications  Medication Sig Dispense Refill   apixaban (ELIQUIS) 2.5 MG TABS tablet Take 1 tablet (2.5 mg total) by mouth 2 (two) times daily. 180 tablet 3   blood glucose meter kit and supplies KIT Dispense based on patient and insurance preference. Use up to four times daily as directed. Dx E11.9 1 each 0   cyanocobalamin 1000 MCG tablet Take 1,000 mcg by mouth daily.     latanoprost (XALATAN) 0.005 % ophthalmic solution Place 1 drop into both eyes at bedtime.   12   memantine (NAMENDA XR) 28 MG CP24 24 hr capsule TAKE 1 CAPSULE (28 MG TOTAL) BY MOUTH DAILY. 30 capsule 11   metFORMIN (GLUCOPHAGE-XR) 500 MG 24 hr tablet TAKE 1 TABLET BY MOUTH EVERY DAY WITH BREAKFAST 30 tablet 5  metoprolol succinate (TOPROL-XL) 25 MG 24 hr tablet TAKE 1/2 TABLET BY MOUTH EVERY DAY 45 tablet 1   rosuvastatin (CRESTOR) 20 MG tablet Take 1 tablet (20 mg total) by mouth once a week. 13 tablet 3   No current facility-administered medications for this visit.     Objective:  BP 122/80    Pulse 86    Temp 97.9 F (36.6 C)    Ht 5' 10"  (1.778 m)    Wt 161 lb 3.2 oz (73.1 kg)    SpO2 97%    BMI 23.13 kg/m  Gen: NAD, resting comfortably CV: RRR no murmurs rubs or gallops Lungs: CTAB no crackles, wheeze, rhonchi Abdomen: soft/nontender/nondistended Ext: no edema Skin: warm, dry Neuro: Baseline confusion noted but able to recount story reasonably  well Results for orders placed or performed in visit on 07/17/21 (from the past 24 hour(s))  POCT Urinalysis Dipstick (Automated)     Status: Abnormal   Collection Time: 07/17/21 11:04 AM  Result Value Ref Range   Color, UA dark red    Clarity, UA bloody    Glucose, UA Positive (A) Negative   Bilirubin, UA positive    Ketones, UA positive    Spec Grav, UA 1.025 1.010 - 1.025   Blood, UA positive    pH, UA 5.0 5.0 - 8.0   Protein, UA Positive (A) Negative   Urobilinogen, UA 2.0 (A) 0.2 or 1.0 E.U./dL   Nitrite, UA positive    Leukocytes, UA Large (3+) (A) Negative       Assessment and Plan   # Gross Hematuria S:Patient reports that he had noticed blood in his urine on 07/15/21. Denies any pain- specifically CVA or groin. Every time he urinates notes gross blood without clots.  No dysuria. Mild polyuria. No fever or chills.  A/P: 88 year old male with history of prostate cancer and history of smoking though quit over 20 years ago presenting with gross hematuria-urgent referral to urology was placed-has followed in the past for prostate cancer -Appears most recent visit (reviewed chart after visit) was 10/18/2019 when patient had gross hematuria.  Plan was to treat for infection and return in 2 weeks for cystoscopy-I do not have results of cystoscopy.  There was some concern bleeding could be related to radiation related irritation exacerbated by Eliquis - We did opt to short-term to hold his Eliquis that we discussed this does increase his risk of stroke-if he cannot get in within 2 weeks I will have him restart - Update CBC/CMP  #Atrial fibrillation S: Patient is anticoagulated with Eliquis 2.55 mg twice daily as well as rate controlled with metoprolol 12.5 mg extended release A/P: Patient is appropriately anticoagulated and rate controlled-Eliquis likely increases risk of hematuria-we opted to hold short-term until he can get into urology-we discussed short-term increased risk of stroke  but will restart as soon as we can  # Diabetes S: Compliant withMetformin 500 mg daily extended release Lab Results  Component Value Date   HGBA1C 6.6 (H) 04/05/2021   HGBA1C 6.7 (H) 11/23/2020   HGBA1C 6.0 07/20/2020  A/P: Has been well controlled-we will update A1c with labs today  #hyperlipidemia/history of stroke-may have been atrial fibrillation related S: compliant with rosuvastatin 20 mg Lab Results  Component Value Date   CHOL 113 04/05/2021   HDL 37.70 (L) 04/05/2021   LDLCALC 57 04/05/2021   LDLDIRECT 57.0 11/23/2020   TRIG 91.0 04/05/2021   CHOLHDL 3 04/05/2021   A/P: Has been well controlled with  LDL under 70-continue current medication  #hypertension/CKD stage III S: compliant with  metoprolol 12.5 mg extended release - prior amlodipine 2.38m- now off - stopped hctz 12.59mdue to lower BP and dizziness.   CKD stage III with creatinine ranging from 1.2-1.5 for the most part. Night sweats in the past on ramipril and urine microalbumin to creatinine ratio has not been elevated so we have not restarted ACE inhibitor  BP Readings from Last 3 Encounters:  07/17/21 122/80  04/05/21 126/80  02/08/21 (!) 144/78   A/P: For HTN-well-controlled-continue metoprolol For CKD-update CMP with labs today-likely continue current medication   Recommended follow up: Return for as needed for new, worsening, persistent symptoms. Future Appointments  Date Time Provider [place]/06/2022  9:30 AM TaLavonna MonarchMD [facility] CD[facility]  08/09/2021 10:00 AM HuMarin OlpMD [facility] P[facility]  08/16/2021  9:30 AM WeRondel JumboPA-C [facility] None  08/20/2021  8:45 AM TaLavonna MonarchMD [facility] CD[facility]  02/14/2022  8:30 AM AqCameron SprangMD [facility] None    Lab/Order associations:   ICD-10-CM   1. Gross hematuria  R31.0 POCT Urinalysis Dipstick (Automated)    Urine Microscopic    Urine Microscopic    Urine Culture    Urine Culture    Ambulatory referral to Urology     Ambulatory referral to Urology    2. Essential hypertension  I10 Comprehensive metabolic panel    CBC with Differential/Platelet    3. Stage 3 chronic kidney disease, unspecified whether stage 3a or 3b CKD ([name])  N18.30     4. Type 2 diabetes mellitus with chronic kidney disease, without long-term current use of insulin, unspecified CKD stage ([name])  E11.22 Hemoglobin A1c    5. AF (paroxysmal atrial fibrillation) ([place] I48.0      I,Harris Phan,acting as a scribe for StGarret ReddishMD.,have documented all relevant documentation on the behalf of StGarret ReddishMD,as directed by  StGarret ReddishMD while in the presence of StGarret ReddishMD.   I, StGarret ReddishMD, have reviewed all documentation for this visit. The documentation on 07/17/21 for the exam, diagnosis, procedures, and orders are all accurate and complete.   Return precautions advised.  StGarret ReddishMD

## 2021-07-18 ENCOUNTER — Other Ambulatory Visit: Payer: Self-pay

## 2021-07-18 DIAGNOSIS — R748 Abnormal levels of other serum enzymes: Secondary | ICD-10-CM

## 2021-07-18 LAB — URINE CULTURE
MICRO NUMBER:: 12820417
SPECIMEN QUALITY:: ADEQUATE

## 2021-07-20 DIAGNOSIS — R310 Gross hematuria: Secondary | ICD-10-CM | POA: Diagnosis not present

## 2021-07-20 DIAGNOSIS — C61 Malignant neoplasm of prostate: Secondary | ICD-10-CM | POA: Diagnosis not present

## 2021-07-25 ENCOUNTER — Other Ambulatory Visit: Payer: Self-pay

## 2021-07-25 ENCOUNTER — Other Ambulatory Visit (INDEPENDENT_AMBULATORY_CARE_PROVIDER_SITE_OTHER): Payer: PPO

## 2021-07-25 DIAGNOSIS — R748 Abnormal levels of other serum enzymes: Secondary | ICD-10-CM

## 2021-07-25 LAB — BASIC METABOLIC PANEL
BUN: 19 mg/dL (ref 6–23)
CO2: 28 mEq/L (ref 19–32)
Calcium: 9 mg/dL (ref 8.4–10.5)
Chloride: 106 mEq/L (ref 96–112)
Creatinine, Ser: 1.41 mg/dL (ref 0.40–1.50)
GFR: 45.39 mL/min — ABNORMAL LOW (ref >60.00)
Glucose, Bld: 169 mg/dL — ABNORMAL HIGH (ref 70–99)
Potassium: 4.2 mEq/L (ref 3.5–5.1)
Sodium: 139 mEq/L (ref 135–145)

## 2021-07-26 ENCOUNTER — Encounter: Payer: PPO | Admitting: Dermatology

## 2021-07-27 DIAGNOSIS — R310 Gross hematuria: Secondary | ICD-10-CM | POA: Diagnosis not present

## 2021-07-27 DIAGNOSIS — I700 Atherosclerosis of aorta: Secondary | ICD-10-CM | POA: Diagnosis not present

## 2021-07-27 DIAGNOSIS — N281 Cyst of kidney, acquired: Secondary | ICD-10-CM | POA: Diagnosis not present

## 2021-08-09 ENCOUNTER — Telehealth: Payer: Self-pay | Admitting: Family Medicine

## 2021-08-09 ENCOUNTER — Ambulatory Visit (INDEPENDENT_AMBULATORY_CARE_PROVIDER_SITE_OTHER): Payer: PPO | Admitting: Family Medicine

## 2021-08-09 ENCOUNTER — Other Ambulatory Visit: Payer: Self-pay

## 2021-08-09 ENCOUNTER — Encounter: Payer: Self-pay | Admitting: Family Medicine

## 2021-08-09 ENCOUNTER — Telehealth: Payer: Self-pay

## 2021-08-09 VITALS — BP 112/70 | HR 45 | Temp 97.9°F | Wt 158.0 lb

## 2021-08-09 DIAGNOSIS — F325 Major depressive disorder, single episode, in full remission: Secondary | ICD-10-CM | POA: Diagnosis not present

## 2021-08-09 DIAGNOSIS — E785 Hyperlipidemia, unspecified: Secondary | ICD-10-CM | POA: Diagnosis not present

## 2021-08-09 DIAGNOSIS — N183 Chronic kidney disease, stage 3 unspecified: Secondary | ICD-10-CM | POA: Diagnosis not present

## 2021-08-09 DIAGNOSIS — I10 Essential (primary) hypertension: Secondary | ICD-10-CM

## 2021-08-09 DIAGNOSIS — I700 Atherosclerosis of aorta: Secondary | ICD-10-CM | POA: Diagnosis not present

## 2021-08-09 DIAGNOSIS — E1169 Type 2 diabetes mellitus with other specified complication: Secondary | ICD-10-CM

## 2021-08-09 DIAGNOSIS — I480 Paroxysmal atrial fibrillation: Secondary | ICD-10-CM

## 2021-08-09 DIAGNOSIS — F0150 Vascular dementia without behavioral disturbance: Secondary | ICD-10-CM

## 2021-08-09 DIAGNOSIS — E1122 Type 2 diabetes mellitus with diabetic chronic kidney disease: Secondary | ICD-10-CM

## 2021-08-09 NOTE — Telephone Encounter (Signed)
[facility] @ [phone] , request last visit notes, CT notes and labs, they are being faxed over now

## 2021-08-09 NOTE — Patient Instructions (Addendum)
Health Maintenance Due  Topic Date Due   Zoster Vaccines- Shingrix (2 of 2)  Please check with your pharmacy to see if they have the shingrix vaccine. If they do- please get this immunization and update [place] by phone call or mychart with dates you receive the vaccine  09/10/2017   OPHTHALMOLOGY EXAM   Sign release of information at the check out desk for last diabetic eye exam  04/07/2019   COVID-19 Vaccine (4 - Booster for [employer] series)  Let us know when you get bivalent booster 06/26/2020   No labs today as done recently.   Recommended follow up: Return in about 4 months (around 12/07/2021) for follow up- or sooner if needed.

## 2021-08-09 NOTE — Progress Notes (Signed)
Phone [phone] In person visit   Subjective:   Calvin Burnett is a 88 y.o. year old very pleasant male patient who presents for/with See problem oriented charting Chief Complaint  Patient presents with   Diabetes    Follow up    This visit occurred during the SARS-CoV-2 public health emergency.  Safety protocols were in place, including screening questions prior to the visit, additional usage of staff PPE, and extensive cleaning of exam room while observing appropriate contact time as indicated for disinfecting solutions.   Past Medical History-  Patient Active Problem List   Diagnosis Date Noted   Intra-abdominal abscess ([place]) 04/24/2019    Priority: High   B12 deficiency 12/04/2017    Priority: High   pT1pN0 colon cancer s/p robotic right colectomy 11/26/2017 10/02/2017    Priority: High   AF (paroxysmal atrial fibrillation) ([place]) 04/09/2017    Priority: High   Night sweats 09/16/2016    Priority: High   Major depressive disorder with single episode, in full remission ([place]) 07/29/2016    Priority: High   Rectal pain 10/14/2014    Priority: High   Prostate cancer ([place]) 08/12/2012    Priority: High   Type 2 diabetes mellitus ([place]) 07/31/2010    Priority: High   Vascular dementia ([place]) 07/27/2008    Priority: High   Mild aortic stenosis 04/05/2021    Priority: Medium    BPPV (benign paroxysmal positional vertigo) 12/17/2019    Priority: Medium    History of adenomatous polyp of colon 10/27/2014    Priority: Medium    Syncope 09/27/2014    Priority: Medium    CKD (chronic kidney disease), stage III ([place]) 06/01/2014    Priority: Medium    Hyperlipidemia associated with type 2 diabetes mellitus ([place]) 03/01/2014    Priority: Medium    Basal cell carcinoma 05/03/2009    Priority: Medium    Essential hypertension 01/30/2007    Priority: Medium    Gout 10/28/2019    Priority: Low   Aortic atherosclerosis ([place]) 01/29/2019    Priority: Low   Stricture of sigmoid  s/p robotic sigmoidectomy 11/26/2017 11/26/2017    Priority: Low   Chronic anticoagulation 11/26/2017    Priority: Low   Right inguinal hernia 10/02/2017    Priority: Low   Bowel habit changes 10/14/2014    Priority: Low   Bradycardia 09/27/2014    Priority: Low   LEG CRAMPS, NOCTURNAL 04/30/2010    Priority: Low   Cervical spondylosis without myelopathy 12/11/2007    Priority: Low   GERD (gastroesophageal reflux disease) 11/05/2007    Priority: Low    Medications- reviewed and updated Current Outpatient Medications  Medication Sig Dispense Refill   apixaban (ELIQUIS) 2.5 MG TABS tablet Take 1 tablet (2.5 mg total) by mouth 2 (two) times daily. 180 tablet 3   cyanocobalamin 1000 MCG tablet Take 1,000 mcg by mouth daily.     latanoprost (XALATAN) 0.005 % ophthalmic solution Place 1 drop into both eyes at bedtime.   12   memantine (NAMENDA XR) 28 MG CP24 24 hr capsule TAKE 1 CAPSULE (28 MG TOTAL) BY MOUTH DAILY. 30 capsule 11   metFORMIN (GLUCOPHAGE-XR) 500 MG 24 hr tablet TAKE 1 TABLET BY MOUTH EVERY DAY WITH BREAKFAST 30 tablet 5   metoprolol succinate (TOPROL-XL) 25 MG 24 hr tablet TAKE 1/2 TABLET BY MOUTH EVERY DAY 45 tablet 1   rosuvastatin (CRESTOR) 20 MG tablet Take 1 tablet (20 mg total) by mouth once a week.  13 tablet 3   blood glucose meter kit and supplies KIT Dispense based on patient and insurance preference. Use up to four times daily as directed. Dx E11.9 1 each 0   No current facility-administered medications for this visit.     Objective:  BP 112/70 (BP Location: Left Arm, Patient Position: Sitting)    Pulse (!) 45    Temp 97.9 F (36.6 C) (Tympanic)    Wt 158 lb (71.7 kg)    SpO2 97%    BMI 22.67 kg/m  Gen: NAD, resting comfortably CV: RRR no murmurs rubs or gallops Lungs: CTAB no crackles, wheeze, rhonchi Abdomen: soft/nontender/nondistended. No rebound or guarding.  Ext: no edema Skin: warm, dry    Assessment and Plan   #Gross hematuria S: Patient  noted blood in his urine on July 15, 2021.  Patient with history of prostate cancer and smoking history but quit over 20 years ago.  We held his Eliquis short-term  (he has restarted) and try to get him in quickly with urology-referral was placed.  Urine culture was negative here.  Today he reports had CT scan - I cannot see the records yet -no bleeding since he had the scan fortunately. He does not recall colonoscopy- daughter was not at visit.  A/P: we are going to get more info by reaching out to urology. Daughter here and has to get added to [facility] at urology.    #Vascular dementia- Dr. Delice Lesch S: Patient is compliant with memantine/Namenda.  Patient has been on memantine for many years.  - overall stable lately A/P: doing well- reports upcoming visit with neurology   #Atrial fibrillation S: Patient is anticoagulated with Eliquis 2.5 mg twice daily as well as rate controlled with metoprolol 12.5 mg extended release A/P: Appropriately anticoagulated and rate controlled-continue current medication  # Diabetes S: Compliant with Metformin 500 mg XR daily with breakfast Exercise and diet- walking most days as long as not too cold Lab Results  Component Value Date   HGBA1C 6.5 07/17/2021   HGBA1C 6.6 (H) 04/05/2021   HGBA1C 6.7 (H) 11/23/2020   A/P: Well-controlled on recent check-continue current medication   #hyperlipidemia/history of stroke-may have been atrial fibrillation related S: compliant with rosuvastatin 20 mg once a week,  not on aspirin as on Eliquis Lab Results  Component Value Date   CHOL 113 04/05/2021   HDL 37.70 (L) 04/05/2021   LDLCALC 57 04/05/2021   LDLDIRECT 57.0 11/23/2020   TRIG 91.0 04/05/2021   CHOLHDL 3 04/05/2021   A/P: LDL very well controlled with history of stroke goal being 70 or less-continue current medication-too soon for repeat  Also has aortic atherosclerosis and goal for this will be LDL 70 or less-cardiac-continue current medication and suspect  atherosclerosis stable   #hypertension/CKD stage III S: compliant with  metoprolol 12.5 mg extended release -Also stopped amlodipine 2.55m - stopped hctz 12.569mdue to lower BP and dizziness.   CKD stage III with creatinine ranging from 1.2-1.5 for the most part.  This was up to 1.9 on most recent check- he pushed fluids and this came back down to 1.5 BP Readings from Last 3 Encounters:  08/09/21 112/70  07/17/21 122/80  04/05/21 126/80   A/P: Blood pressure is well controlled.  Continue current medication. CKD improved on recheck with better hydration.     # Depression S: Medication:none  Depression screen [facility]9 07/17/2021 07/17/2021 04/05/2021  Decreased Interest 0 0 0  Down, Depressed, Hopeless 0 0 0  PHQ - 2 Score 0 0 0  Altered sleeping 0 0 0  Tired, decreased energy 1 1 0  Change in appetite 0 0 0  Feeling bad or failure about yourself  0 0 0  Trouble concentrating 0 0 0  Moving slowly or fidgety/restless 0 0 0  Suicidal thoughts 0 0 0  PHQ-9 Score 1 1 0  Difficult doing work/chores Not difficult at all Not difficult at all Not difficult at all  Some recent data might be hidden  A/P: prior taken off SSRI but has done well despite coming off of this- full remission off meds  Recommended follow up: Return in about 4 months (around 12/07/2021) for follow up- or sooner if needed. Future Appointments  Date Time Provider [place]  08/16/2021  9:30 AM Rondel Jumbo, PA-C [facility] None  08/20/2021  8:45 AM [name], MD [facility] CD[facility]SO  02/14/2022  8:30 AM Cameron Sprang, MD [facility] None    Lab/Order associations:   ICD-10-CM   1. Vascular dementia, unspecified dementia severity, unspecified whether behavioral, psychotic, or mood disturbance or anxiety ([place])  F01.50     2. Major depressive disorder with single episode, in full remission ([place]) Chronic F32.5     3. Aortic atherosclerosis ([name]) Chronic I70.0     4. Type 2 diabetes mellitus with chronic kidney disease,  without long-term current use of insulin, unspecified CKD stage ([name])  E11.22     5. AF (paroxysmal atrial fibrillation) ([name])  I48.0     6. Hyperlipidemia associated with type 2 diabetes mellitus ([name])  E11.69    E78.5     7. Essential hypertension  I10     8. Stage 3 chronic kidney disease, unspecified whether stage 3a or 3b CKD ([place])  N18.30       No orders of the defined types were placed in this encounter.   Return precautions advised.  [name], MD

## 2021-08-09 NOTE — Telephone Encounter (Signed)
Pt's daughter stated urology results were faxed to out office

## 2021-08-16 ENCOUNTER — Encounter: Payer: Self-pay | Admitting: Physician Assistant

## 2021-08-16 ENCOUNTER — Ambulatory Visit: Payer: PPO | Admitting: Physician Assistant

## 2021-08-16 ENCOUNTER — Other Ambulatory Visit: Payer: Self-pay

## 2021-08-16 VITALS — BP 106/56 | HR 50 | Ht 70.0 in | Wt 162.6 lb

## 2021-08-16 DIAGNOSIS — F01B0 Vascular dementia, moderate, without behavioral disturbance, psychotic disturbance, mood disturbance, and anxiety: Secondary | ICD-10-CM

## 2021-08-16 MED ORDER — MEMANTINE HCL ER 28 MG PO CP24: 28.0000 mg | ORAL_CAPSULE | Freq: Every day | ORAL | 11 refills | Status: DC

## 2021-08-16 NOTE — Addendum Note (Signed)
Addended by: Sharene Butters E on: 08/16/2021 10:08 AM   Modules accepted: Level of Service

## 2021-08-16 NOTE — Patient Instructions (Signed)
Good to see you!! Continue Namenda XR 28mg  daily.  Follow-up in 6 months, call for any changes   FALL PRECAUTIONS: Be cautious when walking. Scan the area for obstacles that may increase the risk of trips and falls. When getting up in the mornings, sit up at the edge of the bed for a few minutes before getting out of bed. Consider elevating the bed at the head end to avoid drop of blood pressure when getting up. Walk always in a well-lit room (use night lights in the walls). Avoid area rugs or power cords from appliances in the middle of the walkways. Use a walker or a cane if necessary and consider physical therapy for balance exercise. Get your eyesight checked regularly.  FINANCIAL OVERSIGHT: Supervision, especially oversight when making financial decisions or transactions is also recommended as difficulties arise.  HOME SAFETY: Consider the safety of the kitchen when operating appliances like stoves, microwave oven, and blender. Consider having supervision and share cooking responsibilities until no longer able to participate in those. Accidents with firearms and other hazards in the house should be identified and addressed as well.  DRIVING: Regarding driving, in patients with progressive memory problems, driving will be impaired. We advise to have someone else do the driving if trouble finding directions or if minor accidents are reported. Independent driving assessment is available to determine safety of driving.  ABILITY TO BE LEFT ALONE: If patient is unable to contact 911 operator, consider using LifeLine, or when the need is there, arrange for someone to stay with patients. Smoking is a fire hazard, consider supervision or cessation. Risk of wandering should be assessed by caregiver and if detected at any point, supervision and safe proof recommendations should be instituted.  MEDICATION SUPERVISION: Inability to self-administer medication needs to be constantly addressed. Implement a  mechanism to ensure safe administration of the medications.  RECOMMENDATIONS FOR ALL PATIENTS WITH MEMORY PROBLEMS: 1. Continue to exercise (Recommend 30 minutes of walking everyday, or 3 hours every week) 2. Increase social interactions - continue going to [place] and enjoy social gatherings with friends and family 3. Eat healthy, avoid fried foods and eat more fruits and vegetables 4. Maintain adequate blood pressure, blood sugar, and blood cholesterol level. Reducing the risk of stroke and cardiovascular disease also helps promoting better memory. 5. Avoid stressful situations. Live a simple life and avoid aggravations. Organize your time and prepare for the next day in anticipation. 6. Sleep well, avoid any interruptions of sleep and avoid any distractions in the bedroom that may interfere with adequate sleep quality 7. Avoid sugar, avoid sweets as there is a strong link between excessive sugar intake, diabetes, and cognitive impairment We discussed the Mediterranean diet, which has been shown to help patients reduce the risk of progressive memory disorders and reduces cardiovascular risk. This includes eating fish, eat fruits and green leafy vegetables, nuts like almonds and hazelnuts, walnuts, and also use olive oil. Avoid fast foods and fried foods as much as possible. Avoid sweets and sugar as sugar use has been linked to worsening of memory function.  There is always a concern of gradual progression of memory problems. If this is the case, then we may need to adjust level of care according to patient needs. Support, both to the patient and caregiver, should then be put into place.

## 2021-08-16 NOTE — Progress Notes (Addendum)
° °Assessment/Plan:  ° ° °Vascular dementia without behavioral disturbance °Patient is stable from the cognitive standpoint, he is currently on Namenda XR 28 mg daily, tolerating well. ° ° Recommendations:  ° °Discussed safety both in and out of the home.  °Discussed the importance of regular daily schedule with inclusion of crossword puzzles to maintain brain function.  °Continue to monitor mood by PCP °Stay active at least 30 minutes at least 3 times a week.  °Naps should be scheduled and should be no longer than 60 minutes and should not occur after 2 PM.  °Mediterranean diet is recommended  °Control cardiovascular risk factors, BP and pulse are slightly low today although asymptomatic. °Continue to monitor driving. °Continue Namenda XR 28 mg daily side effects were discussed °Follow up in 6 months. ° ° °Case discussed with Dr. Aquino who agrees with the plan ° ° ° ° °Subjective:  ° ° °Calvin Burnett is a very pleasant 88 y.o. RH male with a history of hypertension, hyperlipidemia, diabetes, atrial fibrillation on Eliquis, history of stroke, seen today in follow up for evaluation of vascular dementia.  This patient is accompanied in the office by his daughter Calvin Burnett who supplements the history.  Previous records as well as any outside records available were reviewed prior to todays visit.  Patient was last seen at our office on 02/08/21 at which time his MMSE was 21/30.  patient is currently on Namenda XR 28 mg daily without side effects (avoided a CHI due to bradycardia in the past). °Since his last visit, he and his daughter report symptoms are overall stable. He continues to live alone with very good local family support. His son and his brother check on him regularly, Calvin Burnett lives in [place] and calls him 3-4 times a day. He continues to drive and denies getting lost. Calvin Burnett denies any driving concerns. He continues to manage his on medications and finances without issues. He misplaces things and looks  for them even when they are right in front of him. He does not cook, occasionally he heats up the soup and denies leaving the stove on.  Most of the times he goes to [employer] and gets his morning food, which she uses for lunch as well.  He is independent with dressing and bathing. No headaches or further dizziness.  No further falls or head injuries. Sleep and mood are good. No personality changes, paranoia, or hallucinations.  Of note, the patient presented to his PCP with abdominal pain, and urinary complaints, initially felt to be due to kidney stones, however he was referred to urology and he is due for a cystoscopy on 08/29/2021 to further evaluate these.  He has been told by his PCP that he has stage III kidney disease and that he was dehydrated, and since then, he is trying to push more fluid in his diet.  No constipation or diarrhea.  No stroke symptoms. °  °  °History on Initial Assessment 05/25/2020: This is a pleasant 88 year old right-handed man with a history of hypertension, hyperlipidemia, diabetes, atrial fibrillation on Eliquis, stroke, presenting for evaluation of vascular dementia. He is accompanied by his daughter Calvin Burnett who helps supplement the history today. He feels his memory is pretty good. Calvin Burnett started noticing short-term memory changes around 2-3 years ago, but feels that he is overall doing well independently. He lives alone. He has a good family support system with several family members locally checking on his regularly. His wife passed away 5 years ago   years ago and he was under a lot of stress. There have been several deaths in the family and he has been through a lot. He denies getting lost driving, Calvin Burnett denies any driving concerns. He manages his own medications and finances, he and Calvin Burnett deny any issues with these. 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  He denies misplacing things frequently, Calvin Burnett reports he rarely misplaces things but gets  really hard on himself when he loses things. He denies any word-finding difficulties. He does not cook much and denies leaving the stove on. He is independent with dressing and bathing, Calvin Burnett denies any hygiene concerns. His brother is his POA. No family history of dementia. He denies any significant head injuries or alcohol use. Notes reviewed, he was started on Namenda by Dr. Arnoldo Morale in 2015. MMSE 26/30 in 2017, 21/30 in 2019.    He denies any headaches, dizziness, diplopia, dysarthria, dysphagia, neck pain, focal numbness/tingling/weakness, bowel/bladder dysfunction. No anosmia, tremors, no falls.  He walks a mile a day. He has some back pain today, right leg feels different. Sleep is good. No personality changes, paranoia or hallucinations. He denies any known prior history of stroke. Calvin Burnett reports that he passed out while at [employer], and this is when he had a stroke. EPIC notes reviewed, he had a syncopal episode in 2016 due to bradycardia. CT head at that time did not show any evidence of stroke. There was diffuse atrophy and chronic microvascular disease. He had an MRI brain with and without contrast in 11/2017 for memory loss, confusion. There was note of a remote right temporal lobe infarct with encephalomalacia that was not seen in 2016 imaging. I personally reviewed MRI brain without contrast done 12/2019 which did not show any acute changes. There was a large remote right temporal infarct, inferior division MCA territory, diffuse atrophy and mild chronic microvascular disease. There were remote hemorrhages along the right temporal occipital convexity without generalized chronic lobar hemorrhage, possible mild gliosis in the parasagittal or parietal lobes adjacent to the inferior falx.     Laboratory Data:      Lab Results  Component Value Date    TSH 3.83 10/28/2019         Lab Results  Component Value Date    VITAMINB12 214 07/20/2020      PREVIOUS MEDICATIONS:   CURRENT  MEDICATIONS:  Outpatient Encounter Medications as of 08/16/2021  Medication Sig   apixaban (ELIQUIS) 2.5 MG TABS tablet Take 1 tablet (2.5 mg total) by mouth 2 (two) times daily.   blood glucose meter kit and supplies KIT Dispense based on patient and insurance preference. Use up to four times daily as directed. Dx E11.9   cyanocobalamin 1000 MCG tablet Take 1,000 mcg by mouth daily.   latanoprost (XALATAN) 0.005 % ophthalmic solution Place 1 drop into both eyes at bedtime.    metFORMIN (GLUCOPHAGE-XR) 500 MG 24 hr tablet TAKE 1 TABLET BY MOUTH EVERY DAY WITH BREAKFAST   metoprolol succinate (TOPROL-XL) 25 MG 24 hr tablet TAKE 1/2 TABLET BY MOUTH EVERY DAY   rosuvastatin (CRESTOR) 20 MG tablet Take 1 tablet (20 mg total) by mouth once a week.   [DISCONTINUED] memantine (NAMENDA XR) 28 MG CP24 24 hr capsule TAKE 1 CAPSULE (28 MG TOTAL) BY MOUTH DAILY.   memantine (NAMENDA XR) 28 MG CP24 24 hr capsule Take 1 capsule (28 mg total) by mouth daily.   No facility-administered encounter medications on file as of  08/16/2021.     Objective:     PHYSICAL EXAMINATION:    VITALS:   Vitals:   08/16/21 0920  BP: (!) 106/56  Pulse: (!) 50  SpO2: 98%  Weight: 162 lb 9.6 oz (73.8 kg)  Height: 5' 10" (1.778 m)    GEN:  The patient appears stated age and is in NAD. HEENT:  Normocephalic, atraumatic.   Neurological examination:  General: NAD, well-groomed, appears stated age. Orientation: The patient is alert. Oriented to person, place and date Cranial nerves: There is good facial symmetry.The speech is fluent and clear. No aphasia or dysarthria. Fund of knowledge is appropriate. Recent and remote memory are impaired. Attention and concentration are reduced.  Able to name objects and repeat phrases.  Hearing is intact to conversational tone.    Sensation: Sensation is intact to light touch throughout Motor: Strength is at least antigravity x4. Tremors: none  DTR's 2/4 in [place]  Cognitive Assessment  05/25/2020  Visuospatial/ Executive (0/5) 3  Naming (0/3) 2  Attention: Read list of digits (0/2) 2  Attention: Read list of letters (0/1) 1  Attention: Serial 7 subtraction starting at 100 (0/3) 2  Language: Repeat phrase (0/2) 2  Language : Fluency (0/1) 1  Abstraction (0/2) 2  Delayed Recall (0/5) 0  Orientation (0/6) 4  Total 19  Adjusted Score (based on education) 20   MMSE - Mini Mental State Exam 02/08/2021 03/05/2017  Not completed: - Refused  Orientation to time 4 -  Orientation to Place 4 -  Registration 3 -  Attention/ Calculation 2 -  Recall 0 -  Language- name 2 objects 2 -  Language- repeat 1 -  Language- follow 3 step command 3 -  Language- read & follow direction 1 -  Write a sentence 1 -  Copy design 0 -  Total score 21 -    No flowsheet data found.     Movement examination: Tone: There is normal tone in the UE/LE Abnormal movements:  no tremor.  No myoclonus.  No asterixis.   Coordination:  There is no decremation with RAM's. Normal finger to nose  Gait and Station: The patient has no difficulty arising out of a deep-seated chair without the use of the hands. The patient's stride length is good.  Gait is cautious and slightly wide based.        Total time spent on today's visit was 30 minutes, including both face-to-face time and nonface-to-face time. Time included that spent on review of records (prior notes available to me/labs/imaging if pertinent), discussing treatment and goals, answering patient's questions and coordinating care.  Cc:  Marin Olp, MD Sharene Butters, PA-C

## 2021-08-20 ENCOUNTER — Ambulatory Visit: Payer: PPO | Admitting: Dermatology

## 2021-08-21 ENCOUNTER — Telehealth: Payer: Self-pay | Admitting: Family Medicine

## 2021-08-21 NOTE — Telephone Encounter (Signed)
Pt states he has fallen within the last hr. Pt and daughter are both concerned as to why he fell. Asked to speak with a nurse. Sent to triage. Waiting on notes

## 2021-08-22 ENCOUNTER — Emergency Department (HOSPITAL_BASED_OUTPATIENT_CLINIC_OR_DEPARTMENT_OTHER): Payer: PPO

## 2021-08-22 ENCOUNTER — Other Ambulatory Visit: Payer: Self-pay

## 2021-08-22 ENCOUNTER — Telehealth: Payer: Self-pay | Admitting: Family Medicine

## 2021-08-22 ENCOUNTER — Encounter: Payer: Self-pay | Admitting: Family Medicine

## 2021-08-22 ENCOUNTER — Ambulatory Visit (INDEPENDENT_AMBULATORY_CARE_PROVIDER_SITE_OTHER): Payer: PPO | Admitting: Family Medicine

## 2021-08-22 ENCOUNTER — Encounter (HOSPITAL_BASED_OUTPATIENT_CLINIC_OR_DEPARTMENT_OTHER): Payer: Self-pay

## 2021-08-22 ENCOUNTER — Emergency Department (HOSPITAL_BASED_OUTPATIENT_CLINIC_OR_DEPARTMENT_OTHER)
Admission: EM | Admit: 2021-08-22 | Discharge: 2021-08-22 | Disposition: A | Discharge status: Home / Self Care | Payer: PPO | Attending: Emergency Medicine | Admitting: Emergency Medicine

## 2021-08-22 VITALS — BP 106/64 | HR 62 | Temp 98.2°F | Ht 70.0 in | Wt 160.0 lb

## 2021-08-22 DIAGNOSIS — R55 Syncope and collapse: Secondary | ICD-10-CM | POA: Diagnosis not present

## 2021-08-22 DIAGNOSIS — I4891 Unspecified atrial fibrillation: Secondary | ICD-10-CM | POA: Diagnosis not present

## 2021-08-22 DIAGNOSIS — Z7901 Long term (current) use of anticoagulants: Secondary | ICD-10-CM | POA: Insufficient documentation

## 2021-08-22 DIAGNOSIS — R519 Headache, unspecified: Secondary | ICD-10-CM | POA: Diagnosis not present

## 2021-08-22 DIAGNOSIS — Y92007 Garden or yard of unspecified non-institutional (private) residence as the place of occurrence of the external cause: Secondary | ICD-10-CM | POA: Insufficient documentation

## 2021-08-22 DIAGNOSIS — W010XXA Fall on same level from slipping, tripping and stumbling without subsequent striking against object, initial encounter: Secondary | ICD-10-CM | POA: Diagnosis not present

## 2021-08-22 DIAGNOSIS — M47812 Spondylosis without myelopathy or radiculopathy, cervical region: Secondary | ICD-10-CM | POA: Diagnosis not present

## 2021-08-22 LAB — CBC
HCT: 40.9 % (ref 39.0–52.0)
Hemoglobin: 13.7 g/dL (ref 13.0–17.0)
MCH: 31.4 pg (ref 26.0–34.0)
MCHC: 33.5 g/dL (ref 30.0–36.0)
MCV: 93.6 fL (ref 80.0–100.0)
Platelets: 161 10*3/uL (ref 150–400)
RBC: 4.37 MIL/uL (ref 4.22–5.81)
RDW: 13.6 % (ref 11.5–15.5)
WBC: 5.8 10*3/uL (ref 4.0–10.5)
nRBC: 0 % (ref 0.0–0.2)

## 2021-08-22 LAB — URINALYSIS, ROUTINE W REFLEX MICROSCOPIC
Bilirubin Urine: NEGATIVE
Glucose, UA: NEGATIVE mg/dL
Ketones, ur: NEGATIVE mg/dL
Leukocytes,Ua: NEGATIVE
Nitrite: NEGATIVE
Specific Gravity, Urine: 1.012 (ref 1.005–1.030)
pH: 5 (ref 5.0–8.0)

## 2021-08-22 LAB — BASIC METABOLIC PANEL
Anion gap: 8 (ref 5–15)
BUN: 21 mg/dL (ref 8–23)
CO2: 29 mmol/L (ref 22–32)
Calcium: 9 mg/dL (ref 8.9–10.3)
Chloride: 103 mmol/L (ref 98–111)
Creatinine, Ser: 1.56 mg/dL — ABNORMAL HIGH (ref 0.61–1.24)
GFR, Estimated: 43 mL/min — ABNORMAL LOW (ref >60)
Glucose, Bld: 105 mg/dL — ABNORMAL HIGH (ref 70–99)
Potassium: 3.8 mmol/L (ref 3.5–5.1)
Sodium: 140 mmol/L (ref 135–145)

## 2021-08-22 LAB — CBG MONITORING, ED: Glucose-Capillary: 98 mg/dL (ref 70–99)

## 2021-08-22 NOTE — ED Notes (Signed)
Pt put on clothes and started to walk out the door without letting anyone he was leaving. Pt stated that he wanted to go home that he couldn't drive home in the dark and he needed to go. Pt is oriented x 4. Md notified

## 2021-08-22 NOTE — ED Notes (Signed)
Md spoke to pt. Pt able to give address to where he lives and followed instruction without any problems

## 2021-08-22 NOTE — Progress Notes (Signed)
[facility] at [place], and given report to doctor Horton about pt and inform them that pt is on his way to ER now.

## 2021-08-22 NOTE — Telephone Encounter (Signed)
Pt refused to call 911, will try to schedule a fu   Patient Name: Calvin Burnett Gender: Male DOB: 1934/01/30 Age: 88 Y 75 M 21 D Return Phone Number: [phone] (Primary) Address: City/ State/ Zip: [address] [place]  [address] Client [facility] Provider [name]- MD Contact Type Call Who Is Calling Patient / Member / Family / Caregiver Call Type Triage / Clinical Relationship To Patient Self Return Phone Number [phone] (Primary) Chief Complaint Blood Pressure Low Reason for Call Symptomatic / Request for [place] states he fell about an hour ago, he is worried about his blood pressure being low.He has some cuts on his arm from falling. Translation No Nurse Assessment Nurse: Louretta Shorten, RN, [place] Date/Time Eilene Ghazi Time): 08/21/2021 3:16:50 PM Confirm and document reason for call. If symptomatic, describe symptoms. ---Caller states he fell approx 1 hour ago-caller states he had a + LOC. Does the patient have any new or worsening symptoms? ---Yes Will a triage be completed? ---Yes Related visit to physician within the last 2 weeks? ---No Does the PT have any chronic conditions? (i.e. diabetes, asthma, this includes High risk factors for pregnancy, etc.) ---Yes List chronic conditions. ---on blood thinners Is this a behavioral health or substance abuse call? ---No Guidelines Guideline Title Affirmed Question Affirmed Notes Nurse Date/Time (Eastern Time) Head Injury Sounds like a life-threatening emergency to the triager Louretta Shorten, [place], [place] 08/21/2021 3:18:06 PM Disp. Time Eilene Ghazi Time) Disposition Final User 08/21/2021 3:26:59 PM 911 Outcome Documentation Louretta Shorten, RN, [place] Disp. Time Eilene Ghazi Time) Disposition Final User Reason: Caller refused to call 911. 08/21/2021 3:20:20 PM Call EMS 911 Now Yes Louretta Shorten, RN, [place] Caller  Disagree/Comply Comply Caller Understands Yes PreDisposition InappropriateToAsk Care Advice Given Per Guideline CALL EMS 911 NOW: * Immediate medical attention is needed. You need to hang up and call 911 (or an ambulance). * Triager Discretion: I'll call you back in a few minutes to be sure you were able to reach them. Comments User: [place], [place], RN Date/Time Eilene Ghazi Time): 08/21/2021 3:22:00 PM RN upgraded due to severity of pt's sxs.- caller alone, fell with + LOC, and on blood thinners. User: [place], [place], RN Date/Time Eilene Ghazi Time): 08/21/2021 3:28:44 PM Caller refused to call 911- requesting appt with his PCP. User: [place], [place], RN Date/Time Eilene Ghazi Time): 08/21/2021 3:29:43 PM Unable to reach anyone on backline. User: [place], [place], RN Date/Time Eilene Ghazi Time): 08/21/2021 3:40:18 PM Spoke with Alyse Low on main office line to inform her of Pt refusal to call 911. Referrals GO TO FACILITY UNDECIDED

## 2021-08-22 NOTE — Patient Instructions (Signed)
To ER as syncope and on eliquis and mild HA

## 2021-08-22 NOTE — ED Notes (Signed)
Patient transported to CT 

## 2021-08-22 NOTE — Telephone Encounter (Signed)
Pt walked in this afternoon to the office. I triaged him, his BP is 106/64, and complaining of headache and sore shoulder. Consulted with Dr. Cherlynn Kaiser who agreed to add him to her schedule to be evaluated.

## 2021-08-22 NOTE — ED Triage Notes (Signed)
Pt presents to ED POV from PCP's office for an "xray of my head". Pt reports he had been outside today raking leaves approx 1.5 hours, decided to take a break, set down on his porch and "passed out" upon standing. Pt thinks he was unconscious for a little bit but is unsure., he remembers sitting down and passing out and then remembers waking up with a stepping post on top of him. Pt is unsure if he hit his head. Pt takes Elaquis. PCP sent him here for a CT of his head.   Pt a/o x4, no facial droop, grips and strengths equal bilateral, no arm drift noted, answers questions appropriately

## 2021-08-22 NOTE — ED Provider Notes (Addendum)
[facility] EMERGENCY DEPT  Provider Note  CSN: [id number] Arrival date & time: 08/22/21 1424  History Chief Complaint  Patient presents with   Fall   Loss of Consciousness    Calvin Burnett is a 88 y.o. male with history of afib on Eliquis reports he had a syncopal episode at home yesterday afternoon. He had been raking leaves when he went to sit down on the porch for about 30-45 minutes. When he stood up to go back into the yard he had a loss of consciousness, fell over onto the ground. Woke up with a piece of the porch rail on top of him. He does not think he hit his head but feels 'funny' in his forehead. He went to his PCP office and sent to the ED for evaluation. He has been feeling fine otherwise today. Has not complaints at the time of my evaluation and wants to go home.    Home Medications Prior to Admission medications   Medication Sig Start Date End Date Taking? Authorizing Provider  apixaban (ELIQUIS) 2.5 MG TABS tablet Take 1 tablet (2.5 mg total) by mouth 2 (two) times daily. 04/05/21   Marin Olp, MD  blood glucose meter kit and supplies KIT Dispense based on patient and insurance preference. Use up to four times daily as directed. Dx E11.9 03/10/20   Marin Olp, MD  cyanocobalamin 1000 MCG tablet Take 1,000 mcg by mouth daily.    [provider]  latanoprost (XALATAN) 0.005 % ophthalmic solution Place 1 drop into both eyes at bedtime.  08/05/15   [provider]  memantine (NAMENDA XR) 28 MG CP24 24 hr capsule Take 1 capsule (28 mg total) by mouth daily. 08/16/21 08/11/22  Rondel Jumbo, PA-C  metFORMIN (GLUCOPHAGE-XR) 500 MG 24 hr tablet TAKE 1 TABLET BY MOUTH EVERY DAY WITH BREAKFAST 07/11/21   Marin Olp, MD  metoprolol succinate (TOPROL-XL) 25 MG 24 hr tablet TAKE 1/2 TABLET BY MOUTH EVERY DAY 06/11/21   Marin Olp, MD  rosuvastatin (CRESTOR) 20 MG tablet Take 1 tablet (20 mg total) by mouth once a week. 04/05/21    Marin Olp, MD     Allergies    Kcentra [prothrombin complex conc human]   Review of Systems   Review of Systems Please see HPI for pertinent positives and negatives  Physical Exam BP (!) 129/96    Pulse 72    Temp 98.1 F (36.7 C)    Resp 14    SpO2 99%   Physical Exam Vitals and nursing note reviewed.  Constitutional:      Appearance: Normal appearance.  HENT:     Head: Normocephalic and atraumatic.     Nose: Nose normal.     Mouth/Throat:     Mouth: Mucous membranes are moist.  Eyes:     Extraocular Movements: Extraocular movements intact.     Conjunctiva/sclera: Conjunctivae normal.  Cardiovascular:     Rate and Rhythm: Normal rate.  Pulmonary:     Effort: Pulmonary effort is normal.     Breath sounds: Normal breath sounds.  Abdominal:     General: Abdomen is flat.     Palpations: Abdomen is soft.     Tenderness: There is no abdominal tenderness.  Musculoskeletal:        General: No swelling. Normal range of motion.     Cervical back: Neck supple. No tenderness.  Skin:    General: Skin is warm and dry.  Neurological:     General: No focal deficit present.     Mental Status: He is alert.  Psychiatric:        Mood and Affect: Mood normal.    ED Results / Procedures / Treatments   EKG EKG Interpretation  Date/Time:  Wednesday August 22 2021 14:48:26 EST Ventricular Rate:  46 PR Interval:    QRS Duration: 90 QT Interval:  440 QTC Calculation: 385 R Axis:   72 Text Interpretation: Atrial fibrillation with slow ventricular response Abnormal ECG When compared with ECG of 14-Dec-2019 19:56, No significant change was found Confirmed by Calvert Cantor [phone]) on 08/22/2021 5:20:44 PM  Procedures Procedures  Medications Ordered in the ED Medications - No data to display  Initial Impression and Plan  Patient with history of afib, here with slow rate at times and a syncopal episode yesterday. He is back to baseline now. Labs and imaging done in  triage reviewed, normal CBC, BMP with CKD at baseline. UA is neg. EKG with chronic afib. CT neg for acute injury. Patient is eager to go home requesting discharge. Advised that we do not know definite cause of syncope but he feels well today and does not want to stay for further evaluation or consideration of admission. Recommend he follow up with PCP and [facility]ED for any worsening or worrisome symptoms.   ED Course       MDM Rules/Calculators/A&P Medical Decision Making Problems Addressed: Syncope, unspecified syncope type: acute illness or injury that poses a threat to life or bodily functions  Amount and/or Complexity of Data Reviewed Labs: ordered. Decision-making details documented in ED Course. Radiology: ordered and independent interpretation performed. Decision-making details documented in ED Course. ECG/medicine tests: ordered and independent interpretation performed. Decision-making details documented in ED Course.  Risk Decision regarding hospitalization.    Final Clinical Impression(s) / ED Diagnoses Final diagnoses:  Syncope, unspecified syncope type    Rx / DC Orders ED Discharge Orders     None        Truddie Hidden, MD 08/22/21 1722    Truddie Hidden, MD 08/22/21 1723
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
## 2021-08-22 NOTE — Telephone Encounter (Signed)
Patient walked into the office and was seen by Dr. Cherlynn Kaiser on 08/22/2021 to discuss concerns.

## 2021-08-29 DIAGNOSIS — Z8546 Personal history of malignant neoplasm of prostate: Secondary | ICD-10-CM | POA: Diagnosis not present

## 2021-08-29 DIAGNOSIS — R310 Gross hematuria: Secondary | ICD-10-CM | POA: Diagnosis not present

## 2021-09-17 DIAGNOSIS — I480 Paroxysmal atrial fibrillation: Secondary | ICD-10-CM | POA: Diagnosis not present

## 2021-09-17 DIAGNOSIS — I10 Essential (primary) hypertension: Secondary | ICD-10-CM | POA: Diagnosis not present

## 2021-09-17 DIAGNOSIS — Z7984 Long term (current) use of oral hypoglycemic drugs: Secondary | ICD-10-CM | POA: Diagnosis not present

## 2021-09-17 DIAGNOSIS — D6869 Other thrombophilia: Secondary | ICD-10-CM | POA: Diagnosis not present

## 2021-09-17 DIAGNOSIS — Z7901 Long term (current) use of anticoagulants: Secondary | ICD-10-CM | POA: Diagnosis not present

## 2021-09-17 DIAGNOSIS — E119 Type 2 diabetes mellitus without complications: Secondary | ICD-10-CM | POA: Diagnosis not present

## 2021-12-05 ENCOUNTER — Encounter (HOSPITAL_BASED_OUTPATIENT_CLINIC_OR_DEPARTMENT_OTHER): Payer: Self-pay | Admitting: Physical Therapy

## 2021-12-06 ENCOUNTER — Ambulatory Visit (INDEPENDENT_AMBULATORY_CARE_PROVIDER_SITE_OTHER): Payer: PPO | Admitting: Family Medicine

## 2021-12-06 ENCOUNTER — Encounter: Payer: Self-pay | Admitting: Family Medicine

## 2021-12-06 VITALS — BP 120/64 | HR 53 | Temp 97.7°F | Ht 70.0 in | Wt 162.6 lb

## 2021-12-06 DIAGNOSIS — N183 Chronic kidney disease, stage 3 unspecified: Secondary | ICD-10-CM | POA: Diagnosis not present

## 2021-12-06 DIAGNOSIS — I10 Essential (primary) hypertension: Secondary | ICD-10-CM | POA: Diagnosis not present

## 2021-12-06 DIAGNOSIS — E1169 Type 2 diabetes mellitus with other specified complication: Secondary | ICD-10-CM | POA: Diagnosis not present

## 2021-12-06 DIAGNOSIS — E1122 Type 2 diabetes mellitus with diabetic chronic kidney disease: Secondary | ICD-10-CM | POA: Diagnosis not present

## 2021-12-06 DIAGNOSIS — E785 Hyperlipidemia, unspecified: Secondary | ICD-10-CM

## 2021-12-06 DIAGNOSIS — I480 Paroxysmal atrial fibrillation: Secondary | ICD-10-CM

## 2021-12-06 DIAGNOSIS — F01B0 Vascular dementia, moderate, without behavioral disturbance, psychotic disturbance, mood disturbance, and anxiety: Secondary | ICD-10-CM

## 2021-12-06 LAB — CBC WITH DIFFERENTIAL/PLATELET
Basophils Absolute: 0 10*3/uL (ref 0.0–0.1)
Basophils Relative: 0.8 % (ref 0.0–3.0)
Eosinophils Absolute: 0.1 10*3/uL (ref 0.0–0.7)
Eosinophils Relative: 2.7 % (ref 0.0–5.0)
HCT: 40.7 % (ref 39.0–52.0)
Hemoglobin: 13.6 g/dL (ref 13.0–17.0)
Lymphocytes Relative: 26.9 % (ref 12.0–46.0)
Lymphs Abs: 1.4 10*3/uL (ref 0.7–4.0)
MCHC: 33.5 g/dL (ref 30.0–36.0)
MCV: 96.2 fl (ref 78.0–100.0)
Monocytes Absolute: 0.4 10*3/uL (ref 0.1–1.0)
Monocytes Relative: 7.2 % (ref 3.0–12.0)
Neutro Abs: 3.2 10*3/uL (ref 1.4–7.7)
Neutrophils Relative %: 62.4 % (ref 43.0–77.0)
Platelets: 173 10*3/uL (ref 150.0–400.0)
RBC: 4.23 Mil/uL (ref 4.22–5.81)
RDW: 14.6 % (ref 11.5–15.5)
WBC: 5.1 10*3/uL (ref 4.0–10.5)

## 2021-12-06 LAB — COMPREHENSIVE METABOLIC PANEL
ALT: 20 U/L (ref 0–53)
AST: 23 U/L (ref 0–37)
Albumin: 4.1 g/dL (ref 3.5–5.2)
Alkaline Phosphatase: 89 U/L (ref 39–117)
BUN: 20 mg/dL (ref 6–23)
CO2: 30 mEq/L (ref 19–32)
Calcium: 9.7 mg/dL (ref 8.4–10.5)
Chloride: 105 mEq/L (ref 96–112)
Creatinine, Ser: 1.32 mg/dL (ref 0.40–1.50)
GFR: 49.01 mL/min — ABNORMAL LOW (ref >60.00)
Glucose, Bld: 168 mg/dL — ABNORMAL HIGH (ref 70–99)
Potassium: 5 mEq/L (ref 3.5–5.1)
Sodium: 142 mEq/L (ref 135–145)
Total Bilirubin: 0.6 mg/dL (ref 0.2–1.2)
Total Protein: 7.7 g/dL (ref 6.0–8.3)

## 2021-12-06 LAB — MICROALBUMIN / CREATININE URINE RATIO
Creatinine,U: 95.6 mg/dL
Microalb Creat Ratio: 27.5 mg/g (ref 0.0–30.0)
Microalb, Ur: 26.3 mg/dL — ABNORMAL HIGH (ref 0.0–1.9)

## 2021-12-06 LAB — HEMOGLOBIN A1C: Hgb A1c MFr Bld: 6.4 % (ref 4.6–6.5)

## 2021-12-06 NOTE — Patient Instructions (Addendum)
Also see if pharmacy can give [place] date of last covid shot- we have only 2021 dates  Check with your pharmacy- we only have one shingles shot on 07/16/2017 on file - cheaper to do shot at pharmacy  Please stop by lab before you go If you have mychart- we will send your results within 3 business days of [place] receiving them.  If you do not have mychart- we will call you about results within 5 business days of [place] receiving them.  *please also note that you will see labs on mychart as soon as they post. I will later go in and write notes on them- will say "notes from [name]"   Recommended follow up: Return in about 4 months (around 04/08/2022) for physical or sooner if needed.Schedule b4 you leave.

## 2021-12-06 NOTE — Progress Notes (Signed)
Phone [phone] In person visit   Subjective:   Calvin Burnett is a 88 y.o. year old very pleasant male patient who presents for/with See problem oriented charting Chief Complaint  Patient presents with   Follow-up   Hypertension   Diabetes    Past Medical History-  Patient Active Problem List   Diagnosis Date Noted   Intra-abdominal abscess ([place]) 04/24/2019    Priority: High   B12 deficiency 12/04/2017    Priority: High   pT1pN0 colon cancer s/p robotic right colectomy 11/26/2017 10/02/2017    Priority: High   AF (paroxysmal atrial fibrillation) ([place]) 04/09/2017    Priority: High   Night sweats 09/16/2016    Priority: High   Major depressive disorder with single episode, in full remission ([place]) 07/29/2016    Priority: High   Rectal pain 10/14/2014    Priority: High   Prostate cancer ([place]) 08/12/2012    Priority: High   Type 2 diabetes mellitus ([place]) 07/31/2010    Priority: High   Vascular dementia ([place]) 07/27/2008    Priority: High   Mild aortic stenosis 04/05/2021    Priority: Medium    BPPV (benign paroxysmal positional vertigo) 12/17/2019    Priority: Medium    History of adenomatous polyp of colon 10/27/2014    Priority: Medium    Syncope 09/27/2014    Priority: Medium    CKD (chronic kidney disease), stage III ([place]) 06/01/2014    Priority: Medium    Hyperlipidemia associated with type 2 diabetes mellitus ([place]) 03/01/2014    Priority: Medium    Basal cell carcinoma 05/03/2009    Priority: Medium    Essential hypertension 01/30/2007    Priority: Medium    Gout 10/28/2019    Priority: Low   Aortic atherosclerosis ([place]) 01/29/2019    Priority: Low   Stricture of sigmoid s/p robotic sigmoidectomy 11/26/2017 11/26/2017    Priority: Low   Chronic anticoagulation 11/26/2017    Priority: Low   Right inguinal hernia 10/02/2017    Priority: Low   Bowel habit changes 10/14/2014    Priority: Low   Bradycardia 09/27/2014    Priority: Low   LEG CRAMPS,  NOCTURNAL 04/30/2010    Priority: Low   Cervical spondylosis without myelopathy 12/11/2007    Priority: Low   GERD (gastroesophageal reflux disease) 11/05/2007    Priority: Low    Medications- reviewed and updated Current Outpatient Medications  Medication Sig Dispense Refill   apixaban (ELIQUIS) 2.5 MG TABS tablet Take 1 tablet (2.5 mg total) by mouth 2 (two) times daily. 180 tablet 3   blood glucose meter kit and supplies KIT Dispense based on patient and insurance preference. Use up to four times daily as directed. Dx E11.9 1 each 0   cyanocobalamin 1000 MCG tablet Take 1,000 mcg by mouth daily.     latanoprost (XALATAN) 0.005 % ophthalmic solution Place 1 drop into both eyes at bedtime.   12   memantine (NAMENDA XR) 28 MG CP24 24 hr capsule Take 1 capsule (28 mg total) by mouth daily. 30 capsule 11   metFORMIN (GLUCOPHAGE-XR) 500 MG 24 hr tablet TAKE 1 TABLET BY MOUTH EVERY DAY WITH BREAKFAST 30 tablet 5   metoprolol succinate (TOPROL-XL) 25 MG 24 hr tablet TAKE 1/2 TABLET BY MOUTH EVERY DAY 45 tablet 1   rosuvastatin (CRESTOR) 20 MG tablet Take 1 tablet (20 mg total) by mouth once a week. 13 tablet 3   No current facility-administered medications for this visit.  Objective:  BP 120/64   Pulse (!) 53   Temp 97.7 F (36.5 C)   Ht 5' 10"  (1.778 m)   Wt 162 lb 9.6 oz (73.8 kg)   SpO2 100%   BMI 23.33 kg/m  Gen: NAD, resting comfortably CV: RRR no murmurs rubs or gallops Lungs: CTAB no crackles, wheeze, rhonchi Ext: minimal edema Skin: warm, dry    Assessment and Plan    #Vascular dementia- Dr. Delice Lesch S: Patient is compliant with memantine/Namenda 51m.  Patient has been on memantine for many years.   - did have what sounds to be orthostatic hypotension and syncopal episode. CT head and neck reassuring thankfully at that time and discharged home A/P: dementia stable recently. Continue current meds. Saw neurology 08/16/21.  -still driving within 5-6 miles of house- not  getting lost   #Atrial fibrillation S: Patient is anticoagulated with Eliquis 2.5 mg twice daily as well as rate controlled with metoprolol 12.5 mg extended release A/P: Appropriately anticoagulated and rate controlled-continue current medication  # Diabetes S: Compliant withMetformin 500 mg XR daily with breakfast Exercise and diet- walking 2 miles most days if nice  Lab Results  Component Value Date   HGBA1C 6.5 07/17/2021   HGBA1C 6.6 (H) 04/05/2021   HGBA1C 6.7 (H) 11/23/2020  A/P: hopefully stable- update a1c today. Continue current meds for now  #hyperlipidemia/history of stroke-may have been atrial fibrillation related S: compliant with rosuvastatin 20 mg Lab Results  Component Value Date   CHOL 113 04/05/2021   HDL 37.70 (L) 04/05/2021   LDLCALC 57 04/05/2021   LDLDIRECT 57.0 11/23/2020   TRIG 91.0 04/05/2021   CHOLHDL 3 04/05/2021   A/P: Excellent control last check-LDL under 70 which is ideal goal given history of stroke   #hypertension/CKD stage III S: compliant with  metoprolol 12.5 mg extended release -Also stopped amlodipine 2.5729m- stopped hctz 12.29m38mue to lower BP and dizziness.   CKD stage III with creatinine ranging from 1.2-1.6 for the most part.  Night sweats in the past on ramipril and urine microalbumin to creatinine ratio has not been elevated so we have not restarted ACE inhibitor  BP Readings from Last 3 Encounters:  12/06/21 120/64  08/22/21 (!) 141/77  08/22/21 106/64   A/P: Excellent control of blood pressure-continue current medication.  Chronic kidney disease stage III also stable-continue current medications  # B12 deficiency S: Current treatment/medication (oral vs. IM): Oral B12 1000 mcg Lab Results  Component Value Date   VITAMINB12 886 04/05/2021  A/P: Well-controlled on last check-continue current medication  #Aortic stenosis mild in 2020-we will plan to recheck next visit   #hematuria history- saw Dr. DahDiona Fantid had PSA on  08/30/21 and also reports reassuring hematuria workup  Recommended follow up: Return in about 4 months (around 04/08/2022) for physical or sooner if needed.Schedule b4 you leave. Future Appointments  Date Time Provider DepGlasco/09/2021  9:30 AM WerRondel JumboA-C [facility] None    Lab/Order associations:   ICD-10-CM   1. AF (paroxysmal atrial fibrillation) ([name])  I48.0     2. Type 2 diabetes mellitus with chronic kidney disease, without long-term current use of insulin, unspecified CKD stage ([name])  E11.22     3. Moderate vascular dementia without behavioral disturbance, psychotic disturbance, mood disturbance, or anxiety (HCCColonaF01.B0     4. Hyperlipidemia associated with type 2 diabetes mellitus (HCCSkamaniaE11.69    E78.5     5. Essential hypertension  I10  6. Stage 3 chronic kidney disease, unspecified whether stage 3a or 3b CKD ([place])  N18.30       No orders of the defined types were placed in this encounter.   Return precautions advised.  [name], MD

## 2021-12-17 DIAGNOSIS — E119 Type 2 diabetes mellitus without complications: Secondary | ICD-10-CM | POA: Diagnosis not present

## 2021-12-17 DIAGNOSIS — Z7984 Long term (current) use of oral hypoglycemic drugs: Secondary | ICD-10-CM | POA: Diagnosis not present

## 2021-12-24 DIAGNOSIS — H401132 Primary open-angle glaucoma, bilateral, moderate stage: Secondary | ICD-10-CM | POA: Diagnosis not present

## 2021-12-24 DIAGNOSIS — H3562 Retinal hemorrhage, left eye: Secondary | ICD-10-CM | POA: Diagnosis not present

## 2021-12-25 DIAGNOSIS — Z7984 Long term (current) use of oral hypoglycemic drugs: Secondary | ICD-10-CM | POA: Diagnosis not present

## 2021-12-25 DIAGNOSIS — E119 Type 2 diabetes mellitus without complications: Secondary | ICD-10-CM | POA: Diagnosis not present

## 2021-12-25 DIAGNOSIS — H3581 Retinal edema: Secondary | ICD-10-CM | POA: Diagnosis not present

## 2021-12-25 DIAGNOSIS — Z961 Presence of intraocular lens: Secondary | ICD-10-CM | POA: Diagnosis not present

## 2021-12-25 DIAGNOSIS — H353221 Exudative age-related macular degeneration, left eye, with active choroidal neovascularization: Secondary | ICD-10-CM | POA: Diagnosis not present

## 2021-12-25 DIAGNOSIS — H26492 Other secondary cataract, left eye: Secondary | ICD-10-CM | POA: Diagnosis not present

## 2021-12-25 DIAGNOSIS — H353112 Nonexudative age-related macular degeneration, right eye, intermediate dry stage: Secondary | ICD-10-CM | POA: Diagnosis not present

## 2021-12-26 ENCOUNTER — Other Ambulatory Visit: Payer: Self-pay | Admitting: Family Medicine

## 2021-12-27 ENCOUNTER — Other Ambulatory Visit: Payer: Self-pay | Admitting: Family Medicine

## 2021-12-28 ENCOUNTER — Other Ambulatory Visit: Payer: Self-pay | Admitting: Family Medicine

## 2022-01-08 ENCOUNTER — Other Ambulatory Visit: Payer: Self-pay | Admitting: Family Medicine

## 2022-01-09 ENCOUNTER — Telehealth: Payer: Self-pay | Admitting: Family Medicine

## 2022-01-11 NOTE — Telephone Encounter (Signed)
Patient came in person in regards to having Amlodipine refilled. Upon last OV note on 05/25, pt stopped the medication. Please advise.

## 2022-01-22 DIAGNOSIS — H353221 Exudative age-related macular degeneration, left eye, with active choroidal neovascularization: Secondary | ICD-10-CM | POA: Diagnosis not present

## 2022-02-13 NOTE — Progress Notes (Signed)
Assessment/Plan:   Vascular Dementia, mild  Calvin Burnett is a very pleasant 88 y.o. RH male with a history of Afib on Eliquis, DM2, hyperlipidemia, HTN, CKD3, B12 deficiency, vascular dementia seen today in follow up for memory loss. Patient is currently on memantine XR 28 mg daily TD tolerating well.  MMSE today is 20/30, with delayed recall 0, orientation 1/5.   Recommendations:    Continue memantine XR 28 mg daily TD (history of bradycardia with ACHI) side effects were discussed Continue to control cardiovascular risk factors. Follow up in 6  months.   Case discussed with Dr. Delice Lesch who agrees with the plan     Subjective:    This patient is accompanied in the office by his daughter who supplements the history.  Previous records as well as any outside records available were reviewed prior to todays visit. Patient was last seen at our office on 08/16/2021.  He is on memantine XR 28 mg TD, tolerating well   Any changes in memory since last visit? No changes .  Although he does not do crossword puzzles or word finding, he enjoys watching his game shows.  His long-term memory is normal. Patient lives with: Alone with good family support.  He is daughter lives in [place] and call since 3 or 4 times a day, and the youngest son takes care of him and he lives locally. repeats oneself?  Denies  Disoriented when walking into a room?  Patient denies   Leaving objects in unusual places?  He does misplace things, not in unusual places, but continues to look for them even when they are in front of him.  Sometimes he gets frustrated when he cannot find something "he is too hard on himself ".  Ambulates  with difficulty?   Patient denies   Recent falls?  Endorsed.  The patient had if fall on 08/22/2021 due to syncope while working on the yard. He did sustain head injury with negative CT for acute injury, no hospitalization was required. He also fell a couple of months ago and hit his L elbow   without LOC.  History of seizures?   Patient denies   Wandering behavior?  Patient denies   Patient drives?   No issues Any mood changes?  Patient denies   Any history of depression?:  Patient denies   Hallucinations?  Patient denies   Paranoia?  Patient denies   Patient reports that he sleeps well without vivid dreams, REM behavior or sleepwalking   History of sleep apnea?  Patient denies   Any hygiene concerns?  Patient denies   Independent of bathing and dressing?  Endorsed  Does the patient needs help with medications?  Patient is in charge.   Who is in charge of the finances?   Patient is in charge    Any changes in appetite?  Patient denies.  He likes going to [employer] for his morning food. Then to [employer] for lunch sometimes with his friend.  Patient have trouble swallowing? Patient denies   Does the patient cook?  Patient denies   Any kitchen accidents such as leaving the stove on? Patient denies   Any headaches?  Patient denies   Double vision? Patient denies.  He has a chronic vision loss on the left, and "he takes injections for that with his eye doctor " Any focal numbness or tingling?  Patient denies   Chronic back pain Patient denies   Unilateral weakness?  Patient denies  Any tremors?  Patient denies   Any history of anosmia?  Patient denies   Any incontinence of urine?  Patient denies   Any bowel dysfunction?   Patient denies       History on Initial Assessment 05/25/2020: This is a pleasant 88 year old right-handed man with a history of hypertension, hyperlipidemia, diabetes, atrial fibrillation on Eliquis, stroke, presenting for evaluation of vascular dementia. He is accompanied by his daughter Calvin Burnett who helps supplement the history today. He feels his memory is pretty good. Calvin Burnett started noticing short-term memory changes around 2-3 years ago, but feels that he is overall doing well independently. He lives alone. He has a good family support system  with several family members locally checking on his regularly. His wife passed away 5 years ago and he was under a lot of stress. There have been several deaths in the family and he has been through a lot. He denies getting lost driving, Calvin Burnett denies any driving concerns. He manages his own medications and finances, he and Calvin Burnett deny any issues with these. 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  He denies misplacing things frequently, Calvin Burnett reports he rarely misplaces things but gets really hard on himself when he loses things. He denies any word-finding difficulties. He does not cook much and denies leaving the stove on. He is independent with dressing and bathing, Calvin Burnett denies any hygiene concerns. His brother is his POA. No family history of dementia. He denies any significant head injuries or alcohol use. Notes reviewed, he was started on Namenda by Dr. Arnoldo Morale in 2015. MMSE 26/30 in 2017, 21/30 in 2019.    He denies any headaches, dizziness, diplopia, dysarthria, dysphagia, neck pain, focal numbness/tingling/weakness, bowel/bladder dysfunction. No anosmia, tremors, no falls.  He walks a mile a day. He has some back pain today, right leg feels different. Sleep is good. No personality changes, paranoia or hallucinations. He denies any known prior history of stroke. Calvin Burnett reports that he passed out while at [place], and this is when he had a stroke. EPIC notes reviewed, he had a syncopal episode in 2016 due to bradycardia. CT head at that time did not show any evidence of stroke. There was diffuse atrophy and chronic microvascular disease. He had an MRI brain with and without contrast in 11/2017 for memory loss, confusion. There was note of a remote right temporal lobe infarct with encephalomalacia that was not seen in 2016 imaging.  I personally reviewed MRI brain without contrast done 12/2019 which did not show any acute changes. There was a large remote right temporal  infarct, inferior division MCA territory, diffuse atrophy and mild chronic microvascular disease. There were remote hemorrhages along the right temporal occipital convexity without generalized chronic lobar hemorrhage, possible mild gliosis in the parasagittal or parietal lobes adjacent to the inferior falx.  PREVIOUS MEDICATIONS:   CURRENT MEDICATIONS:  Outpatient Encounter Medications as of 02/14/2022  Medication Sig   apixaban (ELIQUIS) 2.5 MG TABS tablet Take 1 tablet (2.5 mg total) by mouth 2 (two) times daily.   blood glucose meter kit and supplies KIT Dispense based on patient and insurance preference. Use up to four times daily as directed. Dx E11.9   cyanocobalamin 1000 MCG tablet Take 1,000 mcg by mouth daily.   latanoprost (XALATAN) 0.005 % ophthalmic solution Place 1 drop into both eyes at bedtime.    memantine (NAMENDA XR) 28 MG CP24 24 hr capsule Take 1 capsule (28 mg total)  by mouth daily.   metFORMIN (GLUCOPHAGE-XR) 500 MG 24 hr tablet TAKE 1 TABLET BY MOUTH EVERY DAY WITH BREAKFAST   metoprolol succinate (TOPROL-XL) 25 MG 24 hr tablet TAKE 1/2 TABLET BY MOUTH EVERY DAY   rosuvastatin (CRESTOR) 20 MG tablet Take 1 tablet (20 mg total) by mouth once a week.   No facility-administered encounter medications on file as of 02/14/2022.       02/14/2022   12:00 PM 02/08/2021    8:00 AM 03/05/2017   11:44 AM  MMSE - Mini Mental State Exam  Not completed:   Refused  Orientation to time 1 4   Orientation to Place 5 4   Registration 3 3   Attention/ Calculation 3 2   Recall 0 0   Language- name 2 objects 2 2   Language- repeat 1 1   Language- follow 3 step command 3 3   Language- read & follow direction 1 1   Write a sentence 1 1   Copy design 0 0   Total score 20 21       05/25/2020   10:00 AM  Montreal Cognitive Assessment   Visuospatial/ Executive (0/5) 3  Naming (0/3) 2  Attention: Read list of digits (0/2) 2  Attention: Read list of letters (0/1) 1  Attention: Serial  7 subtraction starting at 100 (0/3) 2  Language: Repeat phrase (0/2) 2  Language : Fluency (0/1) 1  Abstraction (0/2) 2  Delayed Recall (0/5) 0  Orientation (0/6) 4  Total 19  Adjusted Score (based on education) 20    Objective:     PHYSICAL EXAMINATION:    VITALS:   Vitals:   02/14/22 0912  BP: (!) 141/79  Pulse: 66  Resp: 20  SpO2: 97%  Weight: 162 lb (73.5 kg)    GEN:  The patient appears stated age and is in NAD. HEENT:  Normocephalic, atraumatic.   Neurological examination:  General: NAD, well-groomed, appears stated age. Orientation: The patient is alert. Oriented to person, place and not to date. Cranial nerves: There is good facial symmetry.The speech is fluent and clear. No aphasia or dysarthria. Fund of knowledge is appropriate. Recent and remote memory are impaired. Attention and concentration are normal.  Able to name objects and repeat phrases.  Hearing is intact to conversational tone.    Sensation: Sensation is intact to light touch throughout Motor: Strength is at least antigravity x4. Tremors: none  DTR's 2/4 in UE/LE     Movement examination: Tone: There is normal tone in the UE/LE Abnormal movements:  no tremor.  No myoclonus.  No asterixis.   Coordination:  There is no decremation with RAM's. Normal finger to nose  Gait and Station: The patient has no difficulty arising out of a deep-seated chair without the use of the hands. The patient's stride length is good.  Gait is cautious and narrow.    Thank you for allowing [place] the opportunity to participate in the care of this nice patient. Please do not hesitate to contact us for any questions or concerns.   Total time spent on today's visit was 34 minutes dedicated to this patient today, preparing to see patient, examining the patient, ordering tests and/or medications and counseling the patient, documenting clinical information in the EHR or other health record, independently interpreting results and  communicating results to the patient/family, discussing treatment and goals, answering patient's questions and coordinating care.  Cc:  Marin Olp, MD  Sharene Butters 02/14/2022 12:37 PM

## 2022-02-14 ENCOUNTER — Ambulatory Visit: Payer: PPO | Admitting: Physician Assistant

## 2022-02-14 ENCOUNTER — Encounter: Payer: Self-pay | Admitting: Physician Assistant

## 2022-02-14 ENCOUNTER — Ambulatory Visit: Payer: PPO | Admitting: Neurology

## 2022-02-14 VITALS — BP 141/79 | HR 66 | Resp 20 | Wt 162.0 lb

## 2022-02-14 DIAGNOSIS — F01A0 Vascular dementia, mild, without behavioral disturbance, psychotic disturbance, mood disturbance, and anxiety: Secondary | ICD-10-CM

## 2022-02-14 NOTE — Patient Instructions (Signed)
It was a pleasure to see you today at our office.   Recommendations:  Follow up in 6  months Continue Namenda 28 mg XR  Whom to call:  Memory  decline, memory medications: Call our office [phone]   For psychiatric meds, mood meds: Please have your primary care physician manage these medications.   Counseling regarding caregiver distress, including caregiver depression, anxiety and issues regarding community resources, adult day care programs, adult living facilities, or memory care questions:   Feel free to contact Belt, Social Worker at [phone]   For assessment of decision of mental capacity and competency:  Call Dr. Anthoney Harada, geriatric psychiatrist at [phone]  For guidance in geriatric dementia issues please call [facility] [phone]  For guidance regarding [facility] and if placement were needed at the facility, contact Arnell Asal, Social Worker tel: [phone]  If you have any severe symptoms of a stroke, or other severe issues such as confusion,severe chills or fever, etc call 911 or go to the ER as you may need to be evaluated further   Feel free to visit Facebook page " [employer]" for tips of how to care for people with memory problems.    Feel free to go to the following database for funded clinical studies conducted around the world: http://saunders.com/   https://www.triadclinicaltrials.com/     RECOMMENDATIONS FOR ALL PATIENTS WITH MEMORY PROBLEMS: 1. Continue to exercise (Recommend 30 minutes of walking everyday, or 3 hours every week) 2. Increase social interactions - continue going to [place] and enjoy social gatherings with friends and family 3. Eat healthy, avoid fried foods and eat more fruits and vegetables 4. Maintain adequate blood pressure, blood sugar, and blood cholesterol level. Reducing the risk of stroke and cardiovascular disease also helps promoting better memory. 5.  Avoid stressful situations. Live a simple life and avoid aggravations. Organize your time and prepare for the next day in anticipation. 6. Sleep well, avoid any interruptions of sleep and avoid any distractions in the bedroom that may interfere with adequate sleep quality 7. Avoid sugar, avoid sweets as there is a strong link between excessive sugar intake, diabetes, and cognitive impairment We discussed the Mediterranean diet, which has been shown to help patients reduce the risk of progressive memory disorders and reduces cardiovascular risk. This includes eating fish, eat fruits and green leafy vegetables, nuts like almonds and hazelnuts, walnuts, and also use olive oil. Avoid fast foods and fried foods as much as possible. Avoid sweets and sugar as sugar use has been linked to worsening of memory function.  There is always a concern of gradual progression of memory problems. If this is the case, then we may need to adjust level of care according to patient needs. Support, both to the patient and caregiver, should then be put into place.    FALL PRECAUTIONS: Be cautious when walking. Scan the area for obstacles that may increase the risk of trips and falls. When getting up in the mornings, sit up at the edge of the bed for a few minutes before getting out of bed. Consider elevating the bed at the head end to avoid drop of blood pressure when getting up. Walk always in a well-lit room (use night lights in the walls). Avoid area rugs or power cords from appliances in the middle of the walkways. Use a walker or a cane if necessary and consider physical therapy for balance exercise. Get your eyesight checked regularly.  FINANCIAL OVERSIGHT: Supervision,  especially oversight when making financial decisions or transactions is also recommended.  HOME SAFETY: Consider the safety of the kitchen when operating appliances like stoves, microwave oven, and blender. Consider having supervision and share cooking  responsibilities until no longer able to participate in those. Accidents with firearms and other hazards in the house should be identified and addressed as well.   ABILITY TO BE LEFT ALONE: If patient is unable to contact 911 operator, consider using LifeLine, or when the need is there, arrange for someone to stay with patients. Smoking is a fire hazard, consider supervision or cessation. Risk of wandering should be assessed by caregiver and if detected at any point, supervision and safe proof recommendations should be instituted.  MEDICATION SUPERVISION: Inability to self-administer medication needs to be constantly addressed. Implement a mechanism to ensure safe administration of the medications.   DRIVING: Regarding driving, in patients with progressive memory problems, driving will be impaired. We advise to have someone else do the driving if trouble finding directions or if minor accidents are reported. Independent driving assessment is available to determine safety of driving.   If you are interested in the driving assessment, you can contact the following:  [employer] in [place]  [place] [phone]  [place]  [facility] [phone] or [phone]

## 2022-02-19 DIAGNOSIS — H3581 Retinal edema: Secondary | ICD-10-CM | POA: Diagnosis not present

## 2022-02-19 DIAGNOSIS — H353221 Exudative age-related macular degeneration, left eye, with active choroidal neovascularization: Secondary | ICD-10-CM | POA: Diagnosis not present

## 2022-02-19 DIAGNOSIS — E119 Type 2 diabetes mellitus without complications: Secondary | ICD-10-CM | POA: Diagnosis not present

## 2022-02-19 DIAGNOSIS — H353112 Nonexudative age-related macular degeneration, right eye, intermediate dry stage: Secondary | ICD-10-CM | POA: Diagnosis not present

## 2022-02-19 DIAGNOSIS — H26492 Other secondary cataract, left eye: Secondary | ICD-10-CM | POA: Diagnosis not present

## 2022-02-19 DIAGNOSIS — Z961 Presence of intraocular lens: Secondary | ICD-10-CM | POA: Diagnosis not present

## 2022-02-19 DIAGNOSIS — Z7984 Long term (current) use of oral hypoglycemic drugs: Secondary | ICD-10-CM | POA: Diagnosis not present

## 2022-03-14 DIAGNOSIS — E119 Type 2 diabetes mellitus without complications: Secondary | ICD-10-CM | POA: Diagnosis not present

## 2022-03-19 DIAGNOSIS — H353221 Exudative age-related macular degeneration, left eye, with active choroidal neovascularization: Secondary | ICD-10-CM | POA: Diagnosis not present

## 2022-03-19 DIAGNOSIS — H26492 Other secondary cataract, left eye: Secondary | ICD-10-CM | POA: Diagnosis not present

## 2022-03-19 DIAGNOSIS — Z7984 Long term (current) use of oral hypoglycemic drugs: Secondary | ICD-10-CM | POA: Diagnosis not present

## 2022-03-19 DIAGNOSIS — E119 Type 2 diabetes mellitus without complications: Secondary | ICD-10-CM | POA: Diagnosis not present

## 2022-03-19 DIAGNOSIS — H3581 Retinal edema: Secondary | ICD-10-CM | POA: Diagnosis not present

## 2022-03-19 DIAGNOSIS — H353112 Nonexudative age-related macular degeneration, right eye, intermediate dry stage: Secondary | ICD-10-CM | POA: Diagnosis not present

## 2022-03-19 DIAGNOSIS — Z961 Presence of intraocular lens: Secondary | ICD-10-CM | POA: Diagnosis not present

## 2022-03-27 DIAGNOSIS — Z7984 Long term (current) use of oral hypoglycemic drugs: Secondary | ICD-10-CM | POA: Diagnosis not present

## 2022-03-27 DIAGNOSIS — F0150 Vascular dementia without behavioral disturbance: Secondary | ICD-10-CM | POA: Diagnosis not present

## 2022-03-27 DIAGNOSIS — E1122 Type 2 diabetes mellitus with diabetic chronic kidney disease: Secondary | ICD-10-CM | POA: Diagnosis not present

## 2022-03-27 DIAGNOSIS — Z6823 Body mass index (BMI) 23.0-23.9, adult: Secondary | ICD-10-CM | POA: Diagnosis not present

## 2022-03-27 DIAGNOSIS — N1831 Chronic kidney disease, stage 3a: Secondary | ICD-10-CM | POA: Diagnosis not present

## 2022-04-08 ENCOUNTER — Encounter: Payer: Self-pay | Admitting: *Deleted

## 2022-04-11 ENCOUNTER — Ambulatory Visit (INDEPENDENT_AMBULATORY_CARE_PROVIDER_SITE_OTHER): Payer: PPO

## 2022-04-11 DIAGNOSIS — Z23 Encounter for immunization: Secondary | ICD-10-CM | POA: Diagnosis not present

## 2022-04-16 DIAGNOSIS — H3581 Retinal edema: Secondary | ICD-10-CM | POA: Diagnosis not present

## 2022-04-16 DIAGNOSIS — Z961 Presence of intraocular lens: Secondary | ICD-10-CM | POA: Diagnosis not present

## 2022-04-16 DIAGNOSIS — H353112 Nonexudative age-related macular degeneration, right eye, intermediate dry stage: Secondary | ICD-10-CM | POA: Diagnosis not present

## 2022-04-16 DIAGNOSIS — H26492 Other secondary cataract, left eye: Secondary | ICD-10-CM | POA: Diagnosis not present

## 2022-04-16 DIAGNOSIS — E119 Type 2 diabetes mellitus without complications: Secondary | ICD-10-CM | POA: Diagnosis not present

## 2022-04-16 DIAGNOSIS — H353221 Exudative age-related macular degeneration, left eye, with active choroidal neovascularization: Secondary | ICD-10-CM | POA: Diagnosis not present

## 2022-05-01 ENCOUNTER — Telehealth: Payer: Self-pay | Admitting: *Deleted

## 2022-05-01 NOTE — Patient Outreach (Signed)
  Care Coordination   05/01/2022 Name: Calvin Burnett MRN: [id number] DOB: 11-28-1933   Care Coordination Outreach Attempts:  An unsuccessful telephone outreach was attempted today to offer the patient information about available care coordination services as a benefit of their health plan.   Follow Up Plan:  Additional outreach attempts will be made to offer the patient care coordination information and services.   Encounter Outcome:  No Answer  Care Coordination Interventions Activated:  No   Care Coordination Interventions:  No, not indicated    Raina Mina, RN Care Management Coordinator [place] Office [phone]

## 2022-05-02 ENCOUNTER — Other Ambulatory Visit: Payer: Self-pay | Admitting: Family Medicine

## 2022-05-07 ENCOUNTER — Encounter: Payer: Self-pay | Admitting: *Deleted

## 2022-05-07 ENCOUNTER — Telehealth: Payer: Self-pay | Admitting: *Deleted

## 2022-05-07 NOTE — Patient Outreach (Signed)
  Care Coordination   Initial Visit Note   05/07/2022 Name: Calvin Burnett MRN: [id number] DOB: [birth year]  Calvin Burnett is a 88 y.o. year old male who sees Hunter, Brayton Mars, MD for primary care. I spoke with  [name] by phone today.  What matters to the patients health and wellness today?  No needs    Goals Addressed               This Visit's Progress     COMPLETED: No needs (pt-stated)        Care Coordination Interventions: Advised patient to scheduled his AWV with his primary provider for this year. Reviewed medications with patient and discussed adherence to all his prescribed medications and verified no needed refills Reviewed scheduled/upcoming provider appointments including pending appointments Screening for signs and symptoms of depression related to chronic disease state  Assessed social determinant of health barriers          SDOH assessments and interventions completed:  Yes  SDOH Interventions Today    Flowsheet Row Most Recent Value  SDOH Interventions   Food Insecurity Interventions Intervention Not Indicated  Housing Interventions Intervention Not Indicated  Transportation Interventions Intervention Not Indicated  Utilities Interventions Intervention Not Indicated        Care Coordination Interventions Activated:  Yes  Care Coordination Interventions:  Yes, provided   Follow up plan: No further intervention required.   Encounter Outcome:  Pt. Visit Completed   Raina Mina, RN Care Management Coordinator [place] Office [phone]

## 2022-05-07 NOTE — Patient Instructions (Signed)
Visit Information  Thank you for taking time to visit with me today. Please don't hesitate to contact me if I can be of assistance to you.   Following are the goals we discussed today:   Goals Addressed               This Visit's Progress     COMPLETED: No needs (pt-stated)        Care Coordination Interventions: Advised patient to scheduled his AWV with his primary provider for this year. Reviewed medications with patient and discussed adherence to all his prescribed medications and verified no needed refills Reviewed scheduled/upcoming provider appointments including pending appointments Screening for signs and symptoms of depression related to chronic disease state  Assessed social determinant of health barriers            Please call the care guide team at [phone] if you need to cancel or reschedule your appointment.   If you are experiencing a Mental Health or [place] or need someone to talk to, please call the Suicide and Crisis Lifeline: 988 call the [place] National Suicide Prevention Lifeline: 201 133 9608 or TTY: (337)319-9811 TTY 336 191 3663) to talk to a trained counselor call 1-800-273-TALK (toll free, 24 hour hotline)  Patient verbalizes understanding of instructions and care plan provided today and agrees to view in [place]. Active MyChart status and patient understanding of how to access instructions and care plan via MyChart confirmed with patient.     No further follow up required: No needs   Raina Mina, RN Care Management Coordinator [place] Office [phone]

## 2022-05-24 ENCOUNTER — Other Ambulatory Visit: Payer: Self-pay | Admitting: Family Medicine

## 2022-05-30 ENCOUNTER — Ambulatory Visit (INDEPENDENT_AMBULATORY_CARE_PROVIDER_SITE_OTHER): Payer: PPO | Admitting: Family Medicine

## 2022-05-30 ENCOUNTER — Encounter: Payer: Self-pay | Admitting: Family Medicine

## 2022-05-30 VITALS — BP 128/72 | HR 44 | Temp 98.0°F | Ht 70.0 in | Wt 160.8 lb

## 2022-05-30 DIAGNOSIS — Z0000 Encounter for general adult medical examination without abnormal findings: Secondary | ICD-10-CM | POA: Diagnosis not present

## 2022-05-30 DIAGNOSIS — Z1283 Encounter for screening for malignant neoplasm of skin: Secondary | ICD-10-CM

## 2022-05-30 DIAGNOSIS — E1122 Type 2 diabetes mellitus with diabetic chronic kidney disease: Secondary | ICD-10-CM | POA: Diagnosis not present

## 2022-05-30 DIAGNOSIS — E1169 Type 2 diabetes mellitus with other specified complication: Secondary | ICD-10-CM | POA: Diagnosis not present

## 2022-05-30 DIAGNOSIS — I350 Nonrheumatic aortic (valve) stenosis: Secondary | ICD-10-CM

## 2022-05-30 DIAGNOSIS — E785 Hyperlipidemia, unspecified: Secondary | ICD-10-CM | POA: Diagnosis not present

## 2022-05-30 DIAGNOSIS — Z79899 Other long term (current) drug therapy: Secondary | ICD-10-CM | POA: Diagnosis not present
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
## 2022-05-30 NOTE — Patient Instructions (Addendum)
Get Diabetic Eye Exam scheduled.  we opted to stop metoprolol 12.5 mg XR - has had some mild dizziness and that could help as well -let me know if heart rate gets above 90 resting -consider omron series 3 at [employer] or [facility] or [employer]- shouldn't be more than $40   blood pressure ok but we are stopping metoprolol due to heart rate- asked patient and daughter to update [place] in 2 weeks with home blood pressures and hear rates  We will call you within two weeks about your referral to echocardiogram and dermatology. If you do not hear within 2 weeks, give [place] a call.   Please stop by lab before you go If you have mychart- we will send your results within 3 business days of [place] receiving them.  If you do not have mychart- we will call you about results within 5 business days of [place] receiving them.  *please also note that you will see labs on mychart as soon as they post. I will later go in and write notes on them- will say "notes from [name]"   You are eligible to schedule your annual wellness visit with our nurse specialist Otila Kluver.  Please consider scheduling this before you leave today  Recommended follow up: Return in about 6 months (around 11/28/2022) for followup or sooner if needed.Schedule b4 you leave.

## 2022-05-31 LAB — COMPREHENSIVE METABOLIC PANEL
ALT: 15 U/L (ref 0–53)
AST: 20 U/L (ref 0–37)
Albumin: 4 g/dL (ref 3.5–5.2)
Alkaline Phosphatase: 107 U/L (ref 39–117)
BUN: 27 mg/dL — ABNORMAL HIGH (ref 6–23)
CO2: 31 mEq/L (ref 19–32)
Calcium: 8.8 mg/dL (ref 8.4–10.5)
Chloride: 104 mEq/L (ref 96–112)
Creatinine, Ser: 1.78 mg/dL — ABNORMAL HIGH (ref 0.40–1.50)
GFR: 34.12 mL/min — ABNORMAL LOW (ref >60.00)
Glucose, Bld: 116 mg/dL — ABNORMAL HIGH (ref 70–99)
Potassium: 4.1 mEq/L (ref 3.5–5.1)
Sodium: 141 mEq/L (ref 135–145)
Total Bilirubin: 0.5 mg/dL (ref 0.2–1.2)
Total Protein: 7 g/dL (ref 6.0–8.3)

## 2022-05-31 LAB — LIPID PANEL
Cholesterol: 98 mg/dL (ref 0–200)
HDL: 34.5 mg/dL — ABNORMAL LOW (ref >39.00)
LDL Cholesterol: 43 mg/dL (ref 0–99)
NonHDL: 63.92
Total CHOL/HDL Ratio: 3
Triglycerides: 103 mg/dL (ref 0.0–149.0)
VLDL: 20.6 mg/dL (ref 0.0–40.0)

## 2022-05-31 LAB — CBC WITH DIFFERENTIAL/PLATELET
Basophils Absolute: 0.1 10*3/uL (ref 0.0–0.1)
Basophils Relative: 1.2 % (ref 0.0–3.0)
Eosinophils Absolute: 0.2 10*3/uL (ref 0.0–0.7)
Eosinophils Relative: 3.5 % (ref 0.0–5.0)
HCT: 42.5 % (ref 39.0–52.0)
Hemoglobin: 14.6 g/dL (ref 13.0–17.0)
Lymphocytes Relative: 30.3 % (ref 12.0–46.0)
Lymphs Abs: 2.1 10*3/uL (ref 0.7–4.0)
MCHC: 34.2 g/dL (ref 30.0–36.0)
MCV: 95.6 fl (ref 78.0–100.0)
Monocytes Absolute: 0.5 10*3/uL (ref 0.1–1.0)
Monocytes Relative: 7.6 % (ref 3.0–12.0)
Neutro Abs: 4 10*3/uL (ref 1.4–7.7)
Neutrophils Relative %: 57.4 % (ref 43.0–77.0)
Platelets: 178 10*3/uL (ref 150.0–400.0)
RBC: 4.45 Mil/uL (ref 4.22–5.81)
RDW: 14.1 % (ref 11.5–15.5)
WBC: 6.9 10*3/uL (ref 4.0–10.5)

## 2022-05-31 LAB — VITAMIN B12: Vitamin B-12: 226 pg/mL (ref 211–911)

## 2022-05-31 LAB — HEMOGLOBIN A1C: Hgb A1c MFr Bld: 6.5 % (ref 4.6–6.5)

## 2022-06-15 ENCOUNTER — Telehealth: Payer: Self-pay | Admitting: Family Medicine

## 2022-06-15 NOTE — Telephone Encounter (Signed)
Patient was kind enough to drop off home readings-he listed to numbers-pulse and heart rate-those should be the same number but he listed them as slightly different numbers for example on November 25 listed 96 for pulse and 56 for heart rate.  What I actually needed was his blood pressure-it should be something like 120/80 and his pulse/heart rate which hopefully is in the 60-100 range-can you please have him redo this

## 2022-06-17 NOTE — Telephone Encounter (Signed)
LVM for pt to repeat his BP and give [place] an update and also his pulse/ox

## 2022-06-20 ENCOUNTER — Encounter (HOSPITAL_COMMUNITY): Payer: Self-pay | Admitting: Family Medicine

## 2022-06-24 NOTE — Telephone Encounter (Signed)
Team could you follow up again?

## 2022-06-25 DIAGNOSIS — I429 Cardiomyopathy, unspecified: Secondary | ICD-10-CM | POA: Diagnosis not present

## 2022-06-25 DIAGNOSIS — D692 Other nonthrombocytopenic purpura: Secondary | ICD-10-CM | POA: Diagnosis not present

## 2022-06-25 DIAGNOSIS — F3342 Major depressive disorder, recurrent, in full remission: Secondary | ICD-10-CM | POA: Diagnosis not present

## 2022-06-25 DIAGNOSIS — N1832 Chronic kidney disease, stage 3b: Secondary | ICD-10-CM | POA: Diagnosis not present

## 2022-06-25 DIAGNOSIS — I700 Atherosclerosis of aorta: Secondary | ICD-10-CM | POA: Diagnosis not present

## 2022-06-25 DIAGNOSIS — Z7984 Long term (current) use of oral hypoglycemic drugs: Secondary | ICD-10-CM | POA: Diagnosis not present

## 2022-06-25 DIAGNOSIS — E1151 Type 2 diabetes mellitus with diabetic peripheral angiopathy without gangrene: Secondary | ICD-10-CM | POA: Diagnosis not present

## 2022-06-25 DIAGNOSIS — Z6823 Body mass index (BMI) 23.0-23.9, adult: Secondary | ICD-10-CM | POA: Diagnosis not present

## 2022-06-25 DIAGNOSIS — E1122 Type 2 diabetes mellitus with diabetic chronic kidney disease: Secondary | ICD-10-CM | POA: Diagnosis not present

## 2022-06-25 NOTE — Telephone Encounter (Signed)
Called and spoke with pt and he thought you only wanted his pulse and o2 readings. Pt states he does not have a bp cuff at home but he will try and get by a pharmacy each day for them to take his reading since buying a cough is expensive for him at this time. He will call in on Monday with readings if he is able to obtain a way to get them.

## 2022-06-25 NOTE — Telephone Encounter (Signed)
Blood pressure and heart rate please. Cuff should not be that expensive- I think can get omron 3 at [employer] or [employer] for around 35-40 and good to have long term if possible

## 2022-06-27 ENCOUNTER — Other Ambulatory Visit: Payer: Self-pay

## 2022-06-27 MED ORDER — METOPROLOL SUCCINATE ER 25 MG PO TB24: 12.5000 mg | ORAL_TABLET | Freq: Every day | ORAL | 1 refills | Status: DC

## 2022-07-04 ENCOUNTER — Telehealth (HOSPITAL_COMMUNITY): Payer: Self-pay | Admitting: Family Medicine

## 2022-07-04 NOTE — Telephone Encounter (Signed)
Just an FYI. We have made several attempts to contact this patient including sending a letter to schedule or reschedule their echocardiogram. We will be removing the patient from the echo [place].  06/21/22 MAILED LETTER [facility]  06/18/22 [facility] to schedule x 3 @ 10:40/[facility]  06/13/22 [facility] to schedule x 2 @ 10:15/[facility]  06/10/22 [facility] to schedule @ 9:47/[facility]    Thank you

## 2022-07-04 NOTE — Telephone Encounter (Signed)
Team can you check with him or daughter (I believe on DPR)

## 2022-07-05 NOTE — Telephone Encounter (Signed)
Called and lm on pt vm tcb. 

## 2022-07-13 ENCOUNTER — Other Ambulatory Visit: Payer: Self-pay | Admitting: Family Medicine

## 2022-07-18 ENCOUNTER — Telehealth: Payer: Self-pay

## 2022-07-18 NOTE — Telephone Encounter (Signed)
Pt dropped bp readings off, see below:  07/11/22 1:55pm- 155/90  07/12/22 9:45 am- 185/90  07/13/22 9:36 am-175/96

## 2022-07-22 NOTE — Telephone Encounter (Signed)
Called and lm for pt tcb. 

## 2022-07-26 ENCOUNTER — Other Ambulatory Visit: Payer: Self-pay

## 2022-07-26 ENCOUNTER — Emergency Department (HOSPITAL_COMMUNITY)
Admission: EM | Admit: 2022-07-26 | Discharge: 2022-07-26 | Disposition: A | Discharge status: Home / Self Care | Payer: PPO | Attending: Emergency Medicine | Admitting: Emergency Medicine

## 2022-07-26 ENCOUNTER — Encounter (HOSPITAL_COMMUNITY): Payer: Self-pay | Admitting: Internal Medicine

## 2022-07-26 DIAGNOSIS — I10 Essential (primary) hypertension: Secondary | ICD-10-CM | POA: Diagnosis not present

## 2022-07-26 DIAGNOSIS — Z7901 Long term (current) use of anticoagulants: Secondary | ICD-10-CM | POA: Insufficient documentation

## 2022-07-26 DIAGNOSIS — R001 Bradycardia, unspecified: Secondary | ICD-10-CM | POA: Diagnosis not present

## 2022-07-26 DIAGNOSIS — Z79899 Other long term (current) drug therapy: Secondary | ICD-10-CM | POA: Insufficient documentation

## 2022-07-26 DIAGNOSIS — Z8546 Personal history of malignant neoplasm of prostate: Secondary | ICD-10-CM | POA: Insufficient documentation

## 2022-07-26 DIAGNOSIS — R42 Dizziness and giddiness: Secondary | ICD-10-CM | POA: Diagnosis present

## 2022-07-26 DIAGNOSIS — Z85828 Personal history of other malignant neoplasm of skin: Secondary | ICD-10-CM | POA: Insufficient documentation

## 2022-07-26 DIAGNOSIS — Z7984 Long term (current) use of oral hypoglycemic drugs: Secondary | ICD-10-CM | POA: Diagnosis not present

## 2022-07-26 DIAGNOSIS — R944 Abnormal results of kidney function studies: Secondary | ICD-10-CM | POA: Diagnosis not present

## 2022-07-26 DIAGNOSIS — E1165 Type 2 diabetes mellitus with hyperglycemia: Secondary | ICD-10-CM | POA: Diagnosis not present

## 2022-07-26 LAB — BASIC METABOLIC PANEL
Anion gap: 12 (ref 5–15)
BUN: 23 mg/dL (ref 8–23)
CO2: 25 mmol/L (ref 22–32)
Calcium: 8.7 mg/dL — ABNORMAL LOW (ref 8.9–10.3)
Chloride: 103 mmol/L (ref 98–111)
Creatinine, Ser: 1.31 mg/dL — ABNORMAL HIGH (ref 0.61–1.24)
GFR, Estimated: 53 mL/min — ABNORMAL LOW (ref >60)
Glucose, Bld: 164 mg/dL — ABNORMAL HIGH (ref 70–99)
Potassium: 3.8 mmol/L (ref 3.5–5.1)
Sodium: 140 mmol/L (ref 135–145)

## 2022-07-26 LAB — CBC
HCT: 39.5 % (ref 39.0–52.0)
Hemoglobin: 13.5 g/dL (ref 13.0–17.0)
MCH: 33.1 pg (ref 26.0–34.0)
MCHC: 34.2 g/dL (ref 30.0–36.0)
MCV: 96.8 fL (ref 80.0–100.0)
Platelets: 165 10*3/uL (ref 150–400)
RBC: 4.08 MIL/uL — ABNORMAL LOW (ref 4.22–5.81)
RDW: 13.4 % (ref 11.5–15.5)
WBC: 5.9 10*3/uL (ref 4.0–10.5)
nRBC: 0 % (ref 0.0–0.2)

## 2022-07-26 LAB — TROPONIN I (HIGH SENSITIVITY): Troponin I (High Sensitivity): 13 ng/L (ref <18)

## 2022-07-26 LAB — MAGNESIUM: Magnesium: 1.9 mg/dL (ref 1.7–2.4)

## 2022-07-26 NOTE — ED Provider Triage Note (Signed)
Emergency Medicine Provider Triage Evaluation Note  ADONIS YIM , a 89 y.o. male  was evaluated in triage.  Pt complains of seen for bradycardia, regular heart rate and rhythm, he was sent for an echo, and cardiac evaluation by his PCP, they had not schedule an appointment and showed up to the CT imaging center, and were told to come to the emergency department based on his symptoms.  He reports he has no acute distress, chest pain at this time, however in triage she is bradycardic with a rate of 34, does feel some irregular rhythm in his chest.  Review of Systems  Positive: Chest tightness, shortness of breath Negative: Fever, chills  Physical Exam  BP (!) 152/77 (BP Location: Right Arm)   Pulse (!) 34   Temp 97.7 F (36.5 C) (Oral)   Ht '5\' 8"'$  (1.727 m)   Wt 72.6 kg   SpO2 100%   BMI 24.33 kg/m  Gen:   Awake, no distress   Resp:  Normal effort  MSK:   Moves extremities without difficulty  Other:  Bradycardic, irregular rhythm  Medical Decision Making  Medically screening exam initiated at 2:53 PM.  Appropriate orders placed.  ELAND LAMANTIA was informed that the remainder of the evaluation will be completed by another provider, this initial triage assessment does not replace that evaluation, and the importance of remaining in the ED until their evaluation is complete.  Workup initiated   Anselmo Pickler, [place] 07/26/22 1453
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
## 2022-07-26 NOTE — ED Triage Notes (Signed)
Pt came in via POV d/t his PCP recommending he come here for an ECHO via appointment. He never scheduled it & came in to see if his father could get one today d/t HTN noticed. A/Ox4, denies CP.

## 2022-07-26 NOTE — Discharge Instructions (Addendum)
Your workup today in the emergency department was overall reassuring.  You came in to have an echocardiogram done that was scheduled by your primary care provider to be done as an outpatient study.  Please reach out to the scheduler for the echo to get this scheduled to be done on an outpatient basis.  Your heart rate was noted to be 34 when he first arrived into the emergency department.  This improved to 50-60 during your visit.  However you do not have any symptoms of lightheadedness, palpitations, chest pain, shortness of breath or any other concerning cause of her heart rate.  She ambulated in the emergency department without an issue.  Your heart rate increases with activity appropriately.  Follow-up with your primary care doctor.

## 2022-07-27 LAB — TSH: TSH: 3.554 u[IU]/mL (ref 0.350–4.500)

## 2022-08-08 ENCOUNTER — Ambulatory Visit (INDEPENDENT_AMBULATORY_CARE_PROVIDER_SITE_OTHER): Payer: PPO | Admitting: Physician Assistant

## 2022-08-08 ENCOUNTER — Encounter: Payer: Self-pay | Admitting: Family Medicine

## 2022-08-08 ENCOUNTER — Ambulatory Visit (INDEPENDENT_AMBULATORY_CARE_PROVIDER_SITE_OTHER): Payer: PPO | Admitting: Family Medicine

## 2022-08-08 ENCOUNTER — Encounter: Payer: Self-pay | Admitting: Physician Assistant

## 2022-08-08 ENCOUNTER — Ambulatory Visit: Payer: PPO | Admitting: Physician Assistant

## 2022-08-08 VITALS — BP 129/70 | HR 48 | Resp 20 | Ht 70.0 in | Wt 166.0 lb

## 2022-08-08 VITALS — BP 112/80 | HR 52 | Temp 98.4°F | Ht 70.0 in | Wt 161.2 lb

## 2022-08-08 DIAGNOSIS — I10 Essential (primary) hypertension: Secondary | ICD-10-CM

## 2022-08-08 DIAGNOSIS — N183 Chronic kidney disease, stage 3 unspecified: Secondary | ICD-10-CM

## 2022-08-08 DIAGNOSIS — I700 Atherosclerosis of aorta: Secondary | ICD-10-CM | POA: Diagnosis not present

## 2022-08-08 DIAGNOSIS — I480 Paroxysmal atrial fibrillation: Secondary | ICD-10-CM

## 2022-08-08 DIAGNOSIS — R413 Other amnesia: Secondary | ICD-10-CM | POA: Diagnosis not present

## 2022-08-08 DIAGNOSIS — E1169 Type 2 diabetes mellitus with other specified complication: Secondary | ICD-10-CM | POA: Diagnosis not present

## 2022-08-08 DIAGNOSIS — R001 Bradycardia, unspecified: Secondary | ICD-10-CM | POA: Diagnosis not present

## 2022-08-08 DIAGNOSIS — F01A0 Vascular dementia, mild, without behavioral disturbance, psychotic disturbance, mood disturbance, and anxiety: Secondary | ICD-10-CM | POA: Diagnosis not present

## 2022-08-08 DIAGNOSIS — E785 Hyperlipidemia, unspecified: Secondary | ICD-10-CM

## 2022-08-08 NOTE — Patient Instructions (Addendum)
Schedule diabetic eye exam.  We will call you within two weeks about your referral to Dr. Curt Bears. If you do not hear within 2 weeks, give [place] a call or call them directly -you should also be able to ask cardiology to schedule the echo I believe  We considered holding memantine which can cause heart rate to be slower but you preferred to chat with Dr. Curt Bears first for me to [facility]  Recommended follow up: Return for next already scheduled visit or sooner if needed.

## 2022-08-08 NOTE — Progress Notes (Signed)
Phone [phone] In person visit   Subjective:   Calvin Burnett is a 89 y.o. year old very pleasant male patient who presents for/with See problem oriented charting Chief Complaint  Patient presents with   Follow-up    Pt is here for ER f/u on bradycardia, at nephrology today his HR was 48 per daughter.   Past Medical History-  Patient Active Problem List   Diagnosis Date Noted   Intra-abdominal abscess ([place]) 04/24/2019    Priority: High   B12 deficiency 12/04/2017    Priority: High   pT1pN0 colon cancer s/p robotic right colectomy 11/26/2017 10/02/2017    Priority: High   AF (paroxysmal atrial fibrillation) ([place]) 04/09/2017    Priority: High   Night sweats 09/16/2016    Priority: High   Major depressive disorder with single episode, in full remission ([place]) 07/29/2016    Priority: High   Rectal pain 10/14/2014    Priority: High   Type 2 diabetes mellitus ([place]) 07/31/2010    Priority: High   Vascular dementia ([place]) 07/27/2008    Priority: High   Mild aortic stenosis 04/05/2021    Priority: Medium    BPPV (benign paroxysmal positional vertigo) 12/17/2019    Priority: Medium    History of adenomatous polyp of colon 10/27/2014    Priority: Medium    Syncope 09/27/2014    Priority: Medium    CKD (chronic kidney disease), stage III ([place]) 06/01/2014    Priority: Medium    Hyperlipidemia associated with type 2 diabetes mellitus ([place]) 03/01/2014    Priority: Medium    Basal cell carcinoma 05/03/2009    Priority: Medium    Essential hypertension 01/30/2007    Priority: Medium    Gout 10/28/2019    Priority: Low   Aortic atherosclerosis ([place]) 01/29/2019    Priority: Low   Stricture of sigmoid s/p robotic sigmoidectomy 11/26/2017 11/26/2017    Priority: Low   Chronic anticoagulation 11/26/2017    Priority: Low   Right inguinal hernia 10/02/2017    Priority: Low   Bowel habit changes 10/14/2014    Priority: Low   Bradycardia 09/27/2014    Priority: Low   LEG  CRAMPS, NOCTURNAL 04/30/2010    Priority: Low   Cervical spondylosis without myelopathy 12/11/2007    Priority: Low   GERD (gastroesophageal reflux disease) 11/05/2007    Priority: Low    Medications- reviewed and updated Current Outpatient Medications  Medication Sig Dispense Refill   blood glucose meter kit and supplies KIT Dispense based on patient and insurance preference. Use up to four times daily as directed. Dx E11.9 1 each 0   cyanocobalamin 1000 MCG tablet Take 1,000 mcg by mouth daily.     ELIQUIS 2.5 MG TABS tablet TAKE 1 TABLET BY MOUTH TWICE A DAY 60 tablet 11   latanoprost (XALATAN) 0.005 % ophthalmic solution Place 1 drop into both eyes at bedtime.   12   memantine (NAMENDA XR) 28 MG CP24 24 hr capsule Take 1 capsule (28 mg total) by mouth daily. 30 capsule 11   metFORMIN (GLUCOPHAGE-XR) 500 MG 24 hr tablet TAKE 1 TABLET BY MOUTH EVERY DAY WITH BREAKFAST 30 tablet 5   rosuvastatin (CRESTOR) 20 MG tablet TAKE 1 TABLET (20 MG TOTAL) BY MOUTH ONCE A WEEK. 13 tablet 3   No current facility-administered medications for this visit.     Objective:  BP 112/80   Pulse (!) 52   Temp 98.4 F (36.9 C)   Ht '5\' 10"'$  (1.778  m)   Wt 161 lb 3.2 oz (73.1 kg)   SpO2 99%   BMI 23.13 kg/m  Gen: NAD, resting comfortably CV: Bradycardic and stable murmur noted  lungs: CTAB no crackles, wheeze, rhonchi Ext: no edema Skin: warm, dry     Assessment and Plan   # Bradycardia S: Patient with history of issues with bradycardia on metoprolol and this was stopped previously when patient was experiencing lightheadedness/dizziness-this resolved off medication.  He was asymptomatic when he arrived but was hoping to have echocardiogram done which has been ordered outpatient.  Did have heart rate in the hospital as low as 34 but during most of time with physician was noted 50-60 irregularly irregular.  TSH was completed to make sure not related to bradycardia.  Labs largely reassuring other than  mildly elevated creatinine, slightly low calcium.  Troponin was not elevated.  Of note the echocardiogram has been ordered for mild aortic stenosis follow-up but not related to bradycardia Lab Results  Component Value Date   TSH 3.554 07/26/2022   Today patient reports was seen by neurology earlier today and noted to have bradycardia to 48. No chest pain, shortness of breath, abnormal fatigue. Perhaps mild dizziness but much better than previous A/P: Bradycardia noted as low as 34 in the emergency department and 44 at neurology earlier today.  He is having some mild intermittent dizziness which could be related to this but also has aortic stenosis-encouraged him to call cardiology to schedule the echocardiogram which has already been ordered.  Since he already has atrial fibrillation and he has a family member who has seen Dr. Curt Bears he and his daughter specifically request Dr. Curt Bears and referral was placed today -I did discuss with patient that memantine can be associated with bradycardia and I gave the option of stopping the medication but they wanted to hold off for now until seeing cardiology - If he has new or worsening symptoms should seek care immediately   #Vascular dementia- Dr. Delice Lesch S: Patient is compliant with memantine/Namenda 28 mg daily.  Patient has been on memantine for many years.  A/P: Stable at visit earlier today-continue current medication for now though once again discussed relation to bradycardia  #Atrial fibrillation S: Patient is anticoagulated with Eliquis 2.5 mg twice daily  -no longer on rate control due to bradycardia (prior metoprolol 12.5 mg extended release) A/P: Appropriately anticoagulated-age over 82 and creatinine over 1.5 several times in the last year (though most recently at 1.31)  # Diabetes S: Compliant with Metformin 500 mg XR daily with breakfast Exercise and diet-he is still walking regularly and trying to eat reasonably healthy Lab Results   Component Value Date   HGBA1C 6.5 05/30/2022   HGBA1C 6.4 12/06/2021   HGBA1C 6.5 07/17/2021   A/P: Too soon for A1c repeat-has been well-controlled-continue current medication - In regards to exercise I want him to continue but to monitor carefully   #hyperlipidemia/history of stroke-may have been atrial fibrillation related S: compliant with rosuvastatin 20 mg  Lab Results  Component Value Date   CHOL 98 05/30/2022   HDL 34.50 (L) 05/30/2022   LDLCALC 43 05/30/2022   LDLDIRECT 57.0 11/23/2020   TRIG 103.0 05/30/2022   CHOLHDL 3 05/30/2022   A/P: Excellent control last check with LDL under 70-continue current medication -Also has  aortic atherosclerosis (presumed stable)- LDL goal ideally <70-continue current medication  #hypertension/CKD stage III S: Medication: None   -Metoprolol 12.5 mg extended release for A-fib but stopped due to  bradycardia -Also stopped amlodipine 2.'5mg'$  - stopped hctz 12.'5mg'$  due to loewr BP and dizziness.   CKD stage III with creatinine ranging from 1.3-1.6 for the most part.  Night sweats in the past on ramipril and urine microalbumin to creatinine ratio has not been elevated so we have not restarted ACE inhibitor A/P: Blood pressure well-controlled without medication. - Has certainly had variability in renal function but most recently creatinine down to 1.3  Recommended follow up: Return for next already scheduled visit or sooner if needed. Future Appointments  Date Time Provider [place]  11/28/2022  9:00 AM Marin Olp, MD [facility]  02/06/2023 11:30 AM Shawn Route, Coralee Pesa, PA-C [facility] None    Lab/Order associations:   ICD-10-CM   1. AF (paroxysmal atrial fibrillation) ([place])  I48.0 Ambulatory referral to Cardiology    2. Bradycardia  R00.1 Ambulatory referral to Cardiology    3. Mild vascular dementia without behavioral disturbance, psychotic disturbance, mood disturbance, or anxiety ([name]) Chronic F01.A0     4. Hyperlipidemia  associated with type 2 diabetes mellitus ([name]) Chronic E11.69    E78.5     5. Aortic atherosclerosis ([name]) Chronic I70.0     6. Stage 3 chronic kidney disease, unspecified whether stage 3a or 3b CKD ([place]) Chronic N18.30       No orders of the defined types were placed in this encounter.   Return precautions advised.  [name], MD

## 2022-08-08 NOTE — Progress Notes (Signed)
Assessment/Plan:   Memory Impairment, likely of vascular etiology   Calvin Burnett is a very pleasant 89 y.o. RH male  with a history of Afib on Eliquis, DM2, hyperlipidemia, HTN, CKD3, B12 deficiency, vascular dementia recent presentation to the emergency department with bradycardia (07/26/2022) seen today in follow up for memory loss. Patient is currently on memantine XR 28 mg daily TD, tolerating well.  He continues to be stable from the cognitive standpoint, and he is able to perform well in most activities of daily living including driving.  He continues to live alone without any problems.      Follow up in 6 months. Continue memantine XR 28 mg daily TD (history of bradycardia with a CHI), side effects discussed Continue to control cardiovascular risk factors Continue to monitor bradycardia with cardiology Continue mood control as per PCP    Subjective:    This patient is accompanied in the office by his daughter  who supplements the history.  Previous records as well as any outside records available were reviewed prior to todays visit. Patient was last seen on 02/14/2022 at which time his  MMSE was 20/30   Any changes in memory since last visit?  "STM a little different but not worse "-daughter says.  He enjoys watching game shows such as [place].  He has a routine and "sticks to it ".  He is able to do all his ADLs without any problems.  He does not do any crossword puzzles or word finding.  Long-term memory is unremarkable. repeats oneself?  Denies  Disoriented when walking into a room?  Patient denies "house is the safe haven, he has around a 7 mile radius and goes back home".  Leaving objects in unusual places?  denies   Wandering behavior?  denies   Any personality changes since last visit?  denies   Any worsening depression?:  denies   Hallucinations or paranoia?  denies   Seizures?    denies    Any sleep changes?  He sleeps well . Denies vivid dreams, REM  behavior or sleepwalking   Sleep apnea?   denies   Any hygiene concerns?    denies   Independent of bathing and dressing?  Endorsed  Does the patient needs help with medications? He is in charge  Who is in charge of the finances?   He is in charge    Any changes in appetite? Eats well.  As mentioned above, he has a routine.  He goes to [facility] where the staff already knows what he is going to have and he has the same meal every day for breakfast.  He then may go to [place], to the pharmacy, and he still does go to his appointments by himself.  Patient have trouble swallowing?  denies   Does the patient cook?  Any kitchen accidents such as leaving the stove on? Patient denies   Any headaches?   denies   Chronic back pain  denies   Ambulates with difficulty?     denies   Recent falls or head injuries? denies     Unilateral weakness, numbness or tingling?  denies   Any tremors?  denies   Any anosmia?  Patient denies   Any incontinence of urine?  denies   Any bowel dysfunction?  denies      Patient lives alone.  He has a great family support.  His daughter lives in [place], but she called several times a day.  The  younger son takes care of him here locally. Does the patient drive?  He continues to drive short distances within a 7 mile radius, without getting lost.  History on Initial Assessment 05/25/2020: This is a pleasant 89 year old right-handed man with a history of hypertension, hyperlipidemia, diabetes, atrial fibrillation on Eliquis, stroke, presenting for evaluation of vascular dementia. He is accompanied by his daughter Calvin Burnett who helps supplement the history today. He feels his memory is pretty good. Calvin Burnett started noticing short-term memory changes around 2-3 years ago, but feels that he is overall doing well independently. He lives alone. He has a good family support system with several family members locally checking on his regularly. His wife passed away 5 years ago and he was  under a lot of stress. There have been several deaths in the family and he has been through a lot. He denies getting lost driving, Calvin Burnett denies any driving concerns. He manages his own medications and finances, he and Calvin Burnett deny any issues with these. 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  He denies misplacing things frequently, Calvin Burnett reports he rarely misplaces things but gets really hard on himself when he loses things. He denies any word-finding difficulties. He does not cook much and denies leaving the stove on. He is independent with dressing and bathing, Calvin Burnett denies any hygiene concerns. His brother is his POA. No family history of dementia. He denies any significant head injuries or alcohol use. Notes reviewed, he was started on Namenda by Dr. Arnoldo Morale in 2015. MMSE 26/30 in 2017, 21/30 in 2019.    He denies any headaches, dizziness, diplopia, dysarthria, dysphagia, neck pain, focal numbness/tingling/weakness, bowel/bladder dysfunction. No anosmia, tremors, no falls.  He walks a mile a day. He has some back pain today, right leg feels different. Sleep is good. No personality changes, paranoia or hallucinations. He denies any known prior history of stroke. Calvin Burnett reports that he passed out while at [place], and this is when he had a stroke. EPIC notes reviewed, he had a syncopal episode in 2016 due to bradycardia. CT head at that time did not show any evidence of stroke. There was diffuse atrophy and chronic microvascular disease. He had an MRI brain with and without contrast in 11/2017 for memory loss, confusion. There was note of a remote right temporal lobe infarct with encephalomalacia that was not seen in 2016 imaging.  I personally reviewed MRI brain without contrast done 12/2019 which did not show any acute changes. There was a large remote right temporal infarct, inferior division MCA territory, diffuse atrophy and mild chronic microvascular disease. There  were remote hemorrhages along the right temporal occipital convexity without generalized chronic lobar hemorrhage, possible mild gliosis in the parasagittal or parietal lobes adjacent to the inferior falx.  PREVIOUS MEDICATIONS:   CURRENT MEDICATIONS:  Outpatient Encounter Medications as of 08/08/2022  Medication Sig   blood glucose meter kit and supplies KIT Dispense based on patient and insurance preference. Use up to four times daily as directed. Dx E11.9   cyanocobalamin 1000 MCG tablet Take 1,000 mcg by mouth daily.   ELIQUIS 2.5 MG TABS tablet TAKE 1 TABLET BY MOUTH TWICE A DAY   latanoprost (XALATAN) 0.005 % ophthalmic solution Place 1 drop into both eyes at bedtime.    memantine (NAMENDA XR) 28 MG CP24 24 hr capsule Take 1 capsule (28 mg total) by mouth daily.   metFORMIN (GLUCOPHAGE-XR) 500 MG 24 hr tablet TAKE 1 TABLET  BY MOUTH EVERY DAY WITH BREAKFAST   metoprolol succinate (TOPROL-XL) 25 MG 24 hr tablet Take 0.5 tablets (12.5 mg total) by mouth daily.   rosuvastatin (CRESTOR) 20 MG tablet TAKE 1 TABLET (20 MG TOTAL) BY MOUTH ONCE A WEEK.   No facility-administered encounter medications on file as of 08/08/2022.       02/14/2022   12:00 PM 02/08/2021    8:00 AM 03/05/2017   11:44 AM  MMSE - Mini Mental State Exam  Not completed:   Refused  Orientation to time 1 4   Orientation to Place 5 4   Registration 3 3   Attention/ Calculation 3 2   Recall 0 0   Language- name 2 objects 2 2   Language- repeat 1 1   Language- follow 3 step command 3 3   Language- read & follow direction 1 1   Write a sentence 1 1   Copy design 0 0   Total score 20 21       05/25/2020   10:00 AM  Montreal Cognitive Assessment   Visuospatial/ Executive (0/5) 3  Naming (0/3) 2  Attention: Read list of digits (0/2) 2  Attention: Read list of letters (0/1) 1  Attention: Serial 7 subtraction starting at 100 (0/3) 2  Language: Repeat phrase (0/2) 2  Language : Fluency (0/1) 1  Abstraction (0/2) 2   Delayed Recall (0/5) 0  Orientation (0/6) 4  Total 19  Adjusted Score (based on education) 20    Objective:     PHYSICAL EXAMINATION:    VITALS:   Vitals:   08/08/22 1016  BP: 129/70  Pulse: (!) 48  Resp: 20  SpO2: 97%  Weight: 166 lb (75.3 kg)  Height: '5\' 10"'$  (1.778 m)    GEN:  The patient appears stated age and is in NAD. HEENT:  Normocephalic, atraumatic.   Neurological examination:  General: NAD, well-groomed, appears stated age. Orientation: The patient is alert. Oriented to person, place and date Cranial nerves: There is good facial symmetry.The speech is fluent and clear. No aphasia or dysarthria. Fund of knowledge is appropriate. Recent and remote memory are impaired. Attention and concentration are reduced.  Able to name objects and repeat phrases.  Hearing is intact to conversational tone.    Sensation: Sensation is intact to light touch throughout Motor: Strength is at least antigravity x4. DTR's 2/4 in UE/LE     Movement examination: Tone: There is normal tone in the UE/LE Abnormal movements:  no tremor.  No myoclonus.  No asterixis.   Coordination:  There is no decremation with RAM's. Normal finger to nose  Gait and Station: The patient has no difficulty arising out of a deep-seated chair without the use of the hands. The patient's stride length is good.  Gait is cautious and narrow.    Thank you for allowing [place] the opportunity to participate in the care of this nice patient. Please do not hesitate to contact us for any questions or concerns.   Total time spent on today's visit was 20 minutes dedicated to this patient today, preparing to see patient, examining the patient, ordering tests and/or medications and counseling the patient, documenting clinical information in the EHR or other health record, independently interpreting results and communicating results to the patient/family, discussing treatment and goals, answering patient's questions and  coordinating care.  Cc:  Marin Olp, MD  Sharene Butters 08/08/2022 12:31 PM

## 2022-08-08 NOTE — Patient Instructions (Addendum)
It was a pleasure to see you today at our office.   Recommendations:  Follow up in 6  months Continue Namenda 28 mg XR Continue B12 supplements Monitor your pulse  Whom to call:  Memory  decline, memory medications: Call our office [phone]   For psychiatric meds, mood meds: Please have your primary care physician manage these medications.   Counseling regarding caregiver distress, including caregiver depression, anxiety and issues regarding community resources, adult day care programs, adult living facilities, or memory care questions:   Feel free to contact [place], Social Worker at [phone]   For assessment of decision of mental capacity and competency:  Call Dr. Anthoney Harada, geriatric psychiatrist at [phone]  For guidance in geriatric dementia issues please call [facility] [phone]  For guidance regarding [facility] and if placement were needed at the facility, contact Arnell Asal, Social Worker tel: [phone]  If you have any severe symptoms of a stroke, or other severe issues such as confusion,severe chills or fever, etc call 911 or go to the ER as you may need to be evaluated further   Feel free to visit Facebook page " [employer]" for tips of how to care for people with memory problems.    Feel free to go to the following database for funded clinical studies conducted around the world: http://saunders.com/   https://www.triadclinicaltrials.com/     RECOMMENDATIONS FOR ALL PATIENTS WITH MEMORY PROBLEMS: 1. Continue to exercise (Recommend 30 minutes of walking everyday, or 3 hours every week) 2. Increase social interactions - continue going to [place] and enjoy social gatherings with friends and family 3. Eat healthy, avoid fried foods and eat more fruits and vegetables 4. Maintain adequate blood pressure, blood sugar, and blood cholesterol level. Reducing the risk of stroke and cardiovascular  disease also helps promoting better memory. 5. Avoid stressful situations. Live a simple life and avoid aggravations. Organize your time and prepare for the next day in anticipation. 6. Sleep well, avoid any interruptions of sleep and avoid any distractions in the bedroom that may interfere with adequate sleep quality 7. Avoid sugar, avoid sweets as there is a strong link between excessive sugar intake, diabetes, and cognitive impairment We discussed the Mediterranean diet, which has been shown to help patients reduce the risk of progressive memory disorders and reduces cardiovascular risk. This includes eating fish, eat fruits and green leafy vegetables, nuts like almonds and hazelnuts, walnuts, and also use olive oil. Avoid fast foods and fried foods as much as possible. Avoid sweets and sugar as sugar use has been linked to worsening of memory function.  There is always a concern of gradual progression of memory problems. If this is the case, then we may need to adjust level of care according to patient needs. Support, both to the patient and caregiver, should then be put into place.    FALL PRECAUTIONS: Be cautious when walking. Scan the area for obstacles that may increase the risk of trips and falls. When getting up in the mornings, sit up at the edge of the bed for a few minutes before getting out of bed. Consider elevating the bed at the head end to avoid drop of blood pressure when getting up. Walk always in a well-lit room (use night lights in the walls). Avoid area rugs or power cords from appliances in the middle of the walkways. Use a walker or a cane if necessary and consider physical therapy for balance exercise. Get your eyesight  checked regularly.  FINANCIAL OVERSIGHT: Supervision, especially oversight when making financial decisions or transactions is also recommended.  HOME SAFETY: Consider the safety of the kitchen when operating appliances like stoves, microwave oven, and blender.  Consider having supervision and share cooking responsibilities until no longer able to participate in those. Accidents with firearms and other hazards in the house should be identified and addressed as well.   ABILITY TO BE LEFT ALONE: If patient is unable to contact 911 operator, consider using LifeLine, or when the need is there, arrange for someone to stay with patients. Smoking is a fire hazard, consider supervision or cessation. Risk of wandering should be assessed by caregiver and if detected at any point, supervision and safe proof recommendations should be instituted.  MEDICATION SUPERVISION: Inability to self-administer medication needs to be constantly addressed. Implement a mechanism to ensure safe administration of the medications.   DRIVING: Regarding driving, in patients with progressive memory problems, driving will be impaired. We advise to have someone else do the driving if trouble finding directions or if minor accidents are reported. Independent driving assessment is available to determine safety of driving.   If you are interested in the driving assessment, you can contact the following:  [employer] in [place]  [place] [phone]  [place]  [facility] [phone] or [phone]

## 2022-08-08 NOTE — Assessment & Plan Note (Signed)
#  hypertension/CKD stage III S: Medication: None   -Metoprolol 12.5 mg extended release for A-fib but stopped due to bradycardia -Also stopped amlodipine 2.'5mg'$  - stopped hctz 12.'5mg'$  due to loewr BP and dizziness.   CKD stage III with creatinine ranging from 1.3-1.6 for the most part.  Night sweats in the past on ramipril and urine microalbumin to creatinine ratio has not been elevated so we have not restarted ACE inhibitor A/P: Blood pressure well-controlled without medication. - Has certainly had variability in renal function but most recently creatinine down to 1.3

## 2022-08-13 DIAGNOSIS — H3581 Retinal edema: Secondary | ICD-10-CM | POA: Diagnosis not present

## 2022-08-13 DIAGNOSIS — Z961 Presence of intraocular lens: Secondary | ICD-10-CM | POA: Diagnosis not present

## 2022-08-13 DIAGNOSIS — H353112 Nonexudative age-related macular degeneration, right eye, intermediate dry stage: Secondary | ICD-10-CM | POA: Diagnosis not present

## 2022-08-13 DIAGNOSIS — E119 Type 2 diabetes mellitus without complications: Secondary | ICD-10-CM | POA: Diagnosis not present

## 2022-08-13 DIAGNOSIS — H353221 Exudative age-related macular degeneration, left eye, with active choroidal neovascularization: Secondary | ICD-10-CM | POA: Diagnosis not present

## 2022-08-13 DIAGNOSIS — H26492 Other secondary cataract, left eye: Secondary | ICD-10-CM | POA: Diagnosis not present

## 2022-08-18 ENCOUNTER — Other Ambulatory Visit: Payer: Self-pay | Admitting: Physician Assistant

## 2022-08-20 ENCOUNTER — Ambulatory Visit: Payer: PPO | Admitting: Physician Assistant

## 2022-08-22 ENCOUNTER — Ambulatory Visit: Payer: PPO | Admitting: Physician Assistant

## 2022-09-10 ENCOUNTER — Ambulatory Visit: Payer: PPO | Attending: Cardiology | Admitting: Cardiology

## 2022-09-10 ENCOUNTER — Encounter: Payer: Self-pay | Admitting: *Deleted

## 2022-09-10 ENCOUNTER — Encounter: Payer: Self-pay | Admitting: Cardiology

## 2022-09-10 ENCOUNTER — Ambulatory Visit (INDEPENDENT_AMBULATORY_CARE_PROVIDER_SITE_OTHER): Payer: PPO

## 2022-09-10 VITALS — BP 94/60 | HR 50 | Ht 70.0 in | Wt 165.0 lb

## 2022-09-10 DIAGNOSIS — I10 Essential (primary) hypertension: Secondary | ICD-10-CM

## 2022-09-10 DIAGNOSIS — D6869 Other thrombophilia: Secondary | ICD-10-CM | POA: Diagnosis not present

## 2022-09-10 DIAGNOSIS — R079 Chest pain, unspecified: Secondary | ICD-10-CM | POA: Diagnosis not present

## 2022-09-10 DIAGNOSIS — R001 Bradycardia, unspecified: Secondary | ICD-10-CM | POA: Diagnosis not present

## 2022-09-10 DIAGNOSIS — I4821 Permanent atrial fibrillation: Secondary | ICD-10-CM

## 2022-09-10 MED ORDER — APIXABAN 5 MG PO TABS: 5.0000 mg | ORAL_TABLET | Freq: Two times a day (BID) | ORAL | 6 refills | Status: DC

## 2022-09-10 NOTE — Patient Instructions (Addendum)
Medication Instructions:  Your physician has recommended you make the following change in your medication:  INCREASE Eliquis to 5 mg twice daily  *If you need a refill on your cardiac medications before your next appointment, please call your pharmacy*   Lab Work: None ordered If you have labs (blood work) drawn today and your tests are completely normal, you will receive your results only by: [place] (if you have MyChart) OR A paper copy in the mail If you have any lab test that is abnormal or we need to change your treatment, we will call you to review the results.   Testing/Procedures: Your physician recommends a cardiac PET scan.   Please see instruction sheet given to you today.                             ZIO XT- Long Term Monitor Instructions  Your physician has requested you wear a ZIO patch monitor for 14 days.  This is a single patch monitor. [employer] supplies one patch monitor per enrollment. Additional stickers are not available. Please do not apply patch if you will be having a Nuclear Stress Test,  Echocardiogram, Cardiac CT, MRI, or Chest Xray during the period you would be wearing the  monitor. The patch cannot be worn during these tests. You cannot remove and re-apply the  ZIO XT patch monitor.  Your ZIO patch monitor will be mailed 3 day USPS to your address on file. It may take 3-5 days  to receive your monitor after you have been enrolled.  Once you have received your monitor, please review the enclosed instructions. Your monitor  has already been registered assigning a specific monitor serial # to you.  Billing and Patient Assistance Program Information  We have supplied [employer] with any of your insurance information on file for billing purposes. [employer] offers a sliding scale Patient Assistance Program for patients that do not have  insurance, or whose insurance does not completely cover the cost of the ZIO monitor.  You must apply for the Patient  Assistance Program to qualify for this discounted rate.  To apply, please call [employer] at [phone], select option 4, select option 2, ask to apply for  Patient Assistance Program. Theodore Demark will ask your household income, and how many people  are in your household. They will quote your out-of-pocket cost based on that information.  [employer] will also be able to set up a 39-month interest-free payment plan if needed.  Applying the monitor   Shave hair from upper left chest.  Hold abrader disc by orange tab. Rub abrader in 40 strokes over the upper left chest as  indicated in your monitor instructions.  Clean area with 4 enclosed alcohol pads. Let dry.  Apply patch as indicated in monitor instructions. Patch will be placed under collarbone on left  side of chest with arrow pointing upward.  Rub patch adhesive wings for 2 minutes. Remove white label marked "1". Remove the white  label marked "2". Rub patch adhesive wings for 2 additional minutes.  While looking in a mirror, press and release button in center of patch. A small green light will  flash 3-4 times. This will be your only indicator that the monitor has been turned on.  Do not shower for the first 24 hours. You may shower after the first 24 hours.  Press the button if you feel a symptom. You will hear a small click. Record Date,  Time and  Symptom in the Patient Logbook.  When you are ready to remove the patch, follow instructions on the last 2 pages of Patient  Logbook. Stick patch monitor onto the last page of Patient Logbook.  Place Patient Logbook in the blue and white box. Use locking tab on box and tape box closed  securely. The blue and white box has prepaid postage on it. Please place it in the mailbox as  soon as possible. Your physician should have your test results approximately 7 days after the  monitor has been mailed back to [facility].  Call [place] at [phone] if you have questions  regarding  your ZIO XT patch monitor. Call them immediately if you see an orange light blinking on your  monitor.  If your monitor falls off in less than 4 days, contact our Monitor department at [phone].  If your monitor becomes loose or falls off after 4 days call [employer] at [phone] for  suggestions on securing your monitor     Follow-Up: At [facility], you and your health needs are our priority.  As part of our continuing mission to provide you with exceptional heart care, we have created designated Provider Care Teams.  These Care Teams include your primary Cardiologist (physician) and Advanced Practice Providers (APPs -  Physician Assistants and Nurse Practitioners) who all work together to provide you with the care you need, when you need it.   Your next appointment:   To be  determined  The format for your next appointment:   In Person  Provider:   Allegra Lai, MD    Thank you for choosing [facility]!!   Trinidad Curet, RN [phone]  Other Instructions
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
## 2022-09-10 NOTE — Progress Notes (Unsigned)
Enrolled patient for a 14 day Zio XT  monitor to be mailed to patients home  °

## 2022-09-15 DIAGNOSIS — I4821 Permanent atrial fibrillation: Secondary | ICD-10-CM | POA: Diagnosis not present

## 2022-09-15 DIAGNOSIS — R001 Bradycardia, unspecified: Secondary | ICD-10-CM

## 2022-09-18 ENCOUNTER — Telehealth: Payer: Self-pay | Admitting: Family Medicine

## 2022-09-18 NOTE — Telephone Encounter (Signed)
Caller states: - Patient has been having dark stools for 3 days  - Weakness but probably due to heart issues   Patient and caller have been transferred to triage.

## 2022-09-19 ENCOUNTER — Other Ambulatory Visit: Payer: Self-pay

## 2022-09-19 ENCOUNTER — Emergency Department (HOSPITAL_BASED_OUTPATIENT_CLINIC_OR_DEPARTMENT_OTHER)
Admission: EM | Admit: 2022-09-19 | Discharge: 2022-09-19 | Disposition: A | Discharge status: Home / Self Care | Payer: PPO | Attending: Emergency Medicine | Admitting: Emergency Medicine

## 2022-09-19 ENCOUNTER — Other Ambulatory Visit (HOSPITAL_BASED_OUTPATIENT_CLINIC_OR_DEPARTMENT_OTHER): Payer: Self-pay

## 2022-09-19 ENCOUNTER — Telehealth: Payer: Self-pay | Admitting: Cardiology

## 2022-09-19 DIAGNOSIS — E119 Type 2 diabetes mellitus without complications: Secondary | ICD-10-CM | POA: Diagnosis not present

## 2022-09-19 DIAGNOSIS — Z7901 Long term (current) use of anticoagulants: Secondary | ICD-10-CM | POA: Insufficient documentation

## 2022-09-19 DIAGNOSIS — R195 Other fecal abnormalities: Secondary | ICD-10-CM

## 2022-09-19 DIAGNOSIS — Z79899 Other long term (current) drug therapy: Secondary | ICD-10-CM | POA: Insufficient documentation

## 2022-09-19 DIAGNOSIS — Z7984 Long term (current) use of oral hypoglycemic drugs: Secondary | ICD-10-CM | POA: Diagnosis not present

## 2022-09-19 DIAGNOSIS — K921 Melena: Secondary | ICD-10-CM | POA: Insufficient documentation

## 2022-09-19 DIAGNOSIS — I10 Essential (primary) hypertension: Secondary | ICD-10-CM | POA: Diagnosis not present

## 2022-09-19 DIAGNOSIS — R42 Dizziness and giddiness: Secondary | ICD-10-CM | POA: Insufficient documentation

## 2022-09-19 LAB — COMPREHENSIVE METABOLIC PANEL
ALT: 17 U/L (ref 0–44)
AST: 23 U/L (ref 15–41)
Albumin: 3.7 g/dL (ref 3.5–5.0)
Alkaline Phosphatase: 104 U/L (ref 38–126)
Anion gap: 8 (ref 5–15)
BUN: 23 mg/dL (ref 8–23)
CO2: 29 mmol/L (ref 22–32)
Calcium: 9.7 mg/dL (ref 8.9–10.3)
Chloride: 103 mmol/L (ref 98–111)
Creatinine, Ser: 1.55 mg/dL — ABNORMAL HIGH (ref 0.61–1.24)
GFR, Estimated: 43 mL/min — ABNORMAL LOW (ref >60)
Glucose, Bld: 113 mg/dL — ABNORMAL HIGH (ref 70–99)
Potassium: 3.8 mmol/L (ref 3.5–5.1)
Sodium: 140 mmol/L (ref 135–145)
Total Bilirubin: 0.7 mg/dL (ref 0.3–1.2)
Total Protein: 7.3 g/dL (ref 6.5–8.1)

## 2022-09-19 LAB — URINALYSIS, ROUTINE W REFLEX MICROSCOPIC
Bilirubin Urine: NEGATIVE
Glucose, UA: NEGATIVE mg/dL
Hgb urine dipstick: NEGATIVE
Ketones, ur: NEGATIVE mg/dL
Leukocytes,Ua: NEGATIVE
Nitrite: NEGATIVE
Specific Gravity, Urine: 1.009 (ref 1.005–1.030)
pH: 5.5 (ref 5.0–8.0)

## 2022-09-19 LAB — OCCULT BLOOD X 1 CARD TO LAB, STOOL: Fecal Occult Bld: NEGATIVE

## 2022-09-19 LAB — CBC
HCT: 41.2 % (ref 39.0–52.0)
Hemoglobin: 14.5 g/dL (ref 13.0–17.0)
MCH: 32.4 pg (ref 26.0–34.0)
MCHC: 35.2 g/dL (ref 30.0–36.0)
MCV: 92 fL (ref 80.0–100.0)
Platelets: 159 10*3/uL (ref 150–400)
RBC: 4.48 MIL/uL (ref 4.22–5.81)
RDW: 12.7 % (ref 11.5–15.5)
WBC: 6.2 10*3/uL (ref 4.0–10.5)
nRBC: 0 % (ref 0.0–0.2)

## 2022-09-19 LAB — CBG MONITORING, ED: Glucose-Capillary: 131 mg/dL — ABNORMAL HIGH (ref 70–99)

## 2022-09-19 MED ORDER — OMEPRAZOLE 20 MG PO CPDR
20.0000 mg | DELAYED_RELEASE_CAPSULE | Freq: Every day | ORAL | 0 refills | Status: DC
  Filled 2022-09-19: qty 14, 14d supply, fill #0

## 2022-09-19 MED ORDER — PANTOPRAZOLE SODIUM 40 MG IV SOLR
40.0000 mg | Freq: Once | INTRAVENOUS | Status: AC
  Administered 2022-09-19: 40 mg via INTRAVENOUS
  Filled 2022-09-19: qty 10

## 2022-09-19 MED ORDER — SODIUM CHLORIDE 0.9 % IV BOLUS (SEPSIS)
500.0000 mL | Freq: Once | INTRAVENOUS | Status: AC
  Administered 2022-09-19: 500 mL via INTRAVENOUS

## 2022-09-19 NOTE — Telephone Encounter (Signed)
Pt c/o medication issue:  1. Name of Medication:   apixaban (ELIQUIS) 5 MG TABS tablet    2. How are you currently taking this medication (dosage and times per day)? Take 1 tablet (5 mg total) by mouth 2 (two) times daily.   3. Are you having a reaction (difficulty breathing--STAT)? No  4. What is your medication issue? Pt's daughter would like to know if instructions can be changed to once daily due to it causing dizziness.

## 2022-09-19 NOTE — ED Notes (Signed)
Pt given discharge instructions and reviewed prescriptions. Opportunities given for questions. Pt verbalizes understanding. PIV removed x1. Leanne Chang, RN

## 2022-09-19 NOTE — Telephone Encounter (Signed)
I suspect dark stools are from pepto. We could have seen him for this- Triage has very low threshold to send to ED unfortunately- he is already in ED so should finish assessment

## 2022-09-19 NOTE — Telephone Encounter (Signed)
FYI

## 2022-09-19 NOTE — Telephone Encounter (Signed)
Daughter states triage nurse recommended pt go to ED but pt declined. States that patient believed dark stools were due to using Pepto Bismol. Daughter states triage nurse recommended they call PCP office.   Daughter states that currently patient is experiencing dizziness/lightheadedness and ongoing dark stools. Patient has been transferred to triage for new symptoms.

## 2022-09-19 NOTE — Telephone Encounter (Signed)
Final Disposition is Go To ED Now. Patient is currently at ED.   Patient Name: Calvin Burnett Gender: Male DOB: 01-09-1934 Age: 89 Y 9 M 19 D Return Phone Number: [phone] (Primary), [phone] (Secondary) Address: City/ State/ Zip: [place]  [address] Client [place] [facility] [place] [facility] [place] [facility] [employer] [facility] Provider [name]- MD Contact Type Call Who Is Calling Patient / Member / Family / Caregiver Call Type Triage / Clinical Caller Name Lillia Carmel Relationship To Patient Daughter Return Phone Number [phone] (Secondary) Chief Complaint Dizziness Reason for Call Symptomatic / Request for [place] states that her father is light headed and dizzy. He also has dark stools. Translation No Nurse Assessment Nurse: Quentin Ore, RN, April Date/Time Eilene Ghazi Time): 09/19/2022 11:11:42 AM Confirm and document reason for call. If symptomatic, describe symptoms. ---Caller states father has dizziness that started this am and dark stools since Sunday. Does the patient have any new or worsening symptoms? ---Yes Will a triage be completed? ---Yes Related visit to physician within the last 2 weeks? ---No Does the PT have any chronic conditions? (i.e. diabetes, asthma, this includes High risk factors for pregnancy, etc.) ---Yes List chronic conditions. ---afib, and heart monitor, and is scheduled for a PET scan. Is this a behavioral health or substance abuse call? ---No Guidelines Guideline Title Affirmed Question Affirmed Notes Nurse Date/Time (Eastern Time) Rectal Bleeding Black or tarry bowel movements (Exception: Chronicunchanged black-grey BMs AND is taking iron pills or PeptoBismol.) Quentin Ore, RN, April 09/19/2022 11:14:28 AM PLEASE NOTE: All timestamps contained within this report are represented as [place] Standard Time. CONFIDENTIALTY NOTICE: This fax  transmission is intended only for the addressee. It contains information that is legally privileged, confidential or otherwise protected from use or disclosure. If you are not the intended recipient, you are strictly prohibited from reviewing, disclosing, copying using or disseminating any of this information or taking any action in reliance on or regarding this information. If you have received this fax in error, please notify us immediately by telephone so that we can arrange for its return to [place]. Phone: [phone], Toll-Free: [phone], Fax: [phone] Page: 2 of 2 Call Id: [id number] [place]. Time Eilene Ghazi Time) Disposition Final User 09/19/2022 11:20:55 AM Go to ED Now Yes Quentin Ore, RN, April Final Disposition 09/19/2022 11:20:55 AM Go to ED Now Yes Quentin Ore, RN, April Caller Disagree/Comply Comply Caller Understands Yes PreDisposition InappropriateToAsk Care Advice Given Per Guideline GO TO ED NOW: * You need to be seen in the Emergency Department. * Go to the ED at ___________ [place] now. Drive carefully. BRING MEDICINES: * Bring a list of your current medicines when you go to the Emergency Department (ER). * Bring the pill bottles too. This will help the doctor (or NP/PA) to make certain you are taking the right medicines and the right dose. CARE ADVICE given per Rectal Bleeding (Adult) guideline. Referrals [place] - ED

## 2022-09-19 NOTE — ED Provider Notes (Signed)
Calvin Burnett   CSN: [id number] Arrival date & time: 09/19/22  1132     History  Chief Complaint  Patient presents with   Dizziness   Blood In Stools    Calvin Burnett is a 89 y.o. male.  With PMH of HTN, GERD, DM, A-fib on Eliquis, internal hemorrhoids presenting with 4 to 5 days of dark stools.  His last dose of Eliquis was this morning.  He has not had any hematemesis or bright red blood per rectum.  He is having 1-2 bowel movements daily.  He endorses some mild lightheadedness upon standing or walking.  Denies any history of peptic ulcer disease or GERD.  He has had no syncopal episodes, no chest pain, no shortness of breath, no fevers, no chills, no abdominal pain, no urinary symptoms.  Has been taking Pepto-Bismol but recently discontinued and still has some darker bowel movements.  He is accompanied by his daughter who has not seen his bowel movements.   Dizziness      Home Medications Prior to Admission medications   Medication Sig Start Date End Date Taking? Authorizing Provider  apixaban (ELIQUIS) 5 MG TABS tablet Take 1 tablet (5 mg total) by mouth 2 (two) times daily. 09/10/22  Yes Camnitz, Will Hassell Done, MD  cyanocobalamin 1000 MCG tablet Take 1,000 mcg by mouth daily.   Yes [provider]  latanoprost (XALATAN) 0.005 % ophthalmic solution Place 1 drop into both eyes at bedtime.  08/05/15  Yes [provider]  memantine (NAMENDA XR) 28 MG CP24 24 hr capsule TAKE 1 CAPSULE BY MOUTH DAILY. 08/19/22  Yes Rondel Jumbo, PA-C  metFORMIN (GLUCOPHAGE-XR) 500 MG 24 hr tablet TAKE 1 TABLET BY MOUTH EVERY DAY WITH BREAKFAST 07/16/22  Yes Marin Olp, MD  omeprazole (PRILOSEC) 20 MG capsule Take 1 capsule (20 mg total) by mouth daily. 09/19/22  Yes Elgie Congo, MD  rosuvastatin (CRESTOR) 20 MG tablet TAKE 1 TABLET (20 MG TOTAL) BY MOUTH ONCE A WEEK. 05/24/22  Yes Marin Olp, MD  blood glucose  meter kit and supplies KIT Dispense based on patient and insurance preference. Use up to four times daily as directed. Dx E11.9 03/10/20   Marin Olp, MD      Allergies    Kcentra [prothrombin complex conc human]    Review of Systems   Review of Systems  Neurological:  Positive for dizziness.    Physical Exam Updated Vital Signs BP (!) 174/83   Pulse 63   Temp 97.7 F (36.5 C) (Oral)   Resp 14   Ht '5\' 10"'$  (1.778 m)   Wt 74.8 kg   SpO2 100%   BMI 23.68 kg/m  Physical Exam Constitutional: Alert and oriented.  No acute distress and nontoxic Eyes: Conjunctivae are normal. ENT      Head: Normocephalic and atraumatic.      Nose: No congestion.      Mouth/Throat: Mucous membranes are moist.      Neck: No stridor. Cardiovascular: S1, S2, bradycardic, normal and symmetric distal pulses are present in all extremities.Warm and well perfused. Respiratory: Normal respiratory effort.  O2 sat 100 on RA Gastrointestinal: Soft and nontender.  Rectal exam performed with bedside RN, there is no external hemorrhoids noted, no active bright red blood per rectum, stool exam performed with dark brown stool slightly loose but not melena Musculoskeletal: Normal range of motion in all extremities. No pitting edema of lower extremities  Neurologic: Normal speech and language.  Moving extremities x 4.  No facial droop.  Sensation grossly intact. Skin: Skin is warm, dry and intact. No rash noted. Psychiatric: Mood and affect are normal. Speech and behavior are normal.  ED Results / Procedures / Treatments   Labs (all labs ordered are listed, but only abnormal results are displayed) Labs Reviewed  URINALYSIS, ROUTINE W REFLEX MICROSCOPIC - Abnormal; Notable for the following components:      Result Value   Color, Urine COLORLESS (*)    Protein, ur TRACE (*)    All other components within normal limits  COMPREHENSIVE METABOLIC PANEL - Abnormal; Notable for the following components:   Glucose,  Bld 113 (*)    Creatinine, Ser 1.55 (*)    GFR, Estimated 43 (*)    All other components within normal limits  CBG MONITORING, ED - Abnormal; Notable for the following components:   Glucose-Capillary 131 (*)    All other components within normal limits  CBC  OCCULT BLOOD X 1 CARD TO LAB, STOOL    EKG EKG Interpretation  Date/Time:  Thursday September 19 2022 11:41:53 EST Ventricular Rate:  50 PR Interval:    QRS Duration: 97 QT Interval:  434 QTC Calculation: 396 R Axis:   152 Text Interpretation: Atrial fibrillation No significant change since last tracing Confirmed by Georgina Snell [phone]) on 09/19/2022 11:48:22 AM  Radiology No results found.  Procedures Procedures  Renal labs and cardiac monitoring, atrial fibrillation ranging from bradycardic to normal rates.  Medications Ordered in ED Medications  pantoprazole (PROTONIX) injection 40 mg (40 mg Intravenous Given 09/19/22 1336)  sodium chloride 0.9 % bolus 500 mL (500 mLs Intravenous New Bag/Given 09/19/22 1335)    ED Course/ Medical Decision Making/ A&P                            Medical Decision Making  Calvin Burnett is a 88 y.o. male.  With PMH of HTN, GERD, DM, A-fib on Eliquis, internal hemorrhoids presenting with 4 to 5 days of dark stools.   Suspect patient's dark stools is likely from possible diarrhea or Pepto-Bismol use.  I performed rectal exam which showed no bright red blood per rectum and Hemoccult was negative.  His exam was also not concerning for melena.  His abdominal exam is benign and he has no hematemesis or vomiting.  Labs are obtained and unremarkable with normal hemoglobin 14.5 normal platelet count 159, creatinine 1.55 slightly elevated from baseline.  No acute electrolyte abnormalities.  UA without evidence of UTI.  There was trace protein present.  Suspect mild dehydration from diarrhea.  Given IV fluids and IV protonix with improvement.  Was ambulated around room with no further symptoms.   Discussed close follow-up with PCP and strict return precautions.  Gave prescription for as needed omeprazole.  Safe for discharge home and discharged in stable good condition.  Amount and/or Complexity of Data Reviewed Labs: ordered.  Risk Prescription drug management.    Final Clinical Impression(s) / ED Diagnoses Final diagnoses:  Dark stools    Rx / DC Orders ED Discharge Orders          Ordered    omeprazole (PRILOSEC) 20 MG capsule  Daily        09/19/22 1441              Elgie Congo, MD 09/20/22 [phone]

## 2022-09-19 NOTE — Discharge Instructions (Addendum)
You were seen in the emergency department today for fatigue and dark stools.  Your blood levels were normal and there is no evidence of blood in your stools.  Continue to drink fluids and take the omeprazole daily as prescribed.  Monitor for any worsening abdominal pain, vomiting blood, bright red blood in your stool, fainting episode, chest pain or shortness of breath or any other symptoms concerning to you.  Use can follow-up with your primary care doctor regarding your visit to the ER today.  If you are still having issues with bowel movements or abnormal stools make an appointment with GI for any further workup.

## 2022-09-19 NOTE — ED Triage Notes (Signed)
Pt to ED c/o dark tarry stools x 5 days and dizziness that started today. Off note pt currently being monitored on heart monitor for low HR. Denies chest pain.ambulatory in triage.

## 2022-09-19 NOTE — Telephone Encounter (Signed)
Spoke to dtr. Informed that pt cannot reduce to once daily as it would not be therapeutic. Advised to see GI MD asap to rule out cause, pt has hx of internal hemorrhoids. They will call office back if bleeding reoccurs before seeing GI  She appreciates the call and agreeable to pt continuing Eliquis BID.

## 2022-09-19 NOTE — Telephone Encounter (Signed)
Pt was seen in the ED today due to dark stools. It was advised by ED staff for the patient to contact the cardiologist on advise pertaining to Eliquis. Pt's daughter wanted to know if her father can decrease his dosage of Eliquis from 2 tablets daily to once a day. Will forward to MD and nurse.

## 2022-09-23 ENCOUNTER — Ambulatory Visit: Payer: PPO | Admitting: Physician Assistant

## 2022-09-23 ENCOUNTER — Other Ambulatory Visit (INDEPENDENT_AMBULATORY_CARE_PROVIDER_SITE_OTHER): Payer: PPO

## 2022-09-23 ENCOUNTER — Encounter: Payer: Self-pay | Admitting: Physician Assistant

## 2022-09-23 VITALS — BP 110/68 | HR 55 | Ht 70.0 in | Wt 165.0 lb

## 2022-09-23 DIAGNOSIS — R195 Other fecal abnormalities: Secondary | ICD-10-CM

## 2022-09-23 DIAGNOSIS — R197 Diarrhea, unspecified: Secondary | ICD-10-CM

## 2022-09-23 LAB — CBC WITH DIFFERENTIAL/PLATELET
Basophils Absolute: 0.1 10*3/uL (ref 0.0–0.1)
Basophils Relative: 1.4 % (ref 0.0–3.0)
Eosinophils Absolute: 0.1 10*3/uL (ref 0.0–0.7)
Eosinophils Relative: 2.1 % (ref 0.0–5.0)
HCT: 45.1 % (ref 39.0–52.0)
Hemoglobin: 15.1 g/dL (ref 13.0–17.0)
Lymphocytes Relative: 26 % (ref 12.0–46.0)
Lymphs Abs: 1.7 10*3/uL (ref 0.7–4.0)
MCHC: 33.5 g/dL (ref 30.0–36.0)
MCV: 96 fl (ref 78.0–100.0)
Monocytes Absolute: 0.5 10*3/uL (ref 0.1–1.0)
Monocytes Relative: 8.2 % (ref 3.0–12.0)
Neutro Abs: 4.1 10*3/uL (ref 1.4–7.7)
Neutrophils Relative %: 62.3 % (ref 43.0–77.0)
Platelets: 183 10*3/uL (ref 150.0–400.0)
RBC: 4.7 Mil/uL (ref 4.22–5.81)
RDW: 13.5 % (ref 11.5–15.5)
WBC: 6.5 10*3/uL (ref 4.0–10.5)

## 2022-09-23 NOTE — Patient Instructions (Addendum)
_______________________________________________________  If your blood pressure at your visit was 140/90 or greater, please contact your primary care physician to follow up on this.  _______________________________________________________  If you are age 87 or older, your body mass index should be between 23-30. Your Body mass index is 23.68 kg/m. If this is out of the aforementioned range listed, please consider follow up with your Primary Care Provider.  If you are age 11 or younger, your body mass index should be between 19-25. Your Body mass index is 23.68 kg/m. If this is out of the aformentioned range listed, please consider follow up with your Primary Care Provider.   ________________________________________________________  The [facility] providers would like to encourage you to use [facility] to communicate with providers for non-urgent requests or questions.  Due to long hold times on the telephone, sending your provider a message by [facility] may be a faster and more efficient way to get a response.  Please allow 48 business hours for a response.  Please remember that this is for non-urgent requests.  _______________________________________________________  Your provider has requested that you go to the basement level for lab work before leaving today. Press "B" on the elevator. The lab is located at the first door on the left as you exit the elevator.  Stop Pepto bismol    Use Omeprazole as needed  Use over the counter Imodium for diarrhea as needed.  Thank you for entrusting me with your care and choosing [facility].  Nicoletta Ba , PA,C

## 2022-09-23 NOTE — Progress Notes (Unsigned)
Subjective:    Patient ID: Calvin Burnett, male    DOB: 04-07-1934, 89 y.o.   MRN: [id number]  HPI Calvin Burnett is a pleasant 89 year old white male, established with Dr. Fuller Plan.  He has history of right hemicolectomy for a T1 N0 colon adenocarcinoma arising in a 4 cm polyp, and then also had sigmoidectomy for diverticular stricture in May 2019.  He required a lysis of adhesions 2020. He last had colonoscopy in July 2020 that showed a side-to-side ileocolonic anastomosis in the transverse colon and a prior end-to-end colocolonic anastomosis of the sigmoid colon as well as multiple diverticuli and internal hemorrhoids.  There were no polyps.  Due to absence of polyps and advanced age decision was made not to pursue further colonoscopies. He comes in today after an episode last week of what he describes as very dark blackish stools which occurred over a couple of days.  Patient says that he had an upset stomach and on further questioning this was diarrhea.  He started taking Pepto-Bismol which she says did help with the diarrhea but then started noticing the very dark stools.  He did not have any nausea or vomiting, no significant abdominal pain or cramping no fever. He is on chronic Eliquis and due to concerns for bleeding, he was evaluated in the emergency room on 09/19/2022.  Hemoccult was done and negative. Labs showed WBC of 6.2/hemoglobin 14.5/hematocrit 41.5-very stable. He is in the process of undergoin a cardiac workup and is scheduled for a cardiac PET scan.  He has been having issues with bradycardia per his son and has history of atrial fibrillation.  He is currently wearing a heart monitor.  He has history of mild aortic stenosis, adult onset diabetes mellitus, BPPV, vascular dementia for which she is on Namenda, chronic kidney disease stage III and prior history of prostate cancer for which she had brachytherapy.   Again today patient says he feels fine and felt fine this weekend, bowel movements  have returned to normal color and he said he had a normal formed stool this morning.  Appetite is fine he is eating without difficulty no nausea or abdominal discomfort.  He had been given a prescription for omeprazole 20 mg daily to use as needed after the ER visit.  Review of Systems Pertinent positive and negative review of systems were noted in the above HPI section.  All other review of systems was otherwise negative.   Outpatient Encounter Medications as of 09/23/2022  Medication Sig   apixaban (ELIQUIS) 5 MG TABS tablet Take 1 tablet (5 mg total) by mouth 2 (two) times daily.   blood glucose meter kit and supplies KIT Dispense based on patient and insurance preference. Use up to four times daily as directed. Dx E11.9   cyanocobalamin 1000 MCG tablet Take 1,000 mcg by mouth daily.   latanoprost (XALATAN) 0.005 % ophthalmic solution Place 1 drop into both eyes at bedtime.    memantine (NAMENDA XR) 28 MG CP24 24 hr capsule TAKE 1 CAPSULE BY MOUTH DAILY.   metFORMIN (GLUCOPHAGE-XR) 500 MG 24 hr tablet TAKE 1 TABLET BY MOUTH EVERY DAY WITH BREAKFAST   omeprazole (PRILOSEC) 20 MG capsule Take 1 capsule (20 mg total) by mouth daily.   rosuvastatin (CRESTOR) 20 MG tablet TAKE 1 TABLET (20 MG TOTAL) BY MOUTH ONCE A WEEK.   No facility-administered encounter medications on file as of 09/23/2022.   Allergies  Allergen Reactions   Kcentra [Prothrombin Complex Conc Human] Anaphylaxis, Shortness Of Breath  and Other (See Comments)    Patient immediately became short of breath and bright red. Started improving minutes after infusion stopped. Had seizure-like activity, also   Patient Active Problem List   Diagnosis Date Noted   Mild aortic stenosis 04/05/2021   BPPV (benign paroxysmal positional vertigo) 12/17/2019   Gout 10/28/2019   Intra-abdominal abscess ([place]) 04/24/2019   Aortic atherosclerosis ([place]) 01/29/2019   B12 deficiency 12/04/2017   Stricture of sigmoid s/p robotic sigmoidectomy  11/26/2017 11/26/2017   Chronic anticoagulation 11/26/2017   pT1pN0 colon cancer s/p robotic right colectomy 11/26/2017 10/02/2017   Right inguinal hernia 10/02/2017   AF (paroxysmal atrial fibrillation) ([place]) 04/09/2017   Night sweats 09/16/2016   Major depressive disorder with single episode, in full remission ([place]) 07/29/2016   History of adenomatous polyp of colon 10/27/2014   Bowel habit changes 10/14/2014   Rectal pain 10/14/2014   Syncope 09/27/2014   Bradycardia 09/27/2014   CKD (chronic kidney disease), stage III ([place]) 06/01/2014   Hyperlipidemia associated with type 2 diabetes mellitus ([place]) 03/01/2014   Type 2 diabetes mellitus ([place]) 07/31/2010   LEG CRAMPS, NOCTURNAL 04/30/2010   Basal cell carcinoma 05/03/2009   Vascular dementia ([place]) 07/27/2008   Cervical spondylosis without myelopathy 12/11/2007   GERD (gastroesophageal reflux disease) 11/05/2007   Essential hypertension 01/30/2007   Social History   Socioeconomic History   Marital status: Widowed    Spouse name: Not on file   Number of children: 4   Years of education: Not on file   Highest education level: Not on file  Occupational History   Occupation: retired    Fish farm manager: RETIRED  Tobacco Use   Smoking status: Former    Packs/day: 1.00    Years: 2.00    Total pack years: 2.00    Types: Cigars, Cigarettes    Quit date: 07/15/1990    Years since quitting: 32.2   Smokeless tobacco: Never   Tobacco comments:    quit smoking 40 yrs ago  Vaping Use   Vaping Use: Never used  Substance and Sexual Activity   Alcohol use: No   Drug use: No   Sexual activity: Not Currently  Other Topics Concern   Not on file  Social History Narrative   Married 23 years-widowed 2016 when wife Arliss Outhouse passed from pericardial tamponade likely due to MI.  4 kids from previous marriage with over 35 grandkids/greatgrandkids combined.  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       Hobbies: TV, time with family, yardwork      Daughter from [place] assists with medical appointments       Lives alone; still drives       Right handed    Social Determinants of Health   Financial Resource Strain: Not on file  Food Insecurity: No Food Insecurity (05/07/2022)   Hunger Vital Sign    Worried About Running Out of Food in the Last Year: Never true    [place] in the Last Year: Never true  Transportation Needs: No Transportation Needs (05/07/2022)   PRAPARE - Hydrologist (Medical): No    Lack of Transportation (Non-Medical): No  Physical Activity: Not on file  Stress: Not on file  Social Connections: Not on file  Intimate Partner Violence: Not on file    Mr. Santanna family history includes COPD in  his mother; Cancer in his daughter; Diabetes in his daughter and sister; Emphysema in his mother; Lung cancer in his father.      Objective:    Vitals:   09/23/22 1434  BP: 110/68  Pulse: (!) 55  SpO2: 95%    Physical Exam Well-developed well-nourished elderly white male in no acute distress.  Accompanied by son height, Weight, 165 BMI  23.6  HEENT; nontraumatic normocephalic, EOMI, PE R LA, sclera anicteric. Oropharynx; not done today Neck; supple, no JVD Cardiovascular; regular rate and rhythm with [id number], systolic murmur-rate [id number] Pulmonary; Clear bilaterally Abdomen; soft, nontender, nondistended, no palpable mass or hepatosplenomegaly, bowel sounds are active, cholecystectomy scar and midline incisional scar Rectal; not done today, Hemoccult submitted 09/19/2022 negative for occult blood Skin; benign exam, no jaundice rash or appreciable lesions Extremities; no clubbing cyanosis or edema skin warm and dry Neuro/Psych; alert and oriented x4, grossly nonfocal mood and affect appropriate        Assessment & Plan:   #32 89 year old white male with history of dark blackish stools  last week which occurred over a couple of days.  He had started taking Pepto-Bismol because he was having diarrhea, then noted the very dark stools. ER workup on 09/19/2022 with hemoglobin 14.5 very stable and stool Hemoccult negative.  I believe the black his stool was secondary to Pepto-Bismol but no evidence for any GI bleeding  Diarrhea has resolved, stools have returned to normal, no complaints of abdominal discomfort.  #2 history of T1 N0 colonic adenocarcinoma arising in a polyp for which she underwent right hemicolectomy and also had sigmoidectomy for diverticular stricture in May 2019. #3 status post lysis of adhesions 2020 4.  Aortic stenosis 5.  Atrial fibrillation recent issues with bradycardia undergoing further workup 5.  Hypertension 6.  BPPV 7.  Adult onset diabetes mellitus 8.  Chronic kidney disease stage III 9.  Vascular dementia 10.  History of prostate cancer status post brachytherapy  Plan; I do not think he needs to be on omeprazole daily, can use as needed Will repeat CBC today to confirm stability Advised not to use Pepto-Bismol in the future for diarrhea as this will confuse the picture since he is requiring chronic anticoagulation.  Advised to use OTC Imodium if needed in the future for diarrhea  No further GI workup indicated at present.-And his son were advised to call should he have any recurrent issues in the future.  Anniyah Mood Genia Harold PA-C 09/23/2022   Cc: Marin Olp, MD

## 2022-09-24 ENCOUNTER — Telehealth: Payer: Self-pay

## 2022-09-24 NOTE — Patient Outreach (Signed)
  Care Coordination   09/24/2022 Name: BEAR OSTEN MRN: [id number] DOB: May 01, 1934   Care Coordination Outreach Attempts:  An unsuccessful telephone outreach was attempted today to offer the patient information about available care coordination services as a benefit of their health plan.   Follow Up Plan:  Additional outreach attempts will be made to offer the patient care coordination information and services.   Encounter Outcome:  No Answer   Care Coordination Interventions:  No, not indicated      Enzo Montgomery, RN,BSN,CCM [facility] Care Management Care Management Community Coordinator Direct Phone: [phone] Toll Free: [phone] Fax: [phone]

## 2022-09-30 ENCOUNTER — Telehealth (HOSPITAL_COMMUNITY): Payer: Self-pay | Admitting: *Deleted

## 2022-09-30 NOTE — Telephone Encounter (Signed)
Reaching out to patient to offer assistance regarding upcoming cardiac imaging study; pt verbalizes understanding of appt date/time, parking situation and where to check in, pre-test NPO status and verified current allergies; name and call back number provided for further questions should they arise  Calvin Varon RN Navigator Cardiac Imaging [facility] [phone] office [phone] cell  Patient aware to avoid caffeine 12 hours prior to his cardiac PET scan. 

## 2022-10-02 ENCOUNTER — Ambulatory Visit (HOSPITAL_COMMUNITY)
Admission: RE | Admit: 2022-10-02 | Discharge: 2022-10-02 | Disposition: A | Discharge status: Home / Self Care | Payer: PPO | Source: Ambulatory Visit | Attending: Cardiology | Admitting: Cardiology

## 2022-10-02 DIAGNOSIS — R079 Chest pain, unspecified: Secondary | ICD-10-CM | POA: Diagnosis not present

## 2022-10-02 LAB — NM PET CT CARDIAC PERFUSION MULTI W/ABSOLUTE BLOODFLOW
LV dias vol: 102 mL (ref 62–150)
LV sys vol: 35 mL
MBFR: 2.13
Nuc Rest EF: 58 %
Nuc Stress EF: 66 %
Peak HR: 85 {beats}/min
Rest HR: 64 {beats}/min
Rest MBF: 0.8 ml/g/min
Rest Nuclear Isotope Dose: 19.5 mCi
ST Depression (mm): 0 mm
Stress MBF: 1.7 ml/g/min
Stress Nuclear Isotope Dose: 19.5 mCi
TID: 0.98

## 2022-10-02 MED ORDER — REGADENOSON 0.4 MG/5ML IV SOLN: 0.4000 mg | Freq: Once | INTRAVENOUS | Status: AC

## 2022-10-02 MED ORDER — REGADENOSON 0.4 MG/5ML IV SOLN
INTRAVENOUS | Status: AC
  Administered 2022-10-02: 0.4 mg via INTRAVENOUS
  Filled 2022-10-02: qty 5

## 2022-10-02 MED ORDER — RUBIDIUM RB82 GENERATOR (RUBYFILL)
19.5000 | PACK | Freq: Once | INTRAVENOUS | Status: AC
  Administered 2022-10-02: 19.5 via INTRAVENOUS

## 2022-10-04 ENCOUNTER — Telehealth: Payer: Self-pay | Admitting: Cardiology

## 2022-10-04 DIAGNOSIS — R001 Bradycardia, unspecified: Secondary | ICD-10-CM | POA: Diagnosis not present

## 2022-10-04 DIAGNOSIS — I4821 Permanent atrial fibrillation: Secondary | ICD-10-CM | POA: Diagnosis not present

## 2022-10-04 NOTE — Telephone Encounter (Signed)
Follow Up:      Raquel Sarna is calling with abnormal results for his Zio

## 2022-10-04 NOTE — Telephone Encounter (Signed)
Calvin Burnett with [employer] called in with end of summary abnormal results. Print out of report was given to Dr Macky Lower nurse and the final report is posted in [facility] for review.

## 2022-10-09 ENCOUNTER — Telehealth: Payer: Self-pay | Admitting: *Deleted

## 2022-10-09 NOTE — Telephone Encounter (Signed)
Left detailed w/ pt & son informing that after testing review, Dr. Curt Bears recommends 6 mo follow up. Recall placed in epic. Advised to call office with any questions.

## 2022-11-28 ENCOUNTER — Ambulatory Visit (INDEPENDENT_AMBULATORY_CARE_PROVIDER_SITE_OTHER): Payer: PPO | Admitting: Family Medicine

## 2022-11-28 ENCOUNTER — Encounter: Payer: Self-pay | Admitting: Family Medicine

## 2022-11-28 VITALS — BP 138/78 | HR 63 | Temp 98.2°F | Ht 70.0 in | Wt 160.0 lb

## 2022-11-28 DIAGNOSIS — T148XXA Other injury of unspecified body region, initial encounter: Secondary | ICD-10-CM

## 2022-11-28 DIAGNOSIS — I480 Paroxysmal atrial fibrillation: Secondary | ICD-10-CM | POA: Diagnosis not present

## 2022-11-28 DIAGNOSIS — F325 Major depressive disorder, single episode, in full remission: Secondary | ICD-10-CM

## 2022-11-28 DIAGNOSIS — Z7984 Long term (current) use of oral hypoglycemic drugs: Secondary | ICD-10-CM

## 2022-11-28 DIAGNOSIS — N183 Chronic kidney disease, stage 3 unspecified: Secondary | ICD-10-CM

## 2022-11-28 DIAGNOSIS — Z79899 Other long term (current) drug therapy: Secondary | ICD-10-CM

## 2022-11-28 DIAGNOSIS — I10 Essential (primary) hypertension: Secondary | ICD-10-CM | POA: Diagnosis not present

## 2022-11-28 DIAGNOSIS — Z8546 Personal history of malignant neoplasm of prostate: Secondary | ICD-10-CM | POA: Diagnosis not present

## 2022-11-28 DIAGNOSIS — E1122 Type 2 diabetes mellitus with diabetic chronic kidney disease: Secondary | ICD-10-CM | POA: Diagnosis not present

## 2022-11-28 LAB — VITAMIN B12: Vitamin B-12: >1500 pg/mL — ABNORMAL HIGH (ref 211–911)

## 2022-11-28 LAB — COMPREHENSIVE METABOLIC PANEL
ALT: 17 U/L (ref 0–53)
AST: 22 U/L (ref 0–37)
Albumin: 3.9 g/dL (ref 3.5–5.2)
Alkaline Phosphatase: 95 U/L (ref 39–117)
BUN: 23 mg/dL (ref 6–23)
CO2: 30 mEq/L (ref 19–32)
Calcium: 9.2 mg/dL (ref 8.4–10.5)
Chloride: 103 mEq/L (ref 96–112)
Creatinine, Ser: 1.32 mg/dL (ref 0.40–1.50)
GFR: 48.67 mL/min — ABNORMAL LOW (ref >60.00)
Glucose, Bld: 157 mg/dL — ABNORMAL HIGH (ref 70–99)
Potassium: 4.1 mEq/L (ref 3.5–5.1)
Sodium: 141 mEq/L (ref 135–145)
Total Bilirubin: 0.8 mg/dL (ref 0.2–1.2)
Total Protein: 7.1 g/dL (ref 6.0–8.3)

## 2022-11-28 LAB — HEMOGLOBIN A1C: Hgb A1c MFr Bld: 6.8 % — ABNORMAL HIGH (ref 4.6–6.5)

## 2022-11-28 LAB — PSA: PSA: 0.01 ng/mL — ABNORMAL LOW (ref 0.10–4.00)

## 2022-11-28 NOTE — Progress Notes (Signed)
Phone [phone] In person visit   Subjective:   Calvin Burnett is a 89 y.o. year old very pleasant male patient who presents for/with See problem oriented charting Chief Complaint  Patient presents with   Medical Management of Chronic Issues   Hypertension   Diabetes   Past Medical History-  Patient Active Problem List   Diagnosis Date Noted   Intra-abdominal abscess ([name]) 04/24/2019    Priority: High   B12 deficiency 12/04/2017    Priority: High   pT1pN0 colon cancer s/p robotic right colectomy 11/26/2017 10/02/2017    Priority: High   AF (paroxysmal atrial fibrillation) ([name]) 04/09/2017    Priority: High   Night sweats 09/16/2016    Priority: High   Major depressive disorder with single episode, in full remission ([name]) 07/29/2016    Priority: High   Rectal pain 10/14/2014    Priority: High   Type 2 diabetes mellitus ([name]) 07/31/2010    Priority: High   Vascular dementia ([name]) 07/27/2008    Priority: High   Mild aortic stenosis 04/05/2021    Priority: Medium    BPPV (benign paroxysmal positional vertigo) 12/17/2019    Priority: Medium    History of adenomatous polyp of colon 10/27/2014    Priority: Medium    Syncope 09/27/2014    Priority: Medium    CKD (chronic kidney disease), stage III ([name]) 06/01/2014    Priority: Medium    Hyperlipidemia associated with type 2 diabetes mellitus ([name]) 03/01/2014    Priority: Medium    Basal cell carcinoma 05/03/2009    Priority: Medium    Essential hypertension 01/30/2007    Priority: Medium    Gout 10/28/2019    Priority: Low   Aortic atherosclerosis ([name]) 01/29/2019    Priority: Low   Stricture of sigmoid s/p robotic sigmoidectomy 11/26/2017 11/26/2017    Priority: Low   Chronic anticoagulation 11/26/2017    Priority: Low   Right inguinal hernia 10/02/2017    Priority: Low   Bowel habit changes 10/14/2014    Priority: Low   Bradycardia 09/27/2014    Priority: Low   LEG CRAMPS, NOCTURNAL 04/30/2010     Priority: Low   Cervical spondylosis without myelopathy 12/11/2007    Priority: Low   GERD (gastroesophageal reflux disease) 11/05/2007    Priority: Low    Medications- reviewed and updated Current Outpatient Medications  Medication Sig Dispense Refill   apixaban (ELIQUIS) 5 MG TABS tablet Take 1 tablet (5 mg total) by mouth 2 (two) times daily. 60 tablet 6   blood glucose meter kit and supplies KIT Dispense based on patient and insurance preference. Use up to four times daily as directed. Dx E11.9 1 each 0   cyanocobalamin 1000 MCG tablet Take 1,000 mcg by mouth daily.     latanoprost (XALATAN) 0.005 % ophthalmic solution Place 1 drop into both eyes at bedtime.   12   memantine (NAMENDA XR) 28 MG CP24 24 hr capsule TAKE 1 CAPSULE BY MOUTH DAILY. 30 capsule 11   metFORMIN (GLUCOPHAGE-XR) 500 MG 24 hr tablet TAKE 1 TABLET BY MOUTH EVERY DAY WITH BREAKFAST 30 tablet 5   omeprazole (PRILOSEC) 20 MG capsule Take 1 capsule (20 mg total) by mouth daily. 14 capsule 0   rosuvastatin (CRESTOR) 20 MG tablet TAKE 1 TABLET (20 MG TOTAL) BY MOUTH ONCE A WEEK. 13 tablet 3   No current facility-administered medications for this visit.     Objective:  BP 138/78   Pulse 63   Temp  98.2 F (36.8 C)   Ht 5\' 10"  (1.778 m)   Wt 160 lb (72.6 kg)   SpO2 96%   BMI 22.96 kg/m  Gen: NAD, resting comfortably CV: RRR mild SEM Lungs: CTAB no crackles, wheeze, rhonchi Ext: no edema Skin: warm, dry     Assessment and Plan   #History of prostate cancer-will update PSA with labs Lab Results  Component Value Date   PSA 0.01 (L) 07/20/2020   PSA 0.025 07/02/2018   PSA 0.030 11/12/2016   #nonhealing wound- on left forearm for about 6 months- will refer to [facility] dermatology- wonder about skin cancer  #Vascular dementia- Dr. Karel Jarvis S: Patient is compliant with memantine/Namenda 28 mg daily.  Patient has been on memantine for many years.  -memory reasonable stable recently- upcoming visit as well A/P:  thankfully apperas stable- continue current medications and neurology follow up    #Atrial fibrillation S: Patient is anticoagulated with Eliquis 5 mg twice daily - recently increased by Dr. Estill Dooms with kidney function slightly better- mild lightheadedness with this -no longer on rate control due to bradycardia (prior metoprolol 12.5 mg extended release) A/P: appropriately anticoagulated (with eliquis) and rate controlled (without medications) continue current medicine   # Diabetes S: Compliant withMetformin 500 mg XR daily with breakfast CBGs- not checking Exercise and diet- still walking regularly Lab Results  Component Value Date   HGBA1C 6.5 05/30/2022   HGBA1C 6.4 12/06/2021   HGBA1C 6.5 07/17/2021   A/P:  hopefully stable- update a1c today. Continue current meds for now    #hyperlipidemia/history of stroke-may have been atrial fibrillation related S: compliant with rosuvastatin 20 mg Lab Results  Component Value Date   CHOL 98 05/30/2022   HDL 34.50 (L) 05/30/2022   LDLCALC 43 05/30/2022   LDLDIRECT 57.0 11/23/2020   TRIG 103.0 05/30/2022   CHOLHDL 3 05/30/2022  A/P: excellent control- continue current medications    #hypertension/CKD stage III S: Medication: None at present  -Metoprolol 12.5 mg extended release for A-fib but stopped due to bradycardia -Also stopped amlodipine 2.5mg  - stopped hctz 12.5mg  due to loewr BP and dizziness.   CKD stage III with creatinine ranging from 1.3-1.6 for the most part.  Night sweats in the past on ramipril and urine microalbumin to creatinine ratio has not been elevated so we have not restarted ACE inhibitor BP Readings from Last 3 Encounters:  11/28/22 138/78  10/02/22 117/67  09/23/22 110/68  A/P: blood pressure higher than usual - occasional lightheadedness- hold off on pushing blood pressure any lower in light of this CKD III- hopefully stable- update cmp today. Continue without meds for now  - if we get persistent readings  over 1.5 then may need to reduce eliquis again but has fluctuated lately (I would say 3 readings in a row)  # B12 taking again- update level- low acceptable last visit. Proton pump inhibitor (PPI) stomach acid reducer use may contribute but not needing much  #mild lightheadedness- mild aortic stenosis in 2020- offered repeat- he wants to hold off unless worsens  Recommended follow up: Return in about 6 months (around 05/31/2023) for physical or sooner if needed.Schedule b4 you leave. Future Appointments  Date Time Provider Department Center  02/06/2023 11:30 AM Marcos Eke, PA-C [facility] None   Lab/Order associations:   ICD-10-CM   1. Type 2 diabetes mellitus with chronic kidney disease, without long-term current use of insulin, unspecified CKD stage ([name])  E11.22 Hemoglobin A1c    Comprehensive metabolic panel  2. AF (paroxysmal atrial fibrillation) ([name])  I48.0     3. Major depressive disorder with single episode, in full remission ([name])  F32.5     4. Essential hypertension  I10     5. History of prostate cancer  Z85.46 PSA    6. Nonhealing nonsurgical wound  T14.8XXA Ambulatory referral to Dermatology    7. High risk medication use  Z79.899 Vitamin B12     Return precautions advised.  Tana Conch, MD

## 2022-11-28 NOTE — Patient Instructions (Addendum)
Get diabetic eye exam scheduled.  We have placed a referral for you today to [facility] dermatology. you should receive a mychart message or phone call within a week with the # to call to schedule - reach out to [place] if you haven't found anything within 2 weeks  Please stop by lab before you go If you have mychart- we will send your results within 3 business days of [place] receiving them.  If you do not have mychart- we will call you about results within 5 business days of [place] receiving them.  *please also note that you will see labs on mychart as soon as they post. I will later go in and write notes on them- will say "notes from Dr. Durene Cal"   You are eligible to schedule your annual wellness visit with our nurse specialist Inetta Fermo.  Please consider scheduling this before you leave today  Recommended follow up: Return in about 6 months (around 05/31/2023) for physical or sooner if needed.Schedule b4 you leave.

## 2022-12-02 ENCOUNTER — Encounter: Payer: Self-pay | Admitting: Family Medicine

## 2022-12-04 ENCOUNTER — Other Ambulatory Visit (HOSPITAL_BASED_OUTPATIENT_CLINIC_OR_DEPARTMENT_OTHER): Payer: Self-pay

## 2022-12-04 ENCOUNTER — Emergency Department (HOSPITAL_BASED_OUTPATIENT_CLINIC_OR_DEPARTMENT_OTHER)
Admission: EM | Admit: 2022-12-04 | Discharge: 2022-12-04 | Disposition: A | Discharge status: Home / Self Care | Payer: PPO | Attending: Emergency Medicine | Admitting: Emergency Medicine

## 2022-12-04 ENCOUNTER — Other Ambulatory Visit: Payer: Self-pay

## 2022-12-04 ENCOUNTER — Emergency Department (HOSPITAL_COMMUNITY): Payer: PPO

## 2022-12-04 ENCOUNTER — Emergency Department (HOSPITAL_BASED_OUTPATIENT_CLINIC_OR_DEPARTMENT_OTHER): Payer: PPO | Admitting: Radiology

## 2022-12-04 ENCOUNTER — Encounter (HOSPITAL_BASED_OUTPATIENT_CLINIC_OR_DEPARTMENT_OTHER): Payer: Self-pay | Admitting: Emergency Medicine

## 2022-12-04 ENCOUNTER — Emergency Department (HOSPITAL_BASED_OUTPATIENT_CLINIC_OR_DEPARTMENT_OTHER): Payer: PPO

## 2022-12-04 DIAGNOSIS — I4891 Unspecified atrial fibrillation: Secondary | ICD-10-CM | POA: Diagnosis not present

## 2022-12-04 DIAGNOSIS — F0390 Unspecified dementia without behavioral disturbance: Secondary | ICD-10-CM | POA: Insufficient documentation

## 2022-12-04 DIAGNOSIS — E119 Type 2 diabetes mellitus without complications: Secondary | ICD-10-CM | POA: Diagnosis not present

## 2022-12-04 DIAGNOSIS — R410 Disorientation, unspecified: Secondary | ICD-10-CM

## 2022-12-04 DIAGNOSIS — N189 Chronic kidney disease, unspecified: Secondary | ICD-10-CM | POA: Diagnosis not present

## 2022-12-04 DIAGNOSIS — Z7901 Long term (current) use of anticoagulants: Secondary | ICD-10-CM | POA: Insufficient documentation

## 2022-12-04 DIAGNOSIS — G9389 Other specified disorders of brain: Secondary | ICD-10-CM | POA: Diagnosis not present

## 2022-12-04 DIAGNOSIS — G319 Degenerative disease of nervous system, unspecified: Secondary | ICD-10-CM | POA: Diagnosis not present

## 2022-12-04 DIAGNOSIS — Z8659 Personal history of other mental and behavioral disorders: Secondary | ICD-10-CM

## 2022-12-04 DIAGNOSIS — R4182 Altered mental status, unspecified: Secondary | ICD-10-CM | POA: Diagnosis not present

## 2022-12-04 DIAGNOSIS — I6782 Cerebral ischemia: Secondary | ICD-10-CM | POA: Diagnosis not present

## 2022-12-04 LAB — COMPREHENSIVE METABOLIC PANEL
ALT: 17 U/L (ref 0–44)
AST: 22 U/L (ref 15–41)
Albumin: 4.2 g/dL (ref 3.5–5.0)
Alkaline Phosphatase: 123 U/L (ref 38–126)
Anion gap: 8 (ref 5–15)
BUN: 22 mg/dL (ref 8–23)
CO2: 26 mmol/L (ref 22–32)
Calcium: 9.3 mg/dL (ref 8.9–10.3)
Chloride: 104 mmol/L (ref 98–111)
Creatinine, Ser: 1.4 mg/dL — ABNORMAL HIGH (ref 0.61–1.24)
GFR, Estimated: 49 mL/min — ABNORMAL LOW (ref >60)
Glucose, Bld: 181 mg/dL — ABNORMAL HIGH (ref 70–99)
Potassium: 3.9 mmol/L (ref 3.5–5.1)
Sodium: 138 mmol/L (ref 135–145)
Total Bilirubin: 0.8 mg/dL (ref 0.3–1.2)
Total Protein: 7.6 g/dL (ref 6.5–8.1)

## 2022-12-04 LAB — CBC WITH DIFFERENTIAL/PLATELET
Abs Immature Granulocytes: 0.03 10*3/uL (ref 0.00–0.07)
Basophils Absolute: 0.1 10*3/uL (ref 0.0–0.1)
Basophils Relative: 1 %
Eosinophils Absolute: 0.1 10*3/uL (ref 0.0–0.5)
Eosinophils Relative: 2 %
HCT: 43.2 % (ref 39.0–52.0)
Hemoglobin: 15 g/dL (ref 13.0–17.0)
Immature Granulocytes: 1 %
Lymphocytes Relative: 32 %
Lymphs Abs: 1.8 10*3/uL (ref 0.7–4.0)
MCH: 31.8 pg (ref 26.0–34.0)
MCHC: 34.7 g/dL (ref 30.0–36.0)
MCV: 91.5 fL (ref 80.0–100.0)
Monocytes Absolute: 0.4 10*3/uL (ref 0.1–1.0)
Monocytes Relative: 8 %
Neutro Abs: 3.1 10*3/uL (ref 1.7–7.7)
Neutrophils Relative %: 56 %
Platelets: 148 10*3/uL — ABNORMAL LOW (ref 150–400)
RBC: 4.72 MIL/uL (ref 4.22–5.81)
RDW: 13.5 % (ref 11.5–15.5)
WBC: 5.5 10*3/uL (ref 4.0–10.5)
nRBC: 0 % (ref 0.0–0.2)

## 2022-12-04 LAB — URINALYSIS, ROUTINE W REFLEX MICROSCOPIC
Bacteria, UA: NONE SEEN
Bilirubin Urine: NEGATIVE
Glucose, UA: NEGATIVE mg/dL
Hgb urine dipstick: NEGATIVE
Ketones, ur: NEGATIVE mg/dL
Leukocytes,Ua: NEGATIVE
Nitrite: NEGATIVE
Protein, ur: 30 mg/dL — AB
Specific Gravity, Urine: 1.009 (ref 1.005–1.030)
pH: 6 (ref 5.0–8.0)

## 2022-12-04 LAB — CBG MONITORING, ED: Glucose-Capillary: 144 mg/dL — ABNORMAL HIGH (ref 70–99)

## 2022-12-04 NOTE — ED Provider Notes (Signed)
Patient received as transfer from [facility] EDP Dr. Eloise Harman who sent the patient via transfer for MRI. Please see their note for further information.  Briefly: Patient initially presented for altered mental status. Hx of vascular dementia is somewhat altered at baseline. AMS event occurred on Saturday, patient is currently back to baseline. Family concerned for stroke. Hx same, no deficits.   Ddx: Delirium, infection, electrolyte abnormality, ICH, stroke, worsening dementia   Plan: labs and CT imaging reassuring, patient sent for MRI brain to r/o stroke. If normal, patient can likely be discharged home.   Physical Exam  BP 133/69 (BP Location: Right Arm)   Pulse (!) 55   Temp 97.7 F (36.5 C) (Oral)   Resp 16   SpO2 100%   Physical Exam Vitals and nursing note reviewed.  Constitutional:      General: He is not in acute distress.    Appearance: Normal appearance. He is normal weight. He is not ill-appearing, toxic-appearing or diaphoretic.  HENT:     Head: Normocephalic and atraumatic.  Cardiovascular:     Rate and Rhythm: Normal rate.  Pulmonary:     Effort: Pulmonary effort is normal. No respiratory distress.  Musculoskeletal:        General: Normal range of motion.     Cervical back: Normal range of motion.  Skin:    General: Skin is warm and dry.  Neurological:     General: No focal deficit present.     Mental Status: He is alert.  Psychiatric:        Mood and Affect: Mood normal.        Behavior: Behavior normal.     Procedures  Procedures  ED Course / MDM    Medical Decision Making Amount and/or Complexity of Data Reviewed Labs: ordered. Radiology: ordered.   Patient presents today initially for altered mental status.  He is afebrile, nontoxic-appearing, and in no acute distress with reassuring vital signs.  He is also at his neurological baseline per family at bedside.  Patient's MRI of his brain has resulted and reveals  1. No acute  abnormality. 2. Chronic right temporal lobe infarct.  I have personally reviewed and interpreted this imaging and agree with radiology interpretation.  Discussed these findings with family at bedside to state that given the normal results they feel ready to go home per previous providers recommendations.  Patient continues to be at his neurologic baseline.  They already have close neurology follow-up. Evaluation and diagnostic testing in the emergency department does not suggest an emergent condition requiring admission or immediate intervention beyond what has been performed at this time.  Plan for discharge with close PCP follow-up.  Patient is understanding and amenable with plan, educated on red flag symptoms that would prompt immediate return.  Patient discharged in stable condition.    Vear Clock 12/04/22 1703    Arby Barrette, MD 12/06/22 Moses Manners

## 2022-12-04 NOTE — ED Provider Notes (Signed)
[place] EMERGENCY DEPARTMENT AT [facility] Provider Note   CSN: [id number] Arrival date & time: 12/04/22  4098     History  Chief Complaint  Patient presents with   Altered Mental Status    Calvin Burnett is a 89 y.o. male.  89 year old male with a history of vascular dementia, reported stroke without residual deficits, atrial fibrillation on Eliquis, diabetes, hyperlipidemia, and CKD who presents emergency department with altered mental status.  On Saturday the patient was called by his sister who reported that he appeared to be confused and was crying.  Other family members say that he appeared confused as well.  Says that he did not know where he was while he was at home.  Occasionally would have delayed responses to questions but no slurred speech.  No facial droop.  No weakness or numbness of his arms or legs.  They were concerned about his altered mental status so brought him to the emergency department.  Currently lives at home but his daughter is planning on staying with him.  Denies any other recent illnesses.       Home Medications Prior to Admission medications   Medication Sig Start Date End Date Taking? Authorizing Provider  apixaban (ELIQUIS) 5 MG TABS tablet Take 1 tablet (5 mg total) by mouth 2 (two) times daily. 09/10/22  Yes Camnitz, Andree Coss, MD  cyanocobalamin 1000 MCG tablet Take 1,000 mcg by mouth 2 (two) times daily.   Yes [provider]  latanoprost (XALATAN) 0.005 % ophthalmic solution Place 1 drop into both eyes at bedtime.  08/05/15  Yes [provider]  memantine (NAMENDA XR) 28 MG CP24 24 hr capsule TAKE 1 CAPSULE BY MOUTH DAILY. 08/19/22  Yes Marcos Eke, PA-C  metFORMIN (GLUCOPHAGE-XR) 500 MG 24 hr tablet TAKE 1 TABLET BY MOUTH EVERY DAY WITH BREAKFAST 07/16/22  Yes Shelva Majestic, MD  metoprolol succinate (TOPROL-XL) 25 MG 24 hr tablet Take 12.5 mg by mouth daily. 09/27/22  Yes [provider]  rosuvastatin  (CRESTOR) 20 MG tablet TAKE 1 TABLET (20 MG TOTAL) BY MOUTH ONCE A WEEK. 05/24/22  Yes Shelva Majestic, MD  blood glucose meter kit and supplies KIT Dispense based on patient and insurance preference. Use up to four times daily as directed. Dx E11.9 03/10/20   Shelva Majestic, MD  omeprazole (PRILOSEC) 20 MG capsule Take 1 capsule (20 mg total) by mouth daily. Patient not taking: Reported on 12/04/2022 09/19/22   Mardene Sayer, MD      Allergies    Kcentra [prothrombin complex conc human]    Review of Systems   Review of Systems  Physical Exam Updated Vital Signs BP 133/69 (BP Location: Right Arm)   Pulse (!) 55   Temp 97.7 F (36.5 C) (Oral)   Resp 16   SpO2 100%  Physical Exam Vitals and nursing note reviewed.  Constitutional:      General: He is not in acute distress.    Appearance: He is well-developed.     Comments: Alert oriented to place and person but was unaware of the date.  Thought it was 2026.  This appears to be baseline per his daughter.  HENT:     Head: Normocephalic and atraumatic.     Right Ear: External ear normal.     Left Ear: External ear normal.     Nose: Nose normal.  Eyes:     Extraocular Movements: Extraocular movements intact.     Conjunctiva/sclera:  Conjunctivae normal.     Pupils: Pupils are equal, round, and reactive to light.  Cardiovascular:     Rate and Rhythm: Normal rate and regular rhythm.     Heart sounds: Normal heart sounds.  Pulmonary:     Effort: Pulmonary effort is normal. No respiratory distress.     Breath sounds: Normal breath sounds.  Abdominal:     General: There is no distension.     Palpations: Abdomen is soft. There is no mass.     Tenderness: There is no abdominal tenderness. There is no guarding.  Musculoskeletal:     Cervical back: Normal range of motion and neck supple.     Right lower leg: No edema.     Left lower leg: No edema.  Skin:    General: Skin is warm and dry.  Neurological:     General: No focal  deficit present.     Mental Status: He is alert. Mental status is at baseline.     Cranial Nerves: No cranial nerve deficit.     Sensory: No sensory deficit.     Motor: No weakness.  Psychiatric:        Mood and Affect: Mood normal.        Behavior: Behavior normal.     ED Results / Procedures / Treatments   Labs (all labs ordered are listed, but only abnormal results are displayed) Labs Reviewed  COMPREHENSIVE METABOLIC PANEL - Abnormal; Notable for the following components:      Result Value   Glucose, Bld 181 (*)    Creatinine, Ser 1.40 (*)    GFR, Estimated 49 (*)    All other components within normal limits  CBC WITH DIFFERENTIAL/PLATELET - Abnormal; Notable for the following components:   Platelets 148 (*)    All other components within normal limits  URINALYSIS, ROUTINE W REFLEX MICROSCOPIC - Abnormal; Notable for the following components:   Color, Urine COLORLESS (*)    Protein, ur 30 (*)    All other components within normal limits  CBG MONITORING, ED    EKG EKG Interpretation  Date/Time:  Wednesday Dec 04 2022 10:18:33 EDT Ventricular Rate:  51 PR Interval:    QRS Duration: 97 QT Interval:  454 QTC Calculation: 419 R Axis:   173 Text Interpretation: Atrial fibrillation Right axis deviation Borderline low voltage, extremity leads Probable anteroseptal infarct, old Confirmed by Vonita Moss [phone]) on 12/04/2022 11:19:29 AM  Radiology MR BRAIN WO CONTRAST  Result Date: 12/04/2022 CLINICAL DATA:  difficulty speaking EXAM: MRI HEAD WITHOUT CONTRAST TECHNIQUE: Multiplanar, multiecho pulse sequences of the brain and surrounding structures were obtained without intravenous contrast. COMPARISON:  MRI June 2, 21. FINDINGS: Brain: No acute infarction, acute hemorrhage, hydrocephalus, extra-axial collection or mass lesion. Chronic right temporal lobe encephalomalacia and surrounding gliosis. Cerebral atrophy. Mild for age chronic microvascular ischemic change. Remote  hemorrhages, not progressed. Vascular: Major arterial flow voids are maintained at the skull base. Skull and upper cervical spine: Normal marrow signal. Sinuses/Orbits: Clear sinuses.  No acute orbital findings. Other: No mastoid effusions. IMPRESSION: 1. No acute abnormality. 2. Chronic right temporal lobe infarct. Electronically Signed   By: Feliberto Harts M.D.   On: 12/04/2022 15:48   CT HEAD WO CONTRAST  Result Date: 12/04/2022 CLINICAL DATA:  Altered mental status. EXAM: CT HEAD WITHOUT CONTRAST TECHNIQUE: Contiguous axial images were obtained from the base of the skull through the vertex without intravenous contrast. RADIATION DOSE REDUCTION: This exam was performed according to the  departmental dose-optimization program which includes automated exposure control, adjustment of the mA and/or kV according to patient size and/or use of iterative reconstruction technique. COMPARISON:  Head CT 08/22/2021 FINDINGS: Brain: There is no evidence of an acute infarct, intracranial hemorrhage, mass, midline shift, or extra-axial fluid collection. A moderately large chronic right temporal lobe infarct is again noted. Hypodensities elsewhere in the cerebral white matter bilaterally are slightly more prominent than on the prior study and are nonspecific but compatible with mild chronic small vessel ischemic disease. Vascular: Calcified atherosclerosis at the skull base. No hyperdense vessel. Skull: No acute fracture or suspicious osseous lesion. Sinuses/Orbits: Chronic opacification of a mid to posterior right ethmoid air cell. Clear mastoid air cells. Bilateral cataract extraction. Other: None. IMPRESSION: 1. No evidence of acute intracranial abnormality. 2. Chronic right temporal lobe infarct. 3. Mild chronic small vessel ischemic disease. Electronically Signed   By: Sebastian Ache M.D.   On: 12/04/2022 11:28   DG Chest 2 View  Result Date: 12/04/2022 CLINICAL DATA:  Altered mental status. EXAM: CHEST - 2 VIEW  COMPARISON:  April 28, 2019. FINDINGS: The heart size and mediastinal contours are within normal limits. Both lungs are clear. The visualized skeletal structures are unremarkable. IMPRESSION: No active cardiopulmonary disease. Electronically Signed   By: Lupita Raider M.D.   On: 12/04/2022 11:20    Procedures Procedures    Medications Ordered in ED Medications - No data to display  ED Course/ Medical Decision Making/ A&P                              Medical Decision Making Amount and/or Complexity of Data Reviewed Labs: ordered. Radiology: ordered.   Calvin Burnett is a 89 y.o. male with comorbidities that complicate the patient evaluation including vascular dementia, reported stroke without residual deficits, atrial fibrillation on Eliquis, diabetes, hyperlipidemia, and CKD who presents emergency department with altered mental status.   Initial Ddx:  Delirium, infection, electrolyte abnormality, ICH, stroke, worsening dementia  MDM:  Feel the patient may have worsening dementia or perhaps delirium that caused his episode several days ago.  Will check for COVID infection such as pneumonia and UTI.  Also obtain a CT of the head to rule out ICH but feel that this is less likely.  Will also send labs to evaluate for electrolyte abnormality.  Plan:  Labs Urinalysis CT head Chest x-ray  ED Summary/Re-evaluation:  Patient underwent the above workup without any concerning abnormalities.  Appears to be back to baseline at this time.  Family is concerned about his mental status and his speech so we will obtain an MRI at this time because they are very concerned about a stroke though I feel that this is less likely but could be possible with his symptoms that he was having several days ago.  If this is negative show the patient can likely be discharged home with PCP follow-up.   This patient presents to the ED for concern of complaints listed in HPI, this involves an extensive number  of treatment options, and is a complaint that carries with it a high risk of complications and morbidity. Disposition including potential need for admission considered.   Dispo: Transfer to [facility] ED  Additional history obtained from daughter Records reviewed Outpatient Clinic Notes The following labs were independently interpreted: Chemistry and show CKD I independently reviewed the following imaging with scope of interpretation limited to determining acute life threatening  conditions related to emergency care: CT Head and agree with the radiologist interpretation with the following exceptions: none I personally reviewed and interpreted cardiac monitoring: normal sinus rhythm  I personally reviewed and interpreted the pt's EKG: see above for interpretation  I have reviewed the patients home medications and made adjustments as needed Social Determinants of health:  Elderly         Final Clinical Impression(s) / ED Diagnoses Final diagnoses:  Disorientation  History of dementia    Rx / DC Orders ED Discharge Orders     None         Rondel Baton, MD 12/04/22 [phone]

## 2022-12-04 NOTE — ED Notes (Signed)
Pt sent with facesheet to [facility] for MRI POV with daughter. Report called to charge nurse in ED.

## 2022-12-04 NOTE — ED Triage Notes (Signed)
Pt arrives pov with daughter, reports x 5 days pta, became more disoriented.

## 2022-12-04 NOTE — Discharge Instructions (Signed)
As we discussed, the MRI of your brain did not show any new findings.  You can schedule an appointment with your neurologist for follow-up at your earliest convenience.  I also recommend close primary care follow-up as well.  Return if development of any new or worsening symptoms.

## 2022-12-04 NOTE — ED Notes (Signed)
ED Provider at bedside. 

## 2022-12-04 NOTE — ED Notes (Signed)
PT going POV to [facility] for MRI, Dr Posey Rea is accepting.-ABB(NS)

## 2022-12-06 DIAGNOSIS — Z85828 Personal history of other malignant neoplasm of skin: Secondary | ICD-10-CM | POA: Diagnosis not present

## 2022-12-06 DIAGNOSIS — C44619 Basal cell carcinoma of skin of left upper limb, including shoulder: Secondary | ICD-10-CM | POA: Diagnosis not present

## 2022-12-06 DIAGNOSIS — C44329 Squamous cell carcinoma of skin of other parts of face: Secondary | ICD-10-CM | POA: Diagnosis not present

## 2022-12-06 DIAGNOSIS — C44319 Basal cell carcinoma of skin of other parts of face: Secondary | ICD-10-CM | POA: Diagnosis not present

## 2022-12-22 ENCOUNTER — Other Ambulatory Visit: Payer: Self-pay | Admitting: Family Medicine

## 2023-01-01 DIAGNOSIS — D692 Other nonthrombocytopenic purpura: Secondary | ICD-10-CM | POA: Diagnosis not present

## 2023-01-01 DIAGNOSIS — N183 Chronic kidney disease, stage 3 unspecified: Secondary | ICD-10-CM | POA: Diagnosis not present

## 2023-01-01 DIAGNOSIS — I700 Atherosclerosis of aorta: Secondary | ICD-10-CM | POA: Diagnosis not present

## 2023-01-01 DIAGNOSIS — Z7901 Long term (current) use of anticoagulants: Secondary | ICD-10-CM | POA: Diagnosis not present

## 2023-01-01 DIAGNOSIS — Z1331 Encounter for screening for depression: Secondary | ICD-10-CM | POA: Diagnosis not present

## 2023-01-01 DIAGNOSIS — I480 Paroxysmal atrial fibrillation: Secondary | ICD-10-CM | POA: Diagnosis not present

## 2023-01-01 DIAGNOSIS — I10 Essential (primary) hypertension: Secondary | ICD-10-CM | POA: Diagnosis not present

## 2023-01-01 DIAGNOSIS — I429 Cardiomyopathy, unspecified: Secondary | ICD-10-CM | POA: Diagnosis not present

## 2023-01-01 DIAGNOSIS — E1122 Type 2 diabetes mellitus with diabetic chronic kidney disease: Secondary | ICD-10-CM | POA: Diagnosis not present

## 2023-01-01 DIAGNOSIS — Z87891 Personal history of nicotine dependence: Secondary | ICD-10-CM | POA: Diagnosis not present

## 2023-01-01 DIAGNOSIS — F0150 Vascular dementia without behavioral disturbance: Secondary | ICD-10-CM | POA: Diagnosis not present

## 2023-01-01 DIAGNOSIS — E1151 Type 2 diabetes mellitus with diabetic peripheral angiopathy without gangrene: Secondary | ICD-10-CM | POA: Diagnosis not present

## 2023-01-06 ENCOUNTER — Other Ambulatory Visit: Payer: Self-pay | Admitting: Family Medicine

## 2023-01-22 DIAGNOSIS — C44619 Basal cell carcinoma of skin of left upper limb, including shoulder: Secondary | ICD-10-CM | POA: Diagnosis not present

## 2023-01-22 DIAGNOSIS — Z85828 Personal history of other malignant neoplasm of skin: Secondary | ICD-10-CM | POA: Diagnosis not present

## 2023-01-29 DIAGNOSIS — Z85828 Personal history of other malignant neoplasm of skin: Secondary | ICD-10-CM | POA: Diagnosis not present

## 2023-01-29 DIAGNOSIS — C44319 Basal cell carcinoma of skin of other parts of face: Secondary | ICD-10-CM | POA: Diagnosis not present

## 2023-02-06 ENCOUNTER — Encounter: Payer: Self-pay | Admitting: Physician Assistant

## 2023-02-06 ENCOUNTER — Ambulatory Visit: Payer: PPO | Admitting: Physician Assistant

## 2023-02-06 VITALS — BP 148/79 | HR 57 | Resp 18 | Wt 156.0 lb

## 2023-02-06 DIAGNOSIS — F01A0 Vascular dementia, mild, without behavioral disturbance, psychotic disturbance, mood disturbance, and anxiety: Secondary | ICD-10-CM

## 2023-02-06 NOTE — Progress Notes (Signed)
Assessment/Plan:   Memory Impairment, likely of vascular etiology  Calvin Burnett is a very pleasant 89 y.o. RH malewith a history ofAfib on Eliquis, DM2, hyperlipidemia, HTN, CKD3, bradycardia, B12 deficiency and a history of memory impairment likely of vascular etiology seen today in follow up for memory loss. Patient is currently on memantine XR 28 mg daily TTE, tolerating well.Marland Kitchen MMSE same as last visit from last year, at 20/30 . He continues to perform ADLs including driving.   Follow up in 6  months. Continue memantine XR 28 mg daily  (history of bradycardia when on Aricept), side effects discussed. Recommend good control of her cardiovascular risk factors Continue to control mood as per PCP    Subjective:    This patient is accompanied in the office by his daughter who supplements the history.  Previous records as well as any outside records available were reviewed prior to todays visit. Patient was last seen on 08/08/2022, last MMSE was 2023 20/30   Any changes in memory since last visit? "STM is worse".  He enjoys watching game shows as well as news.  He has a routine and he is "sticks to it ".  He is able to do all his ADLs without any problems.  He does not like to do any crossword puzzles or word finding.  Long-term memory is unremarkable. repeats oneself?  Endorsed Disoriented when walking into a room?  Patient denies    Leaving objects in unusual places?  May misplace things but not in unusual places   Wandering behavior?  denies   Any personality changes since last visit?  He may get upset at times.  Any worsening depression?:  denies   Hallucinations or paranoia?  denies   Seizures? denies    Any sleep changes?  Denies vivid dreams, REM behavior or sleepwalking   Sleep apnea?   denies   Any hygiene concerns? denies  Independent of bathing and dressing?  Endorsed  Does the patient needs help with medications?  Patient  is in charge   Who is in charge of the  finances? Patient  is in charge , daughter monitors   Any changes in appetite?  denies.  He likes to drive to [employer], the staff already noticing there same meal every day for breakfast.  He then goes to [place] the pharmacy  Patient have trouble swallowing? denies   Does the patient cook? No Any headaches?   denies   Chronic back pain  denies   Ambulates with difficulty? denies    Recent falls or head injuries? denies     Unilateral weakness, numbness or tingling? denies   Any tremors?  denies   Any anosmia?  Patient denies   Any incontinence of urine?  Denies  Any bowel dysfunction?     denies      Patient Daughter has moved from [place] and now lives with him, with great family support.  Does the patient drive?  Only short distances, denies any issues.    History on Initial Assessment 05/25/2020: This is a pleasant 89 year old right-handed man with a history of hypertension, hyperlipidemia, diabetes, atrial fibrillation on Eliquis, stroke, presenting for evaluation of vascular dementia. He is accompanied by his daughter [name] who helps supplement the history today. He feels his memory is pretty good. [name] started noticing short-term memory changes around 2-3 years ago, but feels that he is overall doing well independently. He lives alone. He has a good family support system with several  family members locally checking on his regularly. His wife passed away 5 years ago and he was under a lot of stress. There have been several deaths in the family and he has been through a lot. He denies getting lost driving, [name] denies any driving concerns. He manages his own medications and finances, he and [name] deny any issues with these. He is part of the [employer] at [employer] and manages things well.  He denies misplacing things frequently, [name] reports he rarely misplaces things but gets really hard on himself when he loses things. He denies any word-finding difficulties. He does  not cook much and denies leaving the stove on. He is independent with dressing and bathing, [name] denies any hygiene concerns. His brother is his POA. No family history of dementia. He denies any significant head injuries or alcohol use. Notes reviewed, he was started on Namenda by Dr. Lovell Sheehan in 2015. MMSE 26/30 in 2017, 21/30 in 2019.    He denies any headaches, dizziness, diplopia, dysarthria, dysphagia, neck pain, focal numbness/tingling/weakness, bowel/bladder dysfunction. No anosmia, tremors, no falls.  He walks a mile a day. He has some back pain today, right leg feels different. Sleep is good. No personality changes, paranoia or hallucinations. He denies any known prior history of stroke. [name] reports that he passed out while at [place], and this is when he had a stroke. EPIC notes reviewed, he had a syncopal episode in 2016 due to bradycardia. CT head at that time did not show any evidence of stroke. There was diffuse atrophy and chronic microvascular disease. He had an MRI brain with and without contrast in 11/2017 for memory loss, confusion. There was note of a remote right temporal lobe infarct with encephalomalacia that was not seen in 2016 imaging.  I personally reviewed MRI brain without contrast done 12/2019 which did not show any acute changes. There was a large remote right temporal infarct, inferior division MCA territory, diffuse atrophy and mild chronic microvascular disease. There were remote hemorrhages along the right temporal occipital convexity without generalized chronic lobar hemorrhage, possible mild gliosis in the parasagittal or parietal lobes adjacent to the inferior falx.   PREVIOUS MEDICATIONS: Donepezil (bradycardia)  CURRENT MEDICATIONS:  Outpatient Encounter Medications as of 02/06/2023  Medication Sig   apixaban (ELIQUIS) 5 MG TABS tablet Take 1 tablet (5 mg total) by mouth 2 (two) times daily.   blood glucose meter kit and supplies KIT Dispense based on patient and  insurance preference. Use up to four times daily as directed. Dx E11.9   latanoprost (XALATAN) 0.005 % ophthalmic solution Place 1 drop into both eyes at bedtime.    memantine (NAMENDA XR) 28 MG CP24 24 hr capsule TAKE 1 CAPSULE BY MOUTH DAILY.   metFORMIN (GLUCOPHAGE-XR) 500 MG 24 hr tablet TAKE 1 TABLET BY MOUTH EVERY DAY WITH BREAKFAST   metoprolol succinate (TOPROL-XL) 25 MG 24 hr tablet TAKE 1/2 TABLET BY MOUTH EVERY DAY   rosuvastatin (CRESTOR) 20 MG tablet TAKE 1 TABLET (20 MG TOTAL) BY MOUTH ONCE A WEEK.   cyanocobalamin 1000 MCG tablet Take 1,000 mcg by mouth 2 (two) times daily.   omeprazole (PRILOSEC) 20 MG capsule Take 1 capsule (20 mg total) by mouth daily. (Patient not taking: Reported on 12/04/2022)   No facility-administered encounter medications on file as of 02/06/2023.       02/06/2023   12:00 PM 02/14/2022   12:00 PM 02/08/2021    8:00 AM  MMSE - Mini Mental State Exam  Orientation to time 1 1 4   Orientation to Place 3 5 4   Registration 3 3 3   Attention/ Calculation 5 3 2   Recall 0 0 0  Language- name 2 objects 2 2 2   Language- repeat 1 1 1   Language- follow 3 step command 3 3 3   Language- read & follow direction 1 1 1   Write a sentence 1 1 1   Copy design 0 0 0  Total score 20 20 21       05/25/2020   10:00 AM  Montreal Cognitive Assessment   Visuospatial/ Executive (0/5) 3  Naming (0/3) 2  Attention: Read list of digits (0/2) 2  Attention: Read list of letters (0/1) 1  Attention: Serial 7 subtraction starting at 100 (0/3) 2  Language: Repeat phrase (0/2) 2  Language : Fluency (0/1) 1  Abstraction (0/2) 2  Delayed Recall (0/5) 0  Orientation (0/6) 4  Total 19  Adjusted Score (based on education) 20    Objective:     PHYSICAL EXAMINATION:    VITALS:   Vitals:   02/06/23 1116  BP: (!) 148/79  Pulse: (!) 57  Resp: 18  SpO2: 97%  Weight: 156 lb (70.8 kg)    GEN:  The patient appears stated age and is in NAD. HEENT:  Normocephalic, atraumatic.    Neurological examination:  General: NAD, well-groomed, appears stated age. Orientation: The patient is alert. Oriented to person, place and not to date Cranial nerves: There is good facial symmetry.The speech is fluent and clear. No aphasia or dysarthria. Fund of knowledge is appropriate. Recent and remote memory are impaired. Attention and concentration are reduced.  Able to name objects and repeat phrases.  Hearing is intact to conversational tone.   Sensation: Sensation is intact to light touch throughout Motor: Strength is at least antigravity x4. DTR's 2/4 in UE/LE     Movement examination: Tone: There is normal tone in the UE/LE Abnormal movements:  no tremor.  No myoclonus.  No asterixis.   Coordination:  There is no decremation with RAM's. Normal finger to nose  Gait and Station: The patient has no difficulty arising out of a deep-seated chair without the use of the hands. The patient's stride length is good.  Gait is cautious and narrow.    Thank you for allowing [place] the opportunity to participate in the care of this nice patient. Please do not hesitate to contact us for any questions or concerns.   Total time spent on today's visit was 22 minutes dedicated to this patient today, preparing to see patient, examining the patient, ordering tests and/or medications and counseling the patient, documenting clinical information in the EHR or other health record, independently interpreting results and communicating results to the patient/family, discussing treatment and goals, answering patient's questions and coordinating care.  Cc:  Shelva Majestic, MD  Marlowe Kays 02/06/2023 12:44 PM

## 2023-02-06 NOTE — Patient Instructions (Addendum)
It was a pleasure to see you today at our office.   Recommendations:  Follow up in 6  months Continue Namenda 28 mg XR Continue B12 supplements Monitor your pulse  Whom to call:  Memory  decline, memory medications: Call our office [phone]   For psychiatric meds, mood meds: Please have your primary care physician manage these medications.      For assessment of decision of mental capacity and competency:  Call Dr. Erick Blinks, geriatric psychiatrist at [phone]  For guidance in geriatric dementia issues please call [facility] [phone]  For guidance regarding [facility] and if placement were needed at the facility, contact Sidney Ace, Social Worker tel: [phone]  If you have any severe symptoms of a stroke, or other severe issues such as confusion,severe chills or fever, etc call 911 or go to the ER as you may need to be evaluated further     RECOMMENDATIONS FOR ALL PATIENTS WITH MEMORY PROBLEMS: 1. Continue to exercise (Recommend 30 minutes of walking everyday, or 3 hours every week) 2. Increase social interactions - continue going to [employer] and enjoy social gatherings with friends and family 3. Eat healthy, avoid fried foods and eat more fruits and vegetables 4. Maintain adequate blood pressure, blood sugar, and blood cholesterol level. Reducing the risk of stroke and cardiovascular disease also helps promoting better memory. 5. Avoid stressful situations. Live a simple life and avoid aggravations. Organize your time and prepare for the next day in anticipation. 6. Sleep well, avoid any interruptions of sleep and avoid any distractions in the bedroom that may interfere with adequate sleep quality 7. Avoid sugar, avoid sweets as there is a strong link between excessive sugar intake, diabetes, and cognitive impairment We discussed the Mediterranean diet, which has been shown to help patients reduce the risk of progressive  memory disorders and reduces cardiovascular risk. This includes eating fish, eat fruits and green leafy vegetables, nuts like almonds and hazelnuts, walnuts, and also use olive oil. Avoid fast foods and fried foods as much as possible. Avoid sweets and sugar as sugar use has been linked to worsening of memory function.  There is always a concern of gradual progression of memory problems. If this is the case, then we may need to adjust level of care according to patient needs. Support, both to the patient and caregiver, should then be put into place.    FALL PRECAUTIONS: Be cautious when walking. Scan the area for obstacles that may increase the risk of trips and falls. When getting up in the mornings, sit up at the edge of the bed for a few minutes before getting out of bed. Consider elevating the bed at the head end to avoid drop of blood pressure when getting up. Walk always in a well-lit room (use night lights in the walls). Avoid area rugs or power cords from appliances in the middle of the walkways. Use a walker or a cane if necessary and consider physical therapy for balance exercise. Get your eyesight checked regularly.  FINANCIAL OVERSIGHT: Supervision, especially oversight when making financial decisions or transactions is also recommended.  HOME SAFETY: Consider the safety of the kitchen when operating appliances like stoves, microwave oven, and blender. Consider having supervision and share cooking responsibilities until no longer able to participate in those. Accidents with firearms and other hazards in the house should be identified and addressed as well.   ABILITY TO BE LEFT ALONE: If patient is unable to contact 911  operator, consider using [employer], or when the need is there, arrange for someone to stay with patients. Smoking is a fire hazard, consider supervision or cessation. Risk of wandering should be assessed by caregiver and if detected at any point, supervision and safe proof  recommendations should be instituted.  MEDICATION SUPERVISION: Inability to self-administer medication needs to be constantly addressed. Implement a mechanism to ensure safe administration of the medications.   DRIVING: Regarding driving, in patients with progressive memory problems, driving will be impaired. We advise to have someone else do the driving if trouble finding directions or if minor accidents are reported. Independent driving assessment is available to determine safety of driving.   If you are interested in the driving assessment, you can contact the following:  [employer] in [place] [phone]  [facility] [phone]  [facility] [phone]  [facility] [phone] or [phone]

## 2023-02-24 DIAGNOSIS — E1122 Type 2 diabetes mellitus with diabetic chronic kidney disease: Secondary | ICD-10-CM | POA: Diagnosis not present

## 2023-02-24 DIAGNOSIS — I429 Cardiomyopathy, unspecified: Secondary | ICD-10-CM | POA: Diagnosis not present

## 2023-02-24 DIAGNOSIS — N1831 Chronic kidney disease, stage 3a: Secondary | ICD-10-CM | POA: Diagnosis not present

## 2023-02-24 DIAGNOSIS — F325 Major depressive disorder, single episode, in full remission: Secondary | ICD-10-CM | POA: Diagnosis not present

## 2023-02-24 DIAGNOSIS — F01B0 Vascular dementia, moderate, without behavioral disturbance, psychotic disturbance, mood disturbance, and anxiety: Secondary | ICD-10-CM | POA: Diagnosis not present

## 2023-02-24 DIAGNOSIS — Z7985 Long-term (current) use of injectable non-insulin antidiabetic drugs: Secondary | ICD-10-CM | POA: Diagnosis not present

## 2023-03-06 ENCOUNTER — Other Ambulatory Visit: Payer: Self-pay | Admitting: Family Medicine

## 2023-03-23 ENCOUNTER — Emergency Department (HOSPITAL_BASED_OUTPATIENT_CLINIC_OR_DEPARTMENT_OTHER)
Admission: EM | Admit: 2023-03-23 | Discharge: 2023-03-23 | Disposition: A | Discharge status: Home / Self Care | Payer: PPO | Attending: Emergency Medicine | Admitting: Emergency Medicine

## 2023-03-23 ENCOUNTER — Encounter (HOSPITAL_BASED_OUTPATIENT_CLINIC_OR_DEPARTMENT_OTHER): Payer: Self-pay | Admitting: *Deleted

## 2023-03-23 ENCOUNTER — Other Ambulatory Visit: Payer: Self-pay

## 2023-03-23 DIAGNOSIS — R059 Cough, unspecified: Secondary | ICD-10-CM | POA: Diagnosis present

## 2023-03-23 DIAGNOSIS — Z20822 Contact with and (suspected) exposure to covid-19: Secondary | ICD-10-CM | POA: Insufficient documentation

## 2023-03-23 DIAGNOSIS — B349 Viral infection, unspecified: Secondary | ICD-10-CM | POA: Diagnosis not present

## 2023-03-23 DIAGNOSIS — Z7901 Long term (current) use of anticoagulants: Secondary | ICD-10-CM | POA: Insufficient documentation

## 2023-03-23 LAB — SARS CORONAVIRUS 2 BY RT PCR: SARS Coronavirus 2 by RT PCR: POSITIVE — AB

## 2023-03-23 MED ORDER — ONDANSETRON 4 MG PO TBDP: ORAL_TABLET | ORAL | 0 refills | Status: DC

## 2023-03-23 MED ORDER — BENZONATATE 100 MG PO CAPS: 100.0000 mg | ORAL_CAPSULE | Freq: Three times a day (TID) | ORAL | 0 refills | Status: DC

## 2023-03-23 MED ORDER — LIDOCAINE VISCOUS HCL 2 % MT SOLN
15.0000 mL | Freq: Once | OROMUCOSAL | Status: AC
  Administered 2023-03-23: 15 mL via OROMUCOSAL
  Filled 2023-03-23: qty 15

## 2023-03-23 MED ORDER — ACETAMINOPHEN 500 MG PO TABS
1000.0000 mg | ORAL_TABLET | Freq: Once | ORAL | Status: AC
  Administered 2023-03-23: 1000 mg via ORAL
  Filled 2023-03-23: qty 2

## 2023-03-23 NOTE — ED Triage Notes (Signed)
Pt c/o cough and sore throat that started a few days ago. Fevers unknown. Denies sob. States he has been able to eat and drink. C/o of feeling tired. 02 sats 96%. Several members of his family have been sick as well.

## 2023-03-23 NOTE — ED Provider Notes (Signed)
[place] EMERGENCY DEPARTMENT AT [facility] Provider Note   CSN: [id number] Arrival date & time: 03/23/23  2155     History  Chief Complaint  Patient presents with   Cough    Calvin Burnett is a 89 y.o. male.  89 yo M with a chief complaints of cough and sore throat.  Going on for a few days now.  There have been multiple family members that were sick.  And thought to be a viral syndrome.  The family was concerned that this may be COVID and so brought him here for evaluation.  He tells me that he has been tired and that his throat hurts.  Has a history of dementia and has had a progressive decline over the past year or so.   Cough      Home Medications Prior to Admission medications   Medication Sig Start Date End Date Taking? Authorizing Provider  benzonatate (TESSALON) 100 MG capsule Take 1 capsule (100 mg total) by mouth every 8 (eight) hours. 03/23/23  Yes Melene Plan, DO  ondansetron (ZOFRAN-ODT) 4 MG disintegrating tablet 4mg  ODT q4 hours prn nausea/vomit 03/23/23  Yes Melene Plan, DO  apixaban (ELIQUIS) 5 MG TABS tablet Take 1 tablet (5 mg total) by mouth 2 (two) times daily. 09/10/22   Camnitz, Andree Coss, MD  blood glucose meter kit and supplies KIT Dispense based on patient and insurance preference. Use up to four times daily as directed. Dx E11.9 03/10/20   Shelva Majestic, MD  cyanocobalamin 1000 MCG tablet Take 1,000 mcg by mouth 2 (two) times daily.    [provider]  latanoprost (XALATAN) 0.005 % ophthalmic solution Place 1 drop into both eyes at bedtime.  08/05/15   [provider]  memantine (NAMENDA XR) 28 MG CP24 24 hr capsule TAKE 1 CAPSULE BY MOUTH DAILY. 08/19/22   Marcos Eke, PA-C  metFORMIN (GLUCOPHAGE-XR) 500 MG 24 hr tablet TAKE 1 TABLET BY MOUTH EVERY DAY WITH BREAKFAST 01/07/23   Shelva Majestic, MD  metoprolol succinate (TOPROL-XL) 25 MG 24 hr tablet TAKE 1/2 TABLET BY MOUTH EVERY DAY 12/23/22   Shelva Majestic, MD   omeprazole (PRILOSEC) 20 MG capsule Take 1 capsule (20 mg total) by mouth daily. Patient not taking: Reported on 12/04/2022 09/19/22   Mardene Sayer, MD  rosuvastatin (CRESTOR) 20 MG tablet TAKE 1 TABLET (20 MG TOTAL) BY MOUTH WEEKLY 03/07/23   Shelva Majestic, MD      Allergies    Kcentra [prothrombin complex conc human]    Review of Systems   Review of Systems  Respiratory:  Positive for cough.     Physical Exam Updated Vital Signs BP (!) 163/101 (BP Location: Right Arm)   Pulse 87   Temp (!) 101.1 F (38.4 C) (Oral)   Resp 19   SpO2 97%  Physical Exam Vitals and nursing note reviewed.  Constitutional:      Appearance: He is well-developed.  HENT:     Head: Normocephalic.     Comments: Swollen turbinates, posterior nasal drip, no appreciable tonsillar swelling.  Uvula is midline.  Able to rotate his head without pain.  Tolerating secretions without difficulty.  Eyes:     Pupils: Pupils are equal, round, and reactive to light.  Neck:     Vascular: No JVD.  Cardiovascular:     Rate and Rhythm: Normal rate and regular rhythm.     Heart sounds: No murmur heard.    No  friction rub. No gallop.  Pulmonary:     Effort: No respiratory distress.     Breath sounds: No wheezing.  Abdominal:     General: There is no distension.     Tenderness: There is no abdominal tenderness. There is no guarding or rebound.  Musculoskeletal:        General: Normal range of motion.     Cervical back: Normal range of motion and neck supple.  Skin:    Coloration: Skin is not pale.     Findings: No rash.  Neurological:     Mental Status: He is alert and oriented to person, place, and time.  Psychiatric:        Behavior: Behavior normal.     ED Results / Procedures / Treatments   Labs (all labs ordered are listed, but only abnormal results are displayed) Labs Reviewed  SARS CORONAVIRUS 2 BY RT PCR    EKG None  Radiology No results found.  Procedures Procedures     Medications Ordered in ED Medications  acetaminophen (TYLENOL) tablet 1,000 mg (has no administration in time range)  lidocaine (XYLOCAINE) 2 % viscous mouth solution 15 mL (has no administration in time range)    ED Course/ Medical Decision Making/ A&P                                 Medical Decision Making Risk OTC drugs. Prescription drug management.   89 yo M with a chief complaints of congestion sore throat and feeling fatigued.  There have been a couple family members that have been sick with similar symptoms.  Family is concerned that maybe he has COVID.  He is febrile here.  No obvious finding in the posterior oropharynx on my exam with sore throat being his most prominent complaint I doubt he has a Ludwig's angina or retropharyngeal abscess.  By history much more likely viral syndrome.  Will treat supportively.  PCP follow-up.  10:49 PM:  I have discussed the diagnosis/risks/treatment options with the patient and family.  Evaluation and diagnostic testing in the emergency department does not suggest an emergent condition requiring admission or immediate intervention beyond what has been performed at this time.  They will follow up with PCP. We also discussed returning to the ED immediately if new or worsening sx occur. We discussed the sx which are most concerning (e.g., sudden worsening pain, fever, inability to tolerate by mouth) that necessitate immediate return. Medications administered to the patient during their visit and any new prescriptions provided to the patient are listed below.  Medications given during this visit Medications  acetaminophen (TYLENOL) tablet 1,000 mg (has no administration in time range)  lidocaine (XYLOCAINE) 2 % viscous mouth solution 15 mL (has no administration in time range)     The patient appears reasonably screen and/or stabilized for discharge and I doubt any other medical condition or other [facility] requiring further screening, evaluation,  or treatment in the ED at this time prior to discharge.          Final Clinical Impression(s) / ED Diagnoses Final diagnoses:  Acute viral syndrome    Rx / DC Orders ED Discharge Orders          Ordered    benzonatate (TESSALON) 100 MG capsule  Every 8 hours        03/23/23 2245    ondansetron (ZOFRAN-ODT) 4 MG disintegrating tablet  03/23/23 2245              Melene Plan, DO 03/23/23 2249

## 2023-03-23 NOTE — Discharge Instructions (Addendum)
Take tylenol 2 pills 4 times a day and motrin 4 pills 3 times a day.  Drink plenty of fluids.  Return for worsening shortness of breath, headache, confusion. Follow up with your family doctor.   I think he most likely has a viral syndrome that he caught from you.

## 2023-04-18 ENCOUNTER — Telehealth: Payer: Self-pay

## 2023-04-18 NOTE — Telephone Encounter (Signed)
Transition Care Management Unsuccessful Follow-up Telephone Call  Date of discharge and from where:  [facility] 9/8  Attempts:  1st Attempt  Reason for unsuccessful TCM follow-up call:  No answer/busy   Lenard Forth [place]  [facility], [facility] Guide, Phone: [phone] Website: [email or web address]

## 2023-04-21 ENCOUNTER — Telehealth: Payer: Self-pay

## 2023-04-21 NOTE — Telephone Encounter (Signed)
Transition Care Management Unsuccessful Follow-up Telephone Call  Date of discharge and from where:  [facility] 9/8  Attempts:  2nd Attempt  Reason for unsuccessful TCM follow-up call:  No answer/busy   Calvin Burnett [place]  [facility], [facility] Guide, Phone: [phone] Website: [email or web address]

## 2023-05-06 ENCOUNTER — Other Ambulatory Visit: Payer: Self-pay | Admitting: Cardiology

## 2023-05-06 DIAGNOSIS — I480 Paroxysmal atrial fibrillation: Secondary | ICD-10-CM

## 2023-05-06 NOTE — Telephone Encounter (Signed)
Eliquis 5mg  refill request received. Patient is 89 years old, weight-70.8kg, Crea-1.40 on 12/04/22, Diagnosis-Afib, and last seen by Dr. Elberta Fortis on 09/10/22. Dose is appropriate based on dosing criteria. Will send in refill to requested pharmacy.

## 2023-05-26 ENCOUNTER — Other Ambulatory Visit: Payer: Self-pay | Admitting: Family Medicine

## 2023-06-10 ENCOUNTER — Other Ambulatory Visit: Payer: Self-pay | Admitting: Family Medicine

## 2023-06-10 ENCOUNTER — Other Ambulatory Visit: Payer: Self-pay | Admitting: Physician Assistant

## 2023-06-11 ENCOUNTER — Encounter: Payer: Self-pay | Admitting: Family Medicine

## 2023-06-11 ENCOUNTER — Ambulatory Visit: Payer: PPO | Admitting: Family Medicine

## 2023-06-11 VITALS — BP 154/78 | HR 92 | Temp 97.6°F | Ht 70.0 in | Wt 159.2 lb

## 2023-06-11 DIAGNOSIS — I350 Nonrheumatic aortic (valve) stenosis: Secondary | ICD-10-CM

## 2023-06-11 DIAGNOSIS — Z8546 Personal history of malignant neoplasm of prostate: Secondary | ICD-10-CM

## 2023-06-11 DIAGNOSIS — I10 Essential (primary) hypertension: Secondary | ICD-10-CM

## 2023-06-11 DIAGNOSIS — Z0001 Encounter for general adult medical examination with abnormal findings: Secondary | ICD-10-CM

## 2023-06-11 DIAGNOSIS — E1122 Type 2 diabetes mellitus with diabetic chronic kidney disease: Secondary | ICD-10-CM | POA: Diagnosis not present

## 2023-06-11 DIAGNOSIS — I480 Paroxysmal atrial fibrillation: Secondary | ICD-10-CM

## 2023-06-11 DIAGNOSIS — E1169 Type 2 diabetes mellitus with other specified complication: Secondary | ICD-10-CM | POA: Diagnosis not present

## 2023-06-11 DIAGNOSIS — E785 Hyperlipidemia, unspecified: Secondary | ICD-10-CM | POA: Diagnosis not present

## 2023-06-11 DIAGNOSIS — E538 Deficiency of other specified B group vitamins: Secondary | ICD-10-CM

## 2023-06-11 DIAGNOSIS — Z7984 Long term (current) use of oral hypoglycemic drugs: Secondary | ICD-10-CM | POA: Diagnosis not present

## 2023-06-11 LAB — COMPREHENSIVE METABOLIC PANEL
ALT: 16 U/L (ref 0–53)
AST: 21 U/L (ref 0–37)
Albumin: 3.9 g/dL (ref 3.5–5.2)
Alkaline Phosphatase: 104 U/L (ref 39–117)
BUN: 21 mg/dL (ref 6–23)
CO2: 26 meq/L (ref 19–32)
Calcium: 8.6 mg/dL (ref 8.4–10.5)
Chloride: 105 meq/L (ref 96–112)
Creatinine, Ser: 1.38 mg/dL (ref 0.40–1.50)
GFR: 45.97 mL/min — ABNORMAL LOW (ref >60.00)
Glucose, Bld: 109 mg/dL — ABNORMAL HIGH (ref 70–99)
Potassium: 4 meq/L (ref 3.5–5.1)
Sodium: 139 meq/L (ref 135–145)
Total Bilirubin: 0.7 mg/dL (ref 0.2–1.2)
Total Protein: 7 g/dL (ref 6.0–8.3)

## 2023-06-11 LAB — CBC WITH DIFFERENTIAL/PLATELET
Basophils Absolute: 0.1 10*3/uL (ref 0.0–0.1)
Basophils Relative: 1.3 % (ref 0.0–3.0)
Eosinophils Absolute: 0.1 10*3/uL (ref 0.0–0.7)
Eosinophils Relative: 2.4 % (ref 0.0–5.0)
HCT: 42.4 % (ref 39.0–52.0)
Hemoglobin: 14.4 g/dL (ref 13.0–17.0)
Lymphocytes Relative: 29.6 % (ref 12.0–46.0)
Lymphs Abs: 1.6 10*3/uL (ref 0.7–4.0)
MCHC: 33.9 g/dL (ref 30.0–36.0)
MCV: 96.4 fL (ref 78.0–100.0)
Monocytes Absolute: 0.4 10*3/uL (ref 0.1–1.0)
Monocytes Relative: 7.1 % (ref 3.0–12.0)
Neutro Abs: 3.3 10*3/uL (ref 1.4–7.7)
Neutrophils Relative %: 59.6 % (ref 43.0–77.0)
Platelets: 181 10*3/uL (ref 150.0–400.0)
RBC: 4.4 Mil/uL (ref 4.22–5.81)
RDW: 14.4 % (ref 11.5–15.5)
WBC: 5.6 10*3/uL (ref 4.0–10.5)

## 2023-06-11 LAB — LIPID PANEL
Cholesterol: 137 mg/dL (ref 0–200)
HDL: 42.4 mg/dL (ref >39.00)
LDL Cholesterol: 78 mg/dL (ref 0–99)
NonHDL: 94.6
Total CHOL/HDL Ratio: 3
Triglycerides: 84 mg/dL (ref 0.0–149.0)
VLDL: 16.8 mg/dL (ref 0.0–40.0)

## 2023-06-11 LAB — HEMOGLOBIN A1C: Hgb A1c MFr Bld: 6.2 % (ref 4.6–6.5)

## 2023-06-11 LAB — PSA, MEDICARE: PSA: 0.01 ng/mL — ABNORMAL LOW (ref 0.10–4.00)

## 2023-06-11 MED ORDER — AMLODIPINE BESYLATE 2.5 MG PO TABS: 2.5000 mg | ORAL_TABLET | Freq: Every day | ORAL | 3 refills | Status: DC
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
## 2023-06-11 NOTE — Assessment & Plan Note (Signed)
S: Medication: None   -Metoprolol 12.5 mg extended release for A-fib but stopped due to bradycardia -Also stopped amlodipine 2.5mg  in the past - stopped hctz 12.5mg  due to loewr BP and dizziness.   At last visit blood pressure was high normal at 138 but since that time appears to have been trending up BP Readings from Last 3 Encounters:  06/11/23 (!) 154/78  03/23/23 (!) 163/101  02/06/23 (!) 148/79  A/P: hypertension doing well without medications in in the past but now trending up/poorly controlled-start amlodipine 2.5 mg.  Has had some swelling in his feet in the past and will need to monitor.  1 to 81-month follow-up recommended

## 2023-06-11 NOTE — Patient Instructions (Signed)
Get diabetic eye exam scheduled.   Consider RSV at pharmacy  Blood pressure trending up- start amlodipine 2.5 mg and see me back in 1-2 months- let me know if any side effects- most common side effects is mild ankle swelling   We have placed a referral for you today to echocardiogram. In some cases you will see # listed below- you can call this if you have not heard within a week. If you do not see # listed- you should receive a mychart message or phone call within a week with the # to call directly- call that as soon as you get it. If you are having issues getting scheduled reach out to [place] again.    Please stop by lab before you go If you have mychart- we will send your results within 3 business days of [place] receiving them.  If you do not have mychart- we will call you about results within 5 business days of [place] receiving them.  *please also note that you will see labs on mychart as soon as they post. I will later go in and write notes on them- will say "notes from Dr. Durene Cal"   Recommended follow up: Return in about 1 month (around 07/11/2023) for followup or sooner if needed.Schedule b4 you leave.

## 2023-06-11 NOTE — Progress Notes (Signed)
Phone [phone] In person visit   Subjective:   Calvin Burnett is a 89 y.o. year old very pleasant male patient who presents for/with See problem oriented charting Chief Complaint  Patient presents with   Annual Exam    Not fasting. (Denies issues/ROS)    Diabetes   Hypertension    Past Medical History-  Patient Active Problem List   Diagnosis Date Noted   Intra-abdominal abscess ([name]) 04/24/2019    Priority: High   B12 deficiency 12/04/2017    Priority: High   pT1pN0 colon cancer s/p robotic right colectomy 11/26/2017 10/02/2017    Priority: High   AF (paroxysmal atrial fibrillation) ([name]) 04/09/2017    Priority: High   Night sweats 09/16/2016    Priority: High   Major depressive disorder with single episode, in full remission ([name]) 07/29/2016    Priority: High   Rectal pain 10/14/2014    Priority: High   Type 2 diabetes mellitus ([name]) 07/31/2010    Priority: High   Vascular dementia ([name]) 07/27/2008    Priority: High   Mild aortic stenosis 04/05/2021    Priority: Medium    BPPV (benign paroxysmal positional vertigo) 12/17/2019    Priority: Medium    History of adenomatous polyp of colon 10/27/2014    Priority: Medium    Syncope 09/27/2014    Priority: Medium    CKD (chronic kidney disease), stage III ([name]) 06/01/2014    Priority: Medium    Hyperlipidemia associated with type 2 diabetes mellitus ([name]) 03/01/2014    Priority: Medium    Basal cell carcinoma 05/03/2009    Priority: Medium    Essential hypertension 01/30/2007    Priority: Medium    Gout 10/28/2019    Priority: Low   Aortic atherosclerosis ([name]) 01/29/2019    Priority: Low   Stricture of sigmoid s/p robotic sigmoidectomy 11/26/2017 11/26/2017    Priority: Low   Chronic anticoagulation 11/26/2017    Priority: Low   Right inguinal hernia 10/02/2017    Priority: Low   Bowel habit changes 10/14/2014    Priority: Low   Bradycardia 09/27/2014    Priority: Low   LEG CRAMPS, NOCTURNAL  04/30/2010    Priority: Low   Cervical spondylosis without myelopathy 12/11/2007    Priority: Low   GERD (gastroesophageal reflux disease) 11/05/2007    Priority: Low    Medications- reviewed and updated Current Outpatient Medications  Medication Sig Dispense Refill   amLODipine (NORVASC) 2.5 MG tablet Take 1 tablet (2.5 mg total) by mouth daily. 90 tablet 3   blood glucose meter kit and supplies KIT Dispense based on patient and insurance preference. Use up to four times daily as directed. Dx E11.9 1 each 0   cyanocobalamin 1000 MCG tablet Take 1,000 mcg by mouth 2 (two) times daily.     ELIQUIS 5 MG TABS tablet TAKE 1 TABLET BY MOUTH TWICE A DAY 60 tablet 6   latanoprost (XALATAN) 0.005 % ophthalmic solution Place 1 drop into both eyes at bedtime.   12   memantine (NAMENDA XR) 28 MG CP24 24 hr capsule TAKE 1 CAPSULE BY MOUTH EVERY DAY 90 capsule 1   metFORMIN (GLUCOPHAGE-XR) 500 MG 24 hr tablet TAKE 1 TABLET BY MOUTH EVERY DAY WITH BREAKFAST 90 tablet 2   metoprolol succinate (TOPROL-XL) 25 MG 24 hr tablet TAKE 1/2 TABLET BY MOUTH DAILY 45 tablet 1   omeprazole (PRILOSEC) 20 MG capsule Take 1 capsule (20 mg total) by mouth daily. 14 capsule 0  rosuvastatin (CRESTOR) 20 MG tablet TAKE 1 TABLET (20 MG TOTAL) BY MOUTH WEEKLY 13 tablet 3   No current facility-administered medications for this visit.     Objective:  BP (!) 154/78   Pulse 92   Temp 97.6 F (36.4 C)   Ht 5\' 10"  (1.778 m)   Wt 159 lb 3.2 oz (72.2 kg)   SpO2 97%   BMI 22.84 kg/m     Assessment and Plan   Essential hypertension S: Medication: None   -Metoprolol 12.5 mg extended release for A-fib but stopped due to bradycardia -Also stopped amlodipine 2.5mg  in the past - stopped hctz 12.5mg  due to loewr BP and dizziness.   At last visit blood pressure was high normal at 138 but since that time appears to have been trending up BP Readings from Last 3 Encounters:  06/11/23 (!) 154/78  03/23/23 (!) 163/101   02/06/23 (!) 148/79  A/P: hypertension doing well without medications in in the past but now trending up/poorly controlled-start amlodipine 2.5 mg.  Has had some swelling in his feet in the past and will need to monitor.  1 to 40-month follow-up recommended   Recommended follow up: Return in about 1 month (around 07/11/2023) for followup or sooner if needed.Schedule b4 you leave. Future Appointments  Date Time Provider Department Center  07/02/2023  1:30 PM [facility] ANNUAL WELLNESS VISIT 1 [facility]  07/15/2023 11:20 AM Shelva Majestic, MD [facility]  07/31/2023  9:00 AM Gwynneth Munson, Sung Amabile, PA-C [facility] None    Lab/Order associations:   ICD-10-CM   1. Essential hypertension  I10    Meds ordered this encounter  Medications   amLODipine (NORVASC) 2.5 MG tablet    Sig: Take 1 tablet (2.5 mg total) by mouth daily.    Dispense:  90 tablet    Refill:  3    Return precautions advised.  Tana Conch, MD
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
## 2023-06-25 ENCOUNTER — Ambulatory Visit (INDEPENDENT_AMBULATORY_CARE_PROVIDER_SITE_OTHER): Payer: PPO | Admitting: Physician Assistant

## 2023-06-25 ENCOUNTER — Encounter: Payer: Self-pay | Admitting: Physician Assistant

## 2023-06-25 VITALS — BP 130/80 | HR 70 | Temp 98.0°F | Ht 70.0 in | Wt 159.0 lb

## 2023-06-25 DIAGNOSIS — R21 Rash and other nonspecific skin eruption: Secondary | ICD-10-CM

## 2023-06-25 MED ORDER — TRIAMCINOLONE ACETONIDE 0.1 % EX CREA: TOPICAL_CREAM | CUTANEOUS | 0 refills | Status: DC

## 2023-06-25 NOTE — Patient Instructions (Signed)
It was great to see you!  Suspect possible post-shingles rash or contact dermatitis Due to how itchy it is, please trial the topical ointment I sent in  Use 1-2 times daily  Let us know if no better or any worse  Take care,  Jarold Motto PA-C

## 2023-07-07 ENCOUNTER — Ambulatory Visit (HOSPITAL_BASED_OUTPATIENT_CLINIC_OR_DEPARTMENT_OTHER): Payer: PPO

## 2023-07-07 DIAGNOSIS — I350 Nonrheumatic aortic (valve) stenosis: Secondary | ICD-10-CM

## 2023-07-07 LAB — ECHOCARDIOGRAM COMPLETE
AR max vel: 1.15 cm2
AV Area VTI: 1.1 cm2
AV Area mean vel: 1.06 cm2
AV Mean grad: 16 mm[Hg]
AV Peak grad: 30.5 mm[Hg]
Ao pk vel: 2.76 m/s
Area-P 1/2: 3.31 cm2
MV M vel: 5.68 m/s
MV Peak grad: 129 mm[Hg]
P 1/2 time: 459 ms
S' Lateral: 2.46 cm

## 2023-07-15 ENCOUNTER — Encounter: Payer: Self-pay | Admitting: Family Medicine

## 2023-07-15 ENCOUNTER — Ambulatory Visit (INDEPENDENT_AMBULATORY_CARE_PROVIDER_SITE_OTHER): Payer: PPO | Admitting: Family Medicine

## 2023-07-15 VITALS — BP 128/72 | HR 72 | Temp 98.0°F | Ht 70.0 in | Wt 158.2 lb

## 2023-07-15 DIAGNOSIS — I480 Paroxysmal atrial fibrillation: Secondary | ICD-10-CM

## 2023-07-15 DIAGNOSIS — I10 Essential (primary) hypertension: Secondary | ICD-10-CM | POA: Diagnosis not present

## 2023-07-15 NOTE — Patient Instructions (Addendum)
 You are eligible to schedule your annual wellness visit with our nurse specialist [place].  Please consider scheduling this before you leave today  Blood pressure looks much better- keep watching out for swelling in legs or feeling lightheaded  Recommended follow up: Return in about 4 months (around 11/12/2023) for followup or sooner if needed.Schedule b4 you leave.

## 2023-07-15 NOTE — Progress Notes (Signed)
 Phone [phone] In person visit   Subjective:   Calvin Burnett is a 89 y.o. year old very pleasant male patient who presents for/with See problem oriented charting Chief Complaint  Patient presents with   Follow-up    1 month f/u discuss Echo    Past Medical History-  Patient Active Problem List   Diagnosis Date Noted   Intra-abdominal abscess ([name]) 04/24/2019    Priority: High   B12 deficiency 12/04/2017    Priority: High   pT1pN0 colon cancer s/p robotic right colectomy 11/26/2017 10/02/2017    Priority: High   AF (paroxysmal atrial fibrillation) ([name]) 04/09/2017    Priority: High   Night sweats 09/16/2016    Priority: High   Major depressive disorder with single episode, in full remission ([name]) 07/29/2016    Priority: High   Rectal pain 10/14/2014    Priority: High   Type 2 diabetes mellitus ([name]) 07/31/2010    Priority: High   Vascular dementia ([name]) 07/27/2008    Priority: High   Mild aortic stenosis 04/05/2021    Priority: Medium    BPPV (benign paroxysmal positional vertigo) 12/17/2019    Priority: Medium    History of adenomatous polyp of colon 10/27/2014    Priority: Medium    Syncope 09/27/2014    Priority: Medium    CKD (chronic kidney disease), stage III ([name]) 06/01/2014    Priority: Medium    Hyperlipidemia associated with type 2 diabetes mellitus ([name]) 03/01/2014    Priority: Medium    Basal cell carcinoma 05/03/2009    Priority: Medium    Essential hypertension 01/30/2007    Priority: Medium    Gout 10/28/2019    Priority: Low   Aortic atherosclerosis ([name]) 01/29/2019    Priority: Low   Stricture of sigmoid s/p robotic sigmoidectomy 11/26/2017 11/26/2017    Priority: Low   Chronic anticoagulation 11/26/2017    Priority: Low   Right inguinal hernia 10/02/2017    Priority: Low   Bowel habit changes 10/14/2014    Priority: Low   Bradycardia 09/27/2014    Priority: Low   LEG CRAMPS, NOCTURNAL 04/30/2010    Priority: Low   Cervical  spondylosis without myelopathy 12/11/2007    Priority: Low   GERD (gastroesophageal reflux disease) 11/05/2007    Priority: Low    Medications- reviewed and updated Current Outpatient Medications  Medication Sig Dispense Refill   amLODipine  (NORVASC ) 2.5 MG tablet Take 1 tablet (2.5 mg total) by mouth daily. 90 tablet 3   cyanocobalamin  1000 MCG tablet Take 1,000 mcg by mouth 2 (two) times daily.     ELIQUIS  5 MG TABS tablet TAKE 1 TABLET BY MOUTH TWICE A DAY 60 tablet 6   latanoprost  (XALATAN ) 0.005 % ophthalmic solution Place 1 drop into both eyes at bedtime.   12   memantine  (NAMENDA  XR) 28 MG CP24 24 hr capsule TAKE 1 CAPSULE BY MOUTH EVERY DAY 90 capsule 1   metFORMIN  (GLUCOPHAGE -XR) 500 MG 24 hr tablet TAKE 1 TABLET BY MOUTH EVERY DAY WITH BREAKFAST 90 tablet 2   metoprolol  succinate (TOPROL -XL) 25 MG 24 hr tablet TAKE 1/2 TABLET BY MOUTH DAILY 45 tablet 1   rosuvastatin  (CRESTOR ) 20 MG tablet TAKE 1 TABLET (20 MG TOTAL) BY MOUTH WEEKLY 13 tablet 3   No current facility-administered medications for this visit.     Objective:  BP 128/72 (BP Location: Right Arm, Patient Position: Sitting, Cuff Size: Normal)   Pulse 72   Temp 98 F (36.7 C) (  Temporal)   Ht 5' 10 (1.778 m)   Wt 158 lb 3.2 oz (71.8 kg)   SpO2 96%   BMI 22.70 kg/m  Gen: NAD, resting comfortably CV: RRR no murmurs rubs or gallops Lungs: CTAB no crackles, wheeze, rhonchi Ext: no edema Skin: warm, dry    Assessment and Plan   #hypertension S: Medication: amlodipine  2.5 mg daily added back last visit, Metoprolol  12.5 mg extended release for A-fib  - stopped hctz 12.5mg  due to lower BP and dizziness in past.  BP Readings from Last 3 Encounters:  07/15/23 128/72  06/25/23 130/80  06/11/23 (!) 154/78  A/P: stable- continue current medicines  -prior edema issues on amlodipine  but not at present- will monitor  #Atrial fibrillation S: Patient is anticoagulated with Eliquis  5 mg twice daily-has been on 2.5 mg  if creatinine worsened in past  -rate control with metoprolol  12.5 mg extended release- prior bradycardia but seems to be tolerating well A/P: appropriately anticoagulated and rate controlled- continue current medicine    # Mild aortic stenosis-stable on echo 07/10/2023. We reviewed results today  Recommended follow up: Return in about 4 months (around 11/12/2023) for followup or sooner if needed.Schedule b4 you leave. Future Appointments  Date Time Provider Department Center  07/31/2023  9:00 AM Dina Camie BRAVO, PA-C [facility] None  11/12/2023 10:00 AM Katrinka Garnette KIDD, MD [facility]   Lab/Order associations:   ICD-10-CM   1. Essential hypertension  I10     2. AF (paroxysmal atrial fibrillation) ([name])  I48.0      Return precautions advised.  Garnette Katrinka, MD

## 2023-07-15 NOTE — Assessment & Plan Note (Signed)
 S: Medication: amlodipine  2.5 mg daily added back last visit, Metoprolol  12.5 mg extended release for A-fib  - stopped hctz 12.5mg  due to lower BP and dizziness in past.  BP Readings from Last 3 Encounters:  07/15/23 128/72  06/25/23 130/80  06/11/23 (!) 154/78  A/P: stable- continue current medicines  -prior edema issues on amlodipine  but not at present- will monitor

## 2023-07-31 ENCOUNTER — Encounter: Payer: Self-pay | Admitting: Physician Assistant

## 2023-07-31 ENCOUNTER — Ambulatory Visit: Payer: PPO | Admitting: Physician Assistant

## 2023-07-31 VITALS — BP 139/86 | HR 113 | Resp 20 | Ht 70.0 in | Wt 158.0 lb

## 2023-07-31 DIAGNOSIS — F01A0 Vascular dementia, mild, without behavioral disturbance, psychotic disturbance, mood disturbance, and anxiety: Secondary | ICD-10-CM | POA: Diagnosis not present

## 2023-07-31 MED ORDER — MEMANTINE HCL ER 28 MG PO CP24: 28.0000 mg | ORAL_CAPSULE | Freq: Every day | ORAL | 3 refills | Status: DC

## 2023-07-31 MED ORDER — ESCITALOPRAM OXALATE 5 MG PO TABS: 5.0000 mg | ORAL_TABLET | Freq: Every day | ORAL | 3 refills | Status: DC
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
## 2023-07-31 NOTE — Patient Instructions (Signed)
It was a pleasure to see you today at our office.   Recommendations:  Follow up in 6  months Continue Namenda 28 mg XR Start  Lexapro 5 mg daily Continue B12 supplements  Whom to call:  Memory  decline, memory medications: Call our office [phone]   For psychiatric meds, mood meds: Please have your primary care physician manage these medications.      For assessment of decision of mental capacity and competency:  Call Dr. Erick Blinks, geriatric psychiatrist at [phone]  For guidance in geriatric dementia issues please call [facility] [phone]  For guidance regarding [facility] and if placement were needed at the facility, contact Sidney Ace, Social Worker tel: [phone]  If you have any severe symptoms of a stroke, or other severe issues such as confusion,severe chills or fever, etc call 911 or go to the ER as you may need to be evaluated further     RECOMMENDATIONS FOR ALL PATIENTS WITH MEMORY PROBLEMS: 1. Continue to exercise (Recommend 30 minutes of walking everyday, or 3 hours every week) 2. Increase social interactions - continue going to [place] and enjoy social gatherings with friends and family 3. Eat healthy, avoid fried foods and eat more fruits and vegetables 4. Maintain adequate blood pressure, blood sugar, and blood cholesterol level. Reducing the risk of stroke and cardiovascular disease also helps promoting better memory. 5. Avoid stressful situations. Live a simple life and avoid aggravations. Organize your time and prepare for the next day in anticipation. 6. Sleep well, avoid any interruptions of sleep and avoid any distractions in the bedroom that may interfere with adequate sleep quality 7. Avoid sugar, avoid sweets as there is a strong link between excessive sugar intake, diabetes, and cognitive impairment We discussed the Mediterranean diet, which has been shown to help patients reduce the risk of  progressive memory disorders and reduces cardiovascular risk. This includes eating fish, eat fruits and green leafy vegetables, nuts like almonds and hazelnuts, walnuts, and also use olive oil. Avoid fast foods and fried foods as much as possible. Avoid sweets and sugar as sugar use has been linked to worsening of memory function.  There is always a concern of gradual progression of memory problems. If this is the case, then we may need to adjust level of care according to patient needs. Support, both to the patient and caregiver, should then be put into place.    FALL PRECAUTIONS: Be cautious when walking. Scan the area for obstacles that may increase the risk of trips and falls. When getting up in the mornings, sit up at the edge of the bed for a few minutes before getting out of bed. Consider elevating the bed at the head end to avoid drop of blood pressure when getting up. Walk always in a well-lit room (use night lights in the walls). Avoid area rugs or power cords from appliances in the middle of the walkways. Use a walker or a cane if necessary and consider physical therapy for balance exercise. Get your eyesight checked regularly.  FINANCIAL OVERSIGHT: Supervision, especially oversight when making financial decisions or transactions is also recommended.  HOME SAFETY: Consider the safety of the kitchen when operating appliances like stoves, microwave oven, and blender. Consider having supervision and share cooking responsibilities until no longer able to participate in those. Accidents with firearms and other hazards in the house should be identified and addressed as well.   ABILITY TO BE LEFT ALONE: If patient is unable  to contact 911 operator, consider using LifeLine, or when the need is there, arrange for someone to stay with patients. Smoking is a fire hazard, consider supervision or cessation. Risk of wandering should be assessed by caregiver and if detected at any point, supervision and safe  proof recommendations should be instituted.  MEDICATION SUPERVISION: Inability to self-administer medication needs to be constantly addressed. Implement a mechanism to ensure safe administration of the medications.   DRIVING: Regarding driving, in patients with progressive memory problems, driving will be impaired. We advise to have someone else do the driving if trouble finding directions or if minor accidents are reported. Independent driving assessment is available to determine safety of driving.   If you are interested in the driving assessment, you can contact the following:  [facility] [employer] [facility] in [place] [phone]  [facility] [phone]  [facility] [phone]  [facility] [phone] or [phone]

## 2023-08-13 DIAGNOSIS — C44619 Basal cell carcinoma of skin of left upper limb, including shoulder: Secondary | ICD-10-CM | POA: Diagnosis not present

## 2023-08-13 DIAGNOSIS — L858 Other specified epidermal thickening: Secondary | ICD-10-CM | POA: Diagnosis not present

## 2023-08-13 DIAGNOSIS — D225 Melanocytic nevi of trunk: Secondary | ICD-10-CM | POA: Diagnosis not present

## 2023-08-13 DIAGNOSIS — C44519 Basal cell carcinoma of skin of other part of trunk: Secondary | ICD-10-CM | POA: Diagnosis not present

## 2023-08-13 DIAGNOSIS — L821 Other seborrheic keratosis: Secondary | ICD-10-CM | POA: Diagnosis not present

## 2023-08-13 DIAGNOSIS — Z85828 Personal history of other malignant neoplasm of skin: Secondary | ICD-10-CM | POA: Diagnosis not present

## 2023-08-13 DIAGNOSIS — C44311 Basal cell carcinoma of skin of nose: Secondary | ICD-10-CM | POA: Diagnosis not present

## 2023-08-13 DIAGNOSIS — D1801 Hemangioma of skin and subcutaneous tissue: Secondary | ICD-10-CM | POA: Diagnosis not present

## 2023-08-13 DIAGNOSIS — D2262 Melanocytic nevi of left upper limb, including shoulder: Secondary | ICD-10-CM | POA: Diagnosis not present

## 2023-08-13 DIAGNOSIS — L570 Actinic keratosis: Secondary | ICD-10-CM | POA: Diagnosis not present

## 2023-08-13 DIAGNOSIS — D485 Neoplasm of uncertain behavior of skin: Secondary | ICD-10-CM | POA: Diagnosis not present

## 2023-10-02 DIAGNOSIS — C44311 Basal cell carcinoma of skin of nose: Secondary | ICD-10-CM | POA: Diagnosis not present

## 2023-10-02 DIAGNOSIS — Z85828 Personal history of other malignant neoplasm of skin: Secondary | ICD-10-CM | POA: Diagnosis not present

## 2023-10-07 NOTE — Telephone Encounter (Signed)
 Please see message and advise any medication dose changes needed to place orders for patient

## 2023-10-09 ENCOUNTER — Other Ambulatory Visit: Payer: Self-pay

## 2023-10-09 MED ORDER — VALSARTAN 40 MG PO TABS: 40.0000 mg | ORAL_TABLET | Freq: Every day | ORAL | 3 refills | Status: DC

## 2023-10-09 NOTE — Telephone Encounter (Signed)
 Spoke with [name] and advised stopping Amlodipine medication and starting the Valsartan  40mg  dose once daily. Rx sent to pharmacy for patient to start. Daughter advised she will take the Amlodipine away so there is no more confusion on what medication to take going forward.

## 2023-11-07 ENCOUNTER — Encounter: Payer: Self-pay | Admitting: Physician Assistant

## 2023-11-10 ENCOUNTER — Other Ambulatory Visit: Payer: Self-pay | Admitting: Family Medicine

## 2023-11-12 ENCOUNTER — Encounter: Payer: Self-pay | Admitting: Family Medicine

## 2023-11-12 ENCOUNTER — Ambulatory Visit: Payer: PPO | Admitting: Family Medicine

## 2023-11-12 VITALS — BP 140/70 | HR 73 | Temp 98.1°F | Ht 70.0 in | Wt 156.8 lb

## 2023-11-12 DIAGNOSIS — I10 Essential (primary) hypertension: Secondary | ICD-10-CM

## 2023-11-12 DIAGNOSIS — E1169 Type 2 diabetes mellitus with other specified complication: Secondary | ICD-10-CM | POA: Diagnosis not present

## 2023-11-12 DIAGNOSIS — Z7984 Long term (current) use of oral hypoglycemic drugs: Secondary | ICD-10-CM | POA: Diagnosis not present

## 2023-11-12 DIAGNOSIS — E785 Hyperlipidemia, unspecified: Secondary | ICD-10-CM

## 2023-11-12 DIAGNOSIS — I480 Paroxysmal atrial fibrillation: Secondary | ICD-10-CM | POA: Diagnosis not present

## 2023-11-12 DIAGNOSIS — N1831 Chronic kidney disease, stage 3a: Secondary | ICD-10-CM | POA: Diagnosis not present

## 2023-11-12 DIAGNOSIS — E1122 Type 2 diabetes mellitus with diabetic chronic kidney disease: Secondary | ICD-10-CM

## 2023-11-12 LAB — COMPREHENSIVE METABOLIC PANEL WITH GFR
ALT: 17 U/L (ref 0–53)
AST: 23 U/L (ref 0–37)
Albumin: 4 g/dL (ref 3.5–5.2)
Alkaline Phosphatase: 92 U/L (ref 39–117)
BUN: 18 mg/dL (ref 6–23)
CO2: 30 meq/L (ref 19–32)
Calcium: 8.9 mg/dL (ref 8.4–10.5)
Chloride: 101 meq/L (ref 96–112)
Creatinine, Ser: 1.34 mg/dL (ref 0.40–1.50)
GFR: 47.48 mL/min — ABNORMAL LOW (ref >60.00)
Glucose, Bld: 161 mg/dL — ABNORMAL HIGH (ref 70–99)
Potassium: 3.6 meq/L (ref 3.5–5.1)
Sodium: 138 meq/L (ref 135–145)
Total Bilirubin: 0.9 mg/dL (ref 0.2–1.2)
Total Protein: 7.2 g/dL (ref 6.0–8.3)

## 2023-11-12 LAB — MICROALBUMIN / CREATININE URINE RATIO
Creatinine,U: 65.4 mg/dL
Microalb Creat Ratio: 1160.3 mg/g — ABNORMAL HIGH (ref 0.0–30.0)
Microalb, Ur: 75.9 mg/dL — ABNORMAL HIGH (ref 0.0–1.9)

## 2023-11-12 LAB — HEMOGLOBIN A1C: Hgb A1c MFr Bld: 6.2 % (ref 4.6–6.5)

## 2023-11-12 LAB — LDL CHOLESTEROL, DIRECT: Direct LDL: 87 mg/dL

## 2023-11-12 MED ORDER — VALSARTAN 80 MG PO TABS: 80.0000 mg | ORAL_TABLET | Freq: Every day | ORAL | 3 refills | Status: DC

## 2023-11-12 NOTE — Progress Notes (Signed)
 Phone [phone] In person visit   Subjective:   Calvin Burnett is a 89 y.o. year old very pleasant male patient who presents for/with See problem oriented charting Chief Complaint  Patient presents with   Medical Management of Chronic Issues   Hypertension   Diabetes    Past Medical History-  Patient Active Problem List   Diagnosis Date Noted   Intra-abdominal abscess ([name]) 04/24/2019    Priority: High   B12 deficiency 12/04/2017    Priority: High   pT1pN0 colon cancer s/p robotic right colectomy 11/26/2017 10/02/2017    Priority: High   AF (paroxysmal atrial fibrillation) ([name]) 04/09/2017    Priority: High   Night sweats 09/16/2016    Priority: High   Major depressive disorder with single episode, in full remission ([name]) 07/29/2016    Priority: High   Rectal pain 10/14/2014    Priority: High   Type 2 diabetes mellitus ([name]) 07/31/2010    Priority: High   Vascular dementia ([name]) 07/27/2008    Priority: High   Mild aortic stenosis 04/05/2021    Priority: Medium    BPPV (benign paroxysmal positional vertigo) 12/17/2019    Priority: Medium    History of adenomatous polyp of colon 10/27/2014    Priority: Medium    Syncope 09/27/2014    Priority: Medium    CKD (chronic kidney disease), stage III ([name]) 06/01/2014    Priority: Medium    Hyperlipidemia associated with type 2 diabetes mellitus ([name]) 03/01/2014    Priority: Medium    Basal cell carcinoma 05/03/2009    Priority: Medium    Essential hypertension 01/30/2007    Priority: Medium    Gout 10/28/2019    Priority: Low   Aortic atherosclerosis ([name]) 01/29/2019    Priority: Low   Stricture of sigmoid s/p robotic sigmoidectomy 11/26/2017 11/26/2017    Priority: Low   Chronic anticoagulation 11/26/2017    Priority: Low   Right inguinal hernia 10/02/2017    Priority: Low   Bowel habit changes 10/14/2014    Priority: Low   Bradycardia 09/27/2014    Priority: Low   LEG CRAMPS, NOCTURNAL 04/30/2010     Priority: Low   Cervical spondylosis without myelopathy 12/11/2007    Priority: Low   GERD (gastroesophageal reflux disease) 11/05/2007    Priority: Low    Medications- reviewed and updated Current Outpatient Medications  Medication Sig Dispense Refill   amLODipine  (NORVASC ) 2.5 MG tablet -had instructed to stop Take 1 tablet (2.5 mg total) by mouth daily. 90 tablet 3   cyanocobalamin  1000 MCG tablet Take 1,000 mcg by mouth 2 (two) times daily.     ELIQUIS  5 MG TABS tablet TAKE 1 TABLET BY MOUTH TWICE A DAY 60 tablet 6   escitalopram  (LEXAPRO ) 5 MG tablet Take 1 tablet (5 mg total) by mouth daily. 90 tablet 3   latanoprost  (XALATAN ) 0.005 % ophthalmic solution Place 1 drop into both eyes at bedtime.   12   memantine  (NAMENDA  XR) 28 MG CP24 24 hr capsule Take 1 capsule (28 mg total) by mouth daily. 90 capsule 3   metFORMIN  (GLUCOPHAGE -XR) 500 MG 24 hr tablet TAKE 1 TABLET BY MOUTH EVERY DAY WITH BREAKFAST 90 tablet 2   metoprolol  succinate (TOPROL -XL) 25 MG 24 hr tablet TAKE 1/2 TABLET BY MOUTH DAILY 45 tablet 1   rosuvastatin  (CRESTOR ) 20 MG tablet TAKE 1 TABLET (20 MG TOTAL) BY MOUTH WEEKLY 13 tablet 3   valsartan  (DIOVAN ) 40 MG tablet Take 1 tablet (40  mg total) by mouth daily. 90 tablet 3   No current facility-administered medications for this visit.     Objective:  BP (!) 140/70 Comment: no improvement on repeat  Pulse 73   Temp 98.1 F (36.7 C)   Ht 5\' 10"  (1.778 m)   Wt 156 lb 12.8 oz (71.1 kg)   SpO2 96%   BMI 22.50 kg/m  Gen: NAD, resting comfortably CV: RRR no murmurs rubs or gallops Lungs: CTAB no crackles, wheeze, rhonchi Abdomen: soft/nontender/nondistended/normal bowel sounds. No rebound or guarding.  Ext: trace edema Skin: warm, dry     Assessment and Plan   #Vascular dementia- Dr. Ty Gales S: Patient is compliant with memantine /Namenda  28 mg daily.  Patient has been on memantine  for many years.  -Neurology evaluated escitalopram  5 mg due to him feeling  more withdrawn January 2025  A/P: dementia stable - continue current medications and neurology follow up - feels less withdrawn on Lexapro  5 mg- continue current medications    #Atrial fibrillation S: Patient is anticoagulated with Eliquis  5 mg twice daily-has been on 2.5 mg if creatinine worsened in past  -rate control with metoprolol  12.5 mg extended release- prior bradycardia A/P: appropriately anticoagulated and rate controlled- continue current medicine     # Diabetes S: Compliant with Metformin  500 mg XR daily with breakfast. Eats relatively clean diet Lab Results  Component Value Date   HGBA1C 6.2 06/11/2023   HGBA1C 6.8 (H) 11/28/2022   HGBA1C 6.5 05/30/2022   A/P: hopefully stable- update a1c today. Continue current meds for now    #hyperlipidemia/history of stroke-may have been atrial fibrillation related S: compliant with rosuvastatin  20 mg weekly Lab Results  Component Value Date   CHOL 137 06/11/2023   HDL 42.40 06/11/2023   LDLCALC 78 06/11/2023   LDLDIRECT 57.0 11/23/2020   TRIG 84.0 06/11/2023   CHOLHDL 3 06/11/2023  A/P: lipids hair above goal last visit- encouraged consistency- recheck today and may go to twice weekly if still above 70   #hypertension/CKD stage III S: Medication:Metoprolol  12.5 mg extended release for A-fib  -Added valsartan  40 mg due to microalbuminuria in March 2025 discovered in relation to prior [facility] lab error  -still walking regularly  CKD stage III with creatinine ranging from 1.3-1.6 for the most part.  Night sweats in the past on ramipril  and urine microalbumin to creatinine ratio has not been elevated so we have not restarted ACE inhibitor. Later with lab error discovered had been high and improved on ramipril - opted to trial valsartan  and thankfully not having night sweats  A/P: blood pressure mildly elevated on metoprolol  plus valsartan  40 mg after we stopped the amlodipine  2.5 mg- we will try to go up to max tolerable dose on  valsartan  as protective for kidneys- check bloodwork today and UACR -recheck 1 month  # Mild aortic stenosis-stable on echo 07/10/2023  Recommended follow up: Return in about 1 month (around 12/12/2023) for followup or sooner if needed.Schedule b4 you leave. Future Appointments  Date Time Provider Department Center  12/17/2023 10:00 AM [facility] ANNUAL WELLNESS VISIT 1 [facility]  02/23/2024 10:00 AM Wertman, Adriane Albe, PA-C [facility] None    Lab/Order associations:   ICD-10-CM   1. AF (paroxysmal atrial fibrillation) ([name])  I48.0     2. Type 2 diabetes mellitus with chronic kidney disease, without long-term current use of insulin , unspecified CKD stage ([name])  E11.22 Comprehensive metabolic panel with GFR    Hemoglobin A1c    Microalbumin / creatinine urine  ratio    3. Essential hypertension  I10 Comprehensive metabolic panel with GFR    4. Hyperlipidemia associated with type 2 diabetes mellitus ([name])  E11.69 Comprehensive metabolic panel with GFR   E78.5 LDL cholesterol, direct      Meds ordered this encounter  Medications   valsartan  (DIOVAN ) 80 MG tablet    Sig: Take 1 tablet (80 mg total) by mouth daily.    Dispense:  90 tablet    Refill:  3    Return precautions advised.  Clarisa Crooked, MD

## 2023-11-12 NOTE — Patient Instructions (Addendum)
-  make sure off amlodipine  2.5 mg - increase valsartan  to 80 mg- trying to get to max tolerable dose and also get blood pressure <135/85 with these changes  Please stop by lab before you go If you have mychart- we will send your results within 3 business days of us  receiving them.  If you do not have mychart- we will call you about results within 5 business days of us  receiving them.  *please also note that you will see labs on mychart as soon as they post. I will later go in and write notes on them- will say "notes from Dr. Arlene Ben"   Recommended follow up: Return in about 1 month (around 12/12/2023) for followup or sooner if needed.Schedule b4 you leave.

## 2023-11-13 ENCOUNTER — Encounter: Payer: Self-pay | Admitting: Family Medicine

## 2023-11-14 ENCOUNTER — Other Ambulatory Visit: Payer: Self-pay | Admitting: *Deleted

## 2023-11-14 MED ORDER — ROSUVASTATIN CALCIUM 20 MG PO TABS: 20.0000 mg | ORAL_TABLET | ORAL | 3 refills | Status: DC

## 2023-11-14 NOTE — Telephone Encounter (Signed)
Patient returned Donna's call. Requests to be called.

## 2023-12-06 ENCOUNTER — Other Ambulatory Visit: Payer: Self-pay | Admitting: Cardiology

## 2023-12-06 DIAGNOSIS — I480 Paroxysmal atrial fibrillation: Secondary | ICD-10-CM

## 2023-12-09 NOTE — Telephone Encounter (Signed)
 Pt last saw Dr Lawana Pray 09/10/22.  Pt is overdue for follow-up.  Pt is overdue for 6 month follow-up, msg sent to schedulers to contact pt for appt. Last labs 11/12/23 Creat 1.34, age 89, weight 71.1kg, based on specified criteria pt is on appropriate dosage of Eliquis  5mg  BID for afib.  Will await appt to refill rx.

## 2023-12-11 ENCOUNTER — Other Ambulatory Visit: Payer: Self-pay | Admitting: Family Medicine

## 2023-12-11 ENCOUNTER — Ambulatory Visit: Payer: Self-pay

## 2023-12-11 DIAGNOSIS — C44319 Basal cell carcinoma of skin of other parts of face: Secondary | ICD-10-CM | POA: Diagnosis not present

## 2023-12-11 DIAGNOSIS — C4401 Basal cell carcinoma of skin of lip: Secondary | ICD-10-CM | POA: Diagnosis not present

## 2023-12-11 DIAGNOSIS — L821 Other seborrheic keratosis: Secondary | ICD-10-CM | POA: Diagnosis not present

## 2023-12-11 DIAGNOSIS — D692 Other nonthrombocytopenic purpura: Secondary | ICD-10-CM | POA: Diagnosis not present

## 2023-12-11 DIAGNOSIS — D2262 Melanocytic nevi of left upper limb, including shoulder: Secondary | ICD-10-CM | POA: Diagnosis not present

## 2023-12-11 DIAGNOSIS — D1801 Hemangioma of skin and subcutaneous tissue: Secondary | ICD-10-CM | POA: Diagnosis not present

## 2023-12-11 DIAGNOSIS — D485 Neoplasm of uncertain behavior of skin: Secondary | ICD-10-CM | POA: Diagnosis not present

## 2023-12-11 DIAGNOSIS — L570 Actinic keratosis: Secondary | ICD-10-CM | POA: Diagnosis not present

## 2023-12-11 DIAGNOSIS — Z85828 Personal history of other malignant neoplasm of skin: Secondary | ICD-10-CM | POA: Diagnosis not present

## 2023-12-11 DIAGNOSIS — I480 Paroxysmal atrial fibrillation: Secondary | ICD-10-CM

## 2023-12-11 DIAGNOSIS — D225 Melanocytic nevi of trunk: Secondary | ICD-10-CM | POA: Diagnosis not present

## 2023-12-11 NOTE — Telephone Encounter (Signed)
 See below in Shannon's absence.

## 2023-12-11 NOTE — Telephone Encounter (Unsigned)
 Copied from CRM [id number]. Topic: Clinical - Medication Refill >> Dec 11, 2023  9:23 AM Juleen Oakland F wrote: Medication: ELIQUIS  5 MG TABS tablet   Has the patient contacted their pharmacy? Yes (Agent: If no, request that the patient contact the pharmacy for the refill. If patient does not wish to contact the pharmacy document the reason why and proceed with request.) (Agent: If yes, when and what did the pharmacy advise?)  This is the patient's preferred pharmacy:   [id number] - [place], [place] - [address] AT [address] OF [facility] [address] [place]  [address] Phone: [phone] Fax: [phone]  Is this the correct pharmacy for this prescription? Yes If no, delete pharmacy and type the correct one.   Has the prescription been filled recently? Yes  Is the patient out of the medication? Yes  Has the patient been seen for an appointment in the last year OR does the patient have an upcoming appointment? Yes  Can we respond through MyChart? No  Agent: Please be advised that Rx refills may take up to 3 business days. We ask that you follow-up with your pharmacy.

## 2023-12-11 NOTE — Telephone Encounter (Signed)
 This has been managed by Dr. Georgean Kindle is an active request but they have asked patient to schedule a visit at least-please call patient and encouraged him to schedule a visit and reach back out to cardiology once that is done-it looks like they are willing to refill it and if not I certainly do not want him to be out and we can get short-term fill until next cardiology visit

## 2023-12-11 NOTE — Telephone Encounter (Signed)
 Duplicate request. Per chart, this medication was requested earlier today.

## 2023-12-11 NOTE — Telephone Encounter (Signed)
 Called pt to make him aware of overdue office visit, no answer. Left message on voicemail.

## 2023-12-12 ENCOUNTER — Telehealth: Payer: Self-pay | Admitting: Cardiology

## 2023-12-12 NOTE — Telephone Encounter (Signed)
 Pt has scheduled appt on 02/12/24 with Dr Lawana Pray. Refill sent.

## 2023-12-12 NOTE — Telephone Encounter (Signed)
 Pt scheduled for 02/12/24

## 2023-12-12 NOTE — Telephone Encounter (Signed)
 This was sent to Dr. Arlene Ben yesterday.

## 2023-12-12 NOTE — Telephone Encounter (Signed)
 Pt last saw Dr Lawana Pray 09/10/22. Pt is overdue for follow-up. Pt is overdue for 6 month follow-up, msg sent to schedulers to contact pt for appt. I also called pt multiple times to make pt aware of overdue office visit. Last labs 11/12/23 Creat 1.34, age 89, weight 71.1kg, based on specified criteria pt is on appropriate dosage of Eliquis  5mg  BID for afib. Will await appt to refill rx.

## 2023-12-12 NOTE — Telephone Encounter (Signed)
*  STAT* If patient is at the pharmacy, call can be transferred to refill team.   1. Which medications need to be refilled? (please list name of each medication and dose if known) ELIQUIS  5 MG TABS tablet     4. Which pharmacy/location (including street and city if local pharmacy) is medication to be sent to?  [id number] - [place], [place] - [address] AT [address] [facility] [address]     5. Do they need a 30 day or 90 day supply? 90

## 2023-12-17 ENCOUNTER — Ambulatory Visit

## 2023-12-22 ENCOUNTER — Ambulatory Visit: Admitting: Family Medicine

## 2023-12-22 ENCOUNTER — Encounter: Payer: Self-pay | Admitting: Family Medicine

## 2023-12-22 VITALS — BP 140/78 | HR 55 | Temp 97.4°F | Ht 70.0 in | Wt 155.2 lb

## 2023-12-22 DIAGNOSIS — I350 Nonrheumatic aortic (valve) stenosis: Secondary | ICD-10-CM | POA: Diagnosis not present

## 2023-12-22 DIAGNOSIS — N1831 Chronic kidney disease, stage 3a: Secondary | ICD-10-CM

## 2023-12-22 DIAGNOSIS — I10 Essential (primary) hypertension: Secondary | ICD-10-CM

## 2023-12-22 DIAGNOSIS — R809 Proteinuria, unspecified: Secondary | ICD-10-CM | POA: Diagnosis not present

## 2023-12-22 NOTE — Patient Instructions (Addendum)
 Team request copy of last diabetes eye exam  You are eligible to schedule your annual wellness visit with our nurse specialist Brian Campanile.  Please consider scheduling this before you leave today  Reasonable to check blood pressure at home- omron series 3 is a reasonably priced one  blood pressure still slightly above goal BUT with side effects do not think we can increase this dose so we opted to continue current medications   Recommended follow up: Return in about 3 months (around 03/23/2024) for followup or sooner if needed.Schedule b4 you leave.

## 2023-12-22 NOTE — Progress Notes (Signed)
 Phone [phone] In person visit   Subjective:   Calvin Burnett is a 89 y.o. year old very pleasant male patient who presents for/with See problem oriented charting Chief Complaint  Patient presents with   1 month f/u    Past Medical History-  Patient Active Problem List   Diagnosis Date Noted   Intra-abdominal abscess ([name]) 04/24/2019    Priority: High   B12 deficiency 12/04/2017    Priority: High   pT1pN0 colon cancer s/p robotic right colectomy 11/26/2017 10/02/2017    Priority: High   AF (paroxysmal atrial fibrillation) ([name]) 04/09/2017    Priority: High   Night sweats 09/16/2016    Priority: High   Major depressive disorder with single episode, in full remission ([name]) 07/29/2016    Priority: High   Rectal pain 10/14/2014    Priority: High   Type 2 diabetes mellitus ([name]) 07/31/2010    Priority: High   Vascular dementia ([name]) 07/27/2008    Priority: High   Mild aortic stenosis 04/05/2021    Priority: Medium    BPPV (benign paroxysmal positional vertigo) 12/17/2019    Priority: Medium    History of adenomatous polyp of colon 10/27/2014    Priority: Medium    Syncope 09/27/2014    Priority: Medium    CKD (chronic kidney disease), stage III ([name]) 06/01/2014    Priority: Medium    Hyperlipidemia associated with type 2 diabetes mellitus ([name]) 03/01/2014    Priority: Medium    Basal cell carcinoma 05/03/2009    Priority: Medium    Essential hypertension 01/30/2007    Priority: Medium    Gout 10/28/2019    Priority: Low   Aortic atherosclerosis ([name]) 01/29/2019    Priority: Low   Stricture of sigmoid s/p robotic sigmoidectomy 11/26/2017 11/26/2017    Priority: Low   Chronic anticoagulation 11/26/2017    Priority: Low   Right inguinal hernia 10/02/2017    Priority: Low   Bowel habit changes 10/14/2014    Priority: Low   Bradycardia 09/27/2014    Priority: Low   LEG CRAMPS, NOCTURNAL 04/30/2010    Priority: Low   Cervical spondylosis without myelopathy  12/11/2007    Priority: Low   GERD (gastroesophageal reflux disease) 11/05/2007    Priority: Low    Medications- reviewed and updated Current Outpatient Medications  Medication Sig Dispense Refill   apixaban  (ELIQUIS ) 5 MG TABS tablet TAKE 1 TABLET BY MOUTH TWICE A DAY 180 tablet 0   cyanocobalamin  1000 MCG tablet Take 1,000 mcg by mouth 2 (two) times daily.     escitalopram  (LEXAPRO ) 5 MG tablet Take 1 tablet (5 mg total) by mouth daily. 90 tablet 3   latanoprost  (XALATAN ) 0.005 % ophthalmic solution Place 1 drop into both eyes at bedtime.   12   memantine  (NAMENDA  XR) 28 MG CP24 24 hr capsule Take 1 capsule (28 mg total) by mouth daily. 90 capsule 3   metFORMIN  (GLUCOPHAGE -XR) 500 MG 24 hr tablet TAKE 1 TABLET BY MOUTH EVERY DAY WITH BREAKFAST 90 tablet 2   metoprolol  succinate (TOPROL -XL) 25 MG 24 hr tablet TAKE 1/2 TABLET BY MOUTH DAILY 45 tablet 1   rosuvastatin  (CRESTOR ) 20 MG tablet Take 1 tablet (20 mg total) by mouth 2 (two) times a week. 26 tablet 3   valsartan  (DIOVAN ) 80 MG tablet Take 1 tablet (80 mg total) by mouth daily. 90 tablet 3   No current facility-administered medications for this visit.     Objective:  BP (!) 140/78  Comment: no ipmrovement on repeat  Pulse (!) 55   Temp (!) 97.4 F (36.3 C)   Ht 5\' 10"  (1.778 m)   Wt 155 lb 3.2 oz (70.4 kg)   SpO2 96%   BMI 22.27 kg/m  Gen: NAD, resting comfortably CV: irregularly irregular  stable murmur  lungs: CTAB no crackles, wheeze, rhonchi Ext: trace edema Skin: warm, dry     Assessment and Plan   #hypertension/CKD stage III S: Medication: Metoprolol  12.5 mg extended release for A-fib  -Added valsartan  40 mg due to microalbuminuria in March 2025 discovered in relation to prior [place] lab error- later up to 80 mg  at last visit but does feel slightly shaky with this.  - stopped amlodipine  to allow space for valsartan  - stopped hctz 12.5mg  due to lower BP and dizziness.   CKD stage III with creatinine  ranging from 1.3-1.6 for the most part with last visit being at 1.34 with GFR of 47  Night sweats in the past on ramipril  and urine microalbumin to creatinine ratio has not been elevated so we have not restarted ACE inhibitor. Later with lab error discovered had been high and improved on ramipril - opted to trial valsartan  and thankfully not having night sweats - but shaky at times and balance off A/P: blood pressure still slightly above goal BUT with side effects do not think we can increase this dose so we opted to continue current medications  - kidney function has been pretty stable- we opted to recheck this along with the UACr at next visit.  Microalbuminuria was worsening but unfortunately we cannot increase dose any further due to side effects  # Mild aortic stenosis-stable on echo 07/10/2023-- Noted murmur-likely 3 to 5-year repeat   Recommended follow up: Return in about 3 months (around 03/23/2024) for followup or sooner if needed.Schedule b4 you leave. Future Appointments  Date Time Provider Department Center  02/12/2024  9:30 AM Lei Pump, MD [facility]  02/23/2024 10:00 AM Rosi Converse, PA-C [facility] None  03/25/2024  1:00 PM Almira Jaeger, MD [facility]    Lab/Order associations:   ICD-10-CM   1. Essential hypertension  I10     2. Stage 3a chronic kidney disease ([name])  N18.31     3. Microalbuminuria  R80.9     4. Mild aortic stenosis  I35.0       Return precautions advised.  Clarisa Crooked, MD

## 2024-01-15 ENCOUNTER — Ambulatory Visit: Admitting: Physician Assistant

## 2024-01-15 DIAGNOSIS — C4401 Basal cell carcinoma of skin of lip: Secondary | ICD-10-CM | POA: Diagnosis not present

## 2024-01-15 DIAGNOSIS — Z85828 Personal history of other malignant neoplasm of skin: Secondary | ICD-10-CM | POA: Diagnosis not present

## 2024-01-15 NOTE — Progress Notes (Incomplete)
 Assessment/Plan:   Mild dementia likely due to vascular etiology***  Calvin Burnett is a very pleasant 89 y.o. RH male with a history of Afib on Eliquis , DM2, hyperlipidemia, HTN, CKD3, bradycardia, B12 deficiency and a history of memory impairment likely of vascular etiology seen today in follow up for memory loss. Patient is currently on memantine  XR 28 mg daily. He continues to perform ADLs and driving without difficulty.  Anxiety is controlled with Lexapro ***.      Follow up in   months. Continue memantine  XR 28 mg daily*** Continue Lexapro  5 mg daily, side effects discussed Recommend good control of her cardiovascular risk factors, she is on Eliquis  Continue to control mood as per PCP     Subjective:    This patient is accompanied in the office by his daughter*** who supplements the history.  Previous records as well as any outside records available were reviewed prior to todays visit. Patient was last seen on 07/20/2023 with MMSE 21/30.  .***   Any changes in memory since last visit?   Some of the bad days .  He enjoys watching game shows as well as the news.  Continues to have the same routine as prior he sticks to a routine .  Sometimes he forgets how to use the remote control.  LTM is good.  He does not enjoy doing brain games.  He likes going to [employer]. repeats oneself?  Endorsed, especially with appointments. Disoriented when walking into a room? Denies ***  Leaving objects?  May misplace things but not in unusual places***  Wandering behavior?  denies   Any personality changes since last visit?  Denies.   Any worsening depression?:  Denies.   Hallucinations or paranoia?  Denies.   Seizures? denies    Any sleep changes?  Sleeps well.  Denies vivid dreams, REM behavior or sleepwalking   Sleep apnea?   Denies.   Any hygiene concerns? Denies.  Independent of bathing and dressing?  Endorsed  Does the patient needs help with medications?  Patient is in charge, his  daughter monitors*** Who is in charge of the finances?  Patient is in charge, daughter monitors   *** Any changes in appetite?  Denies.  He continues to drive to [employer] where he eats the same meals, sometimes he goes to [employer], and to the pharmacy.***   Patient have trouble swallowing? Denies.   Does the patient cook? No Any headaches?   denies   Any vision changes?  He continues to receive injections to the left eye  chronic back pain  denies   Ambulates with difficulty? Denies.  *** Recent falls or head injuries? Denies.     Unilateral weakness, numbness or tingling? denies   Any tremors?  Denies  *** Any anosmia?  Denies   Any incontinence of urine?  Endorsed***  Any bowel dysfunction?   Denies      Daughter lives with him.  He has great family support*** Does the patient drive?  Very short distances, always the same places, no issues are reported***   History on Initial Assessment 05/25/2020: This is a pleasant 89 year old right-handed man with a history of hypertension, hyperlipidemia, diabetes, atrial fibrillation on Eliquis , stroke, presenting for evaluation of vascular dementia. He is accompanied by his daughter [name] who helps supplement the history today. He feels his memory is pretty good. [name] started noticing short-term memory changes around 2-3 years ago, but feels that he is overall doing well independently.  He lives alone. He has a good family support system with several family members locally checking on his regularly. His wife passed away 5 years ago and he was under a lot of stress. There have been several deaths in the family and he has been through a lot. He denies getting lost driving, [name] denies any driving concerns. He manages his own medications and finances, he and [name] deny any issues with these. He is part of the [employer] at church and manages things well.  He denies misplacing things frequently, [name] reports he rarely misplaces  things but gets really hard on himself when he loses things. He denies any word-finding difficulties. He does not cook much and denies leaving the stove on. He is independent with dressing and bathing, [name] denies any hygiene concerns. His brother is his POA. No family history of dementia. He denies any significant head injuries or alcohol use. Notes reviewed, he was started on Namenda  by Dr. Mavis in 2015. MMSE 26/30 in 2017, 21/30 in 2019.  He denies any headaches, dizziness, diplopia, dysarthria, dysphagia, neck pain, focal numbness/tingling/weakness, bowel/bladder dysfunction. No anosmia, tremors, no falls.  He walks a mile a day. He has some back pain today, right leg feels different. Sleep is good. No personality changes, paranoia or hallucinations. He denies any known prior history of stroke. [name] reports that he passed out while at [place], and this is when he had a stroke. EPIC notes reviewed, he had a syncopal episode in 2016 due to bradycardia. CT head at that time did not show any evidence of stroke. There was diffuse atrophy and chronic microvascular disease. He had an MRI brain with and without contrast in 11/2017 for memory loss, confusion. There was note of a remote right temporal lobe infarct with encephalomalacia that was not seen in 2016 imaging.  I personally reviewed MRI brain without contrast done 12/2019 which did not show any acute changes. There was a large remote right temporal infarct, inferior division MCA territory, diffuse atrophy and mild chronic microvascular disease. There were remote hemorrhages along the right temporal occipital convexity without generalized chronic lobar hemorrhage, possible mild gliosis in the parasagittal or parietal lobes adjacent to the inferior falx.   MRI of the brain May 2024 without acute findings, chronic right temporal lobe infarct cerebral atrophy, mild chronic ischemic changes, remote hemorrhages, without progression.   PREVIOUS MEDICATIONS:  Donepezil (bradycardia) PREVIOUS MEDICATIONS:   CURRENT MEDICATIONS:  Outpatient Encounter Medications as of 01/15/2024  Medication Sig   apixaban  (ELIQUIS ) 5 MG TABS tablet TAKE 1 TABLET BY MOUTH TWICE A DAY   cyanocobalamin  1000 MCG tablet Take 1,000 mcg by mouth 2 (two) times daily.   escitalopram  (LEXAPRO ) 5 MG tablet Take 1 tablet (5 mg total) by mouth daily.   latanoprost  (XALATAN ) 0.005 % ophthalmic solution Place 1 drop into both eyes at bedtime.    memantine  (NAMENDA  XR) 28 MG CP24 24 hr capsule Take 1 capsule (28 mg total) by mouth daily.   metFORMIN  (GLUCOPHAGE -XR) 500 MG 24 hr tablet TAKE 1 TABLET BY MOUTH EVERY DAY WITH BREAKFAST   metoprolol  succinate (TOPROL -XL) 25 MG 24 hr tablet TAKE 1/2 TABLET BY MOUTH DAILY   rosuvastatin  (CRESTOR ) 20 MG tablet Take 1 tablet (20 mg total) by mouth 2 (two) times a week.   valsartan  (DIOVAN ) 80 MG tablet Take 1 tablet (80 mg total) by mouth daily.   No facility-administered encounter medications on file as of 01/15/2024.       07/31/2023  9:00 AM 02/06/2023   12:00 PM 02/14/2022   12:00 PM  MMSE - Mini Mental State Exam  Orientation to time 1 1 1   Orientation to Place 5 3 5   Registration 3 3 3   Attention/ Calculation 4 5 3   Recall 0 0 0  Language- name 2 objects 2 2 2   Language- repeat 1 1 1   Language- follow 3 step command 3 3 3   Language- read & follow direction 1 1 1   Write a sentence 1 1 1   Copy design 0 0 0  Total score 21 20 20       05/25/2020   10:00 AM  Montreal Cognitive Assessment   Visuospatial/ Executive (0/5) 3  Naming (0/3) 2  Attention: Read list of digits (0/2) 2  Attention: Read list of letters (0/1) 1  Attention: Serial 7 subtraction starting at 100 (0/3) 2  Language: Repeat phrase (0/2) 2  Language : Fluency (0/1) 1  Abstraction (0/2) 2  Delayed Recall (0/5) 0  Orientation (0/6) 4  Total 19  Adjusted Score (based on education) 20    Objective:     PHYSICAL EXAMINATION:    VITALS:  There were  no vitals filed for this visit.  GEN:  The patient appears stated age and is in NAD. HEENT:  Normocephalic, atraumatic.   Neurological examination:  General: NAD, well-groomed, appears stated age. Orientation: The patient is alert. Oriented to person, not place and date Cranial nerves: There is good facial symmetry.The speech is fluent and clear. No aphasia or dysarthria. Fund of knowledge is appropriate. Recent and remote memory are impaired. Attention and concentration are reduced. Able to name objects and repeat phrases.  Hearing is intact to conversational tone. *** Sensation: Sensation is intact to light touch throughout Motor: Strength is at least antigravity x4. DTR's 2/4 in UE/LE     Movement examination: Tone: There is normal tone in the UE/LE Abnormal movements:  no tremor.  No myoclonus.  No asterixis.   Coordination:  There is some decremation with RAM's. Normal finger to nose  Gait and Station: The patient has no*** difficulty arising out of a deep-seated chair without the use of the hands. The patient's stride length is good.  Gait is cautious and narrow.    Thank you for allowing us  the opportunity to participate in the care of this nice patient. Please do not hesitate to contact us  for any questions or concerns.   Total time spent on today's visit was *** minutes dedicated to this patient today, preparing to see patient, examining the patient, ordering tests and/or medications and counseling the patient, documenting clinical information in the EHR or other health record, independently interpreting results and communicating results to the patient/family, discussing treatment and goals, answering patient's questions and coordinating care.  Cc:  Katrinka Garnette KIDD, MD  Camie Sevin 01/15/2024 5:52 AM

## 2024-01-16 ENCOUNTER — Emergency Department (HOSPITAL_BASED_OUTPATIENT_CLINIC_OR_DEPARTMENT_OTHER)
Admission: EM | Admit: 2024-01-16 | Discharge: 2024-01-16 | Disposition: A | Discharge status: Home / Self Care | Source: Home / Self Care | Attending: Emergency Medicine | Admitting: Emergency Medicine

## 2024-01-16 ENCOUNTER — Other Ambulatory Visit: Payer: Self-pay

## 2024-01-16 DIAGNOSIS — Z85828 Personal history of other malignant neoplasm of skin: Secondary | ICD-10-CM | POA: Insufficient documentation

## 2024-01-16 DIAGNOSIS — S199XXA Unspecified injury of neck, initial encounter: Secondary | ICD-10-CM | POA: Diagnosis not present

## 2024-01-16 DIAGNOSIS — R58 Hemorrhage, not elsewhere classified: Secondary | ICD-10-CM

## 2024-01-16 DIAGNOSIS — L7622 Postprocedural hemorrhage and hematoma of skin and subcutaneous tissue following other procedure: Secondary | ICD-10-CM | POA: Diagnosis not present

## 2024-01-16 DIAGNOSIS — I10 Essential (primary) hypertension: Secondary | ICD-10-CM | POA: Diagnosis not present

## 2024-01-16 DIAGNOSIS — S3993XA Unspecified injury of pelvis, initial encounter: Secondary | ICD-10-CM | POA: Diagnosis not present

## 2024-01-16 DIAGNOSIS — Z7901 Long term (current) use of anticoagulants: Secondary | ICD-10-CM | POA: Insufficient documentation

## 2024-01-16 DIAGNOSIS — S0990XA Unspecified injury of head, initial encounter: Secondary | ICD-10-CM | POA: Diagnosis not present

## 2024-01-16 DIAGNOSIS — M160 Bilateral primary osteoarthritis of hip: Secondary | ICD-10-CM | POA: Diagnosis not present

## 2024-01-16 DIAGNOSIS — S299XXA Unspecified injury of thorax, initial encounter: Secondary | ICD-10-CM | POA: Diagnosis not present

## 2024-01-16 DIAGNOSIS — I609 Nontraumatic subarachnoid hemorrhage, unspecified: Secondary | ICD-10-CM | POA: Diagnosis not present

## 2024-01-16 DIAGNOSIS — L7621 Postprocedural hemorrhage and hematoma of skin and subcutaneous tissue following a dermatologic procedure: Secondary | ICD-10-CM | POA: Insufficient documentation

## 2024-01-16 DIAGNOSIS — S066X0A Traumatic subarachnoid hemorrhage without loss of consciousness, initial encounter: Secondary | ICD-10-CM | POA: Diagnosis not present

## 2024-01-16 DIAGNOSIS — I611 Nontraumatic intracerebral hemorrhage in hemisphere, cortical: Secondary | ICD-10-CM | POA: Diagnosis not present

## 2024-01-16 DIAGNOSIS — C61 Malignant neoplasm of prostate: Secondary | ICD-10-CM | POA: Diagnosis not present

## 2024-01-16 DIAGNOSIS — G9389 Other specified disorders of brain: Secondary | ICD-10-CM | POA: Diagnosis not present

## 2024-01-16 DIAGNOSIS — M47816 Spondylosis without myelopathy or radiculopathy, lumbar region: Secondary | ICD-10-CM | POA: Diagnosis not present

## 2024-01-16 LAB — CBC WITH DIFFERENTIAL/PLATELET
Abs Immature Granulocytes: 0.03 K/uL (ref 0.00–0.07)
Basophils Absolute: 0 K/uL (ref 0.0–0.1)
Basophils Relative: 1 %
Eosinophils Absolute: 0.2 K/uL (ref 0.0–0.5)
Eosinophils Relative: 3 %
HCT: 39.9 % (ref 39.0–52.0)
Hemoglobin: 14.1 g/dL (ref 13.0–17.0)
Immature Granulocytes: 1 %
Lymphocytes Relative: 26 %
Lymphs Abs: 1.5 K/uL (ref 0.7–4.0)
MCH: 32 pg (ref 26.0–34.0)
MCHC: 35.3 g/dL (ref 30.0–36.0)
MCV: 90.7 fL (ref 80.0–100.0)
Monocytes Absolute: 0.4 K/uL (ref 0.1–1.0)
Monocytes Relative: 8 %
Neutro Abs: 3.5 K/uL (ref 1.7–7.7)
Neutrophils Relative %: 61 %
Platelets: 145 K/uL — ABNORMAL LOW (ref 150–400)
RBC: 4.4 MIL/uL (ref 4.22–5.81)
RDW: 13 % (ref 11.5–15.5)
WBC: 5.6 K/uL (ref 4.0–10.5)
nRBC: 0 % (ref 0.0–0.2)

## 2024-01-16 MED ORDER — TRANEXAMIC ACID 1000 MG/10ML IV SOLN
500.0000 mg | Freq: Once | INTRAVENOUS | Status: AC
  Administered 2024-01-16: 500 mg via TOPICAL
  Filled 2024-01-16: qty 10

## 2024-01-16 NOTE — ED Notes (Signed)
 Pt has dressing on place and blood appears to be clotted around dressing. Bleeding controlled at this time.

## 2024-01-16 NOTE — ED Notes (Signed)
 Small amount of blood oozing from lip area; additional tranexamic acid  applied to Q-tip and placed on area

## 2024-01-16 NOTE — ED Notes (Signed)
 Nose clip removed from wound; no visible bleeding at this time.  Will continue to monitor

## 2024-01-16 NOTE — ED Notes (Signed)
 2 small areas along suture line still oozing; wound seal powder applied by provider; pressure held for 15 minutes.  One area still oozing after pressure released; additional wound seal powder applied and clip (padded nasal clip) applied for additional 15 minutes.

## 2024-01-16 NOTE — ED Triage Notes (Signed)
 Pt arrives via POV following left side facial skin cancer removal yesterday. Presents to the ED d/t concern for ongoing bleeding that worsened around 1 am.

## 2024-01-16 NOTE — Discharge Instructions (Addendum)
 Calvin Burnett was seen in the emergency department for bleeding from his site of excision by dermatology yesterday We applied several different medications and pressure to get the bleeding to stop Leave the dressing in place and do not touch it for the next 24 hours. If the bleeding starts again at home apply firm direct pressure for at least 15 minutes and then look to see if the bleeding stopped If this does not work after 2 times of direct pressure return to the emergency department Try not to touch or disrupt any of the sutured sites Follow-up with his primary care doctor early next week for reevaluation

## 2024-01-16 NOTE — ED Provider Notes (Signed)
 [facility] EM[facility]GENC[facility] DEPARTMENT AT [facility] Provider Note   CSN: [id number] Arrival date & time: 01/16/24  0449     Patient presents with: Post-op Problem   Calvin Burnett is a 89 y.o. male.with a history of basal cell CA who presents to the ED for post-op bleeding. Pt underwent basal cell removal over the R face yesterday with [facility] dermatology. Around 0100 this morning he began bleeding from the sutured site and his daughter has had difficulty controlling the bleeding. He is on Eliquis . Last dose last night   HPI     Prior to Admission medications   Medication Sig Start Date End Date Taking? Authorizing Provider  apixaban  (ELIQUIS ) 5 MG TABS tablet TAKE 1 TABLET BY MOUTH TWICE A DAY 12/12/23   Camnitz, Will Gladis, MD  cyanocobalamin  1000 MCG tablet Take 1,000 mcg by mouth 2 (two) times daily.    [provider]  escitalopram  (LEXAPRO ) 5 MG tablet Take 1 tablet (5 mg total) by mouth daily. 07/31/23   Wertman, Sara E, PA-C  latanoprost  (XALATAN ) 0.005 % ophthalmic solution Place 1 drop into both eyes at bedtime.  08/05/15   [provider]  memantine  (NAMENDA  XR) 28 MG CP24 24 hr capsule Take 1 capsule (28 mg total) by mouth daily. 09/29/23   Wertman, Sara E, PA-C  metFORMIN  (GLUCOPHAGE -XR) 500 MG 24 hr tablet TAKE 1 TABLET BY MOUTH EVERY DAY WITH BREAKFAST 06/10/23   Katrinka Garnette KIDD, MD  metoprolol  succinate (TOPROL -XL) 25 MG 24 hr tablet TAKE 1/2 TABLET BY MOUTH DAILY 11/10/23   Katrinka Garnette KIDD, MD  rosuvastatin  (CRESTOR ) 20 MG tablet Take 1 tablet (20 mg total) by mouth 2 (two) times a week. 11/17/23   Katrinka Garnette KIDD, MD  valsartan  (DIOVAN ) 80 MG tablet Take 1 tablet (80 mg total) by mouth daily. 11/12/23   Katrinka Garnette KIDD, MD    Allergies: Kcentra  [prothrombin  complex conc human]    Review of Systems  Updated Vital Signs BP (!) 167/90   Pulse (!) 54   Temp 97.7 F (36.5 C)   Resp 16   SpO2 98%   Physical Exam Vitals and nursing note  reviewed.  HENT:     Head: Normocephalic and atraumatic.  Eyes:     Pupils: Pupils are equal, round, and reactive to light.  Cardiovascular:     Rate and Rhythm: Normal rate and regular rhythm.  Pulmonary:     Effort: Pulmonary effort is normal.     Breath sounds: Normal breath sounds.  Abdominal:     Palpations: Abdomen is soft.     Tenderness: There is no abdominal tenderness.  Skin:    General: Skin is warm and dry.     Comments: Postoperative sutures in place over right infraorbital region extending posteriorly through the vermilion border Slightly oozing from several of the sites No intraoral bleeding No epistaxis  Neurological:     Mental Status: He is alert.  Psychiatric:        Mood and Affect: Mood normal.     (all labs ordered are listed, but only abnormal results are displayed) Labs Reviewed  CBC WITH DIFFERENTIAL/PLATELET - Abnormal; Notable for the following components:      Result Value   Platelets 145 (*)    All other components within normal limits    EKG: None  Radiology: No results found.   Procedures   Medications Ordered in the ED  tranexamic acid  (CYKLOKAPRON ) injection 500 mg (500 mg Topical Given  01/16/24 0739)    Clinical Course as of 01/16/24 1047  Fri Jan 16, 2024  1008 TXA soaked gauze and pressure dressing did not work to achieve hemostasis.  Applied wound seal and direct pressure which appears to have achieved hemostasis.  Will continue to monitor for a little while here to make sure the bleeding does not restart [MP]  1046 No residual bleeding.  Will discharge instruction for PCP/dermatology follow-up [MP]    Clinical Course User Index [MP] [name] LABOR, DO                                 Medical Decision Making 89 year old male with history as above presenting to the ED for postoperative bleeding after excision of basal cell carcinoma by dermatology yesterday.  He is on Eliquis  last dose was last night.  Slightly oozing.  Will  attempt to clean the wound with saline apply TXA soaked gauze and create pressure dressing and reevaluate to achieve hemostasis  Risk Prescription drug management.        Final diagnoses:  Bleeding    ED Discharge Orders     None          [name] LABOR, DO 01/16/24 1047

## 2024-01-16 NOTE — ED Notes (Signed)
 Tranexamic acid  soaked gauze applied to sutured wound; secured by additional gauze and paper tape.  Patient instructed to leave dressing until provider came in to reevaluate.

## 2024-01-17 ENCOUNTER — Other Ambulatory Visit: Payer: Self-pay

## 2024-01-17 ENCOUNTER — Emergency Department (HOSPITAL_COMMUNITY)

## 2024-01-17 ENCOUNTER — Encounter (HOSPITAL_COMMUNITY): Payer: Self-pay | Admitting: Pharmacy Technician

## 2024-01-17 ENCOUNTER — Inpatient Hospital Stay (HOSPITAL_COMMUNITY)
Admission: EM | Admit: 2024-01-17 | Discharge: 2024-01-22 | DRG: 064 | Disposition: A | Discharge status: Skilled Nursing Facility | Attending: Internal Medicine | Admitting: Internal Medicine

## 2024-01-17 DIAGNOSIS — R569 Unspecified convulsions: Secondary | ICD-10-CM | POA: Diagnosis not present

## 2024-01-17 DIAGNOSIS — S3993XA Unspecified injury of pelvis, initial encounter: Secondary | ICD-10-CM | POA: Diagnosis not present

## 2024-01-17 DIAGNOSIS — S199XXA Unspecified injury of neck, initial encounter: Secondary | ICD-10-CM | POA: Diagnosis not present

## 2024-01-17 DIAGNOSIS — S299XXA Unspecified injury of thorax, initial encounter: Secondary | ICD-10-CM | POA: Diagnosis not present

## 2024-01-17 DIAGNOSIS — F0153 Vascular dementia, unspecified severity, with mood disturbance: Secondary | ICD-10-CM | POA: Diagnosis present

## 2024-01-17 DIAGNOSIS — I350 Nonrheumatic aortic (valve) stenosis: Secondary | ICD-10-CM

## 2024-01-17 DIAGNOSIS — I619 Nontraumatic intracerebral hemorrhage, unspecified: Secondary | ICD-10-CM | POA: Insufficient documentation

## 2024-01-17 DIAGNOSIS — I4891 Unspecified atrial fibrillation: Secondary | ICD-10-CM | POA: Diagnosis present

## 2024-01-17 DIAGNOSIS — I611 Nontraumatic intracerebral hemorrhage in hemisphere, cortical: Secondary | ICD-10-CM | POA: Diagnosis not present

## 2024-01-17 DIAGNOSIS — Z85038 Personal history of other malignant neoplasm of large intestine: Secondary | ICD-10-CM

## 2024-01-17 DIAGNOSIS — I6523 Occlusion and stenosis of bilateral carotid arteries: Secondary | ICD-10-CM | POA: Diagnosis not present

## 2024-01-17 DIAGNOSIS — Z9049 Acquired absence of other specified parts of digestive tract: Secondary | ICD-10-CM

## 2024-01-17 DIAGNOSIS — G9341 Metabolic encephalopathy: Secondary | ICD-10-CM | POA: Diagnosis not present

## 2024-01-17 DIAGNOSIS — H409 Unspecified glaucoma: Secondary | ICD-10-CM | POA: Diagnosis present

## 2024-01-17 DIAGNOSIS — W19XXXA Unspecified fall, initial encounter: Principal | ICD-10-CM

## 2024-01-17 DIAGNOSIS — R7989 Other specified abnormal findings of blood chemistry: Secondary | ICD-10-CM | POA: Insufficient documentation

## 2024-01-17 DIAGNOSIS — R29701 NIHSS score 1: Secondary | ICD-10-CM | POA: Diagnosis present

## 2024-01-17 DIAGNOSIS — I080 Rheumatic disorders of both mitral and aortic valves: Secondary | ICD-10-CM | POA: Diagnosis present

## 2024-01-17 DIAGNOSIS — Z7984 Long term (current) use of oral hypoglycemic drugs: Secondary | ICD-10-CM

## 2024-01-17 DIAGNOSIS — M160 Bilateral primary osteoarthritis of hip: Secondary | ICD-10-CM | POA: Diagnosis not present

## 2024-01-17 DIAGNOSIS — S066X0A Traumatic subarachnoid hemorrhage without loss of consciousness, initial encounter: Secondary | ICD-10-CM | POA: Diagnosis not present

## 2024-01-17 DIAGNOSIS — I672 Cerebral atherosclerosis: Secondary | ICD-10-CM | POA: Diagnosis not present

## 2024-01-17 DIAGNOSIS — R55 Syncope and collapse: Secondary | ICD-10-CM | POA: Diagnosis not present

## 2024-01-17 DIAGNOSIS — E1122 Type 2 diabetes mellitus with diabetic chronic kidney disease: Secondary | ICD-10-CM | POA: Diagnosis present

## 2024-01-17 DIAGNOSIS — S06331A Contusion and laceration of cerebrum, unspecified, with loss of consciousness of 30 minutes or less, initial encounter: Secondary | ICD-10-CM | POA: Diagnosis not present

## 2024-01-17 DIAGNOSIS — E785 Hyperlipidemia, unspecified: Secondary | ICD-10-CM | POA: Diagnosis not present

## 2024-01-17 DIAGNOSIS — I680 Cerebral amyloid angiopathy: Secondary | ICD-10-CM | POA: Diagnosis present

## 2024-01-17 DIAGNOSIS — E119 Type 2 diabetes mellitus without complications: Secondary | ICD-10-CM

## 2024-01-17 DIAGNOSIS — Z7901 Long term (current) use of anticoagulants: Secondary | ICD-10-CM

## 2024-01-17 DIAGNOSIS — I4821 Permanent atrial fibrillation: Secondary | ICD-10-CM | POA: Diagnosis present

## 2024-01-17 DIAGNOSIS — Y838 Other surgical procedures as the cause of abnormal reaction of the patient, or of later complication, without mention of misadventure at the time of the procedure: Secondary | ICD-10-CM | POA: Diagnosis present

## 2024-01-17 DIAGNOSIS — T45515A Adverse effect of anticoagulants, initial encounter: Secondary | ICD-10-CM | POA: Diagnosis not present

## 2024-01-17 DIAGNOSIS — Z8673 Personal history of transient ischemic attack (TIA), and cerebral infarction without residual deficits: Secondary | ICD-10-CM

## 2024-01-17 DIAGNOSIS — E859 Amyloidosis, unspecified: Secondary | ICD-10-CM | POA: Diagnosis not present

## 2024-01-17 DIAGNOSIS — K219 Gastro-esophageal reflux disease without esophagitis: Secondary | ICD-10-CM | POA: Diagnosis present

## 2024-01-17 DIAGNOSIS — R001 Bradycardia, unspecified: Secondary | ICD-10-CM | POA: Diagnosis not present

## 2024-01-17 DIAGNOSIS — N1831 Chronic kidney disease, stage 3a: Secondary | ICD-10-CM | POA: Diagnosis present

## 2024-01-17 DIAGNOSIS — I10 Essential (primary) hypertension: Secondary | ICD-10-CM | POA: Diagnosis not present

## 2024-01-17 DIAGNOSIS — C059 Malignant neoplasm of palate, unspecified: Secondary | ICD-10-CM | POA: Diagnosis present

## 2024-01-17 DIAGNOSIS — I609 Nontraumatic subarachnoid hemorrhage, unspecified: Secondary | ICD-10-CM | POA: Diagnosis not present

## 2024-01-17 DIAGNOSIS — E854 Organ-limited amyloidosis: Secondary | ICD-10-CM | POA: Diagnosis present

## 2024-01-17 DIAGNOSIS — I2510 Atherosclerotic heart disease of native coronary artery without angina pectoris: Secondary | ICD-10-CM | POA: Diagnosis present

## 2024-01-17 DIAGNOSIS — L7621 Postprocedural hemorrhage and hematoma of skin and subcutaneous tissue following a dermatologic procedure: Secondary | ICD-10-CM | POA: Diagnosis present

## 2024-01-17 DIAGNOSIS — C61 Malignant neoplasm of prostate: Secondary | ICD-10-CM | POA: Diagnosis not present

## 2024-01-17 DIAGNOSIS — F0150 Vascular dementia without behavioral disturbance: Secondary | ICD-10-CM | POA: Diagnosis present

## 2024-01-17 DIAGNOSIS — M47816 Spondylosis without myelopathy or radiculopathy, lumbar region: Secondary | ICD-10-CM | POA: Diagnosis not present

## 2024-01-17 DIAGNOSIS — G9389 Other specified disorders of brain: Secondary | ICD-10-CM | POA: Diagnosis not present

## 2024-01-17 DIAGNOSIS — I129 Hypertensive chronic kidney disease with stage 1 through stage 4 chronic kidney disease, or unspecified chronic kidney disease: Secondary | ICD-10-CM | POA: Diagnosis present

## 2024-01-17 DIAGNOSIS — Z79899 Other long term (current) drug therapy: Secondary | ICD-10-CM

## 2024-01-17 DIAGNOSIS — R4182 Altered mental status, unspecified: Secondary | ICD-10-CM | POA: Diagnosis not present

## 2024-01-17 DIAGNOSIS — I5A Non-ischemic myocardial injury (non-traumatic): Secondary | ICD-10-CM | POA: Diagnosis present

## 2024-01-17 DIAGNOSIS — I629 Nontraumatic intracranial hemorrhage, unspecified: Secondary | ICD-10-CM | POA: Diagnosis not present

## 2024-01-17 DIAGNOSIS — R609 Edema, unspecified: Secondary | ICD-10-CM | POA: Diagnosis not present

## 2024-01-17 DIAGNOSIS — F0390 Unspecified dementia without behavioral disturbance: Secondary | ICD-10-CM | POA: Diagnosis not present

## 2024-01-17 DIAGNOSIS — I618 Other nontraumatic intracerebral hemorrhage: Secondary | ICD-10-CM

## 2024-01-17 DIAGNOSIS — S0990XA Unspecified injury of head, initial encounter: Secondary | ICD-10-CM | POA: Diagnosis not present

## 2024-01-17 DIAGNOSIS — Z87891 Personal history of nicotine dependence: Secondary | ICD-10-CM

## 2024-01-17 DIAGNOSIS — E876 Hypokalemia: Secondary | ICD-10-CM

## 2024-01-17 LAB — COMPREHENSIVE METABOLIC PANEL WITH GFR
ALT: 22 U/L (ref 0–44)
AST: 30 U/L (ref 15–41)
Albumin: 3.1 g/dL — ABNORMAL LOW (ref 3.5–5.0)
Alkaline Phosphatase: 86 U/L (ref 38–126)
Anion gap: 9 (ref 5–15)
BUN: 16 mg/dL (ref 8–23)
CO2: 28 mmol/L (ref 22–32)
Calcium: 8.8 mg/dL — ABNORMAL LOW (ref 8.9–10.3)
Chloride: 105 mmol/L (ref 98–111)
Creatinine, Ser: 1.41 mg/dL — ABNORMAL HIGH (ref 0.61–1.24)
GFR, Estimated: 48 mL/min — ABNORMAL LOW (ref >60)
Glucose, Bld: 109 mg/dL — ABNORMAL HIGH (ref 70–99)
Potassium: 3.5 mmol/L (ref 3.5–5.1)
Sodium: 142 mmol/L (ref 135–145)
Total Bilirubin: 0.6 mg/dL (ref 0.0–1.2)
Total Protein: 6.8 g/dL (ref 6.5–8.1)

## 2024-01-17 LAB — SAMPLE TO BLOOD BANK

## 2024-01-17 LAB — CBC
HCT: 39.2 % (ref 39.0–52.0)
Hemoglobin: 14.2 g/dL (ref 13.0–17.0)
MCH: 33.1 pg (ref 26.0–34.0)
MCHC: 36.2 g/dL — ABNORMAL HIGH (ref 30.0–36.0)
MCV: 91.4 fL (ref 80.0–100.0)
Platelets: 152 K/uL (ref 150–400)
RBC: 4.29 MIL/uL (ref 4.22–5.81)
RDW: 13.2 % (ref 11.5–15.5)
WBC: 6.4 K/uL (ref 4.0–10.5)
nRBC: 0 % (ref 0.0–0.2)

## 2024-01-17 LAB — I-STAT CHEM 8, ED
BUN: 18 mg/dL (ref 8–23)
Calcium, Ion: 1.08 mmol/L — ABNORMAL LOW (ref 1.15–1.40)
Chloride: 104 mmol/L (ref 98–111)
Creatinine, Ser: 1.4 mg/dL — ABNORMAL HIGH (ref 0.61–1.24)
Glucose, Bld: 104 mg/dL — ABNORMAL HIGH (ref 70–99)
HCT: 40 % (ref 39.0–52.0)
Hemoglobin: 13.6 g/dL (ref 13.0–17.0)
Potassium: 3.5 mmol/L (ref 3.5–5.1)
Sodium: 144 mmol/L (ref 135–145)
TCO2: 26 mmol/L (ref 22–32)

## 2024-01-17 LAB — ETHANOL: Alcohol, Ethyl (B): <15 mg/dL (ref <15)

## 2024-01-17 LAB — PROTIME-INR
INR: 1.4 — ABNORMAL HIGH (ref 0.8–1.2)
Prothrombin Time: 18 s — ABNORMAL HIGH (ref 11.4–15.2)

## 2024-01-17 LAB — I-STAT CG4 LACTIC ACID, ED: Lactic Acid, Venous: 1.4 mmol/L (ref 0.5–1.9)

## 2024-01-17 LAB — TROPONIN I (HIGH SENSITIVITY)
Troponin I (High Sensitivity): 24 ng/L — ABNORMAL HIGH (ref <18)
Troponin I (High Sensitivity): 22 ng/L — ABNORMAL HIGH (ref <18)

## 2024-01-17 NOTE — Progress Notes (Signed)
 Orthopedic Tech Progress Note Patient Details:  FYNN VANBLARCOM Nov 04, 1933 [id number]  Level 2 trauma, no ortho tech orders at this time.  Patient ID: CARMINO OCAIN, male   DOB: 10-03-1933, 89 y.o.   MRN: [id number]  Tinnie Ronal Brasil 01/17/2024, 6:36 PM

## 2024-01-17 NOTE — ED Provider Notes (Signed)
 [place] [facility] AT [facility] Provider Note   CSN: [id number] Arrival date & time: 01/17/24  1708     Patient presents with: No chief complaint on file.   Calvin Burnett is a 89 y.o. male.   HPI Patient is daughter was assisting him to go to the car to come to the hospital for evaluation.  He had recently some increased confusion from baseline.  While assisting him he lost balance and fell backwards hitting his head.  Patient is anticoagulated on Eliquis .  Patient had a recent basal cell carcinoma excision on his upper lip.  He does have a lot of swelling and discoloration of the upper lip but this is a postoperative condition and not due to his fall.  He has not had any recent fevers or chills.  He has not had any vomiting or diarrhea.  He is otherwise been eating his usual.  Patient's daughter reports she has been living with him and assisting due to some worsening dementia over the past year.    Prior to Admission medications   Medication Sig Start Date End Date Taking? Authorizing Provider  apixaban  (ELIQUIS ) 5 MG TABS tablet TAKE 1 TABLET BY MOUTH TWICE A DAY 12/12/23   Camnitz, Soyla Lunger, MD  cyanocobalamin  1000 MCG tablet Take 1,000 mcg by mouth 2 (two) times daily.    [provider]  escitalopram  (LEXAPRO ) 5 MG tablet Take 1 tablet (5 mg total) by mouth daily. 07/31/23   Wertman, Sara E, PA-C  latanoprost  (XALATAN ) 0.005 % ophthalmic solution Place 1 drop into both eyes at bedtime.  08/05/15   [provider]  memantine  (NAMENDA  XR) 28 MG CP24 24 hr capsule Take 1 capsule (28 mg total) by mouth daily. 09/29/23   Wertman, Sara E, PA-C  metFORMIN  (GLUCOPHAGE -XR) 500 MG 24 hr tablet TAKE 1 TABLET BY MOUTH EVERY DAY WITH BREAKFAST 06/10/23   Katrinka Garnette KIDD, MD  metoprolol  succinate (TOPROL -XL) 25 MG 24 hr tablet TAKE 1/2 TABLET BY MOUTH DAILY 11/10/23   Katrinka Garnette KIDD, MD  rosuvastatin  (CRESTOR ) 20 MG tablet Take 1 tablet (20 mg total)  by mouth 2 (two) times a week. 11/17/23   Katrinka Garnette KIDD, MD  valsartan  (DIOVAN ) 80 MG tablet Take 1 tablet (80 mg total) by mouth daily. 11/12/23   Katrinka Garnette KIDD, MD    Allergies: Kcentra  [prothrombin  complex conc human]    Review of Systems  Updated Vital Signs BP (!) 164/94   Pulse (!) 39   Temp 98.1 F (36.7 C) (Oral)   Resp 19   Ht 5' 10 (1.778 m)   Wt 70.3 kg   SpO2 99%   BMI 22.24 kg/m   Physical Exam Constitutional:      Comments: Patient is alert.  Answering questions.  He is mildly confused about timing.  No respiratory distress.  HENT:     Head: Normocephalic and atraumatic.     Mouth/Throat:     Pharynx: Oropharynx is clear.     Comments: Does have large swelling of the upper lip.  Ecchymosis.  No active bleeding.  This is from prior surgical excision. Eyes:     Extraocular Movements: Extraocular movements intact.  Cardiovascular:     Rate and Rhythm: Regular rhythm. Bradycardia present.  Pulmonary:     Effort: Pulmonary effort is normal.     Breath sounds: Normal breath sounds.  Abdominal:     General: There is no distension.     Palpations:  Abdomen is soft.     Tenderness: There is no abdominal tenderness. There is no guarding.  Musculoskeletal:        General: No deformity or signs of injury. Normal range of motion.  Skin:    General: Skin is warm and dry.  Neurological:     Comments: Patient has history of dementia.  He can answer some questions appropriately.  No focal neurologic deficits but patient not ambulated.  Patient's daughter reports at baseline with ambulation he has become very slow and unsteady.     (all labs ordered are listed, but only abnormal results are displayed) Labs Reviewed  COMPREHENSIVE METABOLIC PANEL WITH GFR - Abnormal; Notable for the following components:      Result Value   Glucose, Bld 109 (*)    Creatinine, Ser 1.41 (*)    Calcium  8.8 (*)    Albumin  3.1 (*)    GFR, Estimated 48 (*)    All other components  within normal limits  CBC - Abnormal; Notable for the following components:   MCHC 36.2 (*)    All other components within normal limits  PROTIME-INR - Abnormal; Notable for the following components:   Prothrombin  Time 18.0 (*)    INR 1.4 (*)    All other components within normal limits  I-STAT CHEM 8, ED - Abnormal; Notable for the following components:   Creatinine, Ser 1.40 (*)    Glucose, Bld 104 (*)    Calcium , Ion 1.08 (*)    All other components within normal limits  TROPONIN I (HIGH SENSITIVITY) - Abnormal; Notable for the following components:   Troponin I (High Sensitivity) 24 (*)    All other components within normal limits  TROPONIN I (HIGH SENSITIVITY) - Abnormal; Notable for the following components:   Troponin I (High Sensitivity) 22 (*)    All other components within normal limits  ETHANOL  URINALYSIS, ROUTINE W REFLEX MICROSCOPIC  I-STAT CG4 LACTIC ACID, ED  SAMPLE TO BLOOD BANK    EKG: EKG Interpretation Date/Time:  Saturday January 17 2024 17:29:02 EDT Ventricular Rate:  52 PR Interval:    QRS Duration:  97 QT Interval:  466 QTC Calculation: 434 R Axis:   0  Text Interpretation: Atrial fibrillation Anteroseptal infarct, age indeterminate Confirmed by Kommor, [place] ([id number]) on 01/18/2024 12:07:19 AM  Radiology: CT HEAD WO CONTRAST Addendum Date: 01/17/2024 ADDENDUM REPORT: 01/17/2024 18:20 ADDENDUM: These results were called by telephone at the time of interpretation on 01/17/2024 at 6:20 pm to provider [facility] , who verbally acknowledged these results. Electronically Signed   By: Morgane  Naveau M.D.   On: 01/17/2024 18:20   Result Date: 01/17/2024 CLINICAL DATA:  Head trauma, moderate-severe; Polytrauma, blunt. History of prostate cancer. EXAM: CT HEAD WITHOUT CONTRAST CT CERVICAL SPINE WITHOUT CONTRAST TECHNIQUE: Multidetector CT imaging of the head and cervical spine was performed following the standard protocol without intravenous contrast. Multiplanar CT  image reconstructions of the cervical spine were also generated. RADIATION DOSE REDUCTION: This exam was performed according to the departmental dose-optimization program which includes automated exposure control, adjustment of the mA and/or kV according to patient size and/or use of iterative reconstruction technique. COMPARISON:  CT head 11/14/2022 FINDINGS: CT HEAD FINDINGS Brain: Patchy and confluent areas of decreased attenuation are noted throughout the deep and periventricular white matter of the cerebral hemispheres bilaterally, compatible with chronic microvascular ischemic disease. 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 No evidence of large-territorial acute infarction. Several foci of hyperdensity along  the left occipital and temporal lobes best evaluated on sagittal and coronal view (5:38, 4:56) with these measuring 4-5 mm each. No mass lesion. No extra-axial collection. No mass effect or midline shift. No hydrocephalus. Basilar cisterns are patent. Vascular: No hyperdense vessel. Atherosclerotic calcifications are present within the cavernous internal carotid arteries. Skull: No acute fracture or focal lesion. Sinuses/Orbits: Paranasal sinuses and mastoid air cells are clear. The orbits are unremarkable. Other: None. CT CERVICAL SPINE FINDINGS Alignment: Normal. Skull base and vertebrae: Multilevel moderate degenerative changes of the spine. Associated severe left C4-C5 osseous neural foramina. No associated severe osseous central canal stenosis. No acute fracture. No aggressive appearing focal osseous lesion or focal pathologic process. Soft tissues and spinal canal: No prevertebral fluid or swelling. No visible canal hematoma. Upper chest: Unremarkable. Other: Atherosclerotic plaque of the carotid arteries within the neck. IMPRESSION: 1. Total of four 4-5 mm hyperdense foci along the left frontal, temporal, occipital lobes. Findings could represent intraparenchymal foci of  hemorrhage versus underlying metastases. Recommend follow-up CT head in 4-6 hours. Consider MRI head with and without contrast for evaluation of underlying masses if clinically indicated. 2. No acute displaced fracture or traumatic listhesis of the cervical spine. 3. Severe left C4-C5 osseous neural foramina. Electronically Signed: By: Morgane  Naveau M.D. On: 01/17/2024 18:17   CT CERVICAL SPINE WO CONTRAST Addendum Date: 01/17/2024 ADDENDUM REPORT: 01/17/2024 18:20 ADDENDUM: These results were called by telephone at the time of interpretation on 01/17/2024 at 6:20 pm to provider [facility] , who verbally acknowledged these results. Electronically Signed   By: Morgane  Naveau M.D.   On: 01/17/2024 18:20   Result Date: 01/17/2024 CLINICAL DATA:  Head trauma, moderate-severe; Polytrauma, blunt. History of prostate cancer. EXAM: CT HEAD WITHOUT CONTRAST CT CERVICAL SPINE WITHOUT CONTRAST TECHNIQUE: Multidetector CT imaging of the head and cervical spine was performed following the standard protocol without intravenous contrast. Multiplanar CT image reconstructions of the cervical spine were also generated. RADIATION DOSE REDUCTION: This exam was performed according to the departmental dose-optimization program which includes automated exposure control, adjustment of the mA and/or kV according to patient size and/or use of iterative reconstruction technique. COMPARISON:  CT head 11/14/2022 FINDINGS: CT HEAD FINDINGS Brain: Patchy and confluent areas of decreased attenuation are noted throughout the deep and periventricular white matter of the cerebral hemispheres bilaterally, compatible with chronic microvascular ischemic disease. 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 No evidence of large-territorial acute infarction. Several foci of hyperdensity along the left occipital and temporal lobes best evaluated on sagittal and coronal view (5:38, 4:56) with these measuring 4-5 mm each. No mass  lesion. No extra-axial collection. No mass effect or midline shift. No hydrocephalus. Basilar cisterns are patent. Vascular: No hyperdense vessel. Atherosclerotic calcifications are present within the cavernous internal carotid arteries. Skull: No acute fracture or focal lesion. Sinuses/Orbits: Paranasal sinuses and mastoid air cells are clear. The orbits are unremarkable. Other: None. CT CERVICAL SPINE FINDINGS Alignment: Normal. Skull base and vertebrae: Multilevel moderate degenerative changes of the spine. Associated severe left C4-C5 osseous neural foramina. No associated severe osseous central canal stenosis. No acute fracture. No aggressive appearing focal osseous lesion or focal pathologic process. Soft tissues and spinal canal: No prevertebral fluid or swelling. No visible canal hematoma. Upper chest: Unremarkable. Other: Atherosclerotic plaque of the carotid arteries within the neck. IMPRESSION: 1. Total of four 4-5 mm hyperdense foci along the left frontal, temporal, occipital lobes. Findings could represent intraparenchymal foci of hemorrhage versus underlying metastases. Recommend  follow-up CT head in 4-6 hours. Consider MRI head with and without contrast for evaluation of underlying masses if clinically indicated. 2. No acute displaced fracture or traumatic listhesis of the cervical spine. 3. Severe left C4-C5 osseous neural foramina. Electronically Signed: By: Morgane  Naveau M.D. On: 01/17/2024 18:17   DG Pelvis Portable Result Date: 01/17/2024 CLINICAL DATA:  Trauma, fall. EXAM: PORTABLE PELVIS 1-2 VIEWS COMPARISON:  04/16/2019. FINDINGS: There is no evidence of acute fracture or dislocation. Mild degenerative changes are noted at the hips bilaterally. There are degenerative changes in lower lumbar spine. Radiation therapy seeds are noted in the region of the prostate gland. IMPRESSION: No acute fracture or dislocation. Electronically Signed   By: Leita Birmingham M.D.   On: 01/17/2024 17:32   DG  Chest Port 1 View Result Date: 01/17/2024 CLINICAL DATA:  Trauma.  Fall on blood thinners EXAM: PORTABLE CHEST 1 VIEW COMPARISON:  None Available. FINDINGS: Normal mediastinum and cardiac silhouette. Normal pulmonary vasculature. No evidence of effusion, infiltrate, or pneumothorax. No acute bony abnormality. IMPRESSION: No acute cardiopulmonary process. Electronically Signed   By: Jackquline Boxer M.D.   On: 01/17/2024 17:32     Procedures   Medications Ordered in the ED - No data to display                                  Medical Decision Making Amount and/or Complexity of Data Reviewed Labs: ordered. Radiology: ordered.  Risk Prescription drug management. Decision regarding hospitalization.   Patient presents outlined.  At bedside patient's daughter and son have given additional history.  He does have dementia with some worsening independent function over the past year.  Things have however gotten significantly worse over about a week.  In the process of trying to bring the hospital for evaluation patient fell and struck his head.  He is anticoagulated.  Will proceed with CT head.  CT head shows areas of density concerning for either hemorrhage or possible metastatic disease.  Recommendations for MRI.  I have reviewed with neurology Dr. Linnette  Recommends proceeding with MRI with and without contrast.  Dr. Delwyn to follow-up on MRI results for final disposition.    Final diagnoses:  Fall, initial encounter  Silent micro-hemorrhage of brain [facility])    ED Discharge Orders     None          Armenta Canning, MD 01/24/24 [phone]

## 2024-01-17 NOTE — ED Notes (Signed)
Pt transported to CT by this RN.

## 2024-01-17 NOTE — ED Triage Notes (Signed)
 Pt bib ems with reports of acting strange for the last day per daughter. Swelling to face from surgery Thursday. Seen yesterday because ongoing bleeding. Pt came outside to come to hospital had a syncopal episode and fell backwards. Pt on eliquis . GCS 13. HR 40's 160/100 99% RA

## 2024-01-18 ENCOUNTER — Emergency Department (HOSPITAL_COMMUNITY)

## 2024-01-18 ENCOUNTER — Inpatient Hospital Stay (HOSPITAL_COMMUNITY)

## 2024-01-18 ENCOUNTER — Observation Stay (HOSPITAL_COMMUNITY)

## 2024-01-18 DIAGNOSIS — I080 Rheumatic disorders of both mitral and aortic valves: Secondary | ICD-10-CM | POA: Diagnosis not present

## 2024-01-18 DIAGNOSIS — R001 Bradycardia, unspecified: Secondary | ICD-10-CM | POA: Diagnosis not present

## 2024-01-18 DIAGNOSIS — G319 Degenerative disease of nervous system, unspecified: Secondary | ICD-10-CM | POA: Diagnosis not present

## 2024-01-18 DIAGNOSIS — E1122 Type 2 diabetes mellitus with diabetic chronic kidney disease: Secondary | ICD-10-CM | POA: Diagnosis not present

## 2024-01-18 DIAGNOSIS — E859 Amyloidosis, unspecified: Secondary | ICD-10-CM

## 2024-01-18 DIAGNOSIS — I618 Other nontraumatic intracerebral hemorrhage: Secondary | ICD-10-CM | POA: Diagnosis not present

## 2024-01-18 DIAGNOSIS — I6930 Unspecified sequelae of cerebral infarction: Secondary | ICD-10-CM | POA: Diagnosis not present

## 2024-01-18 DIAGNOSIS — S0633AA Contusion and laceration of cerebrum, unspecified, with loss of consciousness status unknown, initial encounter: Secondary | ICD-10-CM | POA: Diagnosis not present

## 2024-01-18 DIAGNOSIS — I6782 Cerebral ischemia: Secondary | ICD-10-CM | POA: Diagnosis not present

## 2024-01-18 DIAGNOSIS — R4182 Altered mental status, unspecified: Secondary | ICD-10-CM

## 2024-01-18 DIAGNOSIS — R41841 Cognitive communication deficit: Secondary | ICD-10-CM | POA: Diagnosis not present

## 2024-01-18 DIAGNOSIS — Z8673 Personal history of transient ischemic attack (TIA), and cerebral infarction without residual deficits: Secondary | ICD-10-CM

## 2024-01-18 DIAGNOSIS — I609 Nontraumatic subarachnoid hemorrhage, unspecified: Secondary | ICD-10-CM | POA: Insufficient documentation

## 2024-01-18 DIAGNOSIS — M109 Gout, unspecified: Secondary | ICD-10-CM | POA: Diagnosis not present

## 2024-01-18 DIAGNOSIS — G9389 Other specified disorders of brain: Secondary | ICD-10-CM | POA: Diagnosis not present

## 2024-01-18 DIAGNOSIS — I482 Chronic atrial fibrillation, unspecified: Secondary | ICD-10-CM | POA: Diagnosis not present

## 2024-01-18 DIAGNOSIS — I4891 Unspecified atrial fibrillation: Secondary | ICD-10-CM

## 2024-01-18 DIAGNOSIS — R569 Unspecified convulsions: Secondary | ICD-10-CM | POA: Diagnosis not present

## 2024-01-18 DIAGNOSIS — I680 Cerebral amyloid angiopathy: Secondary | ICD-10-CM | POA: Diagnosis not present

## 2024-01-18 DIAGNOSIS — R7989 Other specified abnormal findings of blood chemistry: Secondary | ICD-10-CM | POA: Insufficient documentation

## 2024-01-18 DIAGNOSIS — E785 Hyperlipidemia, unspecified: Secondary | ICD-10-CM | POA: Diagnosis not present

## 2024-01-18 DIAGNOSIS — I2510 Atherosclerotic heart disease of native coronary artery without angina pectoris: Secondary | ICD-10-CM | POA: Diagnosis not present

## 2024-01-18 DIAGNOSIS — H409 Unspecified glaucoma: Secondary | ICD-10-CM | POA: Diagnosis not present

## 2024-01-18 DIAGNOSIS — E876 Hypokalemia: Secondary | ICD-10-CM

## 2024-01-18 DIAGNOSIS — F01518 Vascular dementia, unspecified severity, with other behavioral disturbance: Secondary | ICD-10-CM | POA: Diagnosis not present

## 2024-01-18 DIAGNOSIS — G9341 Metabolic encephalopathy: Secondary | ICD-10-CM

## 2024-01-18 DIAGNOSIS — C059 Malignant neoplasm of palate, unspecified: Secondary | ICD-10-CM | POA: Diagnosis not present

## 2024-01-18 DIAGNOSIS — L7621 Postprocedural hemorrhage and hematoma of skin and subcutaneous tissue following a dermatologic procedure: Secondary | ICD-10-CM | POA: Diagnosis not present

## 2024-01-18 DIAGNOSIS — R29701 NIHSS score 1: Secondary | ICD-10-CM | POA: Diagnosis not present

## 2024-01-18 DIAGNOSIS — S06331A Contusion and laceration of cerebrum, unspecified, with loss of consciousness of 30 minutes or less, initial encounter: Secondary | ICD-10-CM | POA: Diagnosis not present

## 2024-01-18 DIAGNOSIS — T45515A Adverse effect of anticoagulants, initial encounter: Secondary | ICD-10-CM | POA: Diagnosis not present

## 2024-01-18 DIAGNOSIS — F0153 Vascular dementia, unspecified severity, with mood disturbance: Secondary | ICD-10-CM | POA: Diagnosis not present

## 2024-01-18 DIAGNOSIS — Z7984 Long term (current) use of oral hypoglycemic drugs: Secondary | ICD-10-CM | POA: Diagnosis not present

## 2024-01-18 DIAGNOSIS — E538 Deficiency of other specified B group vitamins: Secondary | ICD-10-CM | POA: Diagnosis not present

## 2024-01-18 DIAGNOSIS — I5A Non-ischemic myocardial injury (non-traumatic): Secondary | ICD-10-CM | POA: Diagnosis not present

## 2024-01-18 DIAGNOSIS — I611 Nontraumatic intracerebral hemorrhage in hemisphere, cortical: Secondary | ICD-10-CM | POA: Diagnosis not present

## 2024-01-18 DIAGNOSIS — E854 Organ-limited amyloidosis: Secondary | ICD-10-CM | POA: Diagnosis not present

## 2024-01-18 DIAGNOSIS — N1831 Chronic kidney disease, stage 3a: Secondary | ICD-10-CM | POA: Diagnosis not present

## 2024-01-18 DIAGNOSIS — I4821 Permanent atrial fibrillation: Secondary | ICD-10-CM

## 2024-01-18 DIAGNOSIS — I10 Essential (primary) hypertension: Secondary | ICD-10-CM | POA: Diagnosis not present

## 2024-01-18 DIAGNOSIS — G9340 Encephalopathy, unspecified: Secondary | ICD-10-CM | POA: Diagnosis not present

## 2024-01-18 DIAGNOSIS — F325 Major depressive disorder, single episode, in full remission: Secondary | ICD-10-CM | POA: Diagnosis not present

## 2024-01-18 DIAGNOSIS — I350 Nonrheumatic aortic (valve) stenosis: Secondary | ICD-10-CM | POA: Diagnosis not present

## 2024-01-18 DIAGNOSIS — W19XXXD Unspecified fall, subsequent encounter: Secondary | ICD-10-CM | POA: Diagnosis not present

## 2024-01-18 DIAGNOSIS — K219 Gastro-esophageal reflux disease without esophagitis: Secondary | ICD-10-CM | POA: Diagnosis not present

## 2024-01-18 DIAGNOSIS — E119 Type 2 diabetes mellitus without complications: Secondary | ICD-10-CM | POA: Diagnosis not present

## 2024-01-18 DIAGNOSIS — Z7901 Long term (current) use of anticoagulants: Secondary | ICD-10-CM | POA: Diagnosis not present

## 2024-01-18 DIAGNOSIS — K651 Peritoneal abscess: Secondary | ICD-10-CM | POA: Diagnosis not present

## 2024-01-18 DIAGNOSIS — H4052X3 Glaucoma secondary to other eye disorders, left eye, severe stage: Secondary | ICD-10-CM | POA: Diagnosis not present

## 2024-01-18 DIAGNOSIS — R55 Syncope and collapse: Secondary | ICD-10-CM

## 2024-01-18 DIAGNOSIS — W19XXXA Unspecified fall, initial encounter: Secondary | ICD-10-CM

## 2024-01-18 DIAGNOSIS — Z79899 Other long term (current) drug therapy: Secondary | ICD-10-CM | POA: Diagnosis not present

## 2024-01-18 DIAGNOSIS — Y838 Other surgical procedures as the cause of abnormal reaction of the patient, or of later complication, without mention of misadventure at the time of the procedure: Secondary | ICD-10-CM | POA: Diagnosis present

## 2024-01-18 DIAGNOSIS — I129 Hypertensive chronic kidney disease with stage 1 through stage 4 chronic kidney disease, or unspecified chronic kidney disease: Secondary | ICD-10-CM | POA: Diagnosis not present

## 2024-01-18 DIAGNOSIS — F0150 Vascular dementia without behavioral disturbance: Secondary | ICD-10-CM | POA: Diagnosis not present

## 2024-01-18 LAB — URINALYSIS, ROUTINE W REFLEX MICROSCOPIC
Bilirubin Urine: NEGATIVE
Glucose, UA: NEGATIVE mg/dL
Ketones, ur: NEGATIVE mg/dL
Leukocytes,Ua: NEGATIVE
Nitrite: NEGATIVE
Protein, ur: 100 mg/dL — AB
Specific Gravity, Urine: 1.015 (ref 1.005–1.030)
pH: 6 (ref 5.0–8.0)

## 2024-01-18 LAB — BASIC METABOLIC PANEL WITH GFR
Anion gap: 9 (ref 5–15)
BUN: 17 mg/dL (ref 8–23)
CO2: 26 mmol/L (ref 22–32)
Calcium: 8.6 mg/dL — ABNORMAL LOW (ref 8.9–10.3)
Chloride: 105 mmol/L (ref 98–111)
Creatinine, Ser: 1.21 mg/dL (ref 0.61–1.24)
GFR, Estimated: 58 mL/min — ABNORMAL LOW (ref >60)
Glucose, Bld: 180 mg/dL — ABNORMAL HIGH (ref 70–99)
Potassium: 3.2 mmol/L — ABNORMAL LOW (ref 3.5–5.1)
Sodium: 140 mmol/L (ref 135–145)

## 2024-01-18 LAB — ECHOCARDIOGRAM COMPLETE
AR max vel: 1.15 cm2
AV Area VTI: 1.12 cm2
AV Area mean vel: 1.16 cm2
AV Mean grad: 20 mmHg
AV Peak grad: 37.1 mmHg
Ao pk vel: 3.05 m/s
Height: 70 in
S' Lateral: 3.7 cm
Weight: 2426.82 [oz_av]

## 2024-01-18 LAB — CBC
HCT: 38.6 % — ABNORMAL LOW (ref 39.0–52.0)
Hemoglobin: 13.4 g/dL (ref 13.0–17.0)
MCH: 31.8 pg (ref 26.0–34.0)
MCHC: 34.7 g/dL (ref 30.0–36.0)
MCV: 91.7 fL (ref 80.0–100.0)
Platelets: 143 K/uL — ABNORMAL LOW (ref 150–400)
RBC: 4.21 MIL/uL — ABNORMAL LOW (ref 4.22–5.81)
RDW: 13.3 % (ref 11.5–15.5)
WBC: 5.6 K/uL (ref 4.0–10.5)
nRBC: 0 % (ref 0.0–0.2)

## 2024-01-18 LAB — PHOSPHORUS: Phosphorus: 3.5 mg/dL (ref 2.5–4.6)

## 2024-01-18 LAB — MAGNESIUM: Magnesium: 1.9 mg/dL (ref 1.7–2.4)

## 2024-01-18 MED ORDER — ONDANSETRON HCL 4 MG/2ML IJ SOLN: 4.0000 mg | Freq: Four times a day (QID) | INTRAMUSCULAR | Status: DC | PRN

## 2024-01-18 MED ORDER — SODIUM CHLORIDE 0.9% FLUSH
3.0000 mL | Freq: Two times a day (BID) | INTRAVENOUS | Status: DC
  Administered 2024-01-18 – 2024-01-22 (×10): 3 mL via INTRAVENOUS

## 2024-01-18 MED ORDER — HYDRALAZINE HCL 20 MG/ML IJ SOLN
10.0000 mg | INTRAMUSCULAR | Status: DC | PRN
  Administered 2024-01-18 – 2024-01-19 (×2): 10 mg via INTRAVENOUS
  Filled 2024-01-18 (×2): qty 1

## 2024-01-18 MED ORDER — SODIUM CHLORIDE 0.9 % IV BOLUS
1000.0000 mL | Freq: Once | INTRAVENOUS | Status: AC
  Administered 2024-01-18: 1000 mL via INTRAVENOUS

## 2024-01-18 MED ORDER — AMLODIPINE BESYLATE 5 MG PO TABS: 2.5000 mg | ORAL_TABLET | Freq: Every day | ORAL | Status: DC

## 2024-01-18 MED ORDER — AMLODIPINE BESYLATE 5 MG PO TABS
5.0000 mg | ORAL_TABLET | Freq: Every day | ORAL | Status: DC
  Administered 2024-01-19 – 2024-01-21 (×3): 5 mg via ORAL
  Filled 2024-01-18 (×3): qty 1

## 2024-01-18 MED ORDER — GADOBUTROL 1 MMOL/ML IV SOLN
7.0000 mL | Freq: Once | INTRAVENOUS | Status: AC | PRN
  Administered 2024-01-18: 7 mL via INTRAVENOUS

## 2024-01-18 MED ORDER — HYDRALAZINE HCL 20 MG/ML IJ SOLN
10.0000 mg | Freq: Four times a day (QID) | INTRAMUSCULAR | Status: DC | PRN
  Administered 2024-01-18: 10 mg via INTRAVENOUS
  Filled 2024-01-18: qty 1

## 2024-01-18 MED ORDER — MEMANTINE HCL ER 28 MG PO CP24
28.0000 mg | ORAL_CAPSULE | Freq: Every day | ORAL | Status: DC
  Administered 2024-01-18 – 2024-01-21 (×4): 28 mg via ORAL
  Filled 2024-01-18 (×4): qty 1

## 2024-01-18 MED ORDER — IRBESARTAN 150 MG PO TABS
75.0000 mg | ORAL_TABLET | Freq: Every day | ORAL | Status: DC
  Administered 2024-01-18 – 2024-01-20 (×3): 75 mg via ORAL
  Filled 2024-01-18 (×4): qty 1

## 2024-01-18 MED ORDER — ROSUVASTATIN CALCIUM 20 MG PO TABS
20.0000 mg | ORAL_TABLET | ORAL | Status: DC
  Administered 2024-01-19 – 2024-01-22 (×2): 20 mg via ORAL
  Filled 2024-01-18 (×2): qty 1

## 2024-01-18 MED ORDER — POLYETHYLENE GLYCOL 3350 17 G PO PACK
17.0000 g | PACK | Freq: Every day | ORAL | Status: DC | PRN
  Administered 2024-01-20: 17 g via ORAL
  Filled 2024-01-18: qty 1

## 2024-01-18 MED ORDER — MELATONIN 3 MG PO TABS
6.0000 mg | ORAL_TABLET | Freq: Every day | ORAL | Status: DC
  Administered 2024-01-18 – 2024-01-21 (×4): 6 mg via ORAL
  Filled 2024-01-18 (×4): qty 2

## 2024-01-18 MED ORDER — AMLODIPINE BESYLATE 5 MG PO TABS
2.5000 mg | ORAL_TABLET | Freq: Every day | ORAL | Status: DC
  Administered 2024-01-18: 2.5 mg via ORAL
  Filled 2024-01-18: qty 1

## 2024-01-18 MED ORDER — POTASSIUM CHLORIDE 20 MEQ PO PACK
40.0000 meq | PACK | Freq: Once | ORAL | Status: AC
  Administered 2024-01-18: 40 meq via ORAL
  Filled 2024-01-18: qty 2

## 2024-01-18 MED ORDER — MELATONIN 3 MG PO TABS: 6.0000 mg | ORAL_TABLET | Freq: Every evening | ORAL | Status: DC | PRN

## 2024-01-18 MED ORDER — LATANOPROST 0.005 % OP SOLN
1.0000 [drp] | Freq: Every day | OPHTHALMIC | Status: DC
  Administered 2024-01-18 – 2024-01-21 (×4): 1 [drp] via OPHTHALMIC
  Filled 2024-01-18 (×2): qty 2.5

## 2024-01-18 MED ORDER — MEMANTINE HCL ER 28 MG PO CP24: 28.0000 mg | ORAL_CAPSULE | Freq: Every day | ORAL | Status: DC

## 2024-01-18 MED ORDER — ALBUTEROL SULFATE (2.5 MG/3ML) 0.083% IN NEBU: 2.5000 mg | INHALATION_SOLUTION | RESPIRATORY_TRACT | Status: DC | PRN

## 2024-01-18 MED ORDER — ESCITALOPRAM OXALATE 10 MG PO TABS
5.0000 mg | ORAL_TABLET | Freq: Every day | ORAL | Status: DC
  Administered 2024-01-18 – 2024-01-22 (×5): 5 mg via ORAL
  Filled 2024-01-18 (×5): qty 1

## 2024-01-18 MED ORDER — IOHEXOL 350 MG/ML SOLN
75.0000 mL | Freq: Once | INTRAVENOUS | Status: AC | PRN
  Administered 2024-01-18: 75 mL via INTRAVENOUS

## 2024-01-18 MED ORDER — HYDRALAZINE HCL 20 MG/ML IJ SOLN: 10.0000 mg | Freq: Four times a day (QID) | INTRAMUSCULAR | Status: DC | PRN

## 2024-01-18 MED ORDER — ACETAMINOPHEN 500 MG PO TABS
1000.0000 mg | ORAL_TABLET | Freq: Four times a day (QID) | ORAL | Status: DC | PRN
  Administered 2024-01-18 – 2024-01-20 (×2): 1000 mg via ORAL
  Filled 2024-01-18 (×2): qty 2

## 2024-01-18 NOTE — ED Notes (Signed)
Pt given a snack bag

## 2024-01-18 NOTE — Progress Notes (Signed)
 EEG complete - results pending

## 2024-01-18 NOTE — Progress Notes (Signed)
 STROKE TEAM PROGRESS NOTE   SUBJECTIVE (INTERVAL HISTORY) No family is at the bedside.  Overall his condition is stable. Pt awake alert, not fully orientated, incoherent speech, but no focal deficit.    OBJECTIVE Temp:  [97.8 F (36.6 C)-98.5 F (36.9 C)] 98.5 F (36.9 C) (07/06 0936) Pulse Rate:  [36-159] 73 (07/06 1030) Cardiac Rhythm: Atrial fibrillation (07/06 0547) Resp:  [11-21] 16 (07/06 1030) BP: (125-172)/(75-116) 131/75 (07/06 1030) SpO2:  [97 %-100 %] 100 % (07/06 1030) Weight:  [70.3 kg] 70.3 kg (07/05 1810)  No results for input(s): GLUCAP in the last 168 hours. Recent Labs  Lab 01/17/24 1715 01/17/24 1735 01/18/24 0415  NA 142 144 140  K 3.5 3.5 3.2*  CL 105 104 105  CO2 28  --  26  GLUCOSE 109* 104* 180*  BUN 16 18 17   CREATININE 1.41* 1.40* 1.21  CALCIUM  8.8*  --  8.6*  MG  --   --  1.9  PHOS  --   --  3.5   Recent Labs  Lab 01/17/24 1715  AST 30  ALT 22  ALKPHOS 86  BILITOT 0.6  PROT 6.8  ALBUMIN  3.1*   Recent Labs  Lab 01/16/24 0547 01/17/24 1715 01/17/24 1735 01/18/24 0415  WBC 5.6 6.4  --  5.6  NEUTROABS 3.5  --   --   --   HGB 14.1 14.2 13.6 13.4  HCT 39.9 39.2 40.0 38.6*  MCV 90.7 91.4  --  91.7  PLT 145* 152  --  143*   No results for input(s): CKTOTAL, CKMB, CKMBINDEX, TROPONINI in the last 168 hours. Recent Labs    01/17/24 1715  LABPROT 18.0*  INR 1.4*   Recent Labs    01/17/24 1715  COLORURINE YELLOW  LABSPEC 1.015  PHURINE 6.0  GLUCOSEU NEGATIVE  HGBUR SMALL*  BILIRUBINUR NEGATIVE  KETONESUR NEGATIVE  PROTEINUR 100*  NITRITE NEGATIVE  LEUKOCYTESUR NEGATIVE       Component Value Date/Time   CHOL 137 06/11/2023 1156   TRIG 84.0 06/11/2023 1156   HDL 42.40 06/11/2023 1156   CHOLHDL 3 06/11/2023 1156   VLDL 16.8 06/11/2023 1156   LDLCALC 78 06/11/2023 1156   Lab Results  Component Value Date   HGBA1C 6.2 11/12/2023   No results found for: LABOPIA, COCAINSCRNUR, LABBENZ, AMPHETMU,  THCU, LABBARB  Recent Labs  Lab 01/17/24 1715  ETH <15    I have personally reviewed the radiological images below and agree with the radiology interpretations.  EEG adult Result Date: 01/18/2024 Matthews Elida HERO, MD     01/18/2024  8:54 AM Routine EEG Report Calvin Burnett is a 89 y.o. male with a history of altered mental status and subacute subarachnoid hemorrhage who is undergoing an EEG to evaluate for seizures. Report: This EEG was acquired with electrodes placed according to the International 10-20 electrode system (including Fp1, Fp2, F3, F4, C3, C4, P3, P4, O1, O2, T3, T4, T5, T6, A1, A2, Fz, Cz, Pz). The following electrodes were missing or displaced: none. The occipital dominant rhythm was 7 Hz. This activity is reactive to stimulation. Drowsiness was manifested by background fragmentation; deeper stages of sleep were not identified. There was no focal slowing. There were no interictal epileptiform discharges. There were no electrographic seizures identified. Photic stimulation and hyperventilation were not performed. Impression and clinical correlation: This EEG was obtained while awake and drowsy and is abnormal due to mild diffuse slowing indicative of global cerebral dysfunction. Epileptiform abnormalities were not  seen during this recording. Elida Ross, MD [facility] [phone] If 7pm- 7am, please page neurology on call as listed in AMION.   MR Brain W and Wo Contrast Result Date: 01/18/2024 CLINICAL DATA:  Initial evaluation for acute mental status change. EXAM: MRI HEAD WITHOUT AND WITH CONTRAST TECHNIQUE: Multiplanar, multiecho pulse sequences of the brain and surrounding structures were obtained without and with intravenous contrast. CONTRAST:  7mL GADAVIST  GADOBUTROL  1 MMOL/ML IV SOLN COMPARISON:  CT from 01/17/2024 FINDINGS: Brain: Generalized age-related cerebral atrophy. Patchy T2/FLAIR hyperintensity involving the periventricular and deep white matter, most  characteristic of chronic microvascular ischemic disease, mild for age. Encephalomalacia and gliosis involving the right temporal lobe, consistent with a chronic right MCA distribution infarct. Chronic hemosiderin staining present at this location. Additional small remote left frontal cortical infarct noted. No evidence for acute or subacute infarct. Punctate focus of diffusion signal abnormality at the left occipital lobe felt to be related to adjacent susceptibility artifact (series 5, image 76). Multiple scattered foci of susceptibility artifact are seen involving the bilateral cerebral hemispheres, consistent with micro hemorrhages. A few of these appear to correspond with hyperdensity seen on prior head CT, consistent with small acute bleeds. Most notable of these measures 7 mm at the left occipital lobe (series 12, image 27). These foci are predominantly peripheral in location, with most of these foci new as compared to prior brain MRI from 12/04/2022. Additionally, there is abnormal sulcal FLAIR signal intensity with hemosiderin staining within the left temporal occipital region, concerning for subarachnoid hemorrhage (series 11, images 32-21). No appreciable subarachnoid blood seen on prior head CT. No mass lesion, midline shift, or significant mass effect. Mild ex vacuo dilatation of the right lateral ventricle without hydrocephalus. No extra-axial fluid collection. Pituitary gland and suprasellar region within normal limits. Asymmetric leptomeningeal enhancement seen within the left temporal occipital region, likely reactive in nature due to the underlying subarachnoid findings at these locations (series 17, image 6). No other abnormal enhancement. No findings to suggest metastatic disease. Vascular: Major intracranial vascular flow voids are maintained. Skull and upper cervical spine: Craniocervical junction within normal limits. No acute scalp soft tissue abnormality. Sinuses/Orbits: Prior bilateral  ocular lens replacement. Globes or soft tissues demonstrate no acute finding. Mild scattered mucosal thickening present about the ethmoidal air cells. Paranasal sinuses are otherwise clear. No significant mastoid effusion. Other: None. IMPRESSION: 1. Multiple scattered foci of susceptibility artifact involving the bilateral cerebral hemispheres, consistent with micro hemorrhages. A few of these appear to correspond with hyperdensities seen on prior head CT, consistent with small acute bleeds. Additionally, there is abnormal sulcal FLAIR signal intensity with hemosiderin staining within the left temporoccipital region, concerning for underlying subarachnoid hemorrhage, suspected to be subacute in nature as no hyperdense subarachnoid blood seen on prior head CT. Findings are nonspecific, with primary differential considerations including sequelae of cerebral amyloid angiopathy, trauma, or coagulopathy. 2. Asymmetric leptomeningeal enhancement within the left temporoccipital region, likely reactive in nature due to the underlying subarachnoid findings at these locations. 3. No other acute intracranial abnormality. 4. Chronic right MCA distribution infarct, with additional small remote left frontal cortical infarct. Electronically Signed   By: Morene Hoard M.D.   On: 01/18/2024 01:48   CT HEAD WO CONTRAST Addendum Date: 01/17/2024 ADDENDUM REPORT: 01/17/2024 18:20 ADDENDUM: These results were called by telephone at the time of interpretation on 01/17/2024 at 6:20 pm to provider [facility] , who verbally acknowledged these results. Electronically Signed   By: Morgane  Naveau M.D.  On: 01/17/2024 18:20   Result Date: 01/17/2024 CLINICAL DATA:  Head trauma, moderate-severe; Polytrauma, blunt. History of prostate cancer. EXAM: CT HEAD WITHOUT CONTRAST CT CERVICAL SPINE WITHOUT CONTRAST TECHNIQUE: Multidetector CT imaging of the head and cervical spine was performed following the standard protocol without  intravenous contrast. Multiplanar CT image reconstructions of the cervical spine were also generated. RADIATION DOSE REDUCTION: This exam was performed according to the departmental dose-optimization program which includes automated exposure control, adjustment of the mA and/or kV according to patient size and/or use of iterative reconstruction technique. COMPARISON:  CT head 11/14/2022 FINDINGS: CT HEAD FINDINGS Brain: Patchy and confluent areas of decreased attenuation are noted throughout the deep and periventricular white matter of the cerebral hemispheres bilaterally, compatible with chronic microvascular ischemic disease. 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 No evidence of large-territorial acute infarction. Several foci of hyperdensity along the left occipital and temporal lobes best evaluated on sagittal and coronal view (5:38, 4:56) with these measuring 4-5 mm each. No mass lesion. No extra-axial collection. No mass effect or midline shift. No hydrocephalus. Basilar cisterns are patent. Vascular: No hyperdense vessel. Atherosclerotic calcifications are present within the cavernous internal carotid arteries. Skull: No acute fracture or focal lesion. Sinuses/Orbits: Paranasal sinuses and mastoid air cells are clear. The orbits are unremarkable. Other: None. CT CERVICAL SPINE FINDINGS Alignment: Normal. Skull base and vertebrae: Multilevel moderate degenerative changes of the spine. Associated severe left C4-C5 osseous neural foramina. No associated severe osseous central canal stenosis. No acute fracture. No aggressive appearing focal osseous lesion or focal pathologic process. Soft tissues and spinal canal: No prevertebral fluid or swelling. No visible canal hematoma. Upper chest: Unremarkable. Other: Atherosclerotic plaque of the carotid arteries within the neck. IMPRESSION: 1. Total of four 4-5 mm hyperdense foci along the left frontal, temporal, occipital lobes. Findings could  represent intraparenchymal foci of hemorrhage versus underlying metastases. Recommend follow-up CT head in 4-6 hours. Consider MRI head with and without contrast for evaluation of underlying masses if clinically indicated. 2. No acute displaced fracture or traumatic listhesis of the cervical spine. 3. Severe left C4-C5 osseous neural foramina. Electronically Signed: By: Morgane  Naveau M.D. On: 01/17/2024 18:17   CT CERVICAL SPINE WO CONTRAST Addendum Date: 01/17/2024 ADDENDUM REPORT: 01/17/2024 18:20 ADDENDUM: These results were called by telephone at the time of interpretation on 01/17/2024 at 6:20 pm to provider [facility] , who verbally acknowledged these results. Electronically Signed   By: Morgane  Naveau M.D.   On: 01/17/2024 18:20   Result Date: 01/17/2024 CLINICAL DATA:  Head trauma, moderate-severe; Polytrauma, blunt. History of prostate cancer. EXAM: CT HEAD WITHOUT CONTRAST CT CERVICAL SPINE WITHOUT CONTRAST TECHNIQUE: Multidetector CT imaging of the head and cervical spine was performed following the standard protocol without intravenous contrast. Multiplanar CT image reconstructions of the cervical spine were also generated. RADIATION DOSE REDUCTION: This exam was performed according to the departmental dose-optimization program which includes automated exposure control, adjustment of the mA and/or kV according to patient size and/or use of iterative reconstruction technique. COMPARISON:  CT head 11/14/2022 FINDINGS: CT HEAD FINDINGS Brain: Patchy and confluent areas of decreased attenuation are noted throughout the deep and periventricular white matter of the cerebral hemispheres bilaterally, compatible with chronic microvascular ischemic disease. 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 No evidence of large-territorial acute infarction. Several foci of hyperdensity along the left occipital and temporal lobes best evaluated on sagittal and coronal view (5:38, 4:56) with  these measuring 4-5 mm each. No  mass lesion. No extra-axial collection. No mass effect or midline shift. No hydrocephalus. Basilar cisterns are patent. Vascular: No hyperdense vessel. Atherosclerotic calcifications are present within the cavernous internal carotid arteries. Skull: No acute fracture or focal lesion. Sinuses/Orbits: Paranasal sinuses and mastoid air cells are clear. The orbits are unremarkable. Other: None. CT CERVICAL SPINE FINDINGS Alignment: Normal. Skull base and vertebrae: Multilevel moderate degenerative changes of the spine. Associated severe left C4-C5 osseous neural foramina. No associated severe osseous central canal stenosis. No acute fracture. No aggressive appearing focal osseous lesion or focal pathologic process. Soft tissues and spinal canal: No prevertebral fluid or swelling. No visible canal hematoma. Upper chest: Unremarkable. Other: Atherosclerotic plaque of the carotid arteries within the neck. IMPRESSION: 1. Total of four 4-5 mm hyperdense foci along the left frontal, temporal, occipital lobes. Findings could represent intraparenchymal foci of hemorrhage versus underlying metastases. Recommend follow-up CT head in 4-6 hours. Consider MRI head with and without contrast for evaluation of underlying masses if clinically indicated. 2. No acute displaced fracture or traumatic listhesis of the cervical spine. 3. Severe left C4-C5 osseous neural foramina. Electronically Signed: By: Morgane  Naveau M.D. On: 01/17/2024 18:17   DG Pelvis Portable Result Date: 01/17/2024 CLINICAL DATA:  Trauma, fall. EXAM: PORTABLE PELVIS 1-2 VIEWS COMPARISON:  04/16/2019. FINDINGS: There is no evidence of acute fracture or dislocation. Mild degenerative changes are noted at the hips bilaterally. There are degenerative changes in lower lumbar spine. Radiation therapy seeds are noted in the region of the prostate gland. IMPRESSION: No acute fracture or dislocation. Electronically Signed   By: Leita Birmingham  M.D.   On: 01/17/2024 17:32   DG Chest Port 1 View Result Date: 01/17/2024 CLINICAL DATA:  Trauma.  Fall on blood thinners EXAM: PORTABLE CHEST 1 VIEW COMPARISON:  None Available. FINDINGS: Normal mediastinum and cardiac silhouette. Normal pulmonary vasculature. No evidence of effusion, infiltrate, or pneumothorax. No acute bony abnormality. IMPRESSION: No acute cardiopulmonary process. Electronically Signed   By: Jackquline Boxer M.D.   On: 01/17/2024 17:32     PHYSICAL EXAM  Temp:  [97.8 F (36.6 C)-98.5 F (36.9 C)] 98.5 F (36.9 C) (07/06 0936) Pulse Rate:  [36-159] 73 (07/06 1030) Resp:  [11-21] 16 (07/06 1030) BP: (125-172)/(75-116) 131/75 (07/06 1030) SpO2:  [97 %-100 %] 100 % (07/06 1030) Weight:  [70.3 kg] 70.3 kg (07/05 1810)  General - Well nourished, well developed, in no apparent distress.  Ophthalmologic - fundi not visualized due to noncooperation.  Cardiovascular - irregularly irregular heart rate and rhythm.  Neuro - awake, alert, eyes open, orientated to age and people and year but not to place, month or situation. No aphasia, seems to have fluent language but not coherent and jumping content, able to follow all simple commands. Able to name 2/4 and repeat simple sentences. No gaze palsy, tracking bilaterally, visual field full. No facial droop but bilateral dressing at lips due to skin cancer removal. Tongue midline. Bilateral UEs 4/5, no drift. Bilaterally LEs 3/5 proximal and 4/5 distally. Sensation symmetrical bilaterally, b/l FTN intact, gait not tested.     ASSESSMENT/PLAN Mr. Calvin Burnett is a 89 y.o. male with history of DM, HTN, afib on eliquis , dementia on namenda , squamous cell carcinoma, admitted for fall, confusion and syncope. No TNK given due to ICH.    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 Old right temporal infarct  MRI with and without  contrast Multiple scattered foci of susceptibility artifact involving the bilateral cerebral hemispheres, consistent with micro hemorrhages. A few of these appear to correspond with hyperdensities seen on prior head CT, consistent with small acute bleeds. Additionally, there is abnormal sulcal FLAIR signal intensity with hemosiderin staining within the left temporoccipital region, concerning for underlying subarachnoid hemorrhage, suspected to be subacute in nature. Findings are nonspecific, with primary differential considerations including sequelae of cerebral amyloid angiopathy, trauma, or coagulopathy. 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 Pt not SATURN candidate given dementia  Other Stroke Risk Factors Advanced age  Other Active Problems Dementia on namenda  - continue home meds Squamous cell carcinoma - b/l lip and facial dressing  Hospital day # 0    Ary Cummins, MD PhD Stroke Neurology 01/18/2024 11:16 AM  I discussed with Dr. Patsy. I spent additional  inpatient face-to-face time with the patient and family, reviewing test results, images and medication, and discussing the diagnosis, treatment plan and potential prognosis. This patient's care requiresreview of multiple databases, neurological assessment, discussion with family, other specialists and medical decision making of high complexity.      To contact Stroke Continuity provider, please refer to [email or web address]. After hours, contact General Neurology

## 2024-01-18 NOTE — Progress Notes (Signed)
 Progress Note    Calvin Burnett   FMW:[id number]  DOB: May 06, 1934  DOA: 01/17/2024     0 PCP: Katrinka Garnette KIDD, MD  Initial CC: syncope, confusion   Hospital Course: Calvin Burnett is an 89 yo male with PMH dementia, DMII, GERD, mild AS, afib, HTN, CKD3, colon cancer s/p R hemicolectomy 2019 and concomitant sigmoidectomy for diverticular stricture who was brought to the hospital for confusion.  He also had a witnessed syncopal episode when ambulating to the car before coming to the hospital.  Baseline dementia as noted and disoriented but can carry on conversation usually at baseline per daughter.  CT head noted hyperdense foci on the left brain concerning for hemorrhage. MRI brain was then performed and consistent with multiple areas of small acute bleeds involving the left frontal, temporal, occipital regions.  Findings also concerning for underlying cerebral amyloid angiopathy.  Chronic right MCA infarct and small left frontal infarct also noted.  SAH Cerebral angiopathy  Chronic CVA Acute on chronic encephalopathy - MRI brain confirming underlying small bleeds involving the left frontal, temporal, occipital region.  Noted to be subacute in nature and also concern for underlying cerebral ambulate angiopathy - Chronic right MCA infarct and small remote left frontal cortical infarct - Neurology also following on admission - EEG testing also ordered in setting of worsened confusion - Continue hydralazine  for pressure control.  Continue holding Eliquis    Syncope A-fib with slow VR - Unclear etiology of syncopal event however may be multifactorial from slow VR and SAH - Follow-up repeat echo - Continue telemetry, currently remains bradycardic in the 40s to 50s - Eventual check of orthostatic blood pressure when able - Continue holding Eliquis .  Hold Toprol    Acute myocardial injury No history of chest pain.  EKG with slow A-fib without acute ischemic changes.  High-sensitivity Trop 24  -> 22.  Suspect demand in setting of ?orthostatic BP drops, bradycardia. -Management directed at above   Hypokalemia Repleted   Vascular dementia:  - Holding Memantine  due to bradycardia; likely less of a culprit than bblocker. delirium precautions, fall precautions, scheduled melatonin.  Hx CVA - Noted was on DOAC per above which will be discontinued.  No antiplatelets in the setting of IPH.  - Continue home rosuvastatin   Mild aortic stenosis - follow up repeat echo   Hypertension - continue amlodipine  and ARB  DMII - home regimen metformin .  Not currently requiring SSI  CKD 3a - Baseline creatinine near 1.2 - 1.4. GFR 48-50  Colon adenocarcinoma stage I s/p R hemicolectomy '19 with concominant sigmoidectomy for diverticular stricture - noted outpatient surveillance  Mood d/o - Continue home escitalopram   Glaucoma - Continue home GTTs  Interval History:  Remaining confused in the ER but possibly near baseline in terms of his orientation.  Unable to recall events of the fall.  EEG testing being administered during exam.   Old records reviewed in assessment of this patient  Antimicrobials:   DVT prophylaxis:  SCDs Start: 01/18/24 0326   Code Status:   Code Status: Full Code  Mobility Assessment (Last 72 Hours)     Mobility Assessment     Row Name 01/18/24 0900           What is the highest level of mobility based on the progressive mobility assessment? Level 5 (Walks with assist in room/hall) - Balance while stepping forward/back and can walk in room with assist - Complete          Barriers to  discharge: None Disposition Plan: Home HH orders placed: TBD Status is: Observation, needs inpatient  Objective: Blood pressure 131/75, pulse 73, temperature 98.5 F (36.9 C), temperature source Oral, resp. rate 16, height 5' 10 (1.778 m), weight 70.3 kg, SpO2 100%.  Examination:  Physical Exam Constitutional:      Comments: Cooperative but confused  appearing elderly gentleman lying in bed in no distress  HENT:     Head:     Comments: Dried blood noted on face with trauma noted to right upper lip area and left lower jaw.  Sutures in place in the right upper lip approximately 8 sutures and left lower jaw noted with multiple sutures approximately 10-15    Mouth/Throat:     Mouth: Mucous membranes are dry.  Eyes:     Extraocular Movements: Extraocular movements intact.  Cardiovascular:     Rate and Rhythm: Bradycardia present. Rhythm irregular.  Pulmonary:     Effort: Pulmonary effort is normal. No respiratory distress.     Breath sounds: Normal breath sounds. No wheezing.  Abdominal:     General: Bowel sounds are normal. There is no distension.     Palpations: Abdomen is soft.     Tenderness: There is no abdominal tenderness.  Musculoskeletal:        General: Normal range of motion.     Cervical back: Normal range of motion and neck supple.  Skin:    General: Skin is warm and dry.  Neurological:     Mental Status: He is disoriented.      Consultants:  Neurology  Procedures:    Data Reviewed: Results for orders placed or performed during the hospital encounter of 01/17/24 (from the past 24 hours)  Comprehensive metabolic panel     Status: Abnormal   Collection Time: 01/17/24  5:15 PM  Result Value Ref Range   Sodium 142 135 - 145 mmol/L   Potassium 3.5 3.5 - 5.1 mmol/L   Chloride 105 98 - 111 mmol/L   CO2 28 22 - 32 mmol/L   Glucose, Bld 109 (H) 70 - 99 mg/dL   BUN 16 8 - 23 mg/dL   Creatinine, Ser 8.58 (H) 0.61 - 1.24 mg/dL   Calcium  8.8 (L) 8.9 - 10.3 mg/dL   Total Protein 6.8 6.5 - 8.1 g/dL   Albumin  3.1 (L) 3.5 - 5.0 g/dL   AST 30 15 - 41 U/L   ALT 22 0 - 44 U/L   Alkaline Phosphatase 86 38 - 126 U/L   Total Bilirubin 0.6 0.0 - 1.2 mg/dL   GFR, Estimated 48 (L) >60 mL/min   Anion gap 9 5 - 15  CBC     Status: Abnormal   Collection Time: 01/17/24  5:15 PM  Result Value Ref Range   WBC 6.4 4.0 - 10.5 K/uL    RBC 4.29 4.22 - 5.81 MIL/uL   Hemoglobin 14.2 13.0 - 17.0 g/dL   HCT 60.7 60.9 - 47.9 %   MCV 91.4 80.0 - 100.0 fL   MCH 33.1 26.0 - 34.0 pg   MCHC 36.2 (H) 30.0 - 36.0 g/dL   RDW 86.7 88.4 - 84.4 %   Platelets 152 150 - 400 K/uL   nRBC 0.0 0.0 - 0.2 %  Ethanol     Status: None   Collection Time: 01/17/24  5:15 PM  Result Value Ref Range   Alcohol , Ethyl (B) <15 <15 mg/dL  Urinalysis, Routine w reflex microscopic -Urine, Clean Catch     Status:  Abnormal   Collection Time: 01/17/24  5:15 PM  Result Value Ref Range   Color, Urine YELLOW YELLOW   APPearance CLEAR CLEAR   Specific Gravity, Urine 1.015 1.005 - 1.030   pH 6.0 5.0 - 8.0   Glucose, UA NEGATIVE NEGATIVE mg/dL   Hgb urine dipstick SMALL (A) NEGATIVE   Bilirubin Urine NEGATIVE NEGATIVE   Ketones, ur NEGATIVE NEGATIVE mg/dL   Protein, ur 899 (A) NEGATIVE mg/dL   Nitrite NEGATIVE NEGATIVE   Leukocytes,Ua NEGATIVE NEGATIVE   RBC / HPF 21-50 0 - 5 RBC/hpf   WBC, UA 0-5 0 - 5 WBC/hpf   Bacteria, UA RARE (A) NONE SEEN   Squamous Epithelial / HPF 0-5 0 - 5 /HPF   Mucus PRESENT   Protime-INR     Status: Abnormal   Collection Time: 01/17/24  5:15 PM  Result Value Ref Range   Prothrombin  Time 18.0 (H) 11.4 - 15.2 seconds   INR 1.4 (H) 0.8 - 1.2  Troponin I (High Sensitivity)     Status: Abnormal   Collection Time: 01/17/24  5:15 PM  Result Value Ref Range   Troponin I (High Sensitivity) 24 (H) <18 ng/L  Sample to Blood Bank     Status: None   Collection Time: 01/17/24  5:24 PM  Result Value Ref Range   Blood Bank Specimen SAMPLE AVAILABLE FOR TESTING    Sample Expiration      01/20/2024,2359 Performed at [facility], [address] [place], [place] [address]   I-Stat Chem 8, ED     Status: Abnormal   Collection Time: 01/17/24  5:35 PM  Result Value Ref Range   Sodium 144 135 - 145 mmol/L   Potassium 3.5 3.5 - 5.1 mmol/L   Chloride 104 98 - 111 mmol/L   BUN 18 8 - 23 mg/dL   Creatinine, Ser 8.59 (H) 0.61  - 1.24 mg/dL   Glucose, Bld 895 (H) 70 - 99 mg/dL   Calcium , Ion 1.08 (L) 1.15 - 1.40 mmol/L   TCO2 26 22 - 32 mmol/L   Hemoglobin 13.6 13.0 - 17.0 g/dL   HCT 59.9 60.9 - 47.9 %  I-Stat Lactic Acid, ED     Status: None   Collection Time: 01/17/24  5:35 PM  Result Value Ref Range   Lactic Acid, Venous 1.4 0.5 - 1.9 mmol/L  Troponin I (High Sensitivity)     Status: Abnormal   Collection Time: 01/17/24  7:35 PM  Result Value Ref Range   Troponin I (High Sensitivity) 22 (H) <18 ng/L  Basic metabolic panel with GFR     Status: Abnormal   Collection Time: 01/18/24  4:15 AM  Result Value Ref Range   Sodium 140 135 - 145 mmol/L   Potassium 3.2 (L) 3.5 - 5.1 mmol/L   Chloride 105 98 - 111 mmol/L   CO2 26 22 - 32 mmol/L   Glucose, Bld 180 (H) 70 - 99 mg/dL   BUN 17 8 - 23 mg/dL   Creatinine, Ser 8.78 0.61 - 1.24 mg/dL   Calcium  8.6 (L) 8.9 - 10.3 mg/dL   GFR, Estimated 58 (L) >60 mL/min   Anion gap 9 5 - 15  CBC     Status: Abnormal   Collection Time: 01/18/24  4:15 AM  Result Value Ref Range   WBC 5.6 4.0 - 10.5 K/uL   RBC 4.21 (L) 4.22 - 5.81 MIL/uL   Hemoglobin 13.4 13.0 - 17.0 g/dL   HCT 61.3 (  L) 39.0 - 52.0 %   MCV 91.7 80.0 - 100.0 fL   MCH 31.8 26.0 - 34.0 pg   MCHC 34.7 30.0 - 36.0 g/dL   RDW 86.6 88.4 - 84.4 %   Platelets 143 (L) 150 - 400 K/uL   nRBC 0.0 0.0 - 0.2 %  Magnesium      Status: None   Collection Time: 01/18/24  4:15 AM  Result Value Ref Range   Magnesium  1.9 1.7 - 2.4 mg/dL  Phosphorus     Status: None   Collection Time: 01/18/24  4:15 AM  Result Value Ref Range   Phosphorus 3.5 2.5 - 4.6 mg/dL    I have reviewed pertinent nursing notes, vitals, labs, and images as necessary. I have ordered labwork to follow up on as indicated.  I have reviewed the last notes from staff over past 24 hours. I have discussed patient's care plan and test results with nursing staff, CM/SW, and other staff as appropriate.  Time spent: Greater than 50% of the 55 minute visit  was spent in counseling/coordination of care for the patient as laid out in the A&P.   LOS: 0 days   Alm Apo, MD [name] 01/18/2024, 11:06 AM
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
## 2024-01-18 NOTE — Plan of Care (Signed)

## 2024-01-18 NOTE — Progress Notes (Signed)
 Physical Therapy Evaluation Patient Details Name: Calvin Burnett MRN: [id number] DOB: Dec 25, 1933 Today's Date: 01/18/2024  History of Present Illness  Calvin Burnett is a 89 y.o. male who presented to the ED 01/17/24 after a fall. CT Head with 4  hyperdense foci in L frontal, temporal and occipital lobe. MRI Brain most consistent with cerebral amyloid angiopathy and noted subacute appearing SAH. EEG abnormal due to mild diffuse slowing indicative of global cerebral dysfunction. PMHx:  vascular dementia, CVA, A-fib on AC, mild aortic stenosis, HTN, T2DM, CKD 3, colon adenocarcinoma stage I s/p R hemicolectomy '19 with concominant sigmoidectomy for diverticular stricture, and GERD.   Clinical Impression  Pt admitted with above diagnosis. PTA, pt was independent with functional mobility without an AD and independent with ADLs/IADLs. He lives with his daughter in a one story house with 2 STE. Pt currently with functional limitations due to the deficits listed below (see PT Problem List). He required supervision-CGA for bed mobility, transfers, and gait. Pt ambulated ~171ft and benefited from 1 HHA to improve balance. Discussed using RW to increase stability during mobility. Pt will benefit from acute skilled PT to increase his independence and safety with mobility to allow discharge. Recommend HHPT to improve balance, decrease fall risk, and optimize safety within the home environment.    If plan is discharge home, recommend the following: A little help with walking and/or transfers;A little help with bathing/dressing/bathroom;Assistance with cooking/housework;Direct supervision/assist for medications management;Direct supervision/assist for financial management;Help with stairs or ramp for entrance;Supervision due to cognitive status   Can travel by private vehicle        Equipment Recommendations None recommended by PT  Recommendations for Other Services       Functional Status Assessment Patient  has had a recent decline in their functional status and demonstrates the ability to make significant improvements in function in a reasonable and predictable amount of time.     Precautions / Restrictions Precautions Precautions: Fall Recall of Precautions/Restrictions: Intact Restrictions Weight Bearing Restrictions Per Provider Order: No      Mobility  Bed Mobility Overal bed mobility: Needs Assistance Bed Mobility: Supine to Sit, Sit to Supine     Supine to sit: Supervision Sit to supine: Supervision   General bed mobility comments: Pt sat up on R side of strecher with increased time. Supervision for safety. He returned to bed bed bringing BLE in and repositioning himself in the center.    Transfers Overall transfer level: Needs assistance Equipment used: None Transfers: Sit to/from Stand Sit to Stand: Contact guard assist           General transfer comment: Pt stood from strecher by pushing up with BUE support. CGA for safety/stability. Good eccentric control with sitting.    Ambulation/Gait Ambulation/Gait assistance: Contact guard assist Gait Distance (Feet): 100 Feet Assistive device: 1 person hand held assist, None Gait Pattern/deviations: Step-to pattern, Decreased step length - right, Decreased step length - left, Narrow base of support Gait velocity: decreased Gait velocity interpretation: <1.31 ft/sec, indicative of household ambulator   General Gait Details: Pt ambulated with short slow steps. He maintained upright posture and demonstrated adequate foot clearence. Pt utilized 1 HHA for increased support. He was slightly unsteady and displayed limited arm swing without HHA. No LOB.  Stairs            Wheelchair Mobility     Tilt Bed    Modified Rankin (Stroke Patients Only)       Balance Overall balance  assessment: Mild deficits observed, not formally tested                                           Pertinent  Vitals/Pain Pain Assessment Pain Assessment: PAINAD Breathing: normal Negative Vocalization: none Facial Expression: smiling or inexpressive Body Language: relaxed Consolability: no need to console PAINAD Score: 0    Home Living Family/patient expects to be discharged to:: Private residence Living Arrangements: Children Available Help at Discharge: Family;Available PRN/intermittently Type of Home: House Home Access: Stairs to enter Entrance Stairs-Rails: None Entrance Stairs-Number of Steps: 2   Home Layout: One level Home Equipment: Agricultural consultant (2 wheels);BSC/3in1 Additional Comments: Pt is a poor historian. Obtained home-set up and PLOF via phone call with daughter.    Prior Function Prior Level of Function : Independent/Modified Independent;Driving             Mobility Comments: Ambulates without AD. 1 fall that led to current admission. ADLs Comments: Indep with ADLs. Manages his own medications. Drives.     Extremity/Trunk Assessment   Upper Extremity Assessment Upper Extremity Assessment: Overall WFL for tasks assessed    Lower Extremity Assessment Lower Extremity Assessment: Overall WFL for tasks assessed    Cervical / Trunk Assessment Cervical / Trunk Assessment: Normal  Communication   Communication Communication: No apparent difficulties    Cognition Arousal: Alert Behavior During Therapy: WFL for tasks assessed/performed   PT - Cognitive impairments: History of cognitive impairments, No family/caregiver present to determine baseline                       PT - Cognition Comments: Vascular Dementia Following commands: Intact       Cueing Cueing Techniques: Verbal cues, Gestural cues     General Comments General comments (skin integrity, edema, etc.): Orthostatics taken, positive. Pt intermittently c/o dizziness. He has facial brusining and swelling over the maxilla and upper lip.    Exercises     Assessment/Plan    PT  Assessment Patient needs continued PT services  PT Problem List Decreased balance;Decreased mobility;Decreased safety awareness       PT Treatment Interventions DME instruction;Gait training;Stair training;Functional mobility training;Therapeutic activities;Therapeutic exercise;Balance training;Patient/family education    PT Goals (Current goals can be found in the Care Plan section)  Acute Rehab PT Goals Patient Stated Goal: Return Home PT Goal Formulation: With patient/family Time For Goal Achievement: 02/01/24 Potential to Achieve Goals: Good    Frequency Min 2X/week     Co-evaluation               AM-PAC PT 6 Clicks Mobility  Outcome Measure Help needed turning from your back to your side while in a flat bed without using bedrails?: A Little Help needed moving from lying on your back to sitting on the side of a flat bed without using bedrails?: A Little Help needed moving to and from a bed to a chair (including a wheelchair)?: A Little Help needed standing up from a chair using your arms (e.g., wheelchair or bedside chair)?: A Little Help needed to walk in hospital room?: A Little Help needed climbing 3-5 steps with a railing? : A Lot 6 Click Score: 17    End of Session Equipment Utilized During Treatment: Gait belt Activity Tolerance: Patient tolerated treatment well Patient left: in bed;with call bell/phone within reach Nurse Communication: Mobility status PT  Visit Diagnosis: Other abnormalities of gait and mobility (R26.89);Unsteadiness on feet (R26.81)    Time: 9095-9071 PT Time Calculation (min) (ACUTE ONLY): 24 min   Charges:   PT Evaluation $PT Eval Low Complexity: 1 Low PT Treatments $Gait Training: 8-22 mins PT General Charges $$ ACUTE PT VISIT: 1 Visit         Randall SAUNDERS, PT, DPT Acute Rehabilitation Services Office: [phone] Secure Chat Preferred  Calvin Burnett 01/18/2024, 11:31 AM

## 2024-01-18 NOTE — Procedures (Signed)
 Routine EEG Report  Calvin Burnett is a 89 y.o. male with a history of altered mental status and subacute subarachnoid hemorrhage who is undergoing an EEG to evaluate for seizures.  Report: This EEG was acquired with electrodes placed according to the International 10-20 electrode system (including Fp1, Fp2, F3, F4, C3, C4, P3, P4, O1, O2, T3, T4, T5, T6, A1, A2, Fz, Cz, Pz). The following electrodes were missing or displaced: none.  The occipital dominant rhythm was 7 Hz. This activity is reactive to stimulation. Drowsiness was manifested by background fragmentation; deeper stages of sleep were not identified. There was no focal slowing. There were no interictal epileptiform discharges. There were no electrographic seizures identified. Photic stimulation and hyperventilation were not performed.  Impression and clinical correlation: This EEG was obtained while awake and drowsy and is abnormal due to mild diffuse slowing indicative of global cerebral dysfunction. Epileptiform abnormalities were not seen during this recording.  Elida Ross, MD [facility] [phone]  If 7pm- 7am, please page neurology on call as listed in AMION.

## 2024-01-18 NOTE — Hospital Course (Addendum)
 Mr. Calvin Burnett is an 89 yo male with PMH dementia, DMII, GERD, mild AS, afib, HTN, CKD3, colon cancer s/p R hemicolectomy 2019 and concomitant sigmoidectomy for diverticular stricture who was brought to the hospital for confusion.  He also had a witnessed syncopal episode when ambulating to the car before coming to the hospital.  Baseline dementia as noted and disoriented but can carry on conversation usually at baseline per daughter.  CT head noted hyperdense foci on the left brain concerning for hemorrhage. MRI brain was then performed and consistent with multiple areas of small acute bleeds involving the left frontal, temporal, occipital regions.  Findings also concerning for underlying cerebral amyloid angiopathy.  Chronic right MCA infarct and small left frontal infarct also noted.  SAH Cerebral angiopathy  Chronic CVA Acute on chronic encephalopathy - MRI brain confirming underlying small bleeds involving the left frontal, temporal, occipital region.  Noted to be subacute in nature and also concern for underlying cerebral amyloid angiopathy - Chronic right MCA infarct and small remote left frontal cortical infarct - Neurology also following on admission - EEG testing also ordered in setting of worsened confusion - Continue hydralazine  for pressure control - explained to his daughter about risk of resuming Eliquis .  She understands.  - After discussion with neurology, he would be a candidate for short-term anticoagulation or DAPT.  Timing of this would need to be discussed with cardiology upon timing of watchman consideration (if a candidate).  This can be coordinated at outpatient follow-up   Syncope A-fib with slow VR - Unclear etiology of syncopal event however may be multifactorial from slow VR and SAH -Repeat echo does show worsening of his underlying aortic stenosis but appears moderate now from mild previously - Cardiology does not suspect that stenosis contributing to syncope -Heart  rate slowly improving while holding beta-blocker.  May need to resume at low-dose at discharge or continue holding and follow-up heart rate at upcoming EP appointment - Continue telemetry; bradycardia has improved from the 40s to 50s now up to the mid 60s - Eventual check of orthostatic blood pressure when able - Continue holding Eliquis .  Hold Toprol    Acute myocardial injury No history of chest pain.  EKG with slow A-fib without acute ischemic changes.  High-sensitivity Trop 24 -> 22.  Suspect demand in setting of ?orthostatic BP drops, bradycardia. -Management directed at above   Hypokalemia Repleted   Vascular dementia:  - Holding Memantine  due to bradycardia; likely less of a culprit than bblocker. delirium precautions, fall precautions, scheduled melatonin.  Hx CVA - Noted was on DOAC per above which will be discontinued.  No antiplatelets in the setting of IPH.  - Continue home rosuvastatin   Aortic stenosis - previously mild on last echo from 07/07/23; new echo shows worsening AV mean gradient; the AVA seems unchanged; doubt severe enough to have caused/contributed to the syncope unless he was very volume depleted too - No further workup per cardiology unless he becomes symptomatic  Melanoma - Patient had recent facial surgery this week removed melanoma from roof of mouth; the sutures he has in place on his right and left mouth are from the surgery not trauma from fall  Hypertension - continue amlodipine  and ARB  DMII - home regimen metformin .  Not currently requiring SSI  CKD 3a - Baseline creatinine near 1.2 - 1.4. GFR 48-50  Colon adenocarcinoma stage I s/p R hemicolectomy '19 with concominant sigmoidectomy for diverticular stricture - noted outpatient surveillance  Mood d/o - Continue home  escitalopram   Glaucoma - Continue home GTTs
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
## 2024-01-18 NOTE — H&P (Addendum)
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 Step Down Unit  Severity of Illness: The appropriate patient status for this patient is OBSERVATION. Observation status is judged to be reasonable and necessary in order to provide the required intensity of service to ensure the patient's safety. The patient's presenting symptoms, physical exam findings, and initial radiographic and laboratory data in the context of their medical condition is felt to place them at decreased risk for further clinical deterioration. Furthermore, it is anticipated that the patient will be medically stable for discharge from the hospital within 2 midnights of admission.    Dorn Dawson, MD [name]  How to contact the [facility] Attending or Consulting provider 7A - 7P or covering provider during after hours 7P -7A, for this patient.  Check the care team in [facility] and look for a) attending/consulting [facility] provider listed and b) the [facility] team listed Log into [email or web address] and use [place]'s universal password to access. If you do not have the password, please  contact the hospital operator. Locate the [facility] provider you are looking for under [name] and page to a number that you can be directly reached. If you still have difficulty reaching the provider, please page the [facility] (Director on Call) for the Hospitalists listed on amion for assistance.  01/18/2024, 4:56 AM

## 2024-01-18 NOTE — Progress Notes (Signed)
 Pt arrived to 3w 34 via stretcher from the ED. See assessment.

## 2024-01-18 NOTE — ED Provider Notes (Signed)
  Physical Exam  BP (!) 171/100   Pulse (!) 49   Temp 97.8 F (36.6 C) (Oral)   Resp 16   Ht 5' 10 (1.778 m)   Wt 70.3 kg   SpO2 97%   BMI 22.24 kg/m   Physical Exam Constitutional:      General: He is not in acute distress.    Appearance: Normal appearance.  HENT:     Head: Normocephalic.     Comments: Facial bruising    Nose: No congestion or rhinorrhea.  Eyes:     General:        Right eye: No discharge.        Left eye: No discharge.     Extraocular Movements: Extraocular movements intact.     Pupils: Pupils are equal, round, and reactive to light.  Cardiovascular:     Rate and Rhythm: Normal rate and regular rhythm.     Heart sounds: No murmur heard. Pulmonary:     Effort: No respiratory distress.     Breath sounds: No wheezing or rales.  Abdominal:     General: There is no distension.     Tenderness: There is no abdominal tenderness.  Musculoskeletal:        General: Normal range of motion.     Cervical back: Normal range of motion.  Skin:    General: Skin is warm and dry.  Neurological:     General: No focal deficit present.     Mental Status: He is alert.     Procedures  Procedures  ED Course / MDM    Medical Decision Making Amount and/or Complexity of Data Reviewed Labs: ordered. Radiology: ordered.  Risk Prescription drug management. Decision regarding hospitalization.   Patient received in handoff.  Fall on blood thinners initially arrived as a level 2 trauma.  Abnormal CT pending MRI.  MRI concerning for multiple scattered foci in bilateral cerebral hemispheres concerning for microhemorrhages consistent with small acute bleeds with abnormal sulcal FLAIR intensity concerning for subacute subarachnoid hemorrhage.  Spoke with neurology Dr. Vanessa who will come to evaluate the patient but is recommending hospital admission for further workup.  Concern for likely cerebral amyloid and will need to hold his Eliquis .  Patient admitted        Albertina Dixon, MD 01/18/24 [phone]

## 2024-01-18 NOTE — ED Notes (Signed)
Pt placed on fall alarm.  

## 2024-01-19 DIAGNOSIS — R55 Syncope and collapse: Secondary | ICD-10-CM | POA: Diagnosis not present

## 2024-01-19 DIAGNOSIS — T45515A Adverse effect of anticoagulants, initial encounter: Secondary | ICD-10-CM | POA: Diagnosis not present

## 2024-01-19 DIAGNOSIS — I350 Nonrheumatic aortic (valve) stenosis: Secondary | ICD-10-CM

## 2024-01-19 DIAGNOSIS — I609 Nontraumatic subarachnoid hemorrhage, unspecified: Secondary | ICD-10-CM | POA: Diagnosis not present

## 2024-01-19 DIAGNOSIS — G9341 Metabolic encephalopathy: Secondary | ICD-10-CM | POA: Diagnosis not present

## 2024-01-19 DIAGNOSIS — I611 Nontraumatic intracerebral hemorrhage in hemisphere, cortical: Secondary | ICD-10-CM | POA: Diagnosis not present

## 2024-01-19 DIAGNOSIS — I4891 Unspecified atrial fibrillation: Secondary | ICD-10-CM | POA: Diagnosis not present

## 2024-01-19 DIAGNOSIS — E859 Amyloidosis, unspecified: Secondary | ICD-10-CM | POA: Diagnosis not present

## 2024-01-19 LAB — CBC WITH DIFFERENTIAL/PLATELET
Abs Immature Granulocytes: 0.04 K/uL (ref 0.00–0.07)
Basophils Absolute: 0.1 K/uL (ref 0.0–0.1)
Basophils Relative: 1 %
Eosinophils Absolute: 0.2 K/uL (ref 0.0–0.5)
Eosinophils Relative: 2 %
HCT: 42 % (ref 39.0–52.0)
Hemoglobin: 14.6 g/dL (ref 13.0–17.0)
Immature Granulocytes: 1 %
Lymphocytes Relative: 39 %
Lymphs Abs: 3.2 K/uL (ref 0.7–4.0)
MCH: 31.9 pg (ref 26.0–34.0)
MCHC: 34.8 g/dL (ref 30.0–36.0)
MCV: 91.9 fL (ref 80.0–100.0)
Monocytes Absolute: 0.7 K/uL (ref 0.1–1.0)
Monocytes Relative: 9 %
Neutro Abs: 4 K/uL (ref 1.7–7.7)
Neutrophils Relative %: 48 %
Platelets: 171 K/uL (ref 150–400)
RBC: 4.57 MIL/uL (ref 4.22–5.81)
RDW: 13.2 % (ref 11.5–15.5)
WBC: 8.2 K/uL (ref 4.0–10.5)
nRBC: 0 % (ref 0.0–0.2)

## 2024-01-19 LAB — MAGNESIUM: Magnesium: 1.8 mg/dL (ref 1.7–2.4)

## 2024-01-19 LAB — BASIC METABOLIC PANEL WITH GFR
Anion gap: 12 (ref 5–15)
BUN: 12 mg/dL (ref 8–23)
CO2: 22 mmol/L (ref 22–32)
Calcium: 9.1 mg/dL (ref 8.9–10.3)
Chloride: 103 mmol/L (ref 98–111)
Creatinine, Ser: 1.19 mg/dL (ref 0.61–1.24)
GFR, Estimated: 59 mL/min — ABNORMAL LOW (ref >60)
Glucose, Bld: 105 mg/dL — ABNORMAL HIGH (ref 70–99)
Potassium: 3.5 mmol/L (ref 3.5–5.1)
Sodium: 137 mmol/L (ref 135–145)

## 2024-01-19 LAB — VITAMIN B12: Vitamin B-12: 352 pg/mL (ref 180–914)

## 2024-01-19 LAB — TSH: TSH: 6.968 u[IU]/mL — ABNORMAL HIGH (ref 0.350–4.500)

## 2024-01-19 MED ORDER — POLYVINYL ALCOHOL 1.4 % OP SOLN: 2.0000 [drp] | OPHTHALMIC | Status: DC | PRN

## 2024-01-19 MED ORDER — POTASSIUM CHLORIDE CRYS ER 20 MEQ PO TBCR
40.0000 meq | EXTENDED_RELEASE_TABLET | Freq: Once | ORAL | Status: AC
  Administered 2024-01-19: 40 meq via ORAL
  Filled 2024-01-19: qty 2

## 2024-01-19 NOTE — Evaluation (Signed)
 Occupational Therapy Evaluation Patient Details Name: Calvin Burnett MRN: [id number] DOB: Feb 05, 1934 Today's Date: 01/19/2024   History of Present Illness   Calvin Burnett is a 89 y.o. male who presented to the ED 01/17/24 after a fall. CT Head with 4  hyperdense foci in L frontal, temporal and occipital lobe. MRI Brain most consistent with cerebral amyloid angiopathy and noted subacute appearing SAH. EEG abnormal due to mild diffuse slowing indicative of global cerebral dysfunction. PMHx:  vascular dementia, CVA, A-fib on AC, mild aortic stenosis, HTN, T2DM, CKD 3, colon adenocarcinoma stage I s/p R hemicolectomy '19 with concominant sigmoidectomy for diverticular stricture, and GERD.     Clinical Impressions PTA patient independent with Adls, driving and managing meds.  Pt reports living with his daughter who works part time.  He was admitted for above and presents with problem list below.  He requires contact guard assist for bed mobility, min to mod assist for Adls. Pt oriented to self only today, follows simple commands with increased time but demonstrates difficulty follow multiple step commands; poor awareness to deficits and problem solving, unable to recall location after re-oriented. Limited session due to elevated BP (182/86 supine, 169/90 EOB).  Based on performance today, pt will best benefit from continued OT services acutely and after dc at [facility] level to optimize independence and safety with ADLs, IADLs and mobility.  IF pt does not have 24/7 support at dc, may need to look into <3hrs/day inpatient setting.       If plan is discharge home, recommend the following:   A little help with walking and/or transfers;A lot of help with bathing/dressing/bathroom;Direct supervision/assist for medications management;Direct supervision/assist for financial management;Assistance with cooking/housework;Assist for transportation;Help with stairs or ramp for entrance;Supervision due to cognitive  status     Functional Status Assessment   Patient has had a recent decline in their functional status and demonstrates the ability to make significant improvements in function in a reasonable and predictable amount of time.     Equipment Recommendations   BSC/3in1     Recommendations for Other Services         Precautions/Restrictions   Precautions Precautions: Fall Recall of Precautions/Restrictions: Impaired Precaution/Restrictions Comments: BP < 140 Restrictions Weight Bearing Restrictions Per Provider Order: No     Mobility Bed Mobility Overal bed mobility: Needs Assistance Bed Mobility: Supine to Sit, Sit to Supine     Supine to sit: Contact guard Sit to supine: Contact guard assist   General bed mobility comments: for safety, increased time    Transfers                   General transfer comment: deferred d/t elevated BP      Balance Overall balance assessment: Needs assistance Sitting-balance support: No upper extremity supported, Feet supported Sitting balance-Leahy Scale: Fair                                     ADL either performed or assessed with clinical judgement   ADL Overall ADL's : Needs assistance/impaired     Grooming: Minimal assistance;Bed level           Upper Body Dressing : Moderate assistance;Bed level   Lower Body Dressing: Moderate assistance;Sitting/lateral leans     Toilet Transfer Details (indicate cue type and reason): deferred d/t elevated BP         Functional mobility during ADLs:  Minimal assistance;Cueing for safety;Cueing for sequencing       Vision Baseline Vision/History: 1 Wears glasses Ability to See in Adequate Light: 1 Impaired Patient Visual Report: Other (comment) (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) Vision Assessment?: Vision impaired- to be further tested in functional context Additional Comments: pt able to locate # of fingers correctly in all 4  quadrants with some eye turns, poor depth perception but unsure if this is baseline due to L eye blindness     Perception         Praxis         Pertinent Vitals/Pain Pain Assessment Pain Assessment: Faces Faces Pain Scale: No hurt Pain Intervention(s): Limited activity within patient's tolerance, Monitored during session, Repositioned     Extremity/Trunk Assessment Upper Extremity Assessment Upper Extremity Assessment: Overall WFL for tasks assessed   Lower Extremity Assessment Lower Extremity Assessment: Defer to PT evaluation   Cervical / Trunk Assessment Cervical / Trunk Assessment: Normal   Communication Communication Communication: No apparent difficulties   Cognition Arousal: Alert Behavior During Therapy: WFL for tasks assessed/performed Cognition: Cognition impaired   Orientation impairments: Place, Time, Situation Awareness: Intellectual awareness impaired Memory impairment (select all impairments): Short-term memory, Working [employer] fast streamer, Non-declarative long-term memory, Geneticist, molecular long-term memory Attention impairment (select first level of impairment): Focused attention Executive functioning impairment (select all impairments): Initiation, Organization, Sequencing, Reasoning, Problem solving OT - Cognition Comments: pt oriented to self only, unable to recall place after education and continues to voice he is a home.  He follows 1 step commands with increased time.                 Following commands: Impaired Following commands impaired: Follows multi-step commands inconsistently, Follows one step commands with increased time     Cueing  General Comments   Cueing Techniques: Verbal cues;Gestural cues  BP supine 182/86  and EOB 169/90, RN notified and provided medication   Exercises     Shoulder Instructions      Home Living Family/patient expects to be discharged to:: Private residence Living Arrangements: Children Available Help at Discharge:  Family;Available PRN/intermittently Type of Home: House Home Access: Stairs to enter [employer] of Steps: 2 Entrance Stairs-Rails: None Home Layout: One level     Bathroom Shower/Tub: Producer, television/film/video: Standard     Home Equipment: Agricultural consultant (2 wheels);BSC/3in1   Additional Comments: Pt is a poor historian. PT obtained home-set up and PLOF via phone call with daughter.      Prior Functioning/Environment Prior Level of Function : Independent/Modified Independent             Mobility Comments: Ambulates without AD. 1 fall that led to current admission. ADLs Comments: Indep with ADLs. Manages his own medications. Drives.    OT Problem List: Decreased activity tolerance;Impaired balance (sitting and/or standing);Decreased cognition;Decreased safety awareness;Decreased knowledge of use of DME or AE;Decreased knowledge of precautions   OT Treatment/Interventions: Self-care/ADL training;DME and/or AE instruction;Cognitive remediation/compensation;Therapeutic activities;Patient/family education;Balance training      OT Goals(Current goals can be found in the care plan section)   Acute Rehab OT Goals Patient Stated Goal: none stated OT Goal Formulation: Patient unable to participate in goal setting Time For Goal Achievement: 02/02/24 Potential to Achieve Goals: Good   OT Frequency:  Min 2X/week    Co-evaluation              AM-PAC OT 6 Clicks Daily Activity     Outcome Measure  Help from another person eating meals?: A Little Help from another person taking care of personal grooming?: A Little Help from another person toileting, which includes using toliet, bedpan, or urinal?: A Lot Help from another person bathing (including washing, rinsing, drying)?: A Little Help from another person to put on and taking off regular upper body clothing?: A Little Help from another person to put on and taking off regular lower body clothing?: A  Little 6 Click Score: 17   End of Session Nurse Communication: Mobility status;Other (comment) (BP)  Activity Tolerance: Patient tolerated treatment well Patient left: in bed;with call bell/phone within reach;with bed alarm set;with family/visitor present  OT Visit Diagnosis: Other abnormalities of gait and mobility (R26.89);History of falling (Z91.81);Other symptoms and signs involving cognitive function                Time: 8852-8787 OT Time Calculation (min): 25 min Charges:  OT General Charges $OT Visit: 1 Visit OT Evaluation $OT Eval Moderate Complexity: 1 Mod OT Treatments $Self Care/Home Management : 8-22 mins  Etta NOVAK, OT Acute Rehabilitation Services Office [phone] Secure Chat Preferred    Etta GORMAN Hope 01/19/2024, 1:51 PM

## 2024-01-19 NOTE — Plan of Care (Signed)

## 2024-01-19 NOTE — Progress Notes (Signed)
 STROKE TEAM PROGRESS NOTE   SUBJECTIVE (INTERVAL HISTORY) Sisters and bother-in-law are at the bedside.  Pt is reclining in bed for lunch. His neuro stable. We discussed about blood thinner options, as well as watchman device. They will relay this to pt daughter.    OBJECTIVE Temp:  [97.5 F (36.4 C)-98.1 F (36.7 C)] 98.1 F (36.7 C) (07/07 0757) Pulse Rate:  [53-59] 59 (07/07 0757) Cardiac Rhythm: Atrial fibrillation (07/07 0731) Resp:  [18-20] 18 (07/07 0757) BP: (154-182)/(79-109) 182/86 (07/07 1201) SpO2:  [97 %-99 %] 97 % (07/07 0757)  No results for input(s): GLUCAP in the last 168 hours. Recent Labs  Lab 01/17/24 1715 01/17/24 1735 01/18/24 0415 01/19/24 0431  NA 142 144 140 137  K 3.5 3.5 3.2* 3.5  CL 105 104 105 103  CO2 28  --  26 22  GLUCOSE 109* 104* 180* 105*  BUN 16 18 17 12   CREATININE 1.41* 1.40* 1.21 1.19  CALCIUM  8.8*  --  8.6* 9.1  MG  --   --  1.9 1.8  PHOS  --   --  3.5  --    Recent Labs  Lab 01/17/24 1715  AST 30  ALT 22  ALKPHOS 86  BILITOT 0.6  PROT 6.8  ALBUMIN  3.1*   Recent Labs  Lab 01/16/24 0547 01/17/24 1715 01/17/24 1735 01/18/24 0415 01/19/24 0431  WBC 5.6 6.4  --  5.6 8.2  NEUTROABS 3.5  --   --   --  4.0  HGB 14.1 14.2 13.6 13.4 14.6  HCT 39.9 39.2 40.0 38.6* 42.0  MCV 90.7 91.4  --  91.7 91.9  PLT 145* 152  --  143* 171   No results for input(s): CKTOTAL, CKMB, CKMBINDEX, TROPONINI in the last 168 hours. Recent Labs    01/17/24 1715  LABPROT 18.0*  INR 1.4*   Recent Labs    01/17/24 1715  COLORURINE YELLOW  LABSPEC 1.015  PHURINE 6.0  GLUCOSEU NEGATIVE  HGBUR SMALL*  BILIRUBINUR NEGATIVE  KETONESUR NEGATIVE  PROTEINUR 100*  NITRITE NEGATIVE  LEUKOCYTESUR NEGATIVE       Component Value Date/Time   CHOL 137 06/11/2023 1156   TRIG 84.0 06/11/2023 1156   HDL 42.40 06/11/2023 1156   CHOLHDL 3 06/11/2023 1156   VLDL 16.8 06/11/2023 1156   LDLCALC 78 06/11/2023 1156   Lab Results   Component Value Date   HGBA1C 6.2 11/12/2023   No results found for: LABOPIA, COCAINSCRNUR, LABBENZ, AMPHETMU, THCU, LABBARB  Recent Labs  Lab 01/17/24 1715  ETH <15    I have personally reviewed the radiological images below and agree with the radiology interpretations.  ECHOCARDIOGRAM COMPLETE Result Date: 01/18/2024    ECHOCARDIOGRAM REPORT   Patient Name:   Calvin Burnett Date of Exam: 01/18/2024 Medical Rec #:  [id number]       Height:       70.0 in Accession #:    [id number]      Weight:       151.7 lb Date of Birth:  1933/09/27       BSA:          1.856 m Patient Age:    89 years        BP:           157/84 mmHg Patient Gender: M               HR:           64 bpm. Exam  Location:  Inpatient Procedure: 2D Echo, 3D Echo, Cardiac Doppler, Color Doppler and Strain Analysis            (Both Spectral and Color Flow Doppler were utilized during            procedure). Indications:    Aortic stenosis  History:        Patient has prior history of Echocardiogram examinations, most                 recent 07/07/2023. Aortic Valve Disease, Arrythmias:Atrial                 Fibrillation; Risk Factors:Hypertension, Diabetes and                 Dyslipidemia.  Sonographer:    Therisa Crouch Referring Phys: [id number] JONATHAN SEGARS IMPRESSIONS  1. Left ventricular ejection fraction, by estimation, is 50 to 55%. The left ventricle has low normal function. The left ventricle has no regional wall motion abnormalities. Left ventricular diastolic parameters are indeterminate.  2. Right ventricular systolic function is mildly reduced. The right ventricular size is normal. Tricuspid regurgitation signal is inadequate for assessing PA pressure.  3. Left atrial size was severely dilated.  4. Right atrial size was severely dilated.  5. The mitral valve is degenerative. Mild to moderate mitral valve regurgitation. Moderate mitral annular calcification.  6. The aortic valve is tricuspid. There is moderate calcification  of the aortic valve. Aortic valve regurgitation is mild. Moderate to severe aortic valve stenosis. Vmax 3.4 m/s, MG , AVA 1.0 cm^2, DI 0.3  7. The inferior vena cava is normal in size with <50% respiratory variability, suggesting right atrial pressure of 8 mmHg. FINDINGS  Left Ventricle: Left ventricular ejection fraction, by estimation, is 50 to 55%. The left ventricle has low normal function. The left ventricle has no regional wall motion abnormalities. The left ventricular internal cavity size was normal in size. There is no left ventricular hypertrophy. Left ventricular diastolic parameters are indeterminate. Right Ventricle: The right ventricular size is normal. No increase in right ventricular wall thickness. Right ventricular systolic function is mildly reduced. Tricuspid regurgitation signal is inadequate for assessing PA pressure. Left Atrium: Left atrial size was severely dilated. Right Atrium: Right atrial size was severely dilated. Pericardium: There is no evidence of pericardial effusion. Mitral Valve: The mitral valve is degenerative in appearance. Moderate mitral annular calcification. Mild to moderate mitral valve regurgitation. Tricuspid Valve: The tricuspid valve is normal in structure. Tricuspid valve regurgitation is mild. Aortic Valve: The aortic valve is tricuspid. There is moderate calcification of the aortic valve. Aortic valve regurgitation is mild. Moderate aortic stenosis is present. Aortic valve mean gradient measures 20.0 mmHg. Aortic valve peak gradient measures 37.1 mmHg. Aortic valve area, by VTI measures 1.12 cm. Pulmonic Valve: The pulmonic valve was not well visualized. Pulmonic valve regurgitation is trivial. Aorta: The aortic root and ascending aorta are structurally normal, with no evidence of dilitation. Venous: The inferior vena cava is normal in size with less than 50% respiratory variability, suggesting right atrial pressure of 8 mmHg. IAS/Shunts: The interatrial  septum was not well visualized.  LEFT VENTRICLE PLAX 2D LVIDd:         5.10 cm LVIDs:         3.70 cm LV PW:         0.90 cm   3D Volume EF LV IVS:        0.80 cm   LV 3D EDV:  83.54 ml LVOT diam:     2.10 cm   LV 3D ESV:   30.30 ml LV SV:         75 LV SV Index:   40 LVOT Area:     3.46 cm  RIGHT VENTRICLE            IVC RV Basal diam:  3.50 cm    IVC diam: 1.80 cm RV S prime:     9.14 cm/s TAPSE (M-mode): 1.5 cm LEFT ATRIUM              Index        RIGHT ATRIUM           Index LA diam:        4.80 cm  2.59 cm/m   RA Area:     27.70 cm LA Vol (A2C):   68.7 ml  37.02 ml/m  RA Volume:   96.50 ml  52.00 ml/m LA Vol (A4C):   133.0 ml 71.67 ml/m LA Biplane Vol: 100.0 ml 53.88 ml/m  AORTIC VALVE AV Area (Vmax):    1.15 cm AV Area (Vmean):   1.16 cm AV Area (VTI):     1.12 cm AV Vmax:           304.67 cm/s AV Vmean:          204.000 cm/s AV VTI:            0.669 m AV Peak Grad:      37.1 mmHg AV Mean Grad:      20.0 mmHg LVOT Vmax:         101.15 cm/s LVOT Vmean:        68.100 cm/s LVOT VTI:          0.216 m LVOT/AV VTI ratio: 0.32  AORTA Ao Root diam: 3.70 cm Ao Asc diam:  3.50 cm  SHUNTS Systemic VTI:  0.22 m Systemic Diam: 2.10 cm Lonni Nanas MD Electronically signed by Lonni Nanas MD Signature Date/Time: 01/18/2024/6:41:30 PM    Final    CT ANGIO HEAD NECK W WO CM Result Date: 01/18/2024 CLINICAL DATA:  89 year old male with abnormal brain MRI 0048 hours today. Acute and chronic intracranial hemorrhage including microhemorrhages, subarachnoid blood. Hypertension and status post fall on Eliquis . Furthermore patient is currently being treated for dementia with NMDA antagonist. EXAM: CT ANGIOGRAPHY HEAD AND NECK WITH AND WITHOUT CONTRAST TECHNIQUE: Multidetector CT imaging of the head and neck was performed using the standard protocol during bolus administration of intravenous contrast. Multiplanar CT image reconstructions and MIPs were obtained to evaluate the vascular anatomy. Carotid  stenosis measurements (when applicable) are obtained utilizing NASCET criteria, using the distal internal carotid diameter as the denominator. RADIATION DOSE REDUCTION: This exam was performed according to the departmental dose-optimization program which includes automated exposure control, adjustment of the mA and/or kV according to patient size and/or use of iterative reconstruction technique. CONTRAST:  75mL OMNIPAQUE  IOHEXOL  350 MG/ML SOLN COMPARISON:  Brain MRI 0048 hours today, head CT yesterday. FINDINGS: CT HEAD Brain: Multiple subcentimeter small hyperdense intra-axial hemorrhages (series 4, image 13) appears stable from the head CT yesterday. Left posterior convexity subarachnoid hemorrhage or effusion by MRI is largely occult by CT. No new or progressed intracranial hemorrhage identified. No IVH or ventriculomegaly. No intracranial mass effect or midline shift. Stable gray-white matter differentiation throughout the brain., including right temporal lobe encephalomalacia. Calvarium and skull base: Stable.  No skull fracture identified. Paranasal sinuses: Stable, well aerated. Orbits:  No discrete orbit or scalp soft tissue injury identified. CTA NECK Skeleton: Advanced chronic cervical spine disc and endplate degeneration. No acute osseous abnormality identified. Upper chest: Negative. Other neck: Negative nonvascular neck soft tissue spaces. Aortic arch: Probable 3 vessel arch but most of the aortic arch is not included. Distal arch atherosclerosis. Right carotid system: Patent with soft and calcified plaque but no significant stenosis. Left carotid system: Patent with soft and calcified plaque. Moderate atherosclerosis at the lateral left ICA origin and bulb. But less than 50 % stenosis with respect to the distal vessel there. Vertebral arteries: Proximal subclavian arteries are patent without stenosis. Left subclavian soft and calcified plaque. Normal right vertebral artery origin. Patent left vertebral  artery with soft and calcified plaque resulting in mild stenosis. Dominant left vertebral artery with no additional plaque or stenosis to the skull base. Patent non dominant right vertebral to the skull base. CTA HEAD Posterior circulation: Both vertebral arteries are patent to the vertebrobasilar junction. Dominant left V4 with mild plaque. Patent PICA origins. Patent basilar artery without stenosis. Patent SCA and left PCA origins. Fetal type right PCA origin. Left posterior communicating artery diminutive or absent. Patent bilateral PCA branches. Mild P2 irregularity greater on the right. Anterior circulation: Both ICA siphons are patent. Bilateral siphon calcified plaque, mild on the left. Moderate right siphon cavernous segment calcified plaque. No siphon stenosis. Normal right posterior communicating artery origin. Patent, mildly ectatic carotid termini. Normal MCA and ACA origins. Normal anterior communicating artery and bilateral ACA branches. Left MCA M1 segment is tortuous. Left MCA bifurcation is patent, mildly ectatic. Right MCA M1 segment and trifurcation are patent and tortuous. Bilateral MCA branches are within normal limits. Venous sinuses: Patent. Anatomic variants: Dominant left vertebral artery. Fetal right PCA origin. Review of the MIP images confirms the above findings IMPRESSION: 1. Unchanged several acute subcentimeter parenchymal brain hemorrhages by CT. And left posterior convexity SAH or subarachnoid effusion remains occult by CT. No intracranial mass effect. No new intracranial abnormality. 2. CTA is negative for large vessel occlusion or intracranial aneurysm. Widespread atherosclerosis in the head and neck, but no hemodynamically significant arterial stenosis. Electronically Signed   By: VEAR Hurst M.D.   On: 01/18/2024 12:20   EEG adult Result Date: 01/18/2024 Matthews Elida HERO, MD     01/18/2024  8:54 AM Routine EEG Report Calvin Burnett is a 89 y.o. male with a history of altered mental  status and subacute subarachnoid hemorrhage who is undergoing an EEG to evaluate for seizures. Report: This EEG was acquired with electrodes placed according to the International 10-20 electrode system (including Fp1, Fp2, F3, F4, C3, C4, P3, P4, O1, O2, T3, T4, T5, T6, A1, A2, Fz, Cz, Pz). The following electrodes were missing or displaced: none. The occipital dominant rhythm was 7 Hz. This activity is reactive to stimulation. Drowsiness was manifested by background fragmentation; deeper stages of sleep were not identified. There was no focal slowing. There were no interictal epileptiform discharges. There were no electrographic seizures identified. Photic stimulation and hyperventilation were not performed. Impression and clinical correlation: This EEG was obtained while awake and drowsy and is abnormal due to mild diffuse slowing indicative of global cerebral dysfunction. Epileptiform abnormalities were not seen during this recording. Elida Matthews, MD [facility] [phone] If 7pm- 7am, please page neurology on call as listed in AMION.   MR Brain W and Wo Contrast Result Date: 01/18/2024 CLINICAL DATA:  Initial evaluation for acute mental status change. EXAM: MRI  HEAD WITHOUT AND WITH CONTRAST TECHNIQUE: Multiplanar, multiecho pulse sequences of the brain and surrounding structures were obtained without and with intravenous contrast. CONTRAST:  7mL GADAVIST  GADOBUTROL  1 MMOL/ML IV SOLN COMPARISON:  CT from 01/17/2024 FINDINGS: Brain: Generalized age-related cerebral atrophy. Patchy T2/FLAIR hyperintensity involving the periventricular and deep white matter, most characteristic of chronic microvascular ischemic disease, mild for age. Encephalomalacia and gliosis involving the right temporal lobe, consistent with a chronic right MCA distribution infarct. Chronic hemosiderin staining present at this location. Additional small remote left frontal cortical infarct noted. No evidence for acute or  subacute infarct. Punctate focus of diffusion signal abnormality at the left occipital lobe felt to be related to adjacent susceptibility artifact (series 5, image 76). Multiple scattered foci of susceptibility artifact are seen involving the bilateral cerebral hemispheres, consistent with micro hemorrhages. A few of these appear to correspond with hyperdensity seen on prior head CT, consistent with small acute bleeds. Most notable of these measures 7 mm at the left occipital lobe (series 12, image 27). These foci are predominantly peripheral in location, with most of these foci new as compared to prior brain MRI from 12/04/2022. Additionally, there is abnormal sulcal FLAIR signal intensity with hemosiderin staining within the left temporal occipital region, concerning for subarachnoid hemorrhage (series 11, images 32-21). No appreciable subarachnoid blood seen on prior head CT. No mass lesion, midline shift, or significant mass effect. Mild ex vacuo dilatation of the right lateral ventricle without hydrocephalus. No extra-axial fluid collection. Pituitary gland and suprasellar region within normal limits. Asymmetric leptomeningeal enhancement seen within the left temporal occipital region, likely reactive in nature due to the underlying subarachnoid findings at these locations (series 17, image 6). No other abnormal enhancement. No findings to suggest metastatic disease. Vascular: Major intracranial vascular flow voids are maintained. Skull and upper cervical spine: Craniocervical junction within normal limits. No acute scalp soft tissue abnormality. Sinuses/Orbits: Prior bilateral ocular lens replacement. Globes or soft tissues demonstrate no acute finding. Mild scattered mucosal thickening present about the ethmoidal air cells. Paranasal sinuses are otherwise clear. No significant mastoid effusion. Other: None. IMPRESSION: 1. Multiple scattered foci of susceptibility artifact involving the bilateral cerebral  hemispheres, consistent with micro hemorrhages. A few of these appear to correspond with hyperdensities seen on prior head CT, consistent with small acute bleeds. Additionally, there is abnormal sulcal FLAIR signal intensity with hemosiderin staining within the left temporoccipital region, concerning for underlying subarachnoid hemorrhage, suspected to be subacute in nature as no hyperdense subarachnoid blood seen on prior head CT. Findings are nonspecific, with primary differential considerations including sequelae of cerebral amyloid angiopathy, trauma, or coagulopathy. 2. Asymmetric leptomeningeal enhancement within the left temporoccipital region, likely reactive in nature due to the underlying subarachnoid findings at these locations. 3. No other acute intracranial abnormality. 4. Chronic right MCA distribution infarct, with additional small remote left frontal cortical infarct. Electronically Signed   By: Morene Hoard M.D.   On: 01/18/2024 01:48   CT HEAD WO CONTRAST Addendum Date: 01/17/2024 ADDENDUM REPORT: 01/17/2024 18:20 ADDENDUM: These results were called by telephone at the time of interpretation on 01/17/2024 at 6:20 pm to provider [facility] , who verbally acknowledged these results. Electronically Signed   By: Morgane  Naveau M.D.   On: 01/17/2024 18:20   Result Date: 01/17/2024 CLINICAL DATA:  Head trauma, moderate-severe; Polytrauma, blunt. History of prostate cancer. EXAM: CT HEAD WITHOUT CONTRAST CT CERVICAL SPINE WITHOUT CONTRAST TECHNIQUE: Multidetector CT imaging of the head and cervical spine was performed following  the standard protocol without intravenous contrast. Multiplanar CT image reconstructions of the cervical spine were also generated. RADIATION DOSE REDUCTION: This exam was performed according to the departmental dose-optimization program which includes automated exposure control, adjustment of the mA and/or kV according to patient size and/or use of iterative  reconstruction technique. COMPARISON:  CT head 11/14/2022 FINDINGS: CT HEAD FINDINGS Brain: Patchy and confluent areas of decreased attenuation are noted throughout the deep and periventricular white matter of the cerebral hemispheres bilaterally, compatible with chronic microvascular ischemic disease. 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 No evidence of large-territorial acute infarction. Several foci of hyperdensity along the left occipital and temporal lobes best evaluated on sagittal and coronal view (5:38, 4:56) with these measuring 4-5 mm each. No mass lesion. No extra-axial collection. No mass effect or midline shift. No hydrocephalus. Basilar cisterns are patent. Vascular: No hyperdense vessel. Atherosclerotic calcifications are present within the cavernous internal carotid arteries. Skull: No acute fracture or focal lesion. Sinuses/Orbits: Paranasal sinuses and mastoid air cells are clear. The orbits are unremarkable. Other: None. CT CERVICAL SPINE FINDINGS Alignment: Normal. Skull base and vertebrae: Multilevel moderate degenerative changes of the spine. Associated severe left C4-C5 osseous neural foramina. No associated severe osseous central canal stenosis. No acute fracture. No aggressive appearing focal osseous lesion or focal pathologic process. Soft tissues and spinal canal: No prevertebral fluid or swelling. No visible canal hematoma. Upper chest: Unremarkable. Other: Atherosclerotic plaque of the carotid arteries within the neck. IMPRESSION: 1. Total of four 4-5 mm hyperdense foci along the left frontal, temporal, occipital lobes. Findings could represent intraparenchymal foci of hemorrhage versus underlying metastases. Recommend follow-up CT head in 4-6 hours. Consider MRI head with and without contrast for evaluation of underlying masses if clinically indicated. 2. No acute displaced fracture or traumatic listhesis of the cervical spine. 3. Severe left C4-C5 osseous  neural foramina. Electronically Signed: By: Morgane  Naveau M.D. On: 01/17/2024 18:17   CT CERVICAL SPINE WO CONTRAST Addendum Date: 01/17/2024 ADDENDUM REPORT: 01/17/2024 18:20 ADDENDUM: These results were called by telephone at the time of interpretation on 01/17/2024 at 6:20 pm to provider [facility] , who verbally acknowledged these results. Electronically Signed   By: Morgane  Naveau M.D.   On: 01/17/2024 18:20   Result Date: 01/17/2024 CLINICAL DATA:  Head trauma, moderate-severe; Polytrauma, blunt. History of prostate cancer. EXAM: CT HEAD WITHOUT CONTRAST CT CERVICAL SPINE WITHOUT CONTRAST TECHNIQUE: Multidetector CT imaging of the head and cervical spine was performed following the standard protocol without intravenous contrast. Multiplanar CT image reconstructions of the cervical spine were also generated. RADIATION DOSE REDUCTION: This exam was performed according to the departmental dose-optimization program which includes automated exposure control, adjustment of the mA and/or kV according to patient size and/or use of iterative reconstruction technique. COMPARISON:  CT head 11/14/2022 FINDINGS: CT HEAD FINDINGS Brain: Patchy and confluent areas of decreased attenuation are noted throughout the deep and periventricular white matter of the cerebral hemispheres bilaterally, compatible with chronic microvascular ischemic disease. 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 No evidence of large-territorial acute infarction. Several foci of hyperdensity along the left occipital and temporal lobes best evaluated on sagittal and coronal view (5:38, 4:56) with these measuring 4-5 mm each. No mass lesion. No extra-axial collection. No mass effect or midline shift. No hydrocephalus. Basilar cisterns are patent. Vascular: No hyperdense vessel. Atherosclerotic calcifications are present within the cavernous internal carotid arteries. Skull: No acute fracture or focal lesion.  Sinuses/Orbits: Paranasal sinuses and mastoid  air cells are clear. The orbits are unremarkable. Other: None. CT CERVICAL SPINE FINDINGS Alignment: Normal. Skull base and vertebrae: Multilevel moderate degenerative changes of the spine. Associated severe left C4-C5 osseous neural foramina. No associated severe osseous central canal stenosis. No acute fracture. No aggressive appearing focal osseous lesion or focal pathologic process. Soft tissues and spinal canal: No prevertebral fluid or swelling. No visible canal hematoma. Upper chest: Unremarkable. Other: Atherosclerotic plaque of the carotid arteries within the neck. IMPRESSION: 1. Total of four 4-5 mm hyperdense foci along the left frontal, temporal, occipital lobes. Findings could represent intraparenchymal foci of hemorrhage versus underlying metastases. Recommend follow-up CT head in 4-6 hours. Consider MRI head with and without contrast for evaluation of underlying masses if clinically indicated. 2. No acute displaced fracture or traumatic listhesis of the cervical spine. 3. Severe left C4-C5 osseous neural foramina. Electronically Signed: By: Morgane  Naveau M.D. On: 01/17/2024 18:17   DG Pelvis Portable Result Date: 01/17/2024 CLINICAL DATA:  Trauma, fall. EXAM: PORTABLE PELVIS 1-2 VIEWS COMPARISON:  04/16/2019. FINDINGS: There is no evidence of acute fracture or dislocation. Mild degenerative changes are noted at the hips bilaterally. There are degenerative changes in lower lumbar spine. Radiation therapy seeds are noted in the region of the prostate gland. IMPRESSION: No acute fracture or dislocation. Electronically Signed   By: Leita Birmingham M.D.   On: 01/17/2024 17:32   DG Chest Port 1 View Result Date: 01/17/2024 CLINICAL DATA:  Trauma.  Fall on blood thinners EXAM: PORTABLE CHEST 1 VIEW COMPARISON:  None Available. FINDINGS: Normal mediastinum and cardiac silhouette. Normal pulmonary vasculature. No evidence of effusion, infiltrate, or  pneumothorax. No acute bony abnormality. IMPRESSION: No acute cardiopulmonary process. Electronically Signed   By: Jackquline Boxer M.D.   On: 01/17/2024 17:32     PHYSICAL EXAM  Temp:  [97.5 F (36.4 C)-98.1 F (36.7 C)] 98.1 F (36.7 C) (07/07 0757) Pulse Rate:  [53-59] 59 (07/07 0757) Resp:  [18-20] 18 (07/07 0757) BP: (154-182)/(79-109) 182/86 (07/07 1201) SpO2:  [97 %-99 %] 97 % (07/07 0757)  General - Well nourished, well developed, in no apparent distress.  Ophthalmologic - fundi not visualized due to noncooperation.  Cardiovascular - irregularly irregular heart rate and rhythm.  Neuro - awake, alert, eyes open, orientated to age and people and year but not to place, month or situation. No aphasia, seems to have fluent language but not coherent and jumping content, able to follow all simple commands. Able to name 2/4 and repeat simple sentences. No gaze palsy, tracking bilaterally, visual field full. No facial droop but bilateral dressing at lips due to skin cancer removal. Tongue midline. Bilateral UEs 4/5, no drift. Bilaterally LEs 3/5 proximal and 4/5 distally. Sensation symmetrical bilaterally, b/l FTN intact, gait not tested.     ASSESSMENT/PLAN Calvin Burnett is a 89 y.o. male with history of DM, HTN, afib on eliquis , dementia on namenda , squamous cell carcinoma, admitted for fall, confusion and syncope. No TNK given due to ICH.    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 Old right temporal infarct MRI with and without contrast Multiple scattered foci of susceptibility artifact involving the bilateral cerebral hemispheres, consistent with micro hemorrhages. A few of these appear to correspond with hyperdensities seen on prior head CT, consistent with small acute bleeds. Additionally, there is abnormal sulcal FLAIR signal intensity with hemosiderin staining within  the left temporoccipital region, concerning for underlying subarachnoid hemorrhage, suspected to be subacute in nature. Findings are nonspecific, with primary differential considerations including sequelae of cerebral amyloid angiopathy, trauma, or coagulopathy. CTA head and neck unremarkable, Widespread atherosclerosis in the head and neck, but no hemodynamically significant arterial stenosis. 2D Echo EF 50-55% LDL 87 in 10/2023 HgbA1c 6.2 in 10/2023 SCDs for VTE prophylaxis Eliquis  (apixaban ) daily prior to admission, now on No antithrombotic given ICH and CAA Ongoing aggressive stroke risk factor management Therapy recommendations:  HH PT Disposition:  pending  CAA MRI concerning for CAA with acute small multifocal ICHs Currently off eliquis  Strict BP control < 140  Permanent afib Tele showed afib with controlled rate Was on eliquis  PTA No reversal needed given small and stable ICH Off eliquis  now  May consider cardiology referral for possible watchman device   Diabetes HgbA1c 6.2 goal < 7.0 Controlled CBG monitoring SSI DM education and close PCP follow up  Hypertension Home meds: norvasc  2.5, metoprolol  25, diovan  80 Stable on the high end Now on norvasc  5, avapro  75  BP goal < 140 given CAA Long term BP goal normotensive  Hyperlipidemia Home meds:  crestor  20  LDL 87, goal < 70 Now on crestor  20 Continue statin at discharge. Pt not SATURN candidate given dementia  Other Stroke Risk Factors Advanced age  Other Active Problems Dementia on namenda  - continue home meds Squamous cell carcinoma - b/l lip and facial dressing  Hospital day # 1   Ary Cummins, MD PhD Stroke Neurology 01/19/2024 1:19 PM       To contact Stroke Continuity provider, please refer to [email or web address]. After hours, contact General Neurology

## 2024-01-19 NOTE — Progress Notes (Signed)
 Re: Calvin Burnett DOB:01/10/1934 Date:01/19/2024   To Whom It May Concern:  Please be advised that the above-named patient will require a short-term nursing home stay--anticipated 30 days or less for rehabilitation and strengthening. The plan is for home.    Calvin Burnett DOB: 1934/02/26 Date:01/19/2024  To Whom It May Concern:  Please be advised that the above-named patient has a primary diagnosis of dementia which supersedes any psychiatric diagnosis.

## 2024-01-19 NOTE — TOC Initial Note (Signed)
 Transition of Care [facility]) - Initial/Assessment Note    Patient Details  Name: Calvin Burnett MRN: [id number] Date of Birth: 1934-03-28  [facility] CM/SW Contact:    Andrez JULIANNA George, RN Phone Number: 01/19/2024, 3:51 PM  Clinical Narrative:                  Pt is only oriented times 1. His brother is at the bedside. He states pt lives with his daughter but she works 4am to 2:30 pm. There is not family available for supervision during these hours.  Family currently prefers SNF rehab for the patient. They are agreeable to having him faxed out in the [facility] area. Awaiting bed offers.  TOC following.  Expected Discharge Plan: Skilled Nursing Facility Barriers to Discharge: Continued Medical Work up   Patient Goals and CMS Choice   CMS Medicare.gov Compare Post Acute Care list provided to:: Patient Represenative (must comment) Choice offered to / list presented to : Adult Children      Expected Discharge Plan and Services In-house Referral: Clinical Social Work Discharge Planning Services: CM Consult Post Acute Care Choice: Skilled Nursing Facility Living arrangements for the past 2 months: Single Family Home                                      Prior Living Arrangements/Services Living arrangements for the past 2 months: Single Family Home Lives with:: Adult Children Patient language and need for interpreter reviewed:: Yes          Care giver support system in place?: No (comment)   Criminal Activity/Legal Involvement Pertinent to Current Situation/Hospitalization: No - Comment as needed  Activities of Daily Living      Permission Sought/Granted                  Emotional Assessment Appearance:: Appears stated age   Affect (typically observed): Quiet Orientation: : Oriented to Self   Psych Involvement: No (comment)  Admission diagnosis:  SAH (subarachnoid hemorrhage) ([name]) [I60.9] Fall, initial encounter  [W19.XXXA] Intraparenchymal hematoma of brain ([name]) [S06.33AA] Silent micro-hemorrhage of brain [facility]) [I61.8] Patient Active Problem List   Diagnosis Date Noted   Intraparenchymal hematoma of brain ([name]) 01/18/2024   SAH (subarachnoid hemorrhage) ([name]) 01/18/2024   Elevated troponin 01/18/2024   Acute metabolic encephalopathy 01/18/2024   Hypokalemia 01/18/2024   History of CVA (cerebrovascular accident) 01/18/2024   Aortic stenosis 04/05/2021   BPPV (benign paroxysmal positional vertigo) 12/17/2019   Gout 10/28/2019   Intra-abdominal abscess ([name]) 04/24/2019   Aortic atherosclerosis ([name]) 01/29/2019   B12 deficiency 12/04/2017   Stricture of sigmoid s/p robotic sigmoidectomy 11/26/2017 11/26/2017   Chronic anticoagulation 11/26/2017   pT1pN0 colon cancer s/p robotic right colectomy 11/26/2017 10/02/2017   Right inguinal hernia 10/02/2017   Atrial fibrillation with slow ventricular response ([name]) 04/09/2017   Night sweats 09/16/2016   Major depressive disorder with single episode, in full remission ([name]) 07/29/2016   History of adenomatous polyp of colon 10/27/2014   Bowel habit changes 10/14/2014   Rectal pain 10/14/2014   Syncope 09/27/2014   Symptomatic bradycardia 09/27/2014   Chronic kidney disease, stage 3a ([name]) 06/01/2014   Hyperlipidemia associated with type 2 diabetes mellitus ([name]) 03/01/2014   Type 2 diabetes mellitus ([name]) 07/31/2010   LEG CRAMPS, NOCTURNAL 04/30/2010   Basal cell carcinoma 05/03/2009   Vascular dementia ([name]) 07/27/2008   Cervical spondylosis  without myelopathy 12/11/2007   GERD (gastroesophageal reflux disease) 11/05/2007   Essential hypertension 01/30/2007   PCP:  Katrinka Garnette KIDD, MD Pharmacy:   [phone] - [place], [place] - [address] AT [address] OF [facility] [address] [place] [address] Phone: [phone] Fax: [phone]  [facility] [place] [facility] [address] [place] [address] Phone: [phone] Fax: [phone]     Social Drivers of Health (SDOH) Social History: SDOH Screenings   Food Insecurity: No Food Insecurity (05/07/2022)  Housing: Low Risk  (05/07/2022)  Transportation Needs: No Transportation Needs (05/07/2022)  Utilities: Not At Risk (05/07/2022)  Depression (PHQ2-9): Low Risk  (11/12/2023)  Tobacco Use: Medium Risk (01/17/2024)   SDOH Interventions:     Readmission Risk Interventions     No data to display

## 2024-01-19 NOTE — Progress Notes (Signed)
 Physical Therapy Treatment Patient Details Name: Calvin Burnett MRN: [id number] DOB: [birth year] Today's Date: 01/19/2024   History of Present Illness Calvin Burnett is a 89 y.o. male who presented to the ED 01/17/24 after a fall. CT Head with 4  hyperdense foci in L frontal, temporal and occipital lobe. MRI Brain most consistent with cerebral amyloid angiopathy and noted subacute appearing SAH. EEG abnormal due to mild diffuse slowing indicative of global cerebral dysfunction. PMHx:  vascular dementia, CVA, A-fib on AC, mild aortic stenosis, HTN, T2DM, CKD 3, colon adenocarcinoma stage I s/p R hemicolectomy '19 with concominant sigmoidectomy for diverticular stricture, and GERD.    PT Comments  Pt continues to demonstrate deficits in balance, cognition, and mobility. Pt lives with daughter who works and does not have assist at home during that time. Patient will benefit from continued inpatient follow up therapy, <3 hours/day prior to return home.     If plan is discharge home, recommend the following: A little help with walking and/or transfers;A little help with bathing/dressing/bathroom;Assistance with cooking/housework;Direct supervision/assist for medications management;Direct supervision/assist for financial management;Help with stairs or ramp for entrance;Supervision due to cognitive status   Can travel by private vehicle     Yes  Equipment Recommendations  None recommended by PT    Recommendations for Other Services       Precautions / Restrictions Precautions Precautions: Fall Recall of Precautions/Restrictions: Impaired Restrictions Weight Bearing Restrictions Per Provider Order: No     Mobility  Bed Mobility Overal bed mobility: Needs Assistance Bed Mobility: Supine to Sit, Sit to Supine     Supine to sit: Contact guard, HOB elevated Sit to supine: Min assist   General bed mobility comments: Assist to bring feet back up into bed    Transfers Overall transfer level:  Needs assistance Equipment used: None, Rolling walker (2 wheels) Transfers: Sit to/from Stand Sit to Stand: Min assist           General transfer comment: Assist to power up from bed and stabilize. Verbal/tactile cues for hand placement when standing with walker    Ambulation/Gait Ambulation/Gait assistance: Contact guard assist, Min assist Gait Distance (Feet): 125 Feet Assistive device: Rolling walker (2 wheels), None Gait Pattern/deviations: Step-to pattern, Decreased step length - right, Decreased step length - left, Narrow base of support, Shuffle Gait velocity: decreased Gait velocity interpretation: <1.8 ft/sec, indicate of risk for recurrent falls   General Gait Details: Without assistive device required min assist for balance. With walker primarily CGA with occasional min assist for balance   Stairs             Wheelchair Mobility     Tilt Bed    Modified Rankin (Stroke Patients Only)       Balance Overall balance assessment: Needs assistance Sitting-balance support: No upper extremity supported, Feet supported Sitting balance-Leahy Scale: Fair     Standing balance support: No upper extremity supported, During functional activity Standing balance-Leahy Scale: Fair Standing balance comment: Min assist for any unsupported dynamic activity             High level balance activites: Side stepping, Backward walking              Communication Communication Communication: No apparent difficulties  Cognition Arousal: Alert Behavior During Therapy: WFL for tasks assessed/performed   PT - Cognitive impairments: History of cognitive impairments, Orientation, Memory, Problem solving, Safety/Judgement, Awareness   Orientation impairments: Situation, Time  PT - Cognition Comments: Vascular Dementia Following commands: Intact, Impaired Following commands impaired: Follows multi-step commands inconsistently, Follows one step  commands with increased time    Cueing Cueing Techniques: Verbal cues, Gestural cues  Exercises Other Exercises Other Exercises: Repeat sit to stand x 4 with min assist and using UE's Other Exercises: Backward stepping 5' x 3 with min assist Other Exercises: Standing and reach Other Exercises: Sidestepping    General Comments        Pertinent Vitals/Pain Pain Assessment Breathing: normal Negative Vocalization: none Facial Expression: smiling or inexpressive Body Language: relaxed Consolability: no need to console PAINAD Score: 0    Home Living                          Prior Function            PT Goals (current goals can now be found in the care plan section) Acute Rehab PT Goals Patient Stated Goal: Return Home Progress towards PT goals: Progressing toward goals    Frequency    Min 2X/week      PT Plan      Co-evaluation              AM-PAC PT 6 Clicks Mobility   Outcome Measure  Help needed turning from your back to your side while in a flat bed without using bedrails?: A Little Help needed moving from lying on your back to sitting on the side of a flat bed without using bedrails?: A Little Help needed moving to and from a bed to a chair (including a wheelchair)?: A Little Help needed standing up from a chair using your arms (e.g., wheelchair or bedside chair)?: A Little Help needed to walk in hospital room?: A Little Help needed climbing 3-5 steps with a railing? : A Lot 6 Click Score: 17    End of Session Equipment Utilized During Treatment: Gait belt Activity Tolerance: Patient tolerated treatment well Patient left: in bed;with call bell/phone within reach;with bed alarm set;with family/visitor present   PT Visit Diagnosis: Other abnormalities of gait and mobility (R26.89);Unsteadiness on feet (R26.81)     Time: 1729-1750 PT Time Calculation (min) (ACUTE ONLY): 21 min  Charges:    $Gait Training: 8-22 mins PT General  Charges $$ ACUTE PT VISIT: 1 Visit                     [facility] PT Acute Rehabilitation Services Office [phone]    [facility] ORN [facility] 01/19/2024, 6:09 PM

## 2024-01-19 NOTE — Progress Notes (Signed)
 Progress Note    Calvin Burnett   FMW:[id number]  DOB: 30-Aug-1933  DOA: 01/17/2024     1 PCP: Calvin Garnette KIDD, MD  Initial CC: syncope, confusion   Hospital Course: Calvin Burnett is an 89 yo male with PMH dementia, DMII, GERD, mild AS, afib, HTN, CKD3, colon cancer s/p R hemicolectomy 2019 and concomitant sigmoidectomy for diverticular stricture who was brought to the hospital for confusion.  He also had a witnessed syncopal episode when ambulating to the car before coming to the hospital.  Baseline dementia as noted and disoriented but can carry on conversation usually at baseline per daughter.  CT head noted hyperdense foci on the left brain concerning for hemorrhage. MRI brain was then performed and consistent with multiple areas of small acute bleeds involving the left frontal, temporal, occipital regions.  Findings also concerning for underlying cerebral amyloid angiopathy.  Chronic right MCA infarct and small left frontal infarct also noted.  SAH Cerebral angiopathy  Chronic CVA Acute on chronic encephalopathy - MRI brain confirming underlying small bleeds involving the left frontal, temporal, occipital region.  Noted to be subacute in nature and also concern for underlying cerebral amyloid angiopathy - Chronic right MCA infarct and small remote left frontal cortical infarct - Neurology also following on admission - EEG testing also ordered in setting of worsened confusion - Continue hydralazine  for pressure control - explained to his daughter about risk of resuming Eliquis .  She understands.  Also discussed with neurology, at most recommendation would be for baby aspirin if felt strongly by cardiology.  For now he is being referred for outpatient Watchman device   Syncope A-fib with slow VR - Unclear etiology of syncopal event however may be multifactorial from slow VR and SAH -Repeat echo does show worsening of his underlying aortic stenosis but appears moderate now from mild  previously.  Await further cardiology evaluation about resuming BB and/or if asa would be indicated if stopping Eliquis  vs just referral for Watchman - Continue telemetry, currently remains bradycardic in the 40s to 50s - Eventual check of orthostatic blood pressure when able - Continue holding Eliquis .  Hold Toprol    Acute myocardial injury No history of chest pain.  EKG with slow A-fib without acute ischemic changes.  High-sensitivity Trop 24 -> 22.  Suspect demand in setting of ?orthostatic BP drops, bradycardia. -Management directed at above   Hypokalemia Repleted   Vascular dementia:  - Holding Memantine  due to bradycardia; likely less of a culprit than bblocker. delirium precautions, fall precautions, scheduled melatonin.  Hx CVA - Noted was on DOAC per above which will be discontinued.  No antiplatelets in the setting of IPH.  - Continue home rosuvastatin   Aortic stenosis - previously mild on last echo from 07/07/23; new echo shows worsening AV mean gradient; the AVA seems unchanged; doubt severe enough to have caused/contributed to the syncope unless he was very volume depleted too - No further workup per cardiology unless he becomes symptomatic  Melanoma - Patient had recent facial surgery this week removed melanoma from roof of mouth; the sutures he has in place on his right and left mouth are from the surgery not trauma from fall  Hypertension - continue amlodipine  and ARB  DMII - home regimen metformin .  Not currently requiring SSI  CKD 3a - Baseline creatinine near 1.2 - 1.4. GFR 48-50  Colon adenocarcinoma stage I s/p R hemicolectomy '19 with concominant sigmoidectomy for diverticular stricture - noted outpatient surveillance  Mood d/o - Continue  home escitalopram   Glaucoma - Continue home GTTs  Interval History:  Mentation is better today compared to yesterday.  He was able to tell me his name, where he is, the president.  He thought the year was  56. Follows commands much easier today and strength is also better. Spoke with his daughter, Calvin Burnett on the phone this morning and updated her as well.   Old records reviewed in assessment of this patient  Antimicrobials:   DVT prophylaxis:  SCDs Start: 01/18/24 0326   Code Status:   Code Status: Full Code  Mobility Assessment (Last 72 Hours)     Mobility Assessment     Row Name 01/19/24 0745 01/18/24 1212 01/18/24 0900       Does patient have an order for bedrest or is patient medically unstable No - Continue assessment No - Continue assessment --     What is the highest level of mobility based on the progressive mobility assessment? Level 5 (Walks with assist in room/hall) - Balance while stepping forward/back and can walk in room with assist - Complete Level 5 (Walks with assist in room/hall) - Balance while stepping forward/back and can walk in room with assist - Complete Level 5 (Walks with assist in room/hall) - Balance while stepping forward/back and can walk in room with assist - Complete        Barriers to discharge: None Disposition Plan: Home HH orders placed: TBD Status is: Observation, needs inpatient  Objective: Blood pressure 133/76, pulse 74, temperature 97.9 F (36.6 C), temperature source Oral, resp. rate 14, height 5' 10 (1.778 m), weight 68.8 kg, SpO2 97%.  Examination:  Physical Exam Constitutional:      Comments: Much less confused appearing but still has a little.  Oriented to name, place, president.  Thought the year was 2015  HENT:     Head:     Comments: Sutures in place from recent oral surgery from removing melanoma    Mouth/Throat:     Mouth: Mucous membranes are dry.  Eyes:     Extraocular Movements: Extraocular movements intact.  Cardiovascular:     Rate and Rhythm: Bradycardia present. Rhythm irregular.  Pulmonary:     Effort: Pulmonary effort is normal. No respiratory distress.     Breath sounds: Normal breath sounds. No wheezing.   Abdominal:     General: Bowel sounds are normal. There is no distension.     Palpations: Abdomen is soft.     Tenderness: There is no abdominal tenderness.  Musculoskeletal:        General: Normal range of motion.     Cervical back: Normal range of motion and neck supple.  Skin:    General: Skin is warm and dry.  Neurological:     Mental Status: He is disoriented.      Consultants:  Neurology Cardiology  Procedures:    Data Reviewed: Results for orders placed or performed during the hospital encounter of 01/17/24 (from the past 24 hours)  TSH     Status: Abnormal   Collection Time: 01/19/24  4:31 AM  Result Value Ref Range   TSH 6.968 (H) 0.350 - 4.500 uIU/mL  Vitamin B12     Status: None   Collection Time: 01/19/24  4:31 AM  Result Value Ref Range   Vitamin B-12 352 180 - 914 pg/mL  Basic metabolic panel with GFR     Status: Abnormal   Collection Time: 01/19/24  4:31 AM  Result Value Ref Range  Sodium 137 135 - 145 mmol/L   Potassium 3.5 3.5 - 5.1 mmol/L   Chloride 103 98 - 111 mmol/L   CO2 22 22 - 32 mmol/L   Glucose, Bld 105 (H) 70 - 99 mg/dL   BUN 12 8 - 23 mg/dL   Creatinine, Ser 8.80 0.61 - 1.24 mg/dL   Calcium  9.1 8.9 - 10.3 mg/dL   GFR, Estimated 59 (L) >60 mL/min   Anion gap 12 5 - 15  CBC with Differential/Platelet     Status: None   Collection Time: 01/19/24  4:31 AM  Result Value Ref Range   WBC 8.2 4.0 - 10.5 K/uL   RBC 4.57 4.22 - 5.81 MIL/uL   Hemoglobin 14.6 13.0 - 17.0 g/dL   HCT 57.9 60.9 - 47.9 %   MCV 91.9 80.0 - 100.0 fL   MCH 31.9 26.0 - 34.0 pg   MCHC 34.8 30.0 - 36.0 g/dL   RDW 86.7 88.4 - 84.4 %   Platelets 171 150 - 400 K/uL   nRBC 0.0 0.0 - 0.2 %   Neutrophils Relative % 48 %   Neutro Abs 4.0 1.7 - 7.7 K/uL   Lymphocytes Relative 39 %   Lymphs Abs 3.2 0.7 - 4.0 K/uL   Monocytes Relative 9 %   Monocytes Absolute 0.7 0.1 - 1.0 K/uL   Eosinophils Relative 2 %   Eosinophils Absolute 0.2 0.0 - 0.5 K/uL   Basophils Relative 1 %    Basophils Absolute 0.1 0.0 - 0.1 K/uL   Immature Granulocytes 1 %   Abs Immature Granulocytes 0.04 0.00 - 0.07 K/uL  Magnesium      Status: None   Collection Time: 01/19/24  4:31 AM  Result Value Ref Range   Magnesium  1.8 1.7 - 2.4 mg/dL    I have reviewed pertinent nursing notes, vitals, labs, and images as necessary. I have ordered labwork to follow up on as indicated.  I have reviewed the last notes from staff over past 24 hours. I have discussed patient's care plan and test results with nursing staff, CM/SW, and other staff as appropriate.  Time spent: Greater than 50% of the 55 minute visit was spent in counseling/coordination of care for the patient as laid out in the A&P.   LOS: 1 day   Alm Apo, MD [name] 01/19/2024, 2:16 PM
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
## 2024-01-19 NOTE — NC FL2 (Signed)
 [place]  MEDICAID FL2 LEVEL OF CARE FORM     IDENTIFICATION  Patient Name: Calvin Burnett Birthdate: [birth year] Sex: male Admission Date (Current Location): 01/17/2024  [facility] and [place] Number:  Producer, television/film/video and Address:  [facility] [place], [address], [place], [place] [address]      Provider Number: [id number]  Attending Physician Name and Address:  Patsy Lenis, MD  Relative Name and Phone Number:  Yvone Railing Daughter [phone]  [phone]    Current Level of Care: Hospital Recommended Level of Care: Skilled Nursing Facility Prior Approval Number:    Date Approved/Denied:   PASRR Number: pending  Discharge Plan: SNF    Current Diagnoses: Patient Active Problem List   Diagnosis Date Noted   Intraparenchymal hematoma of brain ([name]) 01/18/2024   SAH (subarachnoid hemorrhage) ([name]) 01/18/2024   Elevated troponin 01/18/2024   Acute metabolic encephalopathy 01/18/2024   Hypokalemia 01/18/2024   History of CVA (cerebrovascular accident) 01/18/2024   Aortic stenosis 04/05/2021   BPPV (benign paroxysmal positional vertigo) 12/17/2019   Gout 10/28/2019   Intra-abdominal abscess ([name]) 04/24/2019   Aortic atherosclerosis ([name]) 01/29/2019   B12 deficiency 12/04/2017   Stricture of sigmoid s/p robotic sigmoidectomy 11/26/2017 11/26/2017   Chronic anticoagulation 11/26/2017   pT1pN0 colon cancer s/p robotic right colectomy 11/26/2017 10/02/2017   Right inguinal hernia 10/02/2017   Atrial fibrillation with slow ventricular response ([name]) 04/09/2017   Night sweats 09/16/2016   Major depressive disorder with single episode, in full remission ([name]) 07/29/2016   History of adenomatous polyp of colon 10/27/2014   Bowel habit changes 10/14/2014   Rectal pain 10/14/2014   Syncope 09/27/2014   Symptomatic bradycardia 09/27/2014   Chronic kidney disease, stage 3a ([name]) 06/01/2014   Hyperlipidemia associated with type 2 diabetes mellitus  ([name]) 03/01/2014   Type 2 diabetes mellitus ([name]) 07/31/2010   LEG CRAMPS, NOCTURNAL 04/30/2010   Basal cell carcinoma 05/03/2009   Vascular dementia ([name]) 07/27/2008   Cervical spondylosis without myelopathy 12/11/2007   GERD (gastroesophageal reflux disease) 11/05/2007   Essential hypertension 01/30/2007    Orientation RESPIRATION BLADDER Height & Weight     Self  Normal Continent Weight: 68.8 kg Height:  5' 10 (177.8 cm)  BEHAVIORAL SYMPTOMS/MOOD NEUROLOGICAL BOWEL NUTRITION STATUS      Continent Diet (Regular with thin liquids)  AMBULATORY STATUS COMMUNICATION OF NEEDS Skin   Limited Assist Verbally Other (Comment) (Abrasions to face/ head/ arms--Bruising to arms/ neck/ face/ lip)                       Personal Care Assistance Level of Assistance  Bathing, Feeding, Dressing Bathing Assistance: Maximum assistance Feeding assistance: Limited assistance Dressing Assistance: Maximum assistance     Functional Limitations Info  Sight, Hearing, Speech Sight Info: Impaired Hearing Info: Adequate Speech Info: Adequate    SPECIAL CARE FACTORS FREQUENCY  PT (By licensed PT), OT (By licensed OT)     PT Frequency: 5x/wk OT Frequency: 5x/wk            Contractures Contractures Info: Not present    Additional Factors Info  Code Status, Allergies, Psychotropic Code Status Info: Full Allergies Info: Kcentra  Psychotropic Info: lexapro  5 mg daily/ Melatonin 6mg  at bedtime/ Namenda  XR 28 mg daily         Current Medications (01/19/2024):  This is the current hospital active medication list Current Facility-Administered Medications  Medication Dose Route Frequency Provider Last Rate Last Admin   acetaminophen  (  TYLENOL ) tablet 1,000 mg  1,000 mg Oral Q6H PRN Segars, Jonathan, MD   1,000 mg at 01/18/24 1758   albuterol  (PROVENTIL ) (2.5 MG/3ML) 0.083% nebulizer solution 2.5 mg  2.5 mg Nebulization Q4H PRN Segars, Jonathan, MD       amLODipine  (NORVASC ) tablet 5 mg  5 mg  Oral Daily Jerri Pfeiffer, MD   5 mg at 01/19/24 1023   artificial tears ophthalmic solution 2 drop  2 drop Both Eyes PRN Patsy Lenis, MD       escitalopram  (LEXAPRO ) tablet 5 mg  5 mg Oral Daily Segars, Jonathan, MD   5 mg at 01/19/24 1022   hydrALAZINE  (APRESOLINE ) injection 10 mg  10 mg Intravenous Q4H PRN Patsy Lenis, MD   10 mg at 01/19/24 1201   irbesartan  (AVAPRO ) tablet 75 mg  75 mg Oral Daily Patsy Lenis, MD   75 mg at 01/19/24 1025   latanoprost  (XALATAN ) 0.005 % ophthalmic solution 1 drop  1 drop Both Eyes QHS Segars, Jonathan, MD   1 drop at 01/18/24 2230   melatonin tablet 6 mg  6 mg Oral QHS PRN Segars, Jonathan, MD       melatonin tablet 6 mg  6 mg Oral QHS Segars, Jonathan, MD   6 mg at 01/18/24 2122   memantine  (NAMENDA  XR) 24 hr capsule 28 mg  28 mg Oral Daily Jerri Pfeiffer, MD   28 mg at 01/19/24 1025   ondansetron  (ZOFRAN ) injection 4 mg  4 mg Intravenous Q6H PRN Segars, Jonathan, MD       polyethylene glycol (MIRALAX  / GLYCOLAX ) packet 17 g  17 g Oral Daily PRN Segars, Jonathan, MD       rosuvastatin  (CRESTOR ) tablet 20 mg  20 mg Oral Once per day on Monday Thursday Keturah Carrier, MD   20 mg at 01/19/24 1027   sodium chloride  flush (NS) 0.9 % injection 3 mL  3 mL Intravenous Q12H Segars, Jonathan, MD   3 mL at 01/19/24 1206     Discharge Medications: Please see discharge summary for a list of discharge medications.  Relevant Imaging Results:  Relevant Lab Results:   Additional Information SS#: [id number]  Andrez JULIANNA George, RN

## 2024-01-20 DIAGNOSIS — T45515A Adverse effect of anticoagulants, initial encounter: Secondary | ICD-10-CM | POA: Diagnosis not present

## 2024-01-20 DIAGNOSIS — I611 Nontraumatic intracerebral hemorrhage in hemisphere, cortical: Secondary | ICD-10-CM | POA: Diagnosis not present

## 2024-01-20 DIAGNOSIS — E859 Amyloidosis, unspecified: Secondary | ICD-10-CM | POA: Diagnosis not present

## 2024-01-20 DIAGNOSIS — I609 Nontraumatic subarachnoid hemorrhage, unspecified: Secondary | ICD-10-CM | POA: Diagnosis not present

## 2024-01-20 DIAGNOSIS — G9341 Metabolic encephalopathy: Secondary | ICD-10-CM | POA: Diagnosis not present

## 2024-01-20 DIAGNOSIS — R55 Syncope and collapse: Secondary | ICD-10-CM | POA: Diagnosis not present

## 2024-01-20 DIAGNOSIS — I4891 Unspecified atrial fibrillation: Secondary | ICD-10-CM | POA: Diagnosis not present

## 2024-01-20 LAB — BASIC METABOLIC PANEL WITH GFR
Anion gap: 10 (ref 5–15)
BUN: 17 mg/dL (ref 8–23)
CO2: 24 mmol/L (ref 22–32)
Calcium: 8.8 mg/dL — ABNORMAL LOW (ref 8.9–10.3)
Chloride: 104 mmol/L (ref 98–111)
Creatinine, Ser: 1.34 mg/dL — ABNORMAL HIGH (ref 0.61–1.24)
GFR, Estimated: 51 mL/min — ABNORMAL LOW (ref >60)
Glucose, Bld: 108 mg/dL — ABNORMAL HIGH (ref 70–99)
Potassium: 3.9 mmol/L (ref 3.5–5.1)
Sodium: 138 mmol/L (ref 135–145)

## 2024-01-20 LAB — MAGNESIUM: Magnesium: 2 mg/dL (ref 1.7–2.4)

## 2024-01-20 LAB — CBC WITH DIFFERENTIAL/PLATELET
Abs Immature Granulocytes: 0.03 K/uL (ref 0.00–0.07)
Basophils Absolute: 0.1 K/uL (ref 0.0–0.1)
Basophils Relative: 1 %
Eosinophils Absolute: 0.2 K/uL (ref 0.0–0.5)
Eosinophils Relative: 3 %
HCT: 39.9 % (ref 39.0–52.0)
Hemoglobin: 13.9 g/dL (ref 13.0–17.0)
Immature Granulocytes: 1 %
Lymphocytes Relative: 24 %
Lymphs Abs: 1.5 K/uL (ref 0.7–4.0)
MCH: 31.5 pg (ref 26.0–34.0)
MCHC: 34.8 g/dL (ref 30.0–36.0)
MCV: 90.5 fL (ref 80.0–100.0)
Monocytes Absolute: 0.5 K/uL (ref 0.1–1.0)
Monocytes Relative: 8 %
Neutro Abs: 4.1 K/uL (ref 1.7–7.7)
Neutrophils Relative %: 63 %
Platelets: 156 K/uL (ref 150–400)
RBC: 4.41 MIL/uL (ref 4.22–5.81)
RDW: 13.2 % (ref 11.5–15.5)
WBC: 6.3 K/uL (ref 4.0–10.5)
nRBC: 0 % (ref 0.0–0.2)

## 2024-01-20 LAB — T4, FREE: Free T4: 1.22 ng/dL — ABNORMAL HIGH (ref 0.61–1.12)

## 2024-01-20 NOTE — Plan of Care (Signed)

## 2024-01-20 NOTE — Progress Notes (Signed)
 Progress Note    Calvin Burnett   FMW:[id number]  DOB: 1933/12/18  DOA: 01/17/2024     2 PCP: Katrinka Garnette KIDD, MD  Initial CC: syncope, confusion   Hospital Course: Calvin Burnett is an 89 yo male with PMH dementia, DMII, GERD, mild AS, afib, HTN, CKD3, colon cancer s/p R hemicolectomy 2019 and concomitant sigmoidectomy for diverticular stricture who was brought to the hospital for confusion.  He also had a witnessed syncopal episode when ambulating to the car before coming to the hospital.  Baseline dementia as noted and disoriented but can carry on conversation usually at baseline per daughter.  CT head noted hyperdense foci on the left brain concerning for hemorrhage. MRI brain was then performed and consistent with multiple areas of small acute bleeds involving the left frontal, temporal, occipital regions.  Findings also concerning for underlying cerebral amyloid angiopathy.  Chronic right MCA infarct and small left frontal infarct also noted.  SAH Cerebral angiopathy  Chronic CVA Acute on chronic encephalopathy - MRI brain confirming underlying small bleeds involving the left frontal, temporal, occipital region.  Noted to be subacute in nature and also concern for underlying cerebral amyloid angiopathy - Chronic right MCA infarct and small remote left frontal cortical infarct - Neurology also following on admission - EEG testing also ordered in setting of worsened confusion - Continue hydralazine  for pressure control - explained to his daughter about risk of resuming Eliquis .  She understands.  - After discussion with neurology, he would be a candidate for short-term anticoagulation or DAPT.  Timing of this would need to be discussed with cardiology upon timing of watchman consideration (if a candidate).  This can be coordinated at outpatient follow-up   Syncope A-fib with slow VR - Unclear etiology of syncopal event however may be multifactorial from slow VR and SAH -Repeat echo  does show worsening of his underlying aortic stenosis but appears moderate now from mild previously - Cardiology does not suspect that stenosis contributing to syncope -Heart rate slowly improving while holding beta-blocker.  May need to resume at low-dose at discharge or continue holding and follow-up heart rate at upcoming EP appointment - Continue telemetry; bradycardia has improved from the 40s to 50s now up to the mid 60s - Eventual check of orthostatic blood pressure when able - Continue holding Eliquis .  Hold Toprol    Acute myocardial injury No history of chest pain.  EKG with slow A-fib without acute ischemic changes.  High-sensitivity Trop 24 -> 22.  Suspect demand in setting of ?orthostatic BP drops, bradycardia. -Management directed at above   Hypokalemia Repleted   Vascular dementia:  - Holding Memantine  due to bradycardia; likely less of a culprit than bblocker. delirium precautions, fall precautions, scheduled melatonin.  Hx CVA - Noted was on DOAC per above which will be discontinued.  No antiplatelets in the setting of IPH.  - Continue home rosuvastatin   Aortic stenosis - previously mild on last echo from 07/07/23; new echo shows worsening AV mean gradient; the AVA seems unchanged; doubt severe enough to have caused/contributed to the syncope unless he was very volume depleted too - No further workup per cardiology unless he becomes symptomatic  Melanoma - Patient had recent facial surgery this week removed melanoma from roof of mouth; the sutures he has in place on his right and left mouth are from the surgery not trauma from fall  Hypertension - continue amlodipine  and ARB  DMII - home regimen metformin .  Not currently requiring SSI  CKD 3a - Baseline creatinine near 1.2 - 1.4. GFR 48-50  Colon adenocarcinoma stage I s/p R hemicolectomy '19 with concominant sigmoidectomy for diverticular stricture - noted outpatient surveillance  Mood d/o - Continue home  escitalopram   Glaucoma - Continue home GTTs  Interval History:  Mentation continues to improve daily.  Oriented now to name, place, year, president, situation. PT recommending SNF, awaiting placement. Outpatient follow-up scheduled with EP for watchman evaluation.   Old records reviewed in assessment of this patient  Antimicrobials:   DVT prophylaxis:  SCDs Start: 01/18/24 0326   Code Status:   Code Status: Full Code  Mobility Assessment (Last 72 Hours)     Mobility Assessment     Row Name 01/20/24 0800 01/19/24 2000 01/19/24 1759 01/19/24 0745 01/18/24 1212   Does patient have an order for bedrest or is patient medically unstable No - Continue assessment No - Continue assessment -- No - Continue assessment No - Continue assessment   What is the highest level of mobility based on the progressive mobility assessment? Level 4 (Walks with assist in room) - Balance while marching in place and cannot step forward and back - Complete Level 4 (Walks with assist in room) - Balance while marching in place and cannot step forward and back - Complete Level 5 (Walks with assist in room/hall) - Balance while stepping forward/back and can walk in room with assist - Complete Level 5 (Walks with assist in room/hall) - Balance while stepping forward/back and can walk in room with assist - Complete Level 5 (Walks with assist in room/hall) - Balance while stepping forward/back and can walk in room with assist - Complete    Row Name 01/18/24 0900           What is the highest level of mobility based on the progressive mobility assessment? Level 5 (Walks with assist in room/hall) - Balance while stepping forward/back and can walk in room with assist - Complete          Barriers to discharge: None Disposition Plan: SNF HH orders placed: n/a Status is: Inpt  Objective: Blood pressure (!) 141/72, pulse 79, temperature 97.6 F (36.4 C), temperature source Oral, resp. rate 18, height 5' 10  (1.778 m), weight 68.8 kg, SpO2 95%.  Examination:  Physical Exam Constitutional:      Comments: Mentation improved and now completely oriented to name, place, year, pres  HENT:     Head:     Comments: Sutures in place from recent oral surgery from removing melanoma    Mouth/Throat:     Mouth: Mucous membranes are dry.  Eyes:     Extraocular Movements: Extraocular movements intact.  Cardiovascular:     Rate and Rhythm: Bradycardia present. Rhythm irregular.  Pulmonary:     Effort: Pulmonary effort is normal. No respiratory distress.     Breath sounds: Normal breath sounds. No wheezing.  Abdominal:     General: Bowel sounds are normal. There is no distension.     Palpations: Abdomen is soft.     Tenderness: There is no abdominal tenderness.  Musculoskeletal:        General: Normal range of motion.     Cervical back: Normal range of motion and neck supple.  Skin:    General: Skin is warm and dry.  Neurological:     General: No focal deficit present.     Mental Status: He is oriented to person, place, and time.      Consultants:  Neurology  Cardiology  Procedures:    Data Reviewed: Results for orders placed or performed during the hospital encounter of 01/17/24 (from the past 24 hours)  T4, free     Status: Abnormal   Collection Time: 01/20/24  4:54 AM  Result Value Ref Range   Free T4 1.22 (H) 0.61 - 1.12 ng/dL  Basic metabolic panel with GFR     Status: Abnormal   Collection Time: 01/20/24  4:54 AM  Result Value Ref Range   Sodium 138 135 - 145 mmol/L   Potassium 3.9 3.5 - 5.1 mmol/L   Chloride 104 98 - 111 mmol/L   CO2 24 22 - 32 mmol/L   Glucose, Bld 108 (H) 70 - 99 mg/dL   BUN 17 8 - 23 mg/dL   Creatinine, Ser 8.65 (H) 0.61 - 1.24 mg/dL   Calcium  8.8 (L) 8.9 - 10.3 mg/dL   GFR, Estimated 51 (L) >60 mL/min   Anion gap 10 5 - 15  CBC with Differential/Platelet     Status: None   Collection Time: 01/20/24  4:54 AM  Result Value Ref Range   WBC 6.3 4.0 - 10.5  K/uL   RBC 4.41 4.22 - 5.81 MIL/uL   Hemoglobin 13.9 13.0 - 17.0 g/dL   HCT 60.0 60.9 - 47.9 %   MCV 90.5 80.0 - 100.0 fL   MCH 31.5 26.0 - 34.0 pg   MCHC 34.8 30.0 - 36.0 g/dL   RDW 86.7 88.4 - 84.4 %   Platelets 156 150 - 400 K/uL   nRBC 0.0 0.0 - 0.2 %   Neutrophils Relative % 63 %   Neutro Abs 4.1 1.7 - 7.7 K/uL   Lymphocytes Relative 24 %   Lymphs Abs 1.5 0.7 - 4.0 K/uL   Monocytes Relative 8 %   Monocytes Absolute 0.5 0.1 - 1.0 K/uL   Eosinophils Relative 3 %   Eosinophils Absolute 0.2 0.0 - 0.5 K/uL   Basophils Relative 1 %   Basophils Absolute 0.1 0.0 - 0.1 K/uL   Immature Granulocytes 1 %   Abs Immature Granulocytes 0.03 0.00 - 0.07 K/uL  Magnesium      Status: None   Collection Time: 01/20/24  4:54 AM  Result Value Ref Range   Magnesium  2.0 1.7 - 2.4 mg/dL    I have reviewed pertinent nursing notes, vitals, labs, and images as necessary. I have ordered labwork to follow up on as indicated.  I have reviewed the last notes from staff over past 24 hours. I have discussed patient's care plan and test results with nursing staff, CM/SW, and other staff as appropriate.  Time spent: Greater than 50% of the 55 minute visit was spent in counseling/coordination of care for the patient as laid out in the A&P.   LOS: 2 days   Alm Apo, MD [name] 01/20/2024, 2:01 PM

## 2024-01-20 NOTE — Progress Notes (Addendum)
  Progress Note  Patient Name: Calvin Burnett Date of Encounter: 01/20/2024 [facility] Cardiologist: Redell Shallow, MD   Interval Summary   Sleeping comfortably with no complaints offered. Brother at bedside.   Vital Signs Vitals:   01/19/24 1727 01/19/24 2015 01/20/24 0357 01/20/24 0811  BP:  (!) 155/74 (!) 143/81 131/72  Pulse: 74 60 63 66  Resp: 12  14 18   Temp: 98.2 F (36.8 C) 97.8 F (36.6 C) 97.9 F (36.6 C) 98 F (36.7 C)  TempSrc: Oral Oral Oral Oral  SpO2: 97% 98% 99% 98%  Weight:      Height:        Intake/Output Summary (Last 24 hours) at 01/20/2024 0907 Last data filed at 01/20/2024 0810 Gross per 24 hour  Intake --  Output 650 ml  Net -650 ml      01/18/2024   12:12 PM 01/17/2024    6:10 PM 12/22/2023    9:01 AM  Last 3 Weights  Weight (lbs) 151 lb 10.8 oz 155 lb 155 lb 3.2 oz  Weight (kg) 68.8 kg 70.308 kg 70.398 kg      Telemetry/ECG  Afib rates 50s-70s - Personally Reviewed  Physical Exam  GEN: No acute distress.   Neck: No JVD Cardiac: iRRR, 2/6 mid peaking SEM Respiratory: Clear to auscultation bilaterally. GI: Soft, nontender, non-distended  MS: No edema  Assessment & Plan  #Intracranial hemorrhage - left multifocal small ICH, concerning for CAA in the setting of eliquis  use.  - it is unlikely the patient can resume anticoagulation given above diagnosis, however, AC remains indicated for atrial fibrillation embolic stroke risk reduction.  - I have arranged Watchman appointment per neuro, patient and family request on 7/29 to review candidacy with EP team. If unable to take anticoagulation, and TEE probe unable to be passed, he may not be a candidate, family and pt understand this. Would ask neuro to comment on any potential AC use in future or if it is strictly contraindicated.   #Permanent AF - rates controlled/SVR, no AV nodal blocking agents  - off eliquis  as above.   #Moderate AS - can be followed as outpatient, unlikely to be  contributor to syncope and was otherwise asymptomatic prior to admission. Valve opens moderately on independent review of echo.   From cardiac standpoint, no further inpt recommendations.  F/u has been arranged.      For questions or updates, please contact [facility] Please consult [email or web address] for contact info under       Signed, Donna Silverman A Ardine Iacovelli, MD

## 2024-01-20 NOTE — Plan of Care (Signed)
  Problem: Clinical Measurements: Goal: Respiratory complications will improve Outcome: Progressing Goal: Cardiovascular complication will be avoided Outcome: Progressing   Problem: Activity: Goal: Risk for activity intolerance will decrease Outcome: Progressing   Problem: Nutrition: Goal: Adequate nutrition will be maintained Outcome: Progressing   Problem: Coping: Goal: Level of anxiety will decrease Outcome: Progressing   Problem: Elimination: Goal: Will not experience complications related to bowel motility Outcome: Progressing Goal: Will not experience complications related to urinary retention Outcome: Progressing   Problem: Skin Integrity: Goal: Risk for impaired skin integrity will decrease Outcome: Progressing

## 2024-01-20 NOTE — Progress Notes (Signed)
 Occupational Therapy Treatment Patient Details Name: Calvin Burnett MRN: [id number] DOB: [birth year] Today's Date: 01/20/2024   History of present illness Calvin Burnett is a 89 y.o. male who presented to the ED 01/17/24 after a fall. CT Head with 4  hyperdense foci in L frontal, temporal and occipital lobe. MRI Brain most consistent with cerebral amyloid angiopathy and noted subacute appearing SAH. EEG abnormal due to mild diffuse slowing indicative of global cerebral dysfunction. PMHx:  vascular dementia, CVA, A-fib on AC, mild aortic stenosis, HTN, T2DM, CKD 3, colon adenocarcinoma stage I s/p R hemicolectomy '19 with concominant sigmoidectomy for diverticular stricture, and GERD.   OT comments  Pt in recliner, brother at side.  Pt oriented to self and place today, following simple commands but requires assist to sequence simple ADL tasks.  He requires min assist for transfers and mobility in room using RW, stood at sink to complete grooming with increased time and min assist.  Fatigues easily.  RN notified of headache and provided medication.  Updated dc plan to <3hrs/day inpatient setting as pt does not have 24/7 support.  Will follow acutely.       If plan is discharge home, recommend the following:  A little help with walking and/or transfers;A lot of help with bathing/dressing/bathroom;Direct supervision/assist for medications management;Direct supervision/assist for financial management;Assistance with cooking/housework;Assist for transportation;Help with stairs or ramp for entrance;Supervision due to cognitive status   Equipment Recommendations  BSC/3in1    Recommendations for Other Services      Precautions / Restrictions Precautions Precautions: Fall Recall of Precautions/Restrictions: Impaired Precaution/Restrictions Comments: BP < 140 Restrictions Weight Bearing Restrictions Per Provider Order: No       Mobility Bed Mobility Overal bed mobility: Needs Assistance              General bed mobility comments: OOB in recliner upon entry    Transfers Overall transfer level: Needs assistance Equipment used: Rolling walker (2 wheels) Transfers: Sit to/from Stand Sit to Stand: Min assist           General transfer comment: cueing for hand placement and safety, min assist to power up fully     Balance Overall balance assessment: Needs assistance Sitting-balance support: No upper extremity supported, Feet supported Sitting balance-Leahy Scale: Fair     Standing balance support: No upper extremity supported, During functional activity Standing balance-Leahy Scale: Fair                             ADL either performed or assessed with clinical judgement   ADL Overall ADL's : Needs assistance/impaired     Grooming: Minimal assistance;Oral care;Standing               Lower Body Dressing: Sit to/from stand;Moderate assistance   Toilet Transfer: Minimal assistance;Rolling walker (2 wheels)           Functional mobility during ADLs: Minimal assistance;Rolling walker (2 wheels);Cueing for sequencing;Cueing for safety      Extremity/Trunk Assessment              Vision       Perception     Praxis     Communication Communication Communication: No apparent difficulties   Cognition Arousal: Alert Behavior During Therapy: WFL for tasks assessed/performed Cognition: Cognition impaired   Orientation impairments: Time, Situation Awareness: Intellectual awareness impaired Memory impairment (select all impairments): Short-term memory, Working [employer] fast streamer, Non-declarative long-term memory, Geneticist, molecular long-term memory Attention impairment (select  first level of impairment): Sustained attention Executive functioning impairment (select all impairments): Initiation, Organization, Sequencing, Reasoning, Problem solving OT - Cognition Comments: pt oriented to self and place today, min cueing for sequencing of basic ADL tasks.  cueing required for safety                 Following commands: Impaired Following commands impaired: Follows multi-step commands inconsistently, Follows one step commands with increased time      Cueing   Cueing Techniques: Verbal cues, Gestural cues  Exercises      Shoulder Instructions       General Comments      Pertinent Vitals/ Pain       Pain Assessment Pain Assessment: 0-10 Pain Score: 2  Pain Location: headache Pain Descriptors / Indicators: Headache Pain Intervention(s): Limited activity within patient's tolerance, Monitored during session, Repositioned, Patient requesting pain meds-RN notified, RN gave pain meds during session  Home Living Family/patient expects to be discharged to:: Skilled nursing facility Living Arrangements: Children (lives with daughter)                                      Prior Functioning/Environment              Frequency  Min 2X/week        Progress Toward Goals  OT Goals(current goals can now be found in the care plan section)  Progress towards OT goals: Progressing toward goals  Acute Rehab OT Goals Patient Stated Goal: none stated OT Goal Formulation: Patient unable to participate in goal setting Time For Goal Achievement: 02/02/24 Potential to Achieve Goals: Good  Plan      Co-evaluation                 AM-PAC OT 6 Clicks Daily Activity     Outcome Measure   Help from another person eating meals?: A Little Help from another person taking care of personal grooming?: A Little Help from another person toileting, which includes using toliet, bedpan, or urinal?: A Lot Help from another person bathing (including washing, rinsing, drying)?: A Little Help from another person to put on and taking off regular upper body clothing?: A Little Help from another person to put on and taking off regular lower body clothing?: A Lot 6 Click Score: 16    End of Session Equipment Utilized During  Treatment: Rolling walker (2 wheels);Gait belt  OT Visit Diagnosis: Other abnormalities of gait and mobility (R26.89);History of falling (Z91.81);Other symptoms and signs involving cognitive function   Activity Tolerance Patient tolerated treatment well   Patient Left in chair;with call bell/phone within reach;with chair alarm set;with family/visitor present   Nurse Communication Mobility status        Time: 8942-8883 OT Time Calculation (min): 19 min  Charges: OT General Charges $OT Visit: 1 Visit OT Treatments $Self Care/Home Management : 8-22 mins  Etta NOVAK, OT Acute Rehabilitation Services Office [phone] Secure Chat Preferred    Etta GORMAN Hope 01/20/2024, 2:02 PM

## 2024-01-20 NOTE — Progress Notes (Addendum)
 STROKE TEAM PROGRESS NOTE   SUBJECTIVE (INTERVAL HISTORY) Sister is at the bedside.  Pt is assisted back to bed by RN. Cardiology has seen pt and plan for appointment as outpt to discuss about watchman device.    OBJECTIVE Temp:  [97.6 F (36.4 C)-98.6 F (37 C)] 98.6 F (37 C) (07/08 1531) Pulse Rate:  [60-79] 61 (07/08 1531) Cardiac Rhythm: Atrial fibrillation (07/08 0800) Resp:  [12-18] 17 (07/08 1531) BP: (127-155)/(59-81) 127/59 (07/08 1531) SpO2:  [95 %-99 %] 98 % (07/08 1531)  No results for input(s): GLUCAP in the last 168 hours. Recent Labs  Lab 01/17/24 1715 01/17/24 1735 01/18/24 0415 01/19/24 0431 01/20/24 0454  NA 142 144 140 137 138  K 3.5 3.5 3.2* 3.5 3.9  CL 105 104 105 103 104  CO2 28  --  26 22 24   GLUCOSE 109* 104* 180* 105* 108*  BUN 16 18 17 12 17   CREATININE 1.41* 1.40* 1.21 1.19 1.34*  CALCIUM  8.8*  --  8.6* 9.1 8.8*  MG  --   --  1.9 1.8 2.0  PHOS  --   --  3.5  --   --    Recent Labs  Lab 01/17/24 1715  AST 30  ALT 22  ALKPHOS 86  BILITOT 0.6  PROT 6.8  ALBUMIN  3.1*   Recent Labs  Lab 01/16/24 0547 01/17/24 1715 01/17/24 1735 01/18/24 0415 01/19/24 0431 01/20/24 0454  WBC 5.6 6.4  --  5.6 8.2 6.3  NEUTROABS 3.5  --   --   --  4.0 4.1  HGB 14.1 14.2 13.6 13.4 14.6 13.9  HCT 39.9 39.2 40.0 38.6* 42.0 39.9  MCV 90.7 91.4  --  91.7 91.9 90.5  PLT 145* 152  --  143* 171 156   No results for input(s): CKTOTAL, CKMB, CKMBINDEX, TROPONINI in the last 168 hours. Recent Labs    01/17/24 1715  LABPROT 18.0*  INR 1.4*   Recent Labs    01/17/24 1715  COLORURINE YELLOW  LABSPEC 1.015  PHURINE 6.0  GLUCOSEU NEGATIVE  HGBUR SMALL*  BILIRUBINUR NEGATIVE  KETONESUR NEGATIVE  PROTEINUR 100*  NITRITE NEGATIVE  LEUKOCYTESUR NEGATIVE       Component Value Date/Time   CHOL 137 06/11/2023 1156   TRIG 84.0 06/11/2023 1156   HDL 42.40 06/11/2023 1156   CHOLHDL 3 06/11/2023 1156   VLDL 16.8 06/11/2023 1156   LDLCALC 78  06/11/2023 1156   Lab Results  Component Value Date   HGBA1C 6.2 11/12/2023   No results found for: LABOPIA, COCAINSCRNUR, LABBENZ, AMPHETMU, THCU, LABBARB  Recent Labs  Lab 01/17/24 1715  ETH <15    I have personally reviewed the radiological images below and agree with the radiology interpretations.  ECHOCARDIOGRAM COMPLETE Result Date: 01/18/2024    ECHOCARDIOGRAM REPORT   Patient Name:   CHEVELLE DURR Date of Exam: 01/18/2024 Medical Rec #:  [id number]       Height:       70.0 in Accession #:    [id number]      Weight:       151.7 lb Date of Birth:  1934/07/12       BSA:          1.856 m Patient Age:    89 years        BP:           157/84 mmHg Patient Gender: M  HR:           64 bpm. Exam Location:  Inpatient Procedure: 2D Echo, 3D Echo, Cardiac Doppler, Color Doppler and Strain Analysis            (Both Spectral and Color Flow Doppler were utilized during            procedure). Indications:    Aortic stenosis  History:        Patient has prior history of Echocardiogram examinations, most                 recent 07/07/2023. Aortic Valve Disease, Arrythmias:Atrial                 Fibrillation; Risk Factors:Hypertension, Diabetes and                 Dyslipidemia.  Sonographer:    Therisa Crouch Referring Phys: [id number] JONATHAN SEGARS IMPRESSIONS  1. Left ventricular ejection fraction, by estimation, is 50 to 55%. The left ventricle has low normal function. The left ventricle has no regional wall motion abnormalities. Left ventricular diastolic parameters are indeterminate.  2. Right ventricular systolic function is mildly reduced. The right ventricular size is normal. Tricuspid regurgitation signal is inadequate for assessing PA pressure.  3. Left atrial size was severely dilated.  4. Right atrial size was severely dilated.  5. The mitral valve is degenerative. Mild to moderate mitral valve regurgitation. Moderate mitral annular calcification.  6. The aortic valve is tricuspid.  There is moderate calcification of the aortic valve. Aortic valve regurgitation is mild. Moderate to severe aortic valve stenosis. Vmax 3.4 m/s, MG , AVA 1.0 cm^2, DI 0.3  7. The inferior vena cava is normal in size with <50% respiratory variability, suggesting right atrial pressure of 8 mmHg. FINDINGS  Left Ventricle: Left ventricular ejection fraction, by estimation, is 50 to 55%. The left ventricle has low normal function. The left ventricle has no regional wall motion abnormalities. The left ventricular internal cavity size was normal in size. There is no left ventricular hypertrophy. Left ventricular diastolic parameters are indeterminate. Right Ventricle: The right ventricular size is normal. No increase in right ventricular wall thickness. Right ventricular systolic function is mildly reduced. Tricuspid regurgitation signal is inadequate for assessing PA pressure. Left Atrium: Left atrial size was severely dilated. Right Atrium: Right atrial size was severely dilated. Pericardium: There is no evidence of pericardial effusion. Mitral Valve: The mitral valve is degenerative in appearance. Moderate mitral annular calcification. Mild to moderate mitral valve regurgitation. Tricuspid Valve: The tricuspid valve is normal in structure. Tricuspid valve regurgitation is mild. Aortic Valve: The aortic valve is tricuspid. There is moderate calcification of the aortic valve. Aortic valve regurgitation is mild. Moderate aortic stenosis is present. Aortic valve mean gradient measures 20.0 mmHg. Aortic valve peak gradient measures 37.1 mmHg. Aortic valve area, by VTI measures 1.12 cm. Pulmonic Valve: The pulmonic valve was not well visualized. Pulmonic valve regurgitation is trivial. Aorta: The aortic root and ascending aorta are structurally normal, with no evidence of dilitation. Venous: The inferior vena cava is normal in size with less than 50% respiratory variability, suggesting right atrial pressure of 8 mmHg.  IAS/Shunts: The interatrial septum was not well visualized.  LEFT VENTRICLE PLAX 2D LVIDd:         5.10 cm LVIDs:         3.70 cm LV PW:         0.90 cm   3D Volume EF LV IVS:  0.80 cm   LV 3D EDV:   83.54 ml LVOT diam:     2.10 cm   LV 3D ESV:   30.30 ml LV SV:         75 LV SV Index:   40 LVOT Area:     3.46 cm  RIGHT VENTRICLE            IVC RV Basal diam:  3.50 cm    IVC diam: 1.80 cm RV S prime:     9.14 cm/s TAPSE (M-mode): 1.5 cm LEFT ATRIUM              Index        RIGHT ATRIUM           Index LA diam:        4.80 cm  2.59 cm/m   RA Area:     27.70 cm LA Vol (A2C):   68.7 ml  37.02 ml/m  RA Volume:   96.50 ml  52.00 ml/m LA Vol (A4C):   133.0 ml 71.67 ml/m LA Biplane Vol: 100.0 ml 53.88 ml/m  AORTIC VALVE AV Area (Vmax):    1.15 cm AV Area (Vmean):   1.16 cm AV Area (VTI):     1.12 cm AV Vmax:           304.67 cm/s AV Vmean:          204.000 cm/s AV VTI:            0.669 m AV Peak Grad:      37.1 mmHg AV Mean Grad:      20.0 mmHg LVOT Vmax:         101.15 cm/s LVOT Vmean:        68.100 cm/s LVOT VTI:          0.216 m LVOT/AV VTI ratio: 0.32  AORTA Ao Root diam: 3.70 cm Ao Asc diam:  3.50 cm  SHUNTS Systemic VTI:  0.22 m Systemic Diam: 2.10 cm Lonni Nanas MD Electronically signed by Lonni Nanas MD Signature Date/Time: 01/18/2024/6:41:30 PM    Final    CT ANGIO HEAD NECK W WO CM Result Date: 01/18/2024 CLINICAL DATA:  89 year old male with abnormal brain MRI 0048 hours today. Acute and chronic intracranial hemorrhage including microhemorrhages, subarachnoid blood. Hypertension and status post fall on Eliquis . Furthermore patient is currently being treated for dementia with NMDA antagonist. EXAM: CT ANGIOGRAPHY HEAD AND NECK WITH AND WITHOUT CONTRAST TECHNIQUE: Multidetector CT imaging of the head and neck was performed using the standard protocol during bolus administration of intravenous contrast. Multiplanar CT image reconstructions and MIPs were obtained to evaluate the  vascular anatomy. Carotid stenosis measurements (when applicable) are obtained utilizing NASCET criteria, using the distal internal carotid diameter as the denominator. RADIATION DOSE REDUCTION: This exam was performed according to the departmental dose-optimization program which includes automated exposure control, adjustment of the mA and/or kV according to patient size and/or use of iterative reconstruction technique. CONTRAST:  75mL OMNIPAQUE  IOHEXOL  350 MG/ML SOLN COMPARISON:  Brain MRI 0048 hours today, head CT yesterday. FINDINGS: CT HEAD Brain: Multiple subcentimeter small hyperdense intra-axial hemorrhages (series 4, image 13) appears stable from the head CT yesterday. Left posterior convexity subarachnoid hemorrhage or effusion by MRI is largely occult by CT. No new or progressed intracranial hemorrhage identified. No IVH or ventriculomegaly. No intracranial mass effect or midline shift. Stable gray-white matter differentiation throughout the brain., including right temporal lobe encephalomalacia. Calvarium and skull base: Stable.  No  skull fracture identified. Paranasal sinuses: Stable, well aerated. Orbits: No discrete orbit or scalp soft tissue injury identified. CTA NECK Skeleton: Advanced chronic cervical spine disc and endplate degeneration. No acute osseous abnormality identified. Upper chest: Negative. Other neck: Negative nonvascular neck soft tissue spaces. Aortic arch: Probable 3 vessel arch but most of the aortic arch is not included. Distal arch atherosclerosis. Right carotid system: Patent with soft and calcified plaque but no significant stenosis. Left carotid system: Patent with soft and calcified plaque. Moderate atherosclerosis at the lateral left ICA origin and bulb. But less than 50 % stenosis with respect to the distal vessel there. Vertebral arteries: Proximal subclavian arteries are patent without stenosis. Left subclavian soft and calcified plaque. Normal right vertebral artery  origin. Patent left vertebral artery with soft and calcified plaque resulting in mild stenosis. Dominant left vertebral artery with no additional plaque or stenosis to the skull base. Patent non dominant right vertebral to the skull base. CTA HEAD Posterior circulation: Both vertebral arteries are patent to the vertebrobasilar junction. Dominant left V4 with mild plaque. Patent PICA origins. Patent basilar artery without stenosis. Patent SCA and left PCA origins. Fetal type right PCA origin. Left posterior communicating artery diminutive or absent. Patent bilateral PCA branches. Mild P2 irregularity greater on the right. Anterior circulation: Both ICA siphons are patent. Bilateral siphon calcified plaque, mild on the left. Moderate right siphon cavernous segment calcified plaque. No siphon stenosis. Normal right posterior communicating artery origin. Patent, mildly ectatic carotid termini. Normal MCA and ACA origins. Normal anterior communicating artery and bilateral ACA branches. Left MCA M1 segment is tortuous. Left MCA bifurcation is patent, mildly ectatic. Right MCA M1 segment and trifurcation are patent and tortuous. Bilateral MCA branches are within normal limits. Venous sinuses: Patent. Anatomic variants: Dominant left vertebral artery. Fetal right PCA origin. Review of the MIP images confirms the above findings IMPRESSION: 1. Unchanged several acute subcentimeter parenchymal brain hemorrhages by CT. And left posterior convexity SAH or subarachnoid effusion remains occult by CT. No intracranial mass effect. No new intracranial abnormality. 2. CTA is negative for large vessel occlusion or intracranial aneurysm. Widespread atherosclerosis in the head and neck, but no hemodynamically significant arterial stenosis. Electronically Signed   By: VEAR Hurst M.D.   On: 01/18/2024 12:20   EEG adult Result Date: 01/18/2024 Matthews Elida HERO, MD     01/18/2024  8:54 AM Routine EEG Report DONALDO TEEGARDEN is a 89 y.o. male  with a history of altered mental status and subacute subarachnoid hemorrhage who is undergoing an EEG to evaluate for seizures. Report: This EEG was acquired with electrodes placed according to the International 10-20 electrode system (including Fp1, Fp2, F3, F4, C3, C4, P3, P4, O1, O2, T3, T4, T5, T6, A1, A2, Fz, Cz, Pz). The following electrodes were missing or displaced: none. The occipital dominant rhythm was 7 Hz. This activity is reactive to stimulation. Drowsiness was manifested by background fragmentation; deeper stages of sleep were not identified. There was no focal slowing. There were no interictal epileptiform discharges. There were no electrographic seizures identified. Photic stimulation and hyperventilation were not performed. Impression and clinical correlation: This EEG was obtained while awake and drowsy and is abnormal due to mild diffuse slowing indicative of global cerebral dysfunction. Epileptiform abnormalities were not seen during this recording. Elida Matthews, MD [facility] [phone] If 7pm- 7am, please page neurology on call as listed in AMION.   MR Brain W and Wo Contrast Result Date: 01/18/2024 CLINICAL DATA:  Initial evaluation for acute mental status change. EXAM: MRI HEAD WITHOUT AND WITH CONTRAST TECHNIQUE: Multiplanar, multiecho pulse sequences of the brain and surrounding structures were obtained without and with intravenous contrast. CONTRAST:  7mL GADAVIST  GADOBUTROL  1 MMOL/ML IV SOLN COMPARISON:  CT from 01/17/2024 FINDINGS: Brain: Generalized age-related cerebral atrophy. Patchy T2/FLAIR hyperintensity involving the periventricular and deep white matter, most characteristic of chronic microvascular ischemic disease, mild for age. Encephalomalacia and gliosis involving the right temporal lobe, consistent with a chronic right MCA distribution infarct. Chronic hemosiderin staining present at this location. Additional small remote left frontal cortical infarct  noted. No evidence for acute or subacute infarct. Punctate focus of diffusion signal abnormality at the left occipital lobe felt to be related to adjacent susceptibility artifact (series 5, image 76). Multiple scattered foci of susceptibility artifact are seen involving the bilateral cerebral hemispheres, consistent with micro hemorrhages. A few of these appear to correspond with hyperdensity seen on prior head CT, consistent with small acute bleeds. Most notable of these measures 7 mm at the left occipital lobe (series 12, image 27). These foci are predominantly peripheral in location, with most of these foci new as compared to prior brain MRI from 12/04/2022. Additionally, there is abnormal sulcal FLAIR signal intensity with hemosiderin staining within the left temporal occipital region, concerning for subarachnoid hemorrhage (series 11, images 32-21). No appreciable subarachnoid blood seen on prior head CT. No mass lesion, midline shift, or significant mass effect. Mild ex vacuo dilatation of the right lateral ventricle without hydrocephalus. No extra-axial fluid collection. Pituitary gland and suprasellar region within normal limits. Asymmetric leptomeningeal enhancement seen within the left temporal occipital region, likely reactive in nature due to the underlying subarachnoid findings at these locations (series 17, image 6). No other abnormal enhancement. No findings to suggest metastatic disease. Vascular: Major intracranial vascular flow voids are maintained. Skull and upper cervical spine: Craniocervical junction within normal limits. No acute scalp soft tissue abnormality. Sinuses/Orbits: Prior bilateral ocular lens replacement. Globes or soft tissues demonstrate no acute finding. Mild scattered mucosal thickening present about the ethmoidal air cells. Paranasal sinuses are otherwise clear. No significant mastoid effusion. Other: None. IMPRESSION: 1. Multiple scattered foci of susceptibility artifact  involving the bilateral cerebral hemispheres, consistent with micro hemorrhages. A few of these appear to correspond with hyperdensities seen on prior head CT, consistent with small acute bleeds. Additionally, there is abnormal sulcal FLAIR signal intensity with hemosiderin staining within the left temporoccipital region, concerning for underlying subarachnoid hemorrhage, suspected to be subacute in nature as no hyperdense subarachnoid blood seen on prior head CT. Findings are nonspecific, with primary differential considerations including sequelae of cerebral amyloid angiopathy, trauma, or coagulopathy. 2. Asymmetric leptomeningeal enhancement within the left temporoccipital region, likely reactive in nature due to the underlying subarachnoid findings at these locations. 3. No other acute intracranial abnormality. 4. Chronic right MCA distribution infarct, with additional small remote left frontal cortical infarct. Electronically Signed   By: Morene Hoard M.D.   On: 01/18/2024 01:48   CT HEAD WO CONTRAST Addendum Date: 01/17/2024 ADDENDUM REPORT: 01/17/2024 18:20 ADDENDUM: These results were called by telephone at the time of interpretation on 01/17/2024 at 6:20 pm to provider [facility] , who verbally acknowledged these results. Electronically Signed   By: Morgane  Naveau M.D.   On: 01/17/2024 18:20   Result Date: 01/17/2024 CLINICAL DATA:  Head trauma, moderate-severe; Polytrauma, blunt. History of prostate cancer. EXAM: CT HEAD WITHOUT CONTRAST CT CERVICAL SPINE WITHOUT CONTRAST TECHNIQUE: Multidetector CT imaging  of the head and cervical spine was performed following the standard protocol without intravenous contrast. Multiplanar CT image reconstructions of the cervical spine were also generated. RADIATION DOSE REDUCTION: This exam was performed according to the departmental dose-optimization program which includes automated exposure control, adjustment of the mA and/or kV according to patient size  and/or use of iterative reconstruction technique. COMPARISON:  CT head 11/14/2022 FINDINGS: CT HEAD FINDINGS Brain: Patchy and confluent areas of decreased attenuation are noted throughout the deep and periventricular white matter of the cerebral hemispheres bilaterally, compatible with chronic microvascular ischemic disease. 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 No evidence of large-territorial acute infarction. Several foci of hyperdensity along the left occipital and temporal lobes best evaluated on sagittal and coronal view (5:38, 4:56) with these measuring 4-5 mm each. No mass lesion. No extra-axial collection. No mass effect or midline shift. No hydrocephalus. Basilar cisterns are patent. Vascular: No hyperdense vessel. Atherosclerotic calcifications are present within the cavernous internal carotid arteries. Skull: No acute fracture or focal lesion. Sinuses/Orbits: Paranasal sinuses and mastoid air cells are clear. The orbits are unremarkable. Other: None. CT CERVICAL SPINE FINDINGS Alignment: Normal. Skull base and vertebrae: Multilevel moderate degenerative changes of the spine. Associated severe left C4-C5 osseous neural foramina. No associated severe osseous central canal stenosis. No acute fracture. No aggressive appearing focal osseous lesion or focal pathologic process. Soft tissues and spinal canal: No prevertebral fluid or swelling. No visible canal hematoma. Upper chest: Unremarkable. Other: Atherosclerotic plaque of the carotid arteries within the neck. IMPRESSION: 1. Total of four 4-5 mm hyperdense foci along the left frontal, temporal, occipital lobes. Findings could represent intraparenchymal foci of hemorrhage versus underlying metastases. Recommend follow-up CT head in 4-6 hours. Consider MRI head with and without contrast for evaluation of underlying masses if clinically indicated. 2. No acute displaced fracture or traumatic listhesis of the cervical spine. 3.  Severe left C4-C5 osseous neural foramina. Electronically Signed: By: Morgane  Naveau M.D. On: 01/17/2024 18:17   CT CERVICAL SPINE WO CONTRAST Addendum Date: 01/17/2024 ADDENDUM REPORT: 01/17/2024 18:20 ADDENDUM: These results were called by telephone at the time of interpretation on 01/17/2024 at 6:20 pm to provider [facility] , who verbally acknowledged these results. Electronically Signed   By: Morgane  Naveau M.D.   On: 01/17/2024 18:20   Result Date: 01/17/2024 CLINICAL DATA:  Head trauma, moderate-severe; Polytrauma, blunt. History of prostate cancer. EXAM: CT HEAD WITHOUT CONTRAST CT CERVICAL SPINE WITHOUT CONTRAST TECHNIQUE: Multidetector CT imaging of the head and cervical spine was performed following the standard protocol without intravenous contrast. Multiplanar CT image reconstructions of the cervical spine were also generated. RADIATION DOSE REDUCTION: This exam was performed according to the departmental dose-optimization program which includes automated exposure control, adjustment of the mA and/or kV according to patient size and/or use of iterative reconstruction technique. COMPARISON:  CT head 11/14/2022 FINDINGS: CT HEAD FINDINGS Brain: Patchy and confluent areas of decreased attenuation are noted throughout the deep and periventricular white matter of the cerebral hemispheres bilaterally, compatible with chronic microvascular ischemic disease. 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 No evidence of large-territorial acute infarction. Several foci of hyperdensity along the left occipital and temporal lobes best evaluated on sagittal and coronal view (5:38, 4:56) with these measuring 4-5 mm each. No mass lesion. No extra-axial collection. No mass effect or midline shift. No hydrocephalus. Basilar cisterns are patent. Vascular: No hyperdense vessel. Atherosclerotic calcifications are present within the cavernous internal carotid arteries. Skull: No acute fracture  or  focal lesion. Sinuses/Orbits: Paranasal sinuses and mastoid air cells are clear. The orbits are unremarkable. Other: None. CT CERVICAL SPINE FINDINGS Alignment: Normal. Skull base and vertebrae: Multilevel moderate degenerative changes of the spine. Associated severe left C4-C5 osseous neural foramina. No associated severe osseous central canal stenosis. No acute fracture. No aggressive appearing focal osseous lesion or focal pathologic process. Soft tissues and spinal canal: No prevertebral fluid or swelling. No visible canal hematoma. Upper chest: Unremarkable. Other: Atherosclerotic plaque of the carotid arteries within the neck. IMPRESSION: 1. Total of four 4-5 mm hyperdense foci along the left frontal, temporal, occipital lobes. Findings could represent intraparenchymal foci of hemorrhage versus underlying metastases. Recommend follow-up CT head in 4-6 hours. Consider MRI head with and without contrast for evaluation of underlying masses if clinically indicated. 2. No acute displaced fracture or traumatic listhesis of the cervical spine. 3. Severe left C4-C5 osseous neural foramina. Electronically Signed: By: Morgane  Naveau M.D. On: 01/17/2024 18:17   DG Pelvis Portable Result Date: 01/17/2024 CLINICAL DATA:  Trauma, fall. EXAM: PORTABLE PELVIS 1-2 VIEWS COMPARISON:  04/16/2019. FINDINGS: There is no evidence of acute fracture or dislocation. Mild degenerative changes are noted at the hips bilaterally. There are degenerative changes in lower lumbar spine. Radiation therapy seeds are noted in the region of the prostate gland. IMPRESSION: No acute fracture or dislocation. Electronically Signed   By: Leita Birmingham M.D.   On: 01/17/2024 17:32   DG Chest Port 1 View Result Date: 01/17/2024 CLINICAL DATA:  Trauma.  Fall on blood thinners EXAM: PORTABLE CHEST 1 VIEW COMPARISON:  None Available. FINDINGS: Normal mediastinum and cardiac silhouette. Normal pulmonary vasculature. No evidence of effusion, infiltrate,  or pneumothorax. No acute bony abnormality. IMPRESSION: No acute cardiopulmonary process. Electronically Signed   By: Jackquline Boxer M.D.   On: 01/17/2024 17:32     PHYSICAL EXAM  Temp:  [97.6 F (36.4 C)-98.6 F (37 C)] 98.6 F (37 C) (07/08 1531) Pulse Rate:  [60-79] 61 (07/08 1531) Resp:  [12-18] 17 (07/08 1531) BP: (127-155)/(59-81) 127/59 (07/08 1531) SpO2:  [95 %-99 %] 98 % (07/08 1531)  General - Well nourished, well developed, in no apparent distress.  Ophthalmologic - fundi not visualized due to noncooperation.  Cardiovascular - irregularly irregular heart rate and rhythm.  Neuro - awake, alert, eyes open, orientated to age and people and year but not to place, month or situation. No aphasia, seems to have fluent language but not coherent and jumping content, able to follow all simple commands. Able to name 2/4 and repeat simple sentences. No gaze palsy, tracking bilaterally, visual field full. No facial droop but bilateral dressing at lips due to skin cancer removal. Tongue midline. Bilateral UEs 4/5, no drift. Bilaterally LEs 3/5 proximal and 4/5 distally. Sensation symmetrical bilaterally, b/l FTN intact, gait not tested.     ASSESSMENT/PLAN Mr. Calvin Burnett is a 89 y.o. male with history of DM, HTN, afib on eliquis , dementia on namenda , squamous cell carcinoma, admitted for fall, confusion and syncope. No TNK given due to ICH.    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 Old right temporal infarct MRI with and without contrast Multiple scattered foci of susceptibility artifact involving the bilateral cerebral hemispheres, consistent with micro hemorrhages. A few of these appear to correspond with hyperdensities seen on prior head CT, consistent with small acute bleeds. Additionally, there is  abnormal sulcal FLAIR signal intensity with hemosiderin staining within the  left temporoccipital region, concerning for underlying subarachnoid hemorrhage, suspected to be subacute in nature. Findings are nonspecific, with primary differential considerations including sequelae of cerebral amyloid angiopathy, trauma, or coagulopathy. CTA head and neck unremarkable, Widespread atherosclerosis in the head and neck, but no hemodynamically significant arterial stenosis. 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  Alternatively, some studies showed DAPT is inferior to [facility] for 6 weeks.   Diabetes HgbA1c 6.2 goal < 7.0 Controlled CBG monitoring SSI DM education and close PCP follow up  Hypertension Home meds: norvasc  2.5, metoprolol  25, diovan  80 Stable on the high end Now on norvasc  5, avapro  75  BP goal < 140 given CAA Long term BP goal normotensive  Hyperlipidemia Home meds:  crestor  20  LDL 87, goal < 70 Now on crestor  20 Continue statin at discharge. Pt not SATURN candidate given dementia  Other Stroke Risk Factors Advanced age  Other Active Problems Dementia on namenda  - continue home meds Squamous cell carcinoma - b/l lip and facial dressing  Hospital day # 2  Neurology will sign off. Please call with questions. Pt will follow up with Dr.  Rosemarie at [facility] in 4 weeks. Thanks for the consult.   Ary Cummins, MD PhD Stroke Neurology 01/20/2024 4:19 PM       To contact Stroke Continuity provider, please refer to [email or web address]. After hours, contact General Neurology

## 2024-01-20 NOTE — TOC Progression Note (Signed)
 Transition of Care [facility]) - Progression Note    Patient Details  Name: ALFIO LOESCHER MRN: [id number] Date of Birth: 03/30/1934  [facility]) CM/SW Contact  Almarie CHRISTELLA Goodie, [place] Phone Number: 01/20/2024, 4:21 PM  Clinical Narrative:   CSW attempted to reach daughter, [name], left a voicemail asking for return call. CSW went by the room, but no family at bedside. CSW to follow to provide bed offers.    Expected Discharge Plan: Skilled Nursing Facility Barriers to Discharge: Continued Medical Work up  Expected Discharge Plan and Services In-house Referral: Clinical Social Work Discharge Planning Services: CM Consult Post Acute Care Choice: Skilled Nursing Facility Living arrangements for the past 2 months: Single Family Home                                       Social Determinants of Health (SDOH) Interventions SDOH Screenings   Food Insecurity: No Food Insecurity (01/19/2024)  Housing: Low Risk  (01/19/2024)  Transportation Needs: No Transportation Needs (01/19/2024)  Utilities: Not At Risk (01/19/2024)  Depression (PHQ2-9): Low Risk  (11/12/2023)  Social Connections: Socially Isolated (01/19/2024)  Tobacco Use: Medium Risk (01/17/2024)    Readmission Risk Interventions     No data to display

## 2024-01-21 DIAGNOSIS — I609 Nontraumatic subarachnoid hemorrhage, unspecified: Secondary | ICD-10-CM | POA: Diagnosis not present

## 2024-01-21 LAB — BASIC METABOLIC PANEL WITH GFR
Anion gap: 5 (ref 5–15)
BUN: 23 mg/dL (ref 8–23)
CO2: 27 mmol/L (ref 22–32)
Calcium: 8.4 mg/dL — ABNORMAL LOW (ref 8.9–10.3)
Chloride: 104 mmol/L (ref 98–111)
Creatinine, Ser: 1.61 mg/dL — ABNORMAL HIGH (ref 0.61–1.24)
GFR, Estimated: 41 mL/min — ABNORMAL LOW (ref >60)
Glucose, Bld: 97 mg/dL (ref 70–99)
Potassium: 3.9 mmol/L (ref 3.5–5.1)
Sodium: 136 mmol/L (ref 135–145)

## 2024-01-21 LAB — T3: T3, Total: 135 ng/dL (ref 71–180)

## 2024-01-21 MED ORDER — SODIUM CHLORIDE 0.9 % IV SOLN: INTRAVENOUS | Status: DC

## 2024-01-21 NOTE — Progress Notes (Addendum)
 PROGRESS NOTE    Calvin Burnett  FMW:[id number] DOB: 30-Jun-1934 DOA: 01/17/2024 PCP: Katrinka Garnette KIDD, MD   Brief Narrative: 89 year old past medical history significant for dementia, diabetes type 2, mild AAS, A-fib, hypertension, CKD stage III, history of colon cancer status post right hemicolectomy 2019 and concomitant sigmoidectomy for diverticular stricture who was brought to the hospital for confusion.  He also had a witnessed syncopal episode when ambulating to the car before coming to the hospital.  Baseline dementia as noted in disoriented but can carry on conversation easily at baseline per daughter.  CT head noted hyperdense foci of the left brain concerning for hemorrhage.  MRI of the brain was performed and consistent with multiple area of small acute bleeds involving the left frontal temporal occipital region.  Finding also concerning for underlying cerebral amyloid angiopathy.  Chronic right MCA infarct and a small left frontal infarct.   Assessment & Plan:   Principal Problem:   SAH (subarachnoid hemorrhage) ([name]) Active Problems:   Syncope   Atrial fibrillation with slow ventricular response ([name])   Acute metabolic encephalopathy   Essential hypertension   GERD (gastroesophageal reflux disease)   Vascular dementia ([name])   Type 2 diabetes mellitus ([name])   Chronic kidney disease, stage 3a ([name])   Aortic stenosis   Elevated troponin   Hypokalemia   History of CVA (cerebrovascular accident)   1-SAH Cerebral angiopathy  Chronic CVA Acute on chronic encephalopathy -MRI brain: shows underlying small bleeds involving the left frontal, temporal, occipital region. Noted to be subacute in nature and also concern for underlying cerebral amyloid angiopathy.Chronic right MCA infarct and small remote left frontal cortical infarct  -Dr. Lizabeth discussed  with neurology, patient would be a candidate for short-term anticoagulation or DAPT. Timing of this would need to be discussed  with cardiology upon timing of watchman consideration (if a candidate). This can be coordinated at outpatient follow-up  -EEG: negative.  -no antithrombotic fro now given ICH CAA  Syncope A-fib with slow VR Rate controlled.  ECHO moderate aortic stenosis/  Holding BB Appreciate cardiology evaluation.  Cardiology will arrange Watchman procedure.   Acute myocardial injury  -Suspect demand in setting of ?orthostatic BP drops, bradycardia.  -troponin flat.   Hypokalemia; Replaced.   Vascular dementia: -Holding Memantine  due to bradycardia; likely less of a culprit than bblocker.   History of CVA: -no on anticoagulation due to [facility] -on Crestor .   Aortic stenosis, moderate. : -Evaluated by cardiology, unlikely cause syncope.  Out patient follow up.    Hypertension: -Continue amlodipine . Hold ARB due to increase cr.   Diabetes type 2: Monitor CBG  CKD 3A: - Creatinine baseline 1.2--- 1.4 Cr increase to 1.6 Received contrast for Ct scans. Will start low rate IV fluids. Hold Arb.   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   Weight as of this encounter: 68.8 kg.   DVT prophylaxis: SCD Code Status: Full code Family Communication:family at bedside.  Disposition Plan:  Status is: Inpatient  Remains inpatient appropriate because: management of SAH, syncope    Consultants:  Cardiology Neurology   Procedures:  ECHO  Antimicrobials:    Subjective: He is alert, feels well. No new  complaints.   Objective: Vitals:   01/20/24 1953 01/20/24 2319 01/21/24 0400 01/21/24 0807  BP: 112/73 124/66 126/62 124/70  Pulse: 60 (!) 59 65 65  Resp: 18 18 18 18   Temp: 98.7 F (37.1 C) 97.6 F (36.4 C) 97.7 F (36.5 C) 98.1 F (36.7 C)  TempSrc: Oral Oral Oral Oral  SpO2: 95% 96% 96% 96%  Weight:      Height:        Intake/Output Summary (Last 24 hours) at 01/21/2024 0816 Last data filed at 01/21/2024 9357 Gross per 24 hour  Intake --  Output 1400 ml  Net -1400 ml   Filed Weights   01/17/24 1810 01/18/24 1212  Weight: 70.3 kg 68.8 kg    Examination:  General exam: Appears calm and comfortable  Respiratory system: Clear to auscultation. Respiratory effort normal. Cardiovascular system: S1 & S2 heard, RRR. No JVD, murmurs, rubs, gallops or clicks. No pedal edema. Gastrointestinal system: Abdomen is nondistended, soft and nontender. No organomegaly or masses felt. Normal bowel sounds heard. Central nervous system: Alert and oriented. Extremities: Symmetric 5 x 5 power.    Data Reviewed: I have personally reviewed following labs and imaging studies  CBC: Recent Labs  Lab 01/16/24 0547 01/17/24 1715 01/17/24 1735 01/18/24 0415 01/19/24 0431 01/20/24 0454  WBC 5.6 6.4  --  5.6 8.2 6.3  NEUTROABS 3.5  --   --   --  4.0 4.1  HGB 14.1 14.2 13.6 13.4 14.6 13.9  HCT 39.9 39.2 40.0 38.6* 42.0 39.9  MCV 90.7 91.4  --  91.7 91.9 90.5  PLT 145* 152  --  143* 171 156   Basic Metabolic Panel: Recent Labs  Lab 01/17/24 1715 01/17/24 1735 01/18/24 0415 01/19/24 0431 01/20/24 0454 01/21/24 0340  NA 142 144 140 137 138 136  K 3.5 3.5 3.2* 3.5 3.9 3.9  CL 105 104 105 103 104 104  CO2 28  --  26 22 24 27   GLUCOSE 109* 104* 180* 105* 108* 97  BUN 16 18 17 12 17 23   CREATININE 1.41* 1.40* 1.21 1.19 1.34* 1.61*  CALCIUM  8.8*  --  8.6* 9.1 8.8* 8.4*  MG  --   --  1.9 1.8 2.0  --   PHOS  --   --  3.5  --   --   --    GFR: Estimated Creatinine Clearance: 30.9  mL/min (A) (by C-G formula based on SCr of 1.61 mg/dL (H)). Liver Function Tests: Recent Labs  Lab 01/17/24 1715  AST 30  ALT 22  ALKPHOS 86  BILITOT 0.6  PROT 6.8  ALBUMIN  3.1*   No results for input(s): LIPASE, AMYLASE in the last 168 hours. No results for input(s): AMMONIA in the last 168 hours. Coagulation Profile: Recent Labs  Lab 01/17/24 1715  INR 1.4*   Cardiac Enzymes: No results for input(s): CKTOTAL, CKMB, CKMBINDEX, TROPONINI in the last 168 hours. BNP (last 3 results) No results for input(s): PROBNP in the last 8760 hours. HbA1C: No results for input(s): HGBA1C in the last 72 hours. CBG: No results for input(s): GLUCAP in the last 168 hours. Lipid Profile: No results for input(s): CHOL, HDL, LDLCALC, TRIG, CHOLHDL, LDLDIRECT in the last 72 hours. Thyroid  Function Tests: Recent Labs    01/19/24 0431 01/20/24 0454  TSH 6.968*  --   FREET4  --  1.22*   Anemia Panel: Recent Labs    01/19/24 0431  VITAMINB12 352   Sepsis Labs: Recent Labs  Lab 01/17/24 1735  LATICACIDVEN 1.4    No results found for this or any previous visit (from the past 240 hours).       Radiology Studies: No results found.      Scheduled Meds:  amLODipine   5 mg Oral Daily   escitalopram   5 mg Oral Daily   latanoprost   1 drop Both Eyes QHS   melatonin  6 mg Oral QHS   memantine   28 mg Oral Daily   rosuvastatin   20 mg Oral Once per day on Monday Thursday   sodium chloride  flush  3 mL Intravenous Q12H   Continuous Infusions:   LOS: 3 days    Time spent: 35 minutes    Bisma Klett A Rehmat Murtagh, MD [name]   If 7PM-7AM, please contact night-coverage [email or web address]  01/21/2024, 8:16 AM

## 2024-01-21 NOTE — TOC Progression Note (Signed)
 Transition of Care [facility]) - Progression Note    Patient Details  Name: Calvin Burnett MRN: [id number] Date of Birth: 1934-02-21  Transition [facility]) CM/SW Contact  Almarie CHRISTELLA Goodie, [place] Phone Number: 01/21/2024, 3:53 PM  Clinical Narrative:   CSW spoke with patient's daughter, [name], to provide bed offers. [name] spoke with her sister and they chose [facility]. CSW confirmed bed availability with [facility]. CSW contacted [employer] to initiate insurance authorization, and patient was approved. Auth ID [id number]. Per MD, hopeful for discharge tomorrow if medically stable. [facility] able to admit tomorrow. CSW to follow.    Expected Discharge Plan: Skilled Nursing Facility Barriers to Discharge: Continued Medical Work up  Expected Discharge Plan and Services In-house Referral: Clinical Social Work Discharge Planning Services: CM Consult Post Acute Care Choice: Skilled Nursing Facility Living arrangements for the past 2 months: Single Family Home                                       Social Determinants of Health (SDOH) Interventions SDOH Screenings   Food Insecurity: No Food Insecurity (01/19/2024)  Housing: Low Risk  (01/19/2024)  Transportation Needs: No Transportation Needs (01/19/2024)  Utilities: Not At Risk (01/19/2024)  Depression (PHQ2-9): Low Risk  (11/12/2023)  Social Connections: Socially Isolated (01/19/2024)  Tobacco Use: Medium Risk (01/17/2024)    Readmission Risk Interventions     No data to display

## 2024-01-21 NOTE — Progress Notes (Signed)
 Physical Therapy Treatment Patient Details Name: Calvin Burnett MRN: [id number] DOB: 07-20-1933 Today's Date: 01/21/2024   History of Present Illness Calvin Burnett is a 89 y.o. male who presented to the ED 01/17/24 after a fall. CT Head with 4  hyperdense foci in L frontal, temporal and occipital lobe. MRI Brain most consistent with cerebral amyloid angiopathy and noted subacute appearing SAH. EEG abnormal due to mild diffuse slowing indicative of global cerebral dysfunction. PMHx:  vascular dementia, CVA, A-fib on AC, mild aortic stenosis, HTN, T2DM, CKD 3, colon adenocarcinoma stage I s/p R hemicolectomy '19 with concominant sigmoidectomy for diverticular stricture, and GERD.    PT Comments  Pt received in supine and agreeable to session. Pt requires slightly increased cues for awareness and demonstrates good command following, but decreased carryover throughout session. Pt demonstrates fair balance with RW support, but has increased instability due to low foot clearance requiring increased cues. Pt requires min A for balance during static standing marches with focus on increased foot clearance. Pt continues to benefit from PT services to progress toward functional mobility goals.    If plan is discharge home, recommend the following: A little help with walking and/or transfers;A little help with bathing/dressing/bathroom;Assistance with cooking/housework;Direct supervision/assist for medications management;Direct supervision/assist for financial management;Help with stairs or ramp for entrance;Supervision due to cognitive status   Can travel by private vehicle     Yes  Equipment Recommendations  None recommended by PT    Recommendations for Other Services       Precautions / Restrictions Precautions Precautions: Fall Recall of Precautions/Restrictions: Impaired Precaution/Restrictions Comments: BP < 140 Restrictions Weight Bearing Restrictions Per Provider Order: No     Mobility   Bed Mobility Overal bed mobility: Needs Assistance Bed Mobility: Supine to Sit, Sit to Supine     Supine to sit: Contact guard, HOB elevated Sit to supine: Contact guard assist, HOB elevated   General bed mobility comments: increased time    Transfers Overall transfer level: Needs assistance Equipment used: Rolling walker (2 wheels), None Transfers: Sit to/from Stand Sit to Stand: Min assist, Contact guard assist           General transfer comment: cues for hand placement and min A from low EOB progressing to CGA with increased reps and cues for anterior weight shift    Ambulation/Gait Ambulation/Gait assistance: Contact guard assist Gait Distance (Feet): 200 Feet Assistive device: Rolling walker (2 wheels) Gait Pattern/deviations: Step-through pattern, Decreased stride length, Narrow base of support Gait velocity: decreased     General Gait Details: Short steps with low foot clearance, but pt able to briefly improve with frequent cues.   Stairs             Wheelchair Mobility     Tilt Bed    Modified Rankin (Stroke Patients Only)       Balance Overall balance assessment: Needs assistance Sitting-balance support: No upper extremity supported, Feet supported Sitting balance-Leahy Scale: Fair     Standing balance support: No upper extremity supported, During functional activity Standing balance-Leahy Scale: Fair Standing balance comment: with RW support                            Communication Communication Communication: No apparent difficulties  Cognition Arousal: Alert Behavior During Therapy: WFL for tasks assessed/performed   PT - Cognitive impairments: History of cognitive impairments, Orientation, Memory, Problem solving, Safety/Judgement, Awareness   Orientation impairments: Time  PT - Cognition Comments: Pt able to state that it is July, but says it is 2015 Following commands: Impaired Following  commands impaired: Follows multi-step commands inconsistently, Follows one step commands with increased time    Cueing Cueing Techniques: Verbal cues, Gestural cues  Exercises General Exercises - Lower Extremity Hip Flexion/Marching: AROM, Standing, Both, 10 reps Other Exercises Other Exercises: x5 serial STS    General Comments        Pertinent Vitals/Pain Pain Assessment Pain Assessment: No/denies pain     PT Goals (current goals can now be found in the care plan section) Acute Rehab PT Goals Patient Stated Goal: Return Home PT Goal Formulation: With patient/family Time For Goal Achievement: 02/01/24 Progress towards PT goals: Progressing toward goals    Frequency    Min 2X/week       AM-PAC PT 6 Clicks Mobility   Outcome Measure  Help needed turning from your back to your side while in a flat bed without using bedrails?: A Little Help needed moving from lying on your back to sitting on the side of a flat bed without using bedrails?: A Little Help needed moving to and from a bed to a chair (including a wheelchair)?: A Little Help needed standing up from a chair using your arms (e.g., wheelchair or bedside chair)?: A Little Help needed to walk in hospital room?: A Little Help needed climbing 3-5 steps with a railing? : A Lot 6 Click Score: 17    End of Session Equipment Utilized During Treatment: Gait belt Activity Tolerance: Patient tolerated treatment well Patient left: in bed;with call bell/phone within reach;with bed alarm set Nurse Communication: Mobility status PT Visit Diagnosis: Other abnormalities of gait and mobility (R26.89);Unsteadiness on feet (R26.81)     Time: 8569-8550 PT Time Calculation (min) (ACUTE ONLY): 19 min  Charges:    $Gait Training: 8-22 mins PT General Charges $$ ACUTE PT VISIT: 1 Visit                     Darryle George, PTA Acute Rehabilitation Services Secure Chat Preferred  [phone]    Darryle George 01/21/2024, 3:17 PM

## 2024-01-21 NOTE — Plan of Care (Signed)
 Pt doing well, more alert and with it today. Ready for DC tomorrow Problem: Health Behavior/Discharge Planning: Goal: Ability to manage health-related needs will improve Outcome: Progressing   Problem: Clinical Measurements: Goal: Ability to maintain clinical measurements within normal limits will improve Outcome: Progressing   Problem: Clinical Measurements: Goal: Will remain free from infection Outcome: Progressing

## 2024-01-22 ENCOUNTER — Ambulatory Visit: Admitting: Physician Assistant

## 2024-01-22 DIAGNOSIS — Z8673 Personal history of transient ischemic attack (TIA), and cerebral infarction without residual deficits: Secondary | ICD-10-CM | POA: Diagnosis not present

## 2024-01-22 DIAGNOSIS — E538 Deficiency of other specified B group vitamins: Secondary | ICD-10-CM | POA: Diagnosis not present

## 2024-01-22 DIAGNOSIS — R7989 Other specified abnormal findings of blood chemistry: Secondary | ICD-10-CM | POA: Diagnosis not present

## 2024-01-22 DIAGNOSIS — E119 Type 2 diabetes mellitus without complications: Secondary | ICD-10-CM | POA: Diagnosis not present

## 2024-01-22 DIAGNOSIS — K651 Peritoneal abscess: Secondary | ICD-10-CM | POA: Diagnosis not present

## 2024-01-22 DIAGNOSIS — I609 Nontraumatic subarachnoid hemorrhage, unspecified: Secondary | ICD-10-CM | POA: Diagnosis not present

## 2024-01-22 DIAGNOSIS — F325 Major depressive disorder, single episode, in full remission: Secondary | ICD-10-CM | POA: Diagnosis not present

## 2024-01-22 DIAGNOSIS — I10 Essential (primary) hypertension: Secondary | ICD-10-CM | POA: Diagnosis not present

## 2024-01-22 DIAGNOSIS — K219 Gastro-esophageal reflux disease without esophagitis: Secondary | ICD-10-CM | POA: Diagnosis not present

## 2024-01-22 DIAGNOSIS — S0633AA Contusion and laceration of cerebrum, unspecified, with loss of consciousness status unknown, initial encounter: Secondary | ICD-10-CM | POA: Diagnosis not present

## 2024-01-22 DIAGNOSIS — I482 Chronic atrial fibrillation, unspecified: Secondary | ICD-10-CM | POA: Diagnosis not present

## 2024-01-22 DIAGNOSIS — N1831 Chronic kidney disease, stage 3a: Secondary | ICD-10-CM | POA: Diagnosis not present

## 2024-01-22 DIAGNOSIS — E1122 Type 2 diabetes mellitus with diabetic chronic kidney disease: Secondary | ICD-10-CM | POA: Diagnosis not present

## 2024-01-22 DIAGNOSIS — R41841 Cognitive communication deficit: Secondary | ICD-10-CM | POA: Diagnosis not present

## 2024-01-22 DIAGNOSIS — E876 Hypokalemia: Secondary | ICD-10-CM | POA: Diagnosis not present

## 2024-01-22 DIAGNOSIS — F01518 Vascular dementia, unspecified severity, with other behavioral disturbance: Secondary | ICD-10-CM | POA: Diagnosis not present

## 2024-01-22 DIAGNOSIS — E785 Hyperlipidemia, unspecified: Secondary | ICD-10-CM | POA: Diagnosis not present

## 2024-01-22 DIAGNOSIS — F0150 Vascular dementia without behavioral disturbance: Secondary | ICD-10-CM | POA: Diagnosis not present

## 2024-01-22 DIAGNOSIS — W19XXXD Unspecified fall, subsequent encounter: Secondary | ICD-10-CM | POA: Diagnosis not present

## 2024-01-22 DIAGNOSIS — H4052X3 Glaucoma secondary to other eye disorders, left eye, severe stage: Secondary | ICD-10-CM | POA: Diagnosis not present

## 2024-01-22 DIAGNOSIS — M109 Gout, unspecified: Secondary | ICD-10-CM | POA: Diagnosis not present

## 2024-01-22 DIAGNOSIS — I6930 Unspecified sequelae of cerebral infarction: Secondary | ICD-10-CM | POA: Diagnosis not present

## 2024-01-22 DIAGNOSIS — G9340 Encephalopathy, unspecified: Secondary | ICD-10-CM | POA: Diagnosis not present

## 2024-01-22 DIAGNOSIS — I618 Other nontraumatic intracerebral hemorrhage: Secondary | ICD-10-CM | POA: Diagnosis not present

## 2024-01-22 DIAGNOSIS — R55 Syncope and collapse: Secondary | ICD-10-CM | POA: Diagnosis not present

## 2024-01-22 DIAGNOSIS — R001 Bradycardia, unspecified: Secondary | ICD-10-CM | POA: Diagnosis not present

## 2024-01-22 LAB — BASIC METABOLIC PANEL WITH GFR
Anion gap: 6 (ref 5–15)
BUN: 19 mg/dL (ref 8–23)
CO2: 27 mmol/L (ref 22–32)
Calcium: 8.8 mg/dL — ABNORMAL LOW (ref 8.9–10.3)
Chloride: 105 mmol/L (ref 98–111)
Creatinine, Ser: 1.25 mg/dL — ABNORMAL HIGH (ref 0.61–1.24)
GFR, Estimated: 55 mL/min — ABNORMAL LOW (ref >60)
Glucose, Bld: 99 mg/dL (ref 70–99)
Potassium: 3.7 mmol/L (ref 3.5–5.1)
Sodium: 138 mmol/L (ref 135–145)

## 2024-01-22 MED ORDER — ESCITALOPRAM OXALATE 5 MG PO TABS: 5.0000 mg | ORAL_TABLET | Freq: Every day | ORAL | 3 refills | Status: AC

## 2024-01-22 MED ORDER — MELATONIN 3 MG PO TABS: 6.0000 mg | ORAL_TABLET | Freq: Every evening | ORAL | 0 refills | Status: DC | PRN

## 2024-01-22 MED ORDER — AMLODIPINE BESYLATE 10 MG PO TABS: 10.0000 mg | ORAL_TABLET | Freq: Every day | ORAL | 2 refills | Status: DC

## 2024-01-22 MED ORDER — VITAMIN B-12 1000 MCG PO TABS
500.0000 ug | ORAL_TABLET | Freq: Every day | ORAL | Status: DC
  Administered 2024-01-22: 500 ug via ORAL
  Filled 2024-01-22: qty 1

## 2024-01-22 MED ORDER — AMLODIPINE BESYLATE 5 MG PO TABS
10.0000 mg | ORAL_TABLET | Freq: Every day | ORAL | Status: DC
  Administered 2024-01-22: 10 mg via ORAL
  Filled 2024-01-22: qty 2

## 2024-01-22 NOTE — TOC Transition Note (Signed)
 Transition of Care [facility]) - Discharge Note   Patient Details  Name: Calvin Burnett MRN: [id number] Date of Birth: 1933/11/22  [facility]) CM/SW Contact:  Almarie CHRISTELLA Goodie, LCSW Phone Number: 01/22/2024, 12:27 PM   Clinical Narrative:   CSW notified by MD that patient is medically stable for discharge. CSW sent discharge information to [facility], confirmed receipt. CSW met with daughter, [name], at bedside to discuss, she is in agreement and will provide transportation. Bed will be available after 1 pm.  Number for report is [phone], Room 410B.    Final next level of care: Skilled Nursing Facility Barriers to Discharge: Barriers Resolved   Patient Goals and CMS Choice   CMS Medicare.gov Compare Post Acute Care list provided to:: Patient Represenative (must comment) Choice offered to / list presented to : Adult Children      Discharge Placement              Patient chooses bed at: [facility] Patient to be transferred to facility by: Daughter Name of family member notified: [name] Patient and family notified of of transfer: 01/22/24  Discharge Plan and Services Additional resources added to the After Visit Summary for   In-house Referral: Clinical Social Work Discharge Planning Services: CM Consult Post Acute Care Choice: Skilled Nursing Facility                               Social Drivers of Health (SDOH) Interventions SDOH Screenings   Food Insecurity: No Food Insecurity (01/19/2024)  Housing: Low Risk  (01/19/2024)  Transportation Needs: No Transportation Needs (01/19/2024)  Utilities: Not At Risk (01/19/2024)  Depression (PHQ2-9): Low Risk  (11/12/2023)  Social Connections: Socially Isolated (01/19/2024)  Tobacco Use: Medium Risk (01/17/2024)     Readmission Risk Interventions     No data to display

## 2024-01-22 NOTE — Progress Notes (Signed)
 Occupational Therapy Treatment Patient Details Name: Calvin Burnett MRN: [id number] DOB: 05/16/1934 Today's Date: 01/22/2024   History of present illness Calvin Burnett is a 89 y.o. male who presented to the ED 01/17/24 after a fall. CT Head with 4  hyperdense foci in L frontal, temporal and occipital lobe. MRI Brain most consistent with cerebral amyloid angiopathy and noted subacute appearing SAH. EEG abnormal due to mild diffuse slowing indicative of global cerebral dysfunction. PMHx:  vascular dementia, CVA, A-fib on AC, mild aortic stenosis, HTN, T2DM, CKD 3, colon adenocarcinoma stage I s/p R hemicolectomy '19 with concominant sigmoidectomy for diverticular stricture, and GERD.   OT comments  Pt making good progress with functional goals, is pleasant and cooperative. Pt with good family support and eager to d/c to SNF to begin post acute rehab.      If plan is discharge home, recommend the following:  A little help with walking and/or transfers;A lot of help with bathing/dressing/bathroom;Direct supervision/assist for medications management;Direct supervision/assist for financial management;Assistance with cooking/housework;Assist for transportation;Help with stairs or ramp for entrance;Supervision due to cognitive status   Equipment Recommendations       Recommendations for Other Services      Precautions / Restrictions Precautions Precautions: Fall Restrictions Weight Bearing Restrictions Per Provider Order: No       Mobility Bed Mobility Overal bed mobility: Needs Assistance Bed Mobility: Supine to Sit, Sit to Supine     Supine to sit: Contact guard, HOB elevated Sit to supine: Contact guard assist, HOB elevated   General bed mobility comments: increased time    Transfers Overall transfer level: Needs assistance Equipment used: Rolling walker (2 wheels), None Transfers: Sit to/from Stand Sit to Stand: Min assist, Contact guard assist           General transfer  comment: cues for hand placement     Balance Overall balance assessment: Needs assistance Sitting-balance support: No upper extremity supported, Feet supported Sitting balance-Leahy Scale: Normal     Standing balance support: No upper extremity supported, During functional activity Standing balance-Leahy Scale: Fair                             ADL either performed or assessed with clinical judgement   ADL Overall ADL's : Needs assistance/impaired     Grooming: Wash/dry hands;Wash/dry face;Contact guard assist;Standing       Lower Body Bathing: Moderate assistance;Sit to/from stand       Lower Body Dressing: Sit to/from stand;Moderate assistance   Toilet Transfer: Minimal assistance;Rolling walker (2 wheels)   Toileting- Clothing Manipulation and Hygiene: Minimal assistance;Sit to/from stand       Functional mobility during ADLs: Minimal assistance;Rolling walker (2 wheels);Cueing for sequencing;Cueing for safety      Extremity/Trunk Assessment Upper Extremity Assessment Upper Extremity Assessment: Generalized weakness   Lower Extremity Assessment Lower Extremity Assessment: Defer to PT evaluation        Vision Baseline Vision/History: 1 Wears glasses Ability to See in Adequate Light: 1 Impaired     Perception     Praxis     Communication Communication Communication: No apparent difficulties   Cognition Arousal: Alert Behavior During Therapy: WFL for tasks assessed/performed               OT - Cognition Comments: hx of dementia                 Following commands: Impaired Following commands impaired: Follows  multi-step commands inconsistently, Follows one step commands with increased time      Cueing   Cueing Techniques: Verbal cues, Gestural cues  Exercises      Shoulder Instructions       General Comments      Pertinent Vitals/ Pain       Pain Assessment Pain Assessment: No/denies pain Pain Score: 0-No  pain Pain Intervention(s): Monitored during session, Repositioned  Home Living                                          Prior Functioning/Environment              Frequency  Min 2X/week        Progress Toward Goals  OT Goals(current goals can now be found in the care plan section)  Progress towards OT goals: Progressing toward goals     Plan      Co-evaluation                 AM-PAC OT 6 Clicks Daily Activity     Outcome Measure   Help from another person eating meals?: None Help from another person taking care of personal grooming?: A Little Help from another person toileting, which includes using toliet, bedpan, or urinal?: A Little Help from another person bathing (including washing, rinsing, drying)?: A Little Help from another person to put on and taking off regular upper body clothing?: A Little Help from another person to put on and taking off regular lower body clothing?: A Lot 6 Click Score: 18    End of Session Equipment Utilized During Treatment: Rolling walker (2 wheels);Gait belt  OT Visit Diagnosis: Other abnormalities of gait and mobility (R26.89);History of falling (Z91.81);Other symptoms and signs involving cognitive function   Activity Tolerance Patient tolerated treatment well   Patient Left with call bell/phone within reach;with family/visitor present;in bed;with bed alarm set   Nurse Communication          Time: 8994-8976 OT Time Calculation (min): 18 min  Charges: OT General Charges $OT Visit: 1 Visit OT Treatments $Self Care/Home Management : 8-22 mins    [name] Loose 01/22/2024, 1:46 PM

## 2024-01-22 NOTE — Discharge Summary (Addendum)
 Physician Discharge Summary   Patient: Calvin Burnett MRN: [id number] DOB: [birth year]  Admit date:     01/17/2024  Discharge date: 03/04/24  Discharge Physician: Owen DELENA Lore   PCP: Katrinka Garnette KIDD, MD   Recommendations at discharge:    Please monitor renal function, consider resumption of cozaar if renal function remain stable.  Needs further adjustment BP medications.  Follow up with cardiology for evaluation for Watchman procedure.  Needs to follow up with Dermatologist for further evaluation of melanoma.   Discharge Diagnoses: Active Problems:   Syncope   Atrial fibrillation with slow ventricular response ([name])   Acute metabolic encephalopathy   Essential hypertension   GERD (gastroesophageal reflux disease)   Vascular dementia ([name])   Type 2 diabetes mellitus ([name])   Chronic kidney disease, stage 3a ([name])   Aortic stenosis   Elevated troponin   Hypokalemia   History of CVA (cerebrovascular accident)   ICH (intracerebral hemorrhage) ([name])  Resolved Problems:   * No resolved hospital problems. [facility] Course: 89 year old past medical history significant for dementia, diabetes type 2, mild AAS, A-fib, hypertension, CKD stage III, history of colon cancer status post right hemicolectomy 2019 and concomitant sigmoidectomy for diverticular stricture who was brought to the hospital for confusion.  He also had a witnessed syncopal episode when ambulating to the car before coming to the hospital.   Baseline dementia as noted in disoriented but can carry on conversation easily at baseline per daughter.  CT head noted hyperdense foci of the left brain concerning for hemorrhage.  MRI of the brain was performed and consistent with multiple area of small acute bleeds involving the left frontal temporal occipital region.  Finding also concerning for underlying cerebral amyloid angiopathy.  Chronic right MCA infarct and a small left frontal infarct.  Patient was admitted after  syncope, found to have ICH, eliquis  has been discontinue. He develops bradycardia, BB have been discontinue as well. He will need to follow up with cardiology for watchman procedure.     Assessment and Plan:  1-ICH Cerebral angiopathy  Chronic CVA Acute on chronic encephalopathy -MRI brain: shows underlying small bleeds involving the left frontal, temporal, occipital region. Noted to be subacute in nature and also concern for underlying cerebral amyloid angiopathy.Chronic right MCA infarct and small remote left frontal cortical infarct  -Dr. Lizabeth discussed  with neurology, patient would be a candidate for short-term anticoagulation or DAPT. Timing of this would need to be discussed with cardiology upon timing of watchman consideration (if a candidate). This can be coordinated at outpatient follow-up  -EEG: negative.  -no antithrombotic  now given ICH, CAA Stable, alert, conversant , answer questions.   Syncope A-fib with slow VR Rate controlled.  ECHO moderate aortic stenosis/  Holding BB Appreciate cardiology evaluation.  Cardiology will arrange Watchman procedure.  No further syncope episodes.   Acute myocardial injury  -Suspect demand in setting of ?orthostatic BP drops, bradycardia.  -troponin flat.    Hypokalemia; Replaced.    Vascular dementia: -Holding Memantine  due to bradycardia; likely less of a culprit than bblocker.   History of CVA: -no on anticoagulation due to [facility] -on Crestor .    Aortic stenosis, moderate. : -Evaluated by cardiology, unlikely cause syncope.  Out patient follow up.      Hypertension: -Continue amlodipine . Hold ARB due to increase cr. Increase Norvasc  at discharge to 10 mg daily.    Diabetes type 2: Monitor CBG Resume metformin .   CKD 3A: - Creatinine baseline  1.2--- 1.4 -Cr increase to 1.6----after fluids down to 1.2  Holding Arb.    Colon adenocarcinoma stage I s/p R hemicolectomy '19 with concominant sigmoidectomy for  diverticular stricture - noted outpatient surveillance   Mood d/o - Continue home escitalopram    Glaucoma - Continue home GTTs   Melanoma - Patient had recent facial surgery this week removed melanoma from roof of mouth; the sutures he has in place on his right and left mouth are from the surgery not trauma from fall.  Estimated body mass index is 21.76 kg/m as calculated from the following:   Height as of this encounter: 5' 10 (1.778 m).   Weight as of this encounter: 68.8 kg.          Consultants: Neurology, Cardiology Procedures performed: none Disposition: Skilled nursing facility Diet recommendation:  Discharge Diet Orders (From admission, onward)     Start     Ordered   01/22/24 0000  Diet - low sodium heart healthy        01/22/24 0925           Carb modified diet DISCHARGE MEDICATION: Allergies as of 01/22/2024       Reactions   Kcentra  [prothrombin  Complex Conc Human] Anaphylaxis, Shortness Of Breath, Other (See Comments)   Patient immediately became short of breath and bright red. Started improving minutes after infusion stopped. Had seizure-like activity, also        Medication List     STOP taking these medications    Eliquis  5 MG Tabs tablet Generic drug: apixaban    memantine  28 MG Cp24 24 hr capsule Commonly known as: NAMENDA  XR   metoprolol  succinate 25 MG 24 hr tablet Commonly known as: TOPROL -XL   valsartan  80 MG tablet Commonly known as: DIOVAN        TAKE these medications    amLODipine  10 MG tablet Commonly known as: NORVASC  Take 1 tablet (10 mg total) by mouth daily. What changed:  medication strength how much to take   escitalopram  5 MG tablet Commonly known as: Lexapro  Take 1 tablet (5 mg total) by mouth daily.   latanoprost  0.005 % ophthalmic solution Commonly known as: XALATAN  Place 1 drop into both eyes at bedtime.   melatonin 3 MG Tabs tablet Take 2 tablets (6 mg total) by mouth at bedtime as needed.    rosuvastatin  20 MG tablet Commonly known as: CRESTOR  Take 1 tablet (20 mg total) by mouth 2 (two) times a week.        Contact information for follow-up providers     Rosemarie Eather RAMAN, MD. Schedule an appointment as soon as possible for a visit in 1 month(s).   Specialties: Neurology, Radiology Why: stroke clinic Contact information: [address] [place] [address] [phone]              Contact information for after-discharge care     Destination     [facility] .   Service: Skilled Nursing Contact information: [address] [place]  [address] [phone]                    Discharge Exam: Fredricka Weights   01/17/24 1810 01/18/24 1212  Weight: 70.3 kg 68.8 kg   General: NAD Cvs; S1 , S2 RRR  Condition at discharge: stable  The results of significant diagnostics from this hospitalization (including imaging, microbiology, ancillary and laboratory) are listed below for reference.   Imaging Studies: CT CARDIAC MORPH/PULM VEIN W/CM&W/O CA SCORE  Result Date: 03/01/2024 CLINICAL DATA:  Atrial fibrillation scheduled for Watchman procedure EXAM: Cardiac CT/CTA TECHNIQUE: A non-contrast, gated CT scan was obtained with axial slices of 2.5 mm through the heart for calcium  scoring. Calcium  scoring was performed using the Agatston method. A 120 kV prospective, gated, contrast cardiac scan was obtained. Gantry rotation speed was 230 msec and collimation was 0.63 mm. Nitroglycerin was not given. A delayed scan was obtained to exclude left atrial appendage thrombus. The 3D dataset was reconstructed in systole with motion correction. The 3D dataset was reconstructed at 5% intervals of the 25%-50% of the R-R cycle. Images were analyzed on a dedicated workstation using MPR, MIP, and VRT modes. The patient received 95 cc of contrast. FINDINGS: Left atrium: Mild enlargement. Filling defect in left atrial appendage on initial images that  resolves on delayed imaging, suggesting no thrombus in the left atrial appendage. Landing zone measures 25mm x 19mm in diameter with depth 16mm, suitable for 31mm FLX device Left ventricle: Normal in size. Right atrium: Severe enlargement. PFO Right ventricle:  Normal in size. Pericardium: Normal thickness Pulmonary veins: Normal configuration Pulmonary arteries: Dilated main pulmonary artery measuring 31mm Aorta: Dilated ascending aorta measuring 41mm Coronary Arteries: Normal coronary origin. Right dominance. The study was performed without use of NTG and insufficient for plaque evaluation. Coronary calcium  score 266 (percentile not available for age over 52) Valves: Moderate aortic valve calcifications (AV calcium  score 1739) IMPRESSION: 1. Left atrial appendage landing zone measures 25mm x 19mm in diameter with depth 16mm, suitable for 31mm FLX device 2. Filling defect in left atrial appendage on initial images that resolves on delayed imaging, suggesting no thrombus in the left atrial appendage 3.  PFO 4.  Dilated main pulmonary artery measuring 31mm 5.  Dilated ascending aorta measuring 41mm 6.  Moderate aortic valve calcifications (AV calcium  score 1739) 7. Coronary calcium  score 266 (percentile not available for age over 36) Electronically Signed   By: Lonni Nanas M.D.   On: 03/01/2024 17:34   ECHOCARDIOGRAM LIMITED Result Date: 02/23/2024    ECHOCARDIOGRAM LIMITED REPORT   Patient Name:   CORNELIO PARKERSON Date of Exam: 02/23/2024 Medical Rec #:  [id number]       Height:       70.0 in Accession #:    [id number]      Weight:       158.3 lb Date of Birth:  11/27/1933       BSA:          1.890 m Patient Age:    89 years        BP:           122/68 mmHg Patient Gender: M               HR:           56 bpm. Exam Location:  Inpatient Procedure: Limited Echo, Cardiac Doppler and Color Doppler (Both Spectral and            Color Flow Doppler were utilized during procedure). Indications:    Aortic stenosis  I35.0  History:        Patient has prior history of Echocardiogram examinations, most                 recent 01/18/2024. Aortic Valve Disease; Risk Factors:Hypertension                 and Diabetes.  Sonographer:    Jayson Gaskins Referring Phys: [id number] SHENG L  HALEY IMPRESSIONS  1. LIMITED ECHOCARDIOGRAM.  2. Left ventricular ejection fraction, by estimation, is 60 to 65%. The left ventricle has normal function.  3. Left atrial size was severely dilated.  4. The mitral valve is degenerative. Mild to moderate mitral valve regurgitation. No evidence of mitral stenosis.  5. Tricuspid valve regurgitation is mild to moderate.  6. Native aortic valve, severely calcified, severely reduced leaflet excursion, trivial aortic regurgitation, moderate to severe aortic stenosis (peak velocity 3.98 m/s, mean gradient 37 mmHg, dimensional index 0.30, AVA per VTI 0.86 cm).  7. The inferior vena cava is normal in size with greater than 50% respiratory variability, suggesting right atrial pressure of 3 mmHg. Comparison(s): A prior study was performed on 01/18/2024. Reported moderate to severe AS (peak velocity 3.34m/s, MG , AVA 1.0cm2, DI 0.3). FINDINGS  Left Ventricle: Left ventricular ejection fraction, by estimation, is 60 to 65%. The left ventricle has normal function. The left ventricular internal cavity size was normal in size. There is no left ventricular hypertrophy. Left Atrium: Left atrial size was severely dilated. Mitral Valve: The mitral valve is degenerative in appearance. There is mild thickening of the mitral valve leaflet(s). Normal mobility of the mitral valve leaflets. Mild mitral annular calcification. Mild to moderate mitral valve regurgitation. No evidence of mitral valve stenosis. Tricuspid Valve: The tricuspid valve is grossly normal. Tricuspid valve regurgitation is mild to moderate. Aortic Valve: Native aortic valve, severely calcified, severely reduced leaflet excursion, trivial aortic regurgitation,  moderate to severe aortic stenosis (peak velocity 3.98 m/s, mean gradient 37 mmHg, dimensional index 0.30, AVA per VTI 0.86 cm). There is severe aortic valve annular calcification. Aortic valve mean gradient measures 35.5 mmHg. Aortic valve peak gradient measures 60.5 mmHg. Aortic valve area, by VTI measures 0.89 cm. Pulmonic Valve: The pulmonic valve was not well visualized. Aorta: The aortic root is normal in size and structure. Venous: The inferior vena cava is normal in size with greater than 50% respiratory variability, suggesting right atrial pressure of 3 mmHg. Additional Comments: LIMITED ECHOCARDIOGRAM.  LEFT VENTRICLE PLAX 2D LVIDd:         5.06 cm LVIDs:         3.17 cm LV PW:         0.84 cm LV IVS:        0.85 cm LVOT diam:     1.96 cm LV SV:         82 LV SV Index:   43 LVOT Area:     3.02 cm  LEFT ATRIUM              Index LA Vol (A2C):   98.0 ml  51.86 ml/m LA Vol (A4C):   99.3 ml  52.54 ml/m LA Biplane Vol: 102.0 ml 53.97 ml/m  AORTIC VALVE AV Area (Vmax):    0.85 cm AV Area (Vmean):   0.95 cm AV Area (VTI):     0.89 cm AV Vmax:           389.00 cm/s AV Vmean:          285.500 cm/s AV VTI:            0.915 m AV Peak Grad:      60.5 mmHg AV Mean Grad:      35.5 mmHg LVOT Vmax:         109.00 cm/s LVOT Vmean:        89.900 cm/s LVOT VTI:          0.271  m LVOT/AV VTI ratio: 0.30  AORTA Ao Root diam: 3.21 cm  SHUNTS Systemic VTI:  0.27 m Systemic Diam: 1.96 cm Sunit Tolia Electronically signed by Madonna Large Signature Date/Time: 02/23/2024/5:29:54 PM    Final    CT Head Wo Contrast Result Date: 02/21/2024 CLINICAL DATA:  Headache, increasing frequency or severity EXAM: CT HEAD WITHOUT CONTRAST TECHNIQUE: Contiguous axial images were obtained from the base of the skull through the vertex without intravenous contrast. RADIATION DOSE REDUCTION: This exam was performed according to the departmental dose-optimization program which includes automated exposure control, adjustment of the mA and/or kV  according to patient size and/or use of iterative reconstruction technique. COMPARISON:  Brain MRI and head CT a 01/18/2024 FINDINGS: Brain: Subcentimeter hyperdense foci in the left occipital lobe persists but slightly diminished from prior CT, series 2, images 22 and 25. No evidence of new hemorrhage. Stable degree of atrophy and chronic small vessel ischemia. Chronic right MCA infarct, unchanged in appearance. There is no subdural collection. No midline shift. Vascular: Atherosclerosis of skullbase vasculature without hyperdense vessel or abnormal calcification. Skull: No fracture or focal lesion. Sinuses/Orbits: No acute findings. Again seen partial opacification of right ethmoid air cells. No mastoid effusion. Bilateral cataract resection. Other: None. IMPRESSION: 1. Subcentimeter hyperdense foci in the left occipital lobe persist but slightly diminished from prior CT, likely resolving hemorrhage. No evidence of new hemorrhage or other acute findings. 2. Stable atrophy, chronic small vessel ischemia, and chronic right MCA infarct. Electronically Signed   By: Andrea Gasman M.D.   On: 02/21/2024 16:14   US  Venous Img Lower Bilateral Result Date: 02/21/2024 CLINICAL DATA:  Bilateral lower leg swelling for 3 days. EXAM: BILATERAL LOWER EXTREMITY VENOUS DOPPLER ULTRASOUND TECHNIQUE: Gray-scale sonography with graded compression, as well as color Doppler and duplex ultrasound were performed to evaluate the lower extremity deep venous systems from the level of the common femoral vein and including the common femoral, femoral, profunda femoral, popliteal and calf veins including the posterior tibial, peroneal and gastrocnemius veins when visible. The superficial great saphenous vein was also interrogated. Spectral Doppler was utilized to evaluate flow at rest and with distal augmentation maneuvers in the common femoral, femoral and popliteal veins. COMPARISON:  None Available. FINDINGS: RIGHT LOWER EXTREMITY  Common Femoral Vein: No evidence of thrombus. Normal compressibility, respiratory phasicity and response to augmentation. Saphenofemoral Junction: No evidence of thrombus. Normal compressibility and flow on color Doppler imaging. Profunda Femoral Vein: No evidence of thrombus. Normal compressibility and flow on color Doppler imaging. Femoral Vein: No evidence of thrombus. Normal compressibility, respiratory phasicity and response to augmentation. Popliteal Vein: No evidence of thrombus. Normal compressibility, respiratory phasicity and response to augmentation. Calf Veins: No evidence of thrombus. Normal compressibility and flow on color Doppler imaging. Superficial Great Saphenous Vein: No evidence of thrombus. Normal compressibility. Venous Reflux:  None. Other Findings:  Soft tissue edema. LEFT LOWER EXTREMITY Common Femoral Vein: No evidence of thrombus. Normal compressibility, respiratory phasicity and response to augmentation. Saphenofemoral Junction: No evidence of thrombus. Normal compressibility and flow on color Doppler imaging. Profunda Femoral Vein: No evidence of thrombus. Normal compressibility and flow on color Doppler imaging. Femoral Vein: No evidence of thrombus. Normal compressibility, respiratory phasicity and response to augmentation. Popliteal Vein: No evidence of thrombus. Normal compressibility, respiratory phasicity and response to augmentation. Calf Veins: No evidence of thrombus. Normal compressibility and flow on color Doppler imaging. Superficial Great Saphenous Vein: No evidence of thrombus. Normal compressibility. Venous Reflux:  None. Other Findings:  Soft tissue edema. IMPRESSION: No evidence of deep venous thrombosis in either lower extremity. Electronically Signed   By: Newell Eke M.D.   On: 02/21/2024 16:03   DG Chest 2 View Result Date: 02/21/2024 CLINICAL DATA:  Edema. EXAM: CHEST - 2 VIEW COMPARISON:  01/17/2024 and CT chest 11/29/2017. FINDINGS: Trachea is midline.  Heart is at the upper limits of normal in size. Thoracic aorta is calcified. Small right pleural effusion with right basilar atelectasis. Trace left pleural effusion. Flowing anterior osteophytosis in the thoracic spine. IMPRESSION: 1. Small right pleural effusion with right basilar atelectasis. 2. Trace left pleural effusion. Electronically Signed   By: Newell Eke M.D.   On: 02/21/2024 15:42    Microbiology: Results for orders placed or performed during the hospital encounter of 03/23/23  SARS Coronavirus 2 by RT PCR (hospital order, performed in [facility] hospital lab) *cepheid single result test* Anterior Nasal Swab     Status: Abnormal   Collection Time: 03/23/23 10:39 PM   Specimen: Anterior Nasal Swab  Result Value Ref Range Status   SARS Coronavirus 2 by RT PCR POSITIVE (A) NEGATIVE Final    Comment: (NOTE) SARS-CoV-2 target nucleic acids are DETECTED  SARS-CoV-2 RNA is generally detectable in upper respiratory specimens  during the acute phase of infection.  Positive results are indicative  of the presence of the identified virus, but do not rule out bacterial infection or co-infection with other pathogens not detected by the test.  Clinical correlation with patient history and  other diagnostic information is necessary to determine patient infection status.  The expected result is negative.  Fact Sheet for Patients:   RoadLapTop.co.za   Fact Sheet for Healthcare Providers:   http://kim-miller.com/    This test is not yet approved or cleared by the [place]  FDA and  has been authorized for detection and/or diagnosis of SARS-CoV-2 by FDA under an Emergency Use Authorization (EUA).  This EUA will remain in effect (meaning this test can be used) for the duration of  the COVID-19 declaration under Section 564(b)(1)  of the Act, 21 U.S.C. section 360-bbb-3(b)(1), unless the authorization is terminated or revoked  sooner.   Performed at [facility], [address], [place], [place] [address]     Labs: CBC: No results for input(s): WBC, NEUTROABS, HGB, HCT, MCV, PLT in the last 168 hours.  Basic Metabolic Panel: No results for input(s): NA, K, CL, CO2, GLUCOSE, BUN, CREATININE, CALCIUM , MG, PHOS in the last 168 hours.  Liver Function Tests: No results for input(s): AST, ALT, ALKPHOS, BILITOT, PROT, ALBUMIN  in the last 168 hours.  CBG: No results for input(s): GLUCAP in the last 168 hours.  Discharge time spent: greater than 30 minutes.  Signed: Owen DELENA Lore, MD [name] 03/04/2024

## 2024-01-29 ENCOUNTER — Ambulatory Visit: Payer: PPO | Admitting: Physician Assistant

## 2024-01-29 DIAGNOSIS — I609 Nontraumatic subarachnoid hemorrhage, unspecified: Secondary | ICD-10-CM | POA: Diagnosis not present

## 2024-01-29 DIAGNOSIS — E876 Hypokalemia: Secondary | ICD-10-CM | POA: Diagnosis not present

## 2024-01-29 DIAGNOSIS — E119 Type 2 diabetes mellitus without complications: Secondary | ICD-10-CM | POA: Diagnosis not present

## 2024-01-29 DIAGNOSIS — G9340 Encephalopathy, unspecified: Secondary | ICD-10-CM | POA: Diagnosis not present
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
## 2024-02-10 ENCOUNTER — Ambulatory Visit: Attending: Internal Medicine | Admitting: Cardiology

## 2024-02-10 VITALS — BP 124/70 | HR 56 | Ht 70.0 in | Wt 156.0 lb

## 2024-02-10 DIAGNOSIS — I4821 Permanent atrial fibrillation: Secondary | ICD-10-CM | POA: Diagnosis not present

## 2024-02-10 DIAGNOSIS — I629 Nontraumatic intracranial hemorrhage, unspecified: Secondary | ICD-10-CM | POA: Diagnosis not present

## 2024-02-10 DIAGNOSIS — I680 Cerebral amyloid angiopathy: Secondary | ICD-10-CM

## 2024-02-10 DIAGNOSIS — Z8673 Personal history of transient ischemic attack (TIA), and cerebral infarction without residual deficits: Secondary | ICD-10-CM | POA: Diagnosis not present

## 2024-02-10 DIAGNOSIS — D6869 Other thrombophilia: Secondary | ICD-10-CM | POA: Diagnosis not present

## 2024-02-10 NOTE — Patient Instructions (Signed)
 Medication Instructions:  Your physician recommends that you continue on your current medications as directed. Please refer to the Current Medication list given to you today.  *If you need a refill on your cardiac medications before your next appointment, please call your pharmacy*  Testing/Procedures: Watchman Your physician has requested that you have Left atrial appendage (LAA) closure device implantation is a procedure to put a small device in the LAA of the heart. The LAA is a small sac in the wall of the heart's left upper chamber. Blood clots can form in this area. The device, Watchman closes the LAA to help prevent a blood clot and stroke.  Please contact Nurse Navigator, Katy at [phone] if you would like to proceed with scheduling.    Follow-Up: At [facility], you and your health needs are our priority.  As part of our continuing mission to provide you with exceptional heart care, our providers are all part of one team.  This team includes your primary Cardiologist (physician) and Advanced Practice Providers or APPs (Physician Assistants and Nurse Practitioners) who all work together to provide you with the care you need, when you need it.

## 2024-02-12 ENCOUNTER — Ambulatory Visit: Admitting: Cardiology

## 2024-02-16 ENCOUNTER — Telehealth: Payer: Self-pay

## 2024-02-16 DIAGNOSIS — I629 Nontraumatic intracranial hemorrhage, unspecified: Secondary | ICD-10-CM

## 2024-02-16 DIAGNOSIS — I4821 Permanent atrial fibrillation: Secondary | ICD-10-CM

## 2024-02-16 NOTE — Telephone Encounter (Signed)
 The patient's daughter, [name], called to inform they had a family discussion and Calvin Burnett wants to proceed with LAAO. BMET was completed 7/10.  Scheduled the patient for cCT 8/18. [name] understood instructions will be sent via MyChart. Reiterated she will be called after Dr. Kennyth reviews CT to schedule procedure and start Eliquis  2.5 mg BID 3 weeks prior (pending Neuro clearance). She was grateful for call and agreed with plan.

## 2024-02-16 NOTE — Addendum Note (Signed)
 Addended by: Quayshawn Nin A on: 02/16/2024 05:06 PM   Modules accepted: Orders

## 2024-02-20 ENCOUNTER — Telehealth: Payer: Self-pay

## 2024-02-20 NOTE — Transitions of Care (Post Inpatient/ED Visit) (Signed)
   02/20/2024  Name: Calvin Burnett MRN: [id number] DOB: Nov 09, 1933  Today's TOC FU Call Status: Today's TOC FU Call Status:: Successful TOC FU Call Completed TOC FU Call Complete Date: 02/20/24 Patient's Name and Date of Birth confirmed.  Transition Care Management Follow-up Telephone Call Date of Discharge: 02/19/24 Discharge Facility: Other ([facility]) Name of Other ([facility]) [facility]charge Facility: [facility] Type of Discharge: Inpatient Admission Primary Inpatient Discharge Diagnosis:: encephalopathy How have you been since you were released from the hospital?: Better Any questions or concerns?: No  Items Reviewed: Did you receive and understand the discharge instructions provided?: Yes Medications obtained,verified, and reconciled?: Yes (Medications Reviewed) Any new allergies since your discharge?: No Dietary orders reviewed?: Yes  Medications Reviewed Today: Medications Reviewed Today     Reviewed by Emmitt Pan, LPN (Licensed Practical Nurse) on 02/20/24 at 1136  Med List Status: <None>   Medication Order Taking? Sig Documenting Provider Last Dose Status Informant  amLODipine  (NORVASC ) 10 MG tablet [id number] Yes Take 1 tablet (10 mg total) by mouth daily. Regalado, Belkys A, MD  Active   cyanocobalamin  1000 MCG tablet [id number] Yes Take 1,000 mcg by mouth 2 (two) times daily. [provider]  Active Self, Child, Pharmacy Records  escitalopram  (LEXAPRO ) 5 MG tablet [id number] Yes Take 1 tablet (5 mg total) by mouth daily. Regalado, Belkys A, MD  Active   latanoprost  (XALATAN ) 0.005 % ophthalmic solution [id number] Yes Place 1 drop into both eyes at bedtime.  [provider]  Active Self, Child, Pharmacy Records           Med Note LEONARDO, [facility]   Wed Dec 04, 2022 12:54 PM)    melatonin 3 MG TABS tablet [id number] Yes Take 2 tablets (6 mg total) by mouth at bedtime as needed. Regalado, Belkys A, MD  Active   metFORMIN  (GLUCOPHAGE -XR) 500 MG  24 hr tablet [id number] Yes TAKE 1 TABLET BY MOUTH EVERY DAY WITH BREAKFAST Katrinka Garnette KIDD, MD  Active Self, Child, Pharmacy Records  rosuvastatin  (CRESTOR ) 20 MG tablet [id number] Yes Take 1 tablet (20 mg total) by mouth 2 (two) times a week. Katrinka Garnette KIDD, MD  Active Self, Child, Pharmacy Records            Home Care and Equipment/Supplies: Were Home Health Services Ordered?: Yes Name of Home Health Agency:: [facility] Has Agency set up a time to come to your home?: Yes First Home Health Visit Date: 02/20/24 Any new equipment or medical supplies ordered?: NA  Functional Questionnaire: Do you need assistance with bathing/showering or dressing?: No Do you need assistance with meal preparation?: No Do you need assistance with eating?: No Do you have difficulty maintaining continence: No Do you need assistance with getting out of bed/getting out of a chair/moving?: No Do you have difficulty managing or taking your medications?: No  Follow up appointments reviewed: PCP Follow-up appointment confirmed?: No (not avail appt , sent message to staff to schedule) MD Provider Line Number:[phone] Given: No Specialist Hospital Follow-up appointment confirmed?: NA Do you need transportation to your follow-up appointment?: No Do you understand care options if your condition(s) worsen?: Yes-patient verbalized understanding    SIGNATURE Pan Emmitt, LPN [facility] Nurse Health Advisor Direct Dial [phone]

## 2024-02-21 ENCOUNTER — Inpatient Hospital Stay (HOSPITAL_BASED_OUTPATIENT_CLINIC_OR_DEPARTMENT_OTHER)
Admission: EM | Admit: 2024-02-21 | Discharge: 2024-02-24 | DRG: 291 | Disposition: A | Discharge status: Home Health | Attending: Internal Medicine | Admitting: Internal Medicine

## 2024-02-21 ENCOUNTER — Emergency Department (HOSPITAL_BASED_OUTPATIENT_CLINIC_OR_DEPARTMENT_OTHER)

## 2024-02-21 ENCOUNTER — Encounter (HOSPITAL_BASED_OUTPATIENT_CLINIC_OR_DEPARTMENT_OTHER): Payer: Self-pay

## 2024-02-21 ENCOUNTER — Emergency Department (HOSPITAL_BASED_OUTPATIENT_CLINIC_OR_DEPARTMENT_OTHER): Admitting: Radiology

## 2024-02-21 ENCOUNTER — Other Ambulatory Visit: Payer: Self-pay

## 2024-02-21 DIAGNOSIS — Z7901 Long term (current) use of anticoagulants: Secondary | ICD-10-CM | POA: Diagnosis not present

## 2024-02-21 DIAGNOSIS — E785 Hyperlipidemia, unspecified: Secondary | ICD-10-CM | POA: Diagnosis not present

## 2024-02-21 DIAGNOSIS — E1169 Type 2 diabetes mellitus with other specified complication: Secondary | ICD-10-CM | POA: Diagnosis present

## 2024-02-21 DIAGNOSIS — D696 Thrombocytopenia, unspecified: Secondary | ICD-10-CM | POA: Diagnosis present

## 2024-02-21 DIAGNOSIS — I130 Hypertensive heart and chronic kidney disease with heart failure and stage 1 through stage 4 chronic kidney disease, or unspecified chronic kidney disease: Principal | ICD-10-CM | POA: Diagnosis present

## 2024-02-21 DIAGNOSIS — K219 Gastro-esophageal reflux disease without esophagitis: Secondary | ICD-10-CM | POA: Diagnosis present

## 2024-02-21 DIAGNOSIS — R001 Bradycardia, unspecified: Secondary | ICD-10-CM | POA: Diagnosis not present

## 2024-02-21 DIAGNOSIS — I5033 Acute on chronic diastolic (congestive) heart failure: Secondary | ICD-10-CM | POA: Diagnosis not present

## 2024-02-21 DIAGNOSIS — R519 Headache, unspecified: Secondary | ICD-10-CM | POA: Diagnosis not present

## 2024-02-21 DIAGNOSIS — E1122 Type 2 diabetes mellitus with diabetic chronic kidney disease: Secondary | ICD-10-CM | POA: Diagnosis not present

## 2024-02-21 DIAGNOSIS — E119 Type 2 diabetes mellitus without complications: Secondary | ICD-10-CM

## 2024-02-21 DIAGNOSIS — I4821 Permanent atrial fibrillation: Secondary | ICD-10-CM | POA: Diagnosis present

## 2024-02-21 DIAGNOSIS — Z79899 Other long term (current) drug therapy: Secondary | ICD-10-CM | POA: Diagnosis not present

## 2024-02-21 DIAGNOSIS — I672 Cerebral atherosclerosis: Secondary | ICD-10-CM | POA: Diagnosis not present

## 2024-02-21 DIAGNOSIS — F325 Major depressive disorder, single episode, in full remission: Secondary | ICD-10-CM | POA: Diagnosis present

## 2024-02-21 DIAGNOSIS — D631 Anemia in chronic kidney disease: Secondary | ICD-10-CM | POA: Diagnosis not present

## 2024-02-21 DIAGNOSIS — R600 Localized edema: Secondary | ICD-10-CM | POA: Diagnosis not present

## 2024-02-21 DIAGNOSIS — N1831 Chronic kidney disease, stage 3a: Secondary | ICD-10-CM | POA: Diagnosis not present

## 2024-02-21 DIAGNOSIS — I4819 Other persistent atrial fibrillation: Secondary | ICD-10-CM

## 2024-02-21 DIAGNOSIS — Z85828 Personal history of other malignant neoplasm of skin: Secondary | ICD-10-CM | POA: Diagnosis not present

## 2024-02-21 DIAGNOSIS — Z825 Family history of asthma and other chronic lower respiratory diseases: Secondary | ICD-10-CM

## 2024-02-21 DIAGNOSIS — Z87891 Personal history of nicotine dependence: Secondary | ICD-10-CM

## 2024-02-21 DIAGNOSIS — Z801 Family history of malignant neoplasm of trachea, bronchus and lung: Secondary | ICD-10-CM

## 2024-02-21 DIAGNOSIS — Z833 Family history of diabetes mellitus: Secondary | ICD-10-CM

## 2024-02-21 DIAGNOSIS — J90 Pleural effusion, not elsewhere classified: Secondary | ICD-10-CM | POA: Diagnosis not present

## 2024-02-21 DIAGNOSIS — I10 Essential (primary) hypertension: Secondary | ICD-10-CM | POA: Diagnosis present

## 2024-02-21 DIAGNOSIS — Z8673 Personal history of transient ischemic attack (TIA), and cerebral infarction without residual deficits: Secondary | ICD-10-CM | POA: Diagnosis not present

## 2024-02-21 DIAGNOSIS — I63511 Cerebral infarction due to unspecified occlusion or stenosis of right middle cerebral artery: Secondary | ICD-10-CM | POA: Diagnosis not present

## 2024-02-21 DIAGNOSIS — Z7984 Long term (current) use of oral hypoglycemic drugs: Secondary | ICD-10-CM | POA: Diagnosis not present

## 2024-02-21 DIAGNOSIS — I340 Nonrheumatic mitral (valve) insufficiency: Secondary | ICD-10-CM | POA: Diagnosis not present

## 2024-02-21 DIAGNOSIS — I4891 Unspecified atrial fibrillation: Secondary | ICD-10-CM | POA: Diagnosis not present

## 2024-02-21 DIAGNOSIS — I5030 Unspecified diastolic (congestive) heart failure: Secondary | ICD-10-CM

## 2024-02-21 DIAGNOSIS — I609 Nontraumatic subarachnoid hemorrhage, unspecified: Secondary | ICD-10-CM

## 2024-02-21 DIAGNOSIS — M7989 Other specified soft tissue disorders: Secondary | ICD-10-CM | POA: Diagnosis not present

## 2024-02-21 DIAGNOSIS — I5082 Biventricular heart failure: Secondary | ICD-10-CM | POA: Diagnosis not present

## 2024-02-21 DIAGNOSIS — Z85038 Personal history of other malignant neoplasm of large intestine: Secondary | ICD-10-CM

## 2024-02-21 DIAGNOSIS — J9811 Atelectasis: Secondary | ICD-10-CM | POA: Diagnosis not present

## 2024-02-21 DIAGNOSIS — Z9049 Acquired absence of other specified parts of digestive tract: Secondary | ICD-10-CM

## 2024-02-21 DIAGNOSIS — F0390 Unspecified dementia without behavioral disturbance: Secondary | ICD-10-CM | POA: Diagnosis not present

## 2024-02-21 DIAGNOSIS — Z888 Allergy status to other drugs, medicaments and biological substances status: Secondary | ICD-10-CM

## 2024-02-21 DIAGNOSIS — Z8546 Personal history of malignant neoplasm of prostate: Secondary | ICD-10-CM

## 2024-02-21 DIAGNOSIS — I700 Atherosclerosis of aorta: Secondary | ICD-10-CM | POA: Diagnosis not present

## 2024-02-21 DIAGNOSIS — N179 Acute kidney failure, unspecified: Secondary | ICD-10-CM | POA: Diagnosis not present

## 2024-02-21 DIAGNOSIS — H8110 Benign paroxysmal vertigo, unspecified ear: Secondary | ICD-10-CM | POA: Diagnosis present

## 2024-02-21 DIAGNOSIS — E876 Hypokalemia: Secondary | ICD-10-CM | POA: Diagnosis present

## 2024-02-21 DIAGNOSIS — Z809 Family history of malignant neoplasm, unspecified: Secondary | ICD-10-CM

## 2024-02-21 DIAGNOSIS — I350 Nonrheumatic aortic (valve) stenosis: Secondary | ICD-10-CM | POA: Diagnosis not present

## 2024-02-21 DIAGNOSIS — J811 Chronic pulmonary edema: Secondary | ICD-10-CM | POA: Diagnosis not present

## 2024-02-21 DIAGNOSIS — I680 Cerebral amyloid angiopathy: Secondary | ICD-10-CM | POA: Diagnosis present

## 2024-02-21 DIAGNOSIS — I080 Rheumatic disorders of both mitral and aortic valves: Secondary | ICD-10-CM | POA: Diagnosis not present

## 2024-02-21 DIAGNOSIS — N189 Chronic kidney disease, unspecified: Secondary | ICD-10-CM | POA: Insufficient documentation

## 2024-02-21 DIAGNOSIS — E8770 Fluid overload, unspecified: Secondary | ICD-10-CM | POA: Diagnosis not present

## 2024-02-21 DIAGNOSIS — N1832 Chronic kidney disease, stage 3b: Secondary | ICD-10-CM | POA: Diagnosis not present

## 2024-02-21 LAB — CBC WITH DIFFERENTIAL/PLATELET
Abs Immature Granulocytes: 0.02 K/uL (ref 0.00–0.07)
Basophils Absolute: 0.1 K/uL (ref 0.0–0.1)
Basophils Relative: 1 %
Eosinophils Absolute: 0.1 K/uL (ref 0.0–0.5)
Eosinophils Relative: 2 %
HCT: 37 % — ABNORMAL LOW (ref 39.0–52.0)
Hemoglobin: 12.8 g/dL — ABNORMAL LOW (ref 13.0–17.0)
Immature Granulocytes: 0 %
Lymphocytes Relative: 31 %
Lymphs Abs: 1.6 K/uL (ref 0.7–4.0)
MCH: 32.2 pg (ref 26.0–34.0)
MCHC: 34.6 g/dL (ref 30.0–36.0)
MCV: 93 fL (ref 80.0–100.0)
Monocytes Absolute: 0.5 K/uL (ref 0.1–1.0)
Monocytes Relative: 10 %
Neutro Abs: 2.7 K/uL (ref 1.7–7.7)
Neutrophils Relative %: 56 %
Platelets: 143 K/uL — ABNORMAL LOW (ref 150–400)
RBC: 3.98 MIL/uL — ABNORMAL LOW (ref 4.22–5.81)
RDW: 14.2 % (ref 11.5–15.5)
WBC: 4.9 K/uL (ref 4.0–10.5)
nRBC: 0 % (ref 0.0–0.2)

## 2024-02-21 LAB — PRO BRAIN NATRIURETIC PEPTIDE: Pro Brain Natriuretic Peptide: 1026 pg/mL — ABNORMAL HIGH (ref <300.0)

## 2024-02-21 LAB — BASIC METABOLIC PANEL WITH GFR
Anion gap: 12 (ref 5–15)
BUN: 25 mg/dL — ABNORMAL HIGH (ref 8–23)
CO2: 24 mmol/L (ref 22–32)
Calcium: 9.2 mg/dL (ref 8.9–10.3)
Chloride: 107 mmol/L (ref 98–111)
Creatinine, Ser: 1.49 mg/dL — ABNORMAL HIGH (ref 0.61–1.24)
GFR, Estimated: 45 mL/min — ABNORMAL LOW (ref >60)
Glucose, Bld: 114 mg/dL — ABNORMAL HIGH (ref 70–99)
Potassium: 4.1 mmol/L (ref 3.5–5.1)
Sodium: 143 mmol/L (ref 135–145)

## 2024-02-21 MED ORDER — ROSUVASTATIN CALCIUM 20 MG PO TABS
20.0000 mg | ORAL_TABLET | ORAL | Status: DC
  Administered 2024-02-23 (×2): 20 mg via ORAL
  Filled 2024-02-21 (×2): qty 1

## 2024-02-21 MED ORDER — SODIUM CHLORIDE 0.9% FLUSH
3.0000 mL | Freq: Two times a day (BID) | INTRAVENOUS | Status: DC
  Administered 2024-02-21 – 2024-02-24 (×9): 3 mL via INTRAVENOUS

## 2024-02-21 MED ORDER — MELATONIN 3 MG PO TABS: 6.0000 mg | ORAL_TABLET | Freq: Every evening | ORAL | Status: DC | PRN

## 2024-02-21 MED ORDER — FUROSEMIDE 10 MG/ML IJ SOLN
40.0000 mg | Freq: Two times a day (BID) | INTRAMUSCULAR | Status: DC
  Administered 2024-02-21 – 2024-02-22 (×3): 40 mg via INTRAVENOUS
  Filled 2024-02-21 (×3): qty 4

## 2024-02-21 MED ORDER — ESCITALOPRAM OXALATE 10 MG PO TABS
5.0000 mg | ORAL_TABLET | Freq: Every day | ORAL | Status: DC
  Administered 2024-02-21 – 2024-02-24 (×6): 5 mg via ORAL
  Filled 2024-02-21 (×4): qty 1

## 2024-02-21 MED ORDER — SODIUM CHLORIDE 0.9% FLUSH: 3.0000 mL | INTRAVENOUS | Status: DC | PRN

## 2024-02-21 MED ORDER — AMLODIPINE BESYLATE 10 MG PO TABS
10.0000 mg | ORAL_TABLET | Freq: Every day | ORAL | Status: DC
  Administered 2024-02-21 – 2024-02-24 (×6): 10 mg via ORAL
  Filled 2024-02-21 (×4): qty 1

## 2024-02-21 MED ORDER — SODIUM CHLORIDE 0.9 % IV SOLN: 250.0000 mL | INTRAVENOUS | Status: AC | PRN

## 2024-02-21 MED ORDER — ONDANSETRON HCL 4 MG/2ML IJ SOLN: 4.0000 mg | Freq: Four times a day (QID) | INTRAMUSCULAR | Status: DC | PRN

## 2024-02-21 MED ORDER — LATANOPROST 0.005 % OP SOLN
1.0000 [drp] | Freq: Every day | OPHTHALMIC | Status: DC
  Administered 2024-02-21 – 2024-02-23 (×4): 1 [drp] via OPHTHALMIC
  Filled 2024-02-21: qty 2.5

## 2024-02-21 MED ORDER — FUROSEMIDE 10 MG/ML IJ SOLN
20.0000 mg | Freq: Once | INTRAMUSCULAR | Status: AC
  Administered 2024-02-21: 20 mg via INTRAVENOUS
  Filled 2024-02-21: qty 2

## 2024-02-21 MED ORDER — ACETAMINOPHEN 325 MG PO TABS
650.0000 mg | ORAL_TABLET | ORAL | Status: DC | PRN
  Administered 2024-02-24 (×2): 650 mg via ORAL
  Filled 2024-02-21: qty 2

## 2024-02-21 NOTE — ED Triage Notes (Addendum)
 Arrives POV with complaints of bilateral leg swelling x3 days.  No pain or falls.  Patient was recently discharged from rehab for stroke.    Hx of chronic Atrial Fib, stroke, and dementia.

## 2024-02-21 NOTE — ED Provider Notes (Signed)
 Calvin Burnett EMERGENCY DEPARTMENT AT [facility] Provider Note   CSN: [id number] Arrival date & time: 02/21/24  1355     Patient presents with: Leg Swelling   Calvin Burnett is a 89 y.o. male here for evaluation of bilateral lower extremity swelling.  He has a history of vascular dementia family in room helps provide history.  Recently had hemorrhagic stroke about a month ago was subsequently discharged to [facility] rehab.  He was released on Thursday.  Once he was released family noted that he had bilateral lower extremity edema.  Unknown when this started.  He denies any pain.  No shortness of breath.  No recent falls or injuries.  Family states he has heart problems however denies any history of CHF.  He is not on any Lasix .  Does have a history of CKD.  He was taken off of his anticoagulation for his A-fib after his hemorrhagic stroke.  Baseline mentation per family.  No fever, cough, shortness of breath, chest pain, abdominal pain, numbness or weakness.  He was ambulatory here.  Dr. Ronni with Cards  ECHO Ef 50- mod/severe Aortic stenosis, Aortic regur mild, moder mitral valve   HPI     Prior to Admission medications   Medication Sig Start Date End Date Taking? Authorizing Provider  amLODipine  (NORVASC ) 10 MG tablet Take 1 tablet (10 mg total) by mouth daily. 01/22/24   Regalado, Belkys A, MD  cyanocobalamin  1000 MCG tablet Take 1,000 mcg by mouth 2 (two) times daily.    [provider]  escitalopram  (LEXAPRO ) 5 MG tablet Take 1 tablet (5 mg total) by mouth daily. 01/22/24   Regalado, Belkys A, MD  latanoprost  (XALATAN ) 0.005 % ophthalmic solution Place 1 drop into both eyes at bedtime.  08/05/15   [provider]  melatonin 3 MG TABS tablet Take 2 tablets (6 mg total) by mouth at bedtime as needed. 01/22/24   Regalado, Belkys A, MD  metFORMIN  (GLUCOPHAGE -XR) 500 MG 24 hr tablet TAKE 1 TABLET BY MOUTH EVERY DAY WITH BREAKFAST 06/10/23   Katrinka Garnette KIDD, MD  rosuvastatin  (CRESTOR ) 20 MG tablet Take 1 tablet (20 mg total) by mouth 2 (two) times a week. 11/17/23   Katrinka Garnette KIDD, MD    Allergies: Kcentra  [prothrombin  complex conc human]    Review of Systems  Constitutional: Negative.   HENT: Negative.    Respiratory: Negative.    Cardiovascular: Negative.   Gastrointestinal: Negative.   Genitourinary: Negative.   Musculoskeletal:        Lower extremity edema  Skin: Negative.   Neurological: Negative.   All other systems reviewed and are negative.   Updated Vital Signs BP 138/67 (BP Location: Right Arm)   Pulse 61   Temp 98.1 F (36.7 C) (Oral)   Resp 20   Ht 5' 10 (1.778 m)   Wt 70.8 kg   SpO2 100%   BMI 22.40 kg/m   Physical Exam Vitals and nursing note reviewed.  Constitutional:      General: He is not in acute distress.    Appearance: He is well-developed. He is not ill-appearing, toxic-appearing or diaphoretic.  HENT:     Head: Normocephalic and atraumatic.     Nose: Nose normal.  Eyes:     Pupils: Pupils are equal, round, and reactive to light.  Cardiovascular:     Rate and Rhythm: Normal rate and regular rhythm.     Pulses: Normal pulses.  Radial pulses are 2+ on the right side and 2+ on the left side.       Dorsalis pedis pulses are 2+ on the right side and 2+ on the left side.     Heart sounds: Normal heart sounds.  Pulmonary:     Effort: Pulmonary effort is normal. No respiratory distress.     Breath sounds: Normal breath sounds.  Abdominal:     General: Bowel sounds are normal. There is no distension.     Palpations: Abdomen is soft.     Tenderness: There is no abdominal tenderness. There is no right CVA tenderness, left CVA tenderness or guarding.  Musculoskeletal:        General: No swelling, tenderness, deformity or signs of injury. Normal range of motion.     Cervical back: Normal range of motion and neck supple.     Right lower leg: Edema present.     Left lower leg: Edema  present.     Comments: No bony tenderness, compartments soft.  Nontender calves bilaterally.  2+ pitting edema symmetric bilateral lower extremities.  Feet:     Right foot:     Skin integrity: Skin integrity normal.     Toenail Condition: Right toenails are long.     Left foot:     Skin integrity: Skin integrity normal.     Toenail Condition: Left toenails are long.  Skin:    General: Skin is warm and dry.     Comments: No erythema, warmth, rashes or lesions.  Neurological:     General: No focal deficit present.     Mental Status: He is alert and oriented to person, place, and time.     Comments: Cranial nerves II through XII grossly intact Equal strength Intact sensation Ambulatory     (all labs ordered are listed, but only abnormal results are displayed) Labs Reviewed  CBC WITH DIFFERENTIAL/PLATELET  BASIC METABOLIC PANEL WITH GFR  PRO BRAIN NATRIURETIC PEPTIDE    EKG: None  Radiology: No results found.   .Critical Care  Performed by: Edie Rosebud LABOR, PA-C Authorized by: Edie Rosebud LABOR, PA-C   Critical care provider statement:    Critical care time (minutes):  35   Critical care was necessary to treat or prevent imminent or life-threatening deterioration of the following conditions:  Cardiac failure (Bradycardia requiring intervention and admission)   Critical care was time spent personally by me on the following activities:  Development of treatment plan with patient or surrogate, discussions with consultants, evaluation of patient's response to treatment, examination of patient, ordering and review of laboratory studies, ordering and review of radiographic studies, ordering and performing treatments and interventions, pulse oximetry, re-evaluation of patient's condition and review of old charts    Medications Ordered in the ED - No data to display  89 year old with multiple medical comorbidities here for evaluation of lower extremity edema.  Recent  hemorrhagic stroke about a month ago subsequently discharged to rehab and was released 3 days ago.  Family noted when they picked him up that he had some lower extremity edema.  Patient has history of dementia, at baseline per family however he is unsure when this started.  No recent injuries or falls.  He is afebrile, nonseptic, non-ill-appearing.  Was on DOAC for A-fib taken off of due to hemorrhagic stroke.  No chest pain or shortness of breath.  He does have symmetric pitting edema to his lower extremities without any overlying skin changes.  His compartments are soft.  Nontender calves.  Will plan on labs, imaging and reassess  Labs and imaging personally viewed and interpreted:  CBC wo acute abnormality BMP creatinine 1.49 BNP 1026 Chest x-ray small pleural effusion DVT ultrasound bilateral neg EKG slow Afib rate 39  Patient reassessed. Daughter states that patient had a headache earlier in the week. She is concerned due to his recent bleed. Will get head CT.  Patient reassessed. Persistently bradycardic into the mid 30's.  A-fib slow rate.  Will be admitted for further workup and management.  Patient and family agreeable.  Was given low-dose Lasix  due to his lower extremity edema  The patient appears reasonably stabilized for admission considering the current resources, flow, and capabilities available in the ED at this time, and I doubt any other [facility] requiring further screening and/or treatment in the ED prior to admission.   Clinical Course as of 02/21/24 2218  Sat Feb 21, 2024  1645 Dr. Waddell with Cards rec, med admit, will see in consult [BH]  1656 Dr. Perri with medicine for admission [BH]    Clinical Course User Index [BH] Maddelyn Rocca A, PA-C                                 Medical Decision Making Amount and/or Complexity of Data Reviewed Independent Historian: caregiver    Details: Family in room External Data Reviewed: labs, radiology, ECG and notes. Labs:  ordered. Decision-making details documented in ED Course. Radiology: ordered and independent interpretation performed. Decision-making details documented in ED Course. ECG/medicine tests: ordered and independent interpretation performed. Decision-making details documented in ED Course.  Risk OTC drugs. Prescription drug management. Decision regarding hospitalization. Diagnosis or treatment significantly limited by social determinants of health.       Final diagnoses:  Bradycardia  Leg edema  Persistent atrial fibrillation [facility])    ED Discharge Orders     None          Caylen Yardley A, PA-C 02/21/24 2220    Lenor Hollering, MD 02/22/24 [phone]

## 2024-02-21 NOTE — ED Notes (Signed)
 [facility] at bedside

## 2024-02-21 NOTE — ED Notes (Signed)
 Called [facility] for transport

## 2024-02-21 NOTE — ED Notes (Signed)
 RT ambulated the Pt on RA. SATS 94% and HR 56

## 2024-02-21 NOTE — Treatment Plan (Addendum)
 [facility]B to [facility] hospitalist   89 yo with hx T2DM, dementia, afib, HTN, colon cancer and other medical issues recently discharged 7/10 in setting of Jaun Galluzzo syncopal episode.  He was found to have SAH and his anticoagulation was discontinued.  His beta blocker was discontinued in the setting of bradycardia.  He saw EP 7/29 with consideration of watchman device.  He presented today with bilateral lower extremity swelling.  No LH, dizziness, SOB, CP.  EKG shows afib with SVR.  Labs notable for elevated creatinine, anemia, thrombocytopenia, and elevated pro BNP.  LE US  negative for DVT.  Head CT shows subcentimeter hyperdense foci in the left occipital lobe, diminished from prior CT (resolving hemorrhage).  Plain films with small R effusion and trace L effusion.    EDP requesting admit for HF/volume overload and bradycardia/slow ventricular response.  Cards, Dr. Waddell, recommending hospitalist admit and cards will consult.   Will admit to cards tele.

## 2024-02-21 NOTE — Plan of Care (Signed)
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
## 2024-02-22 ENCOUNTER — Other Ambulatory Visit: Payer: Self-pay | Admitting: Family Medicine

## 2024-02-22 DIAGNOSIS — I10 Essential (primary) hypertension: Secondary | ICD-10-CM | POA: Diagnosis not present

## 2024-02-22 DIAGNOSIS — F0390 Unspecified dementia without behavioral disturbance: Secondary | ICD-10-CM

## 2024-02-22 DIAGNOSIS — K219 Gastro-esophageal reflux disease without esophagitis: Secondary | ICD-10-CM

## 2024-02-22 DIAGNOSIS — E1122 Type 2 diabetes mellitus with diabetic chronic kidney disease: Secondary | ICD-10-CM

## 2024-02-22 DIAGNOSIS — I350 Nonrheumatic aortic (valve) stenosis: Secondary | ICD-10-CM

## 2024-02-22 DIAGNOSIS — I5033 Acute on chronic diastolic (congestive) heart failure: Secondary | ICD-10-CM | POA: Diagnosis not present

## 2024-02-22 DIAGNOSIS — I5030 Unspecified diastolic (congestive) heart failure: Secondary | ICD-10-CM

## 2024-02-22 DIAGNOSIS — N179 Acute kidney failure, unspecified: Secondary | ICD-10-CM

## 2024-02-22 DIAGNOSIS — N189 Chronic kidney disease, unspecified: Secondary | ICD-10-CM

## 2024-02-22 DIAGNOSIS — I609 Nontraumatic subarachnoid hemorrhage, unspecified: Secondary | ICD-10-CM

## 2024-02-22 DIAGNOSIS — R001 Bradycardia, unspecified: Secondary | ICD-10-CM

## 2024-02-22 DIAGNOSIS — I4821 Permanent atrial fibrillation: Secondary | ICD-10-CM

## 2024-02-22 DIAGNOSIS — I4891 Unspecified atrial fibrillation: Secondary | ICD-10-CM | POA: Diagnosis not present

## 2024-02-22 LAB — HEPATIC FUNCTION PANEL
ALT: 17 U/L (ref 0–44)
AST: 23 U/L (ref 15–41)
Albumin: 3.1 g/dL — ABNORMAL LOW (ref 3.5–5.0)
Alkaline Phosphatase: 81 U/L (ref 38–126)
Bilirubin, Direct: 0.2 mg/dL (ref 0.0–0.2)
Indirect Bilirubin: 0.8 mg/dL (ref 0.3–0.9)
Total Bilirubin: 1 mg/dL (ref 0.0–1.2)
Total Protein: 6.5 g/dL (ref 6.5–8.1)

## 2024-02-22 LAB — BASIC METABOLIC PANEL WITH GFR
Anion gap: 11 (ref 5–15)
BUN: 24 mg/dL — ABNORMAL HIGH (ref 8–23)
CO2: 25 mmol/L (ref 22–32)
Calcium: 8.6 mg/dL — ABNORMAL LOW (ref 8.9–10.3)
Chloride: 105 mmol/L (ref 98–111)
Creatinine, Ser: 1.65 mg/dL — ABNORMAL HIGH (ref 0.61–1.24)
GFR, Estimated: 40 mL/min — ABNORMAL LOW (ref >60)
Glucose, Bld: 106 mg/dL — ABNORMAL HIGH (ref 70–99)
Potassium: 3.2 mmol/L — ABNORMAL LOW (ref 3.5–5.1)
Sodium: 141 mmol/L (ref 135–145)

## 2024-02-22 MED ORDER — POTASSIUM CHLORIDE 20 MEQ PO PACK
40.0000 meq | PACK | Freq: Two times a day (BID) | ORAL | Status: DC
  Administered 2024-02-22 – 2024-02-24 (×8): 40 meq via ORAL
  Filled 2024-02-22 (×5): qty 2
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
## 2024-02-22 NOTE — Hospital Course (Addendum)
 89 yo with hx T2DM, dementia, afib, HTN, colon cancer and other medical issues recently discharged 7/10 in setting of a syncopal episode. He was found to have SAH and his anticoagulation was discontinued. His beta blocker was discontinued in the setting of bradycardia. He saw EP 7/29 with consideration of watchman device. He presented today with bilateral lower extremity swelling.    Assessment and Plan:   Acute exacerbation of HFpEF - Etiology likely multifactorial.  Previous echo 7/6 noting EF 50-55%, severe BIL, moderate-severe AS.  Elevated proBNP.  Chest x-ray personally reviewed noting congestion bilaterally.  Also noted bradycardia with rates in the 40s-50s.  Appears to be responding well to initial diuresis, -2.8 L so far.  Cardiology consulted and following closely.  Will continue IV diuresis for now.  Monitor I's and O's, fluid restriction.  Follow electrolytes.   Hypokalemia - Exacerbated by diuresis.  Replenishment on board.  Will recheck BMP and magnesium  in AM.   Permanent atrial fibrillation with bradycardia - Rate 40s-50s on presentation.  Possibly exacerbated by beta-blocker usage which patient is unclear if he is taking or not.  No current dizziness, blood pressure stable.  Will hold beta-blockers for now.  If rate does not improve, may need to be evaluated for pacemaker placement.  Anticoagulation as below.   Moderate-severe aortic stenosis - Likely contributing to patient's CHF exacerbation and lower extremity edema.  Unlikely to pursue invasive therapy during inpatient hospitalization.   History of subarachnoid hemorrhage - CT head noting resolving hemorrhage.  Based on age and creatinine clearance, Eliquis  dose 2.5 mg twice daily.  Currently on hold.  Patient is also scheduled in the outpatient setting for Watchman procedure.   Acute kidney injury on CKD 3B - Creatinine minimally elevated above baseline.  Will watch closely with diuresis.  Recheck BMP in AM.   Diabetes  mellitus - Insulin  sliding scale on board.   Hypertension/hyperlipidemia - Holding on beta-blockers.  Resume other cardiac regimen.   GERD - PPI on board

## 2024-02-22 NOTE — Progress Notes (Signed)
 Progress Note   Patient: Calvin Burnett FMW:[id number] DOB: 01-16-1934 DOA: 02/21/2024  DOS: the patient was seen and examined on 02/22/2024   Brief hospital course:  89 yo with hx T2DM, dementia, afib, HTN, colon cancer and other medical issues recently discharged 7/10 in setting of a syncopal episode. He was found to have SAH and his anticoagulation was discontinued. His beta blocker was discontinued in the setting of bradycardia. He saw EP 7/29 with consideration of watchman device. He presented today with bilateral lower extremity swelling.   Assessment and Plan:  Acute exacerbation of HFpEF - Etiology likely multifactorial.  Previous echo 7/6 noting EF 50-55%, severe BIL, moderate-severe AS.  Elevated proBNP.  Chest x-ray personally reviewed noting congestion bilaterally.  Also noted bradycardia with rates in the 40s-50s.  Appears to be responding well to initial diuresis, -2.8 L so far.  Cardiology consulted and following closely.  Will continue IV diuresis for now.  Monitor I's and O's, fluid restriction.  Follow electrolytes.  Hypokalemia - Exacerbated by diuresis.  Replenishment on board.  Will recheck BMP and magnesium  in AM.  Permanent atrial fibrillation with bradycardia - Rate 40s-50s on presentation.  Possibly exacerbated by beta-blocker usage which patient is unclear if he is taking or not.  No current dizziness, blood pressure stable.  Will hold beta-blockers for now.  If rate does not improve, may need to be evaluated for pacemaker placement.  Anticoagulation as below.  Moderate-severe aortic stenosis - Likely contributing to patient's CHF exacerbation and lower extremity edema.  Unlikely to pursue invasive therapy during inpatient hospitalization.  History of subarachnoid hemorrhage - CT head noting resolving hemorrhage.  Based on age and creatinine clearance, Eliquis  dose 2.5 mg twice daily.  Currently on hold.  Patient is also scheduled in the outpatient setting for  Watchman procedure.  Acute kidney injury on CKD 3B - Creatinine minimally elevated above baseline.  Will watch closely with diuresis.  Recheck BMP in AM.  Diabetes mellitus - Insulin  sliding scale on board.  Hypertension/hyperlipidemia - Holding on beta-blockers.  Resume other cardiac regimen.  GERD - PPI on board  Subjective: Patient resting comfortably this morning.  Son is at bedside.  Patient denies any worsening shortness of breath, chest pain, nausea, vomiting, abdominal pain.  Did note orthopnea and leg swelling prior to presentation.  Diuresing quite well.  No other complaints at this time.  Physical Exam:  Vitals:   02/21/24 2006 02/22/24 0020 02/22/24 0436 02/22/24 0740  BP: (!) 140/71 118/66 114/71 134/84  Pulse: (!) 45 (!) 51 (!) 47 (!) 101  Resp: 18 19 19 18   Temp: 97.7 F (36.5 C) 97.6 F (36.4 C) 97.9 F (36.6 C) 97.9 F (36.6 C)  TempSrc: Oral Oral Oral Oral  SpO2: 93% 95% 90% 95%  Weight:   69 kg   Height:        GENERAL:  Alert, pleasant, no acute distress  HEENT:  EOMI CARDIOVASCULAR: Irregularly irregular, systolic murmur RESPIRATORY:  Clear to auscultation, no wheezing, rales, or rhonchi GASTROINTESTINAL:  Soft, nontender, nondistended EXTREMITIES: 1-2+ BL LE pitting edema NEURO:  No new focal deficits appreciated SKIN:  No rashes noted PSYCH:  Appropriate mood and affect     Data Reviewed:  Imaging Studies: CT Head Wo Contrast Result Date: 02/21/2024 CLINICAL DATA:  Headache, increasing frequency or severity EXAM: CT HEAD WITHOUT CONTRAST TECHNIQUE: Contiguous axial images were obtained from the base of the skull through the vertex without intravenous contrast. RADIATION DOSE REDUCTION: This exam  was performed according to the departmental dose-optimization program which includes automated exposure control, adjustment of the mA and/or kV according to patient size and/or use of iterative reconstruction technique. COMPARISON:  Brain MRI and head CT  a 01/18/2024 FINDINGS: Brain: Subcentimeter hyperdense foci in the left occipital lobe persists but slightly diminished from prior CT, series 2, images 22 and 25. No evidence of new hemorrhage. Stable degree of atrophy and chronic small vessel ischemia. Chronic right MCA infarct, unchanged in appearance. There is no subdural collection. No midline shift. Vascular: Atherosclerosis of skullbase vasculature without hyperdense vessel or abnormal calcification. Skull: No fracture or focal lesion. Sinuses/Orbits: No acute findings. Again seen partial opacification of right ethmoid air cells. No mastoid effusion. Bilateral cataract resection. Other: None. IMPRESSION: 1. Subcentimeter hyperdense foci in the left occipital lobe persist but slightly diminished from prior CT, likely resolving hemorrhage. No evidence of new hemorrhage or other acute findings. 2. Stable atrophy, chronic small vessel ischemia, and chronic right MCA infarct. Electronically Signed   By: Andrea Gasman M.D.   On: 02/21/2024 16:14   US  Venous Img Lower Bilateral Result Date: 02/21/2024 CLINICAL DATA:  Bilateral lower leg swelling for 3 days. EXAM: BILATERAL LOWER EXTREMITY VENOUS DOPPLER ULTRASOUND TECHNIQUE: Gray-scale sonography with graded compression, as well as color Doppler and duplex ultrasound were performed to evaluate the lower extremity deep venous systems from the level of the common femoral vein and including the common femoral, femoral, profunda femoral, popliteal and calf veins including the posterior tibial, peroneal and gastrocnemius veins when visible. The superficial great saphenous vein was also interrogated. Spectral Doppler was utilized to evaluate flow at rest and with distal augmentation maneuvers in the common femoral, femoral and popliteal veins. COMPARISON:  None Available. FINDINGS: RIGHT LOWER EXTREMITY Common Femoral Vein: No evidence of thrombus. Normal compressibility, respiratory phasicity and response to  augmentation. Saphenofemoral Junction: No evidence of thrombus. Normal compressibility and flow on color Doppler imaging. Profunda Femoral Vein: No evidence of thrombus. Normal compressibility and flow on color Doppler imaging. Femoral Vein: No evidence of thrombus. Normal compressibility, respiratory phasicity and response to augmentation. Popliteal Vein: No evidence of thrombus. Normal compressibility, respiratory phasicity and response to augmentation. Calf Veins: No evidence of thrombus. Normal compressibility and flow on color Doppler imaging. Superficial Great Saphenous Vein: No evidence of thrombus. Normal compressibility. Venous Reflux:  None. Other Findings:  Soft tissue edema. LEFT LOWER EXTREMITY Common Femoral Vein: No evidence of thrombus. Normal compressibility, respiratory phasicity and response to augmentation. Saphenofemoral Junction: No evidence of thrombus. Normal compressibility and flow on color Doppler imaging. Profunda Femoral Vein: No evidence of thrombus. Normal compressibility and flow on color Doppler imaging. Femoral Vein: No evidence of thrombus. Normal compressibility, respiratory phasicity and response to augmentation. Popliteal Vein: No evidence of thrombus. Normal compressibility, respiratory phasicity and response to augmentation. Calf Veins: No evidence of thrombus. Normal compressibility and flow on color Doppler imaging. Superficial Great Saphenous Vein: No evidence of thrombus. Normal compressibility. Venous Reflux:  None. Other Findings:  Soft tissue edema. IMPRESSION: No evidence of deep venous thrombosis in either lower extremity. Electronically Signed   By: Newell Eke M.D.   On: 02/21/2024 16:03   DG Chest 2 View Result Date: 02/21/2024 CLINICAL DATA:  Edema. EXAM: CHEST - 2 VIEW COMPARISON:  01/17/2024 and CT chest 11/29/2017. FINDINGS: Trachea is midline. Heart is at the upper limits of normal in size. Thoracic aorta is calcified. Small right pleural effusion with  right basilar atelectasis. Trace left pleural effusion.  Flowing anterior osteophytosis in the thoracic spine. IMPRESSION: 1. Small right pleural effusion with right basilar atelectasis. 2. Trace left pleural effusion. Electronically Signed   By: Newell Eke M.D.   On: 02/21/2024 15:42    There are no new results to review at this time.  Previous records (including but not limited to H&P, progress notes, nursing notes, TOC management) were reviewed in assessment of this patient.  Labs: CBC: Recent Labs  Lab 02/21/24 1444  WBC 4.9  NEUTROABS 2.7  HGB 12.8*  HCT 37.0*  MCV 93.0  PLT 143*   Basic Metabolic Panel: Recent Labs  Lab 02/21/24 1444 02/22/24 0301  NA 143 141  K 4.1 3.2*  CL 107 105  CO2 24 25  GLUCOSE 114* 106*  BUN 25* 24*  CREATININE 1.49* 1.65*  CALCIUM  9.2 8.6*   Liver Function Tests: No results for input(s): AST, ALT, ALKPHOS, BILITOT, PROT, ALBUMIN  in the last 168 hours. CBG: No results for input(s): GLUCAP in the last 168 hours.  Scheduled Meds:  amLODipine   10 mg Oral Daily   escitalopram   5 mg Oral Daily   furosemide   40 mg Intravenous BID   latanoprost   1 drop Both Eyes QHS   [START ON 02/23/2024] rosuvastatin   20 mg Oral Once per day on Monday Thursday   sodium chloride  flush  3 mL Intravenous Q12H   Continuous Infusions:  sodium chloride      PRN Meds:.sodium chloride , acetaminophen , melatonin, ondansetron  (ZOFRAN ) IV, sodium chloride  flush  Family Communication: Family at bedside  Disposition: Status is: Inpatient Remains inpatient appropriate because: CHF exacerbation     Time spent: 39 minutes  Length of inpatient stay: 1 days  Author: Carliss LELON Canales, DO 02/22/2024 10:59 AM  For on call review [email or web address].

## 2024-02-22 NOTE — Plan of Care (Signed)
  Problem: Education: Goal: Knowledge of General Education information will improve Description: Including pain rating scale, medication(s)/side effects and non-pharmacologic comfort measures Outcome: Progressing  Patient forgetful, family knowledgeable about plan of care.

## 2024-02-23 ENCOUNTER — Ambulatory Visit: Admitting: Physician Assistant

## 2024-02-23 ENCOUNTER — Inpatient Hospital Stay (HOSPITAL_COMMUNITY)

## 2024-02-23 ENCOUNTER — Other Ambulatory Visit (HOSPITAL_COMMUNITY)

## 2024-02-23 DIAGNOSIS — I5033 Acute on chronic diastolic (congestive) heart failure: Secondary | ICD-10-CM | POA: Diagnosis not present

## 2024-02-23 DIAGNOSIS — I350 Nonrheumatic aortic (valve) stenosis: Secondary | ICD-10-CM | POA: Diagnosis not present

## 2024-02-23 DIAGNOSIS — I4891 Unspecified atrial fibrillation: Secondary | ICD-10-CM | POA: Diagnosis not present

## 2024-02-23 DIAGNOSIS — I609 Nontraumatic subarachnoid hemorrhage, unspecified: Secondary | ICD-10-CM | POA: Diagnosis not present

## 2024-02-23 DIAGNOSIS — N179 Acute kidney failure, unspecified: Secondary | ICD-10-CM | POA: Diagnosis not present

## 2024-02-23 LAB — BASIC METABOLIC PANEL WITH GFR
Anion gap: 11 (ref 5–15)
BUN: 32 mg/dL — ABNORMAL HIGH (ref 8–23)
CO2: 27 mmol/L (ref 22–32)
Calcium: 8.7 mg/dL — ABNORMAL LOW (ref 8.9–10.3)
Chloride: 102 mmol/L (ref 98–111)
Creatinine, Ser: 2.05 mg/dL — ABNORMAL HIGH (ref 0.61–1.24)
GFR, Estimated: 31 mL/min — ABNORMAL LOW (ref >60)
Glucose, Bld: 105 mg/dL — ABNORMAL HIGH (ref 70–99)
Potassium: 3.5 mmol/L (ref 3.5–5.1)
Sodium: 140 mmol/L (ref 135–145)

## 2024-02-23 LAB — ECHOCARDIOGRAM LIMITED
AR max vel: 0.85 cm2
AV Area VTI: 0.89 cm2
AV Area mean vel: 0.95 cm2
AV Mean grad: 35.5 mmHg
AV Peak grad: 60.5 mmHg
Ao pk vel: 3.89 m/s
Height: 70 in
S' Lateral: 3.17 cm
Weight: 2532.64 [oz_av]

## 2024-02-23 LAB — CBC
HCT: 34.5 % — ABNORMAL LOW (ref 39.0–52.0)
Hemoglobin: 11.9 g/dL — ABNORMAL LOW (ref 13.0–17.0)
MCH: 31.3 pg (ref 26.0–34.0)
MCHC: 34.5 g/dL (ref 30.0–36.0)
MCV: 90.8 fL (ref 80.0–100.0)
Platelets: 160 K/uL (ref 150–400)
RBC: 3.8 MIL/uL — ABNORMAL LOW (ref 4.22–5.81)
RDW: 13.6 % (ref 11.5–15.5)
WBC: 4.9 K/uL (ref 4.0–10.5)
nRBC: 0 % (ref 0.0–0.2)

## 2024-02-23 LAB — MAGNESIUM: Magnesium: 1.8 mg/dL (ref 1.7–2.4)

## 2024-02-23 NOTE — Plan of Care (Signed)

## 2024-02-23 NOTE — Progress Notes (Addendum)
  Progress Note  Patient Name: Calvin Burnett Date of Encounter: 02/23/2024 [facility] Cardiologist: Redell Shallow, MD   Interval Summary   Patient reports feeling well this AM. No chest pain, shortness of breath, palpitations. No dizziness   Vital Signs Vitals:   02/22/24 2022 02/23/24 0023 02/23/24 0408 02/23/24 0721  BP: 128/84 108/67 124/72 (!) 147/82  Pulse: 82 (!) 40 (!) 43 (!) 47  Resp: 18 18 18 16   Temp: 98.3 F (36.8 C) 98.3 F (36.8 C) 98.1 F (36.7 C) 97.6 F (36.4 C)  TempSrc: Oral Oral Oral Oral  SpO2: 95% 90% 92% 94%  Weight:   71.8 kg   Height:        Intake/Output Summary (Last 24 hours) at 02/23/2024 0931 Last data filed at 02/23/2024 0827 Gross per 24 hour  Intake 480 ml  Output 2250 ml  Net -1770 ml      02/23/2024    4:08 AM 02/22/2024    4:36 AM 02/21/2024    2:03 PM  Last 3 Weights  Weight (lbs) 158 lb 4.6 oz 152 lb 1.9 oz 156 lb 1.4 oz  Weight (kg) 71.8 kg 69 kg 70.8 kg      Telemetry/ECG  Atrial fibrillation, HR in the 40s-50s - Personally Reviewed  Physical Exam  GEN: No acute distress.  Sitting upright in the bed  Neck: No JVD Cardiac: Irregular rate and rhythm, Grade 3/6 systolic murmur  Respiratory: Clear to auscultation bilaterally. Normal WOB on room air  GI: Soft, nontender, non-distended  MS: No edema in BLE   Assessment & Plan   Acute on Chronic Diastolic Heart Failure  - Most recent echocardiogram 01/2024 showed EF 50-55%, no regional wall motion abnormalities, mildly reduced RV systolic function  - Presented with bilateral lower extremity swelling. Pro-BNP elevated to 1026  - Treated with IV lasix  40 mg BID- put out 2.25 L urine yesterday and is currently net -4.75 L since admission  - Creatinine increased from 1.49>1.65 today. Euvolemic on exam  - Agree with holding lasix  today. Plan to start PO lasix  tomorrow if renal function allows   Moderate aortic valve stenosis  Mild-moderate mitral valve regurgitation  -  Echocardiogram in 01/2024 showed mild-moderate mitral valve regurgitation, moderate-severe AS  - Repeat limited echocardiogram pending this admission   Permanent Atrial Fibrillation  Bradycardia - Patient has recent admission with ICH with concerns of cerebral amyloid angiopathy. Eliquis  held per neurology. Being seen by EP for possible watchman as an outpatient. Needs neurology clearance prior to starting eliquis  back  - Per telemetry, patient's HR in the 40s overnight. Improved to the 50s since he has been awake. Appears to improve when he is awake and moving. Does not seem to be an indication for PPM at this time   Dementia      For questions or updates, please contact [facility] Please consult [email or web address] for contact info under       Signed, Rollo FABIENE Louder, PA-C   Personally seen and examined. Agree with above.  Continues to feel little bit better.  Heart rate with atrial fibrillation in the 40s and 50s with grade 3/6 murmur secondary to mitral valve stenosis.  Eliquis  currently being held secondary to concerns for intracranial hemorrhage cerebral amyloid angiopathy.  No pacemaker indications.  Creatinine has increased to 2.0.  IV Lasix  held.  Appears euvolemic.  Oneil Parchment, MD

## 2024-02-23 NOTE — Progress Notes (Signed)
 Progress Note   Patient: Calvin Burnett FMW:[id number] DOB: [birth year] DOA: 02/21/2024  DOS: the patient was seen and examined on 02/23/2024   Brief hospital course:  89 yo with hx T2DM, dementia, afib, HTN, colon cancer and other medical issues recently discharged 7/10 in setting of a syncopal episode. He was found to have SAH and his anticoagulation was discontinued. His beta blocker was discontinued in the setting of bradycardia. He saw EP 7/29 with consideration of watchman device. He presented today with bilateral lower extremity swelling.    Assessment and Plan:   Acute exacerbation of HFpEF - Etiology likely multifactorial.  Previous echo 7/6 noting EF 50-55%, severe BAE, moderate-severe AS.  Elevated proBNP.  Chest x-ray personally reviewed noting congestion bilaterally.  Also noted bradycardia with rates in the 40s-50s.  Appears to be responding well to initial diuresis, -4.7 L so far.  Cardiology consulted and following closely.  Appears to be intravascularly dry, increasing creatinine.  Will discontinue IV diuresis for now.  Monitor I's and O's, fluid restriction.  Follow electrolytes.   Hypokalemia - Exacerbated by diuresis.  Replenishment on board.  Will recheck BMP and magnesium  in AM.   Permanent atrial fibrillation with bradycardia - Rate 40s-50s on presentation.  Possibly exacerbated by beta-blocker usage which patient is unclear if he is taking or not.  No current dizziness, blood pressure stable.  Continue holding beta-blockers for now.  Rate stable and blood pressure appears adequate.  Possible cardiology may not pursue pacemaker placement while inpatient.  Anticoagulation as below.  Patient is also scheduled in the outpatient setting for Watchman procedure.   Moderate-severe aortic stenosis - Likely contributing to patient's CHF exacerbation and lower extremity edema.  Unlikely to pursue invasive therapy during inpatient hospitalization.   History of subarachnoid  hemorrhage - CT head noting resolving hemorrhage.  Based on age and creatinine clearance, Eliquis  dose 2.5 mg twice daily.  Currently on hold.  Patient is also scheduled in the outpatient setting for Watchman procedure.   Acute kidney injury on CKD 3B - Creatinine minimally elevated above baseline.  Likely intravascularly dry.  Holding anticoagulation at this time.  Recheck BMP in AM.   Diabetes mellitus - Insulin  sliding scale on board.   Hypertension/hyperlipidemia - Holding on beta-blockers.  Resume other cardiac regimen.   GERD - PPI on board   Subjective: Patient resting comfortably this morning.  Pink used however took some time to discuss with the patient about why he is in the hospital and that his daughter and son are up-to-date and know he is in the hospital.  He currently denies any dizziness, shortness of breath, fever, chills, chest pain, nausea, vomiting, abdominal pain.  Physical Exam:  Vitals:   02/22/24 2022 02/23/24 0023 02/23/24 0408 02/23/24 0721  BP: 128/84 108/67 124/72 (!) 147/82  Pulse: 82 (!) 40 (!) 43 (!) 47  Resp: 18 18 18 16   Temp: 98.3 F (36.8 C) 98.3 F (36.8 C) 98.1 F (36.7 C) 97.6 F (36.4 C)  TempSrc: Oral Oral Oral Oral  SpO2: 95% 90% 92% 94%  Weight:   71.8 kg   Height:        GENERAL:  Alert, pleasant, no acute distress  HEENT:  EOMI CARDIOVASCULAR: Irregularly irregular, systolic murmur RESPIRATORY:  Clear to auscultation, no wheezing, rales, or rhonchi GASTROINTESTINAL:  Soft, nontender, nondistended EXTREMITIES: Trace BL LE pitting edema NEURO:  No new focal deficits appreciated SKIN:  No rashes noted PSYCH:  Appropriate mood and affect  Data Reviewed:  Imaging Studies: CT Head Wo Contrast Result Date: 02/21/2024 CLINICAL DATA:  Headache, increasing frequency or severity EXAM: CT HEAD WITHOUT CONTRAST TECHNIQUE: Contiguous axial images were obtained from the base of the skull through the vertex without intravenous contrast.  RADIATION DOSE REDUCTION: This exam was performed according to the departmental dose-optimization program which includes automated exposure control, adjustment of the mA and/or kV according to patient size and/or use of iterative reconstruction technique. COMPARISON:  Brain MRI and head CT a 01/18/2024 FINDINGS: Brain: Subcentimeter hyperdense foci in the left occipital lobe persists but slightly diminished from prior CT, series 2, images 22 and 25. No evidence of new hemorrhage. Stable degree of atrophy and chronic small vessel ischemia. Chronic right MCA infarct, unchanged in appearance. There is no subdural collection. No midline shift. Vascular: Atherosclerosis of skullbase vasculature without hyperdense vessel or abnormal calcification. Skull: No fracture or focal lesion. Sinuses/Orbits: No acute findings. Again seen partial opacification of right ethmoid air cells. No mastoid effusion. Bilateral cataract resection. Other: None. IMPRESSION: 1. Subcentimeter hyperdense foci in the left occipital lobe persist but slightly diminished from prior CT, likely resolving hemorrhage. No evidence of new hemorrhage or other acute findings. 2. Stable atrophy, chronic small vessel ischemia, and chronic right MCA infarct. Electronically Signed   By: Andrea Gasman M.D.   On: 02/21/2024 16:14   US  Venous Img Lower Bilateral Result Date: 02/21/2024 CLINICAL DATA:  Bilateral lower leg swelling for 3 days. EXAM: BILATERAL LOWER EXTREMITY VENOUS DOPPLER ULTRASOUND TECHNIQUE: Gray-scale sonography with graded compression, as well as color Doppler and duplex ultrasound were performed to evaluate the lower extremity deep venous systems from the level of the common femoral vein and including the common femoral, femoral, profunda femoral, popliteal and calf veins including the posterior tibial, peroneal and gastrocnemius veins when visible. The superficial great saphenous vein was also interrogated. Spectral Doppler was utilized to  evaluate flow at rest and with distal augmentation maneuvers in the common femoral, femoral and popliteal veins. COMPARISON:  None Available. FINDINGS: RIGHT LOWER EXTREMITY Common Femoral Vein: No evidence of thrombus. Normal compressibility, respiratory phasicity and response to augmentation. Saphenofemoral Junction: No evidence of thrombus. Normal compressibility and flow on color Doppler imaging. Profunda Femoral Vein: No evidence of thrombus. Normal compressibility and flow on color Doppler imaging. Femoral Vein: No evidence of thrombus. Normal compressibility, respiratory phasicity and response to augmentation. Popliteal Vein: No evidence of thrombus. Normal compressibility, respiratory phasicity and response to augmentation. Calf Veins: No evidence of thrombus. Normal compressibility and flow on color Doppler imaging. Superficial Great Saphenous Vein: No evidence of thrombus. Normal compressibility. Venous Reflux:  None. Other Findings:  Soft tissue edema. LEFT LOWER EXTREMITY Common Femoral Vein: No evidence of thrombus. Normal compressibility, respiratory phasicity and response to augmentation. Saphenofemoral Junction: No evidence of thrombus. Normal compressibility and flow on color Doppler imaging. Profunda Femoral Vein: No evidence of thrombus. Normal compressibility and flow on color Doppler imaging. Femoral Vein: No evidence of thrombus. Normal compressibility, respiratory phasicity and response to augmentation. Popliteal Vein: No evidence of thrombus. Normal compressibility, respiratory phasicity and response to augmentation. Calf Veins: No evidence of thrombus. Normal compressibility and flow on color Doppler imaging. Superficial Great Saphenous Vein: No evidence of thrombus. Normal compressibility. Venous Reflux:  None. Other Findings:  Soft tissue edema. IMPRESSION: No evidence of deep venous thrombosis in either lower extremity. Electronically Signed   By: Newell Eke M.D.   On: 02/21/2024  16:03   DG Chest 2 View Result  Date: 02/21/2024 CLINICAL DATA:  Edema. EXAM: CHEST - 2 VIEW COMPARISON:  01/17/2024 and CT chest 11/29/2017. FINDINGS: Trachea is midline. Heart is at the upper limits of normal in size. Thoracic aorta is calcified. Small right pleural effusion with right basilar atelectasis. Trace left pleural effusion. Flowing anterior osteophytosis in the thoracic spine. IMPRESSION: 1. Small right pleural effusion with right basilar atelectasis. 2. Trace left pleural effusion. Electronically Signed   By: Newell Eke M.D.   On: 02/21/2024 15:42    There are no new results to review at this time.  Previous records (including but not limited to H&P, progress notes, nursing notes, TOC management) were reviewed in assessment of this patient.  Labs: CBC: Recent Labs  Lab 02/21/24 1444 02/23/24 0324  WBC 4.9 4.9  NEUTROABS 2.7  --   HGB 12.8* 11.9*  HCT 37.0* 34.5*  MCV 93.0 90.8  PLT 143* 160   Basic Metabolic Panel: Recent Labs  Lab 02/21/24 1444 02/22/24 0301 02/23/24 0324  NA 143 141 140  K 4.1 3.2* 3.5  CL 107 105 102  CO2 24 25 27   GLUCOSE 114* 106* 105*  BUN 25* 24* 32*  CREATININE 1.49* 1.65* 2.05*  CALCIUM  9.2 8.6* 8.7*  MG  --   --  1.8   Liver Function Tests: Recent Labs  Lab 02/22/24 1012  AST 23  ALT 17  ALKPHOS 81  BILITOT 1.0  PROT 6.5  ALBUMIN  3.1*   CBG: No results for input(s): GLUCAP in the last 168 hours.  Scheduled Meds:  amLODipine   10 mg Oral Daily   escitalopram   5 mg Oral Daily   latanoprost   1 drop Both Eyes QHS   potassium chloride   40 mEq Oral BID   rosuvastatin   20 mg Oral Once per day on Monday Thursday   sodium chloride  flush  3 mL Intravenous Q12H   Continuous Infusions: PRN Meds:.acetaminophen , melatonin, ondansetron  (ZOFRAN ) IV, sodium chloride  flush  Family Communication: None at bedside  Disposition: Status is: Inpatient Remains inpatient appropriate because: HFpEF, bradycardia A-fib     Time  spent: 35 minutes  Length of inpatient stay: 2 days  Author: Carliss LELON Canales, DO 02/23/2024 10:42 AM  For on call review [email or web address].

## 2024-02-23 NOTE — Progress Notes (Signed)
   02/23/24 1048  Spiritual Encounters  Type of Visit Initial  Care provided to: Patient  Referral source Clinical staff  Reason for visit Advance directives  OnCall Visit No  Spiritual Framework  Presenting Themes Impactful experiences and emotions  Community/Connection Family  Patient Stress Factors Health changes  Family Stress Factors None identified  Interventions  Spiritual Care Interventions Made Compassionate presence;Established relationship of care and support  Intervention Outcomes  Outcomes Awareness of health;Awareness of support  Mental Health Advance Directives  Would patient like information on creating a mental health advance directive? No - Patient declined   Chaplain met with the Pt regarding Advance Directives (A.D.) Pt stated that his daughter [name] Gavel [phone]  would be designated to make healthcare decision on his behalf as confirmed during our conversation with Chaplain. No AD was completed at this time.

## 2024-02-23 NOTE — Progress Notes (Signed)
 Heart Failure Navigator Progress Note  Assessed for Heart & Vascular TOC clinic readiness.  Patient does not meet criteria due to per MD note patient with history of Dementia. Has a scheduled [facility] appointment on 03/30/2024. No HF TOC. .   Navigator will sign off at this time.   Stephane Haddock, BSN, Scientist, clinical (histocompatibility and immunogenetics) Only

## 2024-02-23 NOTE — Plan of Care (Signed)

## 2024-02-24 DIAGNOSIS — I5033 Acute on chronic diastolic (congestive) heart failure: Secondary | ICD-10-CM | POA: Diagnosis not present

## 2024-02-24 DIAGNOSIS — N179 Acute kidney failure, unspecified: Secondary | ICD-10-CM | POA: Diagnosis not present

## 2024-02-24 DIAGNOSIS — I340 Nonrheumatic mitral (valve) insufficiency: Secondary | ICD-10-CM

## 2024-02-24 DIAGNOSIS — I4891 Unspecified atrial fibrillation: Secondary | ICD-10-CM | POA: Diagnosis not present

## 2024-02-24 DIAGNOSIS — K219 Gastro-esophageal reflux disease without esophagitis: Secondary | ICD-10-CM | POA: Diagnosis not present

## 2024-02-24 LAB — BASIC METABOLIC PANEL WITH GFR
Anion gap: 9 (ref 5–15)
BUN: 29 mg/dL — ABNORMAL HIGH (ref 8–23)
CO2: 25 mmol/L (ref 22–32)
Calcium: 8.7 mg/dL — ABNORMAL LOW (ref 8.9–10.3)
Chloride: 105 mmol/L (ref 98–111)
Creatinine, Ser: 1.6 mg/dL — ABNORMAL HIGH (ref 0.61–1.24)
GFR, Estimated: 41 mL/min — ABNORMAL LOW
Glucose, Bld: 101 mg/dL — ABNORMAL HIGH (ref 70–99)
Potassium: 4.5 mmol/L (ref 3.5–5.1)
Sodium: 139 mmol/L (ref 135–145)

## 2024-02-24 LAB — CBC
HCT: 35.9 % — ABNORMAL LOW (ref 39.0–52.0)
Hemoglobin: 12.5 g/dL — ABNORMAL LOW (ref 13.0–17.0)
MCH: 32.2 pg (ref 26.0–34.0)
MCHC: 34.8 g/dL (ref 30.0–36.0)
MCV: 92.5 fL (ref 80.0–100.0)
Platelets: 152 K/uL (ref 150–400)
RBC: 3.88 MIL/uL — ABNORMAL LOW (ref 4.22–5.81)
RDW: 13.7 % (ref 11.5–15.5)
WBC: 5.1 K/uL (ref 4.0–10.5)
nRBC: 0 % (ref 0.0–0.2)

## 2024-02-24 LAB — MAGNESIUM: Magnesium: 2 mg/dL (ref 1.7–2.4)

## 2024-02-24 MED ORDER — FUROSEMIDE 20 MG PO TABS: 20.0000 mg | ORAL_TABLET | Freq: Every day | ORAL | 0 refills | Status: DC

## 2024-02-24 MED ORDER — HYDRALAZINE HCL 20 MG/ML IJ SOLN: 10.0000 mg | Freq: Four times a day (QID) | INTRAMUSCULAR | Status: DC | PRN

## 2024-02-24 NOTE — TOC Transition Note (Addendum)
 Transition of Care [facility]) - Discharge Note   Patient Details  Name: Calvin Burnett MRN: [id number] Date of Birth: 02/06/1934  Transition of Care [facility]) CM/SW Contact:  Waddell Barnie Rama, RN Phone Number: 02/24/2024, 12:31 PM   Clinical Narrative:    For dc today, patient states that he would rather have [facility] services then outpatient PT.  NCM awaiting to hear from [place] with [facility] to see if he is active per the patient 's daughter.  Patient is active with [facility], HHPT, HHOT, Child psychotherapist and would like to continue.           Patient Goals and CMS Choice            Discharge Placement                       Discharge Plan and Services Additional resources added to the After Visit Summary for                                       Social Drivers of Health (SDOH) Interventions SDOH Screenings   Food Insecurity: No Food Insecurity (02/21/2024)  Housing: Unknown (02/21/2024)  Transportation Needs: No Transportation Needs (02/21/2024)  Utilities: Not At Risk (02/21/2024)  Depression (PHQ2-9): Low Risk  (11/12/2023)  Social Connections: Unknown (02/21/2024)  Recent Concern: Social Connections - Socially Isolated (01/19/2024)  Tobacco Use: Medium Risk (02/21/2024)     Readmission Risk Interventions    02/24/2024   12:13 PM  Readmission Risk Prevention Plan  Transportation Screening Complete  PCP or Specialist Appt within 5-7 Days Complete  Home Care Screening Complete  Medication Review (RN CM) Complete

## 2024-02-24 NOTE — Evaluation (Signed)
 Physical Therapy Evaluation Patient Details Name: Calvin Burnett MRN: [id number] DOB: 10-21-1933 Today's Date: 02/24/2024  History of Present Illness  Pt is an 89 y.o. male presenting 8/9 with BLE edema. Found to be in afib with mild RVR, elevated proBNP, anemia, AKI, thrombocytopenia. Admitted with fluid overload. PMH: DMII, dementia, afib, HTN, colon cancer, prostate cancer, GERD, CKD IIIb, SAH, recent syncopal episode with discharge 7/10.   Clinical Impression  Pt in bed upon arrival of PT, agreeable to evaluation at this time. Prior to admission the pt was independent with mobility, reports living with his daughter and presence of HH aide while daughter is at work. The pt demos good stability with use of RW, but was also able to complete hallway ambulation without UE support. He does demo significant instability without UE support, especially with balance challenge. Pt encouraged to ambulate with RW for increased support, and will benefit from continued skilled PT as well as post-acute OPPT to progress dynamic stability and reduce risk of falls.    Dynamic Gait Index (DGI): 10/24 (<19 indicates increased risk for falls)     If plan is discharge home, recommend the following: A little help with walking and/or transfers;A little help with bathing/dressing/bathroom;Assistance with cooking/housework;Direct supervision/assist for medications management;Direct supervision/assist for financial management;Assist for transportation;Help with stairs or ramp for entrance;Supervision due to cognitive status   Can travel by private vehicle        Equipment Recommendations Rolling walker (2 wheels)  Recommendations for Other Services       Functional Status Assessment Patient has had a recent decline in their functional status and demonstrates the ability to make significant improvements in function in a reasonable and predictable amount of time.     Precautions / Restrictions  Precautions Precautions: Fall Recall of Precautions/Restrictions: Impaired Restrictions Weight Bearing Restrictions Per Provider Order: No      Mobility  Bed Mobility Overal bed mobility: Needs Assistance             General bed mobility comments: pt ambulating with OT at start of session and sitting EOB at end of session    Transfers Overall transfer level: Needs assistance Equipment used: None Transfers: Sit to/from Stand Sit to Stand: Contact guard assist           General transfer comment: completed x5 sit-stand without UE    Ambulation/Gait Ambulation/Gait assistance: Contact guard assist, Min assist Gait Distance (Feet): 200 Feet Assistive device: Rolling walker (2 wheels), None Gait Pattern/deviations: Step-through pattern, Decreased stride length, Staggering right, Narrow base of support Gait velocity: decreased Gait velocity interpretation: <1.31 ft/sec, indicative of household ambulator   General Gait Details: pt with single LOB when turning and taking increased steps to regain balance. otherwise mild instability with challenge but no overt LOB     Balance Overall balance assessment: Needs assistance Sitting-balance support: No upper extremity supported, Feet supported Sitting balance-Leahy Scale: Good     Standing balance support: No upper extremity supported, Bilateral upper extremity supported, During functional activity Standing balance-Leahy Scale: Poor Standing balance comment: benefits from AD dynamically                 Standardized Balance Assessment Standardized Balance Assessment : Dynamic Gait Index   Dynamic Gait Index Level Surface: Mild Impairment Change in Gait Speed: Moderate Impairment Gait with Horizontal Head Turns: Moderate Impairment Gait with Vertical Head Turns: Moderate Impairment Gait and Pivot Turn: Severe Impairment Step Over Obstacle: Mild Impairment Step Around Obstacles: Mild Impairment Steps:  Moderate  Impairment Total Score: 10       Pertinent Vitals/Pain Pain Assessment Pain Assessment: No/denies pain    Home Living Family/patient expects to be discharged to:: Private residence Living Arrangements: Children Available Help at Discharge: Family;Available PRN/intermittently Type of Home: House Home Access: Stairs to enter Entrance Stairs-Rails: None Entrance Stairs-Number of Steps: 2   Home Layout: One level Home Equipment: Agricultural consultant (2 wheels);BSC/3in1 Additional Comments: Pt is a poor historian. History taken from prior chart review as well as pt report;    Prior Function Prior Level of Function : Independent/Modified Independent             Mobility Comments: ambulatory without AD per pt report and chart review ADLs Comments: Indep with ADLs. Manages his own medications.     Extremity/Trunk Assessment   Upper Extremity Assessment Upper Extremity Assessment: Defer to OT evaluation    Lower Extremity Assessment Lower Extremity Assessment: Generalized weakness (no focal weakness, grossly 4/5 to MMT reports no difference in sensation)    Cervical / Trunk Assessment Cervical / Trunk Assessment: Normal  Communication   Communication Communication: No apparent difficulties    Cognition Arousal: Alert Behavior During Therapy: WFL for tasks assessed/performed   PT - Cognitive impairments: No family/caregiver present to determine baseline                       PT - Cognition Comments: not formally assessed, pt following commands well in session, minimal verbalizations Following commands: Impaired Following commands impaired: Follows one step commands with increased time, Follows multi-step commands inconsistently     Cueing Cueing Techniques: Verbal cues, Gestural cues     General Comments General comments (skin integrity, edema, etc.): VSS    Exercises     Assessment/Plan    PT Assessment Patient needs continued PT services  PT Problem  List Decreased strength;Decreased activity tolerance;Decreased balance;Decreased mobility       PT Treatment Interventions DME instruction;Stair training;Gait training;Functional mobility training;Therapeutic activities;Therapeutic exercise;Balance training;Patient/family education    PT Goals (Current goals can be found in the Care Plan section)  Acute Rehab PT Goals Patient Stated Goal: return home PT Goal Formulation: With patient Time For Goal Achievement: 03/09/24 Potential to Achieve Goals: Good    Frequency Min 2X/week        AM-PAC PT 6 Clicks Mobility  Outcome Measure Help needed turning from your back to your side while in a flat bed without using bedrails?: None Help needed moving from lying on your back to sitting on the side of a flat bed without using bedrails?: None Help needed moving to and from a bed to a chair (including a wheelchair)?: A Little Help needed standing up from a chair using your arms (e.g., wheelchair or bedside chair)?: A Little Help needed to walk in hospital room?: A Little Help needed climbing 3-5 steps with a railing? : A Little 6 Click Score: 20    End of Session Equipment Utilized During Treatment: Gait belt Activity Tolerance: Patient tolerated treatment well Patient left: in bed;with call bell/phone within reach;with bed alarm set (sitting EOB) Nurse Communication: Mobility status PT Visit Diagnosis: Unsteadiness on feet (R26.81);Other abnormalities of gait and mobility (R26.89);Muscle weakness (generalized) (M62.81)    Time: 9092-9073 PT Time Calculation (min) (ACUTE ONLY): 19 min   Charges:   PT Evaluation $PT Eval Low Complexity: 1 Low   PT General Charges $$ ACUTE PT VISIT: 1 Visit  Izetta Call, PT, DPT   Acute Rehabilitation Department Office [phone] Secure Chat Communication Preferred  Izetta JULIANNA Call 02/24/2024, 12:13 PM

## 2024-02-24 NOTE — Plan of Care (Signed)

## 2024-02-24 NOTE — Evaluation (Signed)
 Occupational Therapy Evaluation Patient Details Name: Calvin Burnett MRN: [id number] DOB: [birth year] Today's Date: 02/24/2024   History of Present Illness   Pt is an 89 y.o. male presenting with BLE edema. Found to be in afib with mild RVR, elevated proBNP, anemia, AKI, thrombocytopenia. Admitted with fluid overload. PMH: DMII, dementia, afib, HTN, colon cancer, prostate cancer, GERD, CKD IIIb, SAH, recent syncopal episode with discharge 7/10     Clinical Impressions PTA, pt recently back home from hospital and reports he is typically independent in ADL and IADL; per phone conversation with daughter pt has PCA while she is at work since prior admission as well as pt does not drive any longer. During eval, pt observed with decreased functional vision with prescription lenses donned, general weakness, and decreased cognition consistent with dx of dementia. Pt daughter unable to confirm or deny whether over/undershooting is baseline. Due to questionable change in motor and visual skills, recommend initial OP OT evaluation. Additionally encouraged daughter to provide at least supervision with medication management.      If plan is discharge home, recommend the following:   A little help with walking and/or transfers;A little help with bathing/dressing/bathroom;Assistance with cooking/housework;Help with stairs or ramp for entrance;Assist for transportation;Direct supervision/assist for medications management;Direct supervision/assist for financial management     Functional Status Assessment   Patient has had a recent decline in their functional status and demonstrates the ability to make significant improvements in function in a reasonable and predictable amount of time.     Equipment Recommendations   None recommended by OT     Recommendations for Other Services         Precautions/Restrictions   Precautions Precautions: Fall Recall of Precautions/Restrictions:  Impaired Restrictions Weight Bearing Restrictions Per Provider Order: No     Mobility Bed Mobility Overal bed mobility: Needs Assistance Bed Mobility: Supine to Sit     Supine to sit: Contact guard     General bed mobility comments: increased time for safety    Transfers Overall transfer level: Needs assistance   Transfers: Sit to/from Stand Sit to Stand: Contact guard assist, Min assist           General transfer comment: Min A on initial rise, progressing to CGA on additional attempt      Balance Overall balance assessment: Needs assistance Sitting-balance support: No upper extremity supported, Feet supported Sitting balance-Leahy Scale: Good     Standing balance support: No upper extremity supported, Bilateral upper extremity supported, During functional activity Standing balance-Leahy Scale: Poor Standing balance comment: benefits from AD dynamically                           ADL either performed or assessed with clinical judgement   ADL Overall ADL's : Needs assistance/impaired Eating/Feeding: Set up;Bed level;Sitting   Grooming: Contact guard assist;Standing   Upper Body Bathing: Set up;Sitting   Lower Body Bathing: Contact guard assist;Sit to/from stand   Upper Body Dressing : Set up;Sitting   Lower Body Dressing: Contact guard assist;Sit to/from stand   Toilet Transfer: Contact guard assist;Ambulation;Rolling walker (2 wheels) Toilet Transfer Details (indicate cue type and reason): vs none         Functional mobility during ADLs: Contact guard assist;Rolling walker (2 wheels)       Vision Baseline Vision/History: 1 Wears glasses Ability to See in Adequate Light: 0 Adequate Patient Visual Report: No change from baseline Vision Assessment?: Vision impaired- to be further  tested in functional context Additional Comments: undershooting with oral care tasks (applying toothpaste to brush)     Perception         Praxis          Pertinent Vitals/Pain Pain Assessment Pain Assessment: No/denies pain     Extremity/Trunk Assessment Upper Extremity Assessment Upper Extremity Assessment: Generalized weakness   Lower Extremity Assessment Lower Extremity Assessment: Defer to PT evaluation   Cervical / Trunk Assessment Cervical / Trunk Assessment: Normal   Communication Communication Communication: No apparent difficulties   Cognition Arousal: Alert Behavior During Therapy: WFL for tasks assessed/performed Cognition: History of cognitive impairments, No family/caregiver present to determine baseline, Cognition impaired   Orientation impairments: Place, Situation Awareness: Intellectual awareness intact, Intellectual awareness impaired, Online awareness impaired Memory impairment (select all impairments): Short-term memory Attention impairment (select first level of impairment): Sustained attention Executive functioning impairment (select all impairments): Organization, Problem solving OT - Cognition Comments: poor insight into deficits at times, decreased memory. Follows commands with increased time                 Following commands: Impaired Following commands impaired: Follows one step commands with increased time, Follows multi-step commands inconsistently     Cueing  General Comments   Cueing Techniques: Verbal cues;Gestural cues  HR 58 on arrival and 75-82 during mobility   Exercises     Shoulder Instructions      Home Living Family/patient expects to be discharged to:: Private residence Living Arrangements: Children Available Help at Discharge: Family;Available PRN/intermittently Type of Home: House Home Access: Stairs to enter [employer] of Steps: 2 Entrance Stairs-Rails: None Home Layout: One level     Bathroom Shower/Tub: Producer, television/film/video: Standard     Home Equipment: Agricultural consultant (2 wheels);BSC/3in1   Additional Comments: Pt is a poor  historian. History taken from prior chart review as well as pt report;      Prior Functioning/Environment Prior Level of Function : Independent/Modified Independent             Mobility Comments: ambulatory without AD per pt report and chart review ADLs Comments: Indep with ADLs. Manages his own medications.    OT Problem List: Decreased strength;Decreased activity tolerance;Impaired balance (sitting and/or standing);Decreased cognition;Decreased coordination;Decreased safety awareness;Decreased knowledge of use of DME or AE   OT Treatment/Interventions: Self-care/ADL training;Therapeutic exercise;DME and/or AE instruction;Therapeutic activities;Patient/family education;Balance training      OT Goals(Current goals can be found in the care plan section)   Acute Rehab OT Goals Patient Stated Goal: get better OT Goal Formulation: With patient Time For Goal Achievement: 03/09/24 Potential to Achieve Goals: Good   OT Frequency:  Min 2X/week    Co-evaluation              AM-PAC OT 6 Clicks Daily Activity     Outcome Measure Help from another person eating meals?: A Little Help from another person taking care of personal grooming?: A Little Help from another person toileting, which includes using toliet, bedpan, or urinal?: A Little Help from another person bathing (including washing, rinsing, drying)?: A Little Help from another person to put on and taking off regular upper body clothing?: A Little Help from another person to put on and taking off regular lower body clothing?: A Little 6 Click Score: 18   End of Session Equipment Utilized During Treatment: Gait belt;Rolling walker (2 wheels) Nurse Communication: Mobility status  Activity Tolerance: Patient tolerated treatment well Patient left: Other (comment) (  in hall with PT)  OT Visit Diagnosis: Unsteadiness on feet (R26.81);Muscle weakness (generalized) (M62.81);Other symptoms and signs involving cognitive  function                Time: 9157-9096 OT Time Calculation (min): 21 min Charges:  OT General Charges $OT Visit: 1 Visit OT Evaluation $OT Eval Low Complexity: 1 Low  Elma JONETTA Lebron FREDERICK, OTR/L [facility] Acute Rehabilitation Office: [phone]   Elma JONETTA Lebron 02/24/2024, 9:44 AM

## 2024-02-24 NOTE — TOC CM/SW Note (Signed)
 Transition of Care [facility]) - Inpatient Brief Assessment   Patient Details  Name: Calvin Burnett MRN: [id number] Date of Birth: 26-Nov-1933  [facility]) CM/SW Contact:    Waddell Barnie Rama, RN Phone Number: 02/24/2024, 12:15 PM   Clinical Narrative: Per patient states NCM can speak with daughter, From home with daughter, has PCP and insurance on file, states has [facility] services in place at this time and also has an aide,  has a walker at home.  States family member (daughter)  will transport them home at [employer] when she gets off work at 2 pm and family is support system, states gets medications from [facility] on [address].    Pta self ambulatory.  He already has an apt to see his PCP on 8/27.        Transition of Care Asessment: Insurance and Status: Insurance coverage has been reviewed Patient has primary care physician: Yes Home environment has been reviewed: home withe daughter Prior level of function:: indep Prior/Current Home Services: Current home services (walker) Social Drivers of Health Review: SDOH reviewed no interventions necessary Readmission risk has been reviewed: Yes Transition of care needs: transition of care needs identified, TOC will continue to follow

## 2024-02-24 NOTE — Discharge Summary (Signed)
 Physician Discharge Summary   Patient: Calvin Burnett MRN: [id number] DOB: September 29, 1933  Admit date:     02/21/2024  Discharge date: 02/24/24  Discharge Physician: Calvin Burnett   PCP: Calvin Garnette KIDD, MD   Recommendations at discharge:    Pt to be discharged home with outpatient OT.   If you experience worsening fever, chills, chest pain, shortness of breath, or other concerning symptoms, please call your PCP or go to the emergency department immediately.  Discharge Diagnoses: Principal Problem:   Acute on chronic heart failure with preserved ejection fraction (HFpEF) ([name]) Active Problems:   SAH (subarachnoid hemorrhage) ([name])   Atrial fibrillation with slow ventricular response ([name])   Essential hypertension   GERD (gastroesophageal reflux disease)   Type 2 diabetes mellitus ([name])   Hyperlipidemia associated with type 2 diabetes mellitus ([name])   Acute kidney injury superimposed on chronic kidney disease ([name])   Major depressive disorder with single episode, in full remission ([name])  Resolved Problems:   * No resolved hospital problems. *   Hospital Course:  89 yo with hx T2DM, dementia, afib, HTN, colon cancer and other medical issues recently discharged 7/10 in setting of a syncopal episode. He was found to have SAH and his anticoagulation was discontinued. His beta blocker was discontinued in the setting of bradycardia. He saw EP 7/29 with consideration of watchman device. He presented today with bilateral lower extremity swelling.    Assessment and Plan:   Acute exacerbation of HFpEF - Etiology likely multifactorial.  Previous echo 7/6 noting EF 50-55%, severe BAE, moderate-severe AS.  Elevated proBNP.  Chest x-ray personally reviewed noting congestion bilaterally.  Also noted bradycardia with rates in the 40s-50s.  Appears to be responding well to initial diuresis, -4.7 L so far.  Cardiology consulted and following closely.  Appears to be intravascularly dry, increasing  creatinine.  Will discontinue IV diuresis for now.  Transition to p.o. Lasix  20 mg daily.  Recommend close follow-up with cardiology in the outpatient setting.    Hypokalemia - Exacerbated by diuresis.  Resolved after replenishment.  Permanent atrial fibrillation with bradycardia - Rate 40s-50s on presentation.  Possibly exacerbated by beta-blocker usage which patient is unclear if he is taking or not.  No current dizziness, blood pressure stable.  Continue holding beta-blockers for now.  Rate stable in the 50s-60s now, blood pressure appears adequate.  No urgent need to pursue pacemaker placement while inpatient.  Anticoagulation as below.  Patient is also scheduled in the outpatient setting for Watchman procedure.  Close follow-up with cardiology in the outpatient setting.   Moderate-severe aortic stenosis - Likely contributing to patient's CHF exacerbation and lower extremity edema.  Unlikely to pursue invasive therapy during inpatient hospitalization.  May eventually warrant TAVR per cardiology follow-up.   History of subarachnoid hemorrhage - CT head noting resolving hemorrhage.  Based on age and creatinine clearance, Eliquis  dose 2.5 mg twice daily.  Currently on hold.  Patient is also scheduled in the outpatient setting for Watchman procedure.   Acute kidney injury on CKD 3B - Likely intravascularly dry.  Showed improvement after holding/stopping IV diuresis.     Diabetes mellitus - Resume home medication regimen   Hypertension/hyperlipidemia - Discontinue any beta-blockers.  Resume other cardiac regimen.   GERD - PPI on board   Consultants: Cardiology Procedures performed: None Disposition: Home Diet recommendation:  Discharge Diet Orders (From admission, onward)     Start     Ordered   02/24/24 0000  Diet -  low sodium heart healthy        02/24/24 1207           Cardiac and Carb modified diet  DISCHARGE MEDICATION: Allergies as of 02/24/2024       Reactions    Kcentra  [prothrombin  Complex Conc Human] Anaphylaxis, Shortness Of Breath, Other (See Comments)   Patient immediately became short of breath and bright red. Started improving minutes after infusion stopped. Had seizure-like activity, also        Medication List     TAKE these medications    amLODipine  10 MG tablet Commonly known as: NORVASC  Take 1 tablet (10 mg total) by mouth daily. What changed: when to take this   escitalopram  5 MG tablet Commonly known as: Lexapro  Take 1 tablet (5 mg total) by mouth daily. What changed: when to take this   furosemide  20 MG tablet Commonly known as: Lasix  Take 1 tablet (20 mg total) by mouth daily.   latanoprost  0.005 % ophthalmic solution Commonly known as: XALATAN  Place 1 drop into both eyes at bedtime.   melatonin 3 MG Tabs tablet Take 2 tablets (6 mg total) by mouth at bedtime as needed. What changed:  how much to take when to take this   metFORMIN  500 MG 24 hr tablet Commonly known as: GLUCOPHAGE -XR TAKE 1 TABLET BY MOUTH EVERY DAY WITH BREAKFAST What changed: See the new instructions.   rosuvastatin  20 MG tablet Commonly known as: CRESTOR  Take 1 tablet (20 mg total) by mouth 2 (two) times a week.   TYLENOL  PO Take 1-2 tablets by mouth as needed.         Discharge Exam: Filed Weights   02/22/24 0436 02/23/24 0408 02/24/24 0500  Weight: 69 kg 71.8 kg 69.6 kg    GENERAL:  Alert, pleasant, no acute distress  HEENT:  EOMI CARDIOVASCULAR: Irregularly irregular, systolic murmur RESPIRATORY:  Clear to auscultation, no wheezing, rales, or rhonchi GASTROINTESTINAL:  Soft, nontender, nondistended EXTREMITIES: Trace BL LE pitting edema NEURO:  No new focal deficits appreciated SKIN:  No rashes noted PSYCH:  Appropriate mood and affect    Condition at discharge: improving  The results of significant diagnostics from this hospitalization (including imaging, microbiology, ancillary and laboratory) are listed below for  reference.   Imaging Studies: ECHOCARDIOGRAM LIMITED Result Date: 02/23/2024    ECHOCARDIOGRAM LIMITED REPORT   Patient Name:   Calvin Burnett Date of Exam: 02/23/2024 Medical Rec #:  [id number]       Height:       70.0 in Accession #:    [id number]      Weight:       158.3 lb Date of Birth:  04/28/1934       BSA:          1.890 m Patient Age:    89 years        BP:           122/68 mmHg Patient Gender: M               HR:           56 bpm. Exam Location:  Inpatient Procedure: Limited Echo, Cardiac Doppler and Color Doppler (Both Spectral and            Color Flow Doppler were utilized during procedure). Indications:    Aortic stenosis I35.0  History:        Patient has prior history of Echocardiogram examinations, most  recent 01/18/2024. Aortic Valve Disease; Risk Factors:Hypertension                 and Diabetes.  Sonographer:    Jayson Gaskins Referring Phys: [id number] SHENG L HALEY IMPRESSIONS  1. LIMITED ECHOCARDIOGRAM.  2. Left ventricular ejection fraction, by estimation, is 60 to 65%. The left ventricle has normal function.  3. Left atrial size was severely dilated.  4. The mitral valve is degenerative. Mild to moderate mitral valve regurgitation. No evidence of mitral stenosis.  5. Tricuspid valve regurgitation is mild to moderate.  6. Native aortic valve, severely calcified, severely reduced leaflet excursion, trivial aortic regurgitation, moderate to severe aortic stenosis (peak velocity 3.98 m/s, mean gradient 37 mmHg, dimensional index 0.30, AVA per VTI 0.86 cm).  7. The inferior vena cava is normal in size with greater than 50% respiratory variability, suggesting right atrial pressure of 3 mmHg. Comparison(s): A prior study was performed on 01/18/2024. Reported moderate to severe AS (peak velocity 3.37m/s, MG , AVA 1.0cm2, DI 0.3). FINDINGS  Left Ventricle: Left ventricular ejection fraction, by estimation, is 60 to 65%. The left ventricle has normal function. The left ventricular  internal cavity size was normal in size. There is no left ventricular hypertrophy. Left Atrium: Left atrial size was severely dilated. Mitral Valve: The mitral valve is degenerative in appearance. There is mild thickening of the mitral valve leaflet(s). Normal mobility of the mitral valve leaflets. Mild mitral annular calcification. Mild to moderate mitral valve regurgitation. No evidence of mitral valve stenosis. Tricuspid Valve: The tricuspid valve is grossly normal. Tricuspid valve regurgitation is mild to moderate. Aortic Valve: Native aortic valve, severely calcified, severely reduced leaflet excursion, trivial aortic regurgitation, moderate to severe aortic stenosis (peak velocity 3.98 m/s, mean gradient 37 mmHg, dimensional index 0.30, AVA per VTI 0.86 cm). There is severe aortic valve annular calcification. Aortic valve mean gradient measures 35.5 mmHg. Aortic valve peak gradient measures 60.5 mmHg. Aortic valve area, by VTI measures 0.89 cm. Pulmonic Valve: The pulmonic valve was not well visualized. Aorta: The aortic root is normal in size and structure. Venous: The inferior vena cava is normal in size with greater than 50% respiratory variability, suggesting right atrial pressure of 3 mmHg. Additional Comments: LIMITED ECHOCARDIOGRAM.  LEFT VENTRICLE PLAX 2D LVIDd:         5.06 cm LVIDs:         3.17 cm LV PW:         0.84 cm LV IVS:        0.85 cm LVOT diam:     1.96 cm LV SV:         82 LV SV Index:   43 LVOT Area:     3.02 cm  LEFT ATRIUM              Index LA Vol (A2C):   98.0 ml  51.86 ml/m LA Vol (A4C):   99.3 ml  52.54 ml/m LA Biplane Vol: 102.0 ml 53.97 ml/m  AORTIC VALVE AV Area (Vmax):    0.85 cm AV Area (Vmean):   0.95 cm AV Area (VTI):     0.89 cm AV Vmax:           389.00 cm/s AV Vmean:          285.500 cm/s AV VTI:            0.915 m AV Peak Grad:      60.5 mmHg AV Mean Grad:  35.5 mmHg LVOT Vmax:         109.00 cm/s LVOT Vmean:        89.900 cm/s LVOT VTI:          0.271 m  LVOT/AV VTI ratio: 0.30  AORTA Ao Root diam: 3.21 cm  SHUNTS Systemic VTI:  0.27 m Systemic Diam: 1.96 cm Sunit Tolia Electronically signed by Madonna Large Signature Date/Time: 02/23/2024/5:29:54 PM    Final    CT Head Wo Contrast Result Date: 02/21/2024 CLINICAL DATA:  Headache, increasing frequency or severity EXAM: CT HEAD WITHOUT CONTRAST TECHNIQUE: Contiguous axial images were obtained from the base of the skull through the vertex without intravenous contrast. RADIATION DOSE REDUCTION: This exam was performed according to the departmental dose-optimization program which includes automated exposure control, adjustment of the mA and/or kV according to patient size and/or use of iterative reconstruction technique. COMPARISON:  Brain MRI and head CT a 01/18/2024 FINDINGS: Brain: Subcentimeter hyperdense foci in the left occipital lobe persists but slightly diminished from prior CT, series 2, images 22 and 25. No evidence of new hemorrhage. Stable degree of atrophy and chronic small vessel ischemia. Chronic right MCA infarct, unchanged in appearance. There is no subdural collection. No midline shift. Vascular: Atherosclerosis of skullbase vasculature without hyperdense vessel or abnormal calcification. Skull: No fracture or focal lesion. Sinuses/Orbits: No acute findings. Again seen partial opacification of right ethmoid air cells. No mastoid effusion. Bilateral cataract resection. Other: None. IMPRESSION: 1. Subcentimeter hyperdense foci in the left occipital lobe persist but slightly diminished from prior CT, likely resolving hemorrhage. No evidence of new hemorrhage or other acute findings. 2. Stable atrophy, chronic small vessel ischemia, and chronic right MCA infarct. Electronically Signed   By: Andrea Gasman M.D.   On: 02/21/2024 16:14   US  Venous Img Lower Bilateral Result Date: 02/21/2024 CLINICAL DATA:  Bilateral lower leg swelling for 3 days. EXAM: BILATERAL LOWER EXTREMITY VENOUS DOPPLER ULTRASOUND  TECHNIQUE: Gray-scale sonography with graded compression, as well as color Doppler and duplex ultrasound were performed to evaluate the lower extremity deep venous systems from the level of the common femoral vein and including the common femoral, femoral, profunda femoral, popliteal and calf veins including the posterior tibial, peroneal and gastrocnemius veins when visible. The superficial great saphenous vein was also interrogated. Spectral Doppler was utilized to evaluate flow at rest and with distal augmentation maneuvers in the common femoral, femoral and popliteal veins. COMPARISON:  None Available. FINDINGS: RIGHT LOWER EXTREMITY Common Femoral Vein: No evidence of thrombus. Normal compressibility, respiratory phasicity and response to augmentation. Saphenofemoral Junction: No evidence of thrombus. Normal compressibility and flow on color Doppler imaging. Profunda Femoral Vein: No evidence of thrombus. Normal compressibility and flow on color Doppler imaging. Femoral Vein: No evidence of thrombus. Normal compressibility, respiratory phasicity and response to augmentation. Popliteal Vein: No evidence of thrombus. Normal compressibility, respiratory phasicity and response to augmentation. Calf Veins: No evidence of thrombus. Normal compressibility and flow on color Doppler imaging. Superficial Great Saphenous Vein: No evidence of thrombus. Normal compressibility. Venous Reflux:  None. Other Findings:  Soft tissue edema. LEFT LOWER EXTREMITY Common Femoral Vein: No evidence of thrombus. Normal compressibility, respiratory phasicity and response to augmentation. Saphenofemoral Junction: No evidence of thrombus. Normal compressibility and flow on color Doppler imaging. Profunda Femoral Vein: No evidence of thrombus. Normal compressibility and flow on color Doppler imaging. Femoral Vein: No evidence of thrombus. Normal compressibility, respiratory phasicity and response to augmentation. Popliteal Vein: No evidence  of thrombus. Normal  compressibility, respiratory phasicity and response to augmentation. Calf Veins: No evidence of thrombus. Normal compressibility and flow on color Doppler imaging. Superficial Great Saphenous Vein: No evidence of thrombus. Normal compressibility. Venous Reflux:  None. Other Findings:  Soft tissue edema. IMPRESSION: No evidence of deep venous thrombosis in either lower extremity. Electronically Signed   By: Newell Eke M.D.   On: 02/21/2024 16:03   DG Chest 2 View Result Date: 02/21/2024 CLINICAL DATA:  Edema. EXAM: CHEST - 2 VIEW COMPARISON:  01/17/2024 and CT chest 11/29/2017. FINDINGS: Trachea is midline. Heart is at the upper limits of normal in size. Thoracic aorta is calcified. Small right pleural effusion with right basilar atelectasis. Trace left pleural effusion. Flowing anterior osteophytosis in the thoracic spine. IMPRESSION: 1. Small right pleural effusion with right basilar atelectasis. 2. Trace left pleural effusion. Electronically Signed   By: Newell Eke M.D.   On: 02/21/2024 15:42    Microbiology: Results for orders placed or performed during the hospital encounter of 03/23/23  SARS Coronavirus 2 by RT PCR (hospital order, performed in [facility] hospital lab) *cepheid single result test* Anterior Nasal Swab     Status: Abnormal   Collection Time: 03/23/23 10:39 PM   Specimen: Anterior Nasal Swab  Result Value Ref Range Status   SARS Coronavirus 2 by RT PCR POSITIVE (A) NEGATIVE Final    Comment: (NOTE) SARS-CoV-2 target nucleic acids are DETECTED  SARS-CoV-2 RNA is generally detectable in upper respiratory specimens  during the acute phase of infection.  Positive results are indicative  of the presence of the identified virus, but do not rule out bacterial infection or co-infection with other pathogens not detected by the test.  Clinical correlation with patient history and  other diagnostic information is necessary to determine patient infection  status.  The expected result is negative.  Fact Sheet for Patients:   RoadLapTop.co.za   Fact Sheet for Healthcare Providers:   http://kim-miller.com/    This test is not yet approved or cleared by the [place]  FDA and  has been authorized for detection and/or diagnosis of SARS-CoV-2 by FDA under an Emergency Use Authorization (EUA).  This EUA will remain in effect (meaning this test can be used) for the duration of  the COVID-19 declaration under Section 564(b)(1)  of the Act, 21 U.S.C. section 360-bbb-3(b)(1), unless the authorization is terminated or revoked sooner.   Performed at [facility], [address], [place], [place] [address]     Labs: CBC: Recent Labs  Lab 02/21/24 1444 02/23/24 0324 02/24/24 0243  WBC 4.9 4.9 5.1  NEUTROABS 2.7  --   --   HGB 12.8* 11.9* 12.5*  HCT 37.0* 34.5* 35.9*  MCV 93.0 90.8 92.5  PLT 143* 160 152   Basic Metabolic Panel: Recent Labs  Lab 02/21/24 1444 02/22/24 0301 02/23/24 0324 02/24/24 0243  NA 143 141 140 139  K 4.1 3.2* 3.5 4.5  CL 107 105 102 105  CO2 24 25 27 25   GLUCOSE 114* 106* 105* 101*  BUN 25* 24* 32* 29*  CREATININE 1.49* 1.65* 2.05* 1.60*  CALCIUM  9.2 8.6* 8.7* 8.7*  MG  --   --  1.8 2.0   Liver Function Tests: Recent Labs  Lab 02/22/24 1012  AST 23  ALT 17  ALKPHOS 81  BILITOT 1.0  PROT 6.5  ALBUMIN  3.1*   CBG: No results for input(s): GLUCAP in the last 168 hours.  Discharge time spent: 38 minutes.  Length of inpatient stay:  3 days  Signed: Carliss LELON Canales, DO [name] 02/24/2024

## 2024-02-24 NOTE — Progress Notes (Signed)
 Pt hx of dementia. Daughter to arrive at 1400. RN to discharge at that time.  Med detail list completed and Lauraine RN updated.

## 2024-02-24 NOTE — Plan of Care (Signed)
  Problem: Health Behavior/Discharge Planning: Goal: Ability to manage health-related needs will improve Outcome: Progressing   Problem: Clinical Measurements: Goal: Will remain free from infection Outcome: Progressing   Problem: Clinical Measurements: Goal: Diagnostic test results will improve Outcome: Progressing   

## 2024-02-24 NOTE — Telephone Encounter (Signed)
 Patient was admitted back into the hospital.  Daughter will call once patient is dismissed to schedule hospital fu.

## 2024-02-24 NOTE — Progress Notes (Signed)
  Progress Note  Patient Name: Calvin Burnett Date of Encounter: 02/24/2024 [facility] Cardiologist: Redell Shallow, MD   Interval Summary   Comfortable.  Had lengthy conversation with daughter.  Sister now at bedside.  Vital Signs Vitals:   02/23/24 2045 02/24/24 0020 02/24/24 0500 02/24/24 0723  BP: 129/77 124/65 124/68 (!) 113/91  Pulse: 60 (!) 58    Resp: 20 16 18 14   Temp: 97.8 F (36.6 C) 98.4 F (36.9 C) 97.6 F (36.4 C) 97.8 F (36.6 C)  TempSrc: Oral Oral Oral Oral  SpO2: 95% 94%    Weight:   69.6 kg   Height:        Intake/Output Summary (Last 24 hours) at 02/24/2024 1110 Last data filed at 02/24/2024 0500 Gross per 24 hour  Intake 80 ml  Output 1325 ml  Net -1245 ml      02/24/2024    5:00 AM 02/23/2024    4:08 AM 02/22/2024    4:36 AM  Last 3 Weights  Weight (lbs) 153 lb 7 oz 158 lb 4.6 oz 152 lb 1.9 oz  Weight (kg) 69.6 kg 71.8 kg 69 kg      Telemetry/ECG  Atrial fibrillation at times heart rates in the 40s to 50s- Personally Reviewed  Physical Exam  GEN: No acute distress.   Neck: No JVD Cardiac: Irregularly irregular, no murmurs, rubs, or gallops.  Respiratory: Clear to auscultation bilaterally. GI: Soft, nontender, non-distended  MS: No edema  Assessment & Plan   AKI - Creatinine from 2 down to 1.6.  Excellent. -Okay to start p.o. Lasix .  Atrial fibrillation with slow ventricular response - Heart rates in the 40s at times especially during sleep.  He is comfortable, mounting excellent blood pressure.  No syncope.  No pacemaker indications.  Explained to daughter yesterday.  Moderate to severe aortic valve stenosis, mild to moderate mitral valve regurgitation - Echocardiogram reviewed.  Moderate to severe aortic stenosis, mean gradient 25 mmHg.  Does not require TAVR at this point.  As his dementia advances, may not be a good candidate for further invasive therapies.  Intracranial hemorrhage cerebral amyloid angiopathy -May not  be a candidate for invasive watchman therapy based upon advancing dementia.  Has upcoming consultation to discuss further.  Would avoid Eliquis  at this time due to recent intracranial hemorrhage.  Okay with discharge from cardiac perspective.  We will go ahead and sign off.   For questions or updates, please contact [facility] Please consult [email or web address] for contact info under       Signed, Oneil Parchment, MD

## 2024-02-25 ENCOUNTER — Telehealth: Payer: Self-pay | Admitting: Family Medicine

## 2024-02-25 ENCOUNTER — Telehealth: Payer: Self-pay | Admitting: *Deleted

## 2024-02-25 NOTE — Transitions of Care (Post Inpatient/ED Visit) (Signed)
   02/25/2024  Name: Calvin Burnett MRN: [id number] DOB: [birth year]  Today's TOC FU Call Status: Today's TOC FU Call Status:: Unsuccessful Call (1st Attempt) Unsuccessful Call (1st Attempt) Date: 02/25/24  Attempted to reach the patient regarding the most recent Inpatient/ED visit.  Follow Up Plan: Additional outreach attempts will be made to reach the patient to complete the Transitions of Care (Post Inpatient/ED visit) call.   Mliss Creed [facility], BSN RN Care Manager/ Transition of Care [employer]/ [facility] [phone]

## 2024-02-25 NOTE — Telephone Encounter (Signed)
 Patient dropped off document Physician's Statement, to be filled out by provider. Patient requested to send it back via Call Patient to pick up within 5-days. Document is located in providers tray at front office.Please advise at Mobile [phone] (mobile)

## 2024-02-26 ENCOUNTER — Telehealth: Payer: Self-pay

## 2024-02-26 NOTE — Telephone Encounter (Signed)
 Placed into provider basket in office to review and sign.

## 2024-02-26 NOTE — Transitions of Care (Post Inpatient/ED Visit) (Signed)
   02/26/2024  Name: TANVIR HIPPLE MRN: [id number] DOB: 07/30/1933  Today's TOC FU Call Status: Today's TOC FU Call Status:: Unsuccessful Call (2nd Attempt) Unsuccessful Call (2nd Attempt) Date: 02/26/24  Attempted to reach the patient regarding the most recent Inpatient/ED visit.  Follow Up Plan: Additional outreach attempts will be made to reach the patient to complete the Transitions of Care (Post Inpatient/ED visit) call.   Shona Prow RN, CCM [place]  VBCI-Population Health RN Care Manager [phone]

## 2024-02-26 NOTE — Telephone Encounter (Signed)
 I will need to assess him in person for this- lets try to get him in for hospital follow up within next 2 weeks- I placed this back on your desk for safe keeping

## 2024-02-27 ENCOUNTER — Telehealth: Payer: Self-pay | Admitting: *Deleted

## 2024-02-27 DIAGNOSIS — N529 Male erectile dysfunction, unspecified: Secondary | ICD-10-CM | POA: Diagnosis not present

## 2024-02-27 DIAGNOSIS — S066XAD Traumatic subarachnoid hemorrhage with loss of consciousness status unknown, subsequent encounter: Secondary | ICD-10-CM | POA: Diagnosis not present

## 2024-02-27 DIAGNOSIS — I110 Hypertensive heart disease with heart failure: Secondary | ICD-10-CM | POA: Diagnosis not present

## 2024-02-27 DIAGNOSIS — E1169 Type 2 diabetes mellitus with other specified complication: Secondary | ICD-10-CM | POA: Diagnosis not present

## 2024-02-27 DIAGNOSIS — I4821 Permanent atrial fibrillation: Secondary | ICD-10-CM | POA: Diagnosis not present

## 2024-02-27 DIAGNOSIS — I350 Nonrheumatic aortic (valve) stenosis: Secondary | ICD-10-CM | POA: Diagnosis not present

## 2024-02-27 DIAGNOSIS — Z8673 Personal history of transient ischemic attack (TIA), and cerebral infarction without residual deficits: Secondary | ICD-10-CM | POA: Diagnosis not present

## 2024-02-27 DIAGNOSIS — F325 Major depressive disorder, single episode, in full remission: Secondary | ICD-10-CM | POA: Diagnosis not present

## 2024-02-27 DIAGNOSIS — I5033 Acute on chronic diastolic (congestive) heart failure: Secondary | ICD-10-CM | POA: Diagnosis not present

## 2024-02-27 DIAGNOSIS — M109 Gout, unspecified: Secondary | ICD-10-CM | POA: Diagnosis not present

## 2024-02-27 DIAGNOSIS — E538 Deficiency of other specified B group vitamins: Secondary | ICD-10-CM | POA: Diagnosis not present

## 2024-02-27 DIAGNOSIS — E785 Hyperlipidemia, unspecified: Secondary | ICD-10-CM | POA: Diagnosis not present

## 2024-02-27 DIAGNOSIS — F0153 Vascular dementia, unspecified severity, with mood disturbance: Secondary | ICD-10-CM | POA: Diagnosis not present

## 2024-02-27 DIAGNOSIS — K219 Gastro-esophageal reflux disease without esophagitis: Secondary | ICD-10-CM | POA: Diagnosis not present

## 2024-02-27 DIAGNOSIS — H8110 Benign paroxysmal vertigo, unspecified ear: Secondary | ICD-10-CM | POA: Diagnosis not present

## 2024-02-27 DIAGNOSIS — E1122 Type 2 diabetes mellitus with diabetic chronic kidney disease: Secondary | ICD-10-CM | POA: Diagnosis not present

## 2024-02-27 DIAGNOSIS — I700 Atherosclerosis of aorta: Secondary | ICD-10-CM | POA: Diagnosis not present

## 2024-02-27 DIAGNOSIS — N1832 Chronic kidney disease, stage 3b: Secondary | ICD-10-CM | POA: Diagnosis not present

## 2024-02-27 DIAGNOSIS — N179 Acute kidney failure, unspecified: Secondary | ICD-10-CM | POA: Diagnosis not present

## 2024-02-27 DIAGNOSIS — Z556 Problems related to health literacy: Secondary | ICD-10-CM | POA: Diagnosis not present

## 2024-02-27 DIAGNOSIS — Z5982 Transportation insecurity: Secondary | ICD-10-CM | POA: Diagnosis not present

## 2024-02-27 DIAGNOSIS — M47812 Spondylosis without myelopathy or radiculopathy, cervical region: Secondary | ICD-10-CM | POA: Diagnosis not present

## 2024-02-27 DIAGNOSIS — Z7984 Long term (current) use of oral hypoglycemic drugs: Secondary | ICD-10-CM | POA: Diagnosis not present

## 2024-02-27 DIAGNOSIS — I50810 Right heart failure, unspecified: Secondary | ICD-10-CM | POA: Diagnosis not present

## 2024-02-27 NOTE — Transitions of Care (Post Inpatient/ED Visit) (Signed)
   02/27/2024  Name: Calvin Burnett MRN: [id number] DOB: [birth year]  Today's TOC FU Call Status: Today's TOC FU Call Status:: Unsuccessful Call (3rd Attempt) Unsuccessful Call (3rd Attempt) Date: 02/27/24  Attempted to reach the patient regarding the most recent Inpatient/ED visit.  Follow Up Plan: No further outreach attempts will be made at this time. We have been unable to contact the patient.  Mliss Creed [facility], BSN RN Care Manager/ Transition of Care [place]/ [facility] [phone]

## 2024-03-01 ENCOUNTER — Ambulatory Visit (HOSPITAL_COMMUNITY)
Admission: RE | Admit: 2024-03-01 | Discharge: 2024-03-01 | Disposition: A | Discharge status: Home / Self Care | Source: Ambulatory Visit | Attending: Family Medicine | Admitting: Family Medicine

## 2024-03-01 ENCOUNTER — Telehealth: Payer: Self-pay | Admitting: Family Medicine

## 2024-03-01 DIAGNOSIS — I4821 Permanent atrial fibrillation: Secondary | ICD-10-CM | POA: Diagnosis not present

## 2024-03-01 DIAGNOSIS — I629 Nontraumatic intracranial hemorrhage, unspecified: Secondary | ICD-10-CM | POA: Diagnosis not present

## 2024-03-01 DIAGNOSIS — Q2112 Patent foramen ovale: Secondary | ICD-10-CM | POA: Diagnosis not present

## 2024-03-01 MED ORDER — IOHEXOL 350 MG/ML SOLN
80.0000 mL | Freq: Once | INTRAVENOUS | Status: AC | PRN
  Administered 2024-03-01: 80 mL via INTRAVENOUS

## 2024-03-01 NOTE — Telephone Encounter (Signed)
 Noted, will await appt to be scheduled.

## 2024-03-01 NOTE — Telephone Encounter (Signed)
 LVM 2x to schedule ov with provider

## 2024-03-01 NOTE — Telephone Encounter (Signed)
 Advised to see PCP within 3 days. LVM to schedule ov with primary  Patient Name First: Calvin COY Last: Burnett Gender: Male DOB: [birth year] Age: 89 Y 2 M 30 D Return Phone Number: [phone] (Primary), [phone] (Secondary) Address: City/ State/ Zip: [place]  [address] Client [facility] Night - Human resources officer [facility] Night Provider Katrinka Senior- MD Contact Type Call Who Is Calling Patient / Member / Family / Caregiver Call Type Triage / Clinical Caller Name [name] Gavel Caller Phone Number [phone] Relationship To Patient Daughter Return Phone Number [phone] (Primary) Chief Complaint Feet swelling Reason for Call Symptomatic / Request for Health Information Initial Comment Caller states her dad just got out of the hospital on Tuesday for his feet swelling, and today his feet have started swelling again. Translation No Nurse Assessment Nurse: Marylen, RN, Ashante Date/Time (Eastern Time): 02/27/2024 11:37:38 PM Confirm and document reason for call. If symptomatic, describe symptoms. ---The caller states that her father has had swelling to his feet that has been mild. Does the patient have any new or worsening symptoms? ---Yes Will a triage be completed? ---Yes Related visit to physician within the last 2 weeks? ---Yes Does the PT have any chronic conditions? (i.e. diabetes, asthma, this includes High risk factors for pregnancy, etc.) ---Yes List chronic conditions. ---heart disease, dementia, afib Is this a behavioral health or substance abuse call? ---No Guidelines Guideline Title Affirmed Question Affirmed Notes Nurse Date/Time Titus Time) Leg Swelling and Edema [1] MILD swelling of both ankles (i.e., pedal edema) AND [2] new-onset or getting worse Marylen, RN, Ashante 02/27/2024 11:40:10 PM Disp. Time Titus Time) Disposition Final User 02/27/2024 11:42:28 PM SEE PCP  WITHIN 3 DAYS Yes Marylen, RN, Ashante Final Disposition 02/27/2024 11:42:28 PM SEE PCP WITHIN 3 DAYS Yes Marylen, RN, Ashante Caller Disagree/Comply Comply Caller Understands Yes PreDisposition Did not know what to do Care Advice Given Per Guideline SEE PCP WITHIN 3 DAYS: * PCP VISIT: Call your doctor (or NP/PA) during regular office hours and make an appointment. A clinic or urgent care center are good places to go for care if your doctor's office is closed or you can't get an appointment. NOTE: If office will be open tomorrow, tell caller to call then, not in 3 days. HOW TO DECREASE ANKLE AND LOWER LEG SWELLING: * Elevate your legs or try to lie down one or two times a day for 20 minutes. * Avoid socks with an elastic band at the top. Wear comfortable shoes. * Avoid extra salt in your diet. CALL BACK IF: * Swelling becomes worse * Swelling becomes red or painful to the touch * Calf pain occurs and becomes constant * You become worse CARE ADVICE given per Leg Swelling and Edema (Adult) guideline. Referrals REFERRED TO PCP OFFICE

## 2024-03-04 ENCOUNTER — Telehealth: Payer: Self-pay | Admitting: Family Medicine

## 2024-03-04 ENCOUNTER — Encounter: Payer: Self-pay | Admitting: Internal Medicine

## 2024-03-04 ENCOUNTER — Ambulatory Visit (INDEPENDENT_AMBULATORY_CARE_PROVIDER_SITE_OTHER): Admitting: Internal Medicine

## 2024-03-04 VITALS — BP 110/82 | HR 57 | Temp 98.0°F | Ht 70.0 in

## 2024-03-04 DIAGNOSIS — Z8673 Personal history of transient ischemic attack (TIA), and cerebral infarction without residual deficits: Secondary | ICD-10-CM

## 2024-03-04 DIAGNOSIS — F01B0 Vascular dementia, moderate, without behavioral disturbance, psychotic disturbance, mood disturbance, and anxiety: Secondary | ICD-10-CM

## 2024-03-04 DIAGNOSIS — I619 Nontraumatic intracerebral hemorrhage, unspecified: Secondary | ICD-10-CM | POA: Insufficient documentation

## 2024-03-04 DIAGNOSIS — I509 Heart failure, unspecified: Secondary | ICD-10-CM

## 2024-03-04 DIAGNOSIS — N189 Chronic kidney disease, unspecified: Secondary | ICD-10-CM | POA: Diagnosis not present

## 2024-03-04 DIAGNOSIS — N179 Acute kidney failure, unspecified: Secondary | ICD-10-CM

## 2024-03-04 DIAGNOSIS — S51811A Laceration without foreign body of right forearm, initial encounter: Secondary | ICD-10-CM

## 2024-03-04 DIAGNOSIS — N1831 Chronic kidney disease, stage 3a: Secondary | ICD-10-CM

## 2024-03-04 DIAGNOSIS — F09 Unspecified mental disorder due to known physiological condition: Secondary | ICD-10-CM | POA: Diagnosis not present

## 2024-03-04 LAB — BRAIN NATRIURETIC PEPTIDE: Pro B Natriuretic peptide (BNP): 125 pg/mL — ABNORMAL HIGH (ref 0.0–100.0)

## 2024-03-04 LAB — RENAL FUNCTION PANEL
Albumin: 4.4 g/dL (ref 3.5–5.2)
BUN: 25 mg/dL — ABNORMAL HIGH (ref 6–23)
CO2: 30 meq/L (ref 19–32)
Calcium: 9.2 mg/dL (ref 8.4–10.5)
Chloride: 100 meq/L (ref 96–112)
Creatinine, Ser: 1.44 mg/dL (ref 0.40–1.50)
GFR: 43.46 mL/min — ABNORMAL LOW (ref >60.00)
Glucose, Bld: 93 mg/dL (ref 70–99)
Phosphorus: 3.7 mg/dL (ref 2.3–4.6)
Potassium: 3.8 meq/L (ref 3.5–5.1)
Sodium: 140 meq/L (ref 135–145)

## 2024-03-04 LAB — URINALYSIS, ROUTINE W REFLEX MICROSCOPIC
Bilirubin Urine: NEGATIVE
Ketones, ur: NEGATIVE
Leukocytes,Ua: NEGATIVE
Nitrite: NEGATIVE
Specific Gravity, Urine: 1.02 (ref 1.000–1.030)
Total Protein, Urine: 100 — AB
Urine Glucose: NEGATIVE
Urobilinogen, UA: 1 (ref 0.0–1.0)
pH: 6.5 (ref 5.0–8.0)

## 2024-03-04 LAB — VITAMIN D 25 HYDROXY (VIT D DEFICIENCY, FRACTURES): VITD: 19.28 ng/mL — ABNORMAL LOW (ref 30.00–100.00)

## 2024-03-04 LAB — MAGNESIUM: Magnesium: 1.9 mg/dL (ref 1.5–2.5)

## 2024-03-04 LAB — B12 AND FOLATE PANEL
Folate: 15.7 ng/mL (ref >5.9)
Vitamin B-12: 541 pg/mL (ref 211–911)

## 2024-03-04 LAB — MICROALBUMIN / CREATININE URINE RATIO
Creatinine,U: 90.7 mg/dL
Microalb Creat Ratio: 666.6 mg/g — ABNORMAL HIGH (ref 0.0–30.0)
Microalb, Ur: 60.5 mg/dL — ABNORMAL HIGH (ref 0.0–1.9)

## 2024-03-04 NOTE — Progress Notes (Signed)
 ==============================  [facility] [facility]: [phone]   -- Medical Office Visit --  Patient: Calvin Burnett      Age: 89 y.o.       Sex:  male  Date:   03/04/2024 Today's Healthcare Provider: Bernardino KANDICE Cone, MD  ==============================   Chief Complaint: Hospitalization Follow-up (Swelling little in feet at night time.)  Context: Primary Care Provider Dr. Katrinka out of office, daughter brings patient in Calvin provides almost all history on behalf of patient with moderate severe memory loss.  Discussed the use of AI scribe software for clinical note transcription with the patient, who gave verbal consent to proceed.  History of Present Illness  89 year old male with a history of stroke Calvin memory issues who presents for follow-up after recent hospitalizations.  His daughter is concerned about his mental competency Calvin is seeking a mental competency evaluation to address legal Calvin estate matters, as his previous power of attorney has withdrawn, leaving him without a guardian.  He has been experiencing memory problems, which have been noted by prior doctors, Calvin he is currently on memantine  for this issue. His daughter reports that he is not aware of his memory problems, Calvin she assists with his medication management. He no longer drives Calvin requires a sitter when his daughter is at work. Cameras have been set up in the home to monitor his safety.  He had a stroke on July 5th, which led to hospitalization Calvin rehabilitation. During this time, he experienced swelling in his legs. He was found to have a low heart rate Calvin was hospitalized again for further evaluation. His heart rate was noted to be in the 30s, Calvin there was a discussion about the need for a pacemaker.  He has a history of bleeding in the brain, possibly related to his previous use of Eliquis , which was discontinued. He is currently on furosemide  for fluid management, Calvin his  daughter reports that his leg swelling has improved. He has had a colectomy in the past Calvin wants to avoid further surgeries.   Echocardiogram was fairly normal at hospital Calvin swelling is down to day on  lasix  but there have been recent kidney concern(s). Lab Results  Component Value Date   GFR 43.46 (L) 03/04/2024   GFR 47.48 (L) 11/12/2023   GFR 45.97 (L) 06/11/2023   GFR 48.67 (L) 11/28/2022   GFR 34.12 (L) 05/30/2022   Recent Labs  Lab 02/21/24 1444 02/22/24 0301 02/23/24 0324 02/24/24 0243  NA 143 141 140 139  K 4.1 3.2* 3.5 4.5  CL 107 105 102 105  CO2 24 25 27 25   GLUCOSE 114* 106* 105* 101*  BUN 25* 24* 32* 29*  CREATININE 1.49* 1.65* 2.05* 1.60*  CALCIUM  9.2 8.6* 8.7* 8.7*  MG  --   --  1.8 2.0   His daughter is actively involved in his care, managing his medications, Calvin ensuring his safety at home.  He initially reports he lives alone, but later endorses that she does in fact live with him for past 14 months.  She is also dealing with legal Calvin family issues related to his estate Calvin guardianship, which have been complicated by family disagreements.  Background Reviewed: Problem List: has Essential hypertension; GERD (gastroesophageal reflux disease); Basal cell carcinoma; Cervical spondylosis without myelopathy; LEG CRAMPS, NOCTURNAL; Vascular dementia ([name]); Type 2 diabetes mellitus ([name]); Hyperlipidemia associated with type 2 diabetes mellitus ([name]); Chronic kidney disease, stage 3a ([name]); Acute kidney  injury superimposed on chronic kidney disease ([name]); Syncope; Symptomatic bradycardia; Bowel habit changes; Rectal pain; History of adenomatous polyp of colon; Major depressive disorder with single episode, in full remission ([name]); Night sweats; Atrial fibrillation with slow ventricular response ([name]); pT1pN0 colon cancer s/p robotic right colectomy 11/26/2017; Right inguinal hernia; 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 Past Medical History:  has a past medical history of Basal cell carcinoma, Diabetes mellitus, ED (erectile dysfunction), GERD (gastroesophageal reflux disease), History of basal cell carcinoma excision, History of cerebral parenchymal hemorrhage, Hypertension, Nodular basal cell carcinoma (BCC) (03/28/2021), Prostate cancer ([name]) (08/12/2012), SCCA (squamous cell carcinoma) of skin (03/28/2021), SCCA (squamous cell carcinoma) of skin (03/28/2021), Squamous cell carcinoma of skin, Calvin Superficial nodular basal cell carcinoma (BCC) (03/28/2021). Past Surgical History:   has a past surgical history that includes Prostate biopsy (08/12/12); Appendectomy (age 35); Cholecystectomy (1980); Cataract extraction w/ intraocular lens  implant, bilateral; Radioactive seed implant (N/A, 12/03/2012); XI robotic assisted lower anterior resection (N/A, 11/26/2017); Proctoscopy (N/A, 11/26/2017); Colonoscopy (N/A, 11/26/2017); laparotomy (N/A, 04/27/2019); Calvin Colon resection (N/A, 04/27/2019). Social History:   reports that he quit smoking about 33 years ago. His smoking use included cigars Calvin cigarettes. He started smoking about 35 years ago. He has a 2 pack-year smoking history. He has never used smokeless tobacco. He reports that he does not drink alcohol  Calvin does not use drugs. Family History:  family history includes COPD in his mother; Cancer in his daughter; Diabetes in his daughter Calvin sister; Emphysema in his mother; Lung cancer in his  father. Allergies:  is allergic to kcentra  [prothrombin  complex conc human].   Medication Reconciliation: Current Outpatient Medications on File Prior to Visit  Medication Sig   Acetaminophen  (TYLENOL  PO) Take 1-2 tablets by mouth as needed.   furosemide  (LASIX ) 20 MG tablet Take 1 tablet (20 mg total) by mouth daily.   latanoprost  (XALATAN ) 0.005 % ophthalmic solution Place 1 drop into both eyes at bedtime.    metFORMIN  (GLUCOPHAGE -XR) 500 MG 24 hr tablet TAKE 1 TABLET BY MOUTH EVERY DAY WITH BREAKFAST   rosuvastatin  (CRESTOR ) 20 MG tablet Take 1 tablet (20 mg total) by mouth 2 (two) times a week.   amLODipine  (NORVASC ) 10 MG tablet Take 1 tablet (10 mg total) by mouth daily. (Patient taking differently: Take 10 mg by mouth in the morning.)   escitalopram  (LEXAPRO ) 5 MG tablet Take 1 tablet (5 mg total) by mouth daily. (Patient taking differently: Take 5 mg by mouth in the morning.)   melatonin 3 MG TABS tablet Take 2 tablets (6 mg total) by mouth at bedtime as needed. (Patient taking differently: Take 3 mg by mouth at bedtime.)   No current facility-administered medications on file prior to visit.  There are no discontinued medications.   Physical Exam:    03/04/2024   10:44 AM 03/01/2024    3:30 PM 02/24/2024   11:00 AM  Vitals with BMI  Height 5' 10    Systolic 110 123 862  Diastolic 82 72 73  Pulse 57 48   Vital signs reviewed.  Nursing notes reviewed. Weight trend reviewed. Physical Activity: Not on file  General Appearance:  No acute distress appreciable.   Well-groomed, healthy-appearing male.  Well proportioned with no abnormal fat distribution.  Good muscle tone. Pulmonary:  Normal work of breathing at rest, no respiratory distress apparent. SpO2: 96 %  Musculoskeletal: All extremities are intact.  Neurological:  Awake, alert, oriented, Calvin engaged.  No obvious focal neurological deficits.  Patient is attentive.   Speech is clear Calvin coherent with logical content.   However, there is severe poverty of thought, very few words spoken, Calvin often confabulates unintentionally (I live alone, I don't have a memory problem) knows the year Calvin his Primary Care Provider (PCP) name but none of his medication(s) or medical problems. Psychiatric:  Appropriate mood, pleasant Calvin cooperative demeanor, thoughtful Calvin engaged during the exam  Verbalized to patient: Physical Exam SKIN: Skin on legs is normal. Arm wound is healing with no signs of infection.  Results:    03/04/2024   10:55 AM 11/12/2023    9:49 AM 06/11/2023   10:58 AM 11/28/2022    8:57 AM  PHQ 2/9 Scores  PHQ - 2 Score 0 0 0 0  PHQ- 9 Score 0 0 1 0    Verbalized to patient: Results RADIOLOGY Brain CT: Intracerebral hemorrhage  DIAGNOSTIC Heart rate: 30 beats per minute    Results for orders placed or performed in visit on 03/04/24  Renal function panel  Result Value Ref Range   Sodium 140 135 - 145 mEq/L   Potassium 3.8 3.5 - 5.1 mEq/L   Chloride 100 96 - 112 mEq/L   CO2 30 19 - 32 mEq/L   Albumin  4.4 3.5 - 5.2 g/dL   BUN 25 (H) 6 - 23 mg/dL   Creatinine, Ser 8.55 0.40 - 1.50 mg/dL   Glucose, Bld 93 70 - 99 mg/dL   Phosphorus 3.7 2.3 - 4.6 mg/dL   GFR 56.53 (L) >39.99 mL/min   Calcium  9.2 8.4 - 10.5 mg/dL  Microalbumin / creatinine urine ratio  Result Value Ref Range   Microalb, Ur 60.5 (H) 0.0 - 1.9 mg/dL   Creatinine,U 09.2 mg/dL   Microalb Creat Ratio 666.6 (H) 0.0 - 30.0 mg/g  Magnesium   Result Value Ref Range   Magnesium  1.9 1.5 - 2.5 mg/dL  Urinalysis, Routine w reflex microscopic  Result Value Ref Range   Color, Urine YELLOW Yellow;Lt. Yellow;Straw;Dark Yellow;Amber;Green;Red;Brown   APPearance CLEAR Clear;Turbid;Slightly Cloudy;Cloudy   Specific Gravity, Urine 1.020 1.000 - 1.030   pH 6.5 5.0 - 8.0   Total Protein, Urine 100 (A) Negative   Urine Glucose NEGATIVE Negative   Ketones, ur NEGATIVE Negative   Bilirubin Urine NEGATIVE Negative   Hgb urine dipstick  TRACE-INTACT (A) Negative   Urobilinogen, UA 1.0 0.0 - 1.0   Leukocytes,Ua NEGATIVE Negative   Nitrite NEGATIVE Negative   WBC, UA 0-2/hpf 0-2/hpf   RBC / HPF 0-2/hpf 0-2/hpf   Mucus, UA Presence of (A) None   Squamous Epithelial / HPF Rare(0-4/hpf) Rare(0-4/hpf)  VITAMIN D  25 Hydroxy (Vit-D Deficiency, Fractures)  Result Value Ref Range   VITD 19.28 (L) 30.00 - 100.00 ng/mL  B Nat Peptide  Result Value Ref Range   Pro B Natriuretic peptide (BNP) 125.0 (H) 0.0 - 100.0 pg/mL  B12 Calvin Folate Panel  Result Value Ref Range   Vitamin B-12 541 211 - 911 pg/mL   Folate 15.7 >5.9 ng/mL   Office Visit on 03/04/2024  Component Date Value Ref Range Status   Sodium 03/04/2024 140  135 - 145 mEq/L Final  Potassium 03/04/2024 3.8  3.5 - 5.1 mEq/L Final   Chloride 03/04/2024 100  96 - 112 mEq/L Final   CO2 03/04/2024 30  19 - 32 mEq/L Final   Albumin  03/04/2024 4.4  3.5 - 5.2 g/dL Final   BUN 91/78/7974 25 (H)  6 - 23 mg/dL Final   Creatinine, Ser 03/04/2024 1.44  0.40 - 1.50 mg/dL Final   Glucose, Bld 91/78/7974 93  70 - 99 mg/dL Final   Phosphorus 91/78/7974 3.7  2.3 - 4.6 mg/dL Final   GFR 91/78/7974 43.46 (L)  >60.00 mL/min Final   Calcium  03/04/2024 9.2  8.4 - 10.5 mg/dL Final   Microalb, Ur 91/78/7974 60.5 (H)  0.0 - 1.9 mg/dL Final   Creatinine,U 91/78/7974 90.7  mg/dL Final   Microalb Creat Ratio 03/04/2024 666.6 (H)  0.0 - 30.0 mg/g Final   Magnesium  03/04/2024 1.9  1.5 - 2.5 mg/dL Final   Color, Urine 91/78/7974 YELLOW  Yellow;Lt. Yellow;Straw;Dark Yellow;Amber;Green;Red;Brown Final   APPearance 03/04/2024 CLEAR  Clear;Turbid;Slightly Cloudy;Cloudy Final   Specific Gravity, Urine 03/04/2024 1.020  1.000 - 1.030 Final   pH 03/04/2024 6.5  5.0 - 8.0 Final   Total Protein, Urine 03/04/2024 100 (A)  Negative Final   Urine Glucose 03/04/2024 NEGATIVE  Negative Final   Ketones, ur 03/04/2024 NEGATIVE  Negative Final   Bilirubin Urine 03/04/2024 NEGATIVE  Negative Final   Hgb urine  dipstick 03/04/2024 TRACE-INTACT (A)  Negative Final   Urobilinogen, UA 03/04/2024 1.0  0.0 - 1.0 Final   Leukocytes,Ua 03/04/2024 NEGATIVE  Negative Final   Nitrite 03/04/2024 NEGATIVE  Negative Final   WBC, UA 03/04/2024 0-2/hpf  0-2/hpf Final   RBC / HPF 03/04/2024 0-2/hpf  0-2/hpf Final   Mucus, UA 03/04/2024 Presence of (A)  None Final   Squamous Epithelial / HPF 03/04/2024 Rare(0-4/hpf)  Rare(0-4/hpf) Final   VITD 03/04/2024 19.28 (L)  30.00 - 100.00 ng/mL Final   Pro B Natriuretic peptide (BNP) 03/04/2024 125.0 (H)  0.0 - 100.0 pg/mL Final   Vitamin B-12 03/04/2024 541  211 - 911 pg/mL Final   Folate 03/04/2024 15.7  >5.9 ng/mL Final  Admission on 02/21/2024, Discharged on 02/24/2024  Component Date Value Ref Range Status   WBC 02/21/2024 4.9  4.0 - 10.5 K/uL Final   RBC 02/21/2024 3.98 (L)  4.22 - 5.81 MIL/uL Final   Hemoglobin 02/21/2024 12.8 (L)  13.0 - 17.0 g/dL Final   HCT 91/90/7974 37.0 (L)  39.0 - 52.0 % Final   MCV 02/21/2024 93.0  80.0 - 100.0 fL Final   MCH 02/21/2024 32.2  26.0 - 34.0 pg Final   MCHC 02/21/2024 34.6  30.0 - 36.0 g/dL Final   RDW 91/90/7974 14.2  11.5 - 15.5 % Final   Platelets 02/21/2024 143 (L)  150 - 400 K/uL Final   nRBC 02/21/2024 0.0  0.0 - 0.2 % Final   Neutrophils Relative % 02/21/2024 56  % Final   Neutro Abs 02/21/2024 2.7  1.7 - 7.7 K/uL Final   Lymphocytes Relative 02/21/2024 31  % Final   Lymphs Abs 02/21/2024 1.6  0.7 - 4.0 K/uL Final   Monocytes Relative 02/21/2024 10  % Final   Monocytes Absolute 02/21/2024 0.5  0.1 - 1.0 K/uL Final   Eosinophils Relative 02/21/2024 2  % Final   Eosinophils Absolute 02/21/2024 0.1  0.0 - 0.5 K/uL Final   Basophils Relative 02/21/2024 1  % Final   Basophils Absolute 02/21/2024 0.1  0.0 - 0.1 K/uL Final  Immature Granulocytes 02/21/2024 0  % Final   Abs Immature Granulocytes 02/21/2024 0.02  0.00 - 0.07 K/uL Final   Sodium 02/21/2024 143  135 - 145 mmol/L Final   Potassium 02/21/2024 4.1  3.5 -  5.1 mmol/L Final   Chloride 02/21/2024 107  98 - 111 mmol/L Final   CO2 02/21/2024 24  22 - 32 mmol/L Final   Glucose, Bld 02/21/2024 114 (H)  70 - 99 mg/dL Final   BUN 91/90/7974 25 (H)  8 - 23 mg/dL Final   Creatinine, Ser 02/21/2024 1.49 (H)  0.61 - 1.24 mg/dL Final   Calcium  02/21/2024 9.2  8.9 - 10.3 mg/dL Final   GFR, Estimated 02/21/2024 45 (L)  >60 mL/min Final   Anion gap 02/21/2024 12  5 - 15 Final   Pro Brain Natriuretic Peptide 02/21/2024 1,026.0 (H)  <300.0 pg/mL Final   Sodium 02/22/2024 141  135 - 145 mmol/L Final   Potassium 02/22/2024 3.2 (L)  3.5 - 5.1 mmol/L Final   Chloride 02/22/2024 105  98 - 111 mmol/L Final   CO2 02/22/2024 25  22 - 32 mmol/L Final   Glucose, Bld 02/22/2024 106 (H)  70 - 99 mg/dL Final   BUN 91/89/7974 24 (H)  8 - 23 mg/dL Final   Creatinine, Ser 02/22/2024 1.65 (H)  0.61 - 1.24 mg/dL Final   Calcium  02/22/2024 8.6 (L)  8.9 - 10.3 mg/dL Final   GFR, Estimated 02/22/2024 40 (L)  >60 mL/min Final   Anion gap 02/22/2024 11  5 - 15 Final   Total Protein 02/22/2024 6.5  6.5 - 8.1 g/dL Final   Albumin  02/22/2024 3.1 (L)  3.5 - 5.0 g/dL Final   AST 91/89/7974 23  15 - 41 U/L Final   ALT 02/22/2024 17  0 - 44 U/L Final   Alkaline Phosphatase 02/22/2024 81  38 - 126 U/L Final   Total Bilirubin 02/22/2024 1.0  0.0 - 1.2 mg/dL Final   Bilirubin, Direct 02/22/2024 0.2  0.0 - 0.2 mg/dL Final   Indirect Bilirubin 02/22/2024 0.8  0.3 - 0.9 mg/dL Final   Weight 91/88/7974 2,532.64  oz Final   Height 02/23/2024 70  in Final   BP 02/23/2024 122/68  mmHg Final   S' Lateral 02/23/2024 3.17  cm Final   AR max vel 02/23/2024 0.85  cm2 Final   AV Area VTI 02/23/2024 0.89  cm2 Final   AV Mean grad 02/23/2024 35.5  mmHg Final   AV Peak grad 02/23/2024 60.5  mmHg Final   Ao pk vel 02/23/2024 3.89  m/s Final   AV Area mean vel 02/23/2024 0.95  cm2 Final   Est EF 02/23/2024 60 - 65%   Final   WBC 02/23/2024 4.9  4.0 - 10.5 K/uL Final   RBC 02/23/2024 3.80 (L)   4.22 - 5.81 MIL/uL Final   Hemoglobin 02/23/2024 11.9 (L)  13.0 - 17.0 g/dL Final   HCT 91/88/7974 34.5 (L)  39.0 - 52.0 % Final   MCV 02/23/2024 90.8  80.0 - 100.0 fL Final   MCH 02/23/2024 31.3  26.0 - 34.0 pg Final   MCHC 02/23/2024 34.5  30.0 - 36.0 g/dL Final   RDW 91/88/7974 13.6  11.5 - 15.5 % Final   Platelets 02/23/2024 160  150 - 400 K/uL Final   nRBC 02/23/2024 0.0  0.0 - 0.2 % Final   Sodium 02/23/2024 140  135 - 145 mmol/L Final   Potassium 02/23/2024 3.5  3.5 - 5.1 mmol/L  Final   Chloride 02/23/2024 102  98 - 111 mmol/L Final   CO2 02/23/2024 27  22 - 32 mmol/L Final   Glucose, Bld 02/23/2024 105 (H)  70 - 99 mg/dL Final   BUN 91/88/7974 32 (H)  8 - 23 mg/dL Final   Creatinine, Ser 02/23/2024 2.05 (H)  0.61 - 1.24 mg/dL Final   Calcium  02/23/2024 8.7 (L)  8.9 - 10.3 mg/dL Final   GFR, Estimated 02/23/2024 31 (L)  >60 mL/min Final   Anion gap 02/23/2024 11  5 - 15 Final   Magnesium  02/23/2024 1.8  1.7 - 2.4 mg/dL Final   WBC 91/87/7974 5.1  4.0 - 10.5 K/uL Final   RBC 02/24/2024 3.88 (L)  4.22 - 5.81 MIL/uL Final   Hemoglobin 02/24/2024 12.5 (L)  13.0 - 17.0 g/dL Final   HCT 91/87/7974 35.9 (L)  39.0 - 52.0 % Final   MCV 02/24/2024 92.5  80.0 - 100.0 fL Final   MCH 02/24/2024 32.2  26.0 - 34.0 pg Final   MCHC 02/24/2024 34.8  30.0 - 36.0 g/dL Final   RDW 91/87/7974 13.7  11.5 - 15.5 % Final   Platelets 02/24/2024 152  150 - 400 K/uL Final   nRBC 02/24/2024 0.0  0.0 - 0.2 % Final   Sodium 02/24/2024 139  135 - 145 mmol/L Final   Potassium 02/24/2024 4.5  3.5 - 5.1 mmol/L Final   Chloride 02/24/2024 105  98 - 111 mmol/L Final   CO2 02/24/2024 25  22 - 32 mmol/L Final   Glucose, Bld 02/24/2024 101 (H)  70 - 99 mg/dL Final   BUN 91/87/7974 29 (H)  8 - 23 mg/dL Final   Creatinine, Ser 02/24/2024 1.60 (H)  0.61 - 1.24 mg/dL Final   Calcium  02/24/2024 8.7 (L)  8.9 - 10.3 mg/dL Final   GFR, Estimated 02/24/2024 41 (L)  >60 mL/min Final   Anion gap 02/24/2024 9  5 - 15  Final   Magnesium  02/24/2024 2.0  1.7 - 2.4 mg/dL Final  Admission on 92/94/7974, Discharged on 01/22/2024  Component Date Value Ref Range Status   Sodium 01/17/2024 142  135 - 145 mmol/L Final   Potassium 01/17/2024 3.5  3.5 - 5.1 mmol/L Final   Chloride 01/17/2024 105  98 - 111 mmol/L Final   CO2 01/17/2024 28  22 - 32 mmol/L Final   Glucose, Bld 01/17/2024 109 (H)  70 - 99 mg/dL Final   BUN 92/94/7974 16  8 - 23 mg/dL Final   Creatinine, Ser 01/17/2024 1.41 (H)  0.61 - 1.24 mg/dL Final   Calcium  01/17/2024 8.8 (L)  8.9 - 10.3 mg/dL Final   Total Protein 92/94/7974 6.8  6.5 - 8.1 g/dL Final   Albumin  01/17/2024 3.1 (L)  3.5 - 5.0 g/dL Final   AST 92/94/7974 30  15 - 41 U/L Final   ALT 01/17/2024 22  0 - 44 U/L Final   Alkaline Phosphatase 01/17/2024 86  38 - 126 U/L Final   Total Bilirubin 01/17/2024 0.6  0.0 - 1.2 mg/dL Final   GFR, Estimated 01/17/2024 48 (L)  >60 mL/min Final   Anion gap 01/17/2024 9  5 - 15 Final   Sodium 01/17/2024 144  135 - 145 mmol/L Final   Potassium 01/17/2024 3.5  3.5 - 5.1 mmol/L Final   Chloride 01/17/2024 104  98 - 111 mmol/L Final   BUN 01/17/2024 18  8 - 23 mg/dL Final   Creatinine, Ser 01/17/2024 1.40 (H)  0.61 -  1.24 mg/dL Final   Glucose, Bld 92/94/7974 104 (H)  70 - 99 mg/dL Final   Calcium , Ion 01/17/2024 1.08 (L)  1.15 - 1.40 mmol/L Final   TCO2 01/17/2024 26  22 - 32 mmol/L Final   Hemoglobin 01/17/2024 13.6  13.0 - 17.0 g/dL Final   HCT 92/94/7974 40.0  39.0 - 52.0 % Final   WBC 01/17/2024 6.4  4.0 - 10.5 K/uL Final   RBC 01/17/2024 4.29  4.22 - 5.81 MIL/uL Final   Hemoglobin 01/17/2024 14.2  13.0 - 17.0 g/dL Final   HCT 92/94/7974 39.2  39.0 - 52.0 % Final   MCV 01/17/2024 91.4  80.0 - 100.0 fL Final   MCH 01/17/2024 33.1  26.0 - 34.0 pg Final   MCHC 01/17/2024 36.2 (H)  30.0 - 36.0 g/dL Final   RDW 92/94/7974 13.2  11.5 - 15.5 % Final   Platelets 01/17/2024 152  150 - 400 K/uL Final   nRBC 01/17/2024 0.0  0.0 - 0.2 % Final    Alcohol , Ethyl (B) 01/17/2024 <15  <15 mg/dL Final   Color, Urine 92/94/7974 YELLOW  YELLOW Final   APPearance 01/17/2024 CLEAR  CLEAR Final   Specific Gravity, Urine 01/17/2024 1.015  1.005 - 1.030 Final   pH 01/17/2024 6.0  5.0 - 8.0 Final   Glucose, UA 01/17/2024 NEGATIVE  NEGATIVE mg/dL Final   Hgb urine dipstick 01/17/2024 SMALL (A)  NEGATIVE Final   Bilirubin Urine 01/17/2024 NEGATIVE  NEGATIVE Final   Ketones, ur 01/17/2024 NEGATIVE  NEGATIVE mg/dL Final   Protein, ur 92/94/7974 100 (A)  NEGATIVE mg/dL Final   Nitrite 92/94/7974 NEGATIVE  NEGATIVE Final   Leukocytes,Ua 01/17/2024 NEGATIVE  NEGATIVE Final   RBC / HPF 01/17/2024 21-50  0 - 5 RBC/hpf Final   WBC, UA 01/17/2024 0-5  0 - 5 WBC/hpf Final   Bacteria, UA 01/17/2024 RARE (A)  NONE SEEN Final   Squamous Epithelial / HPF 01/17/2024 0-5  0 - 5 /HPF Final   Mucus 01/17/2024 PRESENT   Final   Lactic Acid, Venous 01/17/2024 1.4  0.5 - 1.9 mmol/L Final   Prothrombin  Time 01/17/2024 18.0 (H)  11.4 - 15.2 seconds Final   INR 01/17/2024 1.4 (H)  0.8 - 1.2 Final   Blood Bank Specimen 01/17/2024 SAMPLE AVAILABLE FOR TESTING   Final   Sample Expiration 01/17/2024    Final                   Value:01/20/2024,2359 Performed at [facility], [address] [place], [place] [address]    Troponin I (High Sensitivity) 01/17/2024 24 (H)  <18 ng/L Final   Troponin I (High Sensitivity) 01/17/2024 22 (H)  <18 ng/L Final   Sodium 01/18/2024 140  135 - 145 mmol/L Final   Potassium 01/18/2024 3.2 (L)  3.5 - 5.1 mmol/L Final   Chloride 01/18/2024 105  98 - 111 mmol/L Final   CO2 01/18/2024 26  22 - 32 mmol/L Final   Glucose, Bld 01/18/2024 180 (H)  70 - 99 mg/dL Final   BUN 92/93/7974 17  8 - 23 mg/dL Final   Creatinine, Ser 01/18/2024 1.21  0.61 - 1.24 mg/dL Final   Calcium  01/18/2024 8.6 (L)  8.9 - 10.3 mg/dL Final   GFR, Estimated 01/18/2024 58 (L)  >60 mL/min Final   Anion gap 01/18/2024 9  5 - 15 Final   WBC 01/18/2024 5.6  4.0 -  10.5 K/uL Final   RBC 01/18/2024 4.21 (L)  4.22 - 5.81 MIL/uL Final   Hemoglobin 01/18/2024 13.4  13.0 - 17.0 g/dL Final   HCT 92/93/7974 38.6 (L)  39.0 - 52.0 % Final   MCV 01/18/2024 91.7  80.0 - 100.0 fL Final   MCH 01/18/2024 31.8  26.0 - 34.0 pg Final   MCHC 01/18/2024 34.7  30.0 - 36.0 g/dL Final   RDW 92/93/7974 13.3  11.5 - 15.5 % Final   Platelets 01/18/2024 143 (L)  150 - 400 K/uL Final   nRBC 01/18/2024 0.0  0.0 - 0.2 % Final   Magnesium  01/18/2024 1.9  1.7 - 2.4 mg/dL Final   Phosphorus 92/93/7974 3.5  2.5 - 4.6 mg/dL Final   Weight 92/93/7974 2,426.82  oz Final   Height 01/18/2024 70  in Final   BP 01/18/2024 157/84  mmHg Final   S' Lateral 01/18/2024 3.70  cm Final   AR max vel 01/18/2024 1.15  cm2 Final   AV Area VTI 01/18/2024 1.12  cm2 Final   AV Mean grad 01/18/2024 20.0  mmHg Final   AV Peak grad 01/18/2024 37.1  mmHg Final   Ao pk vel 01/18/2024 3.05  m/s Final   AV Area mean vel 01/18/2024 1.16  cm2 Final   Est EF 01/18/2024 50 - 55%   Final   TSH 01/19/2024 6.968 (H)  0.350 - 4.500 uIU/mL Final   Vitamin B-12 01/19/2024 352  180 - 914 pg/mL Final   Sodium 01/19/2024 137  135 - 145 mmol/L Final   Potassium 01/19/2024 3.5  3.5 - 5.1 mmol/L Final   Chloride 01/19/2024 103  98 - 111 mmol/L Final   CO2 01/19/2024 22  22 - 32 mmol/L Final   Glucose, Bld 01/19/2024 105 (H)  70 - 99 mg/dL Final   BUN 92/92/7974 12  8 - 23 mg/dL Final   Creatinine, Ser 01/19/2024 1.19  0.61 - 1.24 mg/dL Final   Calcium  01/19/2024 9.1  8.9 - 10.3 mg/dL Final   GFR, Estimated 01/19/2024 59 (L)  >60 mL/min Final   Anion gap 01/19/2024 12  5 - 15 Final   WBC 01/19/2024 8.2  4.0 - 10.5 K/uL Final   RBC 01/19/2024 4.57  4.22 - 5.81 MIL/uL Final   Hemoglobin 01/19/2024 14.6  13.0 - 17.0 g/dL Final   HCT 92/92/7974 42.0  39.0 - 52.0 % Final   MCV 01/19/2024 91.9  80.0 - 100.0 fL Final   MCH 01/19/2024 31.9  26.0 - 34.0 pg Final   MCHC 01/19/2024 34.8  30.0 - 36.0 g/dL Final   RDW  92/92/7974 13.2  11.5 - 15.5 % Final   Platelets 01/19/2024 171  150 - 400 K/uL Final   nRBC 01/19/2024 0.0  0.0 - 0.2 % Final   Neutrophils Relative % 01/19/2024 48  % Final   Neutro Abs 01/19/2024 4.0  1.7 - 7.7 K/uL Final   Lymphocytes Relative 01/19/2024 39  % Final   Lymphs Abs 01/19/2024 3.2  0.7 - 4.0 K/uL Final   Monocytes Relative 01/19/2024 9  % Final   Monocytes Absolute 01/19/2024 0.7  0.1 - 1.0 K/uL Final   Eosinophils Relative 01/19/2024 2  % Final   Eosinophils Absolute 01/19/2024 0.2  0.0 - 0.5 K/uL Final   Basophils Relative 01/19/2024 1  % Final   Basophils Absolute 01/19/2024 0.1  0.0 - 0.1 K/uL Final   Immature Granulocytes 01/19/2024 1  % Final   Abs Immature Granulocytes 01/19/2024 0.04  0.00 - 0.07 K/uL Final  Magnesium  01/19/2024 1.8  1.7 - 2.4 mg/dL Final   Free T4 92/91/7974 1.22 (H)  0.61 - 1.12 ng/dL Final   T3, Total 92/91/7974 135  71 - 180 ng/dL Final   Sodium 92/91/7974 138  135 - 145 mmol/L Final   Potassium 01/20/2024 3.9  3.5 - 5.1 mmol/L Final   Chloride 01/20/2024 104  98 - 111 mmol/L Final   CO2 01/20/2024 24  22 - 32 mmol/L Final   Glucose, Bld 01/20/2024 108 (H)  70 - 99 mg/dL Final   BUN 92/91/7974 17  8 - 23 mg/dL Final   Creatinine, Ser 01/20/2024 1.34 (H)  0.61 - 1.24 mg/dL Final   Calcium  01/20/2024 8.8 (L)  8.9 - 10.3 mg/dL Final   GFR, Estimated 01/20/2024 51 (L)  >60 mL/min Final   Anion gap 01/20/2024 10  5 - 15 Final   WBC 01/20/2024 6.3  4.0 - 10.5 K/uL Final   RBC 01/20/2024 4.41  4.22 - 5.81 MIL/uL Final   Hemoglobin 01/20/2024 13.9  13.0 - 17.0 g/dL Final   HCT 92/91/7974 39.9  39.0 - 52.0 % Final   MCV 01/20/2024 90.5  80.0 - 100.0 fL Final   MCH 01/20/2024 31.5  26.0 - 34.0 pg Final   MCHC 01/20/2024 34.8  30.0 - 36.0 g/dL Final   RDW 92/91/7974 13.2  11.5 - 15.5 % Final   Platelets 01/20/2024 156  150 - 400 K/uL Final   nRBC 01/20/2024 0.0  0.0 - 0.2 % Final   Neutrophils Relative % 01/20/2024 63  % Final   Neutro Abs  01/20/2024 4.1  1.7 - 7.7 K/uL Final   Lymphocytes Relative 01/20/2024 24  % Final   Lymphs Abs 01/20/2024 1.5  0.7 - 4.0 K/uL Final   Monocytes Relative 01/20/2024 8  % Final   Monocytes Absolute 01/20/2024 0.5  0.1 - 1.0 K/uL Final   Eosinophils Relative 01/20/2024 3  % Final   Eosinophils Absolute 01/20/2024 0.2  0.0 - 0.5 K/uL Final   Basophils Relative 01/20/2024 1  % Final   Basophils Absolute 01/20/2024 0.1  0.0 - 0.1 K/uL Final   Immature Granulocytes 01/20/2024 1  % Final   Abs Immature Granulocytes 01/20/2024 0.03  0.00 - 0.07 K/uL Final   Magnesium  01/20/2024 2.0  1.7 - 2.4 mg/dL Final   Sodium 92/90/7974 136  135 - 145 mmol/L Final   Potassium 01/21/2024 3.9  3.5 - 5.1 mmol/L Final   Chloride 01/21/2024 104  98 - 111 mmol/L Final   CO2 01/21/2024 27  22 - 32 mmol/L Final   Glucose, Bld 01/21/2024 97  70 - 99 mg/dL Final   BUN 92/90/7974 23  8 - 23 mg/dL Final   Creatinine, Ser 01/21/2024 1.61 (H)  0.61 - 1.24 mg/dL Final   Calcium  01/21/2024 8.4 (L)  8.9 - 10.3 mg/dL Final   GFR, Estimated 01/21/2024 41 (L)  >60 mL/min Final   Anion gap 01/21/2024 5  5 - 15 Final   Sodium 01/22/2024 138  135 - 145 mmol/L Final   Potassium 01/22/2024 3.7  3.5 - 5.1 mmol/L Final   Chloride 01/22/2024 105  98 - 111 mmol/L Final   CO2 01/22/2024 27  22 - 32 mmol/L Final   Glucose, Bld 01/22/2024 99  70 - 99 mg/dL Final   BUN 92/89/7974 19  8 - 23 mg/dL Final   Creatinine, Ser 01/22/2024 1.25 (H)  0.61 - 1.24 mg/dL Final   Calcium  01/22/2024 8.8 (L)  8.9 - 10.3 mg/dL Final   GFR, Estimated 01/22/2024 55 (L)  >60 mL/min Final   Anion gap 01/22/2024 6  5 - 15 Final  Admission on 01/16/2024, Discharged on 01/16/2024  Component Date Value Ref Range Status   WBC 01/16/2024 5.6  4.0 - 10.5 K/uL Final   RBC 01/16/2024 4.40  4.22 - 5.81 MIL/uL Final   Hemoglobin 01/16/2024 14.1  13.0 - 17.0 g/dL Final   HCT 92/95/7974 39.9  39.0 - 52.0 % Final   MCV 01/16/2024 90.7  80.0 - 100.0 fL Final   MCH  01/16/2024 32.0  26.0 - 34.0 pg Final   MCHC 01/16/2024 35.3  30.0 - 36.0 g/dL Final   RDW 92/95/7974 13.0  11.5 - 15.5 % Final   Platelets 01/16/2024 145 (L)  150 - 400 K/uL Final   nRBC 01/16/2024 0.0  0.0 - 0.2 % Final   Neutrophils Relative % 01/16/2024 61  % Final   Neutro Abs 01/16/2024 3.5  1.7 - 7.7 K/uL Final   Lymphocytes Relative 01/16/2024 26  % Final   Lymphs Abs 01/16/2024 1.5  0.7 - 4.0 K/uL Final   Monocytes Relative 01/16/2024 8  % Final   Monocytes Absolute 01/16/2024 0.4  0.1 - 1.0 K/uL Final   Eosinophils Relative 01/16/2024 3  % Final   Eosinophils Absolute 01/16/2024 0.2  0.0 - 0.5 K/uL Final   Basophils Relative 01/16/2024 1  % Final   Basophils Absolute 01/16/2024 0.0  0.0 - 0.1 K/uL Final   Immature Granulocytes 01/16/2024 1  % Final   Abs Immature Granulocytes 01/16/2024 0.03  0.00 - 0.07 K/uL Final  Office Visit on 11/12/2023  Component Date Value Ref Range Status   Sodium 11/12/2023 138  135 - 145 mEq/L Final   Potassium 11/12/2023 3.6  3.5 - 5.1 mEq/L Final   Chloride 11/12/2023 101  96 - 112 mEq/L Final   CO2 11/12/2023 30  19 - 32 mEq/L Final   Glucose, Bld 11/12/2023 161 (H)  70 - 99 mg/dL Final   BUN 95/69/7974 18  6 - 23 mg/dL Final   Creatinine, Ser 11/12/2023 1.34  0.40 - 1.50 mg/dL Final   Total Bilirubin 11/12/2023 0.9  0.2 - 1.2 mg/dL Final   Alkaline Phosphatase 11/12/2023 92  39 - 117 U/L Final   AST 11/12/2023 23  0 - 37 U/L Final   ALT 11/12/2023 17  0 - 53 U/L Final   Total Protein 11/12/2023 7.2  6.0 - 8.3 g/dL Final   Albumin  11/12/2023 4.0  3.5 - 5.2 g/dL Final   GFR 95/69/7974 47.48 (L)  >60.00 mL/min Final   Calcium  11/12/2023 8.9  8.4 - 10.5 mg/dL Final   Hgb J8r MFr Bld 11/12/2023 6.2  4.6 - 6.5 % Final   Microalb, Ur 11/12/2023 75.9 (H)  0.0 - 1.9 mg/dL Final   Creatinine,U 95/69/7974 65.4  mg/dL Final   Microalb Creat Ratio 11/12/2023 1,160.3 (H)  0.0 - 30.0 mg/g Final   Direct LDL 11/12/2023 87.0  mg/dL Final  Appointment on  07/07/2023  Component Date Value Ref Range Status   S' Lateral 07/07/2023 2.46  cm Final   AV Area VTI 07/07/2023 1.10  cm2 Final   AV Mean grad 07/07/2023 16.0  mmHg Final   AV Area mean vel 07/07/2023 1.06  cm2 Final   Area-P 1/2 07/07/2023 3.31  cm2 Final   AR max vel 07/07/2023 1.15  cm2 Final   AV Peak grad 07/07/2023 30.5  mmHg Final   Ao pk vel 07/07/2023 2.76  m/s Final   MV M vel 07/07/2023 5.68  m/s Final   MV Peak grad 07/07/2023 129.0  mmHg Final   P 1/2 time 07/07/2023 459  msec Final   Est EF 07/07/2023 60 - 65%   Final  Office Visit on 06/11/2023  Component Date Value Ref Range Status   Hgb A1c MFr Bld 06/11/2023 6.2  4.6 - 6.5 % Final   Sodium 06/11/2023 139  135 - 145 mEq/L Final   Potassium 06/11/2023 4.0  3.5 - 5.1 mEq/L Final   Chloride 06/11/2023 105  96 - 112 mEq/L Final   CO2 06/11/2023 26  19 - 32 mEq/L Final   Glucose, Bld 06/11/2023 109 (H)  70 - 99 mg/dL Final   BUN 88/72/7975 21  6 - 23 mg/dL Final   Creatinine, Ser 06/11/2023 1.38  0.40 - 1.50 mg/dL Final   Total Bilirubin 06/11/2023 0.7  0.2 - 1.2 mg/dL Final   Alkaline Phosphatase 06/11/2023 104  39 - 117 U/L Final   AST 06/11/2023 21  0 - 37 U/L Final   ALT 06/11/2023 16  0 - 53 U/L Final   Total Protein 06/11/2023 7.0  6.0 - 8.3 g/dL Final   Albumin  06/11/2023 3.9  3.5 - 5.2 g/dL Final   GFR 88/72/7975 45.97 (L)  >60.00 mL/min Final   Calcium  06/11/2023 8.6  8.4 - 10.5 mg/dL Final   WBC 88/72/7975 5.6  4.0 - 10.5 K/uL Final   RBC 06/11/2023 4.40  4.22 - 5.81 Mil/uL Final   Hemoglobin 06/11/2023 14.4  13.0 - 17.0 g/dL Final   HCT 88/72/7975 42.4  39.0 - 52.0 % Final   MCV 06/11/2023 96.4  78.0 - 100.0 fl Final   MCHC 06/11/2023 33.9  30.0 - 36.0 g/dL Final   RDW 88/72/7975 14.4  11.5 - 15.5 % Final   Platelets 06/11/2023 181.0  150.0 - 400.0 K/uL Final   Neutrophils Relative % 06/11/2023 59.6  43.0 - 77.0 % Final   Lymphocytes Relative 06/11/2023 29.6  12.0 - 46.0 % Final   Monocytes Relative  06/11/2023 7.1  3.0 - 12.0 % Final   Eosinophils Relative 06/11/2023 2.4  0.0 - 5.0 % Final   Basophils Relative 06/11/2023 1.3  0.0 - 3.0 % Final   Neutro Abs 06/11/2023 3.3  1.4 - 7.7 K/uL Final   Lymphs Abs 06/11/2023 1.6  0.7 - 4.0 K/uL Final   Monocytes Absolute 06/11/2023 0.4  0.1 - 1.0 K/uL Final   Eosinophils Absolute 06/11/2023 0.1  0.0 - 0.7 K/uL Final   Basophils Absolute 06/11/2023 0.1  0.0 - 0.1 K/uL Final   Cholesterol 06/11/2023 137  0 - 200 mg/dL Final   Triglycerides 88/72/7975 84.0  0.0 - 149.0 mg/dL Final   HDL 88/72/7975 42.40  >39.00 mg/dL Final   VLDL 88/72/7975 16.8  0.0 - 40.0 mg/dL Final   LDL Cholesterol 06/11/2023 78  0 - 99 mg/dL Final   Total CHOL/HDL Ratio 06/11/2023 3   Final   NonHDL 06/11/2023 94.60   Final   PSA 06/11/2023 0.01 (L)  0.10 - 4.00 ng/ml Final  Admission on 03/23/2023, Discharged on 03/23/2023  Component Date Value Ref Range Status   SARS Coronavirus 2 by RT PCR 03/23/2023 POSITIVE (A)  NEGATIVE Final  Admission on 12/04/2022, Discharged on 12/04/2022  Component Date Value Ref Range Status   Glucose-Capillary 12/04/2022 144 (H)  70 - 99 mg/dL Final   Sodium  12/04/2022 138  135 - 145 mmol/L Final   Potassium 12/04/2022 3.9  3.5 - 5.1 mmol/L Final   Chloride 12/04/2022 104  98 - 111 mmol/L Final   CO2 12/04/2022 26  22 - 32 mmol/L Final   Glucose, Bld 12/04/2022 181 (H)  70 - 99 mg/dL Final   BUN 94/77/7975 22  8 - 23 mg/dL Final   Creatinine, Ser 12/04/2022 1.40 (H)  0.61 - 1.24 mg/dL Final   Calcium  12/04/2022 9.3  8.9 - 10.3 mg/dL Final   Total Protein 94/77/7975 7.6  6.5 - 8.1 g/dL Final   Albumin  12/04/2022 4.2  3.5 - 5.0 g/dL Final   AST 94/77/7975 22  15 - 41 U/L Final   ALT 12/04/2022 17  0 - 44 U/L Final   Alkaline Phosphatase 12/04/2022 123  38 - 126 U/L Final   Total Bilirubin 12/04/2022 0.8  0.3 - 1.2 mg/dL Final   GFR, Estimated 12/04/2022 49 (L)  >60 mL/min Final   Anion gap 12/04/2022 8  5 - 15 Final   WBC 12/04/2022  5.5  4.0 - 10.5 K/uL Final   RBC 12/04/2022 4.72  4.22 - 5.81 MIL/uL Final   Hemoglobin 12/04/2022 15.0  13.0 - 17.0 g/dL Final   HCT 94/77/7975 43.2  39.0 - 52.0 % Final   MCV 12/04/2022 91.5  80.0 - 100.0 fL Final   MCH 12/04/2022 31.8  26.0 - 34.0 pg Final   MCHC 12/04/2022 34.7  30.0 - 36.0 g/dL Final   RDW 94/77/7975 13.5  11.5 - 15.5 % Final   Platelets 12/04/2022 148 (L)  150 - 400 K/uL Final   nRBC 12/04/2022 0.0  0.0 - 0.2 % Final   Neutrophils Relative % 12/04/2022 56  % Final   Neutro Abs 12/04/2022 3.1  1.7 - 7.7 K/uL Final   Lymphocytes Relative 12/04/2022 32  % Final   Lymphs Abs 12/04/2022 1.8  0.7 - 4.0 K/uL Final   Monocytes Relative 12/04/2022 8  % Final   Monocytes Absolute 12/04/2022 0.4  0.1 - 1.0 K/uL Final   Eosinophils Relative 12/04/2022 2  % Final   Eosinophils Absolute 12/04/2022 0.1  0.0 - 0.5 K/uL Final   Basophils Relative 12/04/2022 1  % Final   Basophils Absolute 12/04/2022 0.1  0.0 - 0.1 K/uL Final   Immature Granulocytes 12/04/2022 1  % Final   Abs Immature Granulocytes 12/04/2022 0.03  0.00 - 0.07 K/uL Final   Color, Urine 12/04/2022 COLORLESS (A)  YELLOW Final   APPearance 12/04/2022 CLEAR  CLEAR Final   Specific Gravity, Urine 12/04/2022 1.009  1.005 - 1.030 Final   pH 12/04/2022 6.0  5.0 - 8.0 Final   Glucose, UA 12/04/2022 NEGATIVE  NEGATIVE mg/dL Final   Hgb urine dipstick 12/04/2022 NEGATIVE  NEGATIVE Final   Bilirubin Urine 12/04/2022 NEGATIVE  NEGATIVE Final   Ketones, ur 12/04/2022 NEGATIVE  NEGATIVE mg/dL Final   Protein, ur 94/77/7975 30 (A)  NEGATIVE mg/dL Final   Nitrite 94/77/7975 NEGATIVE  NEGATIVE Final   Leukocytes,Ua 12/04/2022 NEGATIVE  NEGATIVE Final   RBC / HPF 12/04/2022 0-5  0 - 5 RBC/hpf Final   WBC, UA 12/04/2022 0-5  0 - 5 WBC/hpf Final   Bacteria, UA 12/04/2022 NONE SEEN  NONE SEEN Final   Squamous Epithelial / HPF 12/04/2022 0-5  0 - 5 /HPF Final   Hyaline Casts, UA 12/04/2022 PRESENT   Final  Office Visit on  11/28/2022  Component Date Value Ref Range Status  Hgb A1c MFr Bld 11/28/2022 6.8 (H)  4.6 - 6.5 % Final   Sodium 11/28/2022 141  135 - 145 mEq/L Final   Potassium 11/28/2022 4.1  3.5 - 5.1 mEq/L Final   Chloride 11/28/2022 103  96 - 112 mEq/L Final   CO2 11/28/2022 30  19 - 32 mEq/L Final   Glucose, Bld 11/28/2022 157 (H)  70 - 99 mg/dL Final   BUN 94/83/7975 23  6 - 23 mg/dL Final   Creatinine, Ser 11/28/2022 1.32  0.40 - 1.50 mg/dL Final   Total Bilirubin 11/28/2022 0.8  0.2 - 1.2 mg/dL Final   Alkaline Phosphatase 11/28/2022 95  39 - 117 U/L Final   AST 11/28/2022 22  0 - 37 U/L Final   ALT 11/28/2022 17  0 - 53 U/L Final   Total Protein 11/28/2022 7.1  6.0 - 8.3 g/dL Final   Albumin  11/28/2022 3.9  3.5 - 5.2 g/dL Final   GFR 94/83/7975 48.67 (L)  >60.00 mL/min Final   Calcium  11/28/2022 9.2  8.4 - 10.5 mg/dL Final   Vitamin A-87 94/83/7975 >1500 (H)  211 - 911 pg/mL Final   PSA 11/28/2022 0.01 (L)  0.10 - 4.00 ng/mL Final  There may be more visits with results that are not included.  No image results found. CT CARDIAC MORPH/PULM VEIN W/CM&W/O CA SCORE Result Date: 03/01/2024 CLINICAL DATA:  Atrial fibrillation scheduled for Watchman procedure EXAM: Cardiac CT/CTA TECHNIQUE: A non-contrast, gated CT scan was obtained with axial slices of 2.5 mm through the heart for calcium  scoring. Calcium  scoring was performed using the Agatston method. A 120 kV prospective, gated, contrast cardiac scan was obtained. Gantry rotation speed was 230 msec Calvin collimation was 0.63 mm. Nitroglycerin was not given. A delayed scan was obtained to exclude left atrial appendage thrombus. The 3D dataset was reconstructed in systole with motion correction. The 3D dataset was reconstructed at 5% intervals of the 25%-50% of the R-R cycle. Images were analyzed on a dedicated workstation using MPR, MIP, Calvin VRT modes. The patient received 95 cc of contrast. FINDINGS: Left atrium: Mild enlargement. Filling defect in  left atrial appendage on initial images that resolves on delayed imaging, suggesting no thrombus in the left atrial appendage. Landing zone measures 25mm x 19mm in diameter with depth 16mm, suitable for 31mm FLX device Left ventricle: Normal in size. Right atrium: Severe enlargement. PFO Right ventricle:  Normal in size. Pericardium: Normal thickness Pulmonary veins: Normal configuration Pulmonary arteries: Dilated main pulmonary artery measuring 31mm Aorta: Dilated ascending aorta measuring 41mm Coronary Arteries: Normal coronary origin. Right dominance. The study was performed without use of NTG Calvin insufficient for plaque evaluation. Coronary calcium  score 266 (percentile not available for age over 39) Valves: Moderate aortic valve calcifications (AV calcium  score 1739) IMPRESSION: 1. Left atrial appendage landing zone measures 25mm x 19mm in diameter with depth 16mm, suitable for 31mm FLX device 2. Filling defect in left atrial appendage on initial images that resolves on delayed imaging, suggesting no thrombus in the left atrial appendage 3.  PFO 4.  Dilated main pulmonary artery measuring 31mm 5.  Dilated ascending aorta measuring 41mm 6.  Moderate aortic valve calcifications (AV calcium  score 1739) 7. Coronary calcium  score 266 (percentile not available for age over 29) Electronically Signed   By: Lonni Nanas M.D.   On: 03/01/2024 17:34   ECHOCARDIOGRAM LIMITED Result Date: 02/23/2024    ECHOCARDIOGRAM LIMITED REPORT   Patient Name:   Calvin Burnett Date of Exam:  02/23/2024 Medical Rec #:  [id number]       Height:       70.0 in Accession #:    [id number]      Weight:       158.3 lb Date of Birth:  29-May-1934       BSA:          1.890 m Patient Age:    89 years        BP:           122/68 mmHg Patient Gender: M               HR:           56 bpm. Exam Location:  Inpatient Procedure: Limited Echo, Cardiac Doppler Calvin Color Doppler (Both Spectral Calvin            Color Flow Doppler were utilized during  procedure). Indications:    Aortic stenosis I35.0  History:        Patient has prior history of Echocardiogram examinations, most                 recent 01/18/2024. Aortic Valve Disease; Risk Factors:Hypertension                 Calvin Diabetes.  Sonographer:    Jayson Gaskins Referring Phys: [id number] SHENG L HALEY IMPRESSIONS  1. LIMITED ECHOCARDIOGRAM.  2. Left ventricular ejection fraction, by estimation, is 60 to 65%. The left ventricle has normal function.  3. Left atrial size was severely dilated.  4. The mitral valve is degenerative. Mild to moderate mitral valve regurgitation. No evidence of mitral stenosis.  5. Tricuspid valve regurgitation is mild to moderate.  6. Native aortic valve, severely calcified, severely reduced leaflet excursion, trivial aortic regurgitation, moderate to severe aortic stenosis (peak velocity 3.98 m/s, mean gradient 37 mmHg, dimensional index 0.30, AVA per VTI 0.86 cm).  7. The inferior vena cava is normal in size with greater than 50% respiratory variability, suggesting right atrial pressure of 3 mmHg. Comparison(s): A prior study was performed on 01/18/2024. Reported moderate to severe AS (peak velocity 3.81m/s, MG , AVA 1.0cm2, DI 0.3). FINDINGS  Left Ventricle: Left ventricular ejection fraction, by estimation, is 60 to 65%. The left ventricle has normal function. The left ventricular internal cavity size was normal in size. There is no left ventricular hypertrophy. Left Atrium: Left atrial size was severely dilated. Mitral Valve: The mitral valve is degenerative in appearance. There is mild thickening of the mitral valve leaflet(s). Normal mobility of the mitral valve leaflets. Mild mitral annular calcification. Mild to moderate mitral valve regurgitation. No evidence of mitral valve stenosis. Tricuspid Valve: The tricuspid valve is grossly normal. Tricuspid valve regurgitation is mild to moderate. Aortic Valve: Native aortic valve, severely calcified, severely reduced leaflet  excursion, trivial aortic regurgitation, moderate to severe aortic stenosis (peak velocity 3.98 m/s, mean gradient 37 mmHg, dimensional index 0.30, AVA per VTI 0.86 cm). There is severe aortic valve annular calcification. Aortic valve mean gradient measures 35.5 mmHg. Aortic valve peak gradient measures 60.5 mmHg. Aortic valve area, by VTI measures 0.89 cm. Pulmonic Valve: The pulmonic valve was not well visualized. Aorta: The aortic root is normal in size Calvin structure. Venous: The inferior vena cava is normal in size with greater than 50% respiratory variability, suggesting right atrial pressure of 3 mmHg. Additional Comments: LIMITED ECHOCARDIOGRAM.  LEFT VENTRICLE PLAX 2D LVIDd:         5.06 cm LVIDs:  3.17 cm LV PW:         0.84 cm LV IVS:        0.85 cm LVOT diam:     1.96 cm LV SV:         82 LV SV Index:   43 LVOT Area:     3.02 cm  LEFT ATRIUM              Index LA Vol (A2C):   98.0 ml  51.86 ml/m LA Vol (A4C):   99.3 ml  52.54 ml/m LA Biplane Vol: 102.0 ml 53.97 ml/m  AORTIC VALVE AV Area (Vmax):    0.85 cm AV Area (Vmean):   0.95 cm AV Area (VTI):     0.89 cm AV Vmax:           389.00 cm/s AV Vmean:          285.500 cm/s AV VTI:            0.915 m AV Peak Grad:      60.5 mmHg AV Mean Grad:      35.5 mmHg LVOT Vmax:         109.00 cm/s LVOT Vmean:        89.900 cm/s LVOT VTI:          0.271 m LVOT/AV VTI ratio: 0.30  AORTA Ao Root diam: 3.21 cm  SHUNTS Systemic VTI:  0.27 m Systemic Diam: 1.96 cm Sunit Burnett Electronically signed by Madonna Large Signature Date/Time: 02/23/2024/5:29:54 PM    Final    CT Head Wo Contrast Result Date: 02/21/2024 CLINICAL DATA:  Headache, increasing frequency or severity EXAM: CT HEAD WITHOUT CONTRAST TECHNIQUE: Contiguous axial images were obtained from the base of the skull through the vertex without intravenous contrast. RADIATION DOSE REDUCTION: This exam was performed according to the departmental dose-optimization program which includes automated exposure  control, adjustment of the mA Calvin/or kV according to patient size Calvin/or use of iterative reconstruction technique. COMPARISON:  Brain MRI Calvin head CT a 01/18/2024 FINDINGS: Brain: Subcentimeter hyperdense foci in the left occipital lobe persists but slightly diminished from prior CT, series 2, images 22 Calvin 25. No evidence of new hemorrhage. Stable degree of atrophy Calvin chronic small vessel ischemia. Chronic right MCA infarct, unchanged in appearance. There is no subdural collection. No midline shift. Vascular: Atherosclerosis of skullbase vasculature without hyperdense vessel or abnormal calcification. Skull: No fracture or focal lesion. Sinuses/Orbits: No acute findings. Again seen partial opacification of right ethmoid air cells. No mastoid effusion. Bilateral cataract resection. Other: None. IMPRESSION: 1. Subcentimeter hyperdense foci in the left occipital lobe persist but slightly diminished from prior CT, likely resolving hemorrhage. No evidence of new hemorrhage or other acute findings. 2. Stable atrophy, chronic small vessel ischemia, Calvin chronic right MCA infarct. Electronically Signed   By: Andrea Gasman M.D.   On: 02/21/2024 16:14   US  Venous Img Lower Bilateral Result Date: 02/21/2024 CLINICAL DATA:  Bilateral lower leg swelling for 3 days. EXAM: BILATERAL LOWER EXTREMITY VENOUS DOPPLER ULTRASOUND TECHNIQUE: Gray-scale sonography with graded compression, as well as color Doppler Calvin duplex ultrasound were performed to evaluate the lower extremity deep venous systems from the level of the common femoral vein Calvin including the common femoral, femoral, profunda femoral, popliteal Calvin calf veins including the posterior tibial, peroneal Calvin gastrocnemius veins when visible. The superficial great saphenous vein was also interrogated. Spectral Doppler was utilized to evaluate flow at rest Calvin with distal augmentation maneuvers in the common  femoral, femoral Calvin popliteal veins. COMPARISON:  None  Available. FINDINGS: RIGHT LOWER EXTREMITY Common Femoral Vein: No evidence of thrombus. Normal compressibility, respiratory phasicity Calvin response to augmentation. Saphenofemoral Junction: No evidence of thrombus. Normal compressibility Calvin flow on color Doppler imaging. Profunda Femoral Vein: No evidence of thrombus. Normal compressibility Calvin flow on color Doppler imaging. Femoral Vein: No evidence of thrombus. Normal compressibility, respiratory phasicity Calvin response to augmentation. Popliteal Vein: No evidence of thrombus. Normal compressibility, respiratory phasicity Calvin response to augmentation. Calf Veins: No evidence of thrombus. Normal compressibility Calvin flow on color Doppler imaging. Superficial Great Saphenous Vein: No evidence of thrombus. Normal compressibility. Venous Reflux:  None. Other Findings:  Soft tissue edema. LEFT LOWER EXTREMITY Common Femoral Vein: No evidence of thrombus. Normal compressibility, respiratory phasicity Calvin response to augmentation. Saphenofemoral Junction: No evidence of thrombus. Normal compressibility Calvin flow on color Doppler imaging. Profunda Femoral Vein: No evidence of thrombus. Normal compressibility Calvin flow on color Doppler imaging. Femoral Vein: No evidence of thrombus. Normal compressibility, respiratory phasicity Calvin response to augmentation. Popliteal Vein: No evidence of thrombus. Normal compressibility, respiratory phasicity Calvin response to augmentation. Calf Veins: No evidence of thrombus. Normal compressibility Calvin flow on color Doppler imaging. Superficial Great Saphenous Vein: No evidence of thrombus. Normal compressibility. Venous Reflux:  None. Other Findings:  Soft tissue edema. IMPRESSION: No evidence of deep venous thrombosis in either lower extremity. Electronically Signed   By: Newell Eke M.D.   On: 02/21/2024 16:03   DG Chest 2 View Result Date: 02/21/2024 CLINICAL DATA:  Edema. EXAM: CHEST - 2 VIEW COMPARISON:  01/17/2024 Calvin CT chest  11/29/2017. FINDINGS: Trachea is midline. Heart is at the upper limits of normal in size. Thoracic aorta is calcified. Small right pleural effusion with right basilar atelectasis. Trace left pleural effusion. Flowing anterior osteophytosis in the thoracic spine. IMPRESSION: 1. Small right pleural effusion with right basilar atelectasis. 2. Trace left pleural effusion. Electronically Signed   By: Newell Eke M.D.   On: 02/21/2024 15:42   ECHOCARDIOGRAM COMPLETE Result Date: 01/18/2024    ECHOCARDIOGRAM REPORT   Patient Name:   TAYARI YANKEE Date of Exam: 01/18/2024 Medical Rec #:  [id number]       Height:       70.0 in Accession #:    [id number]      Weight:       151.7 lb Date of Birth:  [birth year]       BSA:          1.856 m Patient Age:    89 years        BP:           157/84 mmHg Patient Gender: M               HR:           64 bpm. Exam Location:  Inpatient Procedure: 2D Echo, 3D Echo, Cardiac Doppler, Color Doppler Calvin Strain Analysis            (Both Spectral Calvin Color Flow Doppler were utilized during            procedure). Indications:    Aortic stenosis  History:        Patient has prior history of Echocardiogram examinations, most                 recent 07/07/2023. Aortic Valve Disease, Arrythmias:Atrial  Fibrillation; Risk Factors:Hypertension, Diabetes Calvin                 Dyslipidemia.  Sonographer:    Therisa Crouch Referring Phys: [id number] JONATHAN SEGARS IMPRESSIONS  1. Left ventricular ejection fraction, by estimation, is 50 to 55%. The left ventricle has low normal function. The left ventricle has no regional wall motion abnormalities. Left ventricular diastolic parameters are indeterminate.  2. Right ventricular systolic function is mildly reduced. The right ventricular size is normal. Tricuspid regurgitation signal is inadequate for assessing PA pressure.  3. Left atrial size was severely dilated.  4. Right atrial size was severely dilated.  5. The mitral valve is degenerative.  Mild to moderate mitral valve regurgitation. Moderate mitral annular calcification.  6. The aortic valve is tricuspid. There is moderate calcification of the aortic valve. Aortic valve regurgitation is mild. Moderate to severe aortic valve stenosis. Vmax 3.4 m/s, MG , AVA 1.0 cm^2, DI 0.3  7. The inferior vena cava is normal in size with <50% respiratory variability, suggesting right atrial pressure of 8 mmHg. FINDINGS  Left Ventricle: Left ventricular ejection fraction, by estimation, is 50 to 55%. The left ventricle has low normal function. The left ventricle has no regional wall motion abnormalities. The left ventricular internal cavity size was normal in size. There is no left ventricular hypertrophy. Left ventricular diastolic parameters are indeterminate. Right Ventricle: The right ventricular size is normal. No increase in right ventricular wall thickness. Right ventricular systolic function is mildly reduced. Tricuspid regurgitation signal is inadequate for assessing PA pressure. Left Atrium: Left atrial size was severely dilated. Right Atrium: Right atrial size was severely dilated. Pericardium: There is no evidence of pericardial effusion. Mitral Valve: The mitral valve is degenerative in appearance. Moderate mitral annular calcification. Mild to moderate mitral valve regurgitation. Tricuspid Valve: The tricuspid valve is normal in structure. Tricuspid valve regurgitation is mild. Aortic Valve: The aortic valve is tricuspid. There is moderate calcification of the aortic valve. Aortic valve regurgitation is mild. Moderate aortic stenosis is present. Aortic valve mean gradient measures 20.0 mmHg. Aortic valve peak gradient measures 37.1 mmHg. Aortic valve area, by VTI measures 1.12 cm. Pulmonic Valve: The pulmonic valve was not well visualized. Pulmonic valve regurgitation is trivial. Aorta: The aortic root Calvin ascending aorta are structurally normal, with no evidence of dilitation. Venous: The  inferior vena cava is normal in size with less than 50% respiratory variability, suggesting right atrial pressure of 8 mmHg. IAS/Shunts: The interatrial septum was not well visualized.  LEFT VENTRICLE PLAX 2D LVIDd:         5.10 cm LVIDs:         3.70 cm LV PW:         0.90 cm   3D Volume EF LV IVS:        0.80 cm   LV 3D EDV:   83.54 ml LVOT diam:     2.10 cm   LV 3D ESV:   30.30 ml LV SV:         75 LV SV Index:   40 LVOT Area:     3.46 cm  RIGHT VENTRICLE            IVC RV Basal diam:  3.50 cm    IVC diam: 1.80 cm RV S prime:     9.14 cm/s TAPSE (M-mode): 1.5 cm LEFT ATRIUM              Index  RIGHT ATRIUM           Index LA diam:        4.80 cm  2.59 cm/m   RA Area:     27.70 cm LA Vol (A2C):   68.7 ml  37.02 ml/m  RA Volume:   96.50 ml  52.00 ml/m LA Vol (A4C):   133.0 ml 71.67 ml/m LA Biplane Vol: 100.0 ml 53.88 ml/m  AORTIC VALVE AV Area (Vmax):    1.15 cm AV Area (Vmean):   1.16 cm AV Area (VTI):     1.12 cm AV Vmax:           304.67 cm/s AV Vmean:          204.000 cm/s AV VTI:            0.669 m AV Peak Grad:      37.1 mmHg AV Mean Grad:      20.0 mmHg LVOT Vmax:         101.15 cm/s LVOT Vmean:        68.100 cm/s LVOT VTI:          0.216 m LVOT/AV VTI ratio: 0.32  AORTA Ao Root diam: 3.70 cm Ao Asc diam:  3.50 cm  SHUNTS Systemic VTI:  0.22 m Systemic Diam: 2.10 cm Lonni Nanas MD Electronically signed by Lonni Nanas MD Signature Date/Time: 01/18/2024/6:41:30 PM    Final    CT ANGIO HEAD NECK W WO CM Result Date: 01/18/2024 CLINICAL DATA:  89 year old male with abnormal brain MRI 0048 hours today. Acute Calvin chronic intracranial hemorrhage including microhemorrhages, subarachnoid blood. Hypertension Calvin status post fall on Eliquis . Furthermore patient is currently being treated for dementia with NMDA antagonist. EXAM: CT ANGIOGRAPHY HEAD Calvin NECK WITH Calvin WITHOUT CONTRAST TECHNIQUE: Multidetector CT imaging of the head Calvin neck was performed using the standard protocol during  bolus administration of intravenous contrast. Multiplanar CT image reconstructions Calvin MIPs were obtained to evaluate the vascular anatomy. Carotid stenosis measurements (when applicable) are obtained utilizing NASCET criteria, using the distal internal carotid diameter as the denominator. RADIATION DOSE REDUCTION: This exam was performed according to the departmental dose-optimization program which includes automated exposure control, adjustment of the mA Calvin/or kV according to patient size Calvin/or use of iterative reconstruction technique. CONTRAST:  75mL OMNIPAQUE  IOHEXOL  350 MG/ML SOLN COMPARISON:  Brain MRI 0048 hours today, head CT yesterday. FINDINGS: CT HEAD Brain: Multiple subcentimeter small hyperdense intra-axial hemorrhages (series 4, image 13) appears stable from the head CT yesterday. Left posterior convexity subarachnoid hemorrhage or effusion by MRI is largely occult by CT. No new or progressed intracranial hemorrhage identified. No IVH or ventriculomegaly. No intracranial mass effect or midline shift. Stable gray-white matter differentiation throughout the brain., including right temporal lobe encephalomalacia. Calvarium Calvin skull base: Stable.  No skull fracture identified. Paranasal sinuses: Stable, well aerated. Orbits: No discrete orbit or scalp soft tissue injury identified. CTA NECK Skeleton: Advanced chronic cervical spine disc Calvin endplate degeneration. No acute osseous abnormality identified. Upper chest: Negative. Other neck: Negative nonvascular neck soft tissue spaces. Aortic arch: Probable 3 vessel arch but most of the aortic arch is not included. Distal arch atherosclerosis. Right carotid system: Patent with soft Calvin calcified plaque but no significant stenosis. Left carotid system: Patent with soft Calvin calcified plaque. Moderate atherosclerosis at the lateral left ICA origin Calvin bulb. But less than 50 % stenosis with respect to the distal vessel there. Vertebral arteries: Proximal  subclavian arteries are patent without  stenosis. Left subclavian soft Calvin calcified plaque. Normal right vertebral artery origin. Patent left vertebral artery with soft Calvin calcified plaque resulting in mild stenosis. Dominant left vertebral artery with no additional plaque or stenosis to the skull base. Patent non dominant right vertebral to the skull base. CTA HEAD Posterior circulation: Both vertebral arteries are patent to the vertebrobasilar junction. Dominant left V4 with mild plaque. Patent PICA origins. Patent basilar artery without stenosis. Patent SCA Calvin left PCA origins. Fetal type right PCA origin. Left posterior communicating artery diminutive or absent. Patent bilateral PCA branches. Mild P2 irregularity greater on the right. Anterior circulation: Both ICA siphons are patent. Bilateral siphon calcified plaque, mild on the left. Moderate right siphon cavernous segment calcified plaque. No siphon stenosis. Normal right posterior communicating artery origin. Patent, mildly ectatic carotid termini. Normal MCA Calvin ACA origins. Normal anterior communicating artery Calvin bilateral ACA branches. Left MCA M1 segment is tortuous. Left MCA bifurcation is patent, mildly ectatic. Right MCA M1 segment Calvin trifurcation are patent Calvin tortuous. Bilateral MCA branches are within normal limits. Venous sinuses: Patent. Anatomic variants: Dominant left vertebral artery. Fetal right PCA origin. Review of the MIP images confirms the above findings IMPRESSION: 1. Unchanged several acute subcentimeter parenchymal brain hemorrhages by CT. Calvin left posterior convexity SAH or subarachnoid effusion remains occult by CT. No intracranial mass effect. No new intracranial abnormality. 2. CTA is negative for large vessel occlusion or intracranial aneurysm. Widespread atherosclerosis in the head Calvin neck, but no hemodynamically significant arterial stenosis. Electronically Signed   By: VEAR Hurst M.D.   On: 01/18/2024 12:20   EEG  adult Result Date: 01/18/2024 Matthews Elida HERO, MD     01/18/2024  8:54 AM Routine EEG Report Calvin Burnett is a 89 y.o. male with a history of altered mental status Calvin subacute subarachnoid hemorrhage who is undergoing an EEG to evaluate for seizures. Report: This EEG was acquired with electrodes placed according to the International 10-20 electrode system (including Fp1, Fp2, F3, F4, C3, C4, P3, P4, O1, O2, T3, T4, T5, T6, A1, A2, Fz, Cz, Pz). The following electrodes were missing or displaced: none. The occipital dominant rhythm was 7 Hz. This activity is reactive to stimulation. Drowsiness was manifested by background fragmentation; deeper stages of sleep were not identified. There was no focal slowing. There were no interictal epileptiform discharges. There were no electrographic seizures identified. Photic stimulation Calvin hyperventilation were not performed. Impression Calvin clinical correlation: This EEG was obtained while awake Calvin drowsy Calvin is abnormal due to mild diffuse slowing indicative of global cerebral dysfunction. Epileptiform abnormalities were not seen during this recording. Elida Matthews, MD [facility] [phone] If 7pm- 7am, please page neurology on call as listed in AMION.   MR Brain W Calvin Wo Contrast Result Date: 01/18/2024 CLINICAL DATA:  Initial evaluation for acute mental status change. EXAM: MRI HEAD WITHOUT Calvin WITH CONTRAST TECHNIQUE: Multiplanar, multiecho pulse sequences of the brain Calvin surrounding structures were obtained without Calvin with intravenous contrast. CONTRAST:  7mL GADAVIST  GADOBUTROL  1 MMOL/ML IV SOLN COMPARISON:  CT from 01/17/2024 FINDINGS: Brain: Generalized age-related cerebral atrophy. Patchy T2/FLAIR hyperintensity involving the periventricular Calvin deep white matter, most characteristic of chronic microvascular ischemic disease, mild for age. Encephalomalacia Calvin gliosis involving the right temporal lobe, consistent with a chronic right MCA  distribution infarct. Chronic hemosiderin staining present at this location. Additional small remote left frontal cortical infarct noted. No evidence for acute or subacute infarct. Punctate focus of diffusion signal abnormality  at the left occipital lobe felt to be related to adjacent susceptibility artifact (series 5, image 76). Multiple scattered foci of susceptibility artifact are seen involving the bilateral cerebral hemispheres, consistent with micro hemorrhages. A few of these appear to correspond with hyperdensity seen on prior head CT, consistent with small acute bleeds. Most notable of these measures 7 mm at the left occipital lobe (series 12, image 27). These foci are predominantly peripheral in location, with most of these foci new as compared to prior brain MRI from 12/04/2022. Additionally, there is abnormal sulcal FLAIR signal intensity with hemosiderin staining within the left temporal occipital region, concerning for subarachnoid hemorrhage (series 11, images 32-21). No appreciable subarachnoid blood seen on prior head CT. No mass lesion, midline shift, or significant mass effect. Mild ex vacuo dilatation of the right lateral ventricle without hydrocephalus. No extra-axial fluid collection. Pituitary gland Calvin suprasellar region within normal limits. Asymmetric leptomeningeal enhancement seen within the left temporal occipital region, likely reactive in nature due to the underlying subarachnoid findings at these locations (series 17, image 6). No other abnormal enhancement. No findings to suggest metastatic disease. Vascular: Major intracranial vascular flow voids are maintained. Skull Calvin upper cervical spine: Craniocervical junction within normal limits. No acute scalp soft tissue abnormality. Sinuses/Orbits: Prior bilateral ocular lens replacement. Globes or soft tissues demonstrate no acute finding. Mild scattered mucosal thickening present about the ethmoidal air cells. Paranasal sinuses are  otherwise clear. No significant mastoid effusion. Other: None. IMPRESSION: 1. Multiple scattered foci of susceptibility artifact involving the bilateral cerebral hemispheres, consistent with micro hemorrhages. A few of these appear to correspond with hyperdensities seen on prior head CT, consistent with small acute bleeds. Additionally, there is abnormal sulcal FLAIR signal intensity with hemosiderin staining within the left temporoccipital region, concerning for underlying subarachnoid hemorrhage, suspected to be subacute in nature as no hyperdense subarachnoid blood seen on prior head CT. Findings are nonspecific, with primary differential considerations including sequelae of cerebral amyloid angiopathy, trauma, or coagulopathy. 2. Asymmetric leptomeningeal enhancement within the left temporoccipital region, likely reactive in nature due to the underlying subarachnoid findings at these locations. 3. No other acute intracranial abnormality. 4. Chronic right MCA distribution infarct, with additional small remote left frontal cortical infarct. Electronically Signed   By: Morene Hoard M.D.   On: 01/18/2024 01:48   CT HEAD WO CONTRAST Addendum Date: 01/17/2024 ADDENDUM REPORT: 01/17/2024 18:20 ADDENDUM: These results were called by telephone at the time of interpretation on 01/17/2024 at 6:20 pm to provider [facility] , who verbally acknowledged these results. Electronically Signed   By: Morgane  Naveau M.D.   On: 01/17/2024 18:20   Result Date: 01/17/2024 CLINICAL DATA:  Head trauma, moderate-severe; Polytrauma, blunt. History of prostate cancer. EXAM: CT HEAD WITHOUT CONTRAST CT CERVICAL SPINE WITHOUT CONTRAST TECHNIQUE: Multidetector CT imaging of the head Calvin cervical spine was performed following the standard protocol without intravenous contrast. Multiplanar CT image reconstructions of the cervical spine were also generated. RADIATION DOSE REDUCTION: This exam was performed according to the  departmental dose-optimization program which includes automated exposure control, adjustment of the mA Calvin/or kV according to patient size Calvin/or use of iterative reconstruction technique. COMPARISON:  CT head 11/14/2022 FINDINGS: CT HEAD FINDINGS Brain: Patchy Calvin confluent areas of decreased attenuation are noted throughout the deep Calvin periventricular white matter of the cerebral hemispheres bilaterally, compatible with chronic microvascular ischemic disease. 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 No evidence of large-territorial acute infarction. Several foci of hyperdensity along  the left occipital Calvin temporal lobes best evaluated on sagittal Calvin coronal view (5:38, 4:56) with these measuring 4-5 mm each. No mass lesion. No extra-axial collection. No mass effect or midline shift. No hydrocephalus. Basilar cisterns are patent. Vascular: No hyperdense vessel. Atherosclerotic calcifications are present within the cavernous internal carotid arteries. Skull: No acute fracture or focal lesion. Sinuses/Orbits: Paranasal sinuses Calvin mastoid air cells are clear. The orbits are unremarkable. Other: None. CT CERVICAL SPINE FINDINGS Alignment: Normal. Skull base Calvin vertebrae: Multilevel moderate degenerative changes of the spine. Associated severe left C4-C5 osseous neural foramina. No associated severe osseous central canal stenosis. No acute fracture. No aggressive appearing focal osseous lesion or focal pathologic process. Soft tissues Calvin spinal canal: No prevertebral fluid or swelling. No visible canal hematoma. Upper chest: Unremarkable. Other: Atherosclerotic plaque of the carotid arteries within the neck. IMPRESSION: 1. Total of four 4-5 mm hyperdense foci along the left frontal, temporal, occipital lobes. Findings could represent intraparenchymal foci of hemorrhage versus underlying metastases. Recommend follow-up CT head in 4-6 hours. Consider MRI head with Calvin without contrast for  evaluation of underlying masses if clinically indicated. 2. No acute displaced fracture or traumatic listhesis of the cervical spine. 3. Severe left C4-C5 osseous neural foramina. Electronically Signed: By: Morgane  Naveau M.D. On: 01/17/2024 18:17   CT CERVICAL SPINE WO CONTRAST Addendum Date: 01/17/2024 ADDENDUM REPORT: 01/17/2024 18:20 ADDENDUM: These results were called by telephone at the time of interpretation on 01/17/2024 at 6:20 pm to provider [facility] , who verbally acknowledged these results. Electronically Signed   By: Morgane  Naveau M.D.   On: 01/17/2024 18:20   Result Date: 01/17/2024 CLINICAL DATA:  Head trauma, moderate-severe; Polytrauma, blunt. History of prostate cancer. EXAM: CT HEAD WITHOUT CONTRAST CT CERVICAL SPINE WITHOUT CONTRAST TECHNIQUE: Multidetector CT imaging of the head Calvin cervical spine was performed following the standard protocol without intravenous contrast. Multiplanar CT image reconstructions of the cervical spine were also generated. RADIATION DOSE REDUCTION: This exam was performed according to the departmental dose-optimization program which includes automated exposure control, adjustment of the mA Calvin/or kV according to patient size Calvin/or use of iterative reconstruction technique. COMPARISON:  CT head 11/14/2022 FINDINGS: CT HEAD FINDINGS Brain: Patchy Calvin confluent areas of decreased attenuation are noted throughout the deep Calvin periventricular white matter of the cerebral hemispheres bilaterally, compatible with chronic microvascular ischemic disease. 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 No evidence of large-territorial acute infarction. Several foci of hyperdensity along the left occipital Calvin temporal lobes best evaluated on sagittal Calvin coronal view (5:38, 4:56) with these measuring 4-5 mm each. No mass lesion. No extra-axial collection. No mass effect or midline shift. No hydrocephalus. Basilar cisterns are patent. Vascular: No  hyperdense vessel. Atherosclerotic calcifications are present within the cavernous internal carotid arteries. Skull: No acute fracture or focal lesion. Sinuses/Orbits: Paranasal sinuses Calvin mastoid air cells are clear. The orbits are unremarkable. Other: None. CT CERVICAL SPINE FINDINGS Alignment: Normal. Skull base Calvin vertebrae: Multilevel moderate degenerative changes of the spine. Associated severe left C4-C5 osseous neural foramina. No associated severe osseous central canal stenosis. No acute fracture. No aggressive appearing focal osseous lesion or focal pathologic process. Soft tissues Calvin spinal canal: No prevertebral fluid or swelling. No visible canal hematoma. Upper chest: Unremarkable. Other: Atherosclerotic plaque of the carotid arteries within the neck. IMPRESSION: 1. Total of four 4-5 mm hyperdense foci along the left frontal, temporal, occipital lobes. Findings could represent intraparenchymal foci of hemorrhage versus underlying metastases. Recommend  follow-up CT head in 4-6 hours. Consider MRI head with Calvin without contrast for evaluation of underlying masses if clinically indicated. 2. No acute displaced fracture or traumatic listhesis of the cervical spine. 3. Severe left C4-C5 osseous neural foramina. Electronically Signed: By: Morgane  Naveau M.D. On: 01/17/2024 18:17   DG Pelvis Portable Result Date: 01/17/2024 CLINICAL DATA:  Trauma, fall. EXAM: PORTABLE PELVIS 1-2 VIEWS COMPARISON:  04/16/2019. FINDINGS: There is no evidence of acute fracture or dislocation. Mild degenerative changes are noted at the hips bilaterally. There are degenerative changes in lower lumbar spine. Radiation therapy seeds are noted in the region of the prostate gland. IMPRESSION: No acute fracture or dislocation. Electronically Signed   By: Leita Birmingham M.D.   On: 01/17/2024 17:32   DG Chest Port 1 View Result Date: 01/17/2024 CLINICAL DATA:  Trauma.  Fall on blood thinners EXAM: PORTABLE CHEST 1 VIEW COMPARISON:   None Available. FINDINGS: Normal mediastinum Calvin cardiac silhouette. Normal pulmonary vasculature. No evidence of effusion, infiltrate, or pneumothorax. No acute bony abnormality. IMPRESSION: No acute cardiopulmonary process. Electronically Signed   By: Jackquline Boxer M.D.   On: 01/17/2024 17:32         ASSESSMENT & PLAN   Assessment & Plan Cognitive disorder Moderate vascular dementia without behavioral disturbance, psychotic disturbance, mood disturbance, or anxiety ([name]) History of CVA (cerebrovascular accident) Dementia with memory impairment Calvin history of stroke with residual cognitive impairment   Dementia with memory impairment is likely worsened by a previous stroke. He lacks full awareness of his memory issues, raising concerns about his ability to manage medications independently. Memantine , not listed in his current medications, will be refilled to aid memory. A memory test is needed to further assess cognitive function. Refill memantine  prescription, Calvin collect blood Calvin urine samples to rule out other causes of memory impairment.  I informally assessed his memory today, Calvin in my medical opinion this patient cannot safely manage his medications, medical problems, or finances.   Therefore I support incapacity, Calvin daughter seems to be caring for well.  Will coordinate these thoughts to the primary care provider Calvin asked her to follow-up with him for the more formal incapacity paperwork Acute kidney injury superimposed on chronic kidney disease ([name]) Acute on chronic congestive heart failure, unspecified heart failure type ([name]) Chronic kidney disease, stage 3a ([name]) Chronic kidney disease is present with recent elevated creatinine levels, possibly exacerbated by diuretic use. Improvement is expected as fluid retention decreases. Order blood work to assess kidney function.\  Edema of the lower extremities, previously severe, has improved. He is currently on furosemide , but  discontinuation is possible if kidney function tests show improvement. Evaluate blood work results to determine if furosemide  can be discontinued.  Skin tear of forearm without complication, right, initial encounter A healing wound on the arm, previously treated with Neosporin, shows no signs of infection currently. Continued monitoring Calvin dressing are necessary to prevent infection. Redress wound with antibiotic ointment Calvin bandage.  Its been well cared for. Calvin patient appears to be well cared for by daughter.    ORDER ASSOCIATIONS  #   DIAGNOSIS / CONDITION ICD-10 ENCOUNTER ORDER     ICD-10-CM   1. Cognitive disorder  F09 Renal function panel    Protein / creatinine ratio, urine    Microalbumin / creatinine urine ratio    Magnesium     Urinalysis, Routine w reflex microscopic    VITAMIN D  25 Hydroxy (Vit-D Deficiency, Fractures)    TSH + free  T4    B Nat Peptide    B12 Calvin Folate Panel    RPR    2. History of CVA (cerebrovascular accident)  Z86.73     3. Acute kidney injury superimposed on chronic kidney disease ([name])  N17.9 Renal function panel   N18.9 Protein / creatinine ratio, urine    Microalbumin / creatinine urine ratio    Magnesium     Urinalysis, Routine w reflex microscopic    VITAMIN D  25 Hydroxy (Vit-D Deficiency, Fractures)    TSH + free T4    B Nat Peptide    B12 Calvin Folate Panel    RPR    4. Acute on chronic congestive heart failure, unspecified heart failure type ([name])  I50.9 Renal function panel    Protein / creatinine ratio, urine    Microalbumin / creatinine urine ratio    Magnesium     Urinalysis, Routine w reflex microscopic    VITAMIN D  25 Hydroxy (Vit-D Deficiency, Fractures)    TSH + free T4    B Nat Peptide    B12 Calvin Folate Panel    RPR         Orders Placed in Encounter:   Lab Orders         Renal function panel         Protein / creatinine ratio, urine         Microalbumin / creatinine urine ratio         Magnesium          Urinalysis,  Routine w reflex microscopic         VITAMIN D  25 Hydroxy (Vit-D Deficiency, Fractures)         TSH + free T4         B Nat Peptide         B12 Calvin Folate Panel         RPR             This document was synthesized by artificial intelligence ([employer]bridge) using HIPAA-compliant recording of the clinical interaction;   We discussed the use of AI scribe software for clinical note transcription with the patient, who gave verbal consent to proceed. additional Info: This encounter employed state-of-the-art, real-time, collaborative documentation. The patient actively reviewed Calvin assisted in updating their electronic medical record on a shared screen, ensuring transparency Calvin facilitating joint problem-solving for the problem list, overview, Calvin plan. This approach promotes accurate, informed care. The treatment plan was discussed Calvin reviewed in detail, including medication safety, potential side effects, Calvin all patient questions. We confirmed understanding Calvin comfort with the plan. Follow-up instructions were established, including contacting the office for any concerns, returning if symptoms worsen, persist, or new symptoms develop, Calvin precautions for potential emergency department visits.

## 2024-03-04 NOTE — Assessment & Plan Note (Signed)
 Chronic kidney disease is present with recent elevated creatinine levels, possibly exacerbated by diuretic use. Improvement is expected as fluid retention decreases. Order blood work to assess kidney function.\  Edema of the lower extremities, previously severe, has improved. He is currently on furosemide , but discontinuation is possible if kidney function tests show improvement. Evaluate blood work results to determine if furosemide  can be discontinued.

## 2024-03-04 NOTE — Telephone Encounter (Signed)
 [facility] faxed Home Health Certificate (Order [place] [id number]), to be filled out by provider. Patient requested to send it back via Fax within ASAP. Document is located in providers tray at front office.Please advise at [phone].

## 2024-03-04 NOTE — Telephone Encounter (Signed)
 Paperwork to be done at pts appt in September.

## 2024-03-04 NOTE — Patient Instructions (Signed)
 It was a pleasure seeing you today! Your health and satisfaction are our top priorities.  Bernardino Cone, MD  VISIT SUMMARY: You came in today for a follow-up visit after recent hospitalizations. We discussed your memory issues, recent stroke, and other health concerns. Your daughter is actively involved in your care and is seeking a mental competency evaluation for legal and estate matters. We reviewed your medications and made plans for further assessments and treatments.  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 You are not fully aware of your memory problems, so we will refill your memantine  prescription to help with memory. We will also conduct a memory test and collect blood and urine samples to rule out other causes of memory impairment.  -CHRONIC KIDNEY DISEASE: Chronic kidney disease means your kidneys are not working as well as they should. We will order blood work to assess your kidney function, especially since your recent creatinine levels were elevated.  -BRADYCARDIA: Bradycardia is a slower than normal heart rate. Your heart rate has improved from the 30s to the 40s. We decided against a pacemaker due to potential risks. We will continue to monitor your heart rate and symptoms.  -EDEMA OF LOWER EXTREMITIES: Edema is swelling caused by fluid retention. Your leg swelling has improved, and you are currently on furosemide . We will evaluate your blood work to see if you can stop taking furosemide .  -HEALING WOUND ON ARM: You have a healing wound on your arm that shows no signs of infection. We will continue to monitor it and keep it dressed with antibiotic ointment and a bandage.  INSTRUCTIONS: Please follow up with the memory test and blood and urine tests as discussed. Continue taking your medications as prescribed, and monitor your symptoms. Your daughter will assist with  scheduling and managing your care.  Your Providers PCP: Katrinka Garnette KIDD, MD,  [phone]) Referring Provider: Katrinka Garnette KIDD, MD,  [phone]) Care Team Provider: Sheldon Standing, MD,  [phone]) Care Team Provider: Aneita Gwendlyn DASEN, MD Care Team Provider: Pietro Redell RAMAN, MD,  [phone]) Care Team Provider: Matilda Garnette, MD,  [phone]) Care Team Provider: Leila Bound, [place],  [phone]) Care Team Provider: Georjean Darice HERO, MD,  [phone]) Care Team Provider: Pietro Redell RAMAN, MD,  [phone]) Care Team Provider: Livingston Rigg, MD,  [phone]) Care Team Provider: Livingston Rigg, MD,  [phone]) Care Team Provider: Kennyth Chew, MD,  [phone]) Care Team Provider: Caleen Dirks, MD,  [phone])  NEXT STEPS: [x]  Early Intervention: Schedule sooner appointment, call our on-call services, or go to emergency room if there is any significant Increase in pain or discomfort New or worsening symptoms Sudden or severe changes in your health [x]  Flexible Follow-Up: We recommend a No follow-ups on file. for optimal routine care. This allows for progress monitoring and treatment adjustments. [x]  Preventive Care: Schedule your annual preventive care visit! It's typically covered by insurance and helps identify potential health issues early. [x]  Lab & X-ray Appointments: Incomplete tests scheduled today, or call to schedule. X-rays: [facility] (M-F, 8:30am-noon or 1pm-5pm). [x]  Medical Information Release: Sign a release form at front desk to obtain relevant medical information we don't have.  MAKING THE MOST OF OUR FOCUSED 20 MINUTE APPOINTMENTS: [x]   Clearly state your top concerns at the beginning of the visit to focus our discussion [x]   If you anticipate you will need more time, please  inform the front desk during scheduling - we can book multiple appointments in the same week. [x]   If you have transportation problems- use  our convenient video appointments or ask about transportation support. [x]   We can get down to business faster if you use MyChart to update information before the visit and submit non-urgent questions before your visit. Thank you for taking the time to provide details through MyChart.  Let our nurse know and she can import this information into your encounter documents.  Arrival and Wait Times: [x]   Arriving on time ensures that everyone receives prompt attention. [x]   Early morning (8a) and afternoon (1p) appointments tend to have shortest wait times. [x]   Unfortunately, we cannot delay appointments for late arrivals or hold slots during phone calls.  Getting Answers and Following Up [x]   Simple Questions & Concerns: For quick questions or basic follow-up after your visit, reach us  at [phone] or MyChart messaging. [x]   Complex Concerns: If your concern is more complex, scheduling an appointment might be best. Discuss this with the staff to find the most suitable option. [x]   Lab & Imaging Results: We'll contact you directly if results are abnormal or you don't use MyChart. Most normal results will be on MyChart within 2-3 business days, with a review message from Dr. Jesus. Haven't heard back in 2 weeks? Need results sooner? Contact us  at [phone]. [x]   Referrals: Our referral coordinator will manage specialist referrals. The specialist's office should contact you within 2 weeks to schedule an appointment. Call us  if you haven't heard from them after 2 weeks.  Staying Connected [x]   MyChart: Activate your MyChart for the fastest way to access results and message us . See the last page of this paperwork for instructions on how to activate.  Bring to Your Next Appointment [x]   Medications: Please bring all your medication bottles to your next appointment to ensure we have an accurate record of your prescriptions. [x]   Health Diaries: If you're monitoring any health conditions at  home, keeping a diary of your readings can be very helpful for discussions at your next appointment.  Billing [x]   X-ray & Lab Orders: These are billed by separate companies. Contact the invoicing company directly for questions or concerns. [x]   Visit Charges: Discuss any billing inquiries with our administrative services team.  Your Satisfaction Matters [x]   Share Your Experience: We strive for your satisfaction! If you have any complaints, or preferably compliments, please let Dr. Jesus know directly or contact our Practice Administrators, Manuelita Rubin or [employer], by asking at the front desk.   Reviewing Your Records [x]   Review this early draft of your clinical encounter notes below and the final encounter summary tomorrow on MyChart after its been completed.  All orders placed so far are visible here: Cognitive disorder -     Renal function panel -     Protein / creatinine ratio, urine -     Microalbumin / creatinine urine ratio -     Magnesium  -     Urinalysis, Routine w reflex microscopic -     VITAMIN D  25 Hydroxy (Vit-D Deficiency, Fractures) -     TSH + free T4 -     Brain natriuretic peptide -     B12 and Folate Panel -     RPR  History of CVA (cerebrovascular accident)  Acute kidney injury superimposed on chronic kidney disease ([name]) -     Renal function panel -  Protein / creatinine ratio, urine -     Microalbumin / creatinine urine ratio -     Magnesium  -     Urinalysis, Routine w reflex microscopic -     VITAMIN D  25 Hydroxy (Vit-D Deficiency, Fractures) -     TSH + free T4 -     Brain natriuretic peptide -     B12 and Folate Panel -     RPR  Acute on chronic congestive heart failure, unspecified heart failure type ([name]) -     Renal function panel -     Protein / creatinine ratio, urine -     Microalbumin / creatinine urine ratio -     Magnesium  -     Urinalysis, Routine w reflex microscopic -     VITAMIN D  25 Hydroxy (Vit-D Deficiency,  Fractures) -     TSH + free T4 -     Brain natriuretic peptide -     B12 and Folate Panel -     RPR  Moderate vascular dementia without behavioral disturbance, psychotic disturbance, mood disturbance, or anxiety ([name])  Chronic kidney disease, stage 3a ([name])  Skin tear of forearm without complication, right, initial encounter

## 2024-03-04 NOTE — Telephone Encounter (Addendum)
 Please disregard message below. Patient's spouse decided to continue with having paperwork completed.

## 2024-03-04 NOTE — Telephone Encounter (Addendum)
 Patient's daughter informed us  that she doesn't believe patient will be deemed competent. She is looking to go through the guardianship process and will not be needing the paperwork dropped off.

## 2024-03-04 NOTE — Assessment & Plan Note (Signed)
 Dementia with memory impairment and history of stroke with residual cognitive impairment   Dementia with memory impairment is likely worsened by a previous stroke. He lacks full awareness of his memory issues, raising concerns about his ability to manage medications independently. Memantine , not listed in his current medications, will be refilled to aid memory. A memory test is needed to further assess cognitive function. Refill memantine  prescription, and collect blood and urine samples to rule out other causes of memory impairment.  I informally assessed his memory today, and in my medical opinion this patient cannot safely manage his medications, medical problems, or finances.   Therefore I support incapacity, and daughter seems to be caring for well.  Will coordinate these thoughts to the primary care provider and asked her to follow-up with him for the more formal incapacity paperwork

## 2024-03-04 NOTE — Telephone Encounter (Signed)
Signed and return faxed

## 2024-03-05 LAB — PROTEIN / CREATININE RATIO, URINE
Creatinine, Urine: 91 mg/dL (ref 20–320)
Protein/Creat Ratio: 978 mg/g{creat} — ABNORMAL HIGH (ref 25–148)
Protein/Creatinine Ratio: 0.978 mg/mg{creat} — ABNORMAL HIGH (ref 0.025–0.148)
Total Protein, Urine: 89 mg/dL — ABNORMAL HIGH (ref 5–25)

## 2024-03-05 LAB — RPR: RPR Ser Ql: NONREACTIVE

## 2024-03-05 LAB — TSH+FREE T4: TSH W/REFLEX TO FT4: 1.68 m[IU]/L (ref 0.40–4.50)

## 2024-03-07 ENCOUNTER — Ambulatory Visit: Payer: Self-pay | Admitting: Internal Medicine

## 2024-03-08 ENCOUNTER — Other Ambulatory Visit: Payer: Self-pay | Admitting: Cardiology

## 2024-03-08 DIAGNOSIS — I480 Paroxysmal atrial fibrillation: Secondary | ICD-10-CM

## 2024-03-10 ENCOUNTER — Ambulatory Visit

## 2024-03-19 ENCOUNTER — Ambulatory Visit: Admitting: Family Medicine

## 2024-03-19 ENCOUNTER — Encounter: Payer: Self-pay | Admitting: Family Medicine

## 2024-03-19 ENCOUNTER — Ambulatory Visit (INDEPENDENT_AMBULATORY_CARE_PROVIDER_SITE_OTHER): Admitting: Family Medicine

## 2024-03-19 ENCOUNTER — Telehealth: Payer: Self-pay

## 2024-03-19 VITALS — BP 122/68 | HR 53 | Temp 98.1°F | Ht 70.0 in | Wt 152.4 lb

## 2024-03-19 DIAGNOSIS — F01B0 Vascular dementia, moderate, without behavioral disturbance, psychotic disturbance, mood disturbance, and anxiety: Secondary | ICD-10-CM

## 2024-03-19 DIAGNOSIS — F01B3 Vascular dementia, moderate, with mood disturbance: Secondary | ICD-10-CM

## 2024-03-19 DIAGNOSIS — E785 Hyperlipidemia, unspecified: Secondary | ICD-10-CM

## 2024-03-19 DIAGNOSIS — Z23 Encounter for immunization: Secondary | ICD-10-CM

## 2024-03-19 DIAGNOSIS — I4891 Unspecified atrial fibrillation: Secondary | ICD-10-CM

## 2024-03-19 DIAGNOSIS — I10 Essential (primary) hypertension: Secondary | ICD-10-CM | POA: Diagnosis not present

## 2024-03-19 DIAGNOSIS — I5032 Chronic diastolic (congestive) heart failure: Secondary | ICD-10-CM

## 2024-03-19 DIAGNOSIS — Z7984 Long term (current) use of oral hypoglycemic drugs: Secondary | ICD-10-CM

## 2024-03-19 DIAGNOSIS — E1169 Type 2 diabetes mellitus with other specified complication: Secondary | ICD-10-CM

## 2024-03-19 DIAGNOSIS — N1831 Chronic kidney disease, stage 3a: Secondary | ICD-10-CM

## 2024-03-19 DIAGNOSIS — E1122 Type 2 diabetes mellitus with diabetic chronic kidney disease: Secondary | ICD-10-CM

## 2024-03-19 MED ORDER — ROSUVASTATIN CALCIUM 10 MG PO TABS
10.0000 mg | ORAL_TABLET | Freq: Every day | ORAL | 3 refills | Status: AC
Start: 2024-03-19 — End: ?

## 2024-03-19 NOTE — Assessment & Plan Note (Signed)
 Type 2 diabetes is well controlled with metformin  500 mg extended release daily. Most recent A1c was 6.2 in April 2025.  We opted to recheck next month - Continue metformin  500 mg extended release daily -microalbuminuria was improving on valsartan - may need to reconsider if worsens at follow up

## 2024-03-19 NOTE — Assessment & Plan Note (Signed)
 Hypertension managed with amlodipine , which may contribute to heart failure symptoms and edema. Discussed reducing amlodipine  dose potentially but ultimately we held off- less than ideal with CHF. continue lasix  20 mg. check cmp next visit

## 2024-03-19 NOTE — Progress Notes (Signed)
 Phone [phone] In person visit   Subjective:   Calvin Burnett is a 89 y.o. year old very pleasant male patient who presents for/with See problem oriented charting Chief Complaint  Patient presents with   Dementia   Discussed the use of AI scribe software for clinical note transcription with the patient, who gave verbal consent to proceed.  History of Present Illness   Calvin Burnett is an 89 year old male with vascular dementia who presents for follow-up. He is accompanied by his daughter, who is his primary caregiver and hcpoa.  He has a history of vascular dementia, exacerbated by a previous stroke. He was on memantine  28 mg daily, which was discontinued during a hospitalization in July 2025 due to bradycardia. He resumed memantine  post-discharge per daughter who reports she was unaware that he was to stop it due to potential bradycardia impacts. He also takes escitalopram  5 mg daily, which has alleviated withdrawal symptoms noted in January 2025.  He has atrial fibrillation and was on Eliquis  5 mg twice daily, which was stopped during his July 2025 hospitalization due to acute brain bleeds as below. His heart rate was managed with metoprolol , which was also discontinued due to bradycardia. He is currently on amlodipine  10 mg for blood pressure control along with lasix  20 mg for heart failure. He has a history of falls, with a significant event in July 2025 leading to hospitalization. An MRI showed multiple small areas of acute brain bleeds, with concerns for cerebral amyloid angiopathy. He has a history of prior MCA infarct and small left frontal infarct.  He has diabetes, managed with metformin  500 mg extended release once daily with breakfast. His most recent A1c was 6.2% in April 2025. He maintains a relatively clean diet and exercises regularly.  He has heart failure with preserved ejection fraction, diagnosed in August 2025, with elevated proBNP levels. He was treated with Lasix   20 mg daily, which he continues. His creatinine levels have improved since hospitalization, and his proBNP has been decreasing.  He is on rosuvastatin  20 mg twice weekly for cholesterol management, with LDL levels close to goal. His most recent LDL was 87 in April 2025. he someimtes misses doses with only taking twice a week.   His daughter manages his medications and finances, and there is ongoing discussion about establishing a new power of attorney due to family dynamics and his cognitive status.  No shortness of breath or trouble breathing. He has not been taking melatonin or Tylenol  recently. does take an otc sleep medicine- not sure of name.         Past Medical History-  Patient Active Problem List   Diagnosis Date Noted   ICH (intracerebral hemorrhage) ([name]) 03/04/2024    Priority: High   Heart failure with preserved ejection fraction ([name]) 02/22/2024    Priority: High   Intra-abdominal abscess ([name]) 04/24/2019    Priority: High   B12 deficiency 12/04/2017    Priority: High   pT1pN0 colon cancer s/p robotic right colectomy 11/26/2017 10/02/2017    Priority: High   Atrial fibrillation with slow ventricular response ([name]) 04/09/2017    Priority: High   Night sweats 09/16/2016    Priority: High   Major depressive disorder with single episode, in full remission ([name]) 07/29/2016    Priority: High   Rectal pain 10/14/2014    Priority: High   Type 2 diabetes mellitus ([name]) 07/31/2010    Priority: High   Vascular dementia ([name]) 07/27/2008  Priority: High   History of CVA (cerebrovascular accident) 01/18/2024    Priority: Medium    Aortic stenosis 04/05/2021    Priority: Medium    BPPV (benign paroxysmal positional vertigo) 12/17/2019    Priority: Medium    History of adenomatous polyp of colon 10/27/2014    Priority: Medium    Syncope 09/27/2014    Priority: Medium    Chronic kidney disease, stage 3a ([name]) 06/01/2014    Priority: Medium    Hyperlipidemia associated  with type 2 diabetes mellitus ([name]) 03/01/2014    Priority: Medium    Basal cell carcinoma 05/03/2009    Priority: Medium    Essential hypertension 01/30/2007    Priority: Medium    Gout 10/28/2019    Priority: Low   Aortic atherosclerosis ([name]) 01/29/2019    Priority: Low   Stricture of sigmoid s/p robotic sigmoidectomy 11/26/2017 11/26/2017    Priority: Low   Chronic anticoagulation 11/26/2017    Priority: Low   Right inguinal hernia 10/02/2017    Priority: Low   Bowel habit changes 10/14/2014    Priority: Low   Symptomatic bradycardia 09/27/2014    Priority: Low   LEG CRAMPS, NOCTURNAL 04/30/2010    Priority: Low   Cervical spondylosis without myelopathy 12/11/2007    Priority: Low   GERD (gastroesophageal reflux disease) 11/05/2007    Priority: Low   Elevated troponin 01/18/2024   Acute metabolic encephalopathy 01/18/2024    Medications- reviewed and updated Current Outpatient Medications  Medication Sig Dispense Refill   amLODipine  (NORVASC ) 10 MG tablet Take 1 tablet (10 mg total) by mouth daily. (Patient taking differently: Take 10 mg by mouth in the morning.) 30 tablet 2   Cholecalciferol (DIALYVITE VITAMIN D  5000) 125 MCG (5000 UT) capsule Take 5,000 Units by mouth daily.     escitalopram  (LEXAPRO ) 5 MG tablet Take 1 tablet (5 mg total) by mouth daily. (Patient taking differently: Take 5 mg by mouth in the morning.) 90 tablet 3   furosemide  (LASIX ) 20 MG tablet Take 1 tablet (20 mg total) by mouth daily. 30 tablet 0   latanoprost  (XALATAN ) 0.005 % ophthalmic solution Place 1 drop into both eyes at bedtime.   12   metFORMIN  (GLUCOPHAGE -XR) 500 MG 24 hr tablet TAKE 1 TABLET BY MOUTH EVERY DAY WITH BREAKFAST 90 tablet 2   rosuvastatin  (CRESTOR ) 10 MG tablet Take 1 tablet (10 mg total) by mouth daily. 90 tablet 3   No current facility-administered medications for this visit.     Objective:  BP 122/68 (BP Location: Right Arm, Patient Position: Sitting, Cuff Size:  Normal)   Pulse (!) 53   Temp 98.1 F (36.7 C) (Temporal)   Ht 5' 10 (1.778 m)   Wt 152 lb 6.4 oz (69.1 kg)   SpO2 96%   BMI 21.87 kg/m  Gen: NAD, resting comfortably CV: Bradycardic Lungs: CTAB no crackles, wheeze, rhonchi Ext: trace edema Skin: warm, dry Neuro: Alert to person, not alert to reason for visit  RADIOLOGY Brain MRI: Multiple small areas of acute bleeds in left frontal, temporal, occipital regions; concern for cerebral amyloid angiopathy; evidence of prior MCA infarct and small left frontal infarct (01/2024) CT scan of the chest due for watchman planning: Poor quality images; no thrombus; calcium  score 266    Assessment and Plan   Assessment & Plan Moderate vascular dementia with mood disturbance ([name]) vascular dementia is exacerbated by previous strokes. Recent MRI revealed multiple small acute bleeds in the left frontal, temporal, and  occipital regions, suggesting cerebral amyloid angiopathy- increasing bleeding risk. Memantine  was previously stopped due to bradycardia but was restarted by the family. The risks of memantine  contributing to bradycardia ( and subsequently falls) were discussed, leading to the decision to discontinue it. - Discontinue memantine  due to risk of bradycardia and associated presyncope/falls risk -Daughter asks if he has capacity for estate planning.  He is unable to fully assess the status of his different accounts or even know who his current power of attorney is so unfortunately I am unable to sign paperwork stating he has capacity-daughter is going to need to reassess with lawyer and she is looking at finding a different lawyer as they do not have the best connection -They can also ask neurology opinion as they are established with them for dementia  Depression portion/mood disturbance managed with escitalopram  5 mg daily, which has been helpful in reducing withdrawal symptoms. - Continue escitalopram  5 mg daily  Atrial fibrillation with  slow ventricular response ([name]) -status post anticoagulation discontinuation, under evaluation for Watchman device Atrial fibrillation was previously managed with Eliquis , discontinued due to intracerebral hemorrhage. Cardiology is considering a Watchman device to reduce stroke risk. Procedural risks and benefits were discussed with the family. Awaiting further evaluation from cardiology regarding candidacy for the Watchman device. - Await cardiology's evaluation for Watchman device candidacy- hoping they hear next week - Follow up with cardiology if no contact by next week Chronic heart failure with preserved ejection fraction [facility]) New diagnosis following hospitalization in August 2025. Managed with Lasix  20 mg daily. Amlodipine  was restarted in the hospital but may contribute to leg swelling. Discussed reducing amlodipine  due to potential contribution to heart failure symptoms but family preferred to hold steady and follow up in 1 month and if pressure still stable consider reducing to 5 mg then - Continue Lasix  20 mg daily -Encouraged to monitor weight daily and should let me know if any increase Essential hypertension Hypertension managed with amlodipine , which may contribute to heart failure symptoms and edema. Discussed reducing amlodipine  dose potentially but ultimately we held off- less than ideal with CHF. continue lasix  20 mg. check cmp next visit Chronic kidney disease, stage 3a ([name]) Chronic kidney disease may be progressing to stage IIIb with recent improvement in creatinine levels from a peak of 2.05 to 1.44. Valsartan  was discontinued due to increased creatinine during hospitalization but we may need to reconsider and simply push fluids if microalbuminuria worsens Type 2 diabetes mellitus with chronic kidney disease, without long-term current use of insulin , unspecified CKD stage ([name]) Type 2 diabetes is well controlled with metformin  500 mg extended release daily. Most recent A1c was  6.2 in April 2025.  We opted to recheck next month - Continue metformin  500 mg extended release daily -microalbuminuria was improving on valsartan - may need to reconsider if worsens at follow up  Hyperlipidemia associated with type 2 diabetes mellitus ([name]) Hyperlipidemia managed with rosuvastatin  20 mg twice weekly but mildly poorly. LDL was 78 in November 2024 and 87 in April 2025. Discussed increasing rosuvastatin  to improve LDL levels and reduce stroke risk. - Increase rosuvastatin  to 10 mg daily from 20 mg twice weekly- also easier to remember   # Complicated power of attorney questions  Brother POA Manson Luckadoo originally- Removed himself 1st 2nd was Lonni blush- step son. 6 months ago or longer Mr Stroot removed Lonni 3rd sister Ronal Muslim- removed herself 2nd  No one is on [place]- daughter declined originally due to some family conflict.  Daughter is jointly on accounts but is not formal POA and patient requesting that at this point but there is some complex family dynamics.  Unfortunately patient does not have capacity.  She is a [employer] power of attorney and either she or his son has been with him at most visits and they both seem to have his best interest at heart  Recommended follow up: Return in about 1 month (around 04/18/2024) for followup or sooner if needed.Schedule b4 you leave. Future Appointments  Date Time Provider Department Center  03/25/2024  1:00 PM Katrinka Garnette KIDD, MD [facility]  03/30/2024  1:45 PM Inocencio Soyla Lunger, MD [facility]  04/22/2024  1:00 PM Katrinka Garnette KIDD, MD [facility]  04/28/2024 10:00 AM Penumalli, Eduard SAUNDERS, MD [facility] None  08/19/2024  9:00 AM Dina Camie BRAVO, PA-C [facility] None    Lab/Order associations:   ICD-10-CM   1. Moderate vascular dementia with mood disturbance ([name])  F01.B3     2. Atrial fibrillation with slow ventricular response ([name])  I48.91     3. Chronic heart failure with  preserved ejection fraction ([name])  I50.32     4. Essential hypertension  I10     5. Chronic kidney disease, stage 3a ([name])  N18.31     6. Type 2 diabetes mellitus with chronic kidney disease, without long-term current use of insulin , unspecified CKD stage ([name])  E11.22     7. Hyperlipidemia associated with type 2 diabetes mellitus ([name])  E11.69    E78.5     8. Need for influenza vaccination  Z23 Flu vaccine HIGH DOSE PF(Fluzone Trivalent)      Meds ordered this encounter  Medications   rosuvastatin  (CRESTOR ) 10 MG tablet    Sig: Take 1 tablet (10 mg total) by mouth daily.    Dispense:  90 tablet    Refill:  3   I personally spent a total of 60 minutes in the care of the patient today including preparing to see the patient, getting/reviewing separately obtained history-particularly the details of family complexity or extensive, performing a medically appropriate exam/evaluation, counseling and educating including extensive discussion on capacity, and documenting clinical information in the EHR.  Return precautions advised.  Garnette Katrinka, MD

## 2024-03-19 NOTE — Assessment & Plan Note (Signed)
 Chronic kidney disease may be progressing to stage IIIb with recent improvement in creatinine levels from a peak of 2.05 to 1.44. Valsartan  was discontinued due to increased creatinine during hospitalization but we may need to reconsider and simply push fluids if microalbuminuria worsens

## 2024-03-19 NOTE — Assessment & Plan Note (Signed)
 vascular dementia is exacerbated by previous strokes. Recent MRI revealed multiple small acute bleeds in the left frontal, temporal, and occipital regions, suggesting cerebral amyloid angiopathy- increasing bleeding risk. Memantine  was previously stopped due to bradycardia but was restarted by the family. The risks of memantine  contributing to bradycardia ( and subsequently falls) were discussed, leading to the decision to discontinue it. - Discontinue memantine  due to risk of bradycardia and associated presyncope/falls risk -Daughter asks if he has capacity for estate planning.  He is unable to fully assess the status of his different accounts or even know who his current power of attorney is so unfortunately I am unable to sign paperwork stating he has capacity-daughter is going to need to reassess with lawyer and she is looking at finding a different lawyer as they do not have the best connection -They can also ask neurology opinion as they are established with them for dementia  Depression portion/mood disturbance managed with escitalopram  5 mg daily, which has been helpful in reducing withdrawal symptoms. - Continue escitalopram  5 mg daily

## 2024-03-19 NOTE — Assessment & Plan Note (Signed)
 Hyperlipidemia managed with rosuvastatin  20 mg twice weekly but mildly poorly. LDL was 78 in November 2024 and 87 in April 2025. Discussed increasing rosuvastatin  to improve LDL levels and reduce stroke risk. - Increase rosuvastatin  to 10 mg daily from 20 mg twice weekly- also easier to remember

## 2024-03-19 NOTE — Patient Instructions (Addendum)
 Stay off namenda   Stop rosuvastatin  20 mg twice weekly- start 10 mg daily instead to simplify  I will reach out to neurology for their perspective  Consider reducing amlodipine - check back in next month  Let me know name of sleeping pill  Start vitamin D  5000 units daily OTC (available over the counter without a prescription)   Recommended follow up: Return in about 1 month (around 04/18/2024) for followup or sooner if needed.Schedule b4 you leave.

## 2024-03-19 NOTE — Assessment & Plan Note (Signed)
-  status post anticoagulation discontinuation, under evaluation for Watchman device Atrial fibrillation was previously managed with Eliquis , discontinued due to intracerebral hemorrhage. Cardiology is considering a Watchman device to reduce stroke risk. Procedural risks and benefits were discussed with the family. Awaiting further evaluation from cardiology regarding candidacy for the Watchman device. - Await cardiology's evaluation for Watchman device candidacy- hoping they hear next week - Follow up with cardiology if no contact by next week

## 2024-03-19 NOTE — Telephone Encounter (Signed)
 Poor quality images but I could see enough to know it would be appropriate for a Watchman.  Calvin Burnett: Poor image quality. Distal filling defect but looks ok in delayed images. Max 24/ AVG 22/ Depth 15.5 Likely use a 27mm device Inf-mid/Mid TSP RAO 25 CAU 25

## 2024-03-19 NOTE — Assessment & Plan Note (Signed)
 New diagnosis following hospitalization in August 2025. Managed with Lasix  20 mg daily. Amlodipine  was restarted in the hospital but may contribute to leg swelling. Discussed reducing amlodipine  due to potential contribution to heart failure symptoms but family preferred to hold steady and follow up in 1 month and if pressure still stable consider reducing to 5 mg then - Continue Lasix  20 mg daily -Encouraged to monitor weight daily and should let me know if any increase

## 2024-03-21 ENCOUNTER — Ambulatory Visit: Payer: Self-pay | Admitting: Cardiology

## 2024-03-25 ENCOUNTER — Encounter: Payer: Self-pay | Admitting: Family Medicine

## 2024-03-25 ENCOUNTER — Ambulatory Visit: Admitting: Family Medicine

## 2024-03-25 ENCOUNTER — Ambulatory Visit: Payer: Self-pay | Admitting: Family Medicine

## 2024-03-25 VITALS — BP 122/68 | HR 75 | Temp 98.1°F | Ht 70.0 in | Wt 153.2 lb

## 2024-03-25 DIAGNOSIS — I10 Essential (primary) hypertension: Secondary | ICD-10-CM | POA: Diagnosis not present

## 2024-03-25 DIAGNOSIS — I5032 Chronic diastolic (congestive) heart failure: Secondary | ICD-10-CM

## 2024-03-25 DIAGNOSIS — Z7984 Long term (current) use of oral hypoglycemic drugs: Secondary | ICD-10-CM

## 2024-03-25 DIAGNOSIS — E1122 Type 2 diabetes mellitus with diabetic chronic kidney disease: Secondary | ICD-10-CM

## 2024-03-25 DIAGNOSIS — F01B0 Vascular dementia, moderate, without behavioral disturbance, psychotic disturbance, mood disturbance, and anxiety: Secondary | ICD-10-CM | POA: Diagnosis not present

## 2024-03-25 LAB — HEMOGLOBIN A1C: Hgb A1c MFr Bld: 6.7 % — ABNORMAL HIGH (ref 4.6–6.5)

## 2024-03-25 LAB — CBC WITH DIFFERENTIAL/PLATELET
Basophils Absolute: 0.1 K/uL (ref 0.0–0.1)
Basophils Relative: 0.9 % (ref 0.0–3.0)
Eosinophils Absolute: 0.1 K/uL (ref 0.0–0.7)
Eosinophils Relative: 1.9 % (ref 0.0–5.0)
HCT: 40 % (ref 39.0–52.0)
Hemoglobin: 13.5 g/dL (ref 13.0–17.0)
Lymphocytes Relative: 28.1 % (ref 12.0–46.0)
Lymphs Abs: 1.6 K/uL (ref 0.7–4.0)
MCHC: 33.8 g/dL (ref 30.0–36.0)
MCV: 93.9 fl (ref 78.0–100.0)
Monocytes Absolute: 0.4 K/uL (ref 0.1–1.0)
Monocytes Relative: 8.1 % (ref 3.0–12.0)
Neutro Abs: 3.4 K/uL (ref 1.4–7.7)
Neutrophils Relative %: 61 % (ref 43.0–77.0)
Platelets: 148 K/uL — ABNORMAL LOW (ref 150.0–400.0)
RBC: 4.26 Mil/uL (ref 4.22–5.81)
RDW: 14.7 % (ref 11.5–15.5)
WBC: 5.5 K/uL (ref 4.0–10.5)

## 2024-03-25 LAB — COMPREHENSIVE METABOLIC PANEL WITH GFR
ALT: 14 U/L (ref 0–53)
AST: 22 U/L (ref 0–37)
Albumin: 4.1 g/dL (ref 3.5–5.2)
Alkaline Phosphatase: 97 U/L (ref 39–117)
BUN: 19 mg/dL (ref 6–23)
CO2: 28 meq/L (ref 19–32)
Calcium: 9.1 mg/dL (ref 8.4–10.5)
Chloride: 104 meq/L (ref 96–112)
Creatinine, Ser: 1.45 mg/dL (ref 0.40–1.50)
GFR: 43.08 mL/min — ABNORMAL LOW (ref >60.00)
Glucose, Bld: 155 mg/dL — ABNORMAL HIGH (ref 70–99)
Potassium: 4.3 meq/L (ref 3.5–5.1)
Sodium: 139 meq/L (ref 135–145)
Total Bilirubin: 0.7 mg/dL (ref 0.2–1.2)
Total Protein: 7.6 g/dL (ref 6.0–8.3)

## 2024-03-25 MED ORDER — VITAMIN D (ERGOCALCIFEROL) 1.25 MG (50000 UNIT) PO CAPS: 50000.0000 [IU] | ORAL_CAPSULE | ORAL | 1 refills | Status: AC

## 2024-03-25 MED ORDER — AMLODIPINE BESYLATE 5 MG PO TABS: 5.0000 mg | ORAL_TABLET | Freq: Every day | ORAL | 3 refills | Status: AC

## 2024-03-25 NOTE — Patient Instructions (Addendum)
 With ongoing swelling lets reduce amlodipine  to 5 mg down from 10 mg.   Start vitamin D  50000 units once a week  I will see if Dr. Jesus can fill out similar sheet  Please stop by lab before you go If you have mychart- we will send your results within 3 business days of us  receiving them.  If you do not have mychart- we will call you about results within 5 business days of us  receiving them.  *please also note that you will see labs on mychart as soon as they post. I will later go in and write notes on them- will say notes from Dr. Katrinka   Recommended follow up: Return in about 4 months (around 07/25/2024) for followup or sooner if needed.Schedule b4 you leave.

## 2024-03-25 NOTE — Progress Notes (Addendum)
 Phone [phone] In person visit   Subjective:   Calvin Burnett is a 89 y.o. year old very pleasant male patient who presents for/with See problem oriented charting Chief Complaint  Patient presents with   Medical Management of Chronic Issues   Past Medical History-  Patient Active Problem List   Diagnosis Date Noted   ICH (intracerebral hemorrhage) ([name]) 03/04/2024    Priority: High   Heart failure with preserved ejection fraction ([name]) 02/22/2024    Priority: High   Aortic stenosis 04/05/2021    Priority: High   Intra-abdominal abscess ([name]) 04/24/2019    Priority: High   B12 deficiency 12/04/2017    Priority: High   pT1pN0 colon cancer s/p robotic right colectomy 11/26/2017 10/02/2017    Priority: High   Atrial fibrillation with slow ventricular response ([name]) 04/09/2017    Priority: High   Night sweats 09/16/2016    Priority: High   Major depressive disorder with single episode, in full remission ([name]) 07/29/2016    Priority: High   Rectal pain 10/14/2014    Priority: High   Type 2 diabetes mellitus ([name]) 07/31/2010    Priority: High   Vascular dementia ([name]) 07/27/2008    Priority: High   History of CVA (cerebrovascular accident) 01/18/2024    Priority: Medium    BPPV (benign paroxysmal positional vertigo) 12/17/2019    Priority: Medium    History of adenomatous polyp of colon 10/27/2014    Priority: Medium    Syncope 09/27/2014    Priority: Medium    Chronic kidney disease, stage 3a ([name]) 06/01/2014    Priority: Medium    Hyperlipidemia associated with type 2 diabetes mellitus ([name]) 03/01/2014    Priority: Medium    Basal cell carcinoma 05/03/2009    Priority: Medium    Essential hypertension 01/30/2007    Priority: Medium    Gout 10/28/2019    Priority: Low   Aortic atherosclerosis ([name]) 01/29/2019    Priority: Low   Stricture of sigmoid s/p robotic sigmoidectomy 11/26/2017 11/26/2017    Priority: Low   Chronic anticoagulation 11/26/2017     Priority: Low   Right inguinal hernia 10/02/2017    Priority: Low   Bowel habit changes 10/14/2014    Priority: Low   Symptomatic bradycardia 09/27/2014    Priority: Low   LEG CRAMPS, NOCTURNAL 04/30/2010    Priority: Low   Cervical spondylosis without myelopathy 12/11/2007    Priority: Low   GERD (gastroesophageal reflux disease) 11/05/2007    Priority: Low   Elevated troponin 01/18/2024   Acute metabolic encephalopathy 01/18/2024    Medications- reviewed and updated Current Outpatient Medications  Medication Sig Dispense Refill   amLODipine  (NORVASC ) 5 MG tablet Take 1 tablet (5 mg total) by mouth daily. 90 tablet 3   escitalopram  (LEXAPRO ) 5 MG tablet Take 1 tablet (5 mg total) by mouth daily. (Patient taking differently: Take 5 mg by mouth in the morning.) 90 tablet 3   furosemide  (LASIX ) 20 MG tablet Take 1 tablet (20 mg total) by mouth daily. 30 tablet 0   latanoprost  (XALATAN ) 0.005 % ophthalmic solution Place 1 drop into both eyes at bedtime.   12   metFORMIN  (GLUCOPHAGE -XR) 500 MG 24 hr tablet TAKE 1 TABLET BY MOUTH EVERY DAY WITH BREAKFAST 90 tablet 2   rosuvastatin  (CRESTOR ) 10 MG tablet Take 1 tablet (10 mg total) by mouth daily. 90 tablet 3   Vitamin D , Ergocalciferol , (DRISDOL ) 1.25 MG (50000 UNIT) CAPS capsule Take 1 capsule (50,000 Units  total) by mouth every 7 (seven) days. 13 capsule 1   No current facility-administered medications for this visit.     Objective:  BP 122/68 (BP Location: Left Arm, Patient Position: Sitting, Cuff Size: Normal)   Pulse 75   Temp 98.1 F (36.7 C) (Temporal)   Ht 5' 10 (1.778 m)   Wt 153 lb 3.2 oz (69.5 kg)   SpO2 98%   BMI 21.98 kg/m  Gen: NAD, resting comfortably CV: RRR ongoing systolic murmur Lungs: CTAB no crackles, wheeze, rhonchi Abdomen: soft/nontender/nondistended/normal bowel sounds. No rebound or guarding.  Ext: trace edema Skin: warm, dry Neuro: looks to daughter for most answers    Assessment and Plan   #  Capacity discussion-patient saw a lawyer who felt he had competence.  We had a good discussion today about how that differs from capacity.  On my evaluation last week I deemed that he did not have capacity from a medical perspective in my opinion.  The need a letter stating this as well as another letter from a recent physician-Dr. Jesus saw him recently and had the same perspective- so will have a letter form him as well  #Vascular dementia- Dr. Georjean S: Patient discontinued memantine  as we discussed after last visit-daughter helping him manage his medication -Neurology evaluated escitalopram  5 mg due to him feeling more withdrawn January 2025  A/P: Vascular dementia noted-not on any medications at this point due to bradycardia risk-continue to monitor.  At some point we may discontinue neurology follow-up given medication options have been limited  #Atrial fibrillation S: medication(s): none -prior eliquis  stopped due to falsl July 2025 and intracranial hemorrhage  - Prior metoprolol  stopped due to bradycardia  in July 2025 hospitalization with bradycardia A/P: Rate controlled without medication.  Not anticoagulated due to fall risk -Upcoming discussion about Watchman procedure  # Diabetes S: Compliant withMetformin 500 mg XR daily with breakfast Lab Results  Component Value Date   HGBA1C 6.2 11/12/2023   HGBA1C 6.2 06/11/2023   HGBA1C 6.8 (H) 11/28/2022  A/P: Diabetes has been well-controlled-update today and we discussed could push out October visit 4 months   #hyperlipidemia/history of stroke-may have been atrial fibrillation related S: compliant with rosuvastatin  10 mg daily A/P: We recently changed rosuvastatin  to daily instead of 20 mg twice weekly due to ease of use-reevaluate at next visit   #CHF- new diagnosis August 2025 during hospitalization #hypertension S: Medication:Lasix  20 mg daily, amlodipine  10 mg -Weight is stable no increased edema A/P: With CHF  diagnosis-weight and edema seems stable on Lasix  20 mg daily.  Amlodipine  10 mg creates risks and blood pressure is well-controlled 2 consecutive visit so we opted to trial 5 mg instead-recheck at next visit  #vitamin D  deficiency-poor control start high-dose 50,000 units weekly for 6 months-prescription easier for him to handle and over-the-counter options Last vitamin D  Lab Results  Component Value Date   VD25OH 19.28 (L) 03/04/2024   # Microalbuminuria-levels trending down on valsartan  but had to be taken off due to creatinine increased-we may have to reconsider if there is significant increase again at follow-up-recheck micro and creatinine ratio next visit  Recommended follow up: Return in about 4 months (around 07/25/2024) for followup or sooner if needed.Schedule b4 you leave. Future Appointments  Date Time Provider Department Center  03/30/2024  1:45 PM Inocencio Soyla Lunger, MD [facility]  04/22/2024  1:00 PM Katrinka Garnette KIDD, MD [facility]  04/28/2024 10:00 AM Penumalli, Eduard SAUNDERS, MD [facility] None  08/19/2024  9:00 AM Dina, Camie BRAVO, PA-C [facility] None    Lab/Order associations:   ICD-10-CM   1. Type 2 diabetes mellitus with chronic kidney disease, without long-term current use of insulin , unspecified CKD stage ([name])  E11.22 Comprehensive metabolic panel with GFR    CBC with Differential/Platelet    Hemoglobin A1c    2. Moderate vascular dementia without behavioral disturbance, psychotic disturbance, mood disturbance, or anxiety ([name])  F01.B0     3. Chronic heart failure with preserved ejection fraction ([name])  I50.32     4. Essential hypertension  I10       Meds ordered this encounter  Medications   amLODipine  (NORVASC ) 5 MG tablet    Sig: Take 1 tablet (5 mg total) by mouth daily.    Dispense:  90 tablet    Refill:  3   Vitamin D , Ergocalciferol , (DRISDOL ) 1.25 MG (50000 UNIT) CAPS capsule    Sig: Take 1 capsule (50,000 Units total) by mouth every 7 (seven)  days.    Dispense:  13 capsule    Refill:  1    Return precautions advised.  Garnette Lukes, MD

## 2024-03-25 NOTE — Progress Notes (Signed)
 Conferred with Dr. Katrinka on case. Reflected on my encounter with this patient 03/04/24 Wrote a letter to support legal determinations of capacity/competence in financial/medical decision making

## 2024-03-30 ENCOUNTER — Ambulatory Visit: Attending: Cardiology | Admitting: Cardiology

## 2024-03-30 ENCOUNTER — Encounter: Payer: Self-pay | Admitting: Cardiology

## 2024-03-30 VITALS — BP 130/84 | HR 54 | Ht 70.0 in | Wt 156.1 lb

## 2024-03-30 DIAGNOSIS — I10 Essential (primary) hypertension: Secondary | ICD-10-CM

## 2024-03-30 DIAGNOSIS — D6869 Other thrombophilia: Secondary | ICD-10-CM

## 2024-03-30 DIAGNOSIS — I4821 Permanent atrial fibrillation: Secondary | ICD-10-CM | POA: Diagnosis not present
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
## 2024-03-30 NOTE — Patient Instructions (Signed)
 Medication Instructions:  Your physician has recommended you make the following change in your medication:  STOP Metoprolol   *If you need a refill on your cardiac medications before your next appointment, please call your pharmacy*  Lab Work: None ordered   Testing/Procedures: None ordered  Follow-Up: At [facility], you and your health needs are our priority.  As part of our continuing mission to provide you with exceptional heart care, our providers are all part of one team.  This team includes your primary Cardiologist (physician) and Advanced Practice Providers or APPs (Physician Assistants and Nurse Practitioners) who all work together to provide you with the care you need, when you need it.  Your next appointment:   1 year(s)  Provider:   You will see one of the following Advanced Practice Providers on your designated Care Team:   Charlies Arthur, PA-C Michael Andy Tillery, PA-C Brandi Ollis, NP Artist Pouch, PA-C      Thank you for choosing [facility]!!   Maeola Domino, RN [phone]

## 2024-03-31 ENCOUNTER — Telehealth: Payer: Self-pay | Admitting: Family Medicine

## 2024-03-31 NOTE — Telephone Encounter (Signed)
 Left vm for pts daughter regarding letter being ready for pick up, it is located at the front desk in the patient folders.    Copied from CRM #[id number]. Topic: Clinical - Medical Advice >> Mar 30, 2024  4:57 PM Kevelyn M wrote: Reason for CRM: Patient's daughter calling in because she needs two doctors to right a letter of competency/capacity. Dr. Katrinka has already written his letter. Dr. Katrinka was supposed to send Dr. Jesus an email to request to see if he could write another letter.  Call back #[phone] Calla)

## 2024-04-06 ENCOUNTER — Emergency Department (HOSPITAL_BASED_OUTPATIENT_CLINIC_OR_DEPARTMENT_OTHER)

## 2024-04-06 ENCOUNTER — Encounter (HOSPITAL_BASED_OUTPATIENT_CLINIC_OR_DEPARTMENT_OTHER): Payer: Self-pay | Admitting: *Deleted

## 2024-04-06 ENCOUNTER — Other Ambulatory Visit: Payer: Self-pay

## 2024-04-06 ENCOUNTER — Emergency Department (HOSPITAL_BASED_OUTPATIENT_CLINIC_OR_DEPARTMENT_OTHER)
Admission: EM | Admit: 2024-04-06 | Discharge: 2024-04-06 | Disposition: A | Discharge status: Home / Self Care | Attending: Emergency Medicine | Admitting: Emergency Medicine

## 2024-04-06 DIAGNOSIS — E119 Type 2 diabetes mellitus without complications: Secondary | ICD-10-CM | POA: Insufficient documentation

## 2024-04-06 DIAGNOSIS — R918 Other nonspecific abnormal finding of lung field: Secondary | ICD-10-CM | POA: Diagnosis not present

## 2024-04-06 DIAGNOSIS — I700 Atherosclerosis of aorta: Secondary | ICD-10-CM | POA: Diagnosis not present

## 2024-04-06 DIAGNOSIS — R600 Localized edema: Secondary | ICD-10-CM | POA: Diagnosis not present

## 2024-04-06 DIAGNOSIS — Z79899 Other long term (current) drug therapy: Secondary | ICD-10-CM | POA: Insufficient documentation

## 2024-04-06 DIAGNOSIS — Z7984 Long term (current) use of oral hypoglycemic drugs: Secondary | ICD-10-CM | POA: Diagnosis not present

## 2024-04-06 DIAGNOSIS — Z8546 Personal history of malignant neoplasm of prostate: Secondary | ICD-10-CM | POA: Insufficient documentation

## 2024-04-06 DIAGNOSIS — R0602 Shortness of breath: Secondary | ICD-10-CM | POA: Diagnosis not present

## 2024-04-06 DIAGNOSIS — I10 Essential (primary) hypertension: Secondary | ICD-10-CM | POA: Diagnosis not present

## 2024-04-06 DIAGNOSIS — M7989 Other specified soft tissue disorders: Secondary | ICD-10-CM | POA: Diagnosis present

## 2024-04-06 DIAGNOSIS — I4891 Unspecified atrial fibrillation: Secondary | ICD-10-CM | POA: Diagnosis not present

## 2024-04-06 LAB — CBC WITH DIFFERENTIAL/PLATELET
Abs Immature Granulocytes: 0.04 K/uL (ref 0.00–0.07)
Basophils Absolute: 0.1 K/uL (ref 0.0–0.1)
Basophils Relative: 1 %
Eosinophils Absolute: 0.2 K/uL (ref 0.0–0.5)
Eosinophils Relative: 4 %
HCT: 36 % — ABNORMAL LOW (ref 39.0–52.0)
Hemoglobin: 12.4 g/dL — ABNORMAL LOW (ref 13.0–17.0)
Immature Granulocytes: 1 %
Lymphocytes Relative: 31 %
Lymphs Abs: 1.7 K/uL (ref 0.7–4.0)
MCH: 32.4 pg (ref 26.0–34.0)
MCHC: 34.4 g/dL (ref 30.0–36.0)
MCV: 94 fL (ref 80.0–100.0)
Monocytes Absolute: 0.4 K/uL (ref 0.1–1.0)
Monocytes Relative: 7 %
Neutro Abs: 3 K/uL (ref 1.7–7.7)
Neutrophils Relative %: 56 %
Platelets: 166 K/uL (ref 150–400)
RBC: 3.83 MIL/uL — ABNORMAL LOW (ref 4.22–5.81)
RDW: 13.8 % (ref 11.5–15.5)
WBC: 5.3 K/uL (ref 4.0–10.5)
nRBC: 0 % (ref 0.0–0.2)

## 2024-04-06 LAB — COMPREHENSIVE METABOLIC PANEL WITH GFR
ALT: 20 U/L (ref 0–44)
AST: 30 U/L (ref 15–41)
Albumin: 4.4 g/dL (ref 3.5–5.0)
Alkaline Phosphatase: 113 U/L (ref 38–126)
Anion gap: 12 (ref 5–15)
BUN: 16 mg/dL (ref 8–23)
CO2: 25 mmol/L (ref 22–32)
Calcium: 9.4 mg/dL (ref 8.9–10.3)
Chloride: 104 mmol/L (ref 98–111)
Creatinine, Ser: 1.29 mg/dL — ABNORMAL HIGH (ref 0.61–1.24)
GFR, Estimated: 53 mL/min — ABNORMAL LOW (ref >60)
Glucose, Bld: 161 mg/dL — ABNORMAL HIGH (ref 70–99)
Potassium: 3.6 mmol/L (ref 3.5–5.1)
Sodium: 141 mmol/L (ref 135–145)
Total Bilirubin: 0.7 mg/dL (ref 0.0–1.2)
Total Protein: 7.7 g/dL (ref 6.5–8.1)

## 2024-04-06 LAB — PRO BRAIN NATRIURETIC PEPTIDE: Pro Brain Natriuretic Peptide: 945 pg/mL — ABNORMAL HIGH (ref <300.0)

## 2024-04-06 LAB — TROPONIN T, HIGH SENSITIVITY: Troponin T High Sensitivity: 19 ng/L (ref 0–19)

## 2024-04-06 MED ORDER — FUROSEMIDE 10 MG/ML IJ SOLN
40.0000 mg | Freq: Once | INTRAMUSCULAR | Status: AC
  Administered 2024-04-06: 40 mg via INTRAVENOUS
  Filled 2024-04-06: qty 4

## 2024-04-06 NOTE — Discharge Instructions (Addendum)
 Recommend that you take your furosemide /Lasix  20 mg twice daily for the next 4 days and then back to 20 mg daily.  Follow-up with your primary care doctor.  Return if symptoms worsen.  Recommend using compression socks as well.  You can buy these over-the-counter.

## 2024-04-06 NOTE — ED Notes (Signed)
 Xray at [facility] in triage

## 2024-04-06 NOTE — ED Notes (Signed)
 Pt discharged home and given discharge paperwork. Opportunities given for questions. Pt verbalizes understanding. PIV removed x1. Bethena Powell SAUNDERS , RN

## 2024-04-06 NOTE — ED Triage Notes (Addendum)
 BIB daughter from home for pedeal edema, h/o same, hasn't been this bad, onset 3d ago, endorses TTP and weight gain. Denies pain, sob, or other sx. Alert, NAD, calm, steady gait. H/o afib.

## 2024-04-06 NOTE — ED Provider Notes (Signed)
 [facility] Provider Note   CSN: [id number] Arrival date & time: 04/06/24  1558     Patient presents with: Edema   Calvin Burnett is a 89 y.o. male.   Patient here with some leg swelling.  Maybe started a couple days ago.  He denies chest pain shortness of breath weakness numbness tingling.  History of A-fib.  Not on anticoagulation.  History of hypertension prostate cancer.  Family noticed leg swelling here recently.  He is on Lasix .  They have lowered the dose of amlodipine  recently to see if that help with leg swelling as well.  He denies any fevers or chills.  He has been at his baseline.  The history is provided by the patient and a caregiver.       Prior to Admission medications   Medication Sig Start Date End Date Taking? Authorizing Provider  amLODipine  (NORVASC ) 5 MG tablet Take 1 tablet (5 mg total) by mouth daily. 03/25/24   Katrinka Garnette KIDD, MD  escitalopram  (LEXAPRO ) 5 MG tablet Take 1 tablet (5 mg total) by mouth daily. Patient taking differently: Take 5 mg by mouth in the morning. 01/22/24   Regalado, Belkys A, MD  furosemide  (LASIX ) 20 MG tablet Take 1 tablet (20 mg total) by mouth daily. 02/24/24 02/23/25  Arlon Carliss ORN, DO  latanoprost  (XALATAN ) 0.005 % ophthalmic solution Place 1 drop into both eyes at bedtime.  08/05/15   [provider]  metFORMIN  (GLUCOPHAGE -XR) 500 MG 24 hr tablet TAKE 1 TABLET BY MOUTH EVERY DAY WITH BREAKFAST 02/23/24   Katrinka Garnette KIDD, MD  rosuvastatin  (CRESTOR ) 10 MG tablet Take 1 tablet (10 mg total) by mouth daily. 03/19/24   Katrinka Garnette KIDD, MD  valsartan  (DIOVAN ) 80 MG tablet Take 80 mg by mouth daily. 01/30/24   [provider]  Vitamin D , Ergocalciferol , (DRISDOL ) 1.25 MG (50000 UNIT) CAPS capsule Take 1 capsule (50,000 Units total) by mouth every 7 (seven) days. 03/25/24   Katrinka Garnette KIDD, MD    Allergies: Kcentra  [prothrombin  complex conc human]    Review of  Systems  Updated Vital Signs BP (!) 151/79 (BP Location: Right Arm)   Pulse (!) 48   Temp 97.6 F (36.4 C)   Resp 18   Ht 5' 10 (1.778 m)   Wt 71.4 kg   SpO2 98%   BMI 22.59 kg/m   Physical Exam Vitals and nursing note reviewed.  Constitutional:      General: He is not in acute distress.    Appearance: He is well-developed. He is not ill-appearing.  HENT:     Head: Normocephalic and atraumatic.     Nose: Nose normal.     Mouth/Throat:     Mouth: Mucous membranes are moist.  Eyes:     Extraocular Movements: Extraocular movements intact.     Conjunctiva/sclera: Conjunctivae normal.     Pupils: Pupils are equal, round, and reactive to light.  Cardiovascular:     Rate and Rhythm: Normal rate and regular rhythm.     Pulses: Normal pulses.     Heart sounds: Normal heart sounds. No murmur heard. Pulmonary:     Effort: Pulmonary effort is normal. No respiratory distress.     Breath sounds: Normal breath sounds.  Abdominal:     General: Abdomen is flat.     Palpations: Abdomen is soft.     Tenderness: There is no abdominal tenderness.  Musculoskeletal:  General: No swelling.     Cervical back: Normal range of motion and neck supple.     Right lower leg: Edema present.     Left lower leg: Edema present.     Comments: 1+ pitting edema bilaterally  Skin:    General: Skin is warm and dry.     Capillary Refill: Capillary refill takes less than 2 seconds.  Neurological:     General: No focal deficit present.     Mental Status: He is alert and oriented to person, place, and time.     Cranial Nerves: No cranial nerve deficit.     Sensory: No sensory deficit.     Motor: No weakness.     Coordination: Coordination normal.  Psychiatric:        Mood and Affect: Mood normal.     (all labs ordered are listed, but only abnormal results are displayed) Labs Reviewed  CBC WITH DIFFERENTIAL/PLATELET - Abnormal; Notable for the following components:      Result Value   RBC  3.83 (*)    Hemoglobin 12.4 (*)    HCT 36.0 (*)    All other components within normal limits  COMPREHENSIVE METABOLIC PANEL WITH GFR - Abnormal; Notable for the following components:   Glucose, Bld 161 (*)    Creatinine, Ser 1.29 (*)    GFR, Estimated 53 (*)    All other components within normal limits  PRO BRAIN NATRIURETIC PEPTIDE - Abnormal; Notable for the following components:   Pro Brain Natriuretic Peptide 945.0 (*)    All other components within normal limits  TROPONIN T, HIGH SENSITIVITY    EKG: EKG Interpretation Date/Time:  Tuesday April 06 2024 16:15:01 EDT Ventricular Rate:  52 PR Interval:    QRS Duration:  102 QT Interval:  454 QTC Calculation: 422 R Axis:   83  Text Interpretation: Atrial fibrillation with slow ventricular response Septal infarct , age undetermined Abnormal ECG When compared with ECG of 21-Feb-2024 14:38, PREVIOUS ECG IS PRESENT Confirmed by Ruthe Cornet [phone]) on 04/06/2024 4:16:52 PM  Radiology: [facility] Chest Portable 1 View Result Date: 04/06/2024 EXAM: 1 VIEW XRAY OF THE CHEST 04/06/2024 04:27:00 PM COMPARISON: 02/21/2024 CLINICAL HISTORY: sob. Per triage notes pedeal edema, h/o same, hasn't been this bad, onset 3d ago, endorses TTP and weight gain. Denies pain, sob, or other sx. Alert, NAD, calm, steady gait. H/o afib. FINDINGS: LUNGS AND PLEURA: Bilateral lower lobe increased interstitial opacities identified. Hazy asymmetric opacity over the right lower lung may reflect atelectasis or airspace disease. Decrease right pleural effusion. No significant pleural effusion. No pneumothorax identified. No pulmonary edema. HEART AND MEDIASTINUM: Aortic atherosclerosis. No acute abnormality of the cardiac and mediastinal silhouettes. BONES AND SOFT TISSUES: No acute osseous abnormality. IMPRESSION: 1. Hazy asymmetric right lower lung opacity, which may reflect atelectasis or airspace disease. 2. Bilateral lower lobe increased interstitial opacities. 3.  Incidental aortic atherosclerosis. Electronically signed by: Waddell Calk MD 04/06/2024 04:48 PM EDT RP Workstation: [id number]     Procedures   Medications Ordered in the ED  furosemide  (LASIX ) injection 40 mg (has no administration in time range)                                    Medical Decision Making Amount and/or Complexity of Data Reviewed Labs: ordered. Radiology: ordered.  Risk Prescription drug management.   Chesley JAYSON Bihari is here with leg swelling.  Symmetric  leg swelling bilaterally.  History of A-fib, prostate cancer diabetes.  Overall looks like differential diagnosis likely peripheral edema versus less likely heart failure or kidney failure.  He is not having any chest pain or shortness of breath.  He looks very comfortable.  He has rate controlledFibrillation that is bradycardic.  History of the same.  Seems to be his baseline.  CBC CMP BNP troponin chest x-ray obtained.  Per my review interpretation labs and imaging I do not have any concern for ACS or major heart failure exacerbation.  proBNP is at baseline.  Troponin is 19.  Chest x-ray with may be atelectasis versus pneumonia but he has not had any cough or fever.  There is no major pulmonary edema on x-ray.  Overall lab work showed no significant leukocytosis anemia or electrolyte abnormality otherwise.  I do think that this is likely chronic peripheral edema.  Could be secondary to amlodipine  use.  Recommend close follow-up with primary care doctor to discuss may be discontinuing this medication but otherwise I will increase his Lasix  to 20 mg twice a day for the next several days and then back down to 20 mg daily.  Consider compression socks.  Discharged in good condition.  Overall he appears very well and I have no concern for emergent process including PE or major volume overload at this time.  Discharge  This chart was dictated using voice recognition software.  Despite best efforts to proofread,  errors can occur  which can change the documentation meaning.      Final diagnoses:  Peripheral edema    ED Discharge Orders     None          Ruthe Cornet, DO 04/06/24 1712

## 2024-04-08 ENCOUNTER — Other Ambulatory Visit: Payer: Self-pay | Admitting: Family Medicine

## 2024-04-08 NOTE — Telephone Encounter (Signed)
 Copied from CRM [id number]. Topic: Clinical - Medication Refill >> Apr 08, 2024  2:38 PM Chiquita SQUIBB wrote: Medication: furosemide  furosemide  (LASIX ) 20 MG tablet  Patients daughter stated that the ER doctor told the patient to take it four days twice a day on Tuesday when he was in the ER. Patient has now been out since yesterday, and is concerned about gaining the water  again.   Has the patient contacted their pharmacy? Yes- It has no refills on it  (Agent: If no, request that the patient contact the pharmacy for the refill. If patient does not wish to contact the pharmacy document the reason why and proceed with request.) (Agent: If yes, when and what did the pharmacy advise?)  This is the patient's preferred pharmacy:  [id number] - [place], [place] - [address] AT [address] OF [facility] [address] [place] [address] Phone: [phone] Fax: [phone]  Is this the correct pharmacy for this prescription? Yes If no, delete pharmacy and type the correct one.   Has the prescription been filled recently? No  Is the patient out of the medication? Yes  Has the patient been seen for an appointment in the last year OR does the patient have an upcoming appointment? Yes  Can we respond through MyChart? Yes  Agent: Please be advised that Rx refills may take up to 3 business days. We ask that you follow-up with your pharmacy.

## 2024-04-12 ENCOUNTER — Ambulatory Visit: Admitting: Family Medicine

## 2024-04-12 ENCOUNTER — Encounter: Payer: Self-pay | Admitting: Family Medicine

## 2024-04-12 ENCOUNTER — Telehealth: Payer: Self-pay | Admitting: Family Medicine

## 2024-04-12 VITALS — BP 128/72 | HR 68 | Temp 98.1°F | Ht 70.0 in | Wt 154.2 lb

## 2024-04-12 DIAGNOSIS — E1169 Type 2 diabetes mellitus with other specified complication: Secondary | ICD-10-CM

## 2024-04-12 DIAGNOSIS — I5032 Chronic diastolic (congestive) heart failure: Secondary | ICD-10-CM | POA: Diagnosis not present

## 2024-04-12 DIAGNOSIS — E785 Hyperlipidemia, unspecified: Secondary | ICD-10-CM | POA: Diagnosis not present

## 2024-04-12 DIAGNOSIS — E1122 Type 2 diabetes mellitus with diabetic chronic kidney disease: Secondary | ICD-10-CM | POA: Diagnosis not present

## 2024-04-12 DIAGNOSIS — Z7984 Long term (current) use of oral hypoglycemic drugs: Secondary | ICD-10-CM | POA: Diagnosis not present

## 2024-04-12 DIAGNOSIS — M79675 Pain in left toe(s): Secondary | ICD-10-CM

## 2024-04-12 DIAGNOSIS — I10 Essential (primary) hypertension: Secondary | ICD-10-CM

## 2024-04-12 MED ORDER — FUROSEMIDE 20 MG PO TABS: 20.0000 mg | ORAL_TABLET | Freq: Every day | ORAL | 5 refills | Status: AC

## 2024-04-12 NOTE — Patient Instructions (Addendum)
 We have placed a referral for you today to podiatry- please call their # if you do not hear within a week (may be listed below or you may see mychart message within a few days with #).   Refilling lasix - sorry about last week but I was out of office on Thursday and Friday. We gave flexibility to take twice a day if needed if weight or swelling is up.   Bring all medications to next visit- it looks like you are on valsartan  and this can help protect the kidneys from spilling protein but at one point we thought you were off so want to confirm taking at follow up but bring all medications   Recommended follow up: we have a visit on October 9th which was to check blood pressure but blood pressure looks great today so if you want to push that out a few months if things are stable I'm fine with that.

## 2024-04-12 NOTE — Telephone Encounter (Signed)
 Will refill at appt today with Dr. Katrinka.    Copied from CRM [id number]. Topic: Clinical - Medication Question >> Apr 09, 2024  4:16 PM Lauren C wrote: Reason for CRM: Pt wife calling to see why lasix  hasn't been refilled yet. He has been out of the meds since Wednesday. It was previously prescribed by hospitalist Dr. Arlon. Pt is needing this medication as soon as possible to be filled by PCP [facility]. His follow up is not until 9/29.

## 2024-04-12 NOTE — Progress Notes (Signed)
 Phone [phone] In person visit   Subjective:   Calvin Burnett is a 89 y.o. year old very pleasant male patient who presents for/with See problem oriented charting Chief Complaint  Patient presents with   Hospitalization Follow-up   Medical Management of Chronic Issues    Left second and third toe pain;    Past Medical History-  Patient Active Problem List   Diagnosis Date Noted   ICH (intracerebral hemorrhage) ([name]) 03/04/2024    Priority: High   Heart failure with preserved ejection fraction ([name]) 02/22/2024    Priority: High   Aortic stenosis 04/05/2021    Priority: High   Intra-abdominal abscess ([name]) 04/24/2019    Priority: High   B12 deficiency 12/04/2017    Priority: High   pT1pN0 colon cancer s/p robotic right colectomy 11/26/2017 10/02/2017    Priority: High   Atrial fibrillation with slow ventricular response ([name]) 04/09/2017    Priority: High   Night sweats 09/16/2016    Priority: High   Major depressive disorder with single episode, in full remission 07/29/2016    Priority: High   Rectal pain 10/14/2014    Priority: High   Type 2 diabetes mellitus ([name]) 07/31/2010    Priority: High   Vascular dementia ([name]) 07/27/2008    Priority: High   History of CVA (cerebrovascular accident) 01/18/2024    Priority: Medium    BPPV (benign paroxysmal positional vertigo) 12/17/2019    Priority: Medium    History of adenomatous polyp of colon 10/27/2014    Priority: Medium    Syncope 09/27/2014    Priority: Medium    Chronic kidney disease, stage 3a ([name]) 06/01/2014    Priority: Medium    Hyperlipidemia associated with type 2 diabetes mellitus ([name]) 03/01/2014    Priority: Medium    Basal cell carcinoma 05/03/2009    Priority: Medium    Essential hypertension 01/30/2007    Priority: Medium    Gout 10/28/2019    Priority: Low   Aortic atherosclerosis 01/29/2019    Priority: Low   Stricture of sigmoid s/p robotic sigmoidectomy 11/26/2017 11/26/2017     Priority: Low   Chronic anticoagulation 11/26/2017    Priority: Low   Right inguinal hernia 10/02/2017    Priority: Low   Bowel habit changes 10/14/2014    Priority: Low   Symptomatic bradycardia 09/27/2014    Priority: Low   LEG CRAMPS, NOCTURNAL 04/30/2010    Priority: Low   Cervical spondylosis without myelopathy 12/11/2007    Priority: Low   GERD (gastroesophageal reflux disease) 11/05/2007    Priority: Low   Elevated troponin 01/18/2024   Acute metabolic encephalopathy 01/18/2024    Medications- reviewed and updated Current Outpatient Medications  Medication Sig Dispense Refill   amLODipine  (NORVASC ) 5 MG tablet Take 1 tablet (5 mg total) by mouth daily. 90 tablet 3   escitalopram  (LEXAPRO ) 5 MG tablet Take 1 tablet (5 mg total) by mouth daily. (Patient taking differently: Take 5 mg by mouth in the morning.) 90 tablet 3   latanoprost  (XALATAN ) 0.005 % ophthalmic solution Place 1 drop into both eyes at bedtime.   12   metFORMIN  (GLUCOPHAGE -XR) 500 MG 24 hr tablet TAKE 1 TABLET BY MOUTH EVERY DAY WITH BREAKFAST 90 tablet 2   rosuvastatin  (CRESTOR ) 10 MG tablet Take 1 tablet (10 mg total) by mouth daily. 90 tablet 3   valsartan  (DIOVAN ) 80 MG tablet Take 80 mg by mouth daily.     Vitamin D , Ergocalciferol , (DRISDOL ) 1.25 MG (50000  UNIT) CAPS capsule Take 1 capsule (50,000 Units total) by mouth every 7 (seven) days. 13 capsule 1   furosemide  (LASIX ) 20 MG tablet Take 1 tablet (20 mg total) by mouth daily. If weight gain 3 lbs in a day or 5 lbs over 3 days or swelling worsens may take twice a day for up to 3 days 45 tablet 5   No current facility-administered medications for this visit.     Objective:  BP 128/72 (BP Location: Left Arm, Patient Position: Sitting, Cuff Size: Normal)   Pulse 68   Temp 98.1 F (36.7 C) (Temporal)   Ht 5' 10 (1.778 m)   Wt 154 lb 3.2 oz (69.9 kg)   SpO2 95%   BMI 22.13 kg/m  Gen: NAD, resting comfortably CV: RRR stable murmur Lungs: CTAB no  crackles, wheeze, rhonchi Ext: trace edema astable Skin: warm, dry     Assessment and Plan   # Emergency department follow-up #Edema/CHF S: Patient reported to the emergency department 04/06/2024 with bilateral leg swelling starting a couple days prior.  No chest pain or shortness of breath or weakness or numbness or tingling.  Has history of atrial fibrillation but was not on anticoagulation due to falls history and prior intra cranial hemorrhage.  He was consistent with his Lasix .  We recently reduced his amlodipine  to see if this would helpprevious blood pressure was high in the emergency department and noted 1+ pitting edema bilaterally.  Mild anemia at 12.4 and creatinine was at 1.29 with GFR of 53-these were stable.  proBNP at 945 but troponin not elevated and the proBNP was his baseline.  Was in A-fib on EKG  They did increase his Lasix  to 20 mg twice daily for several days and recommended outpatient primary care follow-up but after a few days he ran out of lasix  and I was out of office and appears not refilled despite request A/P: Patient with known CHF.  At last visit we were reducing amlodipine  to see if we can help with edema from 10 mg to 5 mg.  Despite this patient became less mobile due to toe pain below and ended up having worsening swelling noted as 1+ pitting edema in the emergency department.  His Lasix  was increased to twice daily for a few days but he ended up running out of Lasix  and has been off for a few days but despite this he thankfully has not had increased swelling and in fact the swelling is back to baseline from last visit.  His weight had also increased to 157 but his trend back down to 154 from 153 last visit.  I refilled his Lasix  and give flexibility to take twice daily if needed with increased edema or swelling in the future.  Granddaughter supported today and will communicate to his daughter who helps with his medications and lives with them     # Left toe pain S:  Left second toe third pain reported starting at least 2 weeks ago. Granddaughter has noted he's not walking as much and reports discomfort  A/P: Most significant pain at MTP joints of 2nd and 3rd toes on the left foot-wonder about capsulitis.  Offered x-rays versus podiatry referral and he opted for podiatry consult which was placed today-follow-up for new or worsening symptoms.  # Diabetes S: Compliant with Metformin  500 mg XR daily with breakfast Lab Results  Component Value Date   HGBA1C 6.7 (H) 03/25/2024   HGBA1C 6.2 11/12/2023   HGBA1C 6.2 06/11/2023  A/P: Well-controlled-continue current medication   #hyperlipidemia/history of stroke-may have been atrial fibrillation related #history of MI- demand ischemia July 2025 hospitzation with orthostatic blood pressure drops and bradycardia S: compliant with rosuvastatin  20 mg weekly--> 10 mg daily  Lab Results  Component Value Date   CHOL 137 06/11/2023   HDL 42.40 06/11/2023   LDLCALC 78 06/11/2023   LDLDIRECT 87.0 11/12/2023   TRIG 84.0 06/11/2023   CHOLHDL 3 06/11/2023  A/P: Lipids have been slightly above goal-hopefully improved at next visit-updating lipid panel at next visit with labs-full panel planned.  LDL goal under 70 ideally with MI history and history of stroke   #hypertension/CKD stage III S: Medication:Lasix  20 mg daily, amlodipine  10 mg--> 5 mg, valsartan  80 mg-I thought patient was off of this but he reports being back on this-prior worsening renal function on valsartan  - Really would be on valsartan  with microalbuminuria  Most recent GFR stable at 53 A/P: Blood pressure well-controlled on Lasix  20 mg daily, amlodipine  down to 5 mg, valsartan  80 mg-continue current medication and recheck next visit-also recheck  U next visitACR   # Aortic stenosis-murmur is stable  Recommended follow up: No follow-ups on file. Future Appointments  Date Time Provider Department Center  04/28/2024 10:00 AM Penumalli, Eduard SAUNDERS, MD  [facility] None  06/30/2024 10:20 AM Katrinka Garnette KIDD, MD [facility]  08/19/2024  9:00 AM Dina Camie BRAVO, PA-C [facility] None    Lab/Order associations:   ICD-10-CM   1. Chronic heart failure with preserved ejection fraction ([name])  I50.32     2. Pain in left toe(s)  M79.675 Ambulatory referral to Podiatry    3. Type 2 diabetes mellitus with chronic kidney disease, without long-term current use of insulin , unspecified CKD stage ([name])  E11.22     4. Essential hypertension  I10     5. Hyperlipidemia associated with type 2 diabetes mellitus ([name])  E11.69    E78.5       Meds ordered this encounter  Medications   furosemide  (LASIX ) 20 MG tablet    Sig: Take 1 tablet (20 mg total) by mouth daily. If weight gain 3 lbs in a day or 5 lbs over 3 days or swelling worsens may take twice a day for up to 3 days    Dispense:  45 tablet    Refill:  5    Return precautions advised.  Garnette Katrinka, MD

## 2024-04-15 ENCOUNTER — Ambulatory Visit: Payer: Self-pay

## 2024-04-15 ENCOUNTER — Ambulatory Visit (INDEPENDENT_AMBULATORY_CARE_PROVIDER_SITE_OTHER): Admitting: Family

## 2024-04-15 VITALS — BP 104/62 | HR 97 | Temp 97.3°F | Ht 70.0 in | Wt 154.4 lb

## 2024-04-15 DIAGNOSIS — M79604 Pain in right leg: Secondary | ICD-10-CM | POA: Diagnosis not present

## 2024-04-15 DIAGNOSIS — M79605 Pain in left leg: Secondary | ICD-10-CM | POA: Diagnosis not present

## 2024-04-15 DIAGNOSIS — M79672 Pain in left foot: Secondary | ICD-10-CM | POA: Diagnosis not present

## 2024-04-15 NOTE — Telephone Encounter (Signed)
 What is the update on the podiatry referral?  That was our planned next step  If the leg itself is swelling more compared to the opposite side we could also do DVT scan to rule out clot on Friday

## 2024-04-15 NOTE — Telephone Encounter (Signed)
 FYI Only or Action Required?: FYI only for provider.  Patient was last seen in primary care on 04/12/2024 by Katrinka Garnette KIDD, MD.  Called Nurse Triage reporting Foot Swelling.  Symptoms began several days ago.  Interventions attempted: Prescription medications: lasix .  Symptoms are: gradually worsening.  Triage Disposition: See Physician Within 24 Hours  Patient/caregiver understands and will follow disposition?: Yes  Copied from CRM #[id number]. Topic: Clinical - Red Word Triage >> Apr 15, 2024 10:09 AM Chiquita SQUIBB wrote: Red Word that prompted transfer to Nurse Triage: Patients nurse [place] with [facility] is calling in to let the doctor know the patient is having pain in his left foot toes. She is currently with the patient. And also that the patient is not losing the fluid like he should be, she stated he is not using the bathroom as frequent as he should. Reason for Disposition  [1] Swollen foot AND [2] no fever  (Exceptions: Localized bump from bunions, calluses, insect bite, sting.)  Answer Assessment - Initial Assessment Questions Seen on Monday. increased lasix ; states not urinating more despite increase in medicine. Pt denies urinary sxs. Denies abd pain. Denies issues starting stream. denies sob cp, bilat legs are edematous; R trace, L 1+; pain to toes on L foot   1. ONSET: When did the pain start?      One week ago  2. LOCATION: Where is the pain located?      Toes on L foot 3. PAIN: How bad is the pain?    (Scale 1-10; or mild, moderate, severe)     5/10 4. WORK OR EXERCISE: Has there been any recent work or exercise that involved this part of the body?      denies 5. CAUSE: What do you think is causing the foot pain?     Unsure at this time 6. OTHER SYMPTOMS: Do you have any other symptoms? (e.g., leg pain, rash, fever, numbness)     denies  Protocols used: Foot Pain-A-AH

## 2024-04-15 NOTE — Telephone Encounter (Signed)
 See patient message and advise. I know you looked at this at his last appt.
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
## 2024-04-16 NOTE — Telephone Encounter (Signed)
 Patient scheduled to see Podiatry 04/23/2024. Will call and get update on swelling today.

## 2024-04-22 ENCOUNTER — Ambulatory Visit: Admitting: Family Medicine

## 2024-04-23 ENCOUNTER — Ambulatory Visit (INDEPENDENT_AMBULATORY_CARE_PROVIDER_SITE_OTHER)

## 2024-04-23 ENCOUNTER — Ambulatory Visit

## 2024-04-23 DIAGNOSIS — M779 Enthesopathy, unspecified: Secondary | ICD-10-CM

## 2024-04-23 DIAGNOSIS — G5762 Lesion of plantar nerve, left lower limb: Secondary | ICD-10-CM

## 2024-04-23 NOTE — Progress Notes (Signed)
  Subjective:  Patient ID: Calvin Burnett, male    DOB: Apr 13, 1934,  MRN: [id number]  Chief Complaint  Patient presents with   Neuroma    Rm21 Patient complains of  pain left 2nd IMS/ 1 month constant pain with walking and pressure/no treatment.    Discussed the use of AI scribe software for clinical note transcription with the patient, who gave verbal consent to proceed.  History of Present Illness Calvin Burnett is an 89 year old male who presents with pain in the toes and ball of the foot on the left side. He is accompanied by their daughter.  Pain has been present for about a month, primarily affecting the toes, excluding the big toe, and the ball of the foot. The sensation is described as 'just hurts' and is worst between his second and third toes. The pain is localized without significant radiation. No trauma is recalled. There is no swelling or redness associated with the pain.      Objective:    Physical Exam   Constitutional Well developed. Well nourished. Oriented to person, place, and time.  Vascular Dorsalis pedis pulses palpable bilaterally. Posterior tibial pulses palpable bilaterally. Capillary refill normal to all digits.  No cyanosis or clubbing noted. Pedal hair growth normal.  Neurologic Normal speech. Epicritic sensation to light touch grossly intact bilaterally. Negative tinel sign at tarsal tunnel bilaterally.   Dermatologic Skin texture and turgor are within normal limits.  No open wounds No skin lesions.  Musculoskeletal: 5 out of 5 muscle strength all major pedal muscle groups.  Rectus foot shape.  Deviation between the 2nd and 3rd toes.  Significant pain to palpation in the right second interspace.  No clear Mulder click.  Minimal pain to palpation of the 2nd and 3rd metatarsophalangeal joints.  Minimal pain to palpation distal third metatarsal shaft and neck.   Radiographs: Taken and reviewed 3 views of the left foot.  These demonstrate a positive  Floretta sign in the left second interspace causing deviation between the 2nd and 3rd digits.  The metatarsal heads are very close together.  There is slight cortical thickening of the third metatarsal without evidence of fracture.  Joint spaces otherwise well-maintained.  No acute osseous findings.    Assessment:   1. Capsulitis   2. Neuroma of second interspace of left foot      Plan:  Patient was evaluated and treated and all questions answered.  Assessment and Plan Assessment & Plan Right foot neuroma Pain between metatarsal bones suggests neuroma due to nerve entrapment. X-ray indicates metatarsal proximity could be causing compression. - Provide metatarsal shoe pads to redistribute weight off of ball of foot. - Demonstrated pad placement behind ball of foot. - Instructed to use pads for 3-4 weeks and monitor improvement. - Schedule follow-up in 3-4 weeks to assess pad effectiveness. - Order custom orthotics if pads effective.   Prentice Ovens, DPM

## 2024-04-28 ENCOUNTER — Encounter: Payer: Self-pay | Admitting: Diagnostic Neuroimaging

## 2024-04-28 ENCOUNTER — Ambulatory Visit: Payer: Self-pay | Admitting: Diagnostic Neuroimaging

## 2024-04-28 VITALS — BP 138/78 | HR 76 | Ht 71.0 in | Wt 156.0 lb

## 2024-04-28 DIAGNOSIS — I680 Cerebral amyloid angiopathy: Secondary | ICD-10-CM

## 2024-04-28 DIAGNOSIS — I616 Nontraumatic intracerebral hemorrhage, multiple localized: Secondary | ICD-10-CM

## 2024-04-28 DIAGNOSIS — E854 Organ-limited amyloidosis: Secondary | ICD-10-CM | POA: Diagnosis not present

## 2024-04-28 DIAGNOSIS — F01A0 Vascular dementia, mild, without behavioral disturbance, psychotic disturbance, mood disturbance, and anxiety: Secondary | ICD-10-CM | POA: Diagnosis not present
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
## 2024-05-28 ENCOUNTER — Telehealth: Payer: Self-pay | Admitting: Pharmacist

## 2024-05-28 NOTE — Progress Notes (Signed)
 Pharmacy Quality Measure Review  This patient is appearing on a report for being at risk of failing the adherence measure for hypertension (ACEi/ARB) medications this calendar year.   Medication: valsartan  80mg   Last fill date: 01/30/2024 for 90 day supply  Reviewed recent refill history in Dr Annemarie database. Actual last refill date was 05/16/2024 for 90 day supply. Patient has no refills remaining. Next appointment with PCP is 06/2024.   Also reviewed refill history for rosuvastatin  and metformin  - both were filled for 90 day supply within the last month.   Insurance report was not up to date. No action needed at this time.   Madelin Ray, PharmD Clinical Pharmacist [facility]  Population Health [phone]

## 2024-06-01 DIAGNOSIS — Z961 Presence of intraocular lens: Secondary | ICD-10-CM | POA: Diagnosis not present

## 2024-06-01 DIAGNOSIS — H26492 Other secondary cataract, left eye: Secondary | ICD-10-CM | POA: Diagnosis not present

## 2024-06-01 DIAGNOSIS — E119 Type 2 diabetes mellitus without complications: Secondary | ICD-10-CM | POA: Diagnosis not present

## 2024-06-01 DIAGNOSIS — H3581 Retinal edema: Secondary | ICD-10-CM | POA: Diagnosis not present

## 2024-06-01 DIAGNOSIS — H353112 Nonexudative age-related macular degeneration, right eye, intermediate dry stage: Secondary | ICD-10-CM | POA: Diagnosis not present

## 2024-06-01 DIAGNOSIS — H401132 Primary open-angle glaucoma, bilateral, moderate stage: Secondary | ICD-10-CM | POA: Diagnosis not present

## 2024-06-01 DIAGNOSIS — H353221 Exudative age-related macular degeneration, left eye, with active choroidal neovascularization: Secondary | ICD-10-CM | POA: Diagnosis not present

## 2024-06-04 ENCOUNTER — Ambulatory Visit

## 2024-06-04 DIAGNOSIS — M216X1 Other acquired deformities of right foot: Secondary | ICD-10-CM

## 2024-06-04 DIAGNOSIS — G5762 Lesion of plantar nerve, left lower limb: Secondary | ICD-10-CM

## 2024-06-04 DIAGNOSIS — M216X2 Other acquired deformities of left foot: Secondary | ICD-10-CM | POA: Diagnosis not present

## 2024-06-05 DIAGNOSIS — M216X1 Other acquired deformities of right foot: Secondary | ICD-10-CM | POA: Diagnosis not present

## 2024-06-05 DIAGNOSIS — M216X2 Other acquired deformities of left foot: Secondary | ICD-10-CM | POA: Diagnosis not present

## 2024-06-05 DIAGNOSIS — G5762 Lesion of plantar nerve, left lower limb: Secondary | ICD-10-CM | POA: Diagnosis not present

## 2024-06-05 MED ORDER — TRIAMCINOLONE ACETONIDE 10 MG/ML IJ SUSP
5.0000 mg | Freq: Once | INTRAMUSCULAR | Status: AC
  Administered 2024-06-05: 5 mg via INTRAMUSCULAR

## 2024-06-05 NOTE — Progress Notes (Signed)
  Subjective:  Patient ID: Calvin Burnett, male    DOB: 1933/09/09,  MRN: [id number]  Chief Complaint  Patient presents with   Neuroma    Rm5 Patient complains of pain left 2nd IMS and in 2nd and third toes left/ pads helped with some relief.    Discussed the use of AI scribe software for clinical note transcription with the patient, who gave verbal consent to proceed.  History of Present Illness I last saw patient 6 weeks ago and diagnosed him with neuroma in the second interspace.  I provided him with metatarsal pads.  He has had overall improvement of pain, he does have some pain in between his 3rd and 4th digits now.  He is interested in orthotics.  HPI 04/23/24: Calvin Burnett is an 89 year old male who presents with pain in the toes and ball of the foot on the left side. He is accompanied by their daughter.  Pain has been present for about a month, primarily affecting the toes, excluding the big toe, and the ball of the foot. The sensation is described as 'just hurts' and is worst between his second and third toes. The pain is localized without significant radiation. No trauma is recalled. There is no swelling or redness associated with the pain.      Objective:    Physical Exam   Constitutional Well developed. Well nourished. Oriented to person, place, and time.  Vascular Dorsalis pedis pulses palpable bilaterally. Posterior tibial pulses palpable bilaterally. Capillary refill normal to all digits.  No cyanosis or clubbing noted. Pedal hair growth normal.  Neurologic Normal speech. Epicritic sensation to light touch grossly intact bilaterally. Negative tinel sign at tarsal tunnel bilaterally.   Dermatologic Skin texture and turgor are within normal limits.  No open wounds No skin lesions.  Musculoskeletal: 5 out of 5 muscle strength all major pedal muscle groups.  Rectus foot shape.  Deviation between the 2nd and 3rd toes.  Significant pain to palpation in the right  third interspace, mild pain to second interspace palpation.  Mulder's click present..  Minimal pain to palpation of the 2nd and 3rd metatarsophalangeal joints.  Minimal pain to palpation distal third metatarsal shaft and neck. Decreased ankle joint DF with knee extended, normal with knee flexed.       Assessment:   1. Morton's neuroma, left   2. Acquired equinus deformity of left foot       Plan:  Patient was evaluated and treated and all questions answered.  Assessment and Plan Assessment & Plan Left foot neuroma - Discussed different treatment options with the patient and his daughter who accompanied him today.  He did get some relief from the metatarsal pads and is interested in orthotics.  Today, we scanned him for custom orthotics that we will have a built-in metatarsal pad to offload the metatarsal heads.  He will return to clinic for pickup of these. - Patient had some acute pain in his left third interspace today.  Cortisone injection as described below was performed. - RTC for orthotic pickup   Procedure: Injection interspace Location: Left 3rd interspace proximal to metatarsal heads Skin Prep: alcohol  Injectate: 0.5 cc 0.5% marcaine  plain, 0.5 cc of 1% Lidocaine , 0.5 cc kenalog  10. Disposition: Patient tolerated procedure well. Injection site dressed with a band-aid.   Prentice Ovens, DPM

## 2024-06-16 DIAGNOSIS — L821 Other seborrheic keratosis: Secondary | ICD-10-CM | POA: Diagnosis not present

## 2024-06-16 DIAGNOSIS — D044 Carcinoma in situ of skin of scalp and neck: Secondary | ICD-10-CM | POA: Diagnosis not present

## 2024-06-16 DIAGNOSIS — C4441 Basal cell carcinoma of skin of scalp and neck: Secondary | ICD-10-CM | POA: Diagnosis not present

## 2024-06-16 DIAGNOSIS — Z85828 Personal history of other malignant neoplasm of skin: Secondary | ICD-10-CM | POA: Diagnosis not present

## 2024-06-16 DIAGNOSIS — L570 Actinic keratosis: Secondary | ICD-10-CM | POA: Diagnosis not present

## 2024-06-16 DIAGNOSIS — D1801 Hemangioma of skin and subcutaneous tissue: Secondary | ICD-10-CM | POA: Diagnosis not present

## 2024-06-16 DIAGNOSIS — Z1889 Other specified retained foreign body fragments: Secondary | ICD-10-CM | POA: Diagnosis not present

## 2024-06-30 ENCOUNTER — Encounter: Payer: Self-pay | Admitting: Family Medicine

## 2024-06-30 ENCOUNTER — Ambulatory Visit: Admitting: Family Medicine

## 2024-06-30 VITALS — BP 128/78 | HR 62 | Temp 97.9°F | Ht 71.0 in | Wt 157.8 lb

## 2024-06-30 DIAGNOSIS — I10 Essential (primary) hypertension: Secondary | ICD-10-CM | POA: Diagnosis not present

## 2024-06-30 DIAGNOSIS — Z7984 Long term (current) use of oral hypoglycemic drugs: Secondary | ICD-10-CM | POA: Diagnosis not present

## 2024-06-30 DIAGNOSIS — I5032 Chronic diastolic (congestive) heart failure: Secondary | ICD-10-CM | POA: Diagnosis not present

## 2024-06-30 DIAGNOSIS — E118 Type 2 diabetes mellitus with unspecified complications: Secondary | ICD-10-CM | POA: Diagnosis not present

## 2024-06-30 DIAGNOSIS — E1169 Type 2 diabetes mellitus with other specified complication: Secondary | ICD-10-CM

## 2024-06-30 DIAGNOSIS — Z8546 Personal history of malignant neoplasm of prostate: Secondary | ICD-10-CM

## 2024-06-30 DIAGNOSIS — E785 Hyperlipidemia, unspecified: Secondary | ICD-10-CM | POA: Diagnosis not present

## 2024-06-30 LAB — COMPREHENSIVE METABOLIC PANEL WITH GFR
ALT: 18 U/L (ref 3–53)
AST: 23 U/L (ref 5–37)
Albumin: 4.1 g/dL (ref 3.5–5.2)
Alkaline Phosphatase: 96 U/L (ref 39–117)
BUN: 22 mg/dL (ref 6–23)
CO2: 32 meq/L (ref 19–32)
Calcium: 8.8 mg/dL (ref 8.4–10.5)
Chloride: 100 meq/L (ref 96–112)
Creatinine, Ser: 1.4 mg/dL (ref 0.40–1.50)
GFR: 44.85 mL/min — ABNORMAL LOW (ref >60.00)
Glucose, Bld: 192 mg/dL — ABNORMAL HIGH (ref 70–99)
Potassium: 3.5 meq/L (ref 3.5–5.1)
Sodium: 141 meq/L (ref 135–145)
Total Bilirubin: 0.8 mg/dL (ref 0.2–1.2)
Total Protein: 7.2 g/dL (ref 6.0–8.3)

## 2024-06-30 LAB — MICROALBUMIN / CREATININE URINE RATIO
Creatinine,U: 78.2 mg/dL
Microalb Creat Ratio: 462.9 mg/g — ABNORMAL HIGH (ref 0.0–30.0)
Microalb, Ur: 36.2 mg/dL — ABNORMAL HIGH (ref 0.7–1.9)

## 2024-06-30 LAB — LIPID PANEL
Cholesterol: 86 mg/dL (ref 28–200)
HDL: 46.4 mg/dL (ref >39.00)
LDL Cholesterol: 30 mg/dL (ref 10–99)
NonHDL: 39.76
Total CHOL/HDL Ratio: 2
Triglycerides: 50 mg/dL (ref 10.0–149.0)
VLDL: 10 mg/dL (ref 0.0–40.0)

## 2024-06-30 LAB — CBC WITH DIFFERENTIAL/PLATELET
Basophils Absolute: 0 K/uL (ref 0.0–0.1)
Basophils Relative: 0.9 % (ref 0.0–3.0)
Eosinophils Absolute: 0.1 K/uL (ref 0.0–0.7)
Eosinophils Relative: 2.9 % (ref 0.0–5.0)
HCT: 39.9 % (ref 39.0–52.0)
Hemoglobin: 13.8 g/dL (ref 13.0–17.0)
Lymphocytes Relative: 26.5 % (ref 12.0–46.0)
Lymphs Abs: 1.3 K/uL (ref 0.7–4.0)
MCHC: 34.6 g/dL (ref 30.0–36.0)
MCV: 94.4 fl (ref 78.0–100.0)
Monocytes Absolute: 0.3 K/uL (ref 0.1–1.0)
Monocytes Relative: 6.9 % (ref 3.0–12.0)
Neutro Abs: 3.1 K/uL (ref 1.4–7.7)
Neutrophils Relative %: 62.8 % (ref 43.0–77.0)
Platelets: 163 K/uL (ref 150.0–400.0)
RBC: 4.23 Mil/uL (ref 4.22–5.81)
RDW: 13.9 % (ref 11.5–15.5)
WBC: 5 K/uL (ref 4.0–10.5)

## 2024-06-30 LAB — HEMOGLOBIN A1C: Hgb A1c MFr Bld: 6.5 % (ref 4.6–6.5)

## 2024-06-30 LAB — PSA: PSA: 0 ng/mL — ABNORMAL LOW (ref 0.10–4.00)

## 2024-06-30 NOTE — Progress Notes (Addendum)
 Phone [phone] In person visit   Subjective:   Calvin Burnett is a 89 y.o. year old very pleasant male patient who presents for/with See problem oriented charting Chief Complaint  Patient presents with   Medical Management of Chronic Issues    1 month follow up; per new caregiver he has a knot/hernia on lower right abdomen that has been there about a month; requesting diabetic eye exam form [facility];     Past Medical History-  Patient Active Problem List   Diagnosis Date Noted   ICH (intracerebral hemorrhage) ([name]) 03/04/2024    Priority: High   Heart failure with preserved ejection fraction ([name]) 02/22/2024    Priority: High   Aortic stenosis 04/05/2021    Priority: High   Intra-abdominal abscess ([name]) 04/24/2019    Priority: High   B12 deficiency 12/04/2017    Priority: High   pT1pN0 colon cancer s/p robotic right colectomy 11/26/2017 10/02/2017    Priority: High   Atrial fibrillation with slow ventricular response ([name]) 04/09/2017    Priority: High   Night sweats 09/16/2016    Priority: High   Major depressive disorder with single episode, in full remission 07/29/2016    Priority: High   Rectal pain 10/14/2014    Priority: High   Type 2 diabetes mellitus ([name]) 07/31/2010    Priority: High   Vascular dementia ([name]) 07/27/2008    Priority: High   History of CVA (cerebrovascular accident) 01/18/2024    Priority: Medium    BPPV (benign paroxysmal positional vertigo) 12/17/2019    Priority: Medium    History of adenomatous polyp of colon 10/27/2014    Priority: Medium    Syncope 09/27/2014    Priority: Medium    Chronic kidney disease, stage 3a ([name]) 06/01/2014    Priority: Medium    Hyperlipidemia associated with type 2 diabetes mellitus ([name]) 03/01/2014    Priority: Medium    Basal cell carcinoma 05/03/2009    Priority: Medium    Essential hypertension 01/30/2007    Priority: Medium    Gout 10/28/2019    Priority: Low   Aortic  atherosclerosis 01/29/2019    Priority: Low   Stricture of sigmoid s/p robotic sigmoidectomy 11/26/2017 11/26/2017    Priority: Low   Chronic anticoagulation 11/26/2017    Priority: Low   Right inguinal hernia 10/02/2017    Priority: Low   Bowel habit changes 10/14/2014    Priority: Low   Symptomatic bradycardia 09/27/2014    Priority: Low   LEG CRAMPS, NOCTURNAL 04/30/2010    Priority: Low   Cervical spondylosis without myelopathy 12/11/2007    Priority: Low   GERD (gastroesophageal reflux disease) 11/05/2007    Priority: Low   Elevated troponin 01/18/2024   Acute metabolic encephalopathy 01/18/2024    Medications- reviewed and updated Current Outpatient Medications  Medication Sig Dispense Refill   amLODipine  (NORVASC ) 5 MG tablet Take 1 tablet (5 mg total) by mouth daily. 90 tablet 3   escitalopram  (LEXAPRO ) 5 MG tablet Take 1 tablet (5 mg total) by mouth daily. (Patient taking differently: Take 5 mg by mouth in the morning.) 90 tablet 3   furosemide  (LASIX ) 20 MG tablet Take 1 tablet (20 mg total) by mouth daily. If weight gain 3 lbs in a day or 5 lbs over 3 days or swelling worsens may take twice a day for up to 3 days 45 tablet 5   latanoprost  (XALATAN ) 0.005 % ophthalmic solution Place 1 drop into both eyes at  bedtime.   12   memantine  (NAMENDA  XR) 28 MG CP24 24 hr capsule Take 28 mg by mouth daily.     metFORMIN  (GLUCOPHAGE -XR) 500 MG 24 hr tablet TAKE 1 TABLET BY MOUTH EVERY DAY WITH BREAKFAST 90 tablet 2   rosuvastatin  (CRESTOR ) 10 MG tablet Take 1 tablet (10 mg total) by mouth daily. 90 tablet 3   valsartan  (DIOVAN ) 80 MG tablet Take 80 mg by mouth daily.     Vitamin D , Ergocalciferol , (DRISDOL ) 1.25 MG (50000 UNIT) CAPS capsule Take 1 capsule (50,000 Units total) by mouth every 7 (seven) days. 13 capsule 1   No current facility-administered medications for this visit.     Objective:  BP 128/78 (BP Location: Left Arm, Patient Position: Sitting, Cuff Size: Normal)    Pulse 62   Temp 97.9 F (36.6 C) (Temporal)   Ht 5' 11 (1.803 m)   Wt 157 lb 12.8 oz (71.6 kg)   SpO2 96%   BMI 22.01 kg/m  Gen: NAD, resting comfortably CV: RRR stable murmur Lungs: CTAB no crackles, wheeze, rhonchi Abdomen: soft/nontender/nondistended/normal bowel sounds. No rebound or guarding.  Ext: trace edema Skin: warm, dry     Assessment and Plan   #groin bulge on right side noted for about a month- lemon sized hernia on right- hed like to monitor unless begins to have pain- knows to seek care immediately if severe pain that won't relent- would call 911 even  #CHF- new diagnosis August 2025 during hospitalization #hypertension/CKD stage III S: Medication:Lasix  20 mg daily- has not needed extra lasix , amlodipine  10 mg--> 5 mg due to edema - Creatinine increased during hospitalizations on prior valsartan   -Previously added valsartan  40 mg due to microalbuminuria in March 2025 discovered in relation to prior [place] lab error- later up to 80 mg  - check kidney function today - weight up slightly but swelling minimal  CKD stage III with creatinine ranging from 1.3-1.6 for the most part. On last check asctually 1.29 and GFR was 53.  BP Readings from Last 3 Encounters:  06/30/24 128/78  04/28/24 138/78  04/15/24 104/62  A/P: CHF appears euvolemic- continue current medications. Weights roughly stable within 1-2 lbs on home scales Hypertension- well controlled continue current medications  CKD III has been stable- update levels today and also recheck the UACR to make sure at least stable- if worse could consider higher valsartan  and lowering amlodipine  further  # Moderate to severe aortic stenosis on echocardiogram 02/23/2024-patient is plugged in with cardiology. No recent dizziness or chest pain or shortness of breath   #Vascular dementia- Dr. Aneita Hanlon S: Medication: Namenda  restarted by neurology at 28 mg -Namenda  discontinued due to bradycardia risk on with  presyncopal episodes-he had been on memantine  for many years at that point- has been restarted and doing ok -Neurology started escitalopram  5 mg due to him feeling more withdrawn January 2025  A/P: dementia ongoing- continue to monitor- continue current medications - no bradycardia noted Mood changes related to this doing ok on escitalopram    #Atrial fibrillation S: medication(s): none -prior eliquis  stopped due to falsl July 2025 and intracranial hemorrhage  A/P: not on any medicine- essentially monitoring at this point   # Diabetes S: Compliant with Metformin  500 mg XR daily with breakfast Exercise and diet- his feet have led him to exercise less- working with podiatry Lab Results  Component Value Date   HGBA1C 6.7 (H) 03/25/2024   HGBA1C 6.2 11/12/2023   HGBA1C 6.2 06/11/2023   A/P:  hopefully stable- update a1c today. Continue without meds for now  -as feet get better and if warmer days intends to retrial walking   #hyperlipidemia/history of stroke-may have been atrial fibrillation related #$history of MI- demand ischemia July 2025 hospitzation with orthostatic blood pressure drops and bradycardia S: compliant with rosuvastatin  20 mg weekly--> 10 mg daily Lab Results  Component Value Date   CHOL 137 06/11/2023   HDL 42.40 06/11/2023   LDLCALC 78 06/11/2023   LDLDIRECT 87.0 11/12/2023   TRIG 84.0 06/11/2023   CHOLHDL 3 06/11/2023  A/P: lipids hopefully improved- prefer LDL under 70- may go up on dose if needed. Not on aspirin or eliquis  with prior intracranial bleed.     #Vitamin D  deficiency S: Medication: none Last vitamin D  Lab Results  Component Value Date   VD25OH 19.28 (L) 03/04/2024   A/P: recheck next visit likely- still on high dose   #history of prostate cancer - PSA very low in past but due for annual check Lab Results  Component Value Date   PSA 0.01 (L) 06/11/2023   PSA 0.01 (L) 11/28/2022   PSA 0.01 (L) 07/20/2020   Recommended follow up: Return in  about 4 months (around 10/29/2024) for physical or sooner if needed.Schedule b4 you leave. Future Appointments  Date Time Provider Department Center  06/30/2024 10:20 AM Katrinka Garnette KIDD, MD [facility]  08/19/2024  9:00 AM Dina, Camie BRAVO, PA-C [facility] None   Lab/Order associations: biscuit this am   ICD-10-CM   1. Type 2 diabetes mellitus with unspecified complications ([name])  E11.8 Comprehensive metabolic panel with GFR    CBC with Differential/Platelet    Hemoglobin A1c    Microalbumin / creatinine urine ratio    2. Hyperlipidemia associated with type 2 diabetes mellitus ([name])  E11.69 Lipid panel   E78.5     3. Essential hypertension  I10     4. Chronic heart failure with preserved ejection fraction (HFpEF) ([name])  I50.32     5. History of prostate cancer  Z85.46 PSA      No orders of the defined types were placed in this encounter.   Return precautions advised.  Garnette Katrinka, MD

## 2024-06-30 NOTE — Patient Instructions (Addendum)
 Please stop by lab before you go If you have mychart- we will send your results within 3 business days of us  receiving them.  If you do not have mychart- we will call you about results within 5 business days of us  receiving them.  *please also note that you will see labs on mychart as soon as they post. I will later go in and write notes on them- will say notes from Dr. Katrinka   No changes today unless labs lead us  to make changes  #groin bulge on right side noted for about a month- lemon sized hernia on right- hed like to monitor unless begins to have pain- knows to seek care immediately if severe pain that won't relent- would call 911 even  Recommended follow up: Return in about 4 months (around 10/29/2024) for physical or sooner if needed.Schedule b4 you leave.

## 2024-07-01 ENCOUNTER — Telehealth: Payer: Self-pay

## 2024-07-01 ENCOUNTER — Ambulatory Visit: Payer: Self-pay | Admitting: Family Medicine

## 2024-07-01 NOTE — Telephone Encounter (Signed)
 Patient's Orthotics are in.

## 2024-07-12 NOTE — Telephone Encounter (Signed)
 12/29 orthotics are in the office, lvm for pick-up

## 2024-07-14 ENCOUNTER — Encounter: Payer: Self-pay | Admitting: Podiatry

## 2024-07-14 ENCOUNTER — Ambulatory Visit (INDEPENDENT_AMBULATORY_CARE_PROVIDER_SITE_OTHER): Admitting: Podiatry

## 2024-07-14 DIAGNOSIS — M216X2 Other acquired deformities of left foot: Secondary | ICD-10-CM

## 2024-07-14 DIAGNOSIS — M2042 Other hammer toe(s) (acquired), left foot: Secondary | ICD-10-CM | POA: Diagnosis not present

## 2024-07-14 NOTE — Progress Notes (Signed)
 Subjective:   Patient ID: Calvin Burnett, male   DOB: 89 y.o.   MRN: [id number]   HPI Patient presents for orthotics left and right foot and also has discomfort in the 4th and 5th toes that he was concerned about.  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  I then advised on wider shoe gear and orthotics were fitted into his shoes properly and patient will be seen back to recheck

## 2024-08-18 NOTE — Progress Notes (Incomplete)
 "   Dementia likely due to vascular disease   Calvin Burnett is a very pleasant 89 y.o. RH male with a history of Afib on Eliquis , DM2, hyperlipidemia, HTN, CKD3, CHF, moderate to severe aortic stenosis, bradycardia, B12, D deficiency and a history of memory impairment likely of vascular etiology seen today in follow up for memory loss.  Since his last visit, the patient sustained an  L temporal SAH in July 2025   Imaging revealed multiple small foci of bilateral intracerebral hemorrhage felt to be related to cerebral amyloid angiopathy in the setting of Eliquis , which was then held.  He was discharged in stable condition.  Patient is currently on memantine  XR 28 mg daily***.   Patient was last seen on 07/31/2023***. Memory is ***. MMSE today is  /30. Patient is able to participate on ADLs and continues to drive without difficulties. Mood is*** . This patient is accompanied in the office by his daughter*** who supplements the history.  Previous records as well as any outside records available were reviewed prior to todays visit.   Follow up in  months Continue memantine  XR 28 mg daily, side effects discussed Recommend good control of cardiovascular risk factors.  He is not on Eliquis , declines [facility] as per cardiology report Continue Lexapro  5 mg daily, side effects discussed  Discussed the use of AI scribe software for clinical note transcription with the patient, who gave verbal consent to proceed.  History of Present Illness     Any changes in memory since last visit?  He has his days, but those are fewer.  He enjoys watching game shows as well as the news.  He continues to have a routine and sticking to it . He forgets to turn on his TV but can shut it off .  LTM is good  He does not like any brain games. Likes to go to [employer].  repeats oneself?  Endorsed, especially with appointments, he did not recall where he was going this morning. Disoriented when walking into a room?  Patient  denies.    Misplacing objects?  Patient denies   Wandering behavior?   denies   Any personality changes since last visit?  Denies.   Any worsening depression?: denies.   Hallucinations or paranoia?  Denies.   Seizures?   Denies.    Any sleep changes? Sleeps well, a lot . Denies vivid dreams, REM behavior or sleepwalking   Sleep apnea?   Denies.    Any hygiene concerns?   Denies.   Independent of bathing and dressing?  Endorsed  Does the patient needs help with medications? Patient is in charge, daughter monitors  Who is in charge of the finances?  Patient is in charge, daughter monitors     Any changes in appetite?  denies.  He continues to drive to [employer] where he consumes the same meals, sometimes he goes to [employer] daily and the pharmacy.  Daughter changes his routine when she can.  Patient have trouble swallowing?  Denies.   Does the patient cook?  No. Any headaches?    Denies.   Vision changes? Denies. Chronic pain?  Denies.   Ambulates with difficulty? Denies.    Recent falls or head injuries?  Denies.      Unilateral weakness, numbness or tingling? Denies.   Any tremors?  Denies.   Any anosmia?    Denies.   Any incontinence of urine?  Denies.   Any bowel dysfunction?  Denies.  Daughter lives with him.  He has great family support   . Does the patient drive?  Very short distances, denies any issues.    History on Initial Assessment 05/25/2020: This is a pleasant 89 year old right-handed man with a history of hypertension, hyperlipidemia, diabetes, atrial fibrillation on Eliquis , stroke, presenting for evaluation of vascular dementia. He is accompanied by his daughter Calvin Burnett who helps supplement the history today. He feels his memory is pretty good. Calvin Burnett started noticing short-term memory changes around 2-3 years ago, but feels that he is overall doing well independently. He lives alone. He has a good family support system with several family members locally  checking on his regularly. His wife passed away 5 years ago and he was under a lot of stress. There have been several deaths in the family and he has been through a lot. He denies getting lost driving, Calvin Burnett denies any driving concerns. He manages his own medications and finances, he and Calvin Burnett deny any issues with these. He is part of the mining engineer at church and manages things well.  He denies misplacing things frequently, Calvin Burnett reports he rarely misplaces things but gets really hard on himself when he loses things. He denies any word-finding difficulties. He does not cook much and denies leaving the stove on. He is independent with dressing and bathing, Calvin Burnett denies any hygiene concerns. His brother is his POA. No family history of dementia. He denies any significant head injuries or alcohol  use. Notes reviewed, he was started on Namenda  by Dr. Mavis in 2015. MMSE 26/30 in 2017, 21/30 in 2019.  He denies any headaches, dizziness, diplopia, dysarthria, dysphagia, neck pain, focal numbness/tingling/weakness, bowel/bladder dysfunction. No anosmia, tremors, no falls.  He walks a mile a day. He has some back pain today, right leg feels different. Sleep is good. No personality changes, paranoia or hallucinations. He denies any known prior history of stroke. Calvin Burnett reports that he passed out while at [place], and this is when he had a stroke. EPIC notes reviewed, he had a syncopal episode in 2016 due to bradycardia. CT head at that time did not show any evidence of stroke. There was diffuse atrophy and chronic microvascular disease. He had an MRI brain with and without contrast in 11/2017 for memory loss, confusion. There was note of a remote right temporal lobe infarct with encephalomalacia that was not seen in 2016 imaging.  I personally reviewed MRI brain without contrast done 12/2019 which did not show any acute changes. There was a large remote right temporal infarct, inferior division MCA territory,  diffuse atrophy and mild chronic microvascular disease. There were remote hemorrhages along the right temporal occipital convexity without generalized chronic lobar hemorrhage, possible mild gliosis in the parasagittal or parietal lobes adjacent to the inferior falx.   MRI of the brain May 2024 without acute findings, chronic right temporal lobe infarct cerebral atrophy, mild chronic ischemic changes, remote hemorrhages, without progression.  01/18/2024 MRI brain with and without contrast, personally reviewed 1. Multiple scattered foci of susceptibility artifact involving the bilateral cerebral hemispheres, consistent with micro hemorrhages. A few of these appear to correspond with hyperdensities seen on prior head CT, consistent with small acute bleeds. Additionally, there is abnormal sulcal FLAIR signal intensity with hemosiderin staining within the left temporoccipital region, concerning for underlying subarachnoid hemorrhage, suspected to be subacute in nature as no hyperdense subarachnoid blood seen on prior head CT. Findings are nonspecific, with primary differential considerations including sequelae of cerebral amyloid angiopathy, trauma,  or coagulopathy. 2. Asymmetric leptomeningeal enhancement within the left temporoccipital region, likely reactive in nature due to the underlying subarachnoid findings at these locations. 3. No other acute intracranial abnormality. 4. Chronic right MCA distribution infarct, with additional small remote left frontal cortical infarct.   CTA head and neck unremarkable, Widespread atherosclerosis in the head and neck, but no hemodynamically significant arterial stenosis.   PREVIOUS MEDICATIONS: Donepezil (bradycardia)          07/31/2023    9:00 AM 02/06/2023   12:00 PM 02/14/2022   12:00 PM  MMSE - Mini Mental State Exam  Orientation to time 1 1 1   Orientation to Place 5 3 5   Registration 3 3 3   Attention/ Calculation 4 5 3   Recall 0 0 0  Language-  name 2 objects 2 2 2   Language- repeat 1 1 1   Language- follow 3 step command 3 3 3   Language- read & follow direction 1 1 1   Write a sentence 1 1 1   Copy design 0 0 0  Total score 21 20 20       05/25/2020   10:00 AM  Montreal Cognitive Assessment   Visuospatial/ Executive (0/5) 3  Naming (0/3) 2  Attention: Read list of digits (0/2) 2  Attention: Read list of letters (0/1) 1  Attention: Serial 7 subtraction starting at 100 (0/3) 2  Language: Repeat phrase (0/2) 2  Language : Fluency (0/1) 1  Abstraction (0/2) 2  Delayed Recall (0/5) 0  Orientation (0/6) 4  Total 19  Adjusted Score (based on education) 20      Objective:    Neurological Exam:    VITALS:  There were no vitals filed for this visit.  GEN:  The patient appears stated age and is in NAD. HEENT:  Normocephalic, atraumatic.   Neurological examination:  General: NAD, well-groomed, appears stated age. Orientation: The patient is alert. Oriented to person, place and not to date Cranial nerves: There is good facial symmetry.The speech is less fluent than prior, but clear.  No aphasia or dysarthria. Fund of knowledge is appropriate. Recent and remote memory are impaired. Attention and concentration are reduced. Able to name objects and repeat phrases.  Hearing is intact to conversational tone. *** Sensation: Sensation is intact to light touch throughout Motor: Strength is at least antigravity x4. DTR's 2/4 in UE/LE     Movement examination:  Tone: There is normal tone in the UE/LE Abnormal movements:  no tremor.  No myoclonus.  No asterixis.   Coordination:  There is no decremation with RAM's. Normal finger to nose  Gait and Station: The patient has no*** difficulty arising out of a deep-seated chair without the use of the hands. The patient's stride length is good.  Gait is cautious and narrow.    Thank you for allowing us  the opportunity to participate in the care of this nice patient. Please do not  hesitate to contact us  for any questions or concerns.   Total time spent on today's visit was *** minutes dedicated to this patient today, preparing to see patient, examining the patient, ordering tests and/or medications and counseling the patient, documenting clinical information in the EHR or other health record, independently interpreting results and communicating results to the patient/family, discussing treatment and goals, answering patient's questions and coordinating care.  Cc:  Katrinka Garnette KIDD, MD  Camie Sevin 08/18/2024 5:41 AM      "

## 2024-08-19 ENCOUNTER — Ambulatory Visit: Admitting: Physician Assistant

## 2024-11-04 ENCOUNTER — Ambulatory Visit: Admitting: Family Medicine
